# Patient Record
Sex: Male | Born: 1955 | Race: White | Hispanic: No | Marital: Married | Smoking: Former smoker
Health system: Southern US, Community
[De-identification: ages and names within clinical notes are randomized; demographics above are authoritative.]

## PROBLEM LIST (undated history)

## (undated) DIAGNOSIS — K219 Gastro-esophageal reflux disease without esophagitis: Secondary | ICD-10-CM

## (undated) DIAGNOSIS — T8859XA Other complications of anesthesia, initial encounter: Secondary | ICD-10-CM

## (undated) DIAGNOSIS — E785 Hyperlipidemia, unspecified: Secondary | ICD-10-CM

## (undated) DIAGNOSIS — Z85828 Personal history of other malignant neoplasm of skin: Secondary | ICD-10-CM

## (undated) DIAGNOSIS — I499 Cardiac arrhythmia, unspecified: Secondary | ICD-10-CM

## (undated) DIAGNOSIS — E7800 Pure hypercholesterolemia, unspecified: Secondary | ICD-10-CM

## (undated) DIAGNOSIS — M5416 Radiculopathy, lumbar region: Secondary | ICD-10-CM

## (undated) DIAGNOSIS — M47814 Spondylosis without myelopathy or radiculopathy, thoracic region: Secondary | ICD-10-CM

## (undated) DIAGNOSIS — J309 Allergic rhinitis, unspecified: Secondary | ICD-10-CM

## (undated) DIAGNOSIS — M47817 Spondylosis without myelopathy or radiculopathy, lumbosacral region: Secondary | ICD-10-CM

## (undated) DIAGNOSIS — I10 Essential (primary) hypertension: Secondary | ICD-10-CM

## (undated) DIAGNOSIS — F329 Major depressive disorder, single episode, unspecified: Secondary | ICD-10-CM

## (undated) DIAGNOSIS — Z8489 Family history of other specified conditions: Secondary | ICD-10-CM

## (undated) DIAGNOSIS — D649 Anemia, unspecified: Secondary | ICD-10-CM

## (undated) DIAGNOSIS — M25579 Pain in unspecified ankle and joints of unspecified foot: Secondary | ICD-10-CM

## (undated) DIAGNOSIS — L570 Actinic keratosis: Secondary | ICD-10-CM

## (undated) DIAGNOSIS — R1084 Generalized abdominal pain: Secondary | ICD-10-CM

## (undated) DIAGNOSIS — M51369 Other intervertebral disc degeneration, lumbar region without mention of lumbar back pain or lower extremity pain: Secondary | ICD-10-CM

## (undated) DIAGNOSIS — R748 Abnormal levels of other serum enzymes: Secondary | ICD-10-CM

## (undated) DIAGNOSIS — I679 Cerebrovascular disease, unspecified: Secondary | ICD-10-CM

## (undated) DIAGNOSIS — K2100 Gastro-esophageal reflux disease with esophagitis, without bleeding: Secondary | ICD-10-CM

## (undated) DIAGNOSIS — G7112 Myotonia congenita: Secondary | ICD-10-CM

## (undated) DIAGNOSIS — Z860101 Personal history of adenomatous and serrated colon polyps: Secondary | ICD-10-CM

## (undated) DIAGNOSIS — F1011 Alcohol abuse, in remission: Secondary | ICD-10-CM

## (undated) DIAGNOSIS — I471 Supraventricular tachycardia, unspecified: Secondary | ICD-10-CM

## (undated) DIAGNOSIS — F419 Anxiety disorder, unspecified: Secondary | ICD-10-CM

## (undated) DIAGNOSIS — E119 Type 2 diabetes mellitus without complications: Secondary | ICD-10-CM

## (undated) DIAGNOSIS — M1990 Unspecified osteoarthritis, unspecified site: Secondary | ICD-10-CM

## (undated) DIAGNOSIS — M47812 Spondylosis without myelopathy or radiculopathy, cervical region: Secondary | ICD-10-CM

## (undated) DIAGNOSIS — C801 Malignant (primary) neoplasm, unspecified: Secondary | ICD-10-CM

## (undated) HISTORY — PX: NOSE SURGERY: SHX723

## (undated) HISTORY — PX: BACK SURGERY: SHX140

## (undated) HISTORY — DX: Supraventricular tachycardia, unspecified: I47.10

## (undated) HISTORY — DX: Essential (primary) hypertension: I10

## (undated) HISTORY — DX: Malignant (primary) neoplasm, unspecified: C80.1

## (undated) HISTORY — DX: Abnormal levels of other serum enzymes: R74.8

## (undated) HISTORY — DX: Spondylosis without myelopathy or radiculopathy, thoracic region: M47.814

## (undated) HISTORY — DX: Anemia, unspecified: D64.9

## (undated) HISTORY — DX: Spondylosis without myelopathy or radiculopathy, cervical region: M47.812

## (undated) HISTORY — DX: Other intervertebral disc degeneration, lumbar region without mention of lumbar back pain or lower extremity pain: M51.369

## (undated) HISTORY — DX: Generalized abdominal pain: R10.84

## (undated) HISTORY — DX: Cerebrovascular disease, unspecified: I67.9

## (undated) HISTORY — DX: Radiculopathy, lumbar region: M54.16

## (undated) HISTORY — DX: Personal history of other malignant neoplasm of skin: Z85.828

## (undated) HISTORY — DX: Cardiac arrhythmia, unspecified: I49.9

## (undated) HISTORY — DX: Hyperlipidemia, unspecified: E78.5

## (undated) HISTORY — DX: Anxiety disorder, unspecified: F41.9

## (undated) HISTORY — DX: Major depressive disorder, single episode, unspecified: F32.9

## (undated) HISTORY — DX: Gastro-esophageal reflux disease without esophagitis: K21.9

## (undated) HISTORY — DX: Type 2 diabetes mellitus without complications: E11.9

## (undated) HISTORY — DX: Unspecified osteoarthritis, unspecified site: M19.90

## (undated) HISTORY — DX: Spondylosis without myelopathy or radiculopathy, lumbosacral region: M47.817

## (undated) HISTORY — DX: Allergic rhinitis, unspecified: J30.9

## (undated) HISTORY — DX: Pure hypercholesterolemia, unspecified: E78.00

## (undated) HISTORY — DX: Pain in unspecified ankle and joints of unspecified foot: M25.579

## (undated) HISTORY — DX: Alcohol abuse, in remission: F10.11

## (undated) HISTORY — DX: Personal history of adenomatous and serrated colon polyps: Z86.0101

## (undated) HISTORY — PX: CARPAL TUNNEL RELEASE: SHX101

## (undated) HISTORY — DX: Gastro-esophageal reflux disease with esophagitis, without bleeding: K21.00

## (undated) HISTORY — DX: Actinic keratosis: L57.0

---

## 2015-07-31 DIAGNOSIS — M48061 Spinal stenosis, lumbar region without neurogenic claudication: Secondary | ICD-10-CM

## 2015-07-31 HISTORY — DX: Spinal stenosis, lumbar region without neurogenic claudication: M48.061

## 2015-08-30 DIAGNOSIS — G7112 Myotonia congenita: Secondary | ICD-10-CM

## 2015-08-30 HISTORY — DX: Myotonia congenita: G71.12

## 2015-12-26 ENCOUNTER — Ambulatory Visit (INDEPENDENT_AMBULATORY_CARE_PROVIDER_SITE_OTHER): Payer: No Typology Code available for payment source | Admitting: Orthopaedic Surgery

## 2015-12-26 DIAGNOSIS — M1712 Unilateral primary osteoarthritis, left knee: Secondary | ICD-10-CM | POA: Diagnosis not present

## 2015-12-26 DIAGNOSIS — M1711 Unilateral primary osteoarthritis, right knee: Secondary | ICD-10-CM | POA: Diagnosis not present

## 2016-01-03 ENCOUNTER — Ambulatory Visit (INDEPENDENT_AMBULATORY_CARE_PROVIDER_SITE_OTHER): Payer: No Typology Code available for payment source | Admitting: Orthopaedic Surgery

## 2016-01-03 ENCOUNTER — Encounter (INDEPENDENT_AMBULATORY_CARE_PROVIDER_SITE_OTHER): Payer: Self-pay | Admitting: Orthopaedic Surgery

## 2016-01-03 DIAGNOSIS — M1712 Unilateral primary osteoarthritis, left knee: Secondary | ICD-10-CM | POA: Diagnosis not present

## 2016-01-03 DIAGNOSIS — M170 Bilateral primary osteoarthritis of knee: Secondary | ICD-10-CM

## 2016-01-03 DIAGNOSIS — M1711 Unilateral primary osteoarthritis, right knee: Secondary | ICD-10-CM

## 2016-01-03 HISTORY — DX: Bilateral primary osteoarthritis of knee: M17.0

## 2016-01-03 MED ORDER — SODIUM HYALURONATE (VISCOSUP) 20 MG/2ML IX SOSY
20.0000 mg | PREFILLED_SYRINGE | INTRA_ARTICULAR | Status: AC | PRN
  Administered 2016-01-03: 20 mg via INTRA_ARTICULAR

## 2016-01-03 MED ORDER — LIDOCAINE HCL 1 % IJ SOLN
1.0000 mL | INTRAMUSCULAR | Status: AC | PRN
  Administered 2016-01-03: 1 mL

## 2016-01-03 NOTE — Progress Notes (Signed)
Office Visit Note   Patient: Danny Hampton           Date of Birth: [birth year]           MRN: [id number] Visit Date: 01/03/2016              Requested by: No referring provider defined for this encounter. PCP: No primary care provider on file.   Assessment & Plan: Visit Diagnoses:  1. Bilateral primary osteoarthritis of knee     Plan: Return 1 week for third Euflex injection right and left knee  Follow-Up Instructions: Return in about 1 week (around 01/10/2016).   Orders:  No orders of the defined types were placed in this encounter.  No orders of the defined types were placed in this encounter.     Procedures: Large Joint Inj Date/Time: 01/03/2016 10:51 AM Performed by: Marybelle Killings Authorized by: Rodell Perna C   Consent Given by:  Patient Indications:  Pain and joint swelling Location:  Knee Site:  R knee Needle Size:  22 G Needle Length:  1.5 inches Approach:  Anterolateral Ultrasound Guidance: No   Fluoroscopic Guidance: No   Arthrogram: No Medications:  20 mg Sodium Hyaluronate 20 MG/2ML; 1 mL lidocaine 1 % Aspiration Attempted: No   Patient tolerance:  Patient tolerated the procedure well with no immediate complications Large Joint Inj Date/Time: 01/03/2016 10:52 AM Performed by: Marybelle Killings Authorized by: Rodell Perna C   Consent Given by:  Patient Indications:  Pain and joint swelling Location:  Knee Site:  L knee Needle Size:  22 G Needle Length:  1.5 inches Approach:  Anterolateral Ultrasound Guidance: No   Fluoroscopic Guidance: No   Arthrogram: No Medications:  1 mL lidocaine 1 %; 20 mg Sodium Hyaluronate 20 MG/2ML Patient tolerance:  Patient tolerated the procedure well with no immediate complications     Clinical Data: No additional findings.   Subjective: Chief Complaint  Patient presents with  . Right Knee - Pain, Follow-up  . Left Knee - Pain, Follow-up    Patient returns for Euflexxa injections in bilateral knees.  Injections 8 days ago have done okay. Left knee seems a little better, but the right knee, which is the worst, is still bothering him. Patient had no complications after first injections.     Review of Systems reviewed.    Objective: Vital Signs: BP 119/82   Pulse 68   Ht 5\' 7"  (1.702 m)   Wt 165 lb (74.8 kg)   BMI 25.84 kg/m   Physical Exam  Constitutional: He is oriented to person, place, and time. He appears well-developed and well-nourished.  HENT:  Head: Normocephalic and atraumatic.  Eyes: EOM are normal. Pupils are equal, round, and reactive to light.  Neck: No tracheal deviation present. No thyromegaly present.  Cardiovascular: Normal rate.   Pulmonary/Chest: Effort normal. He has no wheezes.  Abdominal: Soft. Bowel sounds are normal.  Neurological: He is alert and oriented to person, place, and time.  Skin: Skin is warm and dry. Capillary refill takes less than 2 seconds.  Psychiatric: He has a normal mood and affect. His behavior is normal. Judgment and thought content normal.    Ortho Exam [place] effusion with crepitus with range of motion he is here for second Euflex injection which was performed  Specialty Comments:  No specialty comments available.  Imaging: No results found.   PMFS History: Patient Active Problem List   Diagnosis Date Noted  . Bilateral primary osteoarthritis of  knee 01/03/2016   Past Medical History:  Diagnosis Date  . Acid reflux   . Anxiety   . Arthritis   . Cancer ([place])    skin cancer  . Diabetes mellitus without complication ([place])   . Hypertension     No family history on file.  Past Surgical History:  Procedure Laterality Date  . CARPAL TUNNEL RELEASE    . NOSE SURGERY     Social History   Occupational History  . retired Clinical biochemist    Social History Main Topics  . Smoking status: Current Every Day Smoker    Packs/day: 0.50    Years: 30.00    Types: Cigarettes  . Smokeless tobacco: Never Used  . Alcohol use  Yes     Comment: occasional drink  . Drug use: No  . Sexual activity: Not on file

## 2016-01-10 ENCOUNTER — Ambulatory Visit (INDEPENDENT_AMBULATORY_CARE_PROVIDER_SITE_OTHER): Payer: No Typology Code available for payment source | Admitting: Orthopaedic Surgery

## 2016-01-10 ENCOUNTER — Encounter (INDEPENDENT_AMBULATORY_CARE_PROVIDER_SITE_OTHER): Payer: Self-pay | Admitting: Orthopaedic Surgery

## 2016-01-10 VITALS — Ht 67.0 in | Wt 165.0 lb

## 2016-01-10 DIAGNOSIS — M170 Bilateral primary osteoarthritis of knee: Secondary | ICD-10-CM | POA: Diagnosis not present

## 2016-01-10 NOTE — Progress Notes (Signed)
   Office Visit Note   Patient: Danny Hampton           Date of Birth: 1955/09/19           MRN: [id number] Visit Date: 01/10/2016              Requested by: No referring provider defined for this encounter. PCP: No primary care provider on file.   Assessment & Plan: Visit Diagnoses:  1. Bilateral primary osteoarthritis of knee     Plan   Patient will return as needed seen Dr. Durward Fortes. This was his third reflex injection right and left knee which he tolerated well.  Follow-Up Instructions: Return if symptoms worsen or fail to improve.   Orders:  No orders of the defined types were placed in this encounter.  No orders of the defined types were placed in this encounter.     Procedures: No procedures performed   after a Betadine prep sterile technique bilateral knee flex injection was performed one in the right knee 1 and the left which he tolerated well.   Clinical Data: No additional findings.   Subjective: Chief Complaint  Patient presents with  . Left Knee - Pain  . Right Knee - Pain    Patient returns for bilateral Euflexxa injections #3.    Review of Systems unchanged   Objective: Vital Signs: Ht 5\' 7"  (1.702 m)   Wt 165 lb (74.8 kg)   BMI 25.84 kg/m   Physical Exam unchanged  Ortho Exam patient has mild swelling both knees were tenderness the right and left knee. He's noticed improvement in the left knee after the second reflex injection.  Specialty Comments:  No specialty comments available.  Imaging: No results found.   PMFS History: Patient Active Problem List   Diagnosis Date Noted  . Bilateral primary osteoarthritis of knee 01/03/2016   Past Medical History:  Diagnosis Date  . Acid reflux   . Anxiety   . Arthritis   . Cancer ([place])    skin cancer  . Diabetes mellitus without complication ([place])   . Hypertension     History reviewed. No pertinent family history.  Past Surgical History:  Procedure Laterality Date  . CARPAL  TUNNEL RELEASE    . NOSE SURGERY     Social History   Occupational History  . retired Clinical biochemist    Social History Main Topics  . Smoking status: Current Every Day Smoker    Packs/day: 0.50    Years: 30.00    Types: Cigarettes  . Smokeless tobacco: Never Used  . Alcohol use Yes     Comment: occasional drink  . Drug use: No  . Sexual activity: Not on file

## 2016-05-07 ENCOUNTER — Ambulatory Visit (INDEPENDENT_AMBULATORY_CARE_PROVIDER_SITE_OTHER): Payer: No Typology Code available for payment source | Admitting: Orthopaedic Surgery

## 2016-05-07 DIAGNOSIS — G8929 Other chronic pain: Secondary | ICD-10-CM | POA: Diagnosis not present

## 2016-05-07 DIAGNOSIS — M25562 Pain in left knee: Secondary | ICD-10-CM

## 2016-05-07 DIAGNOSIS — M25561 Pain in right knee: Secondary | ICD-10-CM | POA: Diagnosis not present

## 2016-05-07 MED ORDER — LIDOCAINE HCL 1 % IJ SOLN
5.0000 mL | INTRAMUSCULAR | Status: AC | PRN
  Administered 2016-05-07: 5 mL

## 2016-05-07 MED ORDER — METHYLPREDNISOLONE ACETATE 40 MG/ML IJ SUSP
80.0000 mg | INTRAMUSCULAR | Status: AC | PRN
  Administered 2016-05-07: 80 mg

## 2016-05-07 MED ORDER — BUPIVACAINE HCL 0.5 % IJ SOLN
3.0000 mL | INTRAMUSCULAR | Status: AC | PRN
  Administered 2016-05-07: 3 mL via INTRA_ARTICULAR

## 2016-05-07 NOTE — Progress Notes (Signed)
Office Visit Note   Patient: Danny Hampton           Date of Birth: Jul 29, 1955           MRN: [id number] Visit Date: 05/07/2016              Requested by: No referring provider defined for this encounter. PCP: No primary care provider on file.   Assessment & Plan: Visit Diagnoses:  1. Chronic pain of left knee   2. Chronic pain of right knee     Plan: Mr. Adem has been followed for the osteoarthritis in both knees. he's already completed a course of Visco supplementation. He's had recurrent pain and is Asking for a cortisone injection bilaterally. I will plan on injecting both knees and see him back on a when necessary basis. Follow-Up Instructions: Return if symptoms worsen or fail to improve.   Orders:  No orders of the defined types were placed in this encounter.  No orders of the defined types were placed in this encounter.     Procedures: Large Joint Inj Date/Time: 05/07/2016 12:05 PM Performed by: Garald Balding Authorized by: Garald Balding   Consent Given by:  Patient Timeout: prior to procedure the correct patient, procedure, and site was verified   Indications:  Pain and joint swelling Location:  Knee Site:  L knee Prep: patient was prepped and draped in usual sterile fashion   Needle Size:  25 G Needle Length:  1.5 inches Approach:  Anteromedial Ultrasound Guidance: No   Fluoroscopic Guidance: No   Arthrogram: No   Medications:  3 mL bupivacaine 0.5 %; 5 mL lidocaine 1 %; 80 mg methylPREDNISolone acetate 40 MG/ML Aspiration Attempted: No   Patient tolerance:  Patient tolerated the procedure well with no immediate complications    Large Joint Inj Date/Time: 05/07/2016 12:08 PM Performed by: Garald Balding Authorized by: Garald Balding   Consent Given by:  Patient Timeout: prior to procedure the correct patient, procedure, and site was verified   Indications:  Pain and joint swelling Location:  Knee Site:  R knee Prep: patient  was prepped and draped in usual sterile fashion   Needle Size:  25 G Needle Length:  1.5 inches Approach:  Anteromedial Ultrasound Guidance: No   Fluoroscopic Guidance: No   Arthrogram: No   Medications:  5 mL lidocaine 1 %; 80 mg methylPREDNISolone acetate 40 MG/ML; 3 mL bupivacaine 0.5 % Aspiration Attempted: No   Patient tolerance:  Patient tolerated the procedure well with no immediate complications     Clinical Data: No additional findings.   Subjective: No chief complaint on file. Prior diagnosis of bilateral knee osteoarthritis. A course of Visco supplementation less than in 6 months ago and is having recurrent pain and wishes a cortisone injection.  HPI  Review of Systems   Objective: Vital Signs: There were no vitals taken for this visit.  Physical Exam  Ortho Exam examination of both knees reveals full extension and flexion over 105. No evidence of instability. No popliteal pain or fullness. No calf pain. Bilateral medial joint pain which is relatively mild at this point. Some patella crepitation.  Specialty Comments:  No specialty comments available.  Imaging: No results found.   PMFS History: Patient Active Problem List   Diagnosis Date Noted  . Bilateral primary osteoarthritis of knee 01/03/2016   Past Medical History:  Diagnosis Date  . Acid reflux   . Anxiety   . Arthritis   .  Cancer ([place])    skin cancer  . Diabetes mellitus without complication ([place])   . Hypertension     No family history on file.  Past Surgical History:  Procedure Laterality Date  . CARPAL TUNNEL RELEASE    . NOSE SURGERY     Social History   Occupational History  . retired Clinical biochemist    Social History Main Topics  . Smoking status: Current Every Day Smoker    Packs/day: 0.50    Years: 30.00    Types: Cigarettes  . Smokeless tobacco: Never Used  . Alcohol use Yes     Comment: occasional drink  . Drug use: No  . Sexual activity: Not on file

## 2016-05-14 ENCOUNTER — Ambulatory Visit (INDEPENDENT_AMBULATORY_CARE_PROVIDER_SITE_OTHER): Payer: No Typology Code available for payment source | Admitting: Orthopaedic Surgery

## 2016-06-18 ENCOUNTER — Ambulatory Visit (INDEPENDENT_AMBULATORY_CARE_PROVIDER_SITE_OTHER): Payer: No Typology Code available for payment source | Admitting: Orthopaedic Surgery

## 2016-06-18 ENCOUNTER — Encounter (INDEPENDENT_AMBULATORY_CARE_PROVIDER_SITE_OTHER): Payer: Self-pay | Admitting: Orthopaedic Surgery

## 2016-06-18 VITALS — BP 129/77 | HR 70 | Ht 67.0 in | Wt 170.0 lb

## 2016-06-18 DIAGNOSIS — M1711 Unilateral primary osteoarthritis, right knee: Secondary | ICD-10-CM

## 2016-06-18 DIAGNOSIS — G8929 Other chronic pain: Secondary | ICD-10-CM

## 2016-06-18 DIAGNOSIS — M1712 Unilateral primary osteoarthritis, left knee: Secondary | ICD-10-CM | POA: Diagnosis not present

## 2016-06-18 DIAGNOSIS — M25562 Pain in left knee: Principal | ICD-10-CM

## 2016-06-18 DIAGNOSIS — M25561 Pain in right knee: Principal | ICD-10-CM

## 2016-06-18 MED ORDER — SODIUM HYALURONATE (VISCOSUP) 20 MG/2ML IX SOSY
20.0000 mg | PREFILLED_SYRINGE | INTRA_ARTICULAR | Status: AC | PRN
  Administered 2016-06-18: 20 mg via INTRA_ARTICULAR

## 2016-06-18 NOTE — Progress Notes (Signed)
Office Visit Note   Patient: Danny Hampton           Date of Birth: Jul 09, 1955           MRN: [id number] Visit Date: 06/18/2016              Requested by: No referring provider defined for this encounter. PCP: No PCP Per Patient   Assessment & Plan: Visit Diagnoses:  1. Chronic pain of both knees   Prior diagnosis with cortisone and Visco supplementation over 6 months ago we will restart Euflexxa today  Plan: Return in 1 week for second Euflexxa injections both knees  Follow-Up Instructions: Return in about 1 week (around 06/25/2016).   Orders:  No orders of the defined types were placed in this encounter.  No orders of the defined types were placed in this encounter.     Procedures: Large Joint Inj Date/Time: 06/18/2016 2:13 PM Performed by: Garald Balding Authorized by: Garald Balding   Consent Given by:  Patient Timeout: prior to procedure the correct patient, procedure, and site was verified   Indications:  Pain and joint swelling Location:  Knee Site:  L knee Prep: patient was prepped and draped in usual sterile fashion   Needle Size:  25 G Needle Length:  1.5 inches Approach:  Anteromedial Ultrasound Guidance: No   Fluoroscopic Guidance: No   Arthrogram: No   Medications:  20 mg Sodium Hyaluronate 20 MG/2ML Aspiration Attempted: No   Patient tolerance:  Patient tolerated the procedure well with no immediate complications  Large Joint Inj Date/Time: 06/18/2016 2:13 PM Performed by: Garald Balding Authorized by: Garald Balding   Consent Given by:  Patient Timeout: prior to procedure the correct patient, procedure, and site was verified   Indications:  Pain and joint swelling Location:  Knee Site:  R knee Prep: patient was prepped and draped in usual sterile fashion   Needle Size:  25 G Needle Length:  1.5 inches Approach:  Anteromedial Ultrasound Guidance: No   Fluoroscopic Guidance: No   Arthrogram: No   Medications:  20 mg Sodium  Hyaluronate 20 MG/2ML Aspiration Attempted: No   Patient tolerance:  Patient tolerated the procedure well with no immediate complications     Clinical Data: No additional findings.   Subjective: Chief Complaint  Patient presents with  . Right Knee - Injections    euflexxa #1  . Left Knee - Injections    Euflexxa #1  Mr. Fackler has been approved for bilateral Euflexxa injections both knees. He's had a prior course over 6 months ago with good relief of his pain. Denies fever or chills or recent injury or trauma.  HPI  Review of Systems   Objective: Vital Signs: BP 129/77   Pulse 70   Ht 5\' 7"  (1.702 m)   Wt 170 lb (77.1 kg)   BMI 26.63 kg/m   Physical Exam  Ortho Exam mild medial joint pain both knees. Full extension. Neither knee was hot warm or red. No effusion. Flexion over 110 without instability. No calf pain no swelling distally  Specialty Comments:  No specialty comments available.  Imaging: No results found.   PMFS History: Patient Active Problem List   Diagnosis Date Noted  . Bilateral primary osteoarthritis of knee 01/03/2016   Past Medical History:  Diagnosis Date  . Acid reflux   . Anxiety   . Arthritis   . Cancer ([place])    skin cancer  . Diabetes mellitus without complication (  [place])   . Hypertension     History reviewed. No pertinent family history.  Past Surgical History:  Procedure Laterality Date  . CARPAL TUNNEL RELEASE    . NOSE SURGERY     Social History   Occupational History  . retired Clinical biochemist    Social History Main Topics  . Smoking status: Current Every Day Smoker    Packs/day: 0.50    Years: 30.00    Types: Cigarettes  . Smokeless tobacco: Never Used  . Alcohol use Yes     Comment: occasional drink  . Drug use: No  . Sexual activity: Not on file

## 2016-06-25 ENCOUNTER — Ambulatory Visit (INDEPENDENT_AMBULATORY_CARE_PROVIDER_SITE_OTHER): Payer: No Typology Code available for payment source | Admitting: Orthopaedic Surgery

## 2016-06-25 ENCOUNTER — Encounter (INDEPENDENT_AMBULATORY_CARE_PROVIDER_SITE_OTHER): Payer: Self-pay | Admitting: Orthopaedic Surgery

## 2016-06-25 VITALS — BP 121/78 | HR 78 | Ht 68.0 in | Wt 165.0 lb

## 2016-06-25 DIAGNOSIS — M25562 Pain in left knee: Principal | ICD-10-CM

## 2016-06-25 DIAGNOSIS — G8929 Other chronic pain: Secondary | ICD-10-CM

## 2016-06-25 DIAGNOSIS — M1712 Unilateral primary osteoarthritis, left knee: Secondary | ICD-10-CM

## 2016-06-25 DIAGNOSIS — M1711 Unilateral primary osteoarthritis, right knee: Secondary | ICD-10-CM

## 2016-06-25 DIAGNOSIS — M25561 Pain in right knee: Principal | ICD-10-CM

## 2016-06-25 MED ORDER — SODIUM HYALURONATE (VISCOSUP) 20 MG/2ML IX SOSY
20.0000 mg | PREFILLED_SYRINGE | INTRA_ARTICULAR | Status: AC | PRN
  Administered 2016-06-25: 20 mg via INTRA_ARTICULAR

## 2016-06-25 MED ORDER — LIDOCAINE HCL 1 % IJ SOLN
3.0000 mL | INTRAMUSCULAR | Status: AC | PRN
  Administered 2016-06-25: 3 mL

## 2016-06-25 NOTE — Progress Notes (Signed)
Office Visit Note   Patient: Danny Hampton           Date of Birth: September 19, 1955           MRN: [id number] Visit Date: 06/25/2016              Requested by: No referring provider defined for this encounter. PCP: No PCP Per Patient   Assessment & Plan: Visit Diagnoses:  1. Chronic pain of both knees    Chronic osteoarthritis both knees Plan: Second Euflexxa injections both knees. Return in 1 week, complete series  Follow-Up Instructions: Return in about 1 week (around 07/02/2016).   Orders:  No orders of the defined types were placed in this encounter.  No orders of the defined types were placed in this encounter.     Procedures: Large Joint Inj Date/Time: 06/25/2016 11:34 AM Performed by: Garald Balding Authorized by: Garald Balding   Consent Given by:  Patient Timeout: prior to procedure the correct patient, procedure, and site was verified   Indications:  Pain and joint swelling Location:  Knee Site:  L knee Prep: patient was prepped and draped in usual sterile fashion   Needle Size:  25 G Needle Length:  1.5 inches Approach:  Anteromedial Ultrasound Guidance: No   Fluoroscopic Guidance: No   Arthrogram: No   Medications:  20 mg Sodium Hyaluronate 20 MG/2ML; 3 mL lidocaine 1 % Aspiration Attempted: No   Patient tolerance:  Patient tolerated the procedure well with no immediate complications  Large Joint Inj Date/Time: 06/25/2016 11:34 AM Performed by: Garald Balding Authorized by: Garald Balding   Consent Given by:  Patient Timeout: prior to procedure the correct patient, procedure, and site was verified   Indications:  Pain and joint swelling Location:  Knee Site:  R knee Prep: patient was prepped and draped in usual sterile fashion   Needle Size:  25 G Needle Length:  1.5 inches Approach:  Anteromedial Ultrasound Guidance: No   Fluoroscopic Guidance: No   Arthrogram: No   Medications:  3 mL lidocaine 1 %; 20 mg Sodium Hyaluronate 20  MG/2ML Aspiration Attempted: No   Patient tolerance:  Patient tolerated the procedure well with no immediate complications     Clinical Data: No additional findings.   Subjective: Chief Complaint  Patient presents with  . Left Knee - Injections    #2 Euflexxa  . Right Knee - Injections    #2 Euflexxa   Mr. Crean relates that he's not had any problem with either knee from the first Euflexxa injection last week HPI  Review of Systems   Objective: Vital Signs: BP 121/78   Pulse 78   Ht 5\' 8"  (1.727 m)   Wt 165 lb (74.8 kg)   BMI 25.09 kg/m   Physical Exam  Ortho Exam both knees without evidence of effusion. Neither is hot warm red or swollen. No localized areas of tenderness.  Specialty Comments:  No specialty comments available.  Imaging: No results found.   PMFS History: Patient Active Problem List   Diagnosis Date Noted  . Bilateral primary osteoarthritis of knee 01/03/2016   Past Medical History:  Diagnosis Date  . Acid reflux   . Anxiety   . Arthritis   . Cancer ([place])    skin cancer  . Diabetes mellitus without complication ([place])   . Hypertension     No family history on file.  Past Surgical History:  Procedure Laterality Date  . CARPAL TUNNEL RELEASE    .  NOSE SURGERY     Social History   Occupational History  . retired Clinical biochemist    Social History Main Topics  . Smoking status: Current Every Day Smoker    Packs/day: 0.50    Years: 30.00    Types: Cigarettes  . Smokeless tobacco: Never Used  . Alcohol use Yes     Comment: occasional drink  . Drug use: No  . Sexual activity: Not on file

## 2016-07-02 ENCOUNTER — Ambulatory Visit (INDEPENDENT_AMBULATORY_CARE_PROVIDER_SITE_OTHER): Payer: No Typology Code available for payment source | Admitting: Orthopaedic Surgery

## 2016-07-02 DIAGNOSIS — M1712 Unilateral primary osteoarthritis, left knee: Secondary | ICD-10-CM

## 2016-07-02 DIAGNOSIS — M1711 Unilateral primary osteoarthritis, right knee: Secondary | ICD-10-CM | POA: Diagnosis not present

## 2016-07-02 DIAGNOSIS — G8929 Other chronic pain: Secondary | ICD-10-CM | POA: Diagnosis not present

## 2016-07-02 DIAGNOSIS — M25562 Pain in left knee: Principal | ICD-10-CM

## 2016-07-02 DIAGNOSIS — M25561 Pain in right knee: Principal | ICD-10-CM

## 2016-07-02 MED ORDER — LIDOCAINE HCL 1 % IJ SOLN
3.0000 mL | INTRAMUSCULAR | Status: AC | PRN
  Administered 2016-07-02: 3 mL

## 2016-07-02 MED ORDER — SODIUM HYALURONATE (VISCOSUP) 20 MG/2ML IX SOSY
20.0000 mg | PREFILLED_SYRINGE | INTRA_ARTICULAR | Status: AC | PRN
  Administered 2016-07-02: 20 mg via INTRA_ARTICULAR

## 2016-07-02 NOTE — Progress Notes (Signed)
Office Visit Note   Patient: Danny Hampton           Date of Birth: Jun 27, 1955           MRN: [id number] Visit Date: 07/02/2016              Requested by: No referring provider defined for this encounter. PCP: No PCP Per Patient   Assessment & Plan: Visit Diagnoses:  1. Chronic pain of both knees    Osteo arthritis both knees Plan: Third Euflexxa injection in both knees to complete the series  Follow-Up Instructions: Return if symptoms worsen or fail to improve.   Orders:  No orders of the defined types were placed in this encounter.  No orders of the defined types were placed in this encounter.     Procedures: Large Joint Inj Date/Time: 07/02/2016 2:14 PM Performed by: Garald Balding Authorized by: Garald Balding   Consent Given by:  Patient Timeout: prior to procedure the correct patient, procedure, and site was verified   Indications:  Pain and joint swelling Location:  Knee Site:  L knee Prep: patient was prepped and draped in usual sterile fashion   Needle Size:  25 G Needle Length:  1.5 inches Approach:  Anteromedial Ultrasound Guidance: No   Fluoroscopic Guidance: No   Arthrogram: No   Medications:  20 mg Sodium Hyaluronate 20 MG/2ML; 3 mL lidocaine 1 % Aspiration Attempted: No   Patient tolerance:  Patient tolerated the procedure well with no immediate complications  Large Joint Inj Date/Time: 07/02/2016 2:15 PM Performed by: Garald Balding Authorized by: Garald Balding   Consent Given by:  Patient Timeout: prior to procedure the correct patient, procedure, and site was verified   Indications:  Pain and joint swelling Location:  Knee Site:  R knee Prep: patient was prepped and draped in usual sterile fashion   Needle Size:  25 G Needle Length:  1.5 inches Approach:  Anteromedial Ultrasound Guidance: No   Fluoroscopic Guidance: No   Arthrogram: No   Medications:  3 mL lidocaine 1 %; 20 mg Sodium Hyaluronate 20 MG/2ML Aspiration  Attempted: No   Patient tolerance:  Patient tolerated the procedure well with no immediate complications     Clinical Data: No additional findings.   Subjective: Chief Complaint  Patient presents with  . Left Knee - Injections    Mr. Lich is feeling much better since #2 euflexxa injection  . Right Knee - Injections    HPI  Review of Systems   Objective: Vital Signs: There were no vitals taken for this visit.  Physical Exam  Ortho Exam no effusion either knee. Full extension and flexion. No limp. No distal edema. Skin intact  Specialty Comments:  No specialty comments available.  Imaging: No results found.   PMFS History: Patient Active Problem List   Diagnosis Date Noted  . Bilateral primary osteoarthritis of knee 01/03/2016   Past Medical History:  Diagnosis Date  . Acid reflux   . Anxiety   . Arthritis   . Cancer ([place])    skin cancer  . Diabetes mellitus without complication ([place])   . Hypertension     No family history on file.  Past Surgical History:  Procedure Laterality Date  . CARPAL TUNNEL RELEASE    . NOSE SURGERY     Social History   Occupational History  . retired Clinical biochemist    Social History Main Topics  . Smoking status: Current Every Day Smoker  Packs/day: 0.50    Years: 30.00    Types: Cigarettes  . Smokeless tobacco: Never Used  . Alcohol use Yes     Comment: occasional drink  . Drug use: No  . Sexual activity: Not on file     Garald Balding, MD   Note - This record has been created using [employer].  Chart creation errors have been sought, but may not always  have been located. Such creation errors do not reflect on  the standard of medical care.

## 2016-07-02 NOTE — Progress Notes (Signed)
   Procedure Note  Patient: Danny Hampton             Date of Birth: December 17, 1955           MRN: [id number]             Visit Date: 07/02/2016  Procedures: Visit Diagnoses: Chronic pain of both knees  No procedures performed

## 2016-11-19 ENCOUNTER — Ambulatory Visit (INDEPENDENT_AMBULATORY_CARE_PROVIDER_SITE_OTHER): Payer: No Typology Code available for payment source | Admitting: Orthopedic Surgery

## 2016-11-19 ENCOUNTER — Encounter (INDEPENDENT_AMBULATORY_CARE_PROVIDER_SITE_OTHER): Payer: Self-pay | Admitting: Orthopedic Surgery

## 2016-11-19 VITALS — BP 120/78 | HR 113 | Resp 14 | Ht 67.0 in | Wt 175.0 lb

## 2016-11-19 DIAGNOSIS — M1712 Unilateral primary osteoarthritis, left knee: Secondary | ICD-10-CM

## 2016-11-19 DIAGNOSIS — M1711 Unilateral primary osteoarthritis, right knee: Secondary | ICD-10-CM | POA: Diagnosis not present

## 2016-11-19 MED ORDER — METHYLPREDNISOLONE ACETATE 40 MG/ML IJ SUSP
80.0000 mg | INTRAMUSCULAR | Status: AC | PRN
  Administered 2016-11-19: 80 mg

## 2016-11-19 MED ORDER — BUPIVACAINE HCL 0.5 % IJ SOLN
3.0000 mL | INTRAMUSCULAR | Status: AC | PRN
  Administered 2016-11-19: 3 mL via INTRA_ARTICULAR

## 2016-11-19 MED ORDER — LIDOCAINE HCL 1 % IJ SOLN
3.0000 mL | INTRAMUSCULAR | Status: AC | PRN
  Administered 2016-11-19: 3 mL

## 2016-11-19 NOTE — Progress Notes (Addendum)
Office Visit Note   Patient: Danny Hampton           Date of Birth: 04/17/1955           MRN: [id number] Visit Date: 11/19/2016              Requested by: No referring provider defined for this encounter. PCP: Patient, No Pcp Per   Assessment & Plan: Visit Diagnoses:  1. Unilateral primary osteoarthritis, left knee   2. Unilateral primary osteoarthritis, right knee     Plan:  #1: Corticosteroid injection to both knees, she dramatically #2: Told him to check his glucose several times today and if elevated then we'll need to cause medical doctor. He does have metformin at home.  Follow-Up Instructions: Return if symptoms worsen or fail to improve.   Orders:  No orders of the defined types were placed in this encounter.  No orders of the defined types were placed in this encounter.     Procedures: Large Joint Inj Date/Time: 11/19/2016 4:01 PM Performed by: Biagio Borg D Authorized by: Biagio Borg D   Consent Given by:  Patient Timeout: prior to procedure the correct patient, procedure, and site was verified   Indications:  Pain and joint swelling Location:  Knee Site:  L knee Prep: patient was prepped and draped in usual sterile fashion   Needle Size:  25 G Needle Length:  1.5 inches Approach:  Anteromedial Ultrasound Guidance: No   Fluoroscopic Guidance: No   Arthrogram: No   Medications:  80 mg methylPREDNISolone acetate 40 MG/ML; 3 mL bupivacaine 0.5 %; 3 mL lidocaine 1 % Aspiration Attempted: No   Patient tolerance:  Patient tolerated the procedure well with no immediate complications Large Joint Inj Date/Time: 11/19/2016 4:02 PM Performed by: Biagio Borg D Authorized by: Biagio Borg D   Consent Given by:  Patient Timeout: prior to procedure the correct patient, procedure, and site was verified   Indications:  Pain and joint swelling Location:  Knee Site:  R knee Prep: patient was prepped and draped in usual sterile fashion   Needle Size:   25 G Needle Length:  1.5 inches Approach:  Anteromedial Ultrasound Guidance: No   Fluoroscopic Guidance: No   Arthrogram: No   Medications:  80 mg methylPREDNISolone acetate 40 MG/ML; 3 mL bupivacaine 0.5 %; 3 mL lidocaine 1 % Aspiration Attempted: No   Patient tolerance:  Patient tolerated the procedure well with no immediate complications     Clinical Data: No additional findings.   Subjective: Chief Complaint  Patient presents with  . Left Knee - Pain    Danny Hampton is a 26 y o that presents with chronic knee pain . Hx of Euflexxa injections in April  . Right Knee - Pain    Pt relates his ankles and feet hurt all the time    HPI  Danny Hampton is a very pleasant 61 year old white male who is seen today for evaluation of both knees. He's had a history of bilateral osteoarthritis of the knees and has undergone multiple cortisone injections as well as visco supplementation.   Review of Systems  Musculoskeletal: Positive for back pain, neck pain and neck stiffness.     Objective: Vital Signs: BP 120/78   Pulse (!) 113   Resp 14   Ht 5\' 7"  (1.702 m)   Wt 175 lb (79.4 kg)   BMI 27.41 kg/m   Physical Exam  Constitutional: He is oriented to person, place, and time. He appears  well-developed and well-nourished.  HENT:  Head: Normocephalic and atraumatic.  Eyes: Pupils are equal, round, and reactive to light. EOM are normal.  Pulmonary/Chest: Effort normal.  Neurological: He is alert and oriented to person, place, and time.  Skin: Skin is warm and dry.  Psychiatric: He has a normal mood and affect. His behavior is normal. Judgment and thought content normal.    Ortho Exam   hehas mild swelling both knees with tenderness about the joint linesof the right and left knee.  Does have some crepitus with range of motion.  Specialty Comments:  No specialty comments available.  Imaging: No results found.   PMFS History: Patient Active Problem List   Diagnosis Date  Noted  . Bilateral primary osteoarthritis of knee 01/03/2016   Past Medical History:  Diagnosis Date  . Acid reflux   . Anxiety   . Arthritis   . Cancer ([place])    skin cancer  . Diabetes mellitus without complication ([place])   . Hypertension     History reviewed. No pertinent family history.  Past Surgical History:  Procedure Laterality Date  . CARPAL TUNNEL RELEASE    . NOSE SURGERY     Social History   Occupational History  . retired Clinical biochemist    Social History Main Topics  . Smoking status: Current Every Day Smoker    Packs/day: 0.50    Years: 30.00    Types: Cigarettes  . Smokeless tobacco: Never Used  . Alcohol use Yes     Comment: occasional drink  . Drug use: No  . Sexual activity: Not on file

## 2018-01-27 ENCOUNTER — Encounter (INDEPENDENT_AMBULATORY_CARE_PROVIDER_SITE_OTHER): Payer: Self-pay | Admitting: Orthopaedic Surgery

## 2018-01-27 ENCOUNTER — Ambulatory Visit (INDEPENDENT_AMBULATORY_CARE_PROVIDER_SITE_OTHER): Payer: PRIVATE HEALTH INSURANCE | Admitting: Orthopaedic Surgery

## 2018-01-27 VITALS — BP 111/76 | HR 90 | Ht 67.0 in | Wt 180.0 lb

## 2018-01-27 DIAGNOSIS — G8929 Other chronic pain: Secondary | ICD-10-CM | POA: Diagnosis not present

## 2018-01-27 DIAGNOSIS — M25511 Pain in right shoulder: Secondary | ICD-10-CM

## 2018-01-27 NOTE — Progress Notes (Signed)
Office Visit Note   Patient: Danny Hampton           Date of Birth: 05-May-1955           MRN: [id number] Visit Date: 01/27/2018              Requested by: No referring provider defined for this encounter. PCP: Patient, No Pcp Per   Assessment & Plan: Visit Diagnoses:  1. Chronic right shoulder pain     Plan: Impingement syndrome right shoulder with evidence of calcific tendinitis.  Presently very comfortable and essentially asymptomatic.  Long discussion regarding diagnosis and treatment options over time.  Presently he is fine.  All questions were answered Follow-Up Instructions: Return if symptoms worsen or fail to improve.   Orders:  No orders of the defined types were placed in this encounter.  No orders of the defined types were placed in this encounter.     Procedures: No procedures performed   Clinical Data: No additional findings.   Subjective: Chief Complaint  Patient presents with  . Follow-up    11/13/17 R SHOULDER PAIN CANT RAISE OVER HEAD, LIFTING HURTS TAKING MELOXICAM, IBUPROFEN AND TYLENOL  Mr. Smeltz is 62 years old retired and seen for another opinion regarding a problem with his right shoulder.  He is been followed through the [place] system in [place] for his right shoulder pain.  He was told that he might have some "calcium" in his shoulder and to take Tylenol.  He was also prescribed meloxicam as an NSAID.  At one point he had a significant amount of pain but presently is not having that much trouble.  He has had x-rays through the [place] system there is a copy of report from January 2018 where he had some mild degenerative changes at the glenohumeral joint and narrowing at the acromioclavicular joint.  He also had 9 mm of calcification at the insertion of the supraspinatus tendon.  Being careful with his activities and notes that he can control any pain with heat or ice and medicines.  He has not had any numbness or tingling  HPI  Review of Systems    Constitutional: Negative for fatigue and fever.  HENT: Negative for ear pain.   Eyes: Negative for pain.  Respiratory: Negative for cough and shortness of breath.   Cardiovascular: Negative for leg swelling.  Gastrointestinal: Negative for constipation and diarrhea.  Genitourinary: Negative for difficulty urinating.  Musculoskeletal: Negative for back pain and neck pain.  Skin: Negative for rash.  Allergic/Immunologic: Negative for food allergies.  Neurological: Positive for weakness. Negative for numbness.  Psychiatric/Behavioral: Positive for sleep disturbance.     Objective: Vital Signs: BP 111/76 (BP Location: Left Arm, Patient Position: Sitting, Cuff Size: Normal)   Pulse 90   Ht 5\' 7"  (1.702 m)   Wt 180 lb (81.6 kg)   BMI 28.19 kg/m   Physical Exam  Constitutional: He is oriented to person, place, and time. He appears well-developed and well-nourished.  HENT:  Mouth/Throat: Oropharynx is clear and moist.  Eyes: Pupils are equal, round, and reactive to light. EOM are normal.  Pulmonary/Chest: Effort normal.  Neurological: He is alert and oriented to person, place, and time.  Skin: Skin is warm and dry.  Psychiatric: He has a normal mood and affect. His behavior is normal.    Ortho Exam awake alert and oriented x3.  Comfortable sitting.  Full range of motion of right shoulder with minimally positive impingement testing in the extreme  of external rotation.  No evidence of adhesive capsulitis or instability.  No tenderness over the acromioclavicular joint or over the supraspinatus tendon insertion in the area of the greater tuberosity.  No biceps pain.  Biceps intact.  Good grip and good release.  Range of motion of the cervical spine Specialty Comments:  No specialty comments available.  Imaging: No results found.   PMFS History: Patient Active Problem List   Diagnosis Date Noted  . Bilateral primary osteoarthritis of knee 01/03/2016   Past Medical History:   Diagnosis Date  . Acid reflux   . Anxiety   . Arthritis   . Cancer ([place])    skin cancer  . Diabetes mellitus without complication ([place])   . Hypertension     History reviewed. No pertinent family history.  Past Surgical History:  Procedure Laterality Date  . CARPAL TUNNEL RELEASE    . NOSE SURGERY     Social History   Occupational History  . Occupation: retired Clinical biochemist  Tobacco Use  . Smoking status: Current Every Day Smoker    Packs/day: 0.50    Years: 30.00    Pack years: 15.00    Types: Cigarettes  . Smokeless tobacco: Never Used  Substance and Sexual Activity  . Alcohol use: Yes    Comment: occasional drink  . Drug use: No  . Sexual activity: Not on file

## 2018-04-21 ENCOUNTER — Encounter (INDEPENDENT_AMBULATORY_CARE_PROVIDER_SITE_OTHER): Payer: Self-pay | Admitting: Orthopaedic Surgery

## 2018-04-21 ENCOUNTER — Ambulatory Visit (INDEPENDENT_AMBULATORY_CARE_PROVIDER_SITE_OTHER): Payer: PRIVATE HEALTH INSURANCE | Admitting: Orthopaedic Surgery

## 2018-04-21 VITALS — BP 122/88 | HR 99 | Ht 67.0 in | Wt 180.0 lb

## 2018-04-21 DIAGNOSIS — M542 Cervicalgia: Secondary | ICD-10-CM

## 2018-04-21 DIAGNOSIS — M170 Bilateral primary osteoarthritis of knee: Secondary | ICD-10-CM

## 2018-04-21 HISTORY — DX: Cervicalgia: M54.2

## 2018-04-21 NOTE — Progress Notes (Signed)
Office Visit Note   Patient: Danny Hampton           Date of Birth: 1955-08-11           MRN: [id number] Visit Date: 04/21/2018              Requested by: No referring provider defined for this encounter. PCP: Patient, No Pcp Per   Assessment & Plan: Visit Diagnoses: [name] visited the office today for evaluation of bilateral knee pain.[name] He has been evaluated in the past and is received cortisone injections in his both of his knees.  The last injections we gave her in September.  He has been followed up in the [place] in [place] and was recently told that he had to have his knee replaced he is having more trouble on the right than he is on the left.  Apparently the knee replacement was scheduled for December and then again for this coming March but he wanted another opinion.  He has x-rays which he brought with him on a disc which I reviewed.  The films were performed of both of his knees in the standing projection.  The joint spaces were well maintained.  There was a little irregularity along the medial compartment but no evidence of ectopic calcification.  I was really concerned that his arthritis was not at this stage that he required knee replacement and suggested we get an MRI scan to be sure there was not some other pathology that may account for his pain and that which might be treated with a lesser procedure.  He apparently was not happy with that opinion and walked out of the office. He does have history of chronic neck and back pain.  He has had prior back surgery through [facility].  He has had injections in the past which have not helped.  He tried tramadol but has not had much relief.  I know that he was disappointed that I would not proceed with a right knee replacement but I felt that it was difficult to justify the procedure based on his exam which is noted below and his x-rays.  I thought a more comprehensive evaluation with an MRI scan would be in his best  interest.   Follow-Up Instructions: Return if symptoms worsen or fail to improve.   Orders:  No orders of the defined types were placed in this encounter.  No orders of the defined types were placed in this encounter.     Procedures: No procedures performed   Clinical Data: No additional findings.   Subjective: Chief Complaint  Patient presents with  . Right Knee - Pain  . Left Knee - Pain  Patient presents today with bilateral knee pain. The right is greater than the left. Patient states that he is experiencing constant pain and grinding. He states that his pain wakes him at night. He went to the [place] and had x-rays in Oct 2019, which patient brought with him today. He wants to talk about knee arthroplasty. He has tried injections and visco in the past.  [name] relates that he is had been scheduled for a right knee replacement through the [place] system in [place].  The operating surgeon apparently had left the system and was now in private practice.  He is seeking another opinion regarding the problem with his right knee.  I have seen him in the past injecting both of his knees with evidence of osteoarthritis.  He did bring films  of his knees on a disc today.  I reviewed these.  I thought the arthritis was very minimal with excellent maintenance of the joint spaces and no deformity. He does have chronic problem with his neck and his back being treated through a pain clinic.  He presently is not taking any medicines other than over-the-counter pills.  I have evaluated his hips in the past and did not think there was any significant arthritis on the pelvic films.  Physical Exam Constitutional:      Appearance: He is well-developed.  Eyes:     Pupils: Pupils are equal, round, and reactive to light.  Pulmonary:     Effort: Pulmonary effort is normal.  Skin:    General: Skin is warm and dry.  Neurological:     Mental Status: He is alert and oriented to person, place, and  time.  Psychiatric:        Behavior: Behavior normal.     Ortho Exam awake alert knee was not hot red warm or swollen.  No effusion.  He did not really have any significant medial lateral joint pain.  Full quick extension flexed over 105 degrees without instability.  No popliteal pain.  No calf pain.  Minimal patellar crepitation but no pain with patella compression.  No distal edema.  Good pulses.  Straight leg raise was negative.  Painless range of motion both hips  Specialty Comments:  No specialty comments available.  Imaging: No results found.   PMFS History: Patient Active Problem List   Diagnosis Date Noted  . Cervicalgia 04/21/2018  . Bilateral primary osteoarthritis of knee 01/03/2016   Past Medical History:  Diagnosis Date  . Acid reflux   . Anxiety   . Arthritis   . Cancer ([place])    skin cancer  . Diabetes mellitus without complication ([place])   . Hypertension     History reviewed. No pertinent family history.  Past Surgical History:  Procedure Laterality Date  . CARPAL TUNNEL RELEASE    . NOSE SURGERY     Social History   Occupational History  . Occupation: retired Clinical biochemist  Tobacco Use  . Smoking status: Current Every Day Smoker    Packs/day: 0.50    Years: 30.00    Pack years: 15.00    Types: Cigarettes  . Smokeless tobacco: Never Used  Substance and Sexual Activity  . Alcohol use: Yes    Comment: occasional drink  . Drug use: No  . Sexual activity: Not on file

## 2018-04-22 ENCOUNTER — Telehealth (INDEPENDENT_AMBULATORY_CARE_PROVIDER_SITE_OTHER): Payer: Self-pay | Admitting: Orthopaedic Surgery

## 2018-04-22 NOTE — Telephone Encounter (Signed)
Okay to order MRI per [facility]

## 2018-04-22 NOTE — Telephone Encounter (Signed)
Charlotte from the [place] office left a voicemail stating patient came by the office today and has decided that he would like an MRI of his knee.

## 2018-04-23 ENCOUNTER — Other Ambulatory Visit (INDEPENDENT_AMBULATORY_CARE_PROVIDER_SITE_OTHER): Payer: Self-pay | Admitting: Orthopaedic Surgery

## 2018-04-23 DIAGNOSIS — M1711 Unilateral primary osteoarthritis, right knee: Secondary | ICD-10-CM

## 2018-04-23 NOTE — Telephone Encounter (Signed)
Called patient. No answer. LMOM that MRI has been ordered of the right knee. I told him that once authorization was done he would be called to schedule.

## 2018-05-10 ENCOUNTER — Telehealth (INDEPENDENT_AMBULATORY_CARE_PROVIDER_SITE_OTHER): Payer: Self-pay | Admitting: Orthopaedic Surgery

## 2018-05-10 NOTE — Telephone Encounter (Signed)
Please see below.

## 2018-05-10 NOTE — Telephone Encounter (Signed)
Patient called requesting his MRI referral be sent to [facility] in [place].

## 2018-05-12 NOTE — Telephone Encounter (Signed)
Order changed to [facility] as requested by pt

## 2018-05-26 ENCOUNTER — Other Ambulatory Visit: Payer: Self-pay

## 2018-05-26 ENCOUNTER — Encounter (INDEPENDENT_AMBULATORY_CARE_PROVIDER_SITE_OTHER): Payer: Self-pay | Admitting: Orthopaedic Surgery

## 2018-05-26 ENCOUNTER — Ambulatory Visit (INDEPENDENT_AMBULATORY_CARE_PROVIDER_SITE_OTHER): Payer: PRIVATE HEALTH INSURANCE | Admitting: Orthopaedic Surgery

## 2018-05-26 VITALS — BP 123/93 | HR 130 | Ht 67.0 in | Wt 180.0 lb

## 2018-05-26 DIAGNOSIS — M170 Bilateral primary osteoarthritis of knee: Secondary | ICD-10-CM | POA: Diagnosis not present

## 2018-05-26 NOTE — Progress Notes (Signed)
Office Visit Note   Patient: Danny Hampton           Date of Birth: Jul 03, 1955           MRN: [id number] Visit Date: 05/26/2018              Requested by: No referring provider defined for this encounter. PCP: Patient, No Pcp Per   Assessment & Plan: Visit Diagnoses:  1. Bilateral primary osteoarthritis of knee     Plan: MRI scan of right ( the more symptomatic knee) demonstrates full-thickness cartilage loss within the weightbearing region of the medial compartment.  There is partial thickness loss within the weightbearing region of the lateral compartment and partial-thickness loss overlying the laterals sub-patella facet.  There is no joint effusion.  Possibly a small tear of the posterior horn of the medial meniscus.  Prior films of his knee in October demonstrated maintenance of the joint spaces in all 3 compartments with only mild narrowing medially.  No significant subchondral sclerosis.  He does have a lateral patella tilt on the right more so than on the left.  Danny Hampton had initially been seeking a knee replacement.  I had some doubts based on his plain films.  I am still concerned because he does not have tricompartmental degenerative changes.  However, he has had cortisone and Visco supplementation without much relief.  He also has chronic back pain is had surgery through [facility] and is presently being involved in a pain clinic in [place].  I would like Dr. Ninfa Linden to evaluate him to see if he would be a candidate for a partial i.e. medial compartment replacement.  Danny Hampton" subjective complaints of pain seem to be out of proportion to what I am seeing by plain films and MRI scan  Follow-Up Instructions: No follow-ups on file.   Orders:  No orders of the defined types were placed in this encounter.  No orders of the defined types were placed in this encounter.     Procedures: No procedures performed   Clinical Data: No additional findings.   Subjective: Chief  Complaint  Patient presents with  . Right Knee - Follow-up  Patient presents today for follow up on his right knee. He had an MRI done on 05/20/18. No changes since his last visit. He is taking meloxicam as needed.  HPI  Review of Systems   Objective: Vital Signs: BP (!) 123/93   Pulse (!) 130   Ht 5\' 7"  (1.702 m)   Wt 180 lb (81.6 kg)   BMI 28.19 kg/m   Physical Exam Constitutional:      Appearance: He is well-developed.  Eyes:     Pupils: Pupils are equal, round, and reactive to light.  Pulmonary:     Effort: Pulmonary effort is normal.  Skin:    General: Skin is warm and dry.  Neurological:     Mental Status: He is alert and oriented to person, place, and time.  Psychiatric:        Behavior: Behavior normal.     Ortho Exam ambulates without a limp.  No effusion right knee.  Predominant medial joint pain.  No significant increased varus of valgus.  No patella pain.  Mild patellar crepitation.  No pain laterally.  No instability.  No calf pain.  No popliteal fullness.  Motor exam intact.  Straight leg raise negative. Specialty Comments:  No specialty comments available.  Imaging: No results found.   PMFS History: Patient Active Problem List  Diagnosis Date Noted  . Cervicalgia 04/21/2018  . Bilateral primary osteoarthritis of knee 01/03/2016   Past Medical History:  Diagnosis Date  . Acid reflux   . Anxiety   . Arthritis   . Cancer ([place])    skin cancer  . Diabetes mellitus without complication ([place])   . Hypertension     History reviewed. No pertinent family history.  Past Surgical History:  Procedure Laterality Date  . CARPAL TUNNEL RELEASE    . NOSE SURGERY     Social History   Occupational History  . Occupation: retired Clinical biochemist  Tobacco Use  . Smoking status: Current Every Day Smoker    Packs/day: 0.50    Years: 30.00    Pack years: 15.00    Types: Cigarettes  . Smokeless tobacco: Never Used  Substance and Sexual Activity  . Alcohol  use: Yes    Comment: occasional drink  . Drug use: No  . Sexual activity: Not on file

## 2018-05-28 ENCOUNTER — Telehealth (INDEPENDENT_AMBULATORY_CARE_PROVIDER_SITE_OTHER): Payer: Self-pay

## 2018-05-28 NOTE — Addendum Note (Signed)
Addended by: Lendon Collar on: 05/28/2018 09:12 AM   Modules accepted: Orders

## 2018-05-28 NOTE — Telephone Encounter (Signed)
Called patient and asked the screening questions.  Do you have now or have you had in the past 7 days a fever and/or chills? NO  Do you have now or have you had in the past 7 days a cough? NO  Do you have now or have you had in the last 7 days nausea, vomiting or abdominal pain? NO  Have you been exposed to anyone who has tested positive for COVID-19? NO  Have you or anyone who lives with you traveled within the last month? NO 

## 2018-05-31 ENCOUNTER — Ambulatory Visit (INDEPENDENT_AMBULATORY_CARE_PROVIDER_SITE_OTHER): Payer: PRIVATE HEALTH INSURANCE | Admitting: Orthopaedic Surgery

## 2018-05-31 ENCOUNTER — Other Ambulatory Visit: Payer: Self-pay

## 2018-05-31 VITALS — Ht 67.0 in | Wt 180.0 lb

## 2018-05-31 DIAGNOSIS — M170 Bilateral primary osteoarthritis of knee: Secondary | ICD-10-CM | POA: Diagnosis not present

## 2018-05-31 NOTE — Progress Notes (Signed)
The patient is a very pleasant 63 year old gentleman that my partner Dr. Durward Fortes is having ACS second opinion on whether or not we felt the patient would benefit from a partial arthroplasty of the right knee medial compartment versus a total knee arthroplasty.  I did thoroughly read Dr. Rudene Anda note.  The patient does have chronic bilateral knee pain is had multiple injections over the years.  At one point he was in chronic pain management.  He says he is off all narcotics as at this point.  His right knee hurts much worse than his left knee but he is always been told the left knee is worse arthritis.  I have his plain films and MRIs to review.  His pain is daily.  It hurts more with pivoting activities and going up and down hills.  If he is been sitting for long period of time the knee hurts.  Driving hurts as well.  Again is the right worse than left.  He does get occasional swelling.  On examination neither knee has an effusion today.  Both knees are ligamentously stable.  The right knee has global tenderness medially and laterally and some patellofemoral crepitation is only mild but significant to the fact that his tenderness is global in all 3 compartments.  I did review the plain films and the MRI findings.  The MRI does show some full-thickness cartilage loss in the middle compartment of his knee with mild thinning at the patellofemoral joint and the lateral side.  He does have varus malalignment.  On exam that is easily correctable.  His range of motion is full of both knees.  I showed him a knee model explained in detail a partial versus total knee replacements are like.  We had a long and thorough discussion about this.  I explained the risk and benefits of both these.  Given his global pain I would lean more toward a total knee replacement as opposed to a partial knee replacement.  This is for several different reasons with the main reason being that he does have some thinning of the  patellofemoral lateral compartments.  Also given his global pain my concern would be that he would have a partial knee replacement but still complaining of pain.  I been leaning more toward total knees in general and most my patients given the track record of partial knee replacements.  All question concerns were answered and addressed.  He said he will follow-up with Dr. Durward Fortes in [place] to consider total knee replacement this standpoint.  I agree with this as well.

## 2018-06-09 ENCOUNTER — Telehealth (INDEPENDENT_AMBULATORY_CARE_PROVIDER_SITE_OTHER): Payer: Self-pay | Admitting: Orthopaedic Surgery

## 2018-06-09 NOTE — Telephone Encounter (Signed)
01/27/2018 ov note faxed Dept of [place]

## 2018-06-11 ENCOUNTER — Telehealth (INDEPENDENT_AMBULATORY_CARE_PROVIDER_SITE_OTHER): Payer: Self-pay | Admitting: Orthopaedic Surgery

## 2018-06-11 NOTE — Telephone Encounter (Signed)
Patient called in to let Dr.Whitfield know that he saw Dr.Blackman about his knee. He is wanting to pursue a total knee replacement when surgery is available to schedule.

## 2018-06-14 NOTE — Telephone Encounter (Signed)
Spoke with patient. He was made aware. I left surgery sheet for you to fill out on your computer.

## 2018-06-14 NOTE — Telephone Encounter (Signed)
Thanks...please return his call and tell him we will schedule his knee replacement when the OR's reopen

## 2018-06-14 NOTE — Telephone Encounter (Signed)
thanks

## 2018-07-14 ENCOUNTER — Telehealth: Payer: Self-pay | Admitting: Orthopaedic Surgery

## 2018-07-14 NOTE — Telephone Encounter (Signed)
Patient is unable to schedule right total knee at this time because he must have spine surgery first.  He is scheduled for the spine procedure August 23, 2018.  He has name and direct number for scheduling his knee surgery and will call after his recovery.

## 2018-09-21 ENCOUNTER — Telehealth: Payer: Self-pay | Admitting: Orthopedic Surgery

## 2018-09-21 NOTE — Telephone Encounter (Signed)
Patient is scheduled for right shoulder manipulation under anesthesia , arthroscopy with rotator cuff interval release on September 27, 2018 @ [facility] with Dr. Marlou Sa.  His employer is requesting a note/letter with the expected time out of work.  Also patient would like to know when he will be able to drive after his surgery. CPM order sent to [place] and post op appointment is scheduled for 10-04-18.

## 2018-09-22 ENCOUNTER — Other Ambulatory Visit: Payer: Self-pay

## 2018-09-30 ENCOUNTER — Ambulatory Visit (INDEPENDENT_AMBULATORY_CARE_PROVIDER_SITE_OTHER): Payer: Non-veteran care | Admitting: Orthopedic Surgery

## 2018-09-30 ENCOUNTER — Encounter: Payer: Self-pay | Admitting: Orthopedic Surgery

## 2018-09-30 ENCOUNTER — Other Ambulatory Visit: Payer: Self-pay

## 2018-09-30 DIAGNOSIS — M1711 Unilateral primary osteoarthritis, right knee: Secondary | ICD-10-CM | POA: Diagnosis not present

## 2018-09-30 NOTE — H&P (Addendum)
TOTAL KNEE ADMISSION H&P  Patient is being admitted for right total knee arthroplasty.  Subjective:  Chief Complaint:right knee pain.  HPI: Danny Hampton, 63 y.o. male, has a history of pain and functional disability in the right knee due to arthritis and has failed non-surgical conservative treatments for greater than 12 weeks to includeNSAID's and/or analgesics, corticosteriod injections, viscosupplementation injections and activity modification.  Onset of symptoms was gradual, starting 10 years ago with gradually worsening course since that time. The patient noted no past surgery on the right knee(s).  Patient currently rates pain in the right knee(s) at 7 out of 10 with activity. Patient has night pain, worsening of pain with activity and weight bearing, pain that interferes with activities of daily living, crepitus and joint swelling.  Patient has evidence of subchondral cysts and sclerosing by imaging studies.  There is no active infection.  Patient Active Problem List   Diagnosis Date Noted  . Cervicalgia 04/21/2018  . Bilateral primary osteoarthritis of knee 01/03/2016   Past Medical History:  Diagnosis Date  . Acid reflux   . Anxiety   . Arthritis   . Cancer ([place])    skin cancer  . Diabetes mellitus without complication ([place])   . Hypertension     Past Surgical History:  Procedure Laterality Date  . BACK SURGERY    . CARPAL TUNNEL RELEASE    . NOSE SURGERY      No current facility-administered medications for this encounter.    Current Outpatient Medications  Medication Sig Dispense Refill Last Dose  . atorvastatin (LIPITOR) 40 MG tablet Take by mouth.   Taking  . carbidopa-levodopa (SINEMET IR) 25-100 MG tablet Take by mouth.   Taking  . hydrOXYzine (ATARAX/VISTARIL) 25 MG tablet TAKE 1 TABLET FOUR TIMES A DAY AS NEEDED FOR ANXIETY  0 Taking  . lisinopril (PRINIVIL,ZESTRIL) 10 MG tablet Take 10 mg by mouth daily.   Taking  . metFORMIN (GLUCOPHAGE) 500 MG tablet  Take by mouth 2 (two) times daily with a meal.   Taking  . naproxen (NAPROSYN) 500 MG tablet Take 500 mg by mouth 2 (two) times daily with a meal.   Not Taking  . omeprazole (PRILOSEC) 40 MG capsule Take 40 mg by mouth daily.   Taking  . oxyCODONE (OXY IR/ROXICODONE) 5 MG immediate release tablet Take 5 mg by mouth every 4 (four) hours as needed for severe pain.   Not Taking  . oxyCODONE-acetaminophen (PERCOCET/ROXICET) 5-325 MG tablet Take 1 tablet by mouth every 6 (six) hours as needed. for pain   Taking  . phenytoin (DILANTIN) 100 MG ER capsule Take by mouth 3 (three) times daily.   Taking  . rOPINIRole (REQUIP) 0.25 MG tablet Take 1 mg by mouth at bedtime.   Taking   No Known Allergies  Social History   Tobacco Use  . Smoking status: Former Smoker    Packs/day: 0.50    Years: 30.00    Pack years: 15.00    Types: Cigarettes    Quit date: 03/2018    Years since quitting: 0.5  . Smokeless tobacco: Never Used  Substance Use Topics  . Alcohol use: Yes    Comment: occasional drink    No family history on file.   Review of Systems  Constitutional: Negative for fatigue.  HENT: Negative for trouble swallowing.   Eyes: Negative for pain.  Respiratory: Negative for shortness of breath.   Cardiovascular: Negative for leg swelling.  Gastrointestinal: Negative for constipation.  Endocrine: Negative for cold intolerance.  Genitourinary: Negative for difficulty urinating.  Musculoskeletal: Negative for joint swelling.  Skin: Negative for rash.  Allergic/Immunologic: Negative for food allergies.  Neurological: Positive for numbness.  Hematological: Does not bruise/bleed easily.  Psychiatric/Behavioral: Positive for sleep disturbance.    Objective:  Physical Exam  Constitutional: He is oriented to person, place, and time. He appears well-developed and well-nourished.  HENT:  Head: Normocephalic and atraumatic.  Eyes: Pupils are equal, round, and reactive to light. Conjunctivae and  EOM are normal.  Neck: Neck supple.  Cardiovascular: Normal rate, regular rhythm, normal heart sounds and intact distal pulses.  No murmur heard. Respiratory: Effort normal and breath sounds normal. He has no wheezes.  GI: Soft. Bowel sounds are normal. He exhibits no mass. There is no abdominal tenderness.  Neurological: He is alert and oriented to person, place, and time.  Skin: Skin is warm and dry.  Psychiatric: He has a normal mood and affect. His behavior is normal. Judgment and thought content normal.    Vital signs in last 24 hours: Temp:  [98 F (36.7 C)] 98 F (36.7 C) (07/23 1112) Pulse Rate:  [108] 108 (07/23 1112) Resp:  [16] 16 (07/23 1112) BP: (130)/(105) 130/105 (07/23 1112) Weight:  [82.6 kg] 82.6 kg (07/23 1112)  Labs:   Estimated body mass index is 30.29 kg/m as calculated from the following:   Height as of 09/30/18: 5\' 5"  (1.651 m).   Weight as of 09/30/18: 82.6 kg.   Imaging Review Plain radiographs demonstrate mild degenerative joint disease of the right knee(s). The overall alignment ismild valgus. The bone quality appears to be good for age and reported activity level.      Assessment/Plan:  End stage arthritis, right knee   The patient history, physical examination, clinical judgment of the provider and imaging studies are consistent with end stage degenerative joint disease of the right knee(s) and total knee arthroplasty is deemed medically necessary. The treatment options including medical management, injection therapy arthroscopy and arthroplasty were discussed at length. The risks and benefits of total knee arthroplasty were presented and reviewed. The risks due to aseptic loosening, infection, stiffness, patella tracking problems, thromboembolic complications and other imponderables were discussed. The patient acknowledged the explanation, agreed to proceed with the plan and consent was signed. Patient is being admitted for inpatient treatment for  surgery, pain control, PT, OT, prophylactic antibiotics, VTE prophylaxis, progressive ambulation and ADL's and discharge planning. The patient is planning to be discharged home with home health services     Patient's anticipated LOS is less than 2 midnights, meeting these requirements: - Younger than 62 - Lives within 1 hour of care - Has a competent adult at home to recover with post-op recover - NO history of  - Chronic pain requiring opiods  - Diabetes  - Coronary Artery Disease  - Heart failure  - Heart attack  - Stroke  - DVT/VTE  - Cardiac arrhythmia  - Respiratory Failure/COPD  - Renal failure  - Anemia  - Advanced Liver disease  Mike Craze. Mariane Masters [facility] [phone]  09/30/2018 1:26 PM  No Changes  Aaron Edelman D. [place], [place] [phone]  10/12/2018 7:25 AM

## 2018-09-30 NOTE — Progress Notes (Signed)
Office Visit Note   Patient: Danny Hampton           Date of Birth: 01/14/1956           MRN: [id number] Visit Date: 09/30/2018              Chief Complaint:right knee pain.  HPI: Danny Hampton, 63 y.o. male, has a history of pain and functional disability in the right knee due to arthritis and has failed non-surgical conservative treatments for greater than 12 weeks to includeNSAID's and/or analgesics, corticosteriod injections, viscosupplementation injections and activity modification.  Onset of symptoms was gradual, starting 10 years ago with gradually worsening course since that time. The patient noted no past surgery on the right knee(s).  Patient currently rates pain in the right knee(s) at 7 out of 10 with activity. Patient has night pain, worsening of pain with activity and weight bearing, pain that interferes with activities of daily living, crepitus and joint swelling.  Patient has evidence of subchondral cysts and sclerosing by imaging studies.  There is no active infection.  Patient Active Problem List   Diagnosis Date Noted  . Cervicalgia 04/21/2018  . Bilateral primary osteoarthritis of knee 01/03/2016   Past Medical History:  Diagnosis Date  . Acid reflux   . Anxiety   . Arthritis   . Cancer ([place])    skin cancer  . Diabetes mellitus without complication ([place])   . Hypertension     Past Surgical History:  Procedure Laterality Date  . BACK SURGERY    . CARPAL TUNNEL RELEASE    . NOSE SURGERY      No current facility-administered medications for this encounter.    Current Outpatient Medications  Medication Sig Dispense Refill Last Dose  . atorvastatin (LIPITOR) 40 MG tablet Take by mouth.   Taking  . carbidopa-levodopa (SINEMET IR) 25-100 MG tablet Take by mouth.   Taking  . hydrOXYzine (ATARAX/VISTARIL) 25 MG tablet TAKE 1 TABLET FOUR TIMES A DAY AS NEEDED FOR ANXIETY  0 Taking  . lisinopril (PRINIVIL,ZESTRIL) 10 MG tablet Take 10 mg by mouth daily.    Taking  . metFORMIN (GLUCOPHAGE) 500 MG tablet Take by mouth 2 (two) times daily with a meal.   Taking  . naproxen (NAPROSYN) 500 MG tablet Take 500 mg by mouth 2 (two) times daily with a meal.   Not Taking  . omeprazole (PRILOSEC) 40 MG capsule Take 40 mg by mouth daily.   Taking  . oxyCODONE (OXY IR/ROXICODONE) 5 MG immediate release tablet Take 5 mg by mouth every 4 (four) hours as needed for severe pain.   Not Taking  . oxyCODONE-acetaminophen (PERCOCET/ROXICET) 5-325 MG tablet Take 1 tablet by mouth every 6 (six) hours as needed. for pain   Taking  . phenytoin (DILANTIN) 100 MG ER capsule Take by mouth 3 (three) times daily.   Taking  . rOPINIRole (REQUIP) 0.25 MG tablet Take 1 mg by mouth at bedtime.   Taking   No Known Allergies  Social History   Tobacco Use  . Smoking status: Former Smoker    Packs/day: 0.50    Years: 30.00    Pack years: 15.00    Types: Cigarettes    Quit date: 03/2018    Years since quitting: 0.5  . Smokeless tobacco: Never Used  Substance Use Topics  . Alcohol use: Yes    Comment: occasional drink    No family history on file.   Review of Systems  Constitutional:  Negative for fatigue.  HENT: Negative for trouble swallowing.   Eyes: Negative for pain.  Respiratory: Negative for shortness of breath.   Cardiovascular: Negative for leg swelling.  Gastrointestinal: Negative for constipation.  Endocrine: Negative for cold intolerance.  Genitourinary: Negative for difficulty urinating.  Musculoskeletal: Negative for joint swelling.  Skin: Negative for rash.  Allergic/Immunologic: Negative for food allergies.  Neurological: Positive for numbness.  Hematological: Does not bruise/bleed easily.  Psychiatric/Behavioral: Positive for sleep disturbance.    Objective:  Physical Exam  Constitutional: He is oriented to person, place, and time. He appears well-developed and well-nourished.  HENT:  Head: Normocephalic and atraumatic.  Eyes: Pupils are  equal, round, and reactive to light. Conjunctivae and EOM are normal.  Neck: Neck supple.  Cardiovascular: Normal rate, regular rhythm, normal heart sounds and intact distal pulses.  No murmur heard. Respiratory: Effort normal and breath sounds normal. He has no wheezes.  GI: Soft. Bowel sounds are normal. He exhibits no mass. There is no abdominal tenderness.  Neurological: He is alert and oriented to person, place, and time.  Skin: Skin is warm and dry.  Psychiatric: He has a normal mood and affect. His behavior is normal. Judgment and thought content normal.    Vital signs in last 24 hours: Temp:  [98 F (36.7 C)] 98 F (36.7 C) (07/23 1112) Pulse Rate:  [108] 108 (07/23 1112) Resp:  [16] 16 (07/23 1112) BP: (130)/(105) 130/105 (07/23 1112) Weight:  [82.6 kg] 82.6 kg (07/23 1112)  Labs:   Estimated body mass index is 30.29 kg/m as calculated from the following:   Height as of 09/30/18: 5\' 5"  (1.651 m).   Weight as of 09/30/18: 82.6 kg.   Imaging Review Plain radiographs demonstrate mild degenerative joint disease of the right knee(s). The overall alignment ismild valgus. The bone quality appears to be good for age and reported activity level.      Assessment/Plan:  End stage arthritis, right knee   The patient history, physical examination, clinical judgment of the provider and imaging studies are consistent with end stage degenerative joint disease of the right knee(s) and total knee arthroplasty is deemed medically necessary. The treatment options including medical management, injection therapy arthroscopy and arthroplasty were discussed at length. The risks and benefits of total knee arthroplasty were presented and reviewed. The risks due to aseptic loosening, infection, stiffness, patella tracking problems, thromboembolic complications and other imponderables were discussed. The patient acknowledged the explanation, agreed to proceed with the plan and consent was signed.  Patient is being admitted for inpatient treatment for surgery, pain control, PT, OT, prophylactic antibiotics, VTE prophylaxis, progressive ambulation and ADL's and discharge planning. The patient is planning to be discharged home with home health services  Face-to-face time spent with patient was greater than 40 minutes.  Greater than 50% of the time was spent in counseling and coordination of care.  Mike Craze Mariane Masters [facility] [phone]  09/30/2018 1:28 PM

## 2018-10-06 NOTE — Patient Instructions (Addendum)
DUE TO COVID-19 ONLY ONE VISITOR IS ALLOWED IN THE HOSPITAL AT THIS TIME   COVID SWAB TESTING MUST BE COMPLETED ON: Friday, October 08, 2018 at      [address] [place] -[facility] enter pre surgical testing line (Must self quarantine after testing. Follow instructions on handout.)             Your procedure is scheduled on: Tuesday, Aug. 4, 2020   Report to [facility] Main  Entrance   Report to Short Stay at 5:30 AM   Call this number if you have problems the morning of surgery [phone]   Do not eat food:After Midnight.   May have liquids until 4:30AM day of surgery   CLEAR LIQUID DIET  Foods Allowed                                                                     Foods Excluded  Water, Black Coffee and tea, regular and decaf                             liquids that you cannot  Plain Jell-O in any flavor  (No red)                                           see through such as: Fruit ices (not with fruit pulp)                                     milk, soups, orange juice  Iced Popsicles (No red)                                    All solid food Carbonated beverages, regular and diet                                    Apple juices Sports drinks like Gatorade (No red) Lightly seasoned clear broth or consume(fat free) Sugar, honey syrup  Sample Menu Breakfast                                Lunch                                     Supper Cranberry juice                    Beef broth                            Chicken broth Jell-O  Grape juice                           Apple juice Coffee or tea                        Jell-O                                      Popsicle                                                Coffee or tea                        Coffee or tea   Complete one G2 drink the morning of surgery at 4:30AM the day of surgery.   Brush your teeth the morning of surgery.   Do NOT smoke  after Midnight   Take these medicines the morning of surgery with A SIP OF WATER: Atorvastatin, Carbidopa-Levodopa, Loratadine, Pantoprazole, Phenytoin   May use eyedrops per normal routine morning of surgery   TAKE DIABETIC MEDICATIONS PER NORMAL ROUTINE THE DAY BEFORE SURGERY  **DO NOT TAKE ANY DIABETIC MEDICATIONS DAY OF YOUR SURGERY**                               You may not have any metal on your body including jewelry, and body piercings             Do not wear lotions, powders, perfumes/cologne, or deodorant                           Men may shave face and neck.   Do not bring valuables to the hospital. [place].   Contacts, dentures or bridgework may not be worn into surgery.   Bring small overnight bag day of surgery.   Special Instructions: Bring a copy of your healthcare power of attorney and living will documents         the day of surgery if you haven't scanned them in before.              Please read over the following fact sheets you were given:  How to Manage Your Diabetes Before and After Surgery  Why is it important to control my blood sugar before and after surgery? . Improving blood sugar levels before and after surgery helps healing and can limit problems. . A way of improving blood sugar control is eating a healthy diet by: o  Eating less sugar and carbohydrates o  Increasing activity/exercise o  Talking with your doctor about reaching your blood sugar goals . High blood sugars (greater than 180 mg/dL) can raise your risk of infections and slow your recovery, so you will need to focus on controlling your diabetes during the weeks before surgery. . Make sure that the doctor who takes care of your diabetes knows about your planned surgery including the date and location.  How do I manage my  blood sugar before surgery? . Check your blood sugar at least 4 times a day, starting 2 days before surgery, to make  sure that the level is not too high or low. o Check your blood sugar the morning of your surgery when you wake up and every 2 hours until you get to the Short Stay unit. . If your blood sugar is less than 70 mg/dL, you will need to treat for low blood sugar: o Do not take insulin. o Treat a low blood sugar (less than 70 mg/dL) with  cup of clear juice (cranberry or apple), 4 glucose tablets, OR glucose gel. o Recheck blood sugar in 15 minutes after treatment (to make sure it is greater than 70 mg/dL). If your blood sugar is not greater than 70 mg/dL on recheck, call [phone] for further instructions. . Report your blood sugar to the short stay nurse when you get to Short Stay.  . If you are admitted to the hospital after surgery: o Your blood sugar will be checked by the staff and you will probably be given insulin after surgery (instead of oral diabetes medicines) to make sure you have good blood sugar levels. o The goal for blood sugar control after surgery is 80-180 mg/dL.   WHAT DO I DO ABOUT MY DIABETES MEDICATION?  [name] Do not take oral diabetes medicines (pills) the morning of surgery.  [place] - Preparing for Surgery Before surgery, you can play an important role.  Because skin is not sterile, your skin needs to be as free of germs as possible.  You can reduce the number of germs on your skin by washing with CHG (chlorahexidine gluconate) soap before surgery.  CHG is an antiseptic cleaner which kills germs and bonds with the skin to continue killing germs even after washing. Please DO NOT use if you have an allergy to CHG or antibacterial soaps.  If your skin becomes reddened/irritated stop using the CHG and inform your nurse when you arrive at Short Stay. Do not shave (including legs and underarms) for at least 48 hours prior to the first CHG shower.  You may shave your face/neck.  Please follow these instructions carefully:  1.  Shower with CHG Soap the night before surgery and  the  morning of surgery.  2.  If you choose to wash your hair, wash your hair first as usual with your normal  shampoo.  3.  After you shampoo, rinse your hair and body thoroughly to remove the shampoo.                             4.  Use CHG as you would any other liquid soap.  You can apply chg directly to the skin and wash.  Gently with a scrungie or clean washcloth.  5.  Apply the CHG Soap to your body ONLY FROM THE NECK DOWN.   Do   not use on face/ open                           Wound or open sores. Avoid contact with eyes, ears mouth and   genitals (private parts).                       Wash face,  Genitals (private parts) with your normal soap.             6.  Wash thoroughly, paying special attention to the area where your    surgery  will be performed.  7.  Thoroughly rinse your body with warm water from the neck down.  8.  DO NOT shower/wash with your normal soap after using and rinsing off the CHG Soap.                9.  Pat yourself dry with a clean towel.            10.  Wear clean pajamas.            11.  Place clean sheets on your bed the night of your first shower and do not  sleep with pets. Day of Surgery : Do not apply any lotions/deodorants the morning of surgery.  Please wear clean clothes to the hospital/surgery center.  FAILURE TO FOLLOW THESE INSTRUCTIONS MAY RESULT IN THE CANCELLATION OF YOUR SURGERY  PATIENT SIGNATURE_________________________________  NURSE SIGNATURE__________________________________  ________________________________________________________________________   Danny Hampton  An incentive spirometer is a tool that can help keep your lungs clear and active. This tool measures how well you are filling your lungs with each breath. Taking long deep breaths may help reverse or decrease the chance of developing breathing (pulmonary) problems (especially infection) following:  A long period of time when you are unable to move or be  active. BEFORE THE PROCEDURE   If the spirometer includes an indicator to show your best effort, your nurse or respiratory therapist will set it to a desired goal.  If possible, sit up straight or lean slightly forward. Try not to slouch.  Hold the incentive spirometer in an upright position. INSTRUCTIONS FOR USE  1. Sit on the edge of your bed if possible, or sit up as far as you can in bed or on a chair. 2. Hold the incentive spirometer in an upright position. 3. Breathe out normally. 4. Place the mouthpiece in your mouth and seal your lips tightly around it. 5. Breathe in slowly and as deeply as possible, raising the piston or the ball toward the top of the column. 6. Hold your breath for 3-5 seconds or for as long as possible. Allow the piston or ball to fall to the bottom of the column. 7. Remove the mouthpiece from your mouth and breathe out normally. 8. Rest for a few seconds and repeat Steps 1 through 7 at least 10 times every 1-2 hours when you are awake. Take your time and take a few normal breaths between deep breaths. 9. The spirometer may include an indicator to show your best effort. Use the indicator as a goal to work toward during each repetition. 10. After each set of 10 deep breaths, practice coughing to be sure your lungs are clear. If you have an incision (the cut made at the time of surgery), support your incision when coughing by placing a pillow or rolled up towels firmly against it. Once you are able to get out of bed, walk around indoors and cough well. You may stop using the incentive spirometer when instructed by your caregiver.  RISKS AND COMPLICATIONS  Take your time so you do not get dizzy or light-headed.  If you are in pain, you may need to take or ask for pain medication before doing incentive spirometry. It is harder to take a deep breath if you are having pain. AFTER USE  Rest and breathe slowly and easily.  It can be helpful to keep track of a log of  your progress. Your caregiver can provide you with a simple table to help with this. If you are using the spirometer at home, follow these instructions: [place] IF:   You are having difficultly using the spirometer.  You have trouble using the spirometer as often as instructed.  Your pain medication is not giving enough relief while using the spirometer.  You develop fever of 100.5 F (38.1 C) or higher. SEEK IMMEDIATE MEDICAL CARE IF:   You cough up bloody sputum that had not been present before.  You develop fever of 102 F (38.9 C) or greater.  You develop worsening pain at or near the incision site. MAKE SURE YOU:   Understand these instructions.  Will watch your condition.  Will get help right away if you are not doing well or get worse. Document Released: 07/07/2006 Document Revised: 05/19/2011 Document Reviewed: 09/07/2006 ExitCare Patient Information 2014 ExitCare, [place].   ________________________________________________________________________  WHAT IS A BLOOD TRANSFUSION? Blood Transfusion Information  A transfusion is the replacement of blood or some of its parts. Blood is made up of multiple cells which provide different functions.  Red blood cells carry oxygen and are used for blood loss replacement.  White blood cells fight against infection.  Platelets control bleeding.  Plasma helps clot blood.  Other blood products are available for specialized needs, such as hemophilia or other clotting disorders. BEFORE THE TRANSFUSION  Who gives blood for transfusions?   Healthy volunteers who are fully evaluated to make sure their blood is safe. This is blood bank blood. Transfusion therapy is the safest it has ever been in the practice of medicine. Before blood is taken from a donor, a complete history is taken to make sure that person has no history of diseases nor engages in risky social behavior (examples are intravenous drug use or sexual activity  with multiple partners). The donor's travel history is screened to minimize risk of transmitting infections, such as malaria. The donated blood is tested for signs of infectious diseases, such as HIV and hepatitis. The blood is then tested to be sure it is compatible with you in order to minimize the chance of a transfusion reaction. If you or a relative donates blood, this is often done in anticipation of surgery and is not appropriate for emergency situations. It takes many days to process the donated blood. RISKS AND COMPLICATIONS Although transfusion therapy is very safe and saves many lives, the main dangers of transfusion include:   Getting an infectious disease.  Developing a transfusion reaction. This is an allergic reaction to something in the blood you were given. Every precaution is taken to prevent this. The decision to have a blood transfusion has been considered carefully by your caregiver before blood is given. Blood is not given unless the benefits outweigh the risks. AFTER THE TRANSFUSION  Right after receiving a blood transfusion, you will usually feel much better and more energetic. This is especially true if your red blood cells have gotten low (anemic). The transfusion raises the level of the red blood cells which carry oxygen, and this usually causes an energy increase.  The nurse administering the transfusion will monitor you carefully for complications. HOME CARE INSTRUCTIONS  No special instructions are needed after a transfusion. You may find your energy is better. Speak with your caregiver about any limitations on activity for underlying diseases you may have. SEEK MEDICAL CARE IF:   Your condition is not improving after your transfusion.  You develop redness or  irritation at the intravenous (IV) site. SEEK IMMEDIATE MEDICAL CARE IF:  Any of the following symptoms occur over the next 12 hours:  Shaking chills.  You have a temperature by mouth above 102 F (38.9  C), not controlled by medicine.  Chest, back, or muscle pain.  People around you feel you are not acting correctly or are confused.  Shortness of breath or difficulty breathing.  Dizziness and fainting.  You get a rash or develop hives.  You have a decrease in urine output.  Your urine turns a dark color or changes to pink, red, or brown. Any of the following symptoms occur over the next 10 days:  You have a temperature by mouth above 102 F (38.9 C), not controlled by medicine.  Shortness of breath.  Weakness after normal activity.  The white part of the eye turns yellow (jaundice).  You have a decrease in the amount of urine or are urinating less often.  Your urine turns a dark color or changes to pink, red, or brown. Document Released: 02/22/2000 Document Revised: 05/19/2011 Document Reviewed: 10/11/2007 [facility] Patient Information 2014 [place], [place].  _______________________________________________________________________

## 2018-10-07 ENCOUNTER — Encounter (HOSPITAL_COMMUNITY)
Admission: RE | Admit: 2018-10-07 | Discharge: 2018-10-07 | Disposition: A | Discharge status: Home / Self Care | Payer: No Typology Code available for payment source | Source: Ambulatory Visit | Attending: Orthopaedic Surgery | Admitting: Orthopaedic Surgery

## 2018-10-07 ENCOUNTER — Other Ambulatory Visit: Payer: Self-pay

## 2018-10-07 ENCOUNTER — Encounter (HOSPITAL_COMMUNITY): Payer: Self-pay

## 2018-10-07 ENCOUNTER — Ambulatory Visit (HOSPITAL_COMMUNITY)
Admission: RE | Admit: 2018-10-07 | Discharge: 2018-10-07 | Disposition: A | Discharge status: Home / Self Care | Payer: No Typology Code available for payment source | Source: Ambulatory Visit | Attending: Orthopedic Surgery | Admitting: Orthopedic Surgery

## 2018-10-07 DIAGNOSIS — M1711 Unilateral primary osteoarthritis, right knee: Secondary | ICD-10-CM | POA: Diagnosis not present

## 2018-10-07 DIAGNOSIS — Z791 Long term (current) use of non-steroidal anti-inflammatories (NSAID): Secondary | ICD-10-CM | POA: Diagnosis not present

## 2018-10-07 DIAGNOSIS — Z7982 Long term (current) use of aspirin: Secondary | ICD-10-CM | POA: Diagnosis not present

## 2018-10-07 DIAGNOSIS — Z7984 Long term (current) use of oral hypoglycemic drugs: Secondary | ICD-10-CM | POA: Insufficient documentation

## 2018-10-07 DIAGNOSIS — Z20828 Contact with and (suspected) exposure to other viral communicable diseases: Secondary | ICD-10-CM | POA: Diagnosis not present

## 2018-10-07 DIAGNOSIS — E119 Type 2 diabetes mellitus without complications: Secondary | ICD-10-CM | POA: Insufficient documentation

## 2018-10-07 DIAGNOSIS — Z79899 Other long term (current) drug therapy: Secondary | ICD-10-CM | POA: Insufficient documentation

## 2018-10-07 DIAGNOSIS — I10 Essential (primary) hypertension: Secondary | ICD-10-CM | POA: Diagnosis not present

## 2018-10-07 DIAGNOSIS — G7112 Myotonia congenita: Secondary | ICD-10-CM | POA: Diagnosis not present

## 2018-10-07 DIAGNOSIS — Z87891 Personal history of nicotine dependence: Secondary | ICD-10-CM | POA: Diagnosis not present

## 2018-10-07 DIAGNOSIS — Z85828 Personal history of other malignant neoplasm of skin: Secondary | ICD-10-CM | POA: Diagnosis not present

## 2018-10-07 DIAGNOSIS — I491 Atrial premature depolarization: Secondary | ICD-10-CM | POA: Insufficient documentation

## 2018-10-07 DIAGNOSIS — Z01818 Encounter for other preprocedural examination: Secondary | ICD-10-CM

## 2018-10-07 DIAGNOSIS — K219 Gastro-esophageal reflux disease without esophagitis: Secondary | ICD-10-CM | POA: Insufficient documentation

## 2018-10-07 HISTORY — DX: Other complications of anesthesia, initial encounter: T88.59XA

## 2018-10-07 HISTORY — DX: Myotonia congenita: G71.12

## 2018-10-07 HISTORY — DX: Family history of other specified conditions: Z84.89

## 2018-10-07 LAB — APTT: aPTT: 29 seconds (ref 24–36)

## 2018-10-07 LAB — URINALYSIS, ROUTINE W REFLEX MICROSCOPIC
Bilirubin Urine: NEGATIVE
Glucose, UA: NEGATIVE mg/dL
Hgb urine dipstick: NEGATIVE
Ketones, ur: NEGATIVE mg/dL
Leukocytes,Ua: NEGATIVE
Nitrite: NEGATIVE
Protein, ur: NEGATIVE mg/dL
Specific Gravity, Urine: 1.005 (ref 1.005–1.030)
pH: 7 (ref 5.0–8.0)

## 2018-10-07 LAB — COMPREHENSIVE METABOLIC PANEL
ALT: 14 U/L (ref 0–44)
AST: 25 U/L (ref 15–41)
Albumin: 4.5 g/dL (ref 3.5–5.0)
Alkaline Phosphatase: 76 U/L (ref 38–126)
Anion gap: 12 (ref 5–15)
BUN: 14 mg/dL (ref 8–23)
CO2: 26 mmol/L (ref 22–32)
Calcium: 9.9 mg/dL (ref 8.9–10.3)
Chloride: 101 mmol/L (ref 98–111)
Creatinine, Ser: 0.62 mg/dL (ref 0.61–1.24)
GFR calc Af Amer: >60 mL/min (ref >60)
GFR calc non Af Amer: >60 mL/min (ref >60)
Glucose, Bld: 118 mg/dL — ABNORMAL HIGH (ref 70–99)
Potassium: 4.4 mmol/L (ref 3.5–5.1)
Sodium: 139 mmol/L (ref 135–145)
Total Bilirubin: 0.3 mg/dL (ref 0.3–1.2)
Total Protein: 7.3 g/dL (ref 6.5–8.1)

## 2018-10-07 LAB — CBC WITH DIFFERENTIAL/PLATELET
Abs Immature Granulocytes: 0.04 10*3/uL (ref 0.00–0.07)
Basophils Absolute: 0.1 10*3/uL (ref 0.0–0.1)
Basophils Relative: 1 %
Eosinophils Absolute: 0.3 10*3/uL (ref 0.0–0.5)
Eosinophils Relative: 3 %
HCT: 38.8 % — ABNORMAL LOW (ref 39.0–52.0)
Hemoglobin: 12.6 g/dL — ABNORMAL LOW (ref 13.0–17.0)
Immature Granulocytes: 1 %
Lymphocytes Relative: 19 %
Lymphs Abs: 1.5 10*3/uL (ref 0.7–4.0)
MCH: 30.8 pg (ref 26.0–34.0)
MCHC: 32.5 g/dL (ref 30.0–36.0)
MCV: 94.9 fL (ref 80.0–100.0)
Monocytes Absolute: 0.7 10*3/uL (ref 0.1–1.0)
Monocytes Relative: 9 %
Neutro Abs: 5.2 10*3/uL (ref 1.7–7.7)
Neutrophils Relative %: 67 %
Platelets: 216 10*3/uL (ref 150–400)
RBC: 4.09 MIL/uL — ABNORMAL LOW (ref 4.22–5.81)
RDW: 12.8 % (ref 11.5–15.5)
WBC: 7.7 10*3/uL (ref 4.0–10.5)
nRBC: 0 % (ref 0.0–0.2)

## 2018-10-07 LAB — HEMOGLOBIN A1C
Hgb A1c MFr Bld: 5.1 % (ref 4.8–5.6)
Mean Plasma Glucose: 99.67 mg/dL

## 2018-10-07 LAB — PROTIME-INR
INR: 0.9 (ref 0.8–1.2)
Prothrombin Time: 11.7 seconds (ref 11.4–15.2)

## 2018-10-07 LAB — SURGICAL PCR SCREEN
MRSA, PCR: NEGATIVE
Staphylococcus aureus: POSITIVE — AB

## 2018-10-07 LAB — GLUCOSE, CAPILLARY: Glucose-Capillary: 94 mg/dL (ref 70–99)

## 2018-10-08 ENCOUNTER — Other Ambulatory Visit (HOSPITAL_COMMUNITY)
Admission: RE | Admit: 2018-10-08 | Discharge: 2018-10-08 | Disposition: A | Discharge status: Home / Self Care | Payer: No Typology Code available for payment source | Source: Ambulatory Visit | Attending: Orthopaedic Surgery | Admitting: Orthopaedic Surgery

## 2018-10-08 ENCOUNTER — Telehealth: Payer: Self-pay | Admitting: Orthopaedic Surgery

## 2018-10-08 ENCOUNTER — Other Ambulatory Visit (HOSPITAL_COMMUNITY): Payer: Self-pay

## 2018-10-08 DIAGNOSIS — Z20828 Contact with and (suspected) exposure to other viral communicable diseases: Secondary | ICD-10-CM | POA: Diagnosis present

## 2018-10-08 LAB — URINE CULTURE: Culture: <10000 — AB

## 2018-10-08 LAB — SARS CORONAVIRUS 2 (TAT 6-24 HRS): SARS Coronavirus 2: NEGATIVE

## 2018-10-08 NOTE — Telephone Encounter (Signed)
Patient calling to check on status of EKG report. Patient states [facility] was suppose to fax it to [place]. Patient wants to make sure surgery is still going to be okay for Tuesday 10/12/2018. Please call to advise.

## 2018-10-08 NOTE — Telephone Encounter (Signed)
Patient called stating he had surgery in June at [facility] in [place] and had an EKG done at his pre-op on 08/18/18.  Patient states Dr. Durward Fortes needs to send a request for EKG records including patient's name and date of birth and they can fax a copy of the report to him.  Fax [phone]

## 2018-10-08 NOTE — Progress Notes (Addendum)
Anesthesia Chart Review   Case: [id number] Date/Time: 10/12/18 0700   Procedure: RIGHT TOTAL KNEE ARTHROPLASTY (Right )   Anesthesia type: General   Pre-op diagnosis: RIGHT KNEE OSTEOARTHRITIS   Location: [facility] ROOM 04 / [facility]   Surgeon: Garald Balding, MD      DISCUSSION:63 y.o. 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 Joni Fears.   No anesthesia complications noted with left L2-5 microdecompression 09/13/2015 at [facility] with general anesthesia.  Per Dr. 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  He was not diagnosed until 2000, served in the [employer] 3 years, c/o life long severe muscle cramps and quicker muscle fatigue as he has gotten older.  On clinical grounds is risk of general anesthesia does not appear to be greater than that of general population."  Echo ordered at this time with normal left ventricular systolic function, EF 00%.  Recommended non-triggering anesthetic and avoiding hypothermia.  He reports with a more recent lower back surgery in [place] he developed an ileus.  Anesthesia records requested.   Discussed with Dr. Christella Hartigan.  Anticipate pt can proceed with planned procedure barring acute status change.   VS: BP (!) 141/90   Pulse 88   Temp 36.8 C (Oral)   Resp 16   Ht 5\' 7"  (1.702 m)   Wt 83 kg   SpO2 99%   BMI 28.66 kg/m   PROVIDERS: System, Pcp Not In   LABS: Labs reviewed: Acceptable for surgery. (all labs ordered are listed, but only abnormal results are displayed)  Labs Reviewed  URINE CULTURE - Abnormal; Notable for the following components:      Result Value   Culture   (*)    Value: <10,000 COLONIES/mL INSIGNIFICANT GROWTH Performed at [facility], [address], [place], [place] [address]    All other components within  normal limits  SURGICAL PCR SCREEN - Abnormal; Notable for the following components:   Staphylococcus aureus POSITIVE (*)    All other components within normal limits  CBC WITH DIFFERENTIAL/PLATELET - Abnormal; Notable for the following components:   RBC 4.09 (*)    Hemoglobin 12.6 (*)    HCT 38.8 (*)    All other components within normal limits  COMPREHENSIVE METABOLIC PANEL - Abnormal; Notable for the following components:   Glucose, Bld 118 (*)    All other components within normal limits  URINALYSIS, ROUTINE W REFLEX MICROSCOPIC - Abnormal; Notable for the following components:   APPearance HAZY (*)    All other components within normal limits  SARS CORONAVIRUS 2  GLUCOSE, CAPILLARY  APTT  PROTIME-INR  HEMOGLOBIN A1C  TYPE AND SCREEN     IMAGES: Chest 10/07/2018 FINDINGS: Heart size and mediastinal contours are within normal limits. Lungs are clear. No pleural effusion. Osseous structures about the chest are unremarkable.  IMPRESSION: No active cardiopulmonary disease  EKG: 10/07/2018 Rate 88 bpm Sinus rhythm with Premature supraventricular complexes T wave abnormality, consider anterior ischemia Abnormal ECG Similar findings on EKG 08/30/15  CV: Echo 09/04/15 (On Care Everywhere) INTERPRETATION --------------------------------------------------------------- LV EF 55%  NORMAL LEFT VENTRICULAR SYSTOLIC FUNCTION WITH MILD LVH  NORMAL RIGHT VENTRICULAR SYSTOLIC FUNCTION  VALVULAR REGURGITATION: TRIVIAL MR, TRIVIAL PR, TRIVIAL TR  NO VALVULAR STENOSIS  NO PRIOR STUDY FOR COMPARISON  3D acquisition and reconstructions  were performed as part of this  examination to more accurately quantify the effects of heart failure  regardless of ejection fraction. Past Medical History:  Diagnosis Date  . Acid reflux   . Anxiety   . Arthritis   . Cancer ([place])    skin cancer  . Complication of anesthesia   . Diabetes mellitus without complication ([place])   . Family  history of adverse reaction to anesthesia   . Hypertension   . Myotonia congenita     Past Surgical History:  Procedure Laterality Date  . BACK SURGERY     times 2  . CARPAL TUNNEL RELEASE    . NOSE SURGERY      MEDICATIONS: . aspirin EC 81 MG tablet  . atorvastatin (LIPITOR) 80 MG tablet  . b complex vitamins tablet  . Carbidopa-Levodopa ER 48.75-195 MG CPCR  . Cholecalciferol (VITAMIN D3 PO)  . hydrOXYzine (ATARAX/VISTARIL) 25 MG tablet  . lisinopril-hydrochlorothiazide (ZESTORETIC) 20-25 MG tablet  . loratadine (CLARITIN) 10 MG tablet  . meloxicam (MOBIC) 15 MG tablet  . metFORMIN (GLUCOPHAGE) 500 MG tablet  . Multiple Vitamin (MULTIVITAMIN PO)  . pantoprazole (PROTONIX) 40 MG tablet  . phenytoin (DILANTIN) 100 MG ER capsule  . Polyvinyl Alcohol-Povidone PF (REFRESH) 1.4-0.6 % SOLN  . rOPINIRole (REQUIP) 1 MG tablet   No current facility-administered medications for this encounter.    Maia Plan [facility] Pre-Surgical Testing [phone] 10/08/18  12:19 PM

## 2018-10-08 NOTE — Telephone Encounter (Signed)
Called patient. He has been advised that request was sent this afternoon. We are just waiting on the fax from [facility].

## 2018-10-08 NOTE — Telephone Encounter (Signed)
Spoke with patient. I will fax request over. Patient states that [facility] did not need a signed request from the patient.

## 2018-10-08 NOTE — Anesthesia Preprocedure Evaluation (Addendum)
Anesthesia Evaluation  Patient identified by MRN, date of birth, ID band Patient awake    Reviewed: Allergy & Precautions, NPO status , Patient's Chart, lab work & pertinent test results  Airway Mallampati: II       Dental  (+) Poor Dentition,    Pulmonary former smoker,    Pulmonary exam normal breath sounds clear to auscultation       Cardiovascular hypertension, Pt. on medications Normal cardiovascular exam Rhythm:Regular Rate:Normal     Neuro/Psych PSYCHIATRIC DISORDERS Anxiety  Neuromuscular disease    GI/Hepatic GERD  Medicated,  Endo/Other  diabetes, Type 2, Oral Hypoglycemic Agents  Renal/GU      Musculoskeletal  (+) Arthritis , Osteoarthritis,    Abdominal   Peds  Hematology   Anesthesia Other Findings   Reproductive/Obstetrics                           Anesthesia Physical Anesthesia Plan  ASA: III  Anesthesia Plan: Spinal and General   Post-op Pain Management:  Regional for Post-op pain   Induction: Intravenous  PONV Risk Score and Plan:   Airway Management Planned: Simple Face Mask, Nasal Cannula, Natural Airway and LMA  Additional Equipment:   Intra-op Plan:   Post-operative Plan: Extubation in OR  Informed Consent: I have reviewed the patients History and Physical, chart, labs and discussed the procedure including the risks, benefits and alternatives for the proposed anesthesia with the patient or authorized representative who has indicated his/her understanding and acceptance.     Dental advisory given  Plan Discussed with: CRNA  Anesthesia Plan Comments: (See PAT note 10/07/2018, Konrad Felix, PA-C)      Anesthesia Quick Evaluation

## 2018-10-08 NOTE — Telephone Encounter (Signed)
Request for EKG report has been faxed to [phone]

## 2018-10-11 ENCOUNTER — Telehealth: Payer: Self-pay | Admitting: Orthopaedic Surgery

## 2018-10-11 NOTE — Telephone Encounter (Signed)
Per Aaron Edelman, patient should show up for surgery unless [facility] advises different. Pending EKG review from anesthesia.

## 2018-10-11 NOTE — Telephone Encounter (Signed)
I called patient 

## 2018-10-11 NOTE — Telephone Encounter (Signed)
Patient left a voicemail stating he was in contact with the office on Friday, 10/08/18 in regards to his surgery which is scheduled for tomorrow 10/12/18, but states "he really doesn't know if it will happen or not due to my EKG."   Patient states "he talked to different people, but no one ever got back to him."  Patient states "I would really like to know this morning what is going on and where everything stands."

## 2018-10-12 ENCOUNTER — Encounter (HOSPITAL_COMMUNITY)
Admission: RE | Disposition: A | Discharge status: Home Health | Payer: Self-pay | Source: Home / Self Care | Attending: Orthopaedic Surgery

## 2018-10-12 ENCOUNTER — Inpatient Hospital Stay (HOSPITAL_COMMUNITY): Payer: No Typology Code available for payment source | Admitting: Anesthesiology

## 2018-10-12 ENCOUNTER — Inpatient Hospital Stay (HOSPITAL_COMMUNITY): Payer: No Typology Code available for payment source | Admitting: Physician Assistant

## 2018-10-12 ENCOUNTER — Other Ambulatory Visit: Payer: Self-pay

## 2018-10-12 ENCOUNTER — Encounter (HOSPITAL_COMMUNITY): Payer: Self-pay | Admitting: Emergency Medicine

## 2018-10-12 ENCOUNTER — Inpatient Hospital Stay (HOSPITAL_COMMUNITY)
Admission: RE | Admit: 2018-10-12 | Discharge: 2018-10-13 | DRG: 470 | Disposition: A | Discharge status: Home Health | Payer: No Typology Code available for payment source | Attending: Orthopaedic Surgery | Admitting: Orthopaedic Surgery

## 2018-10-12 DIAGNOSIS — K219 Gastro-esophageal reflux disease without esophagitis: Secondary | ICD-10-CM | POA: Diagnosis present

## 2018-10-12 DIAGNOSIS — F419 Anxiety disorder, unspecified: Secondary | ICD-10-CM | POA: Diagnosis present

## 2018-10-12 DIAGNOSIS — M659 Synovitis and tenosynovitis, unspecified: Secondary | ICD-10-CM | POA: Diagnosis present

## 2018-10-12 DIAGNOSIS — Z791 Long term (current) use of non-steroidal anti-inflammatories (NSAID): Secondary | ICD-10-CM | POA: Diagnosis not present

## 2018-10-12 DIAGNOSIS — Z79899 Other long term (current) drug therapy: Secondary | ICD-10-CM

## 2018-10-12 DIAGNOSIS — Z7984 Long term (current) use of oral hypoglycemic drugs: Secondary | ICD-10-CM

## 2018-10-12 DIAGNOSIS — Z85828 Personal history of other malignant neoplasm of skin: Secondary | ICD-10-CM

## 2018-10-12 DIAGNOSIS — D62 Acute posthemorrhagic anemia: Secondary | ICD-10-CM | POA: Diagnosis not present

## 2018-10-12 DIAGNOSIS — Z87891 Personal history of nicotine dependence: Secondary | ICD-10-CM | POA: Diagnosis not present

## 2018-10-12 DIAGNOSIS — E119 Type 2 diabetes mellitus without complications: Secondary | ICD-10-CM | POA: Diagnosis not present

## 2018-10-12 DIAGNOSIS — Z96659 Presence of unspecified artificial knee joint: Secondary | ICD-10-CM

## 2018-10-12 DIAGNOSIS — M25761 Osteophyte, right knee: Secondary | ICD-10-CM | POA: Diagnosis not present

## 2018-10-12 DIAGNOSIS — M1711 Unilateral primary osteoarthritis, right knee: Secondary | ICD-10-CM

## 2018-10-12 DIAGNOSIS — I10 Essential (primary) hypertension: Secondary | ICD-10-CM | POA: Diagnosis not present

## 2018-10-12 DIAGNOSIS — Z96651 Presence of right artificial knee joint: Secondary | ICD-10-CM

## 2018-10-12 DIAGNOSIS — M170 Bilateral primary osteoarthritis of knee: Secondary | ICD-10-CM | POA: Diagnosis present

## 2018-10-12 DIAGNOSIS — M25561 Pain in right knee: Secondary | ICD-10-CM | POA: Diagnosis present

## 2018-10-12 HISTORY — PX: TOTAL KNEE ARTHROPLASTY: SHX125

## 2018-10-12 HISTORY — DX: Presence of unspecified artificial knee joint: Z96.659

## 2018-10-12 LAB — TYPE AND SCREEN
ABO/RH(D): A NEG
Antibody Screen: NEGATIVE

## 2018-10-12 LAB — GLUCOSE, CAPILLARY
Glucose-Capillary: 115 mg/dL — ABNORMAL HIGH (ref 70–99)
Glucose-Capillary: 123 mg/dL — ABNORMAL HIGH (ref 70–99)
Glucose-Capillary: 163 mg/dL — ABNORMAL HIGH (ref 70–99)
Glucose-Capillary: 109 mg/dL — ABNORMAL HIGH (ref 70–99)

## 2018-10-12 LAB — ABO/RH: ABO/RH(D): A NEG

## 2018-10-12 LAB — HEMOGLOBIN A1C
Hgb A1c MFr Bld: 5.1 % (ref 4.8–5.6)
Mean Plasma Glucose: 99.67 mg/dL

## 2018-10-12 SURGERY — ARTHROPLASTY, KNEE, TOTAL
Anesthesia: Spinal | Laterality: Right

## 2018-10-12 MED ORDER — DEXAMETHASONE SODIUM PHOSPHATE 10 MG/ML IJ SOLN
INTRAMUSCULAR | Status: DC | PRN
  Administered 2018-10-12: 10 mg via INTRAVENOUS

## 2018-10-12 MED ORDER — LISINOPRIL-HYDROCHLOROTHIAZIDE 20-25 MG PO TABS: 1.0000 | ORAL_TABLET | Freq: Every day | ORAL | Status: DC

## 2018-10-12 MED ORDER — METOCLOPRAMIDE HCL 5 MG PO TABS: 5.0000 mg | ORAL_TABLET | Freq: Three times a day (TID) | ORAL | Status: DC | PRN

## 2018-10-12 MED ORDER — METHOCARBAMOL 500 MG IVPB - SIMPLE MED
500.0000 mg | Freq: Four times a day (QID) | INTRAVENOUS | Status: DC | PRN
  Administered 2018-10-12: 10:00:00 500 mg via INTRAVENOUS
  Filled 2018-10-12: qty 50

## 2018-10-12 MED ORDER — ROPINIROLE HCL 1 MG PO TABS
1.0000 mg | ORAL_TABLET | Freq: Every evening | ORAL | Status: DC
  Administered 2018-10-12: 17:00:00 1 mg via ORAL
  Filled 2018-10-12: qty 1
  Filled 2018-10-12: qty 2

## 2018-10-12 MED ORDER — SODIUM CHLORIDE 0.9 % IV SOLN
75.0000 mL/h | INTRAVENOUS | Status: DC
  Administered 2018-10-12 – 2018-10-13 (×2): 75 mL/h via INTRAVENOUS

## 2018-10-12 MED ORDER — DOCUSATE SODIUM 100 MG PO CAPS
100.0000 mg | ORAL_CAPSULE | Freq: Two times a day (BID) | ORAL | Status: DC
  Administered 2018-10-12 – 2018-10-13 (×2): 100 mg via ORAL
  Filled 2018-10-12 (×2): qty 1

## 2018-10-12 MED ORDER — HYDROXYZINE HCL 25 MG PO TABS: 25.0000 mg | ORAL_TABLET | Freq: Four times a day (QID) | ORAL | Status: DC | PRN

## 2018-10-12 MED ORDER — LISINOPRIL 20 MG PO TABS
20.0000 mg | ORAL_TABLET | Freq: Every day | ORAL | Status: DC
  Administered 2018-10-13: 20 mg via ORAL
  Filled 2018-10-12 (×3): qty 1

## 2018-10-12 MED ORDER — LORATADINE 10 MG PO TABS
10.0000 mg | ORAL_TABLET | Freq: Every day | ORAL | Status: DC
  Administered 2018-10-13: 10 mg via ORAL
  Filled 2018-10-12: qty 1

## 2018-10-12 MED ORDER — OXYCODONE HCL 5 MG PO TABS
5.0000 mg | ORAL_TABLET | ORAL | Status: DC | PRN
  Administered 2018-10-12 (×2): 5 mg via ORAL
  Administered 2018-10-12 – 2018-10-13 (×5): 10 mg via ORAL
  Filled 2018-10-12 (×2): qty 2
  Filled 2018-10-12: qty 1
  Filled 2018-10-12 (×2): qty 2
  Filled 2018-10-12: qty 1
  Filled 2018-10-12: qty 2

## 2018-10-12 MED ORDER — PHENOL 1.4 % MT LIQD: 1.0000 | OROMUCOSAL | Status: DC | PRN

## 2018-10-12 MED ORDER — INSULIN ASPART 100 UNIT/ML ~~LOC~~ SOLN
0.0000 [IU] | Freq: Three times a day (TID) | SUBCUTANEOUS | Status: DC
  Administered 2018-10-12 – 2018-10-13 (×2): 3 [IU] via SUBCUTANEOUS

## 2018-10-12 MED ORDER — PROPOFOL 10 MG/ML IV BOLUS
INTRAVENOUS | Status: AC
  Filled 2018-10-12: qty 60

## 2018-10-12 MED ORDER — ONDANSETRON HCL 4 MG/2ML IJ SOLN: 4.0000 mg | Freq: Four times a day (QID) | INTRAMUSCULAR | Status: DC | PRN

## 2018-10-12 MED ORDER — CLONIDINE HCL (ANALGESIA) 100 MCG/ML EP SOLN
EPIDURAL | Status: DC | PRN
  Administered 2018-10-12: 100 ug

## 2018-10-12 MED ORDER — ARTIFICIAL TEARS OP OINT
TOPICAL_OINTMENT | OPHTHALMIC | Status: AC
  Filled 2018-10-12: qty 3.5

## 2018-10-12 MED ORDER — PHENYTOIN SODIUM EXTENDED 100 MG PO CAPS
100.0000 mg | ORAL_CAPSULE | Freq: Three times a day (TID) | ORAL | Status: DC
  Administered 2018-10-12 – 2018-10-13 (×3): 100 mg via ORAL
  Filled 2018-10-12 (×6): qty 1

## 2018-10-12 MED ORDER — HYDROMORPHONE HCL 1 MG/ML IJ SOLN
0.2500 mg | INTRAMUSCULAR | Status: DC | PRN
  Administered 2018-10-12: 11:00:00 0.5 mg via INTRAVENOUS
  Administered 2018-10-12: 11:00:00 0.25 mg via INTRAVENOUS
  Administered 2018-10-12: 11:00:00 0.5 mg via INTRAVENOUS

## 2018-10-12 MED ORDER — HYDROCHLOROTHIAZIDE 25 MG PO TABS
25.0000 mg | ORAL_TABLET | Freq: Every day | ORAL | Status: DC
  Administered 2018-10-12 – 2018-10-13 (×2): 25 mg via ORAL
  Filled 2018-10-12 (×2): qty 1

## 2018-10-12 MED ORDER — ATORVASTATIN CALCIUM 40 MG PO TABS
40.0000 mg | ORAL_TABLET | Freq: Every day | ORAL | Status: DC
  Administered 2018-10-13: 40 mg via ORAL
  Filled 2018-10-12: qty 1

## 2018-10-12 MED ORDER — FENTANYL CITRATE (PF) 100 MCG/2ML IJ SOLN
INTRAMUSCULAR | Status: DC | PRN
  Administered 2018-10-12 (×3): 50 ug via INTRAVENOUS
  Administered 2018-10-12 (×2): 100 ug via INTRAVENOUS
  Administered 2018-10-12 (×2): 50 ug via INTRAVENOUS

## 2018-10-12 MED ORDER — SODIUM CHLORIDE 0.9 % IV SOLN
INTRAVENOUS | Status: DC | PRN
  Administered 2018-10-12: 25 ug/min via INTRAVENOUS

## 2018-10-12 MED ORDER — BUPIVACAINE HCL 0.5 % IJ SOLN
INTRAMUSCULAR | Status: DC | PRN
  Administered 2018-10-12: 30 mL

## 2018-10-12 MED ORDER — POVIDONE-IODINE 10 % EX SWAB
2.0000 "application " | Freq: Once | CUTANEOUS | Status: AC
  Administered 2018-10-12: 2 via TOPICAL

## 2018-10-12 MED ORDER — KETOROLAC TROMETHAMINE 30 MG/ML IJ SOLN
30.0000 mg | Freq: Once | INTRAMUSCULAR | Status: AC | PRN
  Administered 2018-10-12: 10:00:00 30 mg via INTRAVENOUS

## 2018-10-12 MED ORDER — FENTANYL CITRATE (PF) 100 MCG/2ML IJ SOLN
INTRAMUSCULAR | Status: AC
  Filled 2018-10-12: qty 2

## 2018-10-12 MED ORDER — ASPIRIN 81 MG PO CHEW
81.0000 mg | CHEWABLE_TABLET | Freq: Two times a day (BID) | ORAL | Status: DC
  Administered 2018-10-12 – 2018-10-13 (×2): 81 mg via ORAL
  Filled 2018-10-12 (×2): qty 1

## 2018-10-12 MED ORDER — SODIUM CHLORIDE 0.9 % IV SOLN: INTRAVENOUS | Status: DC

## 2018-10-12 MED ORDER — PROMETHAZINE HCL 25 MG/ML IJ SOLN: 6.2500 mg | INTRAMUSCULAR | Status: DC | PRN

## 2018-10-12 MED ORDER — CEFAZOLIN SODIUM-DEXTROSE 2-4 GM/100ML-% IV SOLN
2.0000 g | Freq: Four times a day (QID) | INTRAVENOUS | Status: AC
  Administered 2018-10-12 (×2): 2 g via INTRAVENOUS
  Filled 2018-10-12 (×2): qty 100

## 2018-10-12 MED ORDER — PANTOPRAZOLE SODIUM 40 MG PO TBEC
40.0000 mg | DELAYED_RELEASE_TABLET | Freq: Every day | ORAL | Status: DC
  Administered 2018-10-13: 40 mg via ORAL
  Filled 2018-10-12: qty 1

## 2018-10-12 MED ORDER — BUPIVACAINE HCL (PF) 0.5 % IJ SOLN
INTRAMUSCULAR | Status: AC
  Filled 2018-10-12: qty 30

## 2018-10-12 MED ORDER — DIPHENHYDRAMINE HCL 12.5 MG/5ML PO ELIX: 12.5000 mg | ORAL_SOLUTION | ORAL | Status: DC | PRN

## 2018-10-12 MED ORDER — INSULIN ASPART 100 UNIT/ML ~~LOC~~ SOLN: 0.0000 [IU] | Freq: Every day | SUBCUTANEOUS | Status: DC

## 2018-10-12 MED ORDER — CEFAZOLIN SODIUM-DEXTROSE 2-4 GM/100ML-% IV SOLN
2.0000 g | INTRAVENOUS | Status: AC
  Administered 2018-10-12: 2 g via INTRAVENOUS

## 2018-10-12 MED ORDER — CHLORHEXIDINE GLUCONATE 4 % EX LIQD: 60.0000 mL | Freq: Once | CUTANEOUS | Status: DC

## 2018-10-12 MED ORDER — POLYVINYL ALCOHOL-POVIDONE PF 1.4-0.6 % OP SOLN: 1.0000 [drp] | Freq: Three times a day (TID) | OPHTHALMIC | Status: DC

## 2018-10-12 MED ORDER — ONDANSETRON HCL 4 MG PO TABS: 4.0000 mg | ORAL_TABLET | Freq: Four times a day (QID) | ORAL | Status: DC | PRN

## 2018-10-12 MED ORDER — ONDANSETRON HCL 4 MG/2ML IJ SOLN
INTRAMUSCULAR | Status: DC | PRN
  Administered 2018-10-12: 4 mg via INTRAVENOUS

## 2018-10-12 MED ORDER — MIDAZOLAM HCL 5 MG/5ML IJ SOLN
INTRAMUSCULAR | Status: DC | PRN
  Administered 2018-10-12: 0.5 mg via INTRAVENOUS
  Administered 2018-10-12: 2 mg via INTRAVENOUS
  Administered 2018-10-12: 0.5 mg via INTRAVENOUS

## 2018-10-12 MED ORDER — ROPIVACAINE HCL 7.5 MG/ML IJ SOLN
INTRAMUSCULAR | Status: DC | PRN
  Administered 2018-10-12 (×6): 5 mL via PERINEURAL

## 2018-10-12 MED ORDER — SODIUM CHLORIDE 0.9 % IR SOLN
Status: DC | PRN
  Administered 2018-10-12 (×2): 1000 mL

## 2018-10-12 MED ORDER — BUPIVACAINE IN DEXTROSE 0.75-8.25 % IT SOLN
INTRATHECAL | Status: DC | PRN
  Administered 2018-10-12: 1.6 mL via INTRATHECAL

## 2018-10-12 MED ORDER — METHOCARBAMOL 500 MG PO TABS
500.0000 mg | ORAL_TABLET | Freq: Four times a day (QID) | ORAL | Status: DC | PRN
  Administered 2018-10-12 – 2018-10-13 (×3): 500 mg via ORAL
  Filled 2018-10-12 (×4): qty 1

## 2018-10-12 MED ORDER — LIDOCAINE HCL (CARDIAC) PF 100 MG/5ML IV SOSY
PREFILLED_SYRINGE | INTRAVENOUS | Status: DC | PRN
  Administered 2018-10-12: 100 mg via INTRAVENOUS

## 2018-10-12 MED ORDER — MAGNESIUM CITRATE PO SOLN: 1.0000 | Freq: Once | ORAL | Status: DC | PRN

## 2018-10-12 MED ORDER — PROPOFOL 10 MG/ML IV BOLUS
INTRAVENOUS | Status: DC | PRN
  Administered 2018-10-12: 200 mg via INTRAVENOUS

## 2018-10-12 MED ORDER — CEFAZOLIN SODIUM-DEXTROSE 2-4 GM/100ML-% IV SOLN
INTRAVENOUS | Status: AC
  Administered 2018-10-12: 2000 mg
  Filled 2018-10-12: qty 100

## 2018-10-12 MED ORDER — MEPERIDINE HCL 50 MG/ML IJ SOLN: 6.2500 mg | INTRAMUSCULAR | Status: DC | PRN

## 2018-10-12 MED ORDER — DEXAMETHASONE SODIUM PHOSPHATE 10 MG/ML IJ SOLN
INTRAMUSCULAR | Status: AC
  Filled 2018-10-12: qty 1

## 2018-10-12 MED ORDER — CARBIDOPA-LEVODOPA ER 48.75-195 MG PO CPCR: 1.0000 | ORAL_CAPSULE | Freq: Four times a day (QID) | ORAL | Status: DC

## 2018-10-12 MED ORDER — METHOCARBAMOL 500 MG IVPB - SIMPLE MED
INTRAVENOUS | Status: AC
  Filled 2018-10-12: qty 50

## 2018-10-12 MED ORDER — LACTATED RINGERS IV SOLN: INTRAVENOUS | Status: DC

## 2018-10-12 MED ORDER — MIDAZOLAM HCL 2 MG/2ML IJ SOLN
INTRAMUSCULAR | Status: AC
  Filled 2018-10-12: qty 2

## 2018-10-12 MED ORDER — TRANEXAMIC ACID 1000 MG/10ML IV SOLN
2000.0000 mg | INTRAVENOUS | Status: DC
  Filled 2018-10-12 (×2): qty 20

## 2018-10-12 MED ORDER — TRANEXAMIC ACID 1000 MG/10ML IV SOLN
INTRAVENOUS | Status: DC | PRN
  Administered 2018-10-12: 2000 mg via TOPICAL

## 2018-10-12 MED ORDER — BISACODYL 10 MG RE SUPP: 10.0000 mg | Freq: Every day | RECTAL | Status: DC | PRN

## 2018-10-12 MED ORDER — LACTATED RINGERS IV SOLN
INTRAVENOUS | Status: DC
  Administered 2018-10-12 (×3): via INTRAVENOUS

## 2018-10-12 MED ORDER — PHENYLEPHRINE HCL (PRESSORS) 10 MG/ML IV SOLN
INTRAVENOUS | Status: AC
  Filled 2018-10-12: qty 1

## 2018-10-12 MED ORDER — FENTANYL CITRATE (PF) 250 MCG/5ML IJ SOLN
INTRAMUSCULAR | Status: AC
  Filled 2018-10-12: qty 5

## 2018-10-12 MED ORDER — METOCLOPRAMIDE HCL 5 MG/ML IJ SOLN: 5.0000 mg | Freq: Three times a day (TID) | INTRAMUSCULAR | Status: DC | PRN

## 2018-10-12 MED ORDER — LIDOCAINE 2% (20 MG/ML) 5 ML SYRINGE
INTRAMUSCULAR | Status: AC
  Filled 2018-10-12: qty 5

## 2018-10-12 MED ORDER — POLYVINYL ALCOHOL 1.4 % OP SOLN
1.0000 [drp] | Freq: Three times a day (TID) | OPHTHALMIC | Status: DC
  Administered 2018-10-12 (×2): 1 [drp] via OPHTHALMIC
  Filled 2018-10-12: qty 15

## 2018-10-12 MED ORDER — MENTHOL 3 MG MT LOZG: 1.0000 | LOZENGE | OROMUCOSAL | Status: DC | PRN

## 2018-10-12 MED ORDER — HYDROMORPHONE HCL 1 MG/ML IJ SOLN
INTRAMUSCULAR | Status: AC
  Filled 2018-10-12: qty 1

## 2018-10-12 MED ORDER — KETOROLAC TROMETHAMINE 30 MG/ML IJ SOLN
INTRAMUSCULAR | Status: AC
  Filled 2018-10-12: qty 1

## 2018-10-12 MED ORDER — ALUM & MAG HYDROXIDE-SIMETH 200-200-20 MG/5ML PO SUSP: 30.0000 mL | ORAL | Status: DC | PRN

## 2018-10-12 MED ORDER — CARBIDOPA-LEVODOPA ER 50-200 MG PO TBCR
1.0000 | EXTENDED_RELEASE_TABLET | Freq: Four times a day (QID) | ORAL | Status: DC
  Administered 2018-10-12 – 2018-10-13 (×4): 1 via ORAL
  Filled 2018-10-12 (×9): qty 1

## 2018-10-12 MED ORDER — HYDROMORPHONE HCL 1 MG/ML IJ SOLN
1.0000 mg | INTRAMUSCULAR | Status: DC | PRN
  Administered 2018-10-13 (×2): 1 mg via INTRAVENOUS
  Filled 2018-10-12 (×2): qty 1

## 2018-10-12 MED ORDER — MAGNESIUM HYDROXIDE 400 MG/5ML PO SUSP: 30.0000 mL | Freq: Every day | ORAL | Status: DC | PRN

## 2018-10-12 SURGICAL SUPPLY — 56 items
BAG DECANTER FOR FLEXI CONT (MISCELLANEOUS) ×3 IMPLANT
BAG ZIPLOCK 12X15 (MISCELLANEOUS) ×3 IMPLANT
BLADE SAGITTAL 25.0X1.19X90 (BLADE) ×2 IMPLANT
BLADE SAGITTAL 25.0X1.19X90MM (BLADE) ×1
BNDG ELASTIC 6X5.8 VLCR STR LF (GAUZE/BANDAGES/DRESSINGS) ×2 IMPLANT
BNDG GAUZE ELAST 4 BULKY (GAUZE/BANDAGES/DRESSINGS) ×3 IMPLANT
BOWL SMART MIX CTS (DISPOSABLE) ×3 IMPLANT
CEMENT HV SMART SET (Cement) ×6 IMPLANT
CEMENT TIBIA MBT SIZE 4 (Knees) IMPLANT
COMP FEM CEM STD+ RT LCS (Orthopedic Implant) ×3 IMPLANT
COMP PATELLA PEGX3 CEM STAN+ (Knees) ×3 IMPLANT
COMPONENT FEM CEM STD+ RT LCS (Orthopedic Implant) IMPLANT
COMPONENT PTLA PEGX3 CEM STAN+ (Knees) IMPLANT
COVER SURGICAL LIGHT HANDLE (MISCELLANEOUS) ×3 IMPLANT
COVER WAND RF STERILE (DRAPES) IMPLANT
CUFF TOURN SGL QUICK 34 (TOURNIQUET CUFF) ×2
CUFF TRNQT CYL 34X4.125X (TOURNIQUET CUFF) ×2 IMPLANT
DECANTER SPIKE VIAL GLASS SM (MISCELLANEOUS) ×3 IMPLANT
DRAPE IMP U-DRAPE 54X76 (DRAPES) ×3 IMPLANT
DRAPE SHEET LG 3/4 BI-LAMINATE (DRAPES) ×6 IMPLANT
DRSG ADAPTIC 3X8 NADH LF (GAUZE/BANDAGES/DRESSINGS) ×3 IMPLANT
DRSG PAD ABDOMINAL 8X10 ST (GAUZE/BANDAGES/DRESSINGS) ×3 IMPLANT
DURAPREP 26ML APPLICATOR (WOUND CARE) ×6 IMPLANT
ELECT REM PT RETURN 15FT ADLT (MISCELLANEOUS) ×3 IMPLANT
GAUZE SPONGE 4X4 12PLY STRL (GAUZE/BANDAGES/DRESSINGS) ×3 IMPLANT
GLOVE BIOGEL PI IND STRL 8 (GLOVE) ×1 IMPLANT
GLOVE BIOGEL PI IND STRL 8.5 (GLOVE) ×1 IMPLANT
GLOVE BIOGEL PI INDICATOR 8 (GLOVE) ×2
GLOVE BIOGEL PI INDICATOR 8.5 (GLOVE) ×6
GLOVE ECLIPSE 8.0 STRL XLNG CF (GLOVE) ×8 IMPLANT
GLOVE ECLIPSE 8.5 STRL (GLOVE) ×8 IMPLANT
GOWN STRL REUS W/ TWL LRG LVL3 (GOWN DISPOSABLE) ×1 IMPLANT
GOWN STRL REUS W/TWL 2XL LVL3 (GOWN DISPOSABLE) ×3 IMPLANT
GOWN STRL REUS W/TWL LRG LVL3 (GOWN DISPOSABLE) ×2
HANDPIECE INTERPULSE COAX TIP (DISPOSABLE) ×2
HOLDER FOLEY CATH W/STRAP (MISCELLANEOUS) ×2 IMPLANT
INSERT TIB LCS RP STD+ 10 (Knees) ×2 IMPLANT
KIT TURNOVER KIT A (KITS) IMPLANT
MANIFOLD NEPTUNE II (INSTRUMENTS) ×3 IMPLANT
NS IRRIG 1000ML POUR BTL (IV SOLUTION) ×3 IMPLANT
PACK TOTAL KNEE CUSTOM (KITS) ×3 IMPLANT
PADDING CAST COTTON 6X4 STRL (CAST SUPPLIES) ×6 IMPLANT
PIN STEINMAN FIXATION KNEE (PIN) ×2 IMPLANT
PROTECTOR NERVE ULNAR (MISCELLANEOUS) ×3 IMPLANT
SET HNDPC FAN SPRY TIP SCT (DISPOSABLE) ×1 IMPLANT
STAPLER VISISTAT 35W (STAPLE) ×3 IMPLANT
SUT BONE WAX W31G (SUTURE) ×3 IMPLANT
SUT ETHIBOND NAB CT1 #1 30IN (SUTURE) ×6 IMPLANT
SUT MNCRL AB 3-0 PS2 18 (SUTURE) ×3 IMPLANT
SUT VIC AB 2-0 PS2 27 (SUTURE) ×3 IMPLANT
TIBIA MBT CEMENT SIZE 4 (Knees) ×3 IMPLANT
TRAY FOLEY MTR SLVR 16FR STAT (SET/KITS/TRAYS/PACK) ×3 IMPLANT
UNDERPAD 30X30 (UNDERPADS AND DIAPERS) ×3 IMPLANT
WATER STERILE IRR 1000ML POUR (IV SOLUTION) ×6 IMPLANT
WRAP KNEE MAXI GEL POST OP (GAUZE/BANDAGES/DRESSINGS) ×3 IMPLANT
YANKAUER SUCT BULB TIP 10FT TU (MISCELLANEOUS) ×2 IMPLANT

## 2018-10-12 NOTE — Anesthesia Postprocedure Evaluation (Signed)
Anesthesia Post Note  Patient: Jermani Eberlein  Procedure(s) Performed: RIGHT TOTAL KNEE ARTHROPLASTY (Right )     Patient location during evaluation: PACU Anesthesia Type: General Level of consciousness: sedated Pain management: pain level controlled Vital Signs Assessment: post-procedure vital signs reviewed and stable Respiratory status: spontaneous breathing Cardiovascular status: stable Postop Assessment: no headache, no backache, patient able to bend at knees and no apparent nausea or vomiting Anesthetic complications: no    Last Vitals:  Vitals:   10/12/18 1015 10/12/18 1030  BP: 119/83 100/84  Pulse: 84 89  Resp: 13 14  Temp:    SpO2: 99% 97%    Last Pain:  Vitals:   10/12/18 1040  TempSrc:   PainSc: Asleep   Pain Goal: Patients Stated Pain Goal: 4 (10/12/18 0604)                 Huston Foley

## 2018-10-12 NOTE — Op Note (Signed)
NAME: Danny Hampton, Danny Hampton MEDICAL RECORD TO:[id number] ACCOUNT [id number] DATE OF BIRTH:[birth year] FACILITY: [facility] LOCATION: [place], [place]  OPERATIVE REPORT  DATE OF PROCEDURE:  10/12/2018  PREOPERATIVE DIAGNOSIS:  Osteoarthritis, right knee.  POSTOPERATIVE DIAGNOSIS:  Osteoarthritis, right knee.  PROCEDURE:  Right total knee replacement.  SURGEON:  Joni Fears, MD  ASSISTANT:  Biagio Borg, PA-C  ANESTHESIA:  Failed spinal with LMA and adductor canal block.  COMPLICATIONS:  None.  COMPONENTS:  DePuy LCS standard plus femoral component, a #4 rotating tibial platform with a 10 mm polyethylene bridging bearing and metal-backed 3-peg rotating patella.  Components were secured with polymethyl methacrylate.  DESCRIPTION OF PROCEDURE:  The patient was met in the holding area and identified the right knee as appropriate operative site and marked it accordingly.  Anesthesia performed an adductor canal block.  Any questions were answered.  The patient was then transported to room #5.  Spinal anesthesia was induced by anesthesia.  Apparently, it was not fully functional.  Accordingly, he was placed under general LMA anesthesia without difficulty.  He was placed comfortably in the supine  position on the operating table.  Foley catheter was inserted and the urine was clear.  A thigh tourniquet was applied to the right thigh.  The right leg was then prepped with chlorhexidine scrub and DuraPrep x2 from the tourniquet to the tips of the toes.  Sterile draping was performed.  Timeout was called.  The right lower extremity was then elevated and Esmarch exsanguinated with a proximal tourniquet at 325 mmHg.  A longitudinal incision was then made centered about the patella extending from the superior pouch to the tibial tubercle.  Via sharp dissection, incision was carried down to subcutaneous tissue.  First layer of capsule was incised in the midline.  A   medial parapatellar incision was then made with the Bovie.  The joint was entered.  There was minimal effusion.  Patella was everted 180 degrees laterally, the knee flexed to 90 degrees.  There were small to moderate size osteophytes along the medial and lateral femoral condyle.  These were removed.  Any synovitis was resected.  I measured a standard plus femoral  component.  First bony cut was then made transversely in the proximal tibia with a 7-degree angle of declination.  After each bony cut on the tibia and the femur, I checked the alignment with the external alignment jig.  Subsequent cuts were then made on the femur using the standard plus femoral jig.  I used a 4-degree distal femoral valgus cut.  Laminar spreaders were inserted in the medial and lateral compartment.  I removed medial and lateral menisci.  ACL and PCL  were also removed.  There were no loose bodies.  A 3/4-inch curved osteotome was used to remove any osteophytes from the posterior femoral condyle.  My flexion and extension gaps were perfectly symmetrical at 10 mm.  MCL and LCL remained intact throughout the procedure.  Finishing guide was then applied to the femur to obtain the tapering cuts in the center hole.  Retractors then placed about the tibia.  It was advanced anteriorly.  I measured a #4 tibial tray.  This was pinned in place with the external guide.  Center hole was then made followed by the keeled.  With the tibial jig in place, the 10 mm polyethylene  bridging bearing was applied.  This was followed by the standard plus trial femoral component.  The entire construct was reduced.  Through a full  range of motion, I had full extension, flexion over 120 degrees without instability.  MCL and LCL function  intact.  Patella was prepared by removing approximately 11 mm of bone, leaving about 13.5 mm patella thickness.  Three holes were then made with the trial jig.  The trial patella inserted through a full range  of motion remained stable.  The trial components were then removed.  The joint was copiously irrigated with saline solution.  The final components were then impacted with polymethyl methacrylate.  We initially applied the #4 tibial tray, cleaned any extraneous methacrylate, and  then applied the 10 mm polyethylene bridging bearing.  We then applied the femoral component.  They were all impacted in place and the knee placed in extension.  Any further extraneous methacrylate was removed.  Patella was applied with polymethyl methacrylate and a patellar clamp.  At approximately 16 minutes, the methacrylate had matured.  During this time, we irrigated the joint and then infiltrated the knee with 0.25% Marcaine with epinephrine.  The tourniquet was then deflated at about 68 minutes.  Gross bleeders were Bovie coagulated.  We applied the tranexamic acid topically under compression without difficulty.  The deep capsule was then closed with a running #1 Ethibond, superficial capsule with a running 0 Vicryl, and the subcu with 3-0 Monocryl.  Skin closed with skin clips.  A sterile bulky dressing was applied followed by the patient's support stocking.  The patient tolerated the procedure well without complications.  LN/NUANCE  D:10/12/2018 T:10/12/2018 JOB:[id number]

## 2018-10-12 NOTE — Anesthesia Procedure Notes (Signed)
Spinal  Patient location during procedure: OR Start time: 10/12/2018 7:30 AM End time: 10/12/2018 7:34 AM Staffing Anesthesiologist: Lyn Hollingshead, MD Performed: anesthesiologist  Preanesthetic Checklist Completed: patient identified, site marked, surgical consent, pre-op evaluation, timeout performed, IV checked, risks and benefits discussed and monitors and equipment checked Spinal Block Patient position: sitting Prep: site prepped and draped and DuraPrep Patient monitoring: continuous pulse ox and blood pressure Approach: midline Location: L3-4 Injection technique: single-shot Needle Needle type: Pencan  Needle gauge: 24 G Needle length: 10 cm Needle insertion depth: 5 cm

## 2018-10-12 NOTE — Anesthesia Procedure Notes (Signed)
Procedure Name: LMA Insertion Date/Time: 10/12/2018 7:40 AM Performed by: Lissa Morales, CRNA Pre-anesthesia Checklist: Patient identified, Emergency Drugs available, Suction available and Patient being monitored Patient Re-evaluated:Patient Re-evaluated prior to induction Oxygen Delivery Method: Circle system utilized Preoxygenation: Pre-oxygenation with 100% oxygen Induction Type: IV induction Ventilation: Mask ventilation without difficulty LMA: LMA with gastric port inserted LMA Size: 5.0 Tube type: Oral Number of attempts: 1 Airway Equipment and Method: Oral airway Placement Confirmation: positive ETCO2 Tube secured with: Tape Dental Injury: Teeth and Oropharynx as per pre-operative assessment

## 2018-10-12 NOTE — Evaluation (Signed)
Physical Therapy Evaluation Patient Details Name: Danny Hampton MRN: [id number] DOB: Feb 17, 1956 Today's Date: 10/12/2018   History of Present Illness  R TKA, PMH of recent back surgery 08/21/18  Clinical Impression  Pt is s/p TKA resulting in the deficits listed below (see PT Problem List). Pt ambulated 5' with RW, initiated TKA HEP. Good progress expected.  Pt will benefit from skilled PT to increase their independence and safety with mobility to allow discharge to the venue listed below.      Follow Up Recommendations Follow surgeon's recommendation for DC plan and follow-up therapies    Equipment Recommendations  3in1 (PT)    Recommendations for Other Services       Precautions / Restrictions Precautions Precautions: Back;Knee Precaution Comments: recent back surgery in June 2020 Required Braces or Orthoses: Spinal Brace Restrictions Weight Bearing Restrictions: Yes RLE Weight Bearing: Partial weight bearing RLE Partial Weight Bearing Percentage or Pounds: 50%      Mobility  Bed Mobility Overal bed mobility: Needs Assistance Bed Mobility: Rolling;Sidelying to Sit Rolling: Min guard Sidelying to sit: Min assist       General bed mobility comments: log roll to L (2* recent back surgery), min A to raise trunk  Transfers Overall transfer level: Needs assistance Equipment used: Rolling walker (2 wheeled) Transfers: Sit to/from Stand Sit to Stand: Min assist         General transfer comment: VCs hand placement, min A to rise  Ambulation/Gait Ambulation/Gait assistance: Min guard Gait Distance (Feet): 50 Feet Assistive device: Rolling walker (2 wheeled) Gait Pattern/deviations: Step-to pattern;Decreased weight shift to right;Decreased stride length Gait velocity: decr   General Gait Details: VCs sequencing, no loss of balance  Stairs            Wheelchair Mobility    Modified Rankin (Stroke Patients Only)       Balance Overall balance  assessment: Modified Independent                                           Pertinent Vitals/Pain Pain Assessment: 0-10 Pain Score: 6  Pain Location: R knee with activity Pain Descriptors / Indicators: Sore Pain Intervention(s): Limited activity within patient's tolerance;Monitored during session;Premedicated before session;Ice applied    Home Living Family/patient expects to be discharged to:: Private residence Living Arrangements: Spouse/significant other Available Help at Discharge: Family;Available 24 hours/day   Home Access: Stairs to enter   Entrance Stairs-Number of Steps: 1 Home Layout: One level Home Equipment: Walker - 2 wheels;Cane - single point;Grab bars - tub/shower      Prior Function Level of Independence: Independent               Hand Dominance        Extremity/Trunk Assessment   Upper Extremity Assessment Upper Extremity Assessment: Overall WFL for tasks assessed    Lower Extremity Assessment Lower Extremity Assessment: RLE deficits/detail RLE Deficits / Details: SLR 3/5, knee ext -3/5, AAROM knee 10-50* RLE Sensation: WNL RLE Coordination: WNL    Cervical / Trunk Assessment Cervical / Trunk Assessment: Normal  Communication   Communication: No difficulties  Cognition Arousal/Alertness: Awake/alert Behavior During Therapy: WFL for tasks assessed/performed Overall Cognitive Status: Within Functional Limits for tasks assessed  General Comments      Exercises Total Joint Exercises Ankle Circles/Pumps: AROM;Both;10 reps;Supine Quad Sets: AROM;Both;5 reps;Supine Straight Leg Raises: AROM;Right;Supine(x 2 reps) Goniometric ROM: 10-50* aAROM R knee   Assessment/Plan    PT Assessment Patient needs continued PT services  PT Problem List Decreased strength;Decreased mobility;Decreased range of motion;Decreased activity tolerance;Pain       PT Treatment  Interventions DME instruction;Gait training;Functional mobility training;Therapeutic exercise;Therapeutic activities;Stair training;Patient/family education    PT Goals (Current goals can be found in the Care Plan section)  Acute Rehab PT Goals Patient Stated Goal: to be able to walk on dirt trails PT Goal Formulation: With patient Time For Goal Achievement: 10/19/18 Potential to Achieve Goals: Good    Frequency 7X/week   Barriers to discharge        Co-evaluation               AM-PAC PT "6 Clicks" Mobility  Outcome Measure Help needed turning from your back to your side while in a flat bed without using bedrails?: A Little Help needed moving from lying on your back to sitting on the side of a flat bed without using bedrails?: A Little Help needed moving to and from a bed to a chair (including a wheelchair)?: A Little Help needed standing up from a chair using your arms (e.g., wheelchair or bedside chair)?: A Little Help needed to walk in hospital room?: A Little Help needed climbing 3-5 steps with a railing? : A Lot 6 Click Score: 17    End of Session Equipment Utilized During Treatment: Gait belt Activity Tolerance: Patient tolerated treatment well Patient left: in chair;with call bell/phone within reach Nurse Communication: Mobility status PT Visit Diagnosis: Difficulty in walking, not elsewhere classified (R26.2);Pain Pain - Right/Left: Right Pain - part of body: Knee    Time: 9983-3825 PT Time Calculation (min) (ACUTE ONLY): 24 min   Charges:   PT Evaluation $PT Eval Low Complexity: 1 Low PT Treatments $Gait Training: 8-22 mins       Blondell Reveal Kistler PT 10/12/2018  Acute Rehabilitation Services Pager [phone] Office [phone]

## 2018-10-12 NOTE — Op Note (Signed)
PATIENT ID:      Danny Hampton  MRN:     [id number] DOB/AGE:    [birth year] / 63 y.o.       OPERATIVE REPORT    DATE OF PROCEDURE:  10/12/2018       PREOPERATIVE DIAGNOSIS:   RIGHT KNEE OSTEOARTHRITIS                                                       Estimated body mass index is 28.66 kg/m as calculated from the following:   Height as of this encounter: 5\' 7"  (1.702 m).   Weight as of this encounter: 83 kg.     POSTOPERATIVE DIAGNOSIS:   RIGHT KNEE OSTEOARTHRITIS                                                                     Estimated body mass index is 28.66 kg/m as calculated from the following:   Height as of this encounter: 5\' 7"  (1.702 m).   Weight as of this encounter: 83 kg.     PROCEDURE:  Procedure(s): RIGHT TOTAL KNEE ARTHROPLASTY      SURGEON:  Joni Fears, MD    ASSISTANT:   Biagio Borg, PA-C   (Present and scrubbed throughout the case, critical for assistance with exposure, retraction, instrumentation, and closure.)          ANESTHESIA: local, regional and general     DRAINS: none :      TOURNIQUET TIME:  Total Tourniquet Time Documented: Thigh (Right) - 68 minutes Total: Thigh (Right) - 68 minutes     COMPLICATIONS:  None   CONDITION:  stable  PROCEDURE IN DETAIL: [place] 10/12/2018, 9:23 AM

## 2018-10-12 NOTE — Plan of Care (Signed)

## 2018-10-12 NOTE — H&P (Signed)
The recent History & Physical has been reviewed. I have personally examined the patient today. There is no interval change to the documented History & Physical. The patient would like to proceed with the procedure.  Garald Balding 10/12/2018,  7:24 AM

## 2018-10-12 NOTE — Transfer of Care (Signed)
Immediate Anesthesia Transfer of Care Note  Patient: Danny Hampton  Procedure(s) Performed: RIGHT TOTAL KNEE ARTHROPLASTY (Right )  Patient Location: PACU  Anesthesia Type:General  Level of Consciousness: awake, alert , oriented and patient cooperative  Airway & Oxygen Therapy: Patient Spontanous Breathing and Patient connected to face mask oxygen  Post-op Assessment: Report given to RN, Post -op Vital signs reviewed and stable and Patient moving all extremities X 4  Post vital signs: stable  Last Vitals:  Vitals Value Taken Time  BP 120/91 10/12/18 1000  Temp    Pulse 86 10/12/18 1004  Resp 11 10/12/18 1004  SpO2 100 % 10/12/18 1004  Vitals shown include unvalidated device data.  Last Pain:  Vitals:   10/12/18 0604  TempSrc:   PainSc: 5       Patients Stated Pain Goal: 4 (20/25/42 7062)  Complications: No apparent anesthesia complications

## 2018-10-12 NOTE — Anesthesia Procedure Notes (Signed)
Anesthesia Regional Block: Adductor canal block   Pre-Anesthetic Checklist: ,, timeout performed, Correct Patient, Correct Site, Correct Laterality, Correct Procedure, Correct Position, site marked, Risks and benefits discussed,  Surgical consent,  Pre-op evaluation,  At surgeon's request and post-op pain management  Laterality: Lower and Right  Prep: chloraprep       Needles:  Injection technique: Single-shot  Needle Type: Echogenic Stimulator Needle     Needle Length: 10cm  Needle Gauge: 21   Needle insertion depth: 3 cm   Additional Needles:   Procedures:,,,, ultrasound used (permanent image in chart),,,,  Narrative:  Start time: 10/12/2018 6:54 AM End time: 10/12/2018 7:04 AM Injection made incrementally with aspirations every 5 mL. Anesthesiologist: Lyn Hollingshead, MD

## 2018-10-13 ENCOUNTER — Encounter (HOSPITAL_COMMUNITY): Payer: Self-pay | Admitting: Orthopaedic Surgery

## 2018-10-13 ENCOUNTER — Telehealth: Payer: Self-pay | Admitting: Orthopaedic Surgery

## 2018-10-13 ENCOUNTER — Other Ambulatory Visit: Payer: Self-pay | Admitting: Orthopaedic Surgery

## 2018-10-13 LAB — BASIC METABOLIC PANEL
Anion gap: 5 (ref 5–15)
BUN: 10 mg/dL (ref 8–23)
CO2: 29 mmol/L (ref 22–32)
Calcium: 8.2 mg/dL — ABNORMAL LOW (ref 8.9–10.3)
Chloride: 103 mmol/L (ref 98–111)
Creatinine, Ser: 0.64 mg/dL (ref 0.61–1.24)
GFR calc Af Amer: >60 mL/min (ref >60)
GFR calc non Af Amer: >60 mL/min (ref >60)
Glucose, Bld: 130 mg/dL — ABNORMAL HIGH (ref 70–99)
Potassium: 4.5 mmol/L (ref 3.5–5.1)
Sodium: 137 mmol/L (ref 135–145)

## 2018-10-13 LAB — CBC
HCT: 28.8 % — ABNORMAL LOW (ref 39.0–52.0)
Hemoglobin: 9.2 g/dL — ABNORMAL LOW (ref 13.0–17.0)
MCH: 31.3 pg (ref 26.0–34.0)
MCHC: 31.9 g/dL (ref 30.0–36.0)
MCV: 98 fL (ref 80.0–100.0)
Platelets: 153 10*3/uL (ref 150–400)
RBC: 2.94 MIL/uL — ABNORMAL LOW (ref 4.22–5.81)
RDW: 12.8 % (ref 11.5–15.5)
WBC: 6.5 10*3/uL (ref 4.0–10.5)
nRBC: 0 % (ref 0.0–0.2)

## 2018-10-13 LAB — GLUCOSE, CAPILLARY: Glucose-Capillary: 181 mg/dL — ABNORMAL HIGH (ref 70–99)

## 2018-10-13 MED ORDER — OXYCODONE HCL 5 MG PO TABS: 5.0000 mg | ORAL_TABLET | Freq: Four times a day (QID) | ORAL | 0 refills | Status: DC | PRN

## 2018-10-13 MED ORDER — METHOCARBAMOL 500 MG PO TABS: 500.0000 mg | ORAL_TABLET | Freq: Three times a day (TID) | ORAL | 0 refills | Status: DC | PRN

## 2018-10-13 MED ORDER — ASPIRIN 81 MG PO CHEW: 81.0000 mg | CHEWABLE_TABLET | Freq: Two times a day (BID) | ORAL | Status: DC

## 2018-10-13 NOTE — TOC Progression Note (Addendum)
Transition of Care [facility]) - Progression Note    Patient Details  Name: Danny Hampton MRN: [id number] Date of Birth: November 07, 1955  [facility] of [facility] CM/SW [place], LCSW Phone Number: 10/13/2018, 11:08 AM  Clinical Narrative:    CSW reached out to the [place], [place] ext.[phone] where the patient is service connected. CSW spoke with Rep Maudie Mercury to assist with arranging [place] sent in requested documents  Home Health orders and H&P. CSW will follow up with Maudie Mercury.   Documents received at [facility], [place] will reach out to the patient with Home Health details.Patient notified.    Barriers to Discharge: Insurance Authorization([employer])  Expected Discharge Plan and [place] /Patient has DME                                            Social Determinants of Health (SDOH) Interventions    Readmission Risk Interventions No flowsheet data found.

## 2018-10-13 NOTE — Progress Notes (Signed)
Physical Therapy Treatment Patient Details Name: Danny Hampton MRN: [id number] DOB: [birth year] Today's Date: 10/13/2018    History of Present Illness R TKA; PMH of recent back surgery 08/21/18    PT Comments    Pt is progressing well with mobility, he ambulated 150' with RW, completed stair training, and demonstrates good understanding of TKA HEP. He is ready to DC home from PT standpoint.   Follow Up Recommendations  Follow surgeon's recommendation for DC plan and follow-up therapies     Equipment Recommendations  3in1 (PT)    Recommendations for Other Services       Precautions / Restrictions Precautions Precautions: Back;Knee Precaution Booklet Issued: Yes (comment) Precaution Comments: recent back surgery in June 2020; reviewed no pillow under knee Required Braces or Orthoses: Spinal Brace Restrictions Weight Bearing Restrictions: Yes RLE Weight Bearing: Partial weight bearing RLE Partial Weight Bearing Percentage or Pounds: 50%    Mobility  Bed Mobility Overal bed mobility: Modified Independent Bed Mobility: Rolling;Sidelying to Sit Rolling: Modified independent (Device/Increase time) Sidelying to sit: Modified independent (Device/Increase time)       General bed mobility comments: log roll to L (2* recent back surgery)  Transfers Overall transfer level: Needs assistance Equipment used: Rolling walker (2 wheeled) Transfers: Sit to/from Stand Sit to Stand: Supervision         General transfer comment: VCs hand placement  Ambulation/Gait Ambulation/Gait assistance: Supervision Gait Distance (Feet): 150 Feet Assistive device: Rolling walker (2 wheeled) Gait Pattern/deviations: Step-to pattern;Decreased weight shift to right;Decreased stride length Gait velocity: decr   General Gait Details: VCs sequencing, no loss of balance   Stairs Stairs: Yes Stairs assistance: Min guard Stair Management: No rails;Backwards;Step to pattern Number of  Stairs: 1 General stair comments: 1 step x 2 trials, VCs for hand placement   Wheelchair Mobility    Modified Rankin (Stroke Patients Only)       Balance Overall balance assessment: Modified Independent                                          Cognition Arousal/Alertness: Awake/alert Behavior During Therapy: WFL for tasks assessed/performed Overall Cognitive Status: Within Functional Limits for tasks assessed                                        Exercises Total Joint Exercises Ankle Circles/Pumps: AROM;Both;10 reps;Supine Quad Sets: AROM;Both;5 reps;Supine Short Arc Quad: AROM;Left;10 reps;Supine Heel Slides: AAROM;Left;10 reps;Supine Hip ABduction/ADduction: AROM;Left;10 reps;Supine Straight Leg Raises: Right;Supine;AAROM;5 reps(x 2 reps) Long Arc Quad: AAROM;AROM;Right;10 reps;Seated Knee Flexion: AAROM;AROM;Right;10 reps;Seated Goniometric ROM: 5-50* AAROM R knee    General Comments        Pertinent Vitals/Pain Pain Score: 6  Pain Location: R knee with activity Pain Descriptors / Indicators: Sharp Pain Intervention(s): Limited activity within patient's tolerance;Monitored during session;Premedicated before session;RN gave pain meds during session;Ice applied    Home Living                      Prior Function            PT Goals (current goals can now be found in the care plan section) Acute Rehab PT Goals Patient Stated Goal: to be able to walk on dirt trails, ride motorcycle PT Goal Formulation: With patient Time  For Goal Achievement: 10/19/18 Potential to Achieve Goals: Good Progress towards PT goals: Progressing toward goals    Frequency    7X/week      PT Plan Current plan remains appropriate    Co-evaluation              AM-PAC PT "6 Clicks" Mobility   Outcome Measure  Help needed turning from your back to your side while in a flat bed without using bedrails?: None Help needed moving  from lying on your back to sitting on the side of a flat bed without using bedrails?: None Help needed moving to and from a bed to a chair (including a wheelchair)?: None Help needed standing up from a chair using your arms (e.g., wheelchair or bedside chair)?: None Help needed to walk in hospital room?: None Help needed climbing 3-5 steps with a railing? : A Little 6 Click Score: 23    End of Session Equipment Utilized During Treatment: Gait belt Activity Tolerance: Patient tolerated treatment well Patient left: in chair;with call bell/phone within reach Nurse Communication: Mobility status PT Visit Diagnosis: Difficulty in walking, not elsewhere classified (R26.2);Pain Pain - Right/Left: Right Pain - part of body: Knee     Time: 6283-1517 PT Time Calculation (min) (ACUTE ONLY): 36 min  Charges:  $Gait Training: 8-22 mins $Therapeutic Exercise: 8-22 mins                     Danny Hampton PT 10/13/2018  Acute Rehabilitation Services Pager [phone] Office [phone]

## 2018-10-13 NOTE — Discharge Summary (Signed)
Joni Fears, MD   Biagio Borg, PA-C [address] [place], [place]  [address]                             [phone]  PATIENT ID: Danny Hampton        MRN:  [id number]          DOB/AGE: 1955-08-09 / 63 y.o.    DISCHARGE SUMMARY  ADMISSION DATE:    10/12/2018 DISCHARGE DATE:   10/13/2018   ADMISSION DIAGNOSIS: RIGHT KNEE OSTEOARTHRITIS    DISCHARGE DIAGNOSIS:  RIGHT KNEE OSTEOARTHRITIS    ADDITIONAL DIAGNOSIS: Active Problems:   Total knee replacement status, right  Past Medical History:  Diagnosis Date   Acid reflux    Anxiety    Arthritis    Cancer ([place])    skin cancer   Complication of anesthesia    Diabetes mellitus without complication ([place])    Family history of adverse reaction to anesthesia    Hypertension    Myotonia congenita     PROCEDURE: Procedure(s): RIGHT TOTAL KNEE ARTHROPLASTY  on 10/12/2018  CONSULTS: none    HISTORY: Danny Hampton, 63 y.o. male, has a history of pain and functional disability in the right knee due to arthritis and has failed non-surgical conservative treatments for greater than 12 weeks to includeNSAID's and/or analgesics, corticosteriod injections, viscosupplementation injections and activity modification.  Onset of symptoms was gradual, starting 10 years ago with gradually worsening course since that time. The patient noted no past surgery on the right knee(s).  Patient currently rates pain in the right knee(s) at 7 out of 10 with activity. Patient has night pain, worsening of pain with activity and weight bearing, pain that interferes with activities of daily living, crepitus and joint swelling.  Patient has evidence of subchondral cysts and sclerosing by imaging studies.  There is no active infection.  HOSPITAL COURSE:  Danny Hampton is a 63 y.o. admitted on 10/12/2018 and found to have a diagnosis of RIGHT KNEE OSTEOARTHRITIS.  After appropriate laboratory studies were obtained  they were taken to the  operating room on 10/12/2018 and underwent  Procedure(s): RIGHT TOTAL KNEE ARTHROPLASTY  .   They were given perioperative antibiotics:  Anti-infectives (From admission, onward)   Start     Dose/Rate Route Frequency Ordered Stop   10/12/18 1330  ceFAZolin (ANCEF) IVPB 2g/100 mL premix     2 g 200 mL/hr over 30 Minutes Intravenous Every 6 hours 10/12/18 1158 10/12/18 1931   10/12/18 0716  ceFAZolin (ANCEF) 2-4 GM/100ML-% IVPB    Note to Pharmacy: Enrigue Catena   : cabinet override      10/12/18 0716 10/12/18 1859   10/12/18 0600  ceFAZolin (ANCEF) IVPB 2g/100 mL premix     2 g 200 mL/hr over 30 Minutes Intravenous On call to O.R. 10/12/18 0543 10/12/18 0732    .  Tolerated the procedure well.  Placed with a foley intraoperatively.     Toradol was given post op.  POD #1, allowed out of bed to a chair.  PT for ambulation and exercise program.  Foley D/C'd in morning.  IV saline locked.  O2 discontionued.  The remainder of the hospital course was dedicated to ambulation and strengthening.   The patient was discharged on 1 Day Post-Op in  Stable condition.  Blood products given:none  DIAGNOSTIC STUDIES: Recent vital signs:  Patient Vitals for the past 24 hrs:  BP  Temp Temp src Pulse Resp SpO2  10/13/18 0900 128/84 -- -- -- -- --  10/13/18 0454 138/88 98.2 F (36.8 C) -- 82 20 100 %  10/13/18 0031 117/80 98.4 F (36.9 C) Oral 85 18 100 %  10/12/18 2016 114/73 98.7 F (37.1 C) Oral 80 20 99 %  10/12/18 1706 107/62 98.9 F (37.2 C) Oral 85 16 99 %  10/12/18 1504 115/71 98.2 F (36.8 C) Oral 80 16 95 %  10/12/18 1417 119/90 98.1 F (36.7 C) Oral 91 17 95 %  10/12/18 1301 124/77 97.6 F (36.4 C) Oral 72 16 97 %       Recent laboratory studies: Recent Labs    10/07/18 0923 10/13/18 0452  WBC 7.7 6.5  HGB 12.6* 9.2*  HCT 38.8* 28.8*  PLT 216 153   Recent Labs    10/07/18 0923 10/13/18 0452  NA 139 137  K 4.4 4.5  CL 101 103  CO2 26 29  BUN 14 10  CREATININE 0.62 0.64   GLUCOSE 118* 130*  CALCIUM 9.9 8.2*   Lab Results  Component Value Date   INR 0.9 10/07/2018     Recent Radiographic Studies :  Dg Chest 2 View  Result Date: 10/07/2018 CLINICAL DATA:  Preop total knee replacement. EXAM: CHEST - 2 VIEW COMPARISON:  Chest x-ray dated 07/04/2015. FINDINGS: Heart size and mediastinal contours are within normal limits. Lungs are clear. No pleural effusion. Osseous structures about the chest are unremarkable. IMPRESSION: No active cardiopulmonary disease. Electronically Signed   By: Franki Cabot M.D.   On: 10/07/2018 15:38    DISCHARGE INSTRUCTIONS: Discharge Instructions    CPM   Complete by: As directed    Continuous passive motion machine (CPM):      Use the CPM from 0 to 60 for 6-8 hours per day.      You may increase by 5-10 degrees per day.  You may break it up into 2 or 3 sessions per day.      Use CPM for 3-4  weeks or until you are told to stop.   Call MD / Call 911   Complete by: As directed    If you experience chest pain or shortness of breath, CALL 911 and be transported to the hospital emergency room.  If you develope a fever above 101 F, pus (white drainage) or increased drainage or redness at the wound, or calf pain, call your surgeon's office.   Change dressing   Complete by: As directed    DO NOT CHANGE YOUR DRESSING   Constipation Prevention   Complete by: As directed    Drink plenty of fluids.  Prune juice may be helpful.  You may use a stool softener, such as Colace (over the counter) 100 mg twice a day.  Use MiraLax (over the counter) for constipation as needed.   Diet Carb Modified   Complete by: As directed    Discharge instructions   Complete by: As directed    INSTRUCTIONS AFTER JOINT REPLACEMENT   Remove items at home which could result in a fall. This includes throw rugs or furniture in walking pathways ICE to the affected joint every three hours while awake for 30 minutes at a time, for at least the first 3-5 days, and  then as needed for pain and swelling.  Continue to use ice for pain and swelling. You may notice swelling that will progress down to the foot and ankle.  This is normal after  surgery.  Elevate your leg when you are not up walking on it.   Continue to use the breathing machine you got in the hospital (incentive spirometer) which will help keep your temperature down.  It is common for your temperature to cycle up and down following surgery, especially at night when you are not up moving around and exerting yourself.  The breathing machine keeps your lungs expanded and your temperature down.   DIET:  As you were doing prior to hospitalization, we recommend a well-balanced diet.  DRESSING / WOUND CARE / SHOWERING  Keep the surgical dressing until follow up.  The dressing is water proof, so you can shower without any extra covering.  IF THE DRESSING FALLS OFF or the wound gets wet inside, change the dressing with sterile gauze.  Please use good hand washing techniques before changing the dressing.  Do not use any lotions or creams on the incision until instructed by your surgeon.    ACTIVITY  Increase activity slowly as tolerated, but follow the weight bearing instructions below.   No driving for 6 weeks or until further direction given by your physician.  You cannot drive while taking narcotics.  No lifting or carrying greater than 10 lbs. until further directed by your surgeon. Avoid periods of inactivity such as sitting longer than an hour when not asleep. This helps prevent blood clots.  You may return to work once you are authorized by your doctor.     WEIGHT BEARING   Partial weight bearing with assist device as directed.  50%   EXERCISES  Results after joint replacement surgery are often greatly improved when you follow the exercise, range of motion and muscle strengthening exercises prescribed by your doctor. Safety measures are also important to protect the joint from further injury.  Any time any of these exercises cause you to have increased pain or swelling, decrease what you are doing until you are comfortable again and then slowly increase them. If you have problems or questions, call your caregiver or physical therapist for advice.   Rehabilitation is important following a joint replacement. After just a few days of immobilization, the muscles of the leg can become weakened and shrink (atrophy).  These exercises are designed to build up the tone and strength of the thigh and leg muscles and to improve motion. Often times heat used for twenty to thirty minutes before working out will loosen up your tissues and help with improving the range of motion but do not use heat for the first two weeks following surgery (sometimes heat can increase post-operative swelling).   These exercises can be done on a training (exercise) mat, on the floor, on a table or on a bed. Use whatever works the best and is most comfortable for you.    Use music or television while you are exercising so that the exercises are a pleasant break in your day. This will make your life better with the exercises acting as a break in your routine that you can look forward to.   Perform all exercises about fifteen times, three times per day or as directed.  You should exercise both the operative leg and the other leg as well.   Exercises include:  Quad Sets - Tighten up the muscle on the front of the thigh (Quad) and hold for 5-10 seconds.   Straight Leg Raises - With your knee straight (if you were given a brace, keep it on), lift the leg to 60 degrees,  hold for 3 seconds, and slowly lower the leg.  Perform this exercise against resistance later as your leg gets stronger.  Leg Slides: Lying on your back, slowly slide your foot toward your buttocks, bending your knee up off the floor (only go as far as is comfortable). Then slowly slide your foot back down until your leg is flat on the floor again.  Angel Wings: Lying  on your back spread your legs to the side as far apart as you can without causing discomfort.  Hamstring Strength:  Lying on your back, push your heel against the floor with your leg straight by tightening up the muscles of your buttocks.  Repeat, but this time bend your knee to a comfortable angle, and push your heel against the floor.  You may put a pillow under the heel to make it more comfortable if necessary.   A rehabilitation program following joint replacement surgery can speed recovery and prevent re-injury in the future due to weakened muscles. Contact your doctor or a physical therapist for more information on knee rehabilitation.    CONSTIPATION  Constipation is defined medically as fewer than three stools per week and severe constipation as less than one stool per week.  Even if you have a regular bowel pattern at home, your normal regimen is likely to be disrupted due to multiple reasons following surgery.  Combination of anesthesia, postoperative narcotics, change in appetite and fluid intake all can affect your bowels.   YOU MUST use at least one of the following options; they are listed in order of increasing strength to get the job done.  They are all available over the counter, and you may need to use some, POSSIBLY even all of these options:    Drink plenty of fluids (prune juice may be helpful) and high fiber foods Colace 100 mg by mouth twice a day  Senokot for constipation as directed and as needed Dulcolax (bisacodyl), take with full glass of water  Miralax (polyethylene glycol) once or twice a day as needed.  If you have tried all these things and are unable to have a bowel movement in the first 3-4 days after surgery call either your surgeon or your primary doctor.    If you experience loose stools or diarrhea, hold the medications until you stool forms back up.  If your symptoms do not get better within 1 week or if they get worse, check with your doctor.  If you  experience "the worst abdominal pain ever" or develop nausea or vomiting, please contact the office immediately for further recommendations for treatment.   ITCHING:  If you experience itching with your medications, try taking only a single pain pill, or even half a pain pill at a time.  You can also use Benadryl over the counter for itching or also to help with sleep.   TED HOSE STOCKINGS:  Use stockings on both legs until for at least 2 weeks or as directed by physician office. They may be removed at night for sleeping.  MEDICATIONS:  See your medication summary on the "After Visit Summary" that nursing will review with you.  You may have some home medications which will be placed on hold until you complete the course of blood thinner medication.  It is important for you to complete the blood thinner medication as prescribed.  PRECAUTIONS:  If you experience chest pain or shortness of breath - call 911 immediately for transfer to the hospital emergency department.   If  you develop a fever greater that 101 F, purulent drainage from wound, increased redness or drainage from wound, foul odor from the wound/dressing, or calf pain - CONTACT YOUR SURGEON.                                                   FOLLOW-UP APPOINTMENTS:  If you do not already have a post-op appointment, please call the office for an appointment to be seen by your surgeon.  Guidelines for how soon to be seen are listed in your "After Visit Summary", but are typically between 1-4 weeks after surgery.  OTHER INSTRUCTIONS:   Knee Replacement:  Do not place pillow under knee, focus on keeping the knee straight while resting. CPM instructions: 0-90 degrees, 2 hours in the morning, 2 hours in the afternoon, and 2 hours in the evening. Place foam block, curve side up under heel at all times except when in CPM or when walking.  DO NOT modify, tear, cut, or change the foam block in any way.  MAKE SURE YOU:  Understand these  instructions.  Get help right away if you are not doing well or get worse.    Thank you for letting us be a part of your medical care team.  It is a privilege we respect greatly.  We hope these instructions will help you stay on track for a fast and full recovery!   Do not put a pillow under the knee. Place it under the heel.   Complete by: As directed    Driving restrictions   Complete by: As directed    No driving for 6 weeks   Increase activity slowly as tolerated   Complete by: As directed    Lifting restrictions   Complete by: As directed    No lifting for 6 weeks   Partial weight bearing   Complete by: As directed    % Body Weight: 50%   Laterality: right   Extremity: Lower   Patient may shower   Complete by: As directed    You may shower over the brown dressing   TED hose   Complete by: As directed    Use stockings (TED hose) for 2-3 weeks on right leg.  You may remove them at night for sleeping.      DISCHARGE MEDICATIONS:   Allergies as of 10/13/2018   No Known Allergies     Medication List    STOP taking these medications   meloxicam 15 MG tablet Commonly known as: MOBIC     TAKE these medications   aspirin 81 MG chewable tablet Chew 1 tablet (81 mg total) by mouth 2 (two) times daily.   atorvastatin 80 MG tablet Commonly known as: LIPITOR Take 40 mg by mouth daily.   b complex vitamins tablet Take 1 tablet by mouth daily.   Carbidopa-Levodopa ER 48.75-195 MG Cpcr Take 1 tablet by mouth 4 (four) times daily.   hydrOXYzine 25 MG tablet Commonly known as: ATARAX/VISTARIL Take 25 mg by mouth 4 (four) times daily as needed for anxiety.   lisinopril-hydrochlorothiazide 20-25 MG tablet Commonly known as: ZESTORETIC Take 1 tablet by mouth daily.   loratadine 10 MG tablet Commonly known as: CLARITIN Take 10 mg by mouth daily.   metFORMIN 500 MG tablet Commonly known as: GLUCOPHAGE Take 500 mg by mouth 2 (two)  times daily with a meal.    methocarbamol 500 MG tablet Commonly known as: ROBAXIN Take 1 tablet (500 mg total) by mouth every 8 (eight) hours as needed for muscle spasms.   MULTIVITAMIN PO Take 1 tablet by mouth daily.   oxyCODONE 5 MG immediate release tablet Commonly known as: Oxy IR/ROXICODONE Take 1-2 tablets (5-10 mg total) by mouth every 6 (six) hours as needed for moderate pain or severe pain.   pantoprazole 40 MG tablet Commonly known as: PROTONIX Take 40 mg by mouth daily.   phenytoin 100 MG ER capsule Commonly known as: DILANTIN Take 100 mg by mouth 3 (three) times daily.   Refresh 1.4-0.6 % Soln Generic drug: Polyvinyl Alcohol-Povidone PF Place 1 drop into both eyes 3 (three) times daily.   rOPINIRole 1 MG tablet Commonly known as: REQUIP Take 1 mg by mouth every evening.   VITAMIN D3 PO Take 1 capsule by mouth daily.            Durable Medical Equipment  (From admission, onward)         Start     Ordered   10/12/18 1159  DME Walker rolling  Once    Question:  Patient needs a walker to treat with the following condition  Answer:  Total knee replacement status, right   10/12/18 1158   10/12/18 1159  DME 3 n 1  Once     10/12/18 1158   10/12/18 1159  DME Bedside commode  Once    Question:  Patient needs a bedside commode to treat with the following condition  Answer:  Total knee replacement status, right   10/12/18 1158           Discharge Care Instructions  (From admission, onward)         Start     Ordered   10/13/18 0000  Partial weight bearing    Question Answer Comment  % Body Weight 50%   Laterality right   Extremity Lower      10/13/18 1148   10/13/18 0000  Change dressing    Comments: DO NOT CHANGE YOUR DRESSING   10/13/18 1148          FOLLOW UP VISIT:  In 2 weeks in [facility]  DISPOSITION:   Home  CONDITION:  Stable   Mike Craze. [place], [place] [phone]  10/13/2018 11:54 AM

## 2018-10-13 NOTE — Telephone Encounter (Signed)
Diane, pharmacist from [facility] called stating they need a return call before they will be able to fill patient's prescription of Oxycodone 5 mg.  Diane states she wanted to make  Dr. Durward Fortes aware that a doctor in [place] prescribed Percacet for patient (42 tablets) on 10/08/18.  Please return call at [phone] opt [phone]

## 2018-10-13 NOTE — Progress Notes (Signed)
Spoke with Dr Guerry Minors about [name]'s narcotic use from his back surgeons and we will provide an Rx for 27 Percocet only and then we will need to f/u with his back surgeons for narcotic coverage  Danny Hampton. Danny Hampton [phone]  10/13/2018 11:53 AM

## 2018-10-13 NOTE — Telephone Encounter (Signed)
called

## 2018-10-13 NOTE — Progress Notes (Signed)
Patient cleared to discharge with PT. Pt given discharge instructions and educated about medications, activity, and equipment. Pt discharging home with girlfriend as ride.

## 2018-10-13 NOTE — Op Note (Signed)
PATIENT ID: Danny Hampton        MRN:  [id number]          DOB/AGE: 04-27-1955 / 63 y.o.    Joni Fears, MD   Biagio Borg, PA-C [address] [place], [place]  [address]                             [phone]   PROGRESS NOTE  Subjective:  negative for Chest Pain  negative for Shortness of Breath  negative for Nausea/Vomiting   negative for Calf Pain    Tolerating Diet: yes         Patient reports pain as mild and moderate.     Comfortable-adductor canal block still effective  Objective: Vital signs in last 24 hours:    Patient Vitals for the past 24 hrs:  BP Temp Temp src Pulse Resp SpO2  10/13/18 0454 138/88 98.2 F (36.8 C) - 82 20 100 %  10/13/18 0031 117/80 98.4 F (36.9 C) Oral 85 18 100 %  10/12/18 2016 114/73 98.7 F (37.1 C) Oral 80 20 99 %  10/12/18 1706 107/62 98.9 F (37.2 C) Oral 85 16 99 %  10/12/18 1504 115/71 98.2 F (36.8 C) Oral 80 16 95 %  10/12/18 1417 119/90 98.1 F (36.7 C) Oral 91 17 95 %  10/12/18 1301 124/77 97.6 F (36.4 C) Oral 72 16 97 %  10/12/18 1153 117/77 97.8 F (36.6 C) Oral 73 14 98 %  10/12/18 1115 128/79 - - 85 - 99 %  10/12/18 1100 115/75 98.9 F (37.2 C) - 73 11 97 %  10/12/18 1045 108/75 - - 79 18 98 %  10/12/18 1030 100/84 - - 89 14 97 %  10/12/18 1015 119/83 - - 84 13 99 %  10/12/18 1000 (!) 120/91 - - 91 11 100 %  10/12/18 0952 118/86 99.3 F (37.4 C) - 93 10 99 %      Intake/Output from previous day:   08/04 0701 - 08/05 0700 In: 3090.6 [I.V.:2978.9] Out: 2350 [Urine:2250]   Intake/Output this shift:   No intake/output data recorded.   Intake/Output      08/04 0701 - 08/05 0700 08/05 0701 - 08/06 0700   I.V. (mL/kg) 2978.9 (35.9)    IV Piggyback 111.7    Total Intake(mL/kg) 3090.6 (37.2)    Urine (mL/kg/hr) 2250 (1.1)    Blood 100    Total Output 2350    Net +740.6            LABORATORY DATA: Recent Labs    10/07/18 0923 10/13/18 0452  WBC 7.7 6.5  HGB 12.6* 9.2*  HCT 38.8*  28.8*  PLT 216 153   Recent Labs    10/07/18 0923 10/13/18 0452  NA 139 137  K 4.4 4.5  CL 101 103  CO2 26 29  BUN 14 10  CREATININE 0.62 0.64  GLUCOSE 118* 130*  CALCIUM 9.9 8.2*   Lab Results  Component Value Date   INR 0.9 10/07/2018    Recent Radiographic Studies :  Dg Chest 2 View  Result Date: 10/07/2018 CLINICAL DATA:  Preop total knee replacement. EXAM: CHEST - 2 VIEW COMPARISON:  Chest x-ray dated 07/04/2015. FINDINGS: Heart size and mediastinal contours are within normal limits. Lungs are clear. No pleural effusion. Osseous structures about the chest are unremarkable. IMPRESSION: No active cardiopulmonary disease. Electronically Signed   By: Roxy Horseman.D.  On: 10/07/2018 15:38     Examination:  General appearance: alert, cooperative and no distress  Wound Exam: clean, dry, intact   Drainage:  None: wound tissue dry  Motor Exam: EHL, FHL, Anterior Tibial and Posterior Tibial Intact  Sensory Exam: Superficial Peroneal, Deep Peroneal and Tibial normal  Vascular Exam: Normal  Assessment:    1 Day Post-Op  Procedure(s) (LRB): RIGHT TOTAL KNEE ARTHROPLASTY (Right)  ADDITIONAL DIAGNOSIS:  Active Problems:   Total knee replacement status, right  Acute Blood Loss Anemia-asymptomatic   Plan: Physical Therapy as ordered Partial Weight Bearing @ 50% (PWB)  DVT Prophylaxis:  Aspirin and TED hose  DISCHARGE PLAN: Home  DISCHARGE NEEDS: HHPT, CPM, Walker and 3-in-1 comode seat   Patient's anticipated LOS is less than 2 midnights, meeting these requirements: - Younger than 28 - Lives within 1 hour of care - Has a competent adult at home to recover with post-op recover - NO history of  - Chronic pain requiring opiods  - Diabetes  - Coronary Artery Disease  - Heart failure  - Heart attack  - Stroke  - DVT/VTE  - Cardiac arrhythmia  - Respiratory Failure/COPD  - Renal failure  - Anemia  - Advanced Liver disease Dressing changed-right knee  wound clean and dry. Had ilieus with recent lumbar surgery but no symptoms today-BS active. Voiding without difficulty. Will plan on discharge today if does well in PT. Office as scheduled in 2 weeks          Biagio Borg, Hershal Coria [place]  10/13/2018 7:24 AM

## 2018-10-13 NOTE — Telephone Encounter (Signed)
Please see below.

## 2018-10-15 ENCOUNTER — Telehealth: Payer: Self-pay | Admitting: Orthopaedic Surgery

## 2018-10-15 NOTE — Telephone Encounter (Signed)
Erin with [facility] has called the [place] location in regards to this patient's CPM machine that has been ordered. Will you please call? Junie Panning ph# [phone]

## 2018-10-18 ENCOUNTER — Telehealth: Payer: Self-pay | Admitting: Orthopaedic Surgery

## 2018-10-18 ENCOUNTER — Other Ambulatory Visit: Payer: Self-pay | Admitting: Orthopaedic Surgery

## 2018-10-18 MED ORDER — OXYCODONE-ACETAMINOPHEN 5-325 MG PO TABS: 1.0000 | ORAL_TABLET | ORAL | 0 refills | Status: DC | PRN

## 2018-10-18 NOTE — Telephone Encounter (Signed)
Patient left a voicemail requesting a return call regarding his prescription refill.

## 2018-10-18 NOTE — Telephone Encounter (Signed)
Spoke with patient. He is wanting a refill of the oxycodone sent to the [facility] in [place]. He has concerns about his refill being adjusted to taking it more than 6 hours apart. He said he initially was taking oxycodone every 4 hours, and this barely touched his pain. Then his last refill was every 6 hours, and that has made it even harder to control his pain.  Please advise

## 2018-10-18 NOTE — Telephone Encounter (Signed)
Tried to call patient. Got disconnected twice. I was calling to see what patient needed.

## 2018-10-18 NOTE — Telephone Encounter (Signed)
Patient notified

## 2018-10-18 NOTE — Telephone Encounter (Signed)
Rx sent to [facility] in [place]-might be an issue with filling related to his multiple prior prescriptions

## 2018-10-19 ENCOUNTER — Telehealth: Payer: Self-pay | Admitting: Orthopaedic Surgery

## 2018-10-19 NOTE — Telephone Encounter (Signed)
Please advise 

## 2018-10-19 NOTE — Telephone Encounter (Signed)
Debbie, physical therapist at [facility] left a voicemail requesting a return call to confirm patient is 50% partial weight bearing.  Phone [phone]

## 2018-10-19 NOTE — Telephone Encounter (Signed)
Yes-50% WB for first two weeks

## 2018-10-19 NOTE — Telephone Encounter (Signed)
Called and left message for Debbie regarding information below.

## 2018-10-21 ENCOUNTER — Other Ambulatory Visit: Payer: Self-pay | Admitting: Orthopedic Surgery

## 2018-10-21 ENCOUNTER — Other Ambulatory Visit: Payer: Self-pay | Admitting: Orthopaedic Surgery

## 2018-10-21 ENCOUNTER — Telehealth: Payer: Self-pay | Admitting: Orthopaedic Surgery

## 2018-10-21 MED ORDER — METHOCARBAMOL 500 MG PO TABS: 500.0000 mg | ORAL_TABLET | Freq: Two times a day (BID) | ORAL | 0 refills | Status: DC | PRN

## 2018-10-21 MED ORDER — OXYCODONE-ACETAMINOPHEN 5-325 MG PO TABS: 1.0000 | ORAL_TABLET | Freq: Four times a day (QID) | ORAL | 0 refills | Status: DC | PRN

## 2018-10-21 NOTE — Telephone Encounter (Signed)
Please advise 

## 2018-10-21 NOTE — Telephone Encounter (Signed)
Long discussion with pt re use of meds-needs to try ice,robaxin and less oxy-will send in new rx for percocet

## 2018-10-21 NOTE — Telephone Encounter (Signed)
Patient request a refill on Methocarbamol due now, and refill Oxycodone due on Saturday sent to [facility] in [place].

## 2018-10-21 NOTE — Telephone Encounter (Signed)
Patient calling in reference to outpatient physical therapy.  He wants to make sure this is lined up and authorized through the [place] by the time he ends his in-home physical therapy.  I spoke with the physical therapist Jackelyn Poling with [facility] today.  Apparently the case worker at the hospital thought the patient LIVED in [place]. Instead of using Sonia Side with [facility],  patient was assigned to [facility].  Patient lives in [place], but was assigned to the [facility] in [place] because it's closer to his home than [place] .  I also spoke with Sonia Side about this today because for all of Dr. Rudene Anda totals, a demographic sheet is printed out and put in a folder for Sonia Side to pick up. CPM requests are sent to [facility]. Normally, Sonia Side stated he knows the protocol for Dr. Rudene Anda patient's and would not have to ask about weight bearing. Patient would like to continue the out patient therapy in [place].  According to the patient physical therapy is authorized, but not to exceed 15 visits.  [facility] will need to be notified to request additional visits and Rx for the therapy sent to patient's [facility].  Patient's PCP through the [place] is Cynda Familia and her contact number is [phone].

## 2018-10-21 NOTE — Telephone Encounter (Signed)
PLEASE CALL HIM CAN'T ORDER NARCOTICS IN ADVANCE METHCARBAMOL HAS BEEN SENT

## 2018-10-21 NOTE — Telephone Encounter (Signed)
Can you please send in script for oxycodone to pharmacy and have them hold the refill until Saturday?

## 2018-10-22 ENCOUNTER — Other Ambulatory Visit: Payer: Self-pay | Admitting: Orthopaedic Surgery

## 2018-10-22 DIAGNOSIS — Z96651 Presence of right artificial knee joint: Secondary | ICD-10-CM

## 2018-10-22 NOTE — Telephone Encounter (Signed)
Please see below.

## 2018-10-22 NOTE — Telephone Encounter (Signed)
PT referral and order has been faxed to [facility] in [place], per patient's request.\ [facility] in [place] Fax# [phone]

## 2018-10-22 NOTE — Telephone Encounter (Signed)
Ok to authorize further PT as asked if [facility] will approve

## 2018-10-27 ENCOUNTER — Ambulatory Visit (INDEPENDENT_AMBULATORY_CARE_PROVIDER_SITE_OTHER): Payer: No Typology Code available for payment source

## 2018-10-27 ENCOUNTER — Ambulatory Visit (INDEPENDENT_AMBULATORY_CARE_PROVIDER_SITE_OTHER): Payer: No Typology Code available for payment source | Admitting: Orthopaedic Surgery

## 2018-10-27 ENCOUNTER — Encounter: Payer: Self-pay | Admitting: Orthopaedic Surgery

## 2018-10-27 ENCOUNTER — Other Ambulatory Visit: Payer: Self-pay

## 2018-10-27 VITALS — Ht 67.0 in | Wt 183.0 lb

## 2018-10-27 DIAGNOSIS — M1711 Unilateral primary osteoarthritis, right knee: Secondary | ICD-10-CM

## 2018-10-27 DIAGNOSIS — Z96651 Presence of right artificial knee joint: Secondary | ICD-10-CM

## 2018-10-27 NOTE — Progress Notes (Signed)
Office Visit Note   Patient: Danny Hampton           Date of Birth: 02-23-1956           MRN: [id number] Visit Date: 10/27/2018              Requested by: No referring provider defined for this encounter. PCP: System, Pcp Not In   Assessment & Plan: Visit Diagnoses:  1. Status post total right knee replacement   2. Unilateral primary osteoarthritis, right knee   3. History of total right knee replacement     Plan: 2 weeks status post primary right total knee replacement doing well.  Has 100 degrees of flexion and just about full extension.  Using a cane for ambulation.  Long discussion regarding use of the oxycodone.  Had taken over 260 pills in the month of July prior to his surgery through his spine physician.  He said 100 pills prescribed since he has had surgery.  He needs to cut back on his medicine and use ice anti-inflammatory medicines.  I will cover him with his medicines for the next 3 weeks and then have him follow-up with a spine physician for further prescriptions.  Continue with his home exercises and therapy and return in 3 weeks  Follow-Up Instructions: Return in about 3 weeks (around 11/17/2018).   Orders:  Orders Placed This Encounter  Procedures  . XR KNEE 3 VIEW RIGHT   No orders of the defined types were placed in this encounter.     Procedures: No procedures performed   Clinical Data: No additional findings.   Subjective: Chief Complaint  Patient presents with  . Right Knee - Routine Post Op    Right TKA DOS 10/12/18  Patient presents today for a follow up on his right knee. He had a right total knee arthroplasty on 10/12/2018. He is now two weeks post op. He is doing therapy. Patient states that he is doing okay. He is taking pain medicine as prescribed. . Trying to cut back on his pain medicines.  No related fever or chills  HPI  Review of Systems   Objective: Vital Signs: Ht 5\' 7"  (1.702 m)   Wt 183 lb (83 kg)   BMI 28.66 kg/m    Physical Exam  Ortho Exam right knee wound is healing without problem.  Clips removed and Steri-Strips applied.  No calf pain.  Neurologically intact.  Moderate edema about the right knee as expected.  Very minimal effusion.  Full extension about 100 degrees of flexion.  Using a cane for ambulation  Specialty Comments:  No specialty comments available.  Imaging: Xr Knee 3 View Right  Result Date: 10/27/2018 Films of the right total knee replacement performed in 3 projections.  Excellent position of the components with nice alignment.  No obvious complications.  Patella appears to track in the midline    PMFS History: Patient Active Problem List   Diagnosis Date Noted  . History of total right knee replacement 10/12/2018  . Unilateral primary osteoarthritis, right knee 09/30/2018  . Cervicalgia 04/21/2018  . Bilateral primary osteoarthritis of knee 01/03/2016   Past Medical History:  Diagnosis Date  . Acid reflux   . Anxiety   . Arthritis   . Cancer ([place])    skin cancer  . Complication of anesthesia   . Diabetes mellitus without complication ([place])   . Family history of adverse reaction to anesthesia   . Hypertension   . Myotonia congenita  History reviewed. No pertinent family history.  Past Surgical History:  Procedure Laterality Date  . BACK SURGERY     times 2  . CARPAL TUNNEL RELEASE    . NOSE SURGERY    . TOTAL KNEE ARTHROPLASTY Right 10/12/2018   Procedure: RIGHT TOTAL KNEE ARTHROPLASTY;  Surgeon: Garald Balding, MD;  Location: [facility];  Service: Orthopedics;  Laterality: Right;   Social History   Occupational History  . Occupation: retired Clinical biochemist  Tobacco Use  . Smoking status: Former Smoker    Packs/day: 0.50    Years: 30.00    Pack years: 15.00    Types: Cigarettes    Quit date: 03/2018    Years since quitting: 0.6  . Smokeless tobacco: Never Used  Substance and Sexual Activity  . Alcohol use: Yes    Comment: occasional drink  . Drug use:  No  . Sexual activity: Not on file

## 2018-11-01 ENCOUNTER — Other Ambulatory Visit: Payer: Self-pay | Admitting: Orthopaedic Surgery

## 2018-11-01 ENCOUNTER — Telehealth: Payer: Self-pay | Admitting: Orthopaedic Surgery

## 2018-11-01 MED ORDER — METHOCARBAMOL 500 MG PO TABS: 500.0000 mg | ORAL_TABLET | Freq: Three times a day (TID) | ORAL | 0 refills | Status: DC | PRN

## 2018-11-01 MED ORDER — OXYCODONE-ACETAMINOPHEN 5-325 MG PO TABS: 1.0000 | ORAL_TABLET | Freq: Three times a day (TID) | ORAL | 0 refills | Status: DC | PRN

## 2018-11-01 NOTE — Telephone Encounter (Signed)
Called patient and notified him that scripts have been sent in.

## 2018-11-01 NOTE — Telephone Encounter (Signed)
Please advise when it has been sent

## 2018-11-01 NOTE — Telephone Encounter (Signed)
Patient called requesting prescription refill of Oxycodone and Methocarbamol to be sent to [facility] at [address] in [place].  Patient states he is out of medication and was told at his last appointment on 10/27/18 that Dr. Durward Fortes would send the prescription in today 11/01/18.  Patient is requesting a return call to let him know it has been sent.

## 2018-11-01 NOTE — Telephone Encounter (Signed)
sent 

## 2018-11-10 ENCOUNTER — Telehealth: Payer: Self-pay | Admitting: Orthopaedic Surgery

## 2018-11-10 NOTE — Telephone Encounter (Signed)
Patient requesting refill on Oxycodone with Tylenol sent to [facility] [address] in [place].

## 2018-11-10 NOTE — Telephone Encounter (Signed)
Please advise 

## 2018-11-11 ENCOUNTER — Other Ambulatory Visit: Payer: Self-pay | Admitting: Orthopaedic Surgery

## 2018-11-11 MED ORDER — OXYCODONE-ACETAMINOPHEN 5-325 MG PO TABS: 1.0000 | ORAL_TABLET | Freq: Three times a day (TID) | ORAL | 0 refills | Status: DC | PRN

## 2018-11-11 NOTE — Telephone Encounter (Signed)
sent 

## 2018-11-11 NOTE — Telephone Encounter (Signed)
Called and left message for patient advising that medication has been sent to pharmacy.

## 2018-11-18 ENCOUNTER — Ambulatory Visit: Payer: No Typology Code available for payment source | Admitting: Orthopaedic Surgery

## 2018-11-18 ENCOUNTER — Other Ambulatory Visit: Payer: Self-pay

## 2018-11-18 ENCOUNTER — Encounter: Payer: Self-pay | Admitting: Orthopaedic Surgery

## 2018-11-18 ENCOUNTER — Ambulatory Visit (INDEPENDENT_AMBULATORY_CARE_PROVIDER_SITE_OTHER): Payer: No Typology Code available for payment source | Admitting: Orthopaedic Surgery

## 2018-11-18 VITALS — BP 132/95 | HR 81 | Ht 67.0 in | Wt 183.0 lb

## 2018-11-18 DIAGNOSIS — Z96651 Presence of right artificial knee joint: Secondary | ICD-10-CM

## 2018-11-18 NOTE — Progress Notes (Signed)
Office Visit Note   Patient: Danny Hampton           Date of Birth: 12-11-1955           MRN: [id number] Visit Date: 11/18/2018              Requested by: No referring provider defined for this encounter. PCP: System, Pcp Not In   Assessment & Plan: Visit Diagnoses:  1. Total knee replacement status, right     Plan:  #1: Continue his home exercise program. #2: Increase activity as tolerated  Follow-Up Instructions: Return in about 3 months (around 02/17/2019).   Orders:  No orders of the defined types were placed in this encounter.  No orders of the defined types were placed in this encounter.     Procedures: No procedures performed   Clinical Data: No additional findings.   Subjective: Chief Complaint  Patient presents with  . Right Knee - Routine Post Op    Right TKA DOS 10/12/2018     HPI  Limited range of motionPatient presents today for follow up on his right knee pain. He is now 5 weeks out from right total knee arthroplasty. His surgery was 10/12/2018. He is taking his pain medicine as needed. He is also taking naprosyn as needed. He finished therapy toda   Review of Systems  Constitutional: Negative for fatigue.  HENT: Negative for ear pain.   Eyes: Negative for pain.  Respiratory: Negative for shortness of breath.   Cardiovascular: Negative for leg swelling.  Gastrointestinal: Negative for constipation and diarrhea.  Endocrine: Negative for cold intolerance and heat intolerance.  Genitourinary: Negative for difficulty urinating.  Musculoskeletal: Negative for joint swelling.  Skin: Negative for rash.  Allergic/Immunologic: Negative for food allergies.  Neurological: Negative for weakness.  Hematological: Does not bruise/bleed easily.  Psychiatric/Behavioral: Positive for sleep disturbance.     Objective: Vital Signs: BP (!) 132/95   Pulse 81   Ht 5\' 7"  (1.702 m)   Wt 183 lb (83 kg)   BMI 28.66 kg/m   Physical Exam  Constitutional:      Appearance: He is well-developed.  Eyes:     Pupils: Pupils are equal, round, and reactive to light.  Pulmonary:     Effort: Pulmonary effort is normal.  Skin:    General: Skin is warm and dry.  Neurological:     Mental Status: He is alert and oriented to person, place, and time.  Psychiatric:        Behavior: Behavior normal.     Ortho Exam  Exam today reveals the knee incision to be healing per primam with no signs of infection.  He has range of motion from near full extension to 110 degrees easily today here in the office.  Good ligament stability.  Neurovascular intact distally.  Calf is supple and no tenderness to palpation.  Specialty Comments:  No specialty comments available.  Imaging: No results found.   PMFS History: Current Outpatient Medications  Medication Sig Dispense Refill  . aspirin 81 MG chewable tablet Chew 1 tablet (81 mg total) by mouth 2 (two) times daily.    [name] atorvastatin (LIPITOR) 80 MG tablet Take 40 mg by mouth daily.     [name] b complex vitamins tablet Take 1 tablet by mouth daily.    . Carbidopa-Levodopa ER 48.75-195 MG CPCR Take 1 tablet by mouth 4 (four) times daily.    . Cholecalciferol (VITAMIN D3 PO) Take 1 capsule by mouth daily.    [name]  hydrOXYzine (ATARAX/VISTARIL) 25 MG tablet Take 25 mg by mouth 4 (four) times daily as needed for anxiety.   0  . lisinopril-hydrochlorothiazide (ZESTORETIC) 20-25 MG tablet Take 1 tablet by mouth daily.    [name] loratadine (CLARITIN) 10 MG tablet Take 10 mg by mouth daily.    . metFORMIN (GLUCOPHAGE) 500 MG tablet Take 500 mg by mouth 2 (two) times daily with a meal.     . methocarbamol (ROBAXIN) 500 MG tablet Take 1 tablet (500 mg total) by mouth every 12 (twelve) hours as needed for muscle spasms. 20 tablet 0  . methocarbamol (ROBAXIN) 500 MG tablet Take 1 tablet (500 mg total) by mouth every 8 (eight) hours as needed for muscle spasms. 30 tablet 0  . Multiple Vitamin (MULTIVITAMIN PO) Take 1  tablet by mouth daily.    [name] oxyCODONE (OXY IR/ROXICODONE) 5 MG immediate release tablet Take 1-2 tablets (5-10 mg total) by mouth every 6 (six) hours as needed for moderate pain or severe pain. 40 tablet 0  . oxyCODONE-acetaminophen (PERCOCET/ROXICET) 5-325 MG tablet Take 1 tablet by mouth every 4 (four) hours as needed for severe pain. 30 tablet 0  . oxyCODONE-acetaminophen (PERCOCET/ROXICET) 5-325 MG tablet Take 1 tablet by mouth every 6 (six) hours as needed for severe pain. 30 tablet 0  . oxyCODONE-acetaminophen (PERCOCET/ROXICET) 5-325 MG tablet Take 1 tablet by mouth every 8 (eight) hours as needed for severe pain. 30 tablet 0  . oxyCODONE-acetaminophen (PERCOCET/ROXICET) 5-325 MG tablet Take 1 tablet by mouth every 8 (eight) hours as needed for severe pain. 30 tablet 0  . pantoprazole (PROTONIX) 40 MG tablet Take 40 mg by mouth daily.    . phenytoin (DILANTIN) 100 MG ER capsule Take 100 mg by mouth 3 (three) times daily.     . Polyvinyl Alcohol-Povidone PF (REFRESH) 1.4-0.6 % SOLN Place 1 drop into both eyes 3 (three) times daily.    [name] rOPINIRole (REQUIP) 1 MG tablet Take 1 mg by mouth every evening.      No current facility-administered medications for this visit.     Patient Active Problem List   Diagnosis Date Noted  . Total knee replacement status, right 10/12/2018  . Unilateral primary osteoarthritis, right knee 09/30/2018  . Cervicalgia 04/21/2018  . Bilateral primary osteoarthritis of knee 01/03/2016   Past Medical History:  Diagnosis Date  . Acid reflux   . Anxiety   . Arthritis   . Cancer ([place])    skin cancer  . Complication of anesthesia   . Diabetes mellitus without complication ([place])   . Family history of adverse reaction to anesthesia   . Hypertension   . Myotonia congenita     History reviewed. No pertinent family history.  Past Surgical History:  Procedure Laterality Date  . BACK SURGERY     times 2  . CARPAL TUNNEL RELEASE    . NOSE SURGERY    . TOTAL  KNEE ARTHROPLASTY Right 10/12/2018   Procedure: RIGHT TOTAL KNEE ARTHROPLASTY;  Surgeon: Garald Balding, MD;  Location: [facility];  Service: Orthopedics;  Laterality: Right;   Social History   Occupational History  . Occupation: retired Clinical biochemist  Tobacco Use  . Smoking status: Former Smoker    Packs/day: 0.50    Years: 30.00    Pack years: 15.00    Types: Cigarettes    Quit date: 03/2018    Years since quitting: 0.6  . Smokeless tobacco: Never Used  Substance and Sexual Activity  . Alcohol use: Yes  Comment: occasional drink  . Drug use: No  . Sexual activity: Not on file

## 2018-12-16 ENCOUNTER — Ambulatory Visit (INDEPENDENT_AMBULATORY_CARE_PROVIDER_SITE_OTHER): Payer: No Typology Code available for payment source | Admitting: Cardiology

## 2018-12-16 ENCOUNTER — Encounter: Payer: Self-pay | Admitting: Cardiology

## 2018-12-16 ENCOUNTER — Other Ambulatory Visit: Payer: Self-pay

## 2018-12-16 ENCOUNTER — Telehealth: Payer: Self-pay | Admitting: Cardiology

## 2018-12-16 VITALS — BP 118/75 | HR 113 | Ht 67.0 in | Wt 177.4 lb

## 2018-12-16 DIAGNOSIS — I10 Essential (primary) hypertension: Secondary | ICD-10-CM

## 2018-12-16 DIAGNOSIS — R0602 Shortness of breath: Secondary | ICD-10-CM

## 2018-12-16 DIAGNOSIS — F17201 Nicotine dependence, unspecified, in remission: Secondary | ICD-10-CM

## 2018-12-16 DIAGNOSIS — R002 Palpitations: Secondary | ICD-10-CM

## 2018-12-16 DIAGNOSIS — E782 Mixed hyperlipidemia: Secondary | ICD-10-CM

## 2018-12-16 DIAGNOSIS — R9431 Abnormal electrocardiogram [ECG] [EKG]: Secondary | ICD-10-CM | POA: Diagnosis not present

## 2018-12-16 NOTE — Patient Instructions (Addendum)
Medication Instructions:   Your physician recommends that you continue on your current medications as directed. Please refer to the Current Medication list given to you today.  Labwork:  NONE  Testing/Procedures: Your physician has requested that you have an echocardiogram. Echocardiography is a painless test that uses sound waves to create images of your heart. It provides your doctor with information about the size and shape of your heart and how well your heart's chambers and valves are working. This procedure takes approximately one hour. There are no restrictions for this procedure. Your physician has recommended that you wear an event monitor for 7 days. Event monitors are medical devices that record the heart's electrical activity. Doctors most often [place] these monitors to diagnose arrhythmias. Arrhythmias are problems with the speed or rhythm of the heartbeat. The monitor is a small, portable device. You can wear one while you do your normal daily activities. This is usually used to diagnose what is causing palpitations/syncope (passing out). [employer] will contact about this monitor after its mailed to your home address.  Follow-Up:  Your physician recommends that you schedule a follow-up appointment in: 6 weeks.  Any Other Special Instructions Will Be Listed Below (If Applicable).  If you need a refill on your cardiac medications before your next appointment, please call your pharmacy.

## 2018-12-16 NOTE — Progress Notes (Signed)
Cardiology Office Note  Date: 12/16/2018   ID: Danny, Hampton 07-20-1955, MRN [id number]  PCP:  System, Pcp Not In  Consulting Cardiologist:  Rozann Lesches, MD Electrophysiologist:  None   Chief Complaint  Patient presents with  . Palpitations    History of Present Illness: Danny Hampton is a 63 y.o. male referred for cardiology consultation through the [facility], provider listed as Cynda Familia, NP.  Reason for consultation is hypertension.  Unfortunately, information provided does not indicate information regarding past medical history or blood pressure trend.  In talking with the patient today, it looks like he is here for completely different reasons.  He tells me that he underwent spine surgery in [place] back in June.  He was on a heart monitor for part of that time and was told that he had some type of "heart problem" referring to episodes when his heart would speed up and also an abnormal ECG.  It is not clear that he underwent further cardiac work-up at that point.  Later on he had a right total knee arthroplasty in August at [facility], reportedly no cardiac issues at that point.  He does state that he feels palpitations, rapid heart rate that occurs sporadically.  It is happening 2 or 3 times a week.  Not associated with chest pain or syncope, he does feel somewhat lightheaded and short of breath.  I personally reviewed his ECG today which shows a sinus tachycardia with diffuse nonspecific ST-T wave abnormalities.  Past Medical History:  Diagnosis Date  . Acid reflux   . Anxiety   . Arthritis   . Essential hypertension   . History of skin cancer   . Myotonia congenita   . Type 2 diabetes mellitus ([place])     Past Surgical History:  Procedure Laterality Date  . BACK SURGERY     times 2  . CARPAL TUNNEL RELEASE    . NOSE SURGERY    . TOTAL KNEE ARTHROPLASTY Right 10/12/2018   Procedure: RIGHT TOTAL KNEE ARTHROPLASTY;  Surgeon:  Garald Balding, MD;  [facility];  Service: Orthopedics;  Laterality: Right;    Current Outpatient Medications  Medication Sig Dispense Refill  . aspirin 81 MG chewable tablet Chew 1 tablet (81 mg total) by mouth 2 (two) times daily.    [name] atorvastatin (LIPITOR) 80 MG tablet Take 40 mg by mouth daily.     [name] b complex vitamins tablet Take 1 tablet by mouth daily.    . Carbidopa-Levodopa ER 48.75-195 MG CPCR Take 1 tablet by mouth 4 (four) times daily.    . Cholecalciferol (VITAMIN D3 PO) Take 1 capsule by mouth daily.    . hydrOXYzine (ATARAX/VISTARIL) 25 MG tablet Take 25 mg by mouth 4 (four) times daily as needed for anxiety.   0  . lisinopril-hydrochlorothiazide (ZESTORETIC) 20-25 MG tablet Take 1 tablet by mouth daily.    [name] loratadine (CLARITIN) 10 MG tablet Take 10 mg by mouth daily.    . meloxicam (MOBIC) 15 MG tablet Take 15 mg by mouth daily.    . metFORMIN (GLUCOPHAGE) 500 MG tablet Take 500 mg by mouth 2 (two) times daily with a meal.     . Multiple Vitamin (MULTIVITAMIN PO) Take 1 tablet by mouth daily.    . pantoprazole (PROTONIX) 40 MG tablet Take 40 mg by mouth daily.    . phenytoin (DILANTIN) 100 MG ER capsule Take 100 mg by mouth 3 (three) times  daily.     . Polyvinyl Alcohol-Povidone PF (REFRESH) 1.4-0.6 % SOLN Place 1 drop into both eyes 3 (three) times daily.    [name] rOPINIRole (REQUIP) 1 MG tablet Take 0.5 mg by mouth 2 (two) times daily.      No current facility-administered medications for this visit.    Allergies:  Patient has no known allergies.   Social History: The patient  reports that he quit smoking about 9 months ago. His smoking use included cigarettes. He has a 15.00 pack-year smoking history. He has never used smokeless tobacco. He reports current alcohol use. He reports that he does not use drugs.   Family History: The patient's family history is not on file.   ROS:  Please see the history of present illness. Otherwise, complete review of systems is  positive for none.  All other systems are reviewed and negative.   Physical Exam: VS:  BP 118/75   Pulse (!) 113   Ht 5\' 7"  (1.702 m)   Wt 177 lb 6.4 oz (80.5 kg)   SpO2 96%   BMI 27.78 kg/m , BMI Body mass index is 27.78 kg/m.  Wt Readings from Last 3 Encounters:  12/16/18 177 lb 6.4 oz (80.5 kg)  11/18/18 183 lb (83 kg)  10/27/18 183 lb (83 kg)    General: Patient appears comfortable at rest. HEENT: Conjunctiva and lids normal, wearing a mask. Neck: Supple, no elevated JVP or carotid bruits, no thyromegaly. Lungs: Clear to auscultation, nonlabored breathing at rest. Cardiac: Regular rate and rhythm, no S3, soft systolic murmur. Abdomen: Soft, nontender, bowel sounds present. Extremities: No pitting edema, distal pulses 2+. Skin: Warm and dry. Musculoskeletal: No kyphosis. Neuropsychiatric: Alert and oriented x3, affect grossly appropriate.  ECG:  An ECG dated 10/07/2018 was personally reviewed today and demonstrated:  Sinus rhythm with PAC and nonspecific ST-T changes.  Recent Labwork: 10/07/2018: ALT 14; AST 25 10/13/2018: BUN 10; Creatinine, Ser 0.64; Hemoglobin 9.2; Platelets 153; Potassium 4.5; Sodium 137   Other Studies Reviewed Today:  Echocardiogram 09/04/2015 ([facility]): AORTIC ROOT         Size: Normal   Dissection: INDETERM FOR DISSECTION  AORTIC VALVE     Leaflets: Tricuspid             Morphology: Normal     Mobility: Fully Mobile  LEFT VENTRICLE                                      Anterior: Normal         Size: Normal                                 Lateral: Normal  Contraction: Normal                                  Septal: Normal   Closest EF: >55% (Estimated)    Calc.EF: 60%        Apical: Normal    LV masses: No Masses                             Inferior: Normal          LVH: MILD LVH CONCENTRIC  Posterior: Normal  LV GLS(AVG): -18.1% Normal Range [ <= -16] Dias.FxClass: N/A  MITRAL VALVE     Leaflets: Normal                   Mobility: Fully mobile   Morphology: Normal  LEFT ATRIUM         Size: MILDLY ENLARGED    LA masses: No masses               Normal IAS  MAIN PA         Size: Normal  PULMONIC VALVE   Morphology: Normal     Mobility: Fully Mobile  RIGHT VENTRICLE         Size: Normal                    Free wall: Normal  Contraction: Normal                    RV masses: No Masses        TAPSE:   2.5 cm,  Normal Range [>= 1.6 cm]      RV Note: ANNULAR VELOCITY= 11CM/SEC  TRICUSPID VALVE     Leaflets: Normal                  Mobility: Fully mobile   Morphology: Normal  RIGHT ATRIUM         Size: Normal                     RA Other: None    RA masses: No masses  PERICARDIUM        Fluid: No effusion  INFERIOR VENACAVA         Size: Normal     Normal respiratory collapse  DOPPLER ECHO and OTHER SPECIAL PROCEDURES ------------------------------------    Aortic: No AR                  No AS     Mitral: TRIVIAL MR             No MS    MV Inflow E Vel.= 61.0 cm/s  MV Annulus E'Vel.= 11.0 cm/s  E/E'Ratio= 6  Tricuspid: TRIVIAL TR             No TS  Pulmonary: TRIVIAL PR             No PS      Other:  INTERPRETATION ---------------------------------------------------------------   NORMAL LEFT VENTRICULAR SYSTOLIC FUNCTION WITH MILD LVH   NORMAL RIGHT VENTRICULAR SYSTOLIC FUNCTION   VALVULAR REGURGITATION: TRIVIAL MR, TRIVIAL PR, TRIVIAL TR   NO VALVULAR STENOSIS   NO PRIOR STUDY FOR COMPARISON   3D acquisition and reconstructions were performed as part of this   examination to more accurately quantify the effects of heart failure   regardless of ejection fraction.  Assessment and Plan:  1.  Intermittent palpitations, probable sinus tachycardia demonstrated by ECG today.  Available outside tracings also show sinus tachycardia although cannot completely exclude an atrial tachycardia.  He has had no syncope.  No cough or hemoptysis, not hypoxic today.  Last assessment of LVEF in 2017  demonstrated normal cardiac function.  Plan is to obtain an echocardiogram for reevaluation and also a 7-day ZIO patch for further investigation of heart rhythm.  2.  Essential hypertension by history.  He is currently on Zestoretic with systolic 123456 today.  3.  Longstanding history of tobacco abuse, he quit smoking  back in January.  4.  Mixed hyperlipidemia, on Lipitor.  Medication Adjustments/Labs and Tests Ordered: Current medicines are reviewed at length with the patient today.  Concerns regarding medicines are outlined above.   Tests Ordered: Orders Placed This Encounter  Procedures  . LONG TERM MONITOR (3-14 DAYS)  . EKG 12-Lead  . ECHOCARDIOGRAM COMPLETE    Medication Changes: No orders of the defined types were placed in this encounter.   Disposition:  Follow up test results, clinical visit in 6 weeks, sooner if needed.  Signed, Satira Sark, MD, [facility] 12/16/2018 1:59 PM    [facility] [address], [place], [place] [address] Phone: [phone]; Fax: [phone]

## 2018-12-16 NOTE — Telephone Encounter (Signed)
Pre-cert Verification for the following procedure    ZIO 7 DAY MONITOR

## 2018-12-27 ENCOUNTER — Other Ambulatory Visit (INDEPENDENT_AMBULATORY_CARE_PROVIDER_SITE_OTHER): Payer: No Typology Code available for payment source

## 2018-12-27 DIAGNOSIS — R002 Palpitations: Secondary | ICD-10-CM | POA: Diagnosis not present

## 2019-01-10 ENCOUNTER — Encounter: Payer: Self-pay | Admitting: *Deleted

## 2019-01-10 ENCOUNTER — Telehealth: Payer: Self-pay | Admitting: *Deleted

## 2019-01-10 MED ORDER — METOPROLOL SUCCINATE ER 25 MG PO TB24: 25.0000 mg | ORAL_TABLET | Freq: Every day | ORAL | 2 refills | Status: DC

## 2019-01-10 NOTE — Telephone Encounter (Signed)
Patient informed and verbalized understanding of plan. Toprol xl rx sent to [facility] in [place]

## 2019-01-10 NOTE — Telephone Encounter (Signed)
-----   Message from Satira Sark, MD sent at 01/10/2019 12:58 PM EST ----- Results reviewed.  Cardiac rhythm is sinus with average heart rate in the 80s.  He does have brief episodes of SVT which he may be feeling as palpitations.  Would initiate Toprol-XL 25 mg daily for now.  Keep scheduled follow-up.

## 2019-01-10 NOTE — Telephone Encounter (Signed)
This encounter was created in error - please disregard.

## 2019-01-13 ENCOUNTER — Other Ambulatory Visit: Payer: Self-pay

## 2019-01-13 ENCOUNTER — Ambulatory Visit (INDEPENDENT_AMBULATORY_CARE_PROVIDER_SITE_OTHER): Payer: No Typology Code available for payment source

## 2019-01-13 DIAGNOSIS — R0602 Shortness of breath: Secondary | ICD-10-CM | POA: Diagnosis not present

## 2019-01-13 DIAGNOSIS — R9431 Abnormal electrocardiogram [ECG] [EKG]: Secondary | ICD-10-CM | POA: Diagnosis not present

## 2019-01-14 ENCOUNTER — Telehealth: Payer: Self-pay | Admitting: *Deleted

## 2019-01-14 NOTE — Telephone Encounter (Signed)
Patient informed. Copy sent to PCP °

## 2019-01-14 NOTE — Telephone Encounter (Signed)
-----   Message from Satira Sark, MD sent at 01/14/2019  7:58 AM EST ----- Results reviewed.  LVEF is normal at 60 to 65%, no major valvular abnormalities to explain symptoms.  Continue with current follow-up plan.

## 2019-01-24 NOTE — Patient Instructions (Signed)
Danny Hampton  01/24/2019     @PREFPERIOPPHARMACY @   Your procedure is scheduled on  01/31/2019.  Report to [facility] at  1035  A.M.  Call this number if you have problems the morning of surgery:  [phone]   Remember:  Do not eat or drink after midnight.                        Take these medicines the morning of surgery with A SIP OF WATER  Cardidopa, hydroxyzine, mobic, metoprolol, protonix, dilantin, requip.    Do not wear jewelry, make-up or nail polish.  Do not wear lotions, powders, or perfumes. Please wear deodorant and brush your teeth.  Do not shave 48 hours prior to surgery.  Men may shave face and neck.  Do not bring valuables to the hospital.  [facility] is not responsible for any belongings or valuables.  Contacts, dentures or bridgework may not be worn into surgery.  Leave your suitcase in the car.  After surgery it may be brought to your room.  For patients admitted to the hospital, discharge time will be determined by your treatment team.  Patients discharged the day of surgery will not be allowed to drive home.   Name and phone number of your driver:   family Special instructions:  None  Please read over the following fact sheets that you were given. Anesthesia Post-op Instructions and Care and Recovery After Surgery       Cataract Surgery, Care After This sheet gives you information about how to care for yourself after your procedure. Your health care provider may also give you more specific instructions. If you have problems or questions, contact your health care provider. What can I expect after the procedure? After the procedure, it is common to have:  Itching.  Discomfort.  Fluid discharge.  Sensitivity to light and to touch.  Bruising in or around the eye.  Mild blurred vision. Follow these instructions at home: Eye care   Do not touch or rub your eyes.  Protect your eyes as told by your health care provider. You  may be told to wear a protective eye shield or sunglasses.  Do not put a contact lens into the affected eye or eyes until your health care provider approves.  Keep the area around your eye clean and dry: ? Avoid swimming. ? Do not allow water to hit you directly in the face while showering. ? Keep soap and shampoo out of your eyes.  Check your eye every day for signs of infection. Watch for: ? Redness, swelling, or pain. ? Fluid, blood, or pus. ? Warmth. ? A bad smell. ? Vision that is getting worse. ? Sensitivity that is getting worse. Activity  Do not drive for 24 hours if you were given a sedative during your procedure.  Avoid strenuous activities, such as playing contact sports, for as long as told by your health care provider.  Do not drive or use heavy machinery until your health care provider approves.  Do not bend or lift heavy objects. Bending increases pressure in the eye. You can walk, climb stairs, and do light household chores.  Ask your health care provider when you can return to work. If you work in a dusty environment, you may be advised to wear protective eyewear for a period of time. General instructions  Take or apply over-the-counter and prescription medicines only as told by your health  care provider. This includes eye drops.  Keep all follow-up visits as told by your health care provider. This is important. Contact a health care provider if:  You have increased bruising around your eye.  You have pain that is not helped with medicine.  You have a fever.  You have redness, swelling, or pain in your eye.  You have fluid, blood, or pus coming from your incision.  Your vision gets worse.  Your sensitivity to light gets worse. Get help right away if:  You have sudden loss of vision.  You see flashes of light or spots (floaters).  You have severe eye pain.  You develop nausea or vomiting. Summary  After your procedure, it is common to have  itching, discomfort, bruising, fluid discharge, or sensitivity to light.  Follow instructions from your health care provider about caring for your eye after the procedure.  Do not rub your eye after the procedure. You may need to wear eye protection or sunglasses. Do not wear contact lenses. Keep the area around your eye clean and dry.  Avoid activities that require a lot of effort. These include playing sports and lifting heavy objects.  Contact a health care provider if you have increased bruising, pain that does not go away, or a fever. Get help right away if you suddenly lose your vision, see flashes of light or spots, or have severe pain in the eye. This information is not intended to replace advice given to you by your health care provider. Make sure you discuss any questions you have with your health care provider. Document Released: 09/13/2004 Document Revised: 08/24/2017 Document Reviewed: 08/24/2017 Elsevier Patient Education  2020 [employer] After These instructions provide you with information about caring for yourself after your procedure. Your health care provider may also give you more specific instructions. Your treatment has been planned according to current medical practices, but problems sometimes occur. Call your health care provider if you have any problems or questions after your procedure. What can I expect after the procedure? After your procedure, you may:  Feel sleepy for several hours.  Feel clumsy and have poor balance for several hours.  Feel forgetful about what happened after the procedure.  Have poor judgment for several hours.  Feel nauseous or vomit.  Have a sore throat if you had a breathing tube during the procedure. Follow these instructions at home: For at least 24 hours after the procedure:      Have a responsible adult stay with you. It is important to have someone help care for you until you are awake and  alert.  Rest as needed.  Do not: ? Participate in activities in which you could fall or become injured. ? Drive. ? Use heavy machinery. ? Drink alcohol. ? Take sleeping pills or medicines that cause drowsiness. ? Make important decisions or sign legal documents. ? Take care of children on your own. Eating and drinking  Follow the diet that is recommended by your health care provider.  If you vomit, drink water, juice, or soup when you can drink without vomiting.  Make sure you have little or no nausea before eating solid foods. General instructions  Take over-the-counter and prescription medicines only as told by your health care provider.  If you have sleep apnea, surgery and certain medicines can increase your risk for breathing problems. Follow instructions from your health care provider about wearing your sleep device: ? Anytime you are sleeping, including during  daytime naps. ? While taking prescription pain medicines, sleeping medicines, or medicines that make you drowsy.  If you smoke, do not smoke without supervision.  Keep all follow-up visits as told by your health care provider. This is important. Contact a health care provider if:  You keep feeling nauseous or you keep vomiting.  You feel light-headed.  You develop a rash.  You have a fever. Get help right away if:  You have trouble breathing. Summary  For several hours after your procedure, you may feel sleepy and have poor judgment.  Have a responsible adult stay with you for at least 24 hours or until you are awake and alert. This information is not intended to replace advice given to you by your health care provider. Make sure you discuss any questions you have with your health care provider. Document Released: 06/17/2015 Document Revised: 05/25/2017 Document Reviewed: 06/17/2015 Elsevier Patient Education  2020 [employer].

## 2019-01-25 NOTE — H&P (Signed)
Surgical History & Physical  Patient Name: Danny Hampton DOB: 10-May-1955  Surgery: Cataract extraction with intraocular lens implant phacoemulsification; Left Eye  Surgeon: Baruch Goldmann MD Surgery Date:  01/28/2019 Pre-Op Date:  01/24/2019  HPI: A 67 Yr. old male patient 1. 1. The patient complains of difficulty when viewing TV, reading closed caption, news scrolls on TV, which began many years ago. Both eyes are affected. The episode is gradual. The condition's severity increased since last visit. Symptoms occur when the patient is driving and outside. This is negatively affecting the patient's quality of life. The complaint is associated with halos. C/O floaters OU all times. No flashes. Hx of LASIK OU 2004. HPI was performed by Baruch Goldmann .  Medical History: Dry Eyes Cataracts Astigmatism Macula Degeneration Diabetes - DM Type 2 Heart Problem High Blood Pressure LDL Muscular dis( no definite diagnosis yet)  Review of Systems Negative Allergic/Immunologic Negative Cardiovascular Negative Constitutional Negative Ear, Nose, Mouth & Throat Negative Endocrine Negative Eyes Negative Gastrointestinal Negative Genitourinary Negative Hemotologic/Lymphatic Negative Integumentary Negative Musculoskeletal Negative Neurological Negative Psychiatry Negative Respiratory  Social   Never smoked   Medication Hydroxyzine, Metformin, Phenytoin, Atorvastatin, Meloxicam, Pantoprazole, Lisinopril-HCTZ, Carbidopa/Leodopa, Ropinrole hcl,   Sx/Procedures Lasik,  Back Surgery, Knee Replacement, Carpal Tunnel,   Drug Allergies   NKDA  History & Physical: Heent:  Cataract, Left eye NECK: supple without bruits LUNGS: lungs clear to auscultation CV: regular rate and rhythm Abdomen: soft and non-tender  Impression & Plan: Assessment: 1.  NUCLEAR SCLEROSIS AGE RELATED; Both Eyes (H25.13) 2.  Diabetes Type 2 No retinopathy (E11.9) 3.  ASTIGMATISM, REGULAR; Both Eyes  (H52.223)  Plan: 1.  Cataract accounts for the patient's decreased vision. This visual impairment is not correctable with a tolerable change in glasses or contact lenses. Cataract surgery with an implantation of a new lens should significantly improve the visual and functional status of the patient.  Discussed all risks, benefits, alternatives, and potential complications. Discussed the procedures and recovery. Patient desires to have surgery. A-scan ordered and performed today for intra-ocular lens calculations. The surgery will be performed in order to improve vision for driving, reading, and for eye examinations. Recommend phacoemulsification with intra-ocular lens. Left Eye. Dilates moderately - shugarcaine by protocol. Consider Panoptix Toric lens - veteran discount - waive surgeon fee. S/P Myopic Lasik - Barrett True K 2.  Stressed importance of blood sugar and blood pressure control, and also yearly eye examinations. Discussed the need for ongoing proactive ocular exams and treatment, hopefully before visual symptoms develop. 3.  Consider toric lens.

## 2019-01-26 ENCOUNTER — Ambulatory Visit (INDEPENDENT_AMBULATORY_CARE_PROVIDER_SITE_OTHER): Payer: No Typology Code available for payment source | Admitting: Orthopaedic Surgery

## 2019-01-26 ENCOUNTER — Encounter: Payer: Self-pay | Admitting: Orthopaedic Surgery

## 2019-01-26 VITALS — Ht 67.0 in | Wt 170.0 lb

## 2019-01-26 DIAGNOSIS — M170 Bilateral primary osteoarthritis of knee: Secondary | ICD-10-CM | POA: Diagnosis not present

## 2019-01-26 DIAGNOSIS — Z96651 Presence of right artificial knee joint: Secondary | ICD-10-CM | POA: Diagnosis not present

## 2019-01-26 MED ORDER — LIDOCAINE HCL 1 % IJ SOLN
2.0000 mL | INTRAMUSCULAR | Status: AC | PRN
  Administered 2019-01-26: 11:00:00 2 mL

## 2019-01-26 MED ORDER — METHYLPREDNISOLONE ACETATE 40 MG/ML IJ SUSP
80.0000 mg | INTRAMUSCULAR | Status: AC | PRN
  Administered 2019-01-26: 80 mg via INTRA_ARTICULAR

## 2019-01-26 MED ORDER — BUPIVACAINE HCL 0.5 % IJ SOLN
2.0000 mL | INTRAMUSCULAR | Status: AC | PRN
  Administered 2019-01-26: 2 mL via INTRA_ARTICULAR

## 2019-01-26 NOTE — Progress Notes (Signed)
Office Visit Note   Patient: Danny Hampton           Date of Birth: 30-Mar-1955           MRN: [id number] Visit Date: 01/26/2019              Requested by: Myrla Halsted, MD [facility] [place] [id number] PCP: System, Pcp Not In   Assessment & Plan: Visit Diagnoses:  1. Bilateral primary osteoarthritis of knee   2. Total knee replacement status, right     Plan: Over 3 months status post right total knee replacement doing relatively well.  Does not use any ambulatory aid.  Continues to be followed through his pain clinic.  Has occasional popping clicking and swelling but is walking up to a mile a day.  Have encouraged him to continue with strengthening exercises.  Having arthritic symptoms in the left knee with previous films consistent with mild arthritis.  Will inject with cortisone.  Office 6 months  Follow-Up Instructions: Return in about 6 months (around 07/26/2019).   Orders:  Orders Placed This Encounter  Procedures  . Large Joint Inj: L knee   No orders of the defined types were placed in this encounter.     Procedures: Large Joint Inj: L knee on 01/26/2019 10:32 AM Indications: pain and diagnostic evaluation Details: 25 G 1.5 in needle, anteromedial approach  Arthrogram: No  Medications: 2 mL lidocaine 1 %; 2 mL bupivacaine 0.5 %; 80 mg methylPREDNISolone acetate 40 MG/ML Procedure, treatment alternatives, risks and benefits explained, specific risks discussed. Consent was given by the patient. Patient was prepped and draped in the usual sterile fashion.       Clinical Data: No additional findings.   Subjective: Chief Complaint  Patient presents with  . Right Knee - Pain  . Left Knee - Pain  Patient presents today for follow up on his right knee. He had a right total knee arthroplasty on 10/12/2018. He is now 3 months out from surgery. He states that he is still having pain at his patella. He rides his bike and walks for  exercise. He states that it feels like something "clunks" when he walks. The pain will sometimes wake him at night. He takes Naprosyn or Tylenol as needed.  He also states that he would like to get a cortisone injection in his left knee today. It has been a couple years since he received one on that side.  HPI  Review of Systems   Objective: Vital Signs: Ht 5\' 7"  (1.702 m)   Wt 170 lb (77.1 kg)   BMI 26.63 kg/m   Physical Exam  Ortho Exam awake alert and oriented x3.  Comfortable sitting.  Right total knee replaced with small effusion.  Knee was not warm.  No instability full extension and over 110 degrees of flexion.  No popliteal pain no calf pain.  Neurologically intact.  Experiencing some medial joint pain left knee distant with prior diagnosis of arthritis  Specialty Comments:  No specialty comments available.  Imaging: No results found.   PMFS History: Patient Active Problem List   Diagnosis Date Noted  . Total knee replacement status, right 10/12/2018  . Unilateral primary osteoarthritis, right knee 09/30/2018  . Cervicalgia 04/21/2018  . Bilateral primary osteoarthritis of knee 01/03/2016   Past Medical History:  Diagnosis Date  . Acid reflux   . Anxiety   . Arthritis   . Essential hypertension   . History  of skin cancer   . Myotonia congenita   . Type 2 diabetes mellitus ([place])     History reviewed. No pertinent family history.  Past Surgical History:  Procedure Laterality Date  . BACK SURGERY     times 2  . CARPAL TUNNEL RELEASE    . NOSE SURGERY    . TOTAL KNEE ARTHROPLASTY Right 10/12/2018   Procedure: RIGHT TOTAL KNEE ARTHROPLASTY;  Surgeon: Garald Balding, MD;  Location: [facility];  Service: Orthopedics;  Laterality: Right;   Social History   Occupational History  . Occupation: retired Clinical biochemist  Tobacco Use  . Smoking status: Former Smoker    Packs/day: 0.50    Years: 30.00    Pack years: 15.00    Types: Cigarettes    Quit date: 03/2018     Years since quitting: 0.8  . Smokeless tobacco: Never Used  Substance and Sexual Activity  . Alcohol use: Yes    Comment: occasional drink  . Drug use: No  . Sexual activity: Not on file

## 2019-01-27 ENCOUNTER — Encounter (HOSPITAL_COMMUNITY)
Admission: RE | Admit: 2019-01-27 | Discharge: 2019-01-27 | Disposition: A | Discharge status: Home / Self Care | Payer: No Typology Code available for payment source | Source: Ambulatory Visit | Attending: Ophthalmology | Admitting: Ophthalmology

## 2019-01-27 ENCOUNTER — Other Ambulatory Visit: Payer: Self-pay

## 2019-01-27 ENCOUNTER — Encounter: Payer: Self-pay | Admitting: Cardiology

## 2019-01-27 ENCOUNTER — Ambulatory Visit (INDEPENDENT_AMBULATORY_CARE_PROVIDER_SITE_OTHER): Payer: No Typology Code available for payment source | Admitting: Cardiology

## 2019-01-27 ENCOUNTER — Other Ambulatory Visit (HOSPITAL_COMMUNITY)
Admission: RE | Admit: 2019-01-27 | Discharge: 2019-01-27 | Disposition: A | Discharge status: Home / Self Care | Payer: No Typology Code available for payment source | Source: Ambulatory Visit | Attending: Ophthalmology | Admitting: Ophthalmology

## 2019-01-27 VITALS — BP 130/78 | HR 78 | Ht 67.0 in | Wt 178.0 lb

## 2019-01-27 DIAGNOSIS — I471 Supraventricular tachycardia: Secondary | ICD-10-CM

## 2019-01-27 DIAGNOSIS — E782 Mixed hyperlipidemia: Secondary | ICD-10-CM | POA: Diagnosis not present

## 2019-01-27 DIAGNOSIS — Z20828 Contact with and (suspected) exposure to other viral communicable diseases: Secondary | ICD-10-CM | POA: Diagnosis not present

## 2019-01-27 DIAGNOSIS — I10 Essential (primary) hypertension: Secondary | ICD-10-CM | POA: Diagnosis not present

## 2019-01-27 DIAGNOSIS — Z01812 Encounter for preprocedural laboratory examination: Secondary | ICD-10-CM | POA: Insufficient documentation

## 2019-01-27 DIAGNOSIS — H2512 Age-related nuclear cataract, left eye: Secondary | ICD-10-CM | POA: Insufficient documentation

## 2019-01-27 LAB — BASIC METABOLIC PANEL
Anion gap: 12 (ref 5–15)
BUN: 15 mg/dL (ref 8–23)
CO2: 28 mmol/L (ref 22–32)
Calcium: 9.5 mg/dL (ref 8.9–10.3)
Chloride: 96 mmol/L — ABNORMAL LOW (ref 98–111)
Creatinine, Ser: 0.56 mg/dL — ABNORMAL LOW (ref 0.61–1.24)
GFR calc Af Amer: >60 mL/min (ref >60)
GFR calc non Af Amer: >60 mL/min (ref >60)
Glucose, Bld: 112 mg/dL — ABNORMAL HIGH (ref 70–99)
Potassium: 4 mmol/L (ref 3.5–5.1)
Sodium: 136 mmol/L (ref 135–145)

## 2019-01-27 LAB — SARS CORONAVIRUS 2 (TAT 6-24 HRS): SARS Coronavirus 2: NEGATIVE

## 2019-01-27 LAB — HEMOGLOBIN A1C
Hgb A1c MFr Bld: 5.8 % — ABNORMAL HIGH (ref 4.8–5.6)
Mean Plasma Glucose: 119.76 mg/dL

## 2019-01-27 LAB — GLUCOSE, CAPILLARY: Glucose-Capillary: 112 mg/dL — ABNORMAL HIGH (ref 70–99)

## 2019-01-27 NOTE — Patient Instructions (Addendum)

## 2019-01-27 NOTE — Progress Notes (Signed)
Cardiology Office Note  Date: 01/27/2019   ID: Ibraham Nedd, DOB 1955-09-17, MRN [id number]  PCP:  System, Pcp Not In  Cardiologist:  Rozann Lesches, MD Electrophysiologist:  None   Chief Complaint  Patient presents with  . Follow-up testing    History of Present Illness: Danny Hampton is a 63 y.o. male seen in consultation in October and presenting for a follow-up visit.  He tells me that he has had a few episodes of palpitations since then.  No chest pain or progressive shortness of breath.  Echocardiogram revealed LVEF 60 to 65% with mild diastolic dysfunction, no major valvular abnormalities.  Cardiac monitor showed sinus rhythm with average heart rate in the 80s.  He did have brief episodes of SVT and was started on Toprol-XL.  Unfortunately, he has not yet received the medication from the [facility] system.  The remainder of his medications are unchanged and outlined below.  Past Medical History:  Diagnosis Date  . Acid reflux   . Anxiety   . Arthritis   . Essential hypertension   . History of skin cancer   . Myotonia congenita   . Type 2 diabetes mellitus ([place])     Past Surgical History:  Procedure Laterality Date  . BACK SURGERY     times 2  . CARPAL TUNNEL RELEASE    . NOSE SURGERY    . TOTAL KNEE ARTHROPLASTY Right 10/12/2018   Procedure: RIGHT TOTAL KNEE ARTHROPLASTY;  Surgeon: Garald Balding, MD;  Location: [facility];  Service: Orthopedics;  Laterality: Right;    Current Outpatient Medications  Medication Sig Dispense Refill  . aspirin 81 MG chewable tablet Chew 1 tablet (81 mg total) by mouth 2 (two) times daily. (Patient taking differently: Chew 81 mg by mouth daily. )    . atorvastatin (LIPITOR) 80 MG tablet Take 40 mg by mouth daily.     [name] b complex vitamins tablet Take 1 tablet by mouth daily.    . Carbidopa-Levodopa ER 48.75-195 MG CPCR Take 1 tablet by mouth 4 (four) times daily.    . Cholecalciferol (VITAMIN D3) 125 MCG (5000  UT) TABS Take 5,000 Units by mouth daily.     . hydrOXYzine (ATARAX/VISTARIL) 25 MG tablet Take 25 mg by mouth 4 (four) times daily as needed for anxiety.   0  . lisinopril-hydrochlorothiazide (ZESTORETIC) 20-25 MG tablet Take 1 tablet by mouth daily.    [name] loratadine (CLARITIN) 10 MG tablet Take 10 mg by mouth daily.    . meloxicam (MOBIC) 15 MG tablet Take 15 mg by mouth daily.    . metFORMIN (GLUCOPHAGE) 500 MG tablet Take 500 mg by mouth 2 (two) times daily with a meal.     . metoprolol succinate (TOPROL XL) 25 MG 24 hr tablet Take 1 tablet (25 mg total) by mouth daily. 90 tablet 2  . Multiple Vitamin (MULTIVITAMIN PO) Take 1 tablet by mouth daily.    . pantoprazole (PROTONIX) 40 MG tablet Take 40 mg by mouth daily.    . phenytoin (DILANTIN) 100 MG ER capsule Take 100 mg by mouth 3 (three) times daily.     . Polyvinyl Alcohol-Povidone PF (REFRESH) 1.4-0.6 % SOLN Place 1 drop into both eyes 3 (three) times daily.    [name] rOPINIRole (REQUIP) 1 MG tablet Take 0.5 mg by mouth 2 (two) times daily.     [name] zinc gluconate 50 MG tablet Take 50 mg by mouth daily.     No  current facility-administered medications for this visit.    Allergies:  Patient has no known allergies.   Social History: The patient  reports that he quit smoking about 10 months ago. His smoking use included cigarettes. He has a 15.00 pack-year smoking history. He has never used smokeless tobacco. He reports current alcohol use. He reports that he does not use drugs.   ROS:  Please see the history of present illness. Otherwise, complete review of systems is positive for none.  All other systems are reviewed and negative.   Physical Exam: VS:  BP 130/78   Pulse 78   Ht 5\' 7"  (1.702 m)   Wt 178 lb (80.7 kg)   SpO2 99%   BMI 27.88 kg/m , BMI Body mass index is 27.88 kg/m.  Wt Readings from Last 3 Encounters:  01/27/19 178 lb (80.7 kg)  01/26/19 170 lb (77.1 kg)  12/16/18 177 lb 6.4 oz (80.5 kg)    General: Patient appears  comfortable at rest. HEENT: Conjunctiva and lids normal, wearing a mask. Neck: Supple, no elevated JVP or carotid bruits, no thyromegaly. Lungs: Clear to auscultation, nonlabored breathing at rest. Cardiac: Regular rate and rhythm, no S3, soft systolic murmur, no pericardial rub. Abdomen: Soft, nontender, bowel sounds present. Extremities: No pitting edema, distal pulses 2+.  ECG:  An ECG dated 12/16/2018 was personally reviewed today and demonstrated:  Sinus tachycardia with diffuse nonspecific ST-T abnormalities.  Recent Labwork: 10/07/2018: ALT 14; AST 25 10/13/2018: BUN 10; Creatinine, Ser 0.64; Hemoglobin 9.2; Platelets 153; Potassium 4.5; Sodium 137   Other Studies Reviewed Today:  Echocardiogram 01/13/2019:  1. Left ventricular ejection fraction, by visual estimation, is 60 to 65%. The left ventricle has normal function. There is no left ventricular hypertrophy.  2. Left ventricular diastolic parameters are consistent with Grade I diastolic dysfunction (impaired relaxation).  3. Global right ventricle has normal systolic function.The right ventricular size is normal. No increase in right ventricular wall thickness.  4. Left atrial size was normal.  5. Right atrial size was normal.  6. Mild aortic valve annular calcification.  7. The mitral valve is normal in structure. Trace mitral valve regurgitation. No evidence of mitral stenosis.  8. The tricuspid valve is normal in structure. Tricuspid valve regurgitation is not demonstrated.  9. The aortic valve is tricuspid. Aortic valve regurgitation is not visualized. No evidence of aortic valve sclerosis or stenosis. 10. Mild pulmonic stenosis. 11. The pulmonic valve was not well visualized. Pulmonic valve regurgitation is not visualized. 12. Normal pulmonary artery systolic pressure. 13. The inferior vena cava is normal in size with greater than 50% respiratory variability, suggesting right atrial pressure of 3 mmHg.  Zio patch 12/27/2018:  Zio patch reviewed, 5 days 8 hours monitored.  Predominant rhythm is sinus.  Heart rate ranged from 54 beats minute up to 136 bpm with average heart rate 85 bpm.  Rare PACs and PVCs noted representing less than 1% of total beats.  There were brief episodes of SVT, the longest lasting 12 beats at 190 bpm.  No sustained arrhythmias or pauses.  Assessment and Plan:  1.  Intermittent palpitations with interval documentation of brief episodes of SVT by cardiac monitor.  LVEF is normal by echocardiogram and he has no major valvular abnormalities.  Plan is to see how he does on Toprol-XL 25 mg daily.  This can be uptitrated if needed.  2.  Essential hypertension.  He is on Zestoretic as an outpatient with follow-up by the [facility] system.  Systolic is in the [id number] today.  3.  Mixed hyperlipidemia, continues on Lipitor with follow-up by the [facility] system.  Medication Adjustments/Labs and Tests Ordered: Current medicines are reviewed at length with the patient today.  Concerns regarding medicines are outlined above.   Tests Ordered: No orders of the defined types were placed in this encounter.   Medication Changes: No orders of the defined types were placed in this encounter.   Disposition:  Follow up 6 months in the [place] office.  Signed, Satira Sark, MD, [facility] 01/27/2019 8:36 AM    [facility] [place], [place], [place] [address] Phone: [phone]; Fax: [phone]

## 2019-01-31 ENCOUNTER — Ambulatory Visit (HOSPITAL_COMMUNITY)
Admission: RE | Admit: 2019-01-31 | Discharge: 2019-01-31 | Disposition: A | Discharge status: Home / Self Care | Payer: No Typology Code available for payment source | Attending: Ophthalmology | Admitting: Ophthalmology

## 2019-01-31 ENCOUNTER — Encounter (HOSPITAL_COMMUNITY)
Admission: RE | Disposition: A | Discharge status: Home / Self Care | Payer: Self-pay | Source: Home / Self Care | Attending: Ophthalmology

## 2019-01-31 ENCOUNTER — Ambulatory Visit (HOSPITAL_COMMUNITY): Payer: No Typology Code available for payment source | Admitting: Anesthesiology

## 2019-01-31 ENCOUNTER — Encounter (HOSPITAL_COMMUNITY): Payer: Self-pay | Admitting: *Deleted

## 2019-01-31 DIAGNOSIS — E1136 Type 2 diabetes mellitus with diabetic cataract: Secondary | ICD-10-CM | POA: Insufficient documentation

## 2019-01-31 DIAGNOSIS — M1990 Unspecified osteoarthritis, unspecified site: Secondary | ICD-10-CM | POA: Insufficient documentation

## 2019-01-31 DIAGNOSIS — Z87891 Personal history of nicotine dependence: Secondary | ICD-10-CM | POA: Insufficient documentation

## 2019-01-31 DIAGNOSIS — Z79899 Other long term (current) drug therapy: Secondary | ICD-10-CM | POA: Insufficient documentation

## 2019-01-31 DIAGNOSIS — H2512 Age-related nuclear cataract, left eye: Secondary | ICD-10-CM | POA: Diagnosis not present

## 2019-01-31 DIAGNOSIS — Z791 Long term (current) use of non-steroidal anti-inflammatories (NSAID): Secondary | ICD-10-CM | POA: Diagnosis not present

## 2019-01-31 DIAGNOSIS — I10 Essential (primary) hypertension: Secondary | ICD-10-CM | POA: Insufficient documentation

## 2019-01-31 DIAGNOSIS — K219 Gastro-esophageal reflux disease without esophagitis: Secondary | ICD-10-CM | POA: Insufficient documentation

## 2019-01-31 DIAGNOSIS — Z7984 Long term (current) use of oral hypoglycemic drugs: Secondary | ICD-10-CM | POA: Insufficient documentation

## 2019-01-31 DIAGNOSIS — H52202 Unspecified astigmatism, left eye: Secondary | ICD-10-CM | POA: Diagnosis not present

## 2019-01-31 HISTORY — PX: CATARACT EXTRACTION W/PHACO: SHX586

## 2019-01-31 LAB — GLUCOSE, CAPILLARY: Glucose-Capillary: 121 mg/dL — ABNORMAL HIGH (ref 70–99)

## 2019-01-31 SURGERY — PHACOEMULSIFICATION, CATARACT, WITH IOL INSERTION
Anesthesia: Monitor Anesthesia Care | Site: Eye | Laterality: Left

## 2019-01-31 MED ORDER — EPINEPHRINE PF 1 MG/ML IJ SOLN
INTRAMUSCULAR | Status: AC
  Filled 2019-01-31: qty 2

## 2019-01-31 MED ORDER — LIDOCAINE HCL (PF) 1 % IJ SOLN
INTRAOCULAR | Status: DC | PRN
  Administered 2019-01-31: 1 mL via OPHTHALMIC

## 2019-01-31 MED ORDER — LIDOCAINE HCL 3.5 % OP GEL: 1.0000 "application " | Freq: Once | OPHTHALMIC | Status: DC

## 2019-01-31 MED ORDER — EPINEPHRINE PF 1 MG/ML IJ SOLN
INTRAOCULAR | Status: DC | PRN
  Administered 2019-01-31: 500 mL

## 2019-01-31 MED ORDER — NEOMYCIN-POLYMYXIN-DEXAMETH 3.5-10000-0.1 OP SUSP
OPHTHALMIC | Status: DC | PRN
  Administered 2019-01-31: 1 [drp] via OPHTHALMIC

## 2019-01-31 MED ORDER — CYCLOPENTOLATE-PHENYLEPHRINE 0.2-1 % OP SOLN
1.0000 [drp] | OPHTHALMIC | Status: AC | PRN
  Administered 2019-01-31 (×3): 1 [drp] via OPHTHALMIC

## 2019-01-31 MED ORDER — POVIDONE-IODINE 5 % OP SOLN
OPHTHALMIC | Status: DC | PRN
  Administered 2019-01-31: 1 via OPHTHALMIC

## 2019-01-31 MED ORDER — PROVISC 10 MG/ML IO SOLN
INTRAOCULAR | Status: DC | PRN
  Administered 2019-01-31: 0.85 mL via INTRAOCULAR

## 2019-01-31 MED ORDER — SODIUM HYALURONATE 23 MG/ML IO SOLN
INTRAOCULAR | Status: DC | PRN
  Administered 2019-01-31: 0.6 mL via INTRAOCULAR

## 2019-01-31 MED ORDER — TETRACAINE HCL 0.5 % OP SOLN
1.0000 [drp] | OPHTHALMIC | Status: AC | PRN
  Administered 2019-01-31 (×3): 1 [drp] via OPHTHALMIC

## 2019-01-31 MED ORDER — PHENYLEPHRINE HCL 2.5 % OP SOLN
1.0000 [drp] | OPHTHALMIC | Status: AC | PRN
  Administered 2019-01-31 (×3): 1 [drp] via OPHTHALMIC

## 2019-01-31 MED ORDER — BSS IO SOLN
INTRAOCULAR | Status: DC | PRN
  Administered 2019-01-31: 15 mL via INTRAOCULAR

## 2019-01-31 MED ORDER — LACTATED RINGERS IV SOLN: INTRAVENOUS | Status: DC

## 2019-01-31 SURGICAL SUPPLY — 15 items
CLOTH BEACON ORANGE TIMEOUT ST (SAFETY) ×2 IMPLANT
EYE SHIELD UNIVERSAL CLEAR (GAUZE/BANDAGES/DRESSINGS) ×2 IMPLANT
GLOVE BIOGEL PI IND STRL 7.0 (GLOVE) IMPLANT
GLOVE BIOGEL PI INDICATOR 7.0 (GLOVE) ×4
LENS IOL TORIC SYMFONY 20.0 ×2 IMPLANT
NDL HYPO 18GX1.5 BLUNT FILL (NEEDLE) IMPLANT
NEEDLE HYPO 18GX1.5 BLUNT FILL (NEEDLE) ×3 IMPLANT
PAD ARMBOARD 7.5X6 YLW CONV (MISCELLANEOUS) ×2 IMPLANT
PROC W SPEC LENS (INTRAOCULAR LENS) ×3
PROCESS W SPEC LENS (INTRAOCULAR LENS) IMPLANT
SYR TB 1ML LL NO SAFETY (SYRINGE) ×2 IMPLANT
TAPE SURG TRANSPORE 1 IN (GAUZE/BANDAGES/DRESSINGS) IMPLANT
TAPE SURGICAL TRANSPORE 1 IN (GAUZE/BANDAGES/DRESSINGS) ×2
VISCOELASTIC ADDITIONAL (OPHTHALMIC RELATED) ×2 IMPLANT
WATER STERILE IRR 250ML POUR (IV SOLUTION) ×2 IMPLANT

## 2019-01-31 NOTE — Anesthesia Preprocedure Evaluation (Signed)
Anesthesia Evaluation  Patient identified by MRN, date of birth, ID band Patient awake    Reviewed: Allergy & Precautions, NPO status , Patient's Chart, lab work & pertinent test results  Airway Mallampati: II  TM Distance: >3 FB Neck ROM: Full    Dental no notable dental hx. (+) Edentulous Upper   Pulmonary neg pulmonary ROS, former smoker,    Pulmonary exam normal breath sounds clear to auscultation       Cardiovascular Exercise Tolerance: Good hypertension, Pt. on medications Normal cardiovascular examI+ Valvular Problems/Murmurs  Rhythm:Regular Rate:Normal  S/p recent Echo -PS noted -will be started on B blocker    Neuro/Psych Anxiety  Neuromuscular disease negative psych ROS   GI/Hepatic Neg liver ROS, GERD  Medicated and Controlled,  Endo/Other  negative endocrine ROSdiabetes  Renal/GU negative Renal ROS  negative genitourinary   Musculoskeletal  (+) Arthritis ,   Abdominal   Peds negative pediatric ROS (+)  Hematology negative hematology ROS (+)   Anesthesia Other Findings   Reproductive/Obstetrics negative OB ROS                             Anesthesia Physical Anesthesia Plan  ASA: III  Anesthesia Plan: MAC   Post-op Pain Management:    Induction: Intravenous  PONV Risk Score and Plan: 1 and TIVA and Treatment may vary due to age or medical condition  Airway Management Planned: Nasal Cannula and Simple Face Mask  Additional Equipment:   Intra-op Plan:   Post-operative Plan:   Informed Consent: I have reviewed the patients History and Physical, chart, labs and discussed the procedure including the risks, benefits and alternatives for the proposed anesthesia with the patient or authorized representative who has indicated his/her understanding and acceptance.     Dental advisory given  Plan Discussed with: CRNA  Anesthesia Plan Comments: (Plan Full PPE use Plan  MAC -WTP with same after Q&A)        Anesthesia Quick Evaluation

## 2019-01-31 NOTE — Op Note (Signed)
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 Rayan Ines, MD, MA  Anesthesia: MAC, Topical Akten  Complications: None  Estimated Blood Loss: <64m (minimal)  Specimens: None  Implants: As above  Indications:  Visually significant age-related cataract, Left Eye; Visually Significant Astigmatism, Left Eye  Procedure:  The patient was seen and identified in the pre-operative area. The operative eye was identified and dilated.  The operative eye was marked.  Pre-operative toric markers were used to mark the eye at 0 and 180 degrees. Topical anesthesia was administered to the operative eye.     The patient was then to the operative suite and placed in the supine position.  A timeout was performed confirming the patient, procedure to be performed, and all other relevant information.   The patient's face was prepped and draped in the usual fashion for intra-ocular surgery.  A lid speculum was placed into the operative eye and the surgical microscope moved into place and focused.  A superotemporal paracentesis was created using a 20 gauge paracentesis blade.  Shugarcaine was injected into the anterior chamber.  Viscoelastic was injected into the anterior chamber.  A temporal clear-corneal main wound incision was created using a 2.463mmicrokeratome.  A continuous curvilinear capsulorrhexis was initiated using an irrigating cystitome and completed using capsulorrhexis forceps.  Hydrodissection and hydrodeliniation were performed.  Viscoelastic was injected into the anterior chamber.   A phacoemulsification handpiece and a chopper as a second instrument were used to remove the nucleus and epinucleus. The irrigation/aspiration handpiece was used to remove any remaining cortical material.   The capsular bag was reinflated with viscoelastic, checked, and found to be intact.  The eye was marked to the per-op meridian of 7 degrees.  The intraocular lens was inserted into the capsular bag and dialed into place using a Kuglen hook to 7 degrees.  The irrigation/aspiration handpiece was used to remove any remaining viscoelastic.  The clear corneal wound and paracentesis wounds were then hydrated and checked with Weck-Cels to be watertight.  The lid-speculum and drape was removed, and the patient's face was cleaned with a wet and dry 4x4.  Maxitrol was instilled in the eye before a clear shield was taped over the eye. The patient was taken to the post-operative care unit in good condition, having tolerated the procedure well.  Post-Op Instructions: [facility] and will receive all other orders and instructions.

## 2019-01-31 NOTE — Anesthesia Postprocedure Evaluation (Signed)
Anesthesia Post Note  Patient: Danny Hampton  Procedure(s) Performed: CATARACT EXTRACTION PHACO AND INTRAOCULAR LENS PLACEMENT ([place]) (Left Eye)  Patient location during evaluation: Short Stay Anesthesia Type: MAC Level of consciousness: awake and alert and oriented Pain management: pain level controlled Vital Signs Assessment: post-procedure vital signs reviewed and stable Respiratory status: spontaneous breathing Cardiovascular status: stable Postop Assessment: no apparent nausea or vomiting Anesthetic complications: no     Last Vitals:  Vitals:   01/31/19 1039  BP: 132/85  Pulse: 68  Resp: 18  Temp: 36.7 C  SpO2: 96%    Last Pain:  Vitals:   01/31/19 1039  TempSrc: Oral  PainSc: 4                  Bretton Tandy

## 2019-01-31 NOTE — Transfer of Care (Signed)
Immediate Anesthesia Transfer of Care Note  Patient: Danny Hampton  Procedure(s) Performed: CATARACT EXTRACTION PHACO AND INTRAOCULAR LENS PLACEMENT ([facility]) (Left Eye)  Patient Location: Short Stay  Anesthesia Type:MAC  Level of Consciousness: awake  Airway & Oxygen Therapy: Patient Spontanous Breathing  Post-op Assessment: Report given to RN  Post vital signs: Reviewed  Last Vitals:  Vitals Value Taken Time  BP    Temp    Pulse    Resp    SpO2      Last Pain:  Vitals:   01/31/19 1039  TempSrc: Oral  PainSc: 4       Patients Stated Pain Goal: 8 (76/54/65 0354)  Complications: No apparent anesthesia complications

## 2019-01-31 NOTE — Interval H&P Note (Signed)
History and Physical Interval Note: The H and P was reviewed and updated. The patient was examined.  No changes were found after exam.  The surgical eye was marked.  01/31/2019 11:26 AM  Danny Hampton  has presented today for surgery, with the diagnosis of Nuclear sclerotic cataract - Left eye.  The various methods of treatment have been discussed with the patient and family. After consideration of risks, benefits and other options for treatment, the patient has consented to  Procedure(s) with comments: CATARACT EXTRACTION PHACO AND INTRAOCULAR LENS PLACEMENT ([place]) (Left) - left as a surgical intervention.  The patient's history has been reviewed, patient examined, no change in status, stable for surgery.  I have reviewed the patient's chart and labs.  Questions were answered to the patient's satisfaction.     Baruch Goldmann

## 2019-01-31 NOTE — Discharge Instructions (Signed)
Please discharge patient when stable, will follow up today with Dr. Naiomy Watters at the [facility] office immediately following discharge.  Leave shield in place until visit.  All paperwork with discharge instructions will be given at the office. ° °

## 2019-02-01 ENCOUNTER — Encounter (HOSPITAL_COMMUNITY): Payer: Self-pay | Admitting: Ophthalmology

## 2019-02-01 ENCOUNTER — Other Ambulatory Visit (HOSPITAL_COMMUNITY): Payer: Self-pay | Admitting: Endocrinology

## 2019-02-01 ENCOUNTER — Telehealth: Payer: Self-pay | Admitting: Cardiology

## 2019-02-01 ENCOUNTER — Other Ambulatory Visit: Payer: Self-pay | Admitting: *Deleted

## 2019-02-01 MED ORDER — METOPROLOL SUCCINATE ER 25 MG PO TB24: 25.0000 mg | ORAL_TABLET | Freq: Every day | ORAL | 2 refills | Status: DC

## 2019-02-01 NOTE — Telephone Encounter (Signed)
Danny Hampton called stating that he needs his prescription Metoprolol sent to the [place], [place].  He is requesting to speak with a nurse.

## 2019-02-01 NOTE — Telephone Encounter (Signed)
Toprol XL 25 mg rx faxed to [facility] [phone] [phone].

## 2019-02-09 ENCOUNTER — Encounter (HOSPITAL_COMMUNITY): Payer: No Typology Code available for payment source

## 2019-02-09 ENCOUNTER — Other Ambulatory Visit (HOSPITAL_COMMUNITY): Payer: No Typology Code available for payment source

## 2019-02-11 ENCOUNTER — Ambulatory Visit: Admit: 2019-02-11 | Payer: No Typology Code available for payment source | Admitting: Ophthalmology

## 2019-02-11 SURGERY — PHACOEMULSIFICATION, CATARACT, WITH IOL INSERTION
Anesthesia: Monitor Anesthesia Care | Laterality: Right

## 2019-03-02 ENCOUNTER — Other Ambulatory Visit: Payer: Self-pay

## 2019-03-02 ENCOUNTER — Ambulatory Visit
Admission: EM | Admit: 2019-03-02 | Discharge: 2019-03-02 | Disposition: A | Discharge status: Home / Self Care | Payer: No Typology Code available for payment source | Attending: Urgent Care | Admitting: Urgent Care

## 2019-03-02 DIAGNOSIS — M545 Low back pain, unspecified: Secondary | ICD-10-CM

## 2019-03-02 DIAGNOSIS — M549 Dorsalgia, unspecified: Secondary | ICD-10-CM

## 2019-03-02 DIAGNOSIS — Z9889 Other specified postprocedural states: Secondary | ICD-10-CM

## 2019-03-02 MED ORDER — PREDNISONE 20 MG PO TABS: ORAL_TABLET | ORAL | 0 refills | Status: DC

## 2019-03-02 MED ORDER — CYCLOBENZAPRINE HCL 5 MG PO TABS: 5.0000 mg | ORAL_TABLET | Freq: Three times a day (TID) | ORAL | 0 refills | Status: DC | PRN
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
## 2019-03-02 NOTE — Discharge Instructions (Addendum)
Make sure you follow up with your PCP, back surgeon for your back and to continue pursuing the MRI of your back.

## 2019-03-21 ENCOUNTER — Encounter: Payer: Self-pay | Admitting: Neurology

## 2019-03-21 ENCOUNTER — Other Ambulatory Visit: Payer: Self-pay

## 2019-03-21 ENCOUNTER — Ambulatory Visit (INDEPENDENT_AMBULATORY_CARE_PROVIDER_SITE_OTHER): Payer: No Typology Code available for payment source | Admitting: Neurology

## 2019-03-21 VITALS — BP 136/89 | HR 75 | Resp 14 | Ht 67.0 in | Wt 181.6 lb

## 2019-03-21 DIAGNOSIS — G7112 Myotonia congenita: Secondary | ICD-10-CM

## 2019-03-21 DIAGNOSIS — G2581 Restless legs syndrome: Secondary | ICD-10-CM | POA: Diagnosis not present

## 2019-03-21 MED ORDER — ROPINIROLE HCL 1 MG PO TABS: ORAL_TABLET | ORAL | 3 refills | Status: AC

## 2019-03-21 NOTE — Patient Instructions (Addendum)
Increase ropinirole 0.5mg  at 3pm and 1mg  at 9pm.   After one week, reduced carbidopa-levadopa to three times daily  Return to clinic in August/September 2021

## 2019-03-21 NOTE — Progress Notes (Signed)
[facility] Clinic Note - Initial Visit   Date: 03/21/19  Danny Hampton MRN: [id number] DOB: December 19, 1955   Dear Dr. Cynda Familia, FNP:  Thank you for your kind referral of Danny Hampton for consultation of RLS and myotonia congenita. Although his history is well known to you, please allow [place] to reiterate it for the purpose of our medical record. The patient was accompanied to the clinic by self.  History of Present Illness: Danny Hampton is a 64 y.o. right-handed male with hypertension, GERD, hyperlipidemia, depression, alcohol abuse, diabetes mellitus, restless leg syndrome, and tobacco use presenting for evaluation of RLS and myotonia congenita.   He was having muscle cramps and spasms since childhood and has been present for most of his life.  In 2000, he was evaluated by neurology which did genetic testing and EMG and was ultimately diagnosed with myotonia congenita in [place]. He was started on dilantin 100mg  three times daily.  He also was having a lot of restless sensation of the legs and started on carbidopa-levadopa which has been titrated to 48.75/195 four times (10am, 2, 6, and 10p) daily.for RLS.  He was started on ropinirole 1mg  at bedtime which was introduced in 2019.  He gets about 40-50% relief of RLS with his current regimen.  He takes iron supplement daily. He does not have any abnormal movements or weakness.  He walks unassisted.  No numbness/tingling.   Out-side paper records, electronic medical record, and images have been reviewed where available and summarized as:  Lab Results  Component Value Date   HGBA1C 5.8 (H) 01/27/2019    Past Medical History:  Diagnosis Date  . Acid reflux   . Anxiety   . Arthritis   . Essential hypertension   . History of skin cancer   . Myotonia congenita   . Type 2 diabetes mellitus ([place])     Past Surgical History:  Procedure Laterality Date  . BACK SURGERY     times 2  .  CARPAL TUNNEL RELEASE    . CATARACT EXTRACTION W/PHACO Left 01/31/2019   Procedure: CATARACT EXTRACTION PHACO AND INTRAOCULAR LENS PLACEMENT ([facility]);  Surgeon: Baruch Goldmann, MD;  Location: [facility];  Service: Ophthalmology;  Laterality: Left;  CDE: 2.66  . NOSE SURGERY    . TOTAL KNEE ARTHROPLASTY Right 10/12/2018   Procedure: RIGHT TOTAL KNEE ARTHROPLASTY;  Surgeon: Garald Balding, MD;  Location: [facility];  Service: Orthopedics;  Laterality: Right;     Medications:  Outpatient Encounter Medications as of 03/21/2019  Medication Sig  . aspirin 81 MG chewable tablet Chew 1 tablet (81 mg total) by mouth 2 (two) times daily. (Patient taking differently: Chew 81 mg by mouth daily. )  . atorvastatin (LIPITOR) 80 MG tablet Take 40 mg by mouth daily.   [name] b complex vitamins tablet Take 1 tablet by mouth daily.  . Carbidopa-Levodopa ER 48.75-195 MG CPCR Take 1 tablet by mouth 4 (four) times daily.  . Cholecalciferol (VITAMIN D3) 125 MCG (5000 UT) TABS Take 5,000 Units by mouth daily.   . hydrOXYzine (ATARAX/VISTARIL) 25 MG tablet Take 25 mg by mouth 4 (four) times daily as needed for anxiety.   [name] lisinopril-hydrochlorothiazide (ZESTORETIC) 20-25 MG tablet Take 1 tablet by mouth daily.  [name] loratadine (CLARITIN) 10 MG tablet Take 10 mg by mouth daily.  . meloxicam (MOBIC) 15 MG tablet Take 15 mg by mouth daily.  . metFORMIN (GLUCOPHAGE) 500 MG tablet Take 500 mg by mouth 2 (  two) times daily with a meal.   . metoprolol succinate (TOPROL XL) 25 MG 24 hr tablet Take 1 tablet (25 mg total) by mouth daily.  . Multiple Vitamin (MULTIVITAMIN PO) Take 1 tablet by mouth daily.  . pantoprazole (PROTONIX) 40 MG tablet Take 40 mg by mouth daily.  [name] PARoxetine (PAXIL) 20 MG tablet Take 20 mg by mouth daily.  . phenytoin (DILANTIN) 100 MG ER capsule Take 100 mg by mouth 3 (three) times daily.   . Polyvinyl Alcohol-Povidone PF (REFRESH) 1.4-0.6 % SOLN Place 1 drop into both eyes 3 (three) times daily.  [name] rOPINIRole  (REQUIP) 1 MG tablet Take 0.5 mg by mouth 2 (two) times daily.   [name] zinc gluconate 50 MG tablet Take 50 mg by mouth daily.  . [DISCONTINUED] cyclobenzaprine (FLEXERIL) 5 MG tablet Take 1 tablet (5 mg total) by mouth 3 (three) times daily as needed for muscle spasms. (Patient not taking: Reported on 03/21/2019)  . [DISCONTINUED] predniSONE (DELTASONE) 20 MG tablet Day 1-5: Take 2 tablets daily. Day 6-10: Take 1 tablet daily. Take tablets with breakfast. (Patient not taking: Reported on 03/21/2019)   No facility-administered encounter medications on file as of 03/21/2019.    Allergies: No Known Allergies  Family History: Family History  Problem Relation Age of Onset  . Healthy Mother   . Healthy Father     Social History: Social History   Tobacco Use  . Smoking status: Former Smoker    Packs/day: 0.50    Years: 30.00    Pack years: 15.00    Types: Cigarettes    Quit date: 03/2018    Years since quitting: 1.0  . Smokeless tobacco: Never Used  Substance Use Topics  . Alcohol use: Yes    Comment: occasional drink  . Drug use: No   Social History   Social History Narrative   Right handed    Vital Signs:  BP 136/89 (BP Location: Left Arm, Patient Position: Sitting)   Pulse 75   Resp 14   Ht 5\' 7"  (1.702 m)   Wt 181 lb 9.6 oz (82.4 kg)   SpO2 97%   BMI 28.44 kg/m    Neurological Exam: MENTAL STATUS including orientation to time, place, person, recent and remote memory, attention span and concentration, language, and fund of knowledge is normal.  Speech is not dysarthric.  CRANIAL NERVES: II:  No visual field defects.   III-IV-VI: Pupils equal round and reactive to light.  Normal conjugate, extra-ocular eye movements in all directions of gaze.  No nystagmus.  No ptosis.   V:  Normal facial sensation.    VII:  Normal facial symmetry and movements.   VIII:  Normal hearing and vestibular function.   IX-X:  Normal palatal movement.   XI:  Normal shoulder shrug and head  rotation.   XII:  Normal tongue strength and range of motion, no deviation or fasciculation.  MOTOR:  No atrophy, fasciculations or abnormal movements.  No pronator drift.  No grip myotonia or percussion myotonia.  Upper Extremity:  Right  Left  Deltoid  5/5   5/5   Biceps  5/5   5/5   Triceps  5/5   5/5   Infraspinatus 5/5  5/5  Medial pectoralis 5/5  5/5  Wrist extensors  5/5   5/5   Wrist flexors  5/5   5/5   Finger extensors  5/5   5/5   Finger flexors  5/5   5/5   Dorsal interossei  5/5   5/5   Abductor pollicis  5/5   5/5   Tone (Ashworth scale)  0  0   Lower Extremity:  Right  Left  Hip flexors  5/5   5/5   Hip extensors  5/5   5/5   Adductor 5/5  5/5  Abductor 5/5  5/5  Knee flexors  5/5   5/5   Knee extensors  5/5   5/5   Dorsiflexors  5/5   5/5   Plantarflexors  5/5   5/5   Toe extensors  5/5   5/5   Toe flexors  5/5   5/5   Tone (Ashworth scale)  0  0   MSRs:  Right        Left                  brachioradialis 2+  2+  biceps 2+  2+  triceps 2+  2+  patellar 2+  2+  ankle jerk 2+  2+  Hoffman no  no  plantar response down  down   SENSORY:  Normal and symmetric perception of light touch, pinprick, vibration, and proprioception.    COORDINATION/GAIT: Normal finger-to- nose-finger and heel-to-shin.  Intact rapid alternating movements bilaterally. Gait is antalgic due to back pain, unassisted and stable.   IMPRESSION: 1. Restless leg syndrome.  He has been on carbidopa-levadopa for a long time as treatment for RLS and on a high dose of this putting him at risk for extrapyramidal side effects.  I would like to transition him on a lower dose and try to use this as a rescue medication while optimizing his ropinirole  - Start ropinirole 0.5mg  at 3pm and 1mg  at 9pm  - Reduce carbidopa-levadopa 48.75/195mg  to three times daily.    - Continue iron supplement  2. Myotonia congenita, stable.  Patient with no signs of weakness or muscle hypertrophy.  He is aware there  is no treatment and care is supportive  - Continue dilantin 100mg  TID for cramps  Return to clinic in 9 months.   Thank you for allowing me to participate in patient's care.  If I can answer any additional questions, I would be pleased to do so.    Sincerely,    Jennica Tagliaferri K. Posey Pronto, DO

## 2019-03-22 DIAGNOSIS — H02831 Dermatochalasis of right upper eyelid: Secondary | ICD-10-CM | POA: Insufficient documentation

## 2019-03-22 DIAGNOSIS — H18232 Secondary corneal edema, left eye: Secondary | ICD-10-CM | POA: Insufficient documentation

## 2019-03-22 DIAGNOSIS — H18332 Rupture in Descemet's membrane, left eye: Secondary | ICD-10-CM | POA: Insufficient documentation

## 2019-03-22 DIAGNOSIS — H2511 Age-related nuclear cataract, right eye: Secondary | ICD-10-CM | POA: Insufficient documentation

## 2019-03-22 HISTORY — DX: Age-related nuclear cataract, right eye: H25.11

## 2019-03-22 HISTORY — DX: Secondary corneal edema, left eye: H18.232

## 2019-03-22 HISTORY — DX: Dermatochalasis of right upper eyelid: H02.831

## 2019-03-22 HISTORY — DX: Rupture in descemet's membrane, left eye: H18.332

## 2019-06-28 ENCOUNTER — Ambulatory Visit (INDEPENDENT_AMBULATORY_CARE_PROVIDER_SITE_OTHER): Payer: No Typology Code available for payment source | Admitting: Urology

## 2019-06-28 ENCOUNTER — Other Ambulatory Visit: Payer: Self-pay

## 2019-06-28 ENCOUNTER — Encounter: Payer: Self-pay | Admitting: Urology

## 2019-06-28 ENCOUNTER — Other Ambulatory Visit: Payer: Self-pay | Admitting: Urology

## 2019-06-28 VITALS — BP 119/76 | HR 65 | Temp 97.2°F | Ht 67.0 in | Wt 185.0 lb

## 2019-06-28 DIAGNOSIS — R972 Elevated prostate specific antigen [PSA]: Secondary | ICD-10-CM

## 2019-06-28 LAB — PSA: PSA: 2.9 ng/mL (ref <=4.0)

## 2019-06-28 NOTE — Progress Notes (Signed)
H&P  Chief Complaint: Elevated PSA  History of Present Illness:   4.20.2021: He presents today referred by PCP for an elevated PSA -- in 1 yr this apparently increased from 2.18 to 4.26 (no records available aside from letter from referring physician with his last 2 PSA values written out). He reports that this is the first time he was told that his PSA was abnormal. He denies any voiding sx's aside from some minor changes in his urinary pattern and stream strength that he largely attributes to aging.   He does have some issues with achieving climax with his anxiety medication but denies any issues with erectile fxn.   Past Medical History:  Diagnosis Date  . Acid reflux   . Anxiety   . Arrhythmia   . Arthritis   . Essential hypertension   . High cholesterol   . History of skin cancer   . Myotonia congenita   . Type 2 diabetes mellitus ([place])     Past Surgical History:  Procedure Laterality Date  . BACK SURGERY     times 2  . CARPAL TUNNEL RELEASE    . CATARACT EXTRACTION W/PHACO Left 01/31/2019   Procedure: CATARACT EXTRACTION PHACO AND INTRAOCULAR LENS PLACEMENT (IOC);  Surgeon: Baruch Goldmann, MD;  Location: [facility];  Service: Ophthalmology;  Laterality: Left;  CDE: 2.66  . NOSE SURGERY    . TOTAL KNEE ARTHROPLASTY Right 10/12/2018   Procedure: RIGHT TOTAL KNEE ARTHROPLASTY;  Surgeon: Garald Balding, MD;  Location: [facility];  Service: Orthopedics;  Laterality: Right;    Home Medications:  Allergies as of 06/28/2019   No Known Allergies     Medication List       Accurate as of June 28, 2019 10:36 AM. If you have any questions, ask your nurse or doctor.        STOP taking these medications   MULTIVITAMIN PO Stopped by: Jorja Loa, MD     TAKE these medications   aspirin 81 MG chewable tablet Chew 1 tablet (81 mg total) by mouth 2 (two) times daily. What changed: when to take this   atorvastatin 80 MG tablet Commonly known as: LIPITOR Take 40 mg by  mouth daily.   b complex vitamins tablet Take 1 tablet by mouth daily.   Carbidopa-Levodopa ER 48.75-195 MG Cpcr Take 1 tablet by mouth 4 (four) times daily.   hydrOXYzine 25 MG tablet Commonly known as: ATARAX/VISTARIL Take 25 mg by mouth 4 (four) times daily as needed for anxiety.   lisinopril-hydrochlorothiazide 20-25 MG tablet Commonly known as: ZESTORETIC Take 1 tablet by mouth daily.   loratadine 10 MG tablet Commonly known as: CLARITIN Take 10 mg by mouth daily.   meloxicam 15 MG tablet Commonly known as: MOBIC Take 15 mg by mouth daily.   metFORMIN 500 MG tablet Commonly known as: GLUCOPHAGE Take 500 mg by mouth 2 (two) times daily with a meal.   metoprolol succinate 25 MG 24 hr tablet Commonly known as: Toprol XL Take 1 tablet (25 mg total) by mouth daily.   Multi-Vitamin tablet Take by mouth.   pantoprazole 40 MG tablet Commonly known as: PROTONIX Take 40 mg by mouth daily.   PARoxetine 20 MG tablet Commonly known as: PAXIL Take 20 mg by mouth daily.   phenytoin 100 MG ER capsule Commonly known as: DILANTIN Take 100 mg by mouth 3 (three) times daily.   Refresh 1.4-0.6 % Soln Generic drug: Polyvinyl Alcohol-Povidone PF Place 1 drop into both  eyes 3 (three) times daily.   rOPINIRole 1 MG tablet Commonly known as: REQUIP Take 0.5mg  at 6pm and 1mg  at 9pm.   sodium chloride 5 % ophthalmic solution Commonly known as: MURO 128 Place 1 drop in right eye once every hour for the first 3 hours upon waking and place 1 drop in right eye at bedtime.   Vitamin D3 125 MCG (5000 UT) Tabs Take 5,000 Units by mouth daily.   zinc gluconate 50 MG tablet Take 50 mg by mouth daily.       Allergies: No Known Allergies  Family History  Problem Relation Age of Onset  . Healthy Mother   . Healthy Father   . Cancer Father     Social History:  reports that he quit smoking about 15 months ago. His smoking use included cigarettes. He has a 30.00 pack-year  smoking history. He has never used smokeless tobacco. He reports current alcohol use. He reports that he does not use drugs.  ROS: A complete review of systems was performed.  All systems are negative except for pertinent findings as noted.  Physical Exam:  Vital signs in last 24 hours: BP 119/76   Pulse 65   Temp (!) 97.2 F (36.2 C)   Ht 5\' 7"  (1.702 m)   Wt 185 lb (83.9 kg)   BMI 28.98 kg/m  Constitutional:  Alert and oriented, No acute distress Cardiovascular: Regular rate  Respiratory: Normal respiratory effort GI: Abdomen is soft, nontender, nondistended, no abdominal masses. No CVAT. No hernias. Genitourinary: Normal male phallus, testes are descended bilaterally and non-tender and without masses, scrotum is normal in appearance without lesions or masses, perineum is normal on inspection. Prostate feels around 40 grams in size. Lymphatic: No lymphadenopathy Neurologic: Grossly intact, no focal deficits Psychiatric: Normal mood and affect   I have reviewed prior pt note  I have reviewed prior PSA results   Impression/Assessment:  PSA nearly doubled over just 1 yr. We do not currently have records for his PSA trend but this is a concerning elevation. DRE today is normal. We will recheck PSA today and plan appropriate follow-up pending results.  Plan:  1. We will try to get records from his PCP regarding his PSA trend.   2. PSA toady -- will forward results  3. Pending results, we may move forward with a TRUSP/bx. If PSA returns to his baseline, he should still continue to have regular PSA screening.

## 2019-06-28 NOTE — Progress Notes (Signed)
Urological Symptom Review  Patient is experiencing the following symptoms: Hard to postpone urination Leakage of urine Erection problems (male only)   Review of Systems  Gastrointestinal (upper)  : Negative for upper GI symptoms  Gastrointestinal (lower) : Constipation  Constitutional : Negative for symptoms  Skin: Negative for skin symptoms  Eyes: Negative for eye symptoms  Ear/Nose/Throat : Negative for Ear/Nose/Throat symptoms  Hematologic/Lymphatic: Negative for Hematologic/Lymphatic symptoms  Cardiovascular : Negative for cardiovascular symptoms  Respiratory : Negative for respiratory symptoms  Endocrine: Negative for endocrine symptoms  Musculoskeletal: Back pain Joint pain  Neurological: Headaches Dizziness  Psychologic: Anxiety

## 2019-07-01 ENCOUNTER — Telehealth: Payer: Self-pay

## 2019-07-01 DIAGNOSIS — R972 Elevated prostate specific antigen [PSA]: Secondary | ICD-10-CM

## 2019-07-01 NOTE — Telephone Encounter (Signed)
-----   Message from Franchot Gallo, MD sent at 07/01/2019 12:53 PM EDT -----  Notify patient that PSA is down to 2.9, good news.  No biopsy needed but I would like to see him back in a year. ----- Message ----- From: Dorisann Frames, RN Sent: 06/29/2019   9:24 AM EDT To: Franchot Gallo, MD  Please review

## 2019-07-01 NOTE — Telephone Encounter (Signed)
Juliann Pulse notified. appt mailed along with psa order.

## 2019-07-11 ENCOUNTER — Other Ambulatory Visit: Payer: Self-pay

## 2019-07-12 NOTE — Progress Notes (Signed)
PSA levels received from [facility] in [place]  8.19.2015--2.14 10.30.2017--2.62 10.29.2018--3.76 9.27.2019--2.16 10.7.2020--4.26

## 2019-09-21 ENCOUNTER — Ambulatory Visit (INDEPENDENT_AMBULATORY_CARE_PROVIDER_SITE_OTHER): Payer: No Typology Code available for payment source | Admitting: Orthopaedic Surgery

## 2019-09-21 ENCOUNTER — Other Ambulatory Visit: Payer: Self-pay

## 2019-09-21 ENCOUNTER — Encounter: Payer: Self-pay | Admitting: Orthopaedic Surgery

## 2019-09-21 VITALS — Ht 65.5 in | Wt 195.0 lb

## 2019-09-21 DIAGNOSIS — Z96651 Presence of right artificial knee joint: Secondary | ICD-10-CM

## 2019-09-21 DIAGNOSIS — G8929 Other chronic pain: Secondary | ICD-10-CM | POA: Diagnosis not present

## 2019-09-21 DIAGNOSIS — M170 Bilateral primary osteoarthritis of knee: Secondary | ICD-10-CM

## 2019-09-21 DIAGNOSIS — M25511 Pain in right shoulder: Secondary | ICD-10-CM

## 2019-09-21 DIAGNOSIS — M1712 Unilateral primary osteoarthritis, left knee: Secondary | ICD-10-CM

## 2019-09-21 HISTORY — DX: Pain in right shoulder: M25.511

## 2019-09-21 MED ORDER — BUPIVACAINE HCL 0.5 % IJ SOLN
2.0000 mL | INTRAMUSCULAR | Status: AC | PRN
  Administered 2019-09-21: 2 mL via INTRA_ARTICULAR

## 2019-09-21 MED ORDER — LIDOCAINE HCL 2 % IJ SOLN
2.0000 mL | INTRAMUSCULAR | Status: AC | PRN
  Administered 2019-09-21: 2 mL

## 2019-09-21 MED ORDER — LIDOCAINE HCL 1 % IJ SOLN
2.0000 mL | INTRAMUSCULAR | Status: AC | PRN
  Administered 2019-09-21: 2 mL

## 2019-09-21 MED ORDER — METHYLPREDNISOLONE ACETATE 40 MG/ML IJ SUSP
80.0000 mg | INTRAMUSCULAR | Status: AC | PRN
  Administered 2019-09-21: 80 mg via INTRA_ARTICULAR
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
## 2019-10-05 ENCOUNTER — Telehealth: Payer: Self-pay | Admitting: *Deleted

## 2019-10-05 NOTE — Telephone Encounter (Signed)
Received information from [place] adminstration for authorization of [id number], valid 09/14/19-03/19/20,  to have MRI Right shoulder, pt is scheduled at [facility] on aug 5 at 4:45pm, pt is aware of appt.

## 2019-11-02 ENCOUNTER — Other Ambulatory Visit: Payer: Self-pay

## 2019-11-02 ENCOUNTER — Encounter: Payer: Self-pay | Admitting: Orthopaedic Surgery

## 2019-11-02 ENCOUNTER — Ambulatory Visit (INDEPENDENT_AMBULATORY_CARE_PROVIDER_SITE_OTHER): Payer: No Typology Code available for payment source | Admitting: Orthopaedic Surgery

## 2019-11-02 VITALS — Ht 65.5 in | Wt 195.0 lb

## 2019-11-02 DIAGNOSIS — M25511 Pain in right shoulder: Secondary | ICD-10-CM | POA: Diagnosis not present

## 2019-11-02 DIAGNOSIS — G8929 Other chronic pain: Secondary | ICD-10-CM

## 2019-11-02 NOTE — Progress Notes (Signed)
Office Visit Note   Patient: Danny Hampton           Date of Birth: 05-24-1955           MRN: [id number] Visit Date: 11/02/2019              Requested by: Alda Berthold, DO [address] [place],  [place] [address] PCP: Alda Berthold, DO   Assessment & Plan: Visit Diagnoses:  1. Chronic right shoulder pain     Plan: Mr. Lukasik had an MRI scan of his right shoulder performed in [place].  I have a copy of the report demonstrating tendinopathy of the infraspinatus tendon at its insertion site and the greater tuberosity.  There was also impingement on the supraspinatus musculotendinous junction related to degenerative arthritis at the [facility] joint.  No significant supraspinatus tendon inflammation or tear.  Mild to moderate degenerative arthritis of the glenohumeral joint.  Tiny SLAP lesion and a completely torn and retracted biceps tendon.  Discussed all of the above with Mr. Rossitto.  He notes that the cortisone injection performed about a month ago did help.  He would like to try a course of physical therapy and Voltaren gel and will check him back over the next several months.  Follow-Up Instructions: Return if symptoms worsen or fail to improve.   Orders:  Orders Placed This Encounter  Procedures  . Ambulatory referral to Physical Therapy   No orders of the defined types were placed in this encounter.     Procedures: No procedures performed   Clinical Data: No additional findings.   Subjective: Chief Complaint  Patient presents with  . Right Shoulder - Follow-up    MRI review  Patient presents today for follow up on his right shoulder. He had an MRI on 10/21/2019 and is here today for those results. He states that he did get some relief from his cortisone injection at his last visit on 09/21/2019. He is taking Tylenol for pain.   HPI  Review of Systems   Objective: Vital Signs: Ht 5' 5.5" (1.664 m)   Wt 195 lb (88.5 kg)   BMI 31.96 kg/m    Physical Exam Constitutional:      Appearance: He is well-developed.  Eyes:     Pupils: Pupils are equal, round, and reactive to light.  Pulmonary:     Effort: Pulmonary effort is normal.  Skin:    General: Skin is warm and dry.  Neurological:     Mental Status: He is alert and oriented to person, place, and time.  Psychiatric:        Behavior: Behavior normal.     Ortho Exam awake alert and oriented x3.  Comfortable sitting.  Mr. Lippmann was able to place his right arm fully overhead with some mild discomfort.  Had mild impingement testing and some mild discomfort beneath the anterior subacromial region.  No pain at the [facility] joint.  Good grip and good relief and good strength.  Minimally positive empty can testing.  MRI scan demonstrated a tear of the long head of the biceps tendon.  There appeared to be some distal position of the biceps muscle but no ecchymosis or other skin changes. Specialty Comments:  No specialty comments available.  Imaging: No results found.   PMFS History: Patient Active Problem List   Diagnosis Date Noted  . Pain in right shoulder 09/21/2019  . Total knee replacement status, right 10/12/2018  . Unilateral primary osteoarthritis,  right knee 09/30/2018  . Cervicalgia 04/21/2018  . Bilateral primary osteoarthritis of knee 01/03/2016   Past Medical History:  Diagnosis Date  . Acid reflux   . Anxiety   . Arrhythmia   . Arthritis   . Essential hypertension   . High cholesterol   . History of skin cancer   . Myotonia congenita   . Type 2 diabetes mellitus ([facility])     Family History  Problem Relation Age of Onset  . Healthy Mother   . Healthy Father   . Cancer Father     Past Surgical History:  Procedure Laterality Date  . BACK SURGERY     times 2  . CARPAL TUNNEL RELEASE    . CATARACT EXTRACTION W/PHACO Left 01/31/2019   Procedure: CATARACT EXTRACTION PHACO AND INTRAOCULAR LENS PLACEMENT (IOC);  Surgeon: Baruch Goldmann, MD;  Location: [facility];  Service: Ophthalmology;  Laterality: Left;  CDE: 2.66  . NOSE SURGERY    . TOTAL KNEE ARTHROPLASTY Right 10/12/2018   Procedure: RIGHT TOTAL KNEE ARTHROPLASTY;  Surgeon: Garald Balding, MD;  Location: [facility];  Service: Orthopedics;  Laterality: Right;   Social History   Occupational History  . Occupation: retired Clinical biochemist  Tobacco Use  . Smoking status: Former Smoker    Packs/day: 1.00    Years: 30.00    Pack years: 30.00    Types: Cigarettes    Quit date: 03/2018    Years since quitting: 1.6  . Smokeless tobacco: Never Used  Vaping Use  . Vaping Use: Never used  Substance and Sexual Activity  . Alcohol use: Yes    Comment: occasional drink  . Drug use: No  . Sexual activity: Not on file

## 2019-12-01 ENCOUNTER — Ambulatory Visit: Payer: No Typology Code available for payment source | Admitting: Neurology

## 2020-02-22 ENCOUNTER — Other Ambulatory Visit: Payer: Self-pay

## 2020-02-22 ENCOUNTER — Ambulatory Visit (INDEPENDENT_AMBULATORY_CARE_PROVIDER_SITE_OTHER): Payer: No Typology Code available for payment source | Admitting: Orthopaedic Surgery

## 2020-02-22 ENCOUNTER — Encounter: Payer: Self-pay | Admitting: Orthopaedic Surgery

## 2020-02-22 ENCOUNTER — Ambulatory Visit (INDEPENDENT_AMBULATORY_CARE_PROVIDER_SITE_OTHER): Payer: No Typology Code available for payment source

## 2020-02-22 VITALS — Ht 65.5 in | Wt 195.0 lb

## 2020-02-22 DIAGNOSIS — M25511 Pain in right shoulder: Secondary | ICD-10-CM | POA: Diagnosis not present

## 2020-02-22 DIAGNOSIS — M25562 Pain in left knee: Secondary | ICD-10-CM

## 2020-02-22 DIAGNOSIS — G8929 Other chronic pain: Secondary | ICD-10-CM

## 2020-02-22 DIAGNOSIS — M170 Bilateral primary osteoarthritis of knee: Secondary | ICD-10-CM

## 2020-02-22 MED ORDER — BUPIVACAINE HCL 0.5 % IJ SOLN
2.0000 mL | INTRAMUSCULAR | Status: AC | PRN
  Administered 2020-02-22: 2 mL via INTRA_ARTICULAR

## 2020-02-22 MED ORDER — LIDOCAINE HCL 1 % IJ SOLN
2.0000 mL | INTRAMUSCULAR | Status: AC | PRN
  Administered 2020-02-22: 2 mL

## 2020-02-22 MED ORDER — LIDOCAINE HCL 2 % IJ SOLN
2.0000 mL | INTRAMUSCULAR | Status: AC | PRN
  Administered 2020-02-22: 2 mL

## 2020-02-22 NOTE — Progress Notes (Signed)
Office Visit Note   Patient: Danny Hampton           Date of Birth: 1955-10-09           MRN: [id number] Visit Date: 02/22/2020              Requested by: Alda Berthold, DO [place] [address],  [place] [id number] PCP: Alda Berthold, DO   Assessment & Plan: Visit Diagnoses:  1. Chronic pain of left knee   2. Bilateral primary osteoarthritis of knee   3. Chronic right shoulder pain     Plan: Mr. Gilkeson has evidence of osteoarthritis of the medial compartment of his left knee. He has had a prior successful right total knee replacement. Will inject the left knee with cortisone.  In addition he is having recurrent impingement symptoms of his right shoulder. He has had prior subacromial cortisone injections with good relief. Prior MRI scan demonstrated rotator cuff tendinitis and AC joint arthritis. Biceps tendon has been completely torn. Will reinject the subacromial space  Follow-Up Instructions: Return if symptoms worsen or fail to improve.   Orders:  Orders Placed This Encounter  Procedures  . XR KNEE 3 VIEW LEFT   No orders of the defined types were placed in this encounter.     Procedures: Large Joint Inj: L knee on 02/22/2020 11:32 AM Indications: pain and diagnostic evaluation Details: 25 G 1.5 in needle, anteromedial approach  Arthrogram: No  Medications: 2 mL lidocaine 1 %; 2 mL bupivacaine 0.5 %  2 mL betamethasone injected with Xylocaine and Marcaine Procedure, treatment alternatives, risks and benefits explained, specific risks discussed. Consent was given by the patient. Patient was prepped and draped in the usual sterile fashion.   Large Joint Inj: R subacromial bursa on 02/22/2020 11:32 AM Indications: pain and diagnostic evaluation Details: 25 G 1.5 in needle, anterolateral approach  Arthrogram: No  Medications: 2 mL lidocaine 2 %; 2 mL bupivacaine 0.5 %  2 mL betamethasone injected the subacromial space right shoulder  with Xylocaine and Marcaine Consent was given by the patient. Immediately prior to procedure a time out was called to verify the correct patient, procedure, equipment, support staff and site/side marked as required. Patient was prepped and draped in the usual sterile fashion.       Clinical Data: No additional findings.   Subjective: Chief Complaint  Patient presents with  . Right Shoulder - Pain  . Left Knee - Pain  Patient presents today for right shoulder and left knee pain. He states that both are a pain level 4. He wants to get cortisone injections in both joints. He takes over the counter pain medicine and that helps some.   HPI  Review of Systems   Objective: Vital Signs: Ht 5' 5.5" (1.664 m)   Wt 195 lb (88.5 kg)   BMI 31.96 kg/m   Physical Exam Constitutional:      Appearance: He is well-developed and well-nourished.  HENT:     Mouth/Throat:     Mouth: Oropharynx is clear and moist.  Eyes:     Extraocular Movements: EOM normal.     Pupils: Pupils are equal, round, and reactive to light.  Pulmonary:     Effort: Pulmonary effort is normal.  Skin:    General: Skin is warm and dry.  Neurological:     Mental Status: He is alert and oriented to person, place, and time.  Psychiatric:  Mood and Affect: Mood and affect normal.        Behavior: Behavior normal.     Ortho Exam left knee was not hot red warm or swollen. Did have some pain diffusely along the medial compartment but was relatively mild. Some patella crepitation but no pain with patella compression. Full extension and flexed over 100 degrees without instability. No distal edema.  Able to place right arm fully overhead comfortably. Does have some positive impingement. Appears to have good strength. Some tenderness along the [facility] joint and the anterior subacromial region  Specialty Comments:  No specialty comments available.  Imaging: XR KNEE 3 VIEW LEFT  Result Date: 02/22/2020 Films of the  left knee were obtained in several projections standing. There is some mild narrowing of the medial joint space but alignment appears to be neutral. No ectopic calcification or acute changes there is some mild subchondral sclerosis in the medial tibia. No significant osteophytes. Films are consistent with mild to moderate osteoarthritis pre dominantly affecting the medial compartment    PMFS History: Patient Active Problem List   Diagnosis Date Noted  . Pain in right shoulder 09/21/2019  . Total knee replacement status, right 10/12/2018  . Unilateral primary osteoarthritis, right knee 09/30/2018  . Cervicalgia 04/21/2018  . Bilateral primary osteoarthritis of knee 01/03/2016   Past Medical History:  Diagnosis Date  . Acid reflux   . Anxiety   . Arrhythmia   . Arthritis   . Essential hypertension   . High cholesterol   . History of skin cancer   . Myotonia congenita   . Type 2 diabetes mellitus (HCC)     Family History  Problem Relation Age of Onset  . Healthy Mother   . Healthy Father   . Cancer Father     Past Surgical History:  Procedure Laterality Date  . BACK SURGERY     times 2  . CARPAL TUNNEL RELEASE    . CATARACT EXTRACTION W/PHACO Left 01/31/2019   Procedure: CATARACT EXTRACTION PHACO AND INTRAOCULAR LENS PLACEMENT (IOC);  Surgeon: Baruch Goldmann, MD;  Location: [facility];  Service: Ophthalmology;  Laterality: Left;  CDE: 2.66  . NOSE SURGERY    . TOTAL KNEE ARTHROPLASTY Right 10/12/2018   Procedure: RIGHT TOTAL KNEE ARTHROPLASTY;  Surgeon: Garald Balding, MD;  Location: [facility];  Service: Orthopedics;  Laterality: Right;   Social History   Occupational History  . Occupation: retired Clinical biochemist  Tobacco Use  . Smoking status: Former Smoker    Packs/day: 1.00    Years: 30.00    Pack years: 30.00    Types: Cigarettes    Quit date: 03/2018    Years since quitting: 1.9  . Smokeless tobacco: Never Used  Vaping Use  . Vaping Use: Never used  Substance and  Sexual Activity  . Alcohol use: Yes    Comment: occasional drink  . Drug use: No  . Sexual activity: Not on file

## 2020-04-20 ENCOUNTER — Inpatient Hospital Stay (HOSPITAL_COMMUNITY)
Admission: EM | Admit: 2020-04-20 | Discharge: 2020-04-26 | DRG: 177 | Disposition: A | Discharge status: Home / Self Care | Payer: No Typology Code available for payment source | Attending: Internal Medicine | Admitting: Internal Medicine

## 2020-04-20 ENCOUNTER — Encounter (HOSPITAL_COMMUNITY): Payer: Self-pay

## 2020-04-20 ENCOUNTER — Other Ambulatory Visit: Payer: Self-pay

## 2020-04-20 ENCOUNTER — Emergency Department (HOSPITAL_COMMUNITY): Payer: No Typology Code available for payment source

## 2020-04-20 DIAGNOSIS — E871 Hypo-osmolality and hyponatremia: Secondary | ICD-10-CM | POA: Diagnosis present

## 2020-04-20 DIAGNOSIS — E8809 Other disorders of plasma-protein metabolism, not elsewhere classified: Secondary | ICD-10-CM | POA: Diagnosis present

## 2020-04-20 DIAGNOSIS — F32A Depression, unspecified: Secondary | ICD-10-CM | POA: Diagnosis present

## 2020-04-20 DIAGNOSIS — I10 Essential (primary) hypertension: Secondary | ICD-10-CM | POA: Diagnosis present

## 2020-04-20 DIAGNOSIS — F419 Anxiety disorder, unspecified: Secondary | ICD-10-CM | POA: Diagnosis present

## 2020-04-20 DIAGNOSIS — Z79899 Other long term (current) drug therapy: Secondary | ICD-10-CM | POA: Diagnosis not present

## 2020-04-20 DIAGNOSIS — Z791 Long term (current) use of non-steroidal anti-inflammatories (NSAID): Secondary | ICD-10-CM

## 2020-04-20 DIAGNOSIS — K219 Gastro-esophageal reflux disease without esophagitis: Secondary | ICD-10-CM | POA: Diagnosis present

## 2020-04-20 DIAGNOSIS — E669 Obesity, unspecified: Secondary | ICD-10-CM | POA: Diagnosis present

## 2020-04-20 DIAGNOSIS — Z87891 Personal history of nicotine dependence: Secondary | ICD-10-CM

## 2020-04-20 DIAGNOSIS — J9601 Acute respiratory failure with hypoxia: Secondary | ICD-10-CM | POA: Diagnosis present

## 2020-04-20 DIAGNOSIS — Z85828 Personal history of other malignant neoplasm of skin: Secondary | ICD-10-CM | POA: Diagnosis not present

## 2020-04-20 DIAGNOSIS — U071 COVID-19: Principal | ICD-10-CM

## 2020-04-20 DIAGNOSIS — E869 Volume depletion, unspecified: Secondary | ICD-10-CM | POA: Diagnosis present

## 2020-04-20 DIAGNOSIS — G7112 Myotonia congenita: Secondary | ICD-10-CM | POA: Diagnosis present

## 2020-04-20 DIAGNOSIS — E1165 Type 2 diabetes mellitus with hyperglycemia: Secondary | ICD-10-CM

## 2020-04-20 DIAGNOSIS — E7800 Pure hypercholesterolemia, unspecified: Secondary | ICD-10-CM | POA: Diagnosis present

## 2020-04-20 DIAGNOSIS — Z66 Do not resuscitate: Secondary | ICD-10-CM | POA: Diagnosis present

## 2020-04-20 DIAGNOSIS — Z6833 Body mass index (BMI) 33.0-33.9, adult: Secondary | ICD-10-CM

## 2020-04-20 DIAGNOSIS — E785 Hyperlipidemia, unspecified: Secondary | ICD-10-CM

## 2020-04-20 DIAGNOSIS — J1282 Pneumonia due to coronavirus disease 2019: Secondary | ICD-10-CM | POA: Diagnosis present

## 2020-04-20 DIAGNOSIS — Z7982 Long term (current) use of aspirin: Secondary | ICD-10-CM

## 2020-04-20 DIAGNOSIS — Z7984 Long term (current) use of oral hypoglycemic drugs: Secondary | ICD-10-CM | POA: Diagnosis not present

## 2020-04-20 DIAGNOSIS — J9600 Acute respiratory failure, unspecified whether with hypoxia or hypercapnia: Secondary | ICD-10-CM | POA: Diagnosis not present

## 2020-04-20 DIAGNOSIS — G2581 Restless legs syndrome: Secondary | ICD-10-CM | POA: Diagnosis present

## 2020-04-20 DIAGNOSIS — D6489 Other specified anemias: Secondary | ICD-10-CM | POA: Diagnosis present

## 2020-04-20 LAB — CBC WITH DIFFERENTIAL/PLATELET
Abs Immature Granulocytes: 0.05 K/uL (ref 0.00–0.07)
Basophils Absolute: 0 K/uL (ref 0.0–0.1)
Basophils Relative: 1 %
Eosinophils Absolute: 0.2 K/uL (ref 0.0–0.5)
Eosinophils Relative: 2 %
HCT: 32.2 % — ABNORMAL LOW (ref 39.0–52.0)
Hemoglobin: 11.5 g/dL — ABNORMAL LOW (ref 13.0–17.0)
Immature Granulocytes: 1 %
Lymphocytes Relative: 12 %
Lymphs Abs: 0.9 K/uL (ref 0.7–4.0)
MCH: 31.2 pg (ref 26.0–34.0)
MCHC: 35.7 g/dL (ref 30.0–36.0)
MCV: 87.3 fL (ref 80.0–100.0)
Monocytes Absolute: 0.8 K/uL (ref 0.1–1.0)
Monocytes Relative: 11 %
Neutro Abs: 5.1 K/uL (ref 1.7–7.7)
Neutrophils Relative %: 73 %
Platelets: 224 K/uL (ref 150–400)
RBC: 3.69 MIL/uL — ABNORMAL LOW (ref 4.22–5.81)
RDW: 12.1 % (ref 11.5–15.5)
WBC: 7 K/uL (ref 4.0–10.5)
nRBC: 0 % (ref 0.0–0.2)

## 2020-04-20 LAB — RESP PANEL BY RT-PCR (FLU A&B, COVID) ARPGX2
Influenza A by PCR: NEGATIVE
Influenza B by PCR: NEGATIVE
SARS Coronavirus 2 by RT PCR: POSITIVE — AB

## 2020-04-20 LAB — D-DIMER, QUANTITATIVE: D-Dimer, Quant: 1.37 ug/mL-FEU — ABNORMAL HIGH (ref 0.00–0.50)

## 2020-04-20 LAB — LACTATE DEHYDROGENASE: LDH: 227 U/L — ABNORMAL HIGH (ref 98–192)

## 2020-04-20 LAB — PROCALCITONIN: Procalcitonin: 0.16 ng/mL

## 2020-04-20 LAB — COMPREHENSIVE METABOLIC PANEL WITH GFR
ALT: 8 U/L (ref 0–44)
AST: 62 U/L — ABNORMAL HIGH (ref 15–41)
Albumin: 3.4 g/dL — ABNORMAL LOW (ref 3.5–5.0)
Alkaline Phosphatase: 76 U/L (ref 38–126)
Anion gap: 13 (ref 5–15)
BUN: 13 mg/dL (ref 8–23)
CO2: 28 mmol/L (ref 22–32)
Calcium: 9.2 mg/dL (ref 8.9–10.3)
Chloride: 86 mmol/L — ABNORMAL LOW (ref 98–111)
Creatinine, Ser: 0.74 mg/dL (ref 0.61–1.24)
GFR, Estimated: >60 mL/min (ref >60)
Glucose, Bld: 133 mg/dL — ABNORMAL HIGH (ref 70–99)
Potassium: 4.3 mmol/L (ref 3.5–5.1)
Sodium: 127 mmol/L — ABNORMAL LOW (ref 135–145)
Total Bilirubin: 0.8 mg/dL (ref 0.3–1.2)
Total Protein: 7.1 g/dL (ref 6.5–8.1)

## 2020-04-20 LAB — TRIGLYCERIDES: Triglycerides: 249 mg/dL — ABNORMAL HIGH (ref <150)

## 2020-04-20 LAB — CBG MONITORING, ED: Glucose-Capillary: 136 mg/dL — ABNORMAL HIGH (ref 70–99)

## 2020-04-20 LAB — FIBRINOGEN: Fibrinogen: >800 mg/dL — ABNORMAL HIGH (ref 210–475)

## 2020-04-20 LAB — LACTIC ACID, PLASMA: Lactic Acid, Venous: 1 mmol/L (ref 0.5–1.9)

## 2020-04-20 MED ORDER — ACETAMINOPHEN 325 MG PO TABS
650.0000 mg | ORAL_TABLET | Freq: Four times a day (QID) | ORAL | Status: DC | PRN
  Administered 2020-04-21 – 2020-04-26 (×13): 650 mg via ORAL
  Filled 2020-04-20 (×15): qty 2

## 2020-04-20 MED ORDER — ENOXAPARIN SODIUM 40 MG/0.4ML ~~LOC~~ SOLN
40.0000 mg | Freq: Every day | SUBCUTANEOUS | Status: DC
  Administered 2020-04-20 – 2020-04-25 (×6): 40 mg via SUBCUTANEOUS
  Filled 2020-04-20 (×6): qty 0.4

## 2020-04-20 MED ORDER — PREDNISONE 50 MG PO TABS: 50.0000 mg | ORAL_TABLET | Freq: Every day | ORAL | Status: DC

## 2020-04-20 MED ORDER — SODIUM CHLORIDE 0.9 % IV BOLUS
250.0000 mL | Freq: Once | INTRAVENOUS | Status: AC
  Administered 2020-04-20: 250 mL via INTRAVENOUS

## 2020-04-20 MED ORDER — SODIUM CHLORIDE 0.9 % IV SOLN
100.0000 mg | Freq: Once | INTRAVENOUS | Status: AC
  Administered 2020-04-20: 100 mg via INTRAVENOUS
  Filled 2020-04-20: qty 20

## 2020-04-20 MED ORDER — HYDROCOD POLST-CPM POLST ER 10-8 MG/5ML PO SUER
5.0000 mL | Freq: Two times a day (BID) | ORAL | Status: DC | PRN
Start: 2020-04-20 — End: 2020-04-26

## 2020-04-20 MED ORDER — ASCORBIC ACID 500 MG PO TABS
500.0000 mg | ORAL_TABLET | Freq: Every day | ORAL | Status: DC
  Administered 2020-04-21 – 2020-04-26 (×6): 500 mg via ORAL
  Filled 2020-04-20 (×6): qty 1

## 2020-04-20 MED ORDER — ZINC SULFATE 220 (50 ZN) MG PO CAPS
220.0000 mg | ORAL_CAPSULE | Freq: Every day | ORAL | Status: DC
  Administered 2020-04-21 – 2020-04-26 (×6): 220 mg via ORAL
  Filled 2020-04-20 (×6): qty 1

## 2020-04-20 MED ORDER — SODIUM CHLORIDE 0.9 % IV SOLN
100.0000 mg | Freq: Every day | INTRAVENOUS | Status: AC
  Administered 2020-04-21 – 2020-04-24 (×4): 100 mg via INTRAVENOUS
  Filled 2020-04-20 (×4): qty 20

## 2020-04-20 MED ORDER — SODIUM CHLORIDE 0.9 % IV SOLN: 200.0000 mg | Freq: Once | INTRAVENOUS | Status: DC

## 2020-04-20 MED ORDER — SODIUM CHLORIDE 0.9 % IV SOLN: 100.0000 mg | Freq: Every day | INTRAVENOUS | Status: DC

## 2020-04-20 MED ORDER — INSULIN ASPART 100 UNIT/ML ~~LOC~~ SOLN
0.0000 [IU] | Freq: Three times a day (TID) | SUBCUTANEOUS | Status: DC
  Administered 2020-04-21: 5 [IU] via SUBCUTANEOUS
  Filled 2020-04-20: qty 1

## 2020-04-20 MED ORDER — METHYLPREDNISOLONE SODIUM SUCC 125 MG IJ SOLR
0.5000 mg/kg | Freq: Two times a day (BID) | INTRAMUSCULAR | Status: DC
  Administered 2020-04-20: 45.625 mg via INTRAVENOUS
  Filled 2020-04-20: qty 2

## 2020-04-20 MED ORDER — PANTOPRAZOLE SODIUM 40 MG IV SOLR
40.0000 mg | Freq: Every day | INTRAVENOUS | Status: DC
  Administered 2020-04-20 – 2020-04-24 (×5): 40 mg via INTRAVENOUS
  Filled 2020-04-20 (×5): qty 40

## 2020-04-20 MED ORDER — INSULIN ASPART 100 UNIT/ML ~~LOC~~ SOLN: 0.0000 [IU] | Freq: Every day | SUBCUTANEOUS | Status: DC

## 2020-04-20 MED ORDER — GUAIFENESIN-DM 100-10 MG/5ML PO SYRP
10.0000 mL | ORAL_SOLUTION | ORAL | Status: DC | PRN
  Administered 2020-04-23: 10 mL via ORAL
  Filled 2020-04-20: qty 10

## 2020-04-20 MED ORDER — ALBUTEROL SULFATE HFA 108 (90 BASE) MCG/ACT IN AERS
2.0000 | INHALATION_SPRAY | Freq: Four times a day (QID) | RESPIRATORY_TRACT | Status: DC
  Administered 2020-04-20 – 2020-04-21 (×5): 2 via RESPIRATORY_TRACT
  Filled 2020-04-20: qty 6.7

## 2020-04-20 MED ORDER — DEXAMETHASONE SODIUM PHOSPHATE 10 MG/ML IJ SOLN
8.0000 mg | Freq: Once | INTRAMUSCULAR | Status: AC
  Administered 2020-04-20: 8 mg via INTRAVENOUS
  Filled 2020-04-20: qty 1

## 2020-04-20 NOTE — ED Notes (Signed)
Patient given inhaler albuterol by nurse. Patient started on incentive and flutter device by RT. Patient on 2 liters increased 3 liter. Patient has hx of smoking. Did not take COVID vaccine. Left side pneumonia greater.

## 2020-04-20 NOTE — ED Provider Notes (Signed)
[facility] EMERGENCY DEPARTMENT Provider Note   CSN: [id number] Arrival date & time: 04/20/20  1839     History No chief complaint on file.   Danny Hampton is a 65 y.o. male past medical history of type 2 diabetes, hypertension, hyperlipidemia, presenting to the emergency department via EMS for shortness of breath and generalized weakness.  Patient states symptoms began 9 days ago.  He endorses headache daily, generalized fatigue, cough.  He developed shortness of breath on Monday which has been gradually worsening.  He is not having vomiting or diarrhea.  He states this morning he could not stay awake and went back to bed.  He woke up at 4 this evening and felt worse.  EMS noted O2 sat of 80% on room air, placed on 3 L satting at 94 to 95% on evaluation. No known Covid exposures.  Has not had COVID-19 vaccines  The history is provided by the patient.       Past Medical History:  Diagnosis Date  . Acid reflux   . Anxiety   . Arrhythmia   . Arthritis   . Essential hypertension   . High cholesterol   . History of skin cancer   . Myotonia congenita   . Type 2 diabetes mellitus [facility])     Patient Active Problem List   Diagnosis Date Noted  . Pneumonia due to COVID-19 virus 04/20/2020  . Pain in right shoulder 09/21/2019  . Total knee replacement status, right 10/12/2018  . Unilateral primary osteoarthritis, right knee 09/30/2018  . Cervicalgia 04/21/2018  . Bilateral primary osteoarthritis of knee 01/03/2016    Past Surgical History:  Procedure Laterality Date  . BACK SURGERY     times 2  . CARPAL TUNNEL RELEASE    . CATARACT EXTRACTION W/PHACO Left 01/31/2019   Procedure: CATARACT EXTRACTION PHACO AND INTRAOCULAR LENS PLACEMENT (IOC);  Surgeon: Baruch Goldmann, MD;  Location: [facility];  Service: Ophthalmology;  Laterality: Left;  CDE: 2.66  . NOSE SURGERY    . TOTAL KNEE ARTHROPLASTY Right 10/12/2018   Procedure: RIGHT TOTAL KNEE ARTHROPLASTY;  Surgeon: Garald Balding, MD;  Location: [facility];  Service: Orthopedics;  Laterality: Right;       Family History  Problem Relation Age of Onset  . Healthy Mother   . Healthy Father   . Cancer Father     Social History   Tobacco Use  . Smoking status: Former Smoker    Packs/day: 1.00    Years: 30.00    Pack years: 30.00    Types: Cigarettes    Quit date: 03/2018    Years since quitting: 2.1  . Smokeless tobacco: Never Used  Vaping Use  . Vaping Use: Never used  Substance Use Topics  . Alcohol use: Yes    Comment: occasional drink  . Drug use: No    Home Medications Prior to Admission medications   Medication Sig Start Date End Date Taking? Authorizing Provider  aspirin 81 MG chewable tablet Chew 1 tablet (81 mg total) by mouth 2 (two) times daily. Patient taking differently: Chew 81 mg by mouth daily. 10/13/18   Cherylann Ratel, PA-C  atorvastatin (LIPITOR) 80 MG tablet Take 40 mg by mouth daily.     [provider]  b complex vitamins tablet Take 1 tablet by mouth daily.    [provider]  Carbidopa-Levodopa ER 48.75-195 MG CPCR Take 1 tablet by mouth 4 (four) times daily.    [provider]  Cholecalciferol (VITAMIN D3) 125 MCG (5000 UT) TABS Take 5,000 Units by mouth daily.     [provider]  hydrOXYzine (ATARAX/VISTARIL) 25 MG tablet Take 25 mg by mouth 4 (four) times daily as needed for anxiety.     [provider]  lisinopril-hydrochlorothiazide (ZESTORETIC) 20-25 MG tablet Take 1 tablet by mouth daily.    [provider]  loratadine (CLARITIN) 10 MG tablet Take 10 mg by mouth daily.    [provider]  meloxicam (MOBIC) 15 MG tablet Take 15 mg by mouth daily.    [provider]  metFORMIN (GLUCOPHAGE) 500 MG tablet Take 500 mg by mouth 2 (two) times daily with a meal.     [provider]  metoprolol succinate (TOPROL XL) 25 MG 24 hr tablet Take 1 tablet (25 mg total) by mouth daily. 02/01/19    Satira Sark, MD  Multiple Vitamin (MULTI-VITAMIN) tablet Take by mouth.    [provider]  pantoprazole (PROTONIX) 40 MG tablet Take 40 mg by mouth daily.    [provider]  PARoxetine (PAXIL) 20 MG tablet Take 20 mg by mouth daily.    [provider]  phenytoin (DILANTIN) 100 MG ER capsule Take 100 mg by mouth 3 (three) times daily.     [provider]  Polyvinyl Alcohol-Povidone PF (REFRESH) 1.4-0.6 % SOLN Place 1 drop into both eyes 3 (three) times daily.    [provider]  rOPINIRole (REQUIP) 1 MG tablet Take 0.5mg  at 6pm and 1mg  at 9pm. 03/21/19   Patel, Arvin Collard K, DO  sodium chloride (MURO 128) 5 % ophthalmic solution Place 1 drop in right eye once every hour for the first 3 hours upon waking and place 1 drop in right eye at bedtime.    [provider]  zinc gluconate 50 MG tablet Take 50 mg by mouth daily.    [provider]    Allergies    Patient has no known allergies.  Review of Systems   Review of Systems  All other systems reviewed and are negative.   Physical Exam Updated Vital Signs BP 133/88   Pulse 93   Temp 98.8 F (37.1 C) (Oral)   Resp 18   Ht 5\' 5"  (1.651 m)   Wt 90.7 kg   SpO2 91%   BMI 33.28 kg/m   Physical Exam Vitals and nursing note reviewed.  Constitutional:      Appearance: He is well-developed and well-nourished.  HENT:     Head: Normocephalic and atraumatic.  Eyes:     Conjunctiva/sclera: Conjunctivae normal.  Cardiovascular:     Rate and Rhythm: Normal rate and regular rhythm.  Pulmonary:     Effort: Pulmonary effort is normal.     Comments: Faint rales bilateral bases (right>left) O2 sat 94 to 95% on 3 L nasal cannula. Abdominal:     General: Bowel sounds are normal.     Palpations: Abdomen is soft.     Tenderness: There is no abdominal tenderness.  Musculoskeletal:     Right lower leg: No edema.     Left lower leg: No edema.  Skin:    General: Skin is warm.   Neurological:     Mental Status: He is alert.  Psychiatric:        Mood and Affect: Mood and affect normal.        Behavior: Behavior normal.     ED Results / Procedures / Treatments   Labs (all labs  ordered are listed, but only abnormal results are displayed) Labs Reviewed  RESP PANEL BY RT-PCR (FLU A&B, COVID) ARPGX2 - Abnormal; Notable for the following components:      Result Value   SARS Coronavirus 2 by RT PCR POSITIVE (*)    All other components within normal limits  COMPREHENSIVE METABOLIC PANEL - Abnormal; Notable for the following components:   Sodium 127 (*)    Chloride 86 (*)    Glucose, Bld 133 (*)    Albumin 3.4 (*)    AST 62 (*)    All other components within normal limits  CBC WITH DIFFERENTIAL/PLATELET - Abnormal; Notable for the following components:   RBC 3.69 (*)    Hemoglobin 11.5 (*)    HCT 32.2 (*)    All other components within normal limits  D-DIMER, QUANTITATIVE (NOT AT [facility]) - Abnormal; Notable for the following components:   D-Dimer, Quant 1.37 (*)    All other components within normal limits  LACTATE DEHYDROGENASE - Abnormal; Notable for the following components:   LDH 227 (*)    All other components within normal limits  TRIGLYCERIDES - Abnormal; Notable for the following components:   Triglycerides 249 (*)    All other components within normal limits  FIBRINOGEN - Abnormal; Notable for the following components:   Fibrinogen >800 (*)    All other components within normal limits  CBG MONITORING, ED - Abnormal; Notable for the following components:   Glucose-Capillary 136 (*)    All other components within normal limits  LACTIC ACID, PLASMA  LACTIC ACID, PLASMA  PROCALCITONIN  FERRITIN  C-REACTIVE PROTEIN  HIV ANTIBODY (ROUTINE TESTING W REFLEX)  CBC WITH DIFFERENTIAL/PLATELET  COMPREHENSIVE METABOLIC PANEL  C-REACTIVE PROTEIN  D-DIMER, QUANTITATIVE (NOT AT [facility])  FERRITIN  MAGNESIUM  PHOSPHORUS    EKG None  Radiology DG  Chest Port 1 View  Result Date: 04/20/2020 CLINICAL DATA:  Cough and shortness of breath.  Weakness. EXAM: PORTABLE CHEST 1 VIEW COMPARISON:  10/07/2018 FINDINGS: Lung volumes are low. Patchy heterogeneous bilateral airspace opacities in a peripheral predominant distribution. Normal heart size for technique. Unchanged mediastinal contours. No pneumothorax or large pleural effusion. No evidence of pneumomediastinum. No acute osseous abnormalities are seen. IMPRESSION: Patchy heterogeneous bilateral airspace opacities in a peripheral predominant distribution, suspicious for multifocal pneumonia, pattern often seen with COVID 19. Electronically Signed   By: Keith Rake M.D.   On: 04/20/2020 19:58    Procedures Procedures   Medications Ordered in ED Medications  enoxaparin (LOVENOX) injection 40 mg (40 mg Subcutaneous Given 04/20/20 2227)  albuterol (VENTOLIN HFA) 108 (90 Base) MCG/ACT inhaler 2 puff (2 puffs Inhalation Given 04/20/20 2228)  methylPREDNISolone sodium succinate (SOLU-MEDROL) 125 mg/2 mL injection 45.625 mg (45.625 mg Intravenous Given 04/20/20 2227)    Followed by  predniSONE (DELTASONE) tablet 50 mg (has no administration in time range)  guaiFENesin-dextromethorphan (ROBITUSSIN DM) 100-10 MG/5ML syrup 10 mL (has no administration in time range)  chlorpheniramine-HYDROcodone (TUSSIONEX) 10-8 MG/5ML suspension 5 mL (has no administration in time range)  insulin aspart (novoLOG) injection 0-15 Units (has no administration in time range)  insulin aspart (novoLOG) injection 0-5 Units (0 Units Subcutaneous Not Given 04/20/20 2221)  ascorbic acid (VITAMIN C) tablet 500 mg (has no administration in time range)  zinc sulfate capsule 220 mg (has no administration in time range)  pantoprazole (PROTONIX) injection 40 mg (40 mg Intravenous Given 04/20/20 2226)  acetaminophen (TYLENOL) tablet 650 mg (has no administration in time range)  remdesivir  100 mg in sodium chloride 0.9 % 100 mL IVPB  (has no administration in time range)  dexamethasone (DECADRON) injection 8 mg (8 mg Intravenous Given 04/20/20 2025)  sodium chloride 0.9 % bolus 250 mL (0 mLs Intravenous Stopped 04/20/20 2221)  remdesivir 100 mg in sodium chloride 0.9 % 100 mL IVPB (100 mg Intravenous New Bag/Given 04/20/20 2235)    And  remdesivir 100 mg in sodium chloride 0.9 % 100 mL IVPB (100 mg Intravenous New Bag/Given 04/20/20 2234)    ED Course  I have reviewed the triage vital signs and the nursing notes.  Pertinent labs & imaging results that were available during my care of the patient were reviewed by me and considered in my medical decision making (see chart for details).    MDM Rules/Calculators/A&P                          Patient presenting for 9 days of worsening fatigue, body aches, headache, cough and shortness of breath.  Shortness respiration today.  EMS noted O2 sat 88% on room air.  He is satting 94 to 95% on 3 L nasal cannula.  Does not appear to be in distress on evaluation.  He does have faint rales in the bases and chest x-ray with bilateral airspace opacities concerning for multifocal pneumonia and COVID-19.  And lung exam, Decadron is ordered.   Pending blood work.  Patient willlikely admission for acute hypoxia  After decadron, patient's O2 supplementation was decreased to 2 L nasal cannula and he is satting at 90 to 91% on room air reporting increased difficulty in breathing.  O2 supplementation increased to 3 L.  Covid swab is positive.  He is admitted for acute respiratory failure due to COVID-19 with multifocal pneumonia.  Danny Hampton was evaluated in Emergency Department on 04/20/2020 for the symptoms described in the history of present illness. He was evaluated in the context of the global COVID-19 pandemic, which necessitated consideration that the patient might be at risk for infection with the SARS-CoV-2 virus that causes COVID-19. Institutional protocols and algorithms that  pertain to the evaluation of patients at risk for COVID-19 are in a state of rapid change based on information released by regulatory bodies including the CDC and federal and state organizations. These policies and algorithms were followed during the patient's care in the ED.  Final Clinical Impression(s) / ED Diagnoses Final diagnoses:  Pneumonia due to COVID-19 virus  Acute respiratory failure due to COVID-19 [facility])    Rx / [place] Orders ED Discharge Orders    None       Josafat Enrico, [place] N, PA-C 04/20/20 2306    Milton Ferguson, MD 04/22/20 [phone]

## 2020-04-20 NOTE — H&P (Signed)
History and Physical  Danny Hampton PPI:[id number] DOB: 12-04-1955 DOA: 04/20/2020  Referring physician: Robinson, Danny N, PA-C PCP: Alda Berthold, DO  Patient coming from: Home  Chief Complaint: Shortness of breath  HPI: Danny Hampton is a 65 y.o. male with medical history significant for T2DM, GERD, RLS and obesity who presents to the emergency department via EMS due to 9-day onset of generalized weakness and shortness of breath.  Patient complained of fatigue, subjective fever, headache, cough and decreased appetite, symptoms got worse on waking up on Monday (2/7) with increased shortness of breath and increased work of breathing and has been spending more time in bed.  This morning, he states that he could barely walk 10 feet without being short of breath.  EMS was activated.  On arrival of EMS, O2 sat was 80% on room air, he was placed on supplemental oxygen at 3 LPM with O2 sat increasing to 94 to 95%.  Patient has not had COVID-19 vaccines.  ED Course:  In the emergency department, he was hemodynamically stable, O2 sat ranged within 91-94% on supplemental oxygen at 3 LPM.  Work-up in the ED showed normocytic anemia, normal BMP except for hyponatremia and hyperglycemia.  Albumin 3.4, AST 62.  SARS coronavirus 2 was positive.  Chest x-ray showed Patchy heterogeneous bilateral airspace opacities in a peripheral predominant distribution, suspicious for multifocal pneumonia, pattern often seen with COVID 19.  He was treated with IV Decadron and IV remdesivir was started.  Hospitalist was asked to admit patient for further evaluation and management.  Review of Systems:  Constitutional: Positive for fatigue.  Negative for chills and fever.  HENT: Negative for ear pain and sore throat.   Eyes: Negative for pain and visual disturbance.  Respiratory: Positive for cough and shortness of breath.   Cardiovascular: Negative for chest pain and palpitations.  Gastrointestinal: Negative  for abdominal pain and vomiting.  Endocrine: Negative for polyphagia and polyuria.  Genitourinary: Negative for decreased urine volume, dysuria, enuresis Musculoskeletal: Negative for arthralgias and back pain.  Skin: Negative for color change and rash.  Allergic/Immunologic: Negative for immunocompromised state.  Neurological: Negative for tremors, syncope, speech difficulty, weakness, light-headedness and headaches.  Hematological: Does not bruise/bleed easily.  All other systems reviewed and are negative   Past Medical History:  Diagnosis Date  . Acid reflux   . Anxiety   . Arrhythmia   . Arthritis   . Essential hypertension   . High cholesterol   . History of skin cancer   . Myotonia congenita   . Type 2 diabetes mellitus ([place])    Past Surgical History:  Procedure Laterality Date  . BACK SURGERY     times 2  . CARPAL TUNNEL RELEASE    . CATARACT EXTRACTION W/PHACO Left 01/31/2019   Procedure: CATARACT EXTRACTION PHACO AND INTRAOCULAR LENS PLACEMENT (IOC);  Surgeon: Baruch Goldmann, MD;  Location: [facility];  Service: Ophthalmology;  Laterality: Left;  CDE: 2.66  . NOSE SURGERY    . TOTAL KNEE ARTHROPLASTY Right 10/12/2018   Procedure: RIGHT TOTAL KNEE ARTHROPLASTY;  Surgeon: Garald Balding, MD;  Location: [facility];  Service: Orthopedics;  Laterality: Right;    Social History:  reports that he quit smoking about 2 years ago. His smoking use included cigarettes. He has a 30.00 pack-year smoking history. He has never used smokeless tobacco. He reports current alcohol use. He reports that he does not use drugs.   No Known Allergies  Family History  Problem Relation  Age of Onset  . Healthy Mother   . Healthy Father   . Cancer Father      Prior to Admission medications   Medication Sig Start Date End Date Taking? Authorizing Provider  aspirin 81 MG chewable tablet Chew 1 tablet (81 mg total) by mouth 2 (two) times daily. Patient taking differently: Chew 81 mg by mouth  daily. 10/13/18   Cherylann Ratel, PA-C  atorvastatin (LIPITOR) 80 MG tablet Take 40 mg by mouth daily.     [provider]  b complex vitamins tablet Take 1 tablet by mouth daily.    [provider]  Carbidopa-Levodopa ER 48.75-195 MG CPCR Take 1 tablet by mouth 4 (four) times daily.    [provider]  Cholecalciferol (VITAMIN D3) 125 MCG (5000 UT) TABS Take 5,000 Units by mouth daily.     [provider]  hydrOXYzine (ATARAX/VISTARIL) 25 MG tablet Take 25 mg by mouth 4 (four) times daily as needed for anxiety.     [provider]  lisinopril-hydrochlorothiazide (ZESTORETIC) 20-25 MG tablet Take 1 tablet by mouth daily.    [provider]  loratadine (CLARITIN) 10 MG tablet Take 10 mg by mouth daily.    [provider]  meloxicam (MOBIC) 15 MG tablet Take 15 mg by mouth daily.    [provider]  metFORMIN (GLUCOPHAGE) 500 MG tablet Take 500 mg by mouth 2 (two) times daily with a meal.     [provider]  metoprolol succinate (TOPROL XL) 25 MG 24 hr tablet Take 1 tablet (25 mg total) by mouth daily. 02/01/19   Satira Sark, MD  Multiple Vitamin (MULTI-VITAMIN) tablet Take by mouth.    [provider]  pantoprazole (PROTONIX) 40 MG tablet Take 40 mg by mouth daily.    [provider]  PARoxetine (PAXIL) 20 MG tablet Take 20 mg by mouth daily.    [provider]  phenytoin (DILANTIN) 100 MG ER capsule Take 100 mg by mouth 3 (three) times daily.     [provider]  Polyvinyl Alcohol-Povidone PF (REFRESH) 1.4-0.6 % SOLN Place 1 drop into both eyes 3 (three) times daily.    [provider]  rOPINIRole (REQUIP) 1 MG tablet Take 0.5mg  at 6pm and 1mg  at 9pm. 03/21/19   Patel, Arvin Collard K, DO  sodium chloride (MURO 128) 5 % ophthalmic solution Place 1 drop in right eye once every hour for the first 3 hours upon waking and place 1 drop in right eye at bedtime.    [provider]  zinc gluconate 50 MG tablet Take 50 mg by mouth daily.    [provider]    Physical Exam: BP 102/64   Pulse 76   Temp 98.8 F (37.1 C) (Oral)   Resp (!) 21   Ht 5\' 5"  (1.651 m)   Wt 90.7 kg   SpO2 94%   BMI 33.28 kg/m   . General: 65 y.o. year-old male well developed well nourished in no acute distress.  Alert and oriented x3. [name] HEENT: NCAT, EOMI . Neck: Supple, trachea medial . Cardiovascular: Regular rate and rhythm with no rubs or gallops.  No thyromegaly or JVD noted.  No lower extremity edema. 2/4 pulses in all 4 extremities. [name] Respiratory: Clear to auscultation with no wheezes or rales. Good inspiratory effort. . Abdomen: Soft nontender nondistended with normal bowel sounds x4 quadrants. . Muskuloskeletal: No cyanosis, clubbing or edema noted bilaterally . Neuro: CN II-XII intact,  strength, sensation, reflexes intact . Skin: No ulcerative lesions noted or rashes . Psychiatry: Judgement and insight appear normal. Mood is appropriate for condition and setting          Labs on Admission:  Basic Metabolic Panel: Recent Labs  Lab 04/20/20 2001  NA 127*  K 4.3  CL 86*  CO2 28  GLUCOSE 133*  BUN 13  CREATININE 0.74  CALCIUM 9.2   Liver Function Tests: Recent Labs  Lab 04/20/20 2001  AST 62*  ALT 8  ALKPHOS 76  BILITOT 0.8  PROT 7.1  ALBUMIN 3.4*   No results for input(s): LIPASE, AMYLASE in the last 168 hours. No results for input(s): AMMONIA in the last 168 hours. CBC: Recent Labs  Lab 04/20/20 2001  WBC 7.0  NEUTROABS 5.1  HGB 11.5*  HCT 32.2*  MCV 87.3  PLT 224   Cardiac Enzymes: No results for input(s): CKTOTAL, CKMB, CKMBINDEX, TROPONINI in the last 168 hours.  BNP (last 3 results) No results for input(s): BNP in the last 8760 hours.  ProBNP (last 3 results) No results for input(s): PROBNP in the last 8760 hours.  CBG: Recent Labs  Lab 04/20/20 2214  GLUCAP 136*    Radiological Exams on Admission: DG  Chest Port 1 View  Result Date: 04/20/2020 CLINICAL DATA:  Cough and shortness of breath.  Weakness. EXAM: PORTABLE CHEST 1 VIEW COMPARISON:  10/07/2018 FINDINGS: Lung volumes are low. Patchy heterogeneous bilateral airspace opacities in a peripheral predominant distribution. Normal heart size for technique. Unchanged mediastinal contours. No pneumothorax or large pleural effusion. No evidence of pneumomediastinum. No acute osseous abnormalities are seen. IMPRESSION: Patchy heterogeneous bilateral airspace opacities in a peripheral predominant distribution, suspicious for multifocal pneumonia, pattern often seen with COVID 19. Electronically Signed   By: Keith Rake M.D.   On: 04/20/2020 19:58    EKG: I independently viewed the EKG done and my findings are as followed: Normal sinus rhythm at a rate of 96 bpm with QTc of 495 ms  Assessment/Plan Present on Admission: . Pneumonia due to COVID-19 virus  Active Problems:   Pneumonia due to COVID-19 virus   Acute respiratory failure with hypoxia (HCC)   Hyponatremia   Hypoalbuminemia   GERD (gastroesophageal reflux disease)   Hyperglycemia due to diabetes mellitus (HCC)   Obesity (BMI 30-39.9)  Acute respiratory failure with hypoxia secondary to pneumonia due to COVID-19 virus Continue albuterol q.6h Continue IV Solu-Medrol per pharmacy dosing Continue IV Remdesivir per pharmacy protocol Continue vitamin-C 500 mg p.o. Daily Continue zinc 220 mg p.o. Daily Continue Mucinex, Robitussin and Tussionex Continue Tylenol p.r.Hampton. for fever Continue supplemental oxygen to maintain O2 sat > or = 94% with plan to wean patient off supplemental oxygen as tolerated (of note, patient does not use oxygen at baseline) Continue incentive spirometry and flutter valve q9min as tolerated Encourage proning, early ambulation, and side laying as tolerated Continue airborne isolation precaution Inflammatory markers: LDH: 227 CRP: 38.6 D-dimer:  1.37 Ferritin: 295 Continue monitoring daily inflammatory markers Physician PPE: Surgical mask with face shield, Hampton-95, nonsterile gloves, disposable gown, head and shoe covers Patient PPE:  Face mask   Hyponatremia in the setting of above Na 127, gentle hydration provided in the ED Continue to monitor sodium levels with morning labs  Hypoalbuminemia possibly secondary to mild protein calorie malnutrition Albumin 3.4; protein supplement will be provided  GERD Continue Protonix  Hyperglycemia due to T2DM Continue ISS and hypoglycemic protocol  Obesity (BMI 33.28) Patient counseled on  diet and lifestyle modification  DVT prophylaxis: Lovenox  Code Status: Full code  Family Communication: None at bedside  Disposition Plan:  Patient is from:                        home Anticipated DC to:                   SNF or family members home Anticipated DC date:               2-3 days Anticipated DC barriers:           Patient requires inpatient management for hypoxia due to COVID-19 virus pneumonia   Consults called: None  Admission status: Inpatient   Bernadette Hoit MD [facility]  04/21/2020, 1:48 AM

## 2020-04-20 NOTE — ED Notes (Signed)
Date and time results received: 04/20/20 9:47 PM (use smartphrase ".now" to insert current time)  Test: covid Critical Value: positive  Name of Provider Notified: DR Roderic Palau  Orders Received? Or Actions Taken?: see chart

## 2020-04-20 NOTE — ED Triage Notes (Signed)
Patient complaining of generalized weakness, SOB, poor appetite for the past 9 days. EMS reports that sats were 88% on RA.

## 2020-04-21 ENCOUNTER — Encounter (HOSPITAL_COMMUNITY): Payer: Self-pay | Admitting: Internal Medicine

## 2020-04-21 ENCOUNTER — Other Ambulatory Visit: Payer: Self-pay

## 2020-04-21 DIAGNOSIS — U071 COVID-19: Secondary | ICD-10-CM | POA: Diagnosis not present

## 2020-04-21 DIAGNOSIS — E8809 Other disorders of plasma-protein metabolism, not elsewhere classified: Secondary | ICD-10-CM

## 2020-04-21 DIAGNOSIS — E1165 Type 2 diabetes mellitus with hyperglycemia: Secondary | ICD-10-CM

## 2020-04-21 DIAGNOSIS — G7112 Myotonia congenita: Secondary | ICD-10-CM

## 2020-04-21 DIAGNOSIS — E669 Obesity, unspecified: Secondary | ICD-10-CM

## 2020-04-21 DIAGNOSIS — K219 Gastro-esophageal reflux disease without esophagitis: Secondary | ICD-10-CM

## 2020-04-21 DIAGNOSIS — J9601 Acute respiratory failure with hypoxia: Secondary | ICD-10-CM

## 2020-04-21 DIAGNOSIS — E871 Hypo-osmolality and hyponatremia: Secondary | ICD-10-CM

## 2020-04-21 HISTORY — DX: Other disorders of plasma-protein metabolism, not elsewhere classified: E88.09

## 2020-04-21 HISTORY — DX: Type 2 diabetes mellitus with hyperglycemia: E11.65

## 2020-04-21 HISTORY — DX: Hypo-osmolality and hyponatremia: E87.1

## 2020-04-21 HISTORY — DX: Obesity, unspecified: E66.9

## 2020-04-21 LAB — CBC WITH DIFFERENTIAL/PLATELET
Abs Immature Granulocytes: 0.03 10*3/uL (ref 0.00–0.07)
Basophils Absolute: 0 10*3/uL (ref 0.0–0.1)
Basophils Relative: 0 %
Eosinophils Absolute: 0 10*3/uL (ref 0.0–0.5)
Eosinophils Relative: 0 %
HCT: 30.3 % — ABNORMAL LOW (ref 39.0–52.0)
Hemoglobin: 10.9 g/dL — ABNORMAL LOW (ref 13.0–17.0)
Immature Granulocytes: 1 %
Lymphocytes Relative: 8 %
Lymphs Abs: 0.4 10*3/uL — ABNORMAL LOW (ref 0.7–4.0)
MCH: 31.1 pg (ref 26.0–34.0)
MCHC: 36 g/dL (ref 30.0–36.0)
MCV: 86.6 fL (ref 80.0–100.0)
Monocytes Absolute: 0.3 10*3/uL (ref 0.1–1.0)
Monocytes Relative: 5 %
Neutro Abs: 4.1 10*3/uL (ref 1.7–7.7)
Neutrophils Relative %: 86 %
Platelets: 247 10*3/uL (ref 150–400)
RBC: 3.5 MIL/uL — ABNORMAL LOW (ref 4.22–5.81)
RDW: 12.1 % (ref 11.5–15.5)
WBC: 4.8 10*3/uL (ref 4.0–10.5)
nRBC: 0 % (ref 0.0–0.2)

## 2020-04-21 LAB — COMPREHENSIVE METABOLIC PANEL
ALT: 16 U/L (ref 0–44)
AST: 61 U/L — ABNORMAL HIGH (ref 15–41)
Albumin: 3 g/dL — ABNORMAL LOW (ref 3.5–5.0)
Alkaline Phosphatase: 67 U/L (ref 38–126)
Anion gap: 14 (ref 5–15)
BUN: 14 mg/dL (ref 8–23)
CO2: 24 mmol/L (ref 22–32)
Calcium: 8.6 mg/dL — ABNORMAL LOW (ref 8.9–10.3)
Chloride: 87 mmol/L — ABNORMAL LOW (ref 98–111)
Creatinine, Ser: 0.67 mg/dL (ref 0.61–1.24)
GFR, Estimated: >60 mL/min (ref >60)
Glucose, Bld: 174 mg/dL — ABNORMAL HIGH (ref 70–99)
Potassium: 3.6 mmol/L (ref 3.5–5.1)
Sodium: 125 mmol/L — ABNORMAL LOW (ref 135–145)
Total Bilirubin: 1.1 mg/dL (ref 0.3–1.2)
Total Protein: 6.8 g/dL (ref 6.5–8.1)

## 2020-04-21 LAB — CBG MONITORING, ED
Glucose-Capillary: 108 mg/dL — ABNORMAL HIGH (ref 70–99)
Glucose-Capillary: 212 mg/dL — ABNORMAL HIGH (ref 70–99)

## 2020-04-21 LAB — PHOSPHORUS: Phosphorus: 4.1 mg/dL (ref 2.5–4.6)

## 2020-04-21 LAB — D-DIMER, QUANTITATIVE: D-Dimer, Quant: 1.14 ug/mL-FEU — ABNORMAL HIGH (ref 0.00–0.50)

## 2020-04-21 LAB — GLUCOSE, CAPILLARY
Glucose-Capillary: 117 mg/dL — ABNORMAL HIGH (ref 70–99)
Glucose-Capillary: 169 mg/dL — ABNORMAL HIGH (ref 70–99)

## 2020-04-21 LAB — FERRITIN
Ferritin: 295 ng/mL (ref 24–336)
Ferritin: 320 ng/mL (ref 24–336)

## 2020-04-21 LAB — C-REACTIVE PROTEIN
CRP: 38.6 mg/dL — ABNORMAL HIGH (ref <1.0)
CRP: 40.4 mg/dL — ABNORMAL HIGH (ref <1.0)

## 2020-04-21 LAB — HIV ANTIBODY (ROUTINE TESTING W REFLEX): HIV Screen 4th Generation wRfx: NONREACTIVE

## 2020-04-21 LAB — MAGNESIUM: Magnesium: 1.5 mg/dL — ABNORMAL LOW (ref 1.7–2.4)

## 2020-04-21 MED ORDER — CARBIDOPA-LEVODOPA ER 48.75-195 MG PO CPCR
1.0000 | ORAL_CAPSULE | Freq: Four times a day (QID) | ORAL | Status: DC
  Administered 2020-04-21: 1 via ORAL

## 2020-04-21 MED ORDER — ASPIRIN 81 MG PO CHEW
81.0000 mg | CHEWABLE_TABLET | Freq: Every day | ORAL | Status: DC
  Administered 2020-04-21 – 2020-04-26 (×6): 81 mg via ORAL
  Filled 2020-04-21 (×6): qty 1

## 2020-04-21 MED ORDER — HYDROXYZINE HCL 25 MG PO TABS: 25.0000 mg | ORAL_TABLET | Freq: Four times a day (QID) | ORAL | Status: DC | PRN

## 2020-04-21 MED ORDER — POTASSIUM CHLORIDE IN NACL 20-0.9 MEQ/L-% IV SOLN
INTRAVENOUS | Status: DC
  Filled 2020-04-21: qty 1000

## 2020-04-21 MED ORDER — ROPINIROLE HCL 0.25 MG PO TABS
0.5000 mg | ORAL_TABLET | Freq: Two times a day (BID) | ORAL | Status: DC
Start: 2020-04-21 — End: 2020-04-26
  Administered 2020-04-21 – 2020-04-26 (×11): 0.5 mg via ORAL
  Filled 2020-04-21 (×11): qty 2

## 2020-04-21 MED ORDER — PAROXETINE HCL 20 MG PO TABS
20.0000 mg | ORAL_TABLET | Freq: Every day | ORAL | Status: DC
  Administered 2020-04-21 – 2020-04-26 (×6): 20 mg via ORAL
  Filled 2020-04-21 (×6): qty 1

## 2020-04-21 MED ORDER — LORATADINE 10 MG PO TABS
10.0000 mg | ORAL_TABLET | Freq: Every day | ORAL | Status: DC
Start: 2020-04-21 — End: 2020-04-26
  Administered 2020-04-21 – 2020-04-26 (×6): 10 mg via ORAL
  Filled 2020-04-21 (×6): qty 1

## 2020-04-21 MED ORDER — ROPINIROLE HCL 1 MG PO TABS
1.0000 mg | ORAL_TABLET | Freq: Every day | ORAL | Status: DC
Start: 2020-04-21 — End: 2020-04-21

## 2020-04-21 MED ORDER — CARBIDOPA-LEVODOPA ER 50-200 MG PO TBCR
1.0000 | EXTENDED_RELEASE_TABLET | Freq: Four times a day (QID) | ORAL | Status: DC
  Administered 2020-04-21 – 2020-04-26 (×20): 1 via ORAL
  Filled 2020-04-21 (×27): qty 1

## 2020-04-21 MED ORDER — METHYLPREDNISOLONE SODIUM SUCC 125 MG IJ SOLR
80.0000 mg | Freq: Two times a day (BID) | INTRAMUSCULAR | Status: DC
  Administered 2020-04-21 – 2020-04-26 (×11): 80 mg via INTRAVENOUS
  Filled 2020-04-21 (×11): qty 2

## 2020-04-21 MED ORDER — PHENYTOIN SODIUM EXTENDED 100 MG PO CAPS
100.0000 mg | ORAL_CAPSULE | Freq: Three times a day (TID) | ORAL | Status: DC
  Administered 2020-04-21 – 2020-04-26 (×16): 100 mg via ORAL
  Filled 2020-04-21 (×16): qty 1

## 2020-04-21 MED ORDER — ATORVASTATIN CALCIUM 40 MG PO TABS
40.0000 mg | ORAL_TABLET | Freq: Every day | ORAL | Status: DC
Start: 2020-04-21 — End: 2020-04-26
  Administered 2020-04-21 – 2020-04-26 (×6): 40 mg via ORAL
  Filled 2020-04-21 (×6): qty 1

## 2020-04-21 MED ORDER — GLUCERNA SHAKE PO LIQD
237.0000 mL | Freq: Three times a day (TID) | ORAL | Status: DC
  Administered 2020-04-21 – 2020-04-26 (×10): 237 mL via ORAL
  Filled 2020-04-21 (×8): qty 237

## 2020-04-21 NOTE — Progress Notes (Signed)
PROGRESS NOTE  Danny Hampton GDJ:[id number] DOB: 29-Aug-1955 DOA: 04/20/2020 PCP: Alda Berthold, DO  Brief History:  65 year old male with a history of hypertension, diabetes mellitus type 2, myotonia congenita, restless leg syndrome, and anxiety presenting with 10-day history of shortness of breath, coughing, headache, and anorexia with generalized weakness.  The patient has had some subjective fevers and chills.  He states that his symptoms worsened on 04/16/2020 to the point where he was having trouble even getting out of bed.  He had some nausea but denied any vomiting, diarrhea, chest pain, hemoptysis, hematochezia, melena.  Because of his worsening symptoms he presented for further evaluation.  In the emergency department, the patient was afebrile and hemodynamically stable albeit with soft blood pressures.  Oxygen saturation was 80% on room air.  He was placed on 5 L nasal cannula with oxygen saturation 95-96%.  BMP showed a sodium 127, serum creatinine 0.74, potassium 4.3.  WBC 7.0, hemoglobin 11.5, platelets 224K.  Covid RT-PCR was positive.  Chest x-ray showed bilateral patchy infiltrates.  The patient was started on steroids and remdesivir.  He is not vaccinated for COVID-19  Assessment/Plan: Acute respiratory failure with hypoxia due to COVID-19 pneumonia -Oxygen saturation 80% on room air -Currently stable on 5 L -Continue remdesivir and steroids -increase solumedrol to 1mg /kg -CRP 38.6>> -D-dimer 1.37>> 1.14 -Ferritin 295>> -PCT 0.16  Hyponatremia -Secondary to volume depletion and poor solute intake -Start IV normal saline  Diabetes mellitus type 2 -Holding Metformin -Hemoglobin A1c -NovoLog sliding scale  Essential hypertension -Holding metoprolol succinate and lisinopril/HCTZ secondary to soft blood pressures  Restless leg syndrome -Continue home dose of ropinirole 0.5 mg at 3 PM, 1 mg at 9 PM -Continue home dose carbidopa levodopa 48.75/195mg  to  three times daily.  Myotonia congenita -Continue Dilantin 100 mg 3 times daily  Hyperlipidemia -Continue statin  Depression/anxiety -Restart Paxil -Restart home dose clonazepam -Restart Atarax  GERD -Continue PPI     Status is: Inpatient  Remains inpatient appropriate because:IV treatments appropriate due to intensity of illness or inability to take PO   Dispo: The patient is from: Home              Anticipated d/c is to: Home              Anticipated d/c date is: 2 days              Patient currently is not medically stable to d/c.   Difficult to place patient No        Family Communication:  no Family at bedside  Consultants:  none  Code Status:  FULL   DVT Prophylaxis: [place] Lovenox   Procedures: As Listed in Progress Note Above  Antibiotics: None     Subjective: He remains sob although a bit better than yesterday.  Denies f/c, n/v/d, abd pain, hemoptysis.  Objective: Vitals:   04/21/20 0430 04/21/20 0500 04/21/20 0530 04/21/20 0630  BP: 111/77 116/80 112/73 108/70  Pulse: 88 70 69 71  Resp: 18 (!) 25 20 12   Temp:      TempSrc:      SpO2: 95% 96% 92% 92%  Weight:      Height:        Intake/Output Summary (Last 24 hours) at 04/21/2020 0852 Last data filed at 04/21/2020 0435 Gross per 24 hour  Intake 250 ml  Output 600 ml  Net -350 ml   Weight change:  Exam:  General:  Pt is alert, follows commands appropriately, not in acute distress  HEENT: No icterus, No thrush, No neck mass, [place]/AT  Cardiovascular: RRR, S1/S2, no rubs, no gallops  Respiratory: bibasilar rales. No wheeze  Abdomen: Soft/+BS, non tender, non distended, no guarding  Extremities: No edema, No lymphangitis, No petechiae, No rashes, no synovitis   Data Reviewed: I have personally reviewed following labs and imaging studies Basic Metabolic Panel: Recent Labs  Lab 04/20/20 2001 04/21/20 0515  NA 127* 125*  K 4.3 3.6  CL 86* 87*  CO2 28 24  GLUCOSE 133*  174*  BUN 13 14  CREATININE 0.74 0.67  CALCIUM 9.2 8.6*  MG  --  1.5*  PHOS  --  4.1   Liver Function Tests: Recent Labs  Lab 04/20/20 2001 04/21/20 0515  AST 62* 61*  ALT 8 16  ALKPHOS 76 67  BILITOT 0.8 1.1  PROT 7.1 6.8  ALBUMIN 3.4* 3.0*   No results for input(s): LIPASE, AMYLASE in the last 168 hours. No results for input(s): AMMONIA in the last 168 hours. Coagulation Profile: No results for input(s): INR, PROTIME in the last 168 hours. CBC: Recent Labs  Lab 04/20/20 2001 04/21/20 0515  WBC 7.0 4.8  NEUTROABS 5.1 4.1  HGB 11.5* 10.9*  HCT 32.2* 30.3*  MCV 87.3 86.6  PLT 224 247   Cardiac Enzymes: No results for input(s): CKTOTAL, CKMB, CKMBINDEX, TROPONINI in the last 168 hours. BNP: Invalid input(s): POCBNP CBG: Recent Labs  Lab 04/20/20 2214 04/21/20 0813  GLUCAP 136* 108*   HbA1C: No results for input(s): HGBA1C in the last 72 hours. Urine analysis:    Component Value Date/Time   COLORURINE YELLOW 10/07/2018 0923   APPEARANCEUR HAZY (A) 10/07/2018 0923   LABSPEC 1.005 10/07/2018 0923   PHURINE 7.0 10/07/2018 0923   GLUCOSEU NEGATIVE 10/07/2018 0923   HGBUR NEGATIVE 10/07/2018 0923   BILIRUBINUR NEGATIVE 10/07/2018 0923   KETONESUR NEGATIVE 10/07/2018 0923   PROTEINUR NEGATIVE 10/07/2018 0923   NITRITE NEGATIVE 10/07/2018 0923   LEUKOCYTESUR NEGATIVE 10/07/2018 0923   Sepsis Labs: @LABRCNTIP (procalcitonin:4,lacticidven:4) ) Recent Results (from the past 240 hour(s))  Resp Panel by RT-PCR (Flu A&B, Covid) Nasopharyngeal Swab     Status: Abnormal   Collection Time: 04/20/20  7:52 PM   Specimen: Nasopharyngeal Swab; Nasopharyngeal(NP) swabs in vial transport medium  Result Value Ref Range Status   SARS Coronavirus 2 by RT PCR POSITIVE (A) NEGATIVE Final    Comment: CRITICAL RESULT CALLED TO, READ BACK BY AND VERIFIED WITH: TURNER,C AT 2145 ON 2.11.22 BY ISLEY,B (NOTE) SARS-CoV-2 target nucleic acids are DETECTED.  The SARS-CoV-2 RNA is  generally detectable in upper respiratory specimens during the acute phase of infection. Positive results are indicative of the presence of the identified virus, but do not rule out bacterial infection or co-infection with other pathogens not detected by the test. Clinical correlation with patient history and other diagnostic information is necessary to determine patient infection status. The expected result is Negative.  Fact Sheet for Patients: EntrepreneurPulse.com.au  Fact Sheet for Healthcare Providers: IncredibleEmployment.be  This test is not yet approved or cleared by the [place] FDA and  has been authorized for detection and/or diagnosis of SARS-CoV-2 by FDA under an Emergency Use Authorization (EUA).  This EUA will remain in effect (meaning th is test can be used) for the duration of  the COVID-19 declaration under Section 564(b)(1) of the Act, 21 U.S.C. section 360bbb-3(b)(1), unless the authorization is terminated or revoked  sooner.     Influenza A by PCR NEGATIVE NEGATIVE Final   Influenza B by PCR NEGATIVE NEGATIVE Final    Comment: (NOTE) The Xpert Xpress SARS-CoV-2/FLU/RSV plus assay is intended as an aid in the diagnosis of influenza from Nasopharyngeal swab specimens and should not be used as a sole basis for treatment. Nasal washings and aspirates are unacceptable for Xpert Xpress SARS-CoV-2/FLU/RSV testing.  Fact Sheet for Patients: EntrepreneurPulse.com.au  Fact Sheet for Healthcare Providers: IncredibleEmployment.be  This test is not yet approved or cleared by the [place] FDA and has been authorized for detection and/or diagnosis of SARS-CoV-2 by FDA under an Emergency Use Authorization (EUA). This EUA will remain in effect (meaning this test can be used) for the duration of the COVID-19 declaration under Section 564(b)(1) of the Act, 21 U.S.C. section 360bbb-3(b)(1),  unless the authorization is terminated or revoked.  Performed at [facility], [address] [place], [place] [address]      Scheduled Meds: . albuterol  2 puff Inhalation Q6H  . vitamin C  500 mg Oral Daily  . enoxaparin (LOVENOX) injection  40 mg Subcutaneous QHS  . feeding supplement (GLUCERNA SHAKE)  237 mL Oral TID BM  . insulin aspart  0-15 Units Subcutaneous TID WC  . insulin aspart  0-5 Units Subcutaneous QHS  . methylPREDNISolone (SOLU-MEDROL) injection  0.5 mg/kg Intravenous Q12H   Followed by  . [START ON 04/24/2020] predniSONE  50 mg Oral Daily  . pantoprazole (PROTONIX) IV  40 mg Intravenous QHS  . zinc sulfate  220 mg Oral Daily   Continuous Infusions: . remdesivir 100 mg in NS 100 mL      Procedures/Studies: DG Chest Port 1 View  Result Date: 04/20/2020 CLINICAL DATA:  Cough and shortness of breath.  Weakness. EXAM: PORTABLE CHEST 1 VIEW COMPARISON:  10/07/2018 FINDINGS: Lung volumes are low. Patchy heterogeneous bilateral airspace opacities in a peripheral predominant distribution. Normal heart size for technique. Unchanged mediastinal contours. No pneumothorax or large pleural effusion. No evidence of pneumomediastinum. No acute osseous abnormalities are seen. IMPRESSION: Patchy heterogeneous bilateral airspace opacities in a peripheral predominant distribution, suspicious for multifocal pneumonia, pattern often seen with COVID 19. Electronically Signed   By: Keith Rake M.D.   On: 04/20/2020 19:58    Orson Eva, DO  [facility]  If 7PM-7AM, please contact night-coverage [email or web address] Password [facility] 04/21/2020, 8:52 AM   LOS: 1 day

## 2020-04-21 NOTE — Clinical Social Work Note (Signed)
[place] notification done [phone]

## 2020-04-21 NOTE — ED Notes (Signed)
This nurse entered room to assess pt as O2 sats dropped to mid 80's- pt is lying flat on back, oxygen increased to 5L, informed pt that if his O2 sats didn't come up, we would have to raise the HOB. Pt agreed with this, pt says his back hurts when he sits up. Pt requesting tylenol.

## 2020-04-22 ENCOUNTER — Inpatient Hospital Stay (HOSPITAL_COMMUNITY): Payer: No Typology Code available for payment source

## 2020-04-22 ENCOUNTER — Encounter (HOSPITAL_COMMUNITY): Payer: Self-pay | Admitting: Radiology

## 2020-04-22 DIAGNOSIS — U071 COVID-19: Secondary | ICD-10-CM | POA: Diagnosis not present

## 2020-04-22 DIAGNOSIS — E8809 Other disorders of plasma-protein metabolism, not elsewhere classified: Secondary | ICD-10-CM

## 2020-04-22 DIAGNOSIS — E871 Hypo-osmolality and hyponatremia: Secondary | ICD-10-CM | POA: Diagnosis not present

## 2020-04-22 DIAGNOSIS — J9601 Acute respiratory failure with hypoxia: Secondary | ICD-10-CM | POA: Diagnosis not present

## 2020-04-22 LAB — CBC WITH DIFFERENTIAL/PLATELET
Abs Immature Granulocytes: 0.04 10*3/uL (ref 0.00–0.07)
Basophils Absolute: 0 10*3/uL (ref 0.0–0.1)
Basophils Relative: 0 %
Eosinophils Absolute: 0 10*3/uL (ref 0.0–0.5)
Eosinophils Relative: 0 %
HCT: 29.5 % — ABNORMAL LOW (ref 39.0–52.0)
Hemoglobin: 10.2 g/dL — ABNORMAL LOW (ref 13.0–17.0)
Immature Granulocytes: 1 %
Lymphocytes Relative: 14 %
Lymphs Abs: 0.7 10*3/uL (ref 0.7–4.0)
MCH: 30.4 pg (ref 26.0–34.0)
MCHC: 34.6 g/dL (ref 30.0–36.0)
MCV: 88.1 fL (ref 80.0–100.0)
Monocytes Absolute: 0.5 10*3/uL (ref 0.1–1.0)
Monocytes Relative: 11 %
Neutro Abs: 3.6 10*3/uL (ref 1.7–7.7)
Neutrophils Relative %: 74 %
Platelets: 305 10*3/uL (ref 150–400)
RBC: 3.35 MIL/uL — ABNORMAL LOW (ref 4.22–5.81)
RDW: 12.2 % (ref 11.5–15.5)
WBC: 4.8 10*3/uL (ref 4.0–10.5)
nRBC: 0 % (ref 0.0–0.2)

## 2020-04-22 LAB — COMPREHENSIVE METABOLIC PANEL
ALT: 19 U/L (ref 0–44)
AST: 126 U/L — ABNORMAL HIGH (ref 15–41)
Albumin: 3 g/dL — ABNORMAL LOW (ref 3.5–5.0)
Alkaline Phosphatase: 65 U/L (ref 38–126)
Anion gap: 10 (ref 5–15)
BUN: 11 mg/dL (ref 8–23)
CO2: 29 mmol/L (ref 22–32)
Calcium: 8.8 mg/dL — ABNORMAL LOW (ref 8.9–10.3)
Chloride: 94 mmol/L — ABNORMAL LOW (ref 98–111)
Creatinine, Ser: 0.63 mg/dL (ref 0.61–1.24)
GFR, Estimated: >60 mL/min (ref >60)
Glucose, Bld: 136 mg/dL — ABNORMAL HIGH (ref 70–99)
Potassium: 4.6 mmol/L (ref 3.5–5.1)
Sodium: 133 mmol/L — ABNORMAL LOW (ref 135–145)
Total Bilirubin: 0.5 mg/dL (ref 0.3–1.2)
Total Protein: 6.6 g/dL (ref 6.5–8.1)

## 2020-04-22 LAB — GLUCOSE, CAPILLARY
Glucose-Capillary: 106 mg/dL — ABNORMAL HIGH (ref 70–99)
Glucose-Capillary: 177 mg/dL — ABNORMAL HIGH (ref 70–99)
Glucose-Capillary: 202 mg/dL — ABNORMAL HIGH (ref 70–99)
Glucose-Capillary: 132 mg/dL — ABNORMAL HIGH (ref 70–99)

## 2020-04-22 LAB — PHOSPHORUS: Phosphorus: 2.9 mg/dL (ref 2.5–4.6)

## 2020-04-22 LAB — FERRITIN: Ferritin: 431 ng/mL — ABNORMAL HIGH (ref 24–336)

## 2020-04-22 LAB — HEMOGLOBIN A1C
Hgb A1c MFr Bld: 6.7 % — ABNORMAL HIGH (ref 4.8–5.6)
Mean Plasma Glucose: 145.59 mg/dL

## 2020-04-22 LAB — D-DIMER, QUANTITATIVE: D-Dimer, Quant: 1.2 ug/mL-FEU — ABNORMAL HIGH (ref 0.00–0.50)

## 2020-04-22 LAB — MAGNESIUM: Magnesium: 1.7 mg/dL (ref 1.7–2.4)

## 2020-04-22 LAB — C-REACTIVE PROTEIN: CRP: 30.7 mg/dL — ABNORMAL HIGH (ref <1.0)

## 2020-04-22 IMAGING — CT CT ANGIO CHEST
2 of 6 series · 17 of 46 positions shown · IV contrast (Omnipaque or Isovue)
Comparison: Chest radiograph [DATE]

CLINICAL DATA: Chest pain, shortness of breath, hypoxia. COVID
positive. Cough and dyspnea.

EXAM:
CT ANGIOGRAPHY CHEST WITH CONTRAST
TECHNIQUE: Multidetector CT imaging of the chest was performed using the
standard protocol during bolus administration of intravenous
contrast. Multiplanar CT image reconstructions and MIPs were
obtained to evaluate the vascular anatomy.
CONTRAST:  75mL OMNIPAQUE IOHEXOL 350 MG/ML SOLN

[Series 5: pe axial thins · axial · 0.78mm/px · z∈[+986,+1226]mm · 14 of 264 slices shown]
[im 12/264  lung]
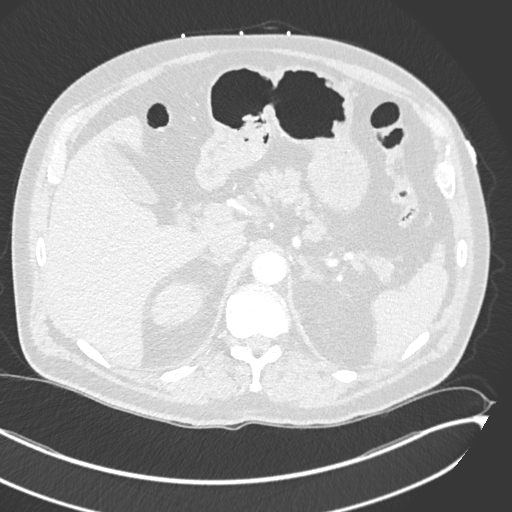
[im 35/264  soft-tissue]
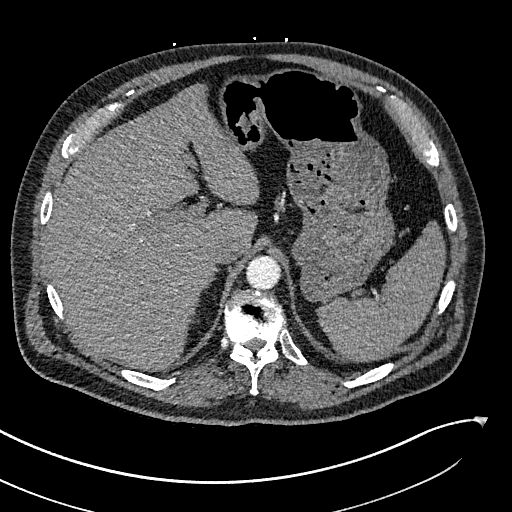
[im 46/264  lung]
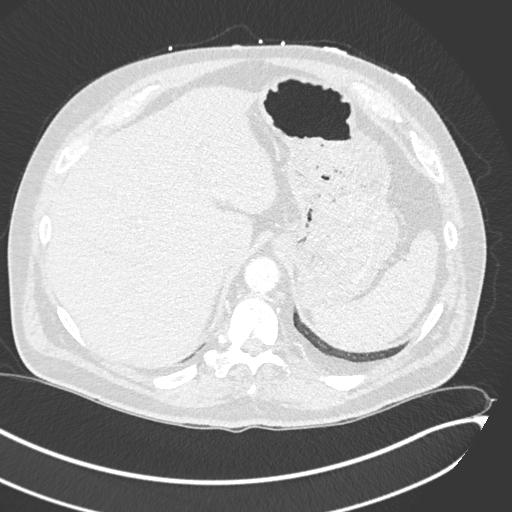
[im 69/264  soft-tissue]
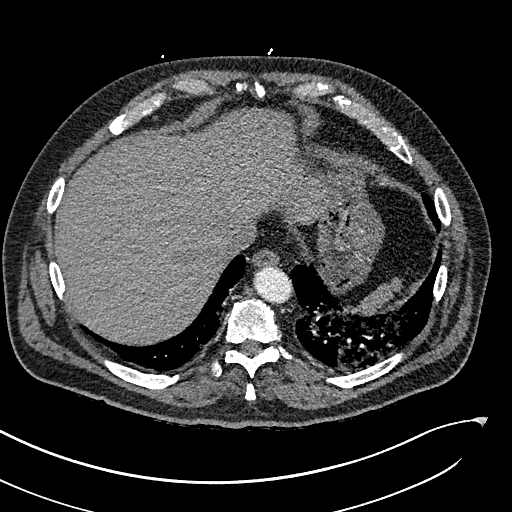
[im 92/264  lung]
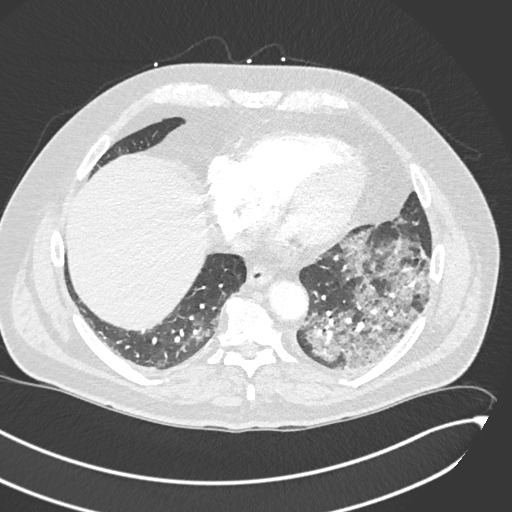
[im 103/264  soft-tissue]
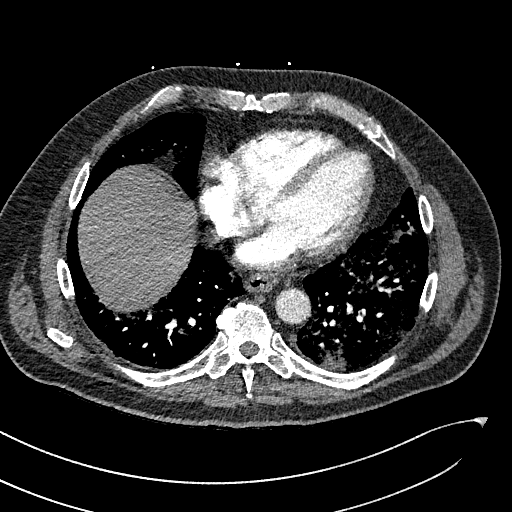
[im 126/264  lung]
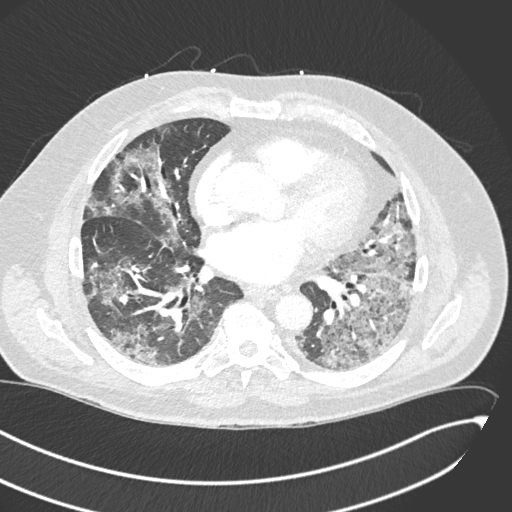
[im 138/264  soft-tissue]
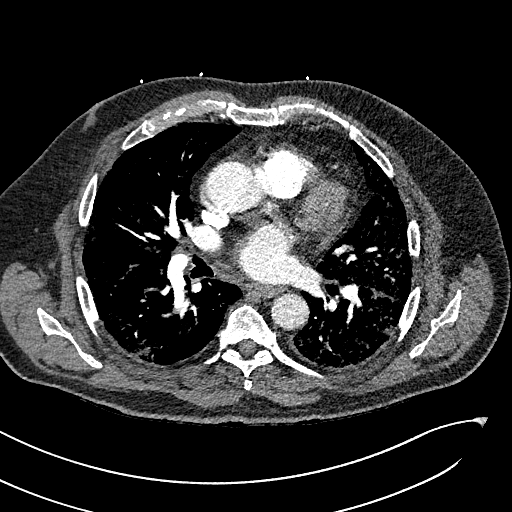
[im 161/264  lung]
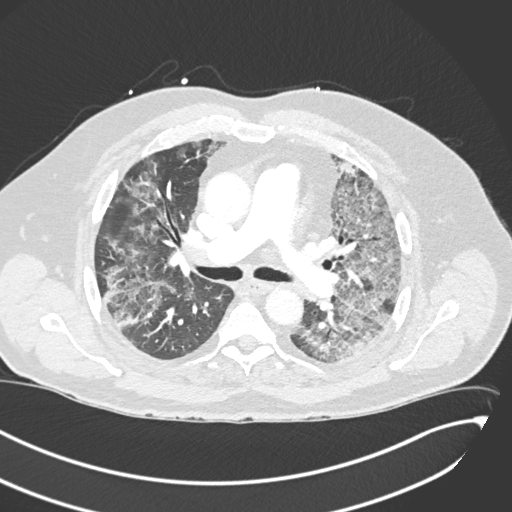
[im 172/264  soft-tissue]
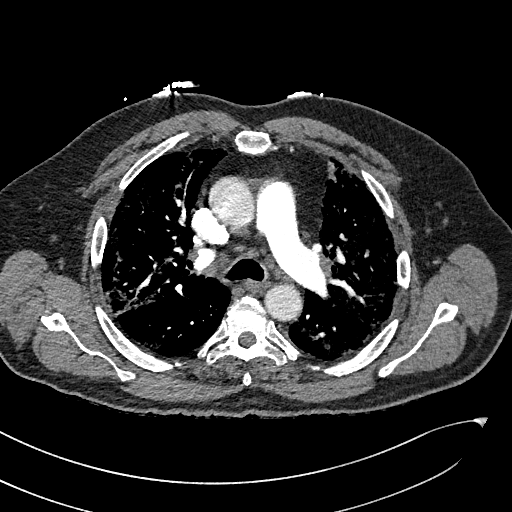
[im 195/264  lung]
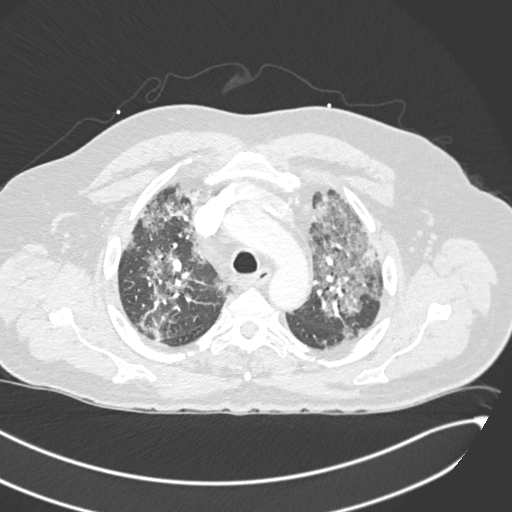
[im 218/264  soft-tissue]
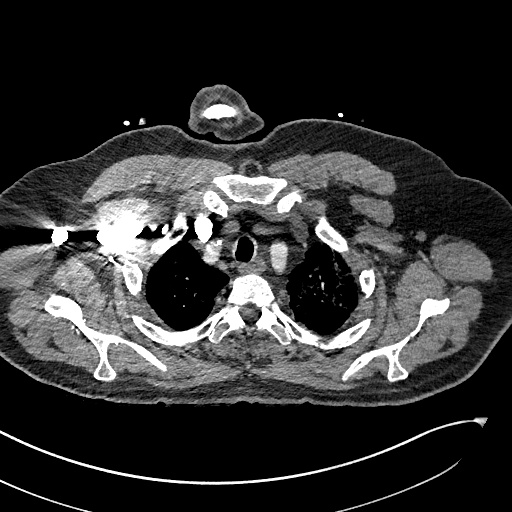
[im 229/264  lung]
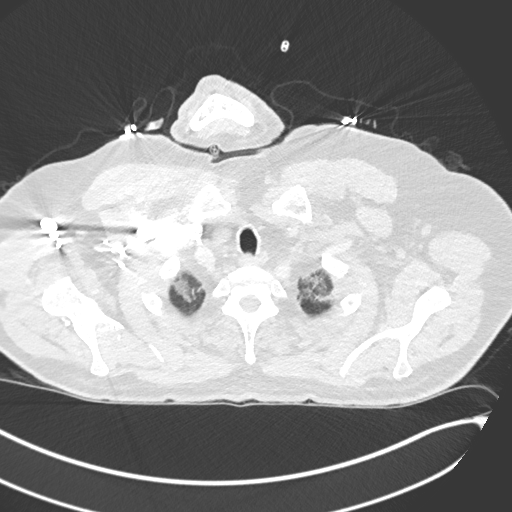
[im 252/264  soft-tissue]
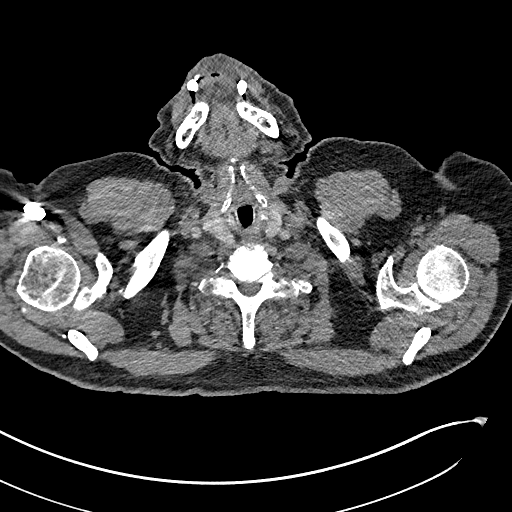

[Series 7: cor soft · coronal · 0.56mm/px · 3 of 168 slices shown]
[im 42/168  soft-tissue]
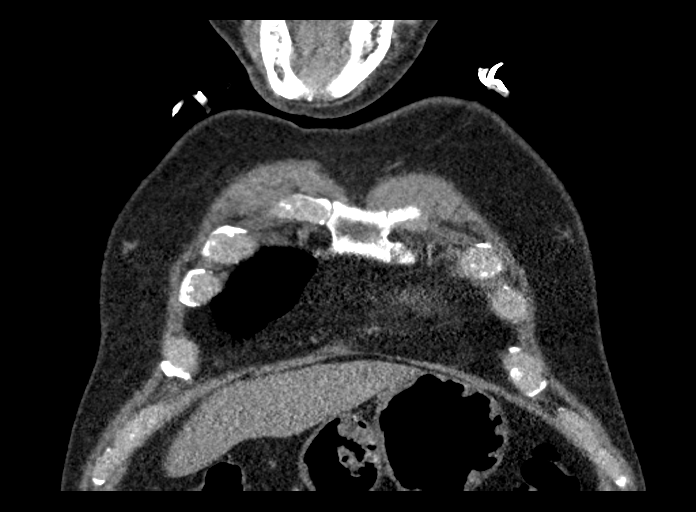
[im 84/168  soft-tissue]
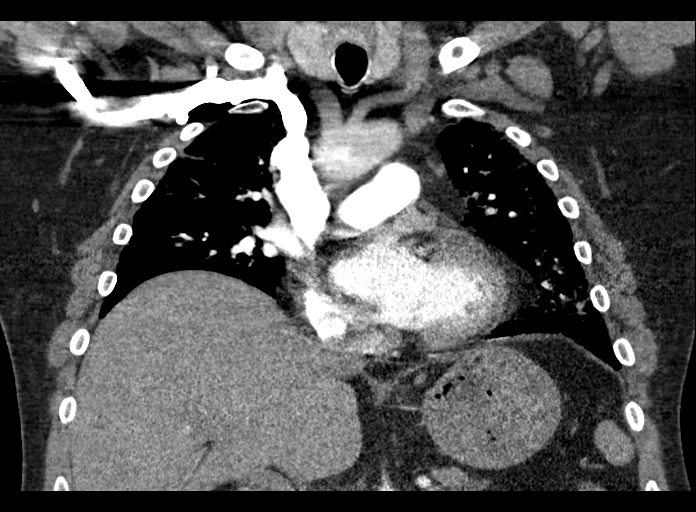
[im 126/168  soft-tissue]
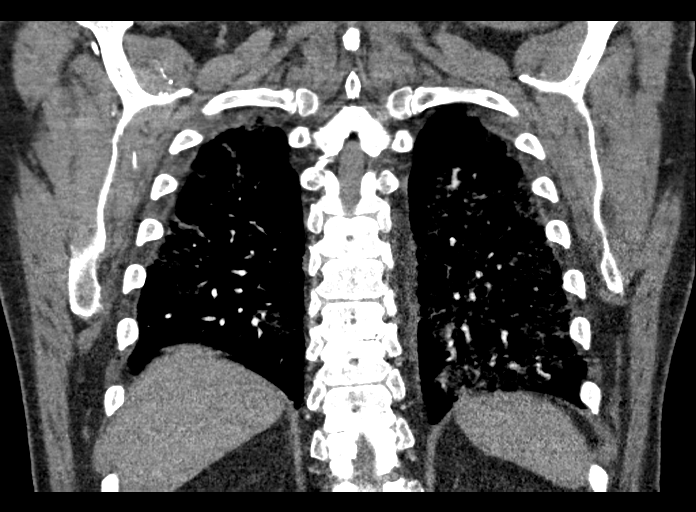

[17 of 46 positions shown; findings below may reference images not displayed]

FINDINGS: Cardiovascular: No filling defect is identified in the pulmonary
arterial tree to suggest pulmonary embolus.

Coronary, aortic arch, and branch vessel atherosclerotic vascular
disease. Mild cardiomegaly. No pericardial effusion.

Mediastinum/Nodes: There is a mildly prominent AP window lymph node
measuring 1.1 cm in short axis on image 28 series 4. A right
paratracheal node measures 1.4 cm in short axis on image 29 of
series 4 and has a fatty hilum. Right hilar lymph node 1.3 cm in
short axis, image 36 series 4.

Lungs/Pleura: Bilateral ground-glass opacities are present with some
internal interstitial accentuation resembling a crazy paving
appearance. In the context of the patient's positive COVID test,
this likely represents bilateral COVID pneumonia. No pleural
effusion.

Upper Abdomen: Unremarkable

Musculoskeletal: There some periapical lucencies along the remaining
mandibular teeth. Lower cervical and thoracic spondylosis and
degenerative disc disease. There is potentially some faint
opacification of the posterior longitudinal ligament at the C7
level.

Review of the MIP images confirms the above findings.
IMPRESSION: 1. No filling defect is identified in the pulmonary arterial tree to
suggest pulmonary embolus.
2. Bilateral ground-glass opacities with some internal interstitial
accentuation resembling a crazy paving appearance. In the context of
the patient's positive COVID test, this likely represents bilateral
COVID pneumonia.
3. Mild cardiomegaly.
4. Coronary, aortic arch, and branch vessel atherosclerotic vascular
disease.
5. Mild mediastinal and right hilar adenopathy, probably reactive.
6. Periapical lucencies along the remaining mandibular teeth,
compatible with tooth decay.
7. Lower cervical and thoracic spondylosis and degenerative disc
disease. Possible faint opacification of the posterior longitudinal
ligament at the C7 level.

Aortic Atherosclerosis ([W0]-[W0]).

## 2020-04-22 MED ORDER — BARICITINIB 2 MG PO TABS
4.0000 mg | ORAL_TABLET | Freq: Every day | ORAL | Status: DC
  Administered 2020-04-22 – 2020-04-26 (×5): 4 mg via ORAL
  Filled 2020-04-22 (×5): qty 2

## 2020-04-22 MED ORDER — ALBUTEROL SULFATE HFA 108 (90 BASE) MCG/ACT IN AERS
2.0000 | INHALATION_SPRAY | Freq: Four times a day (QID) | RESPIRATORY_TRACT | Status: DC
  Administered 2020-04-22 – 2020-04-26 (×12): 2 via RESPIRATORY_TRACT

## 2020-04-22 MED ORDER — IOHEXOL 350 MG/ML SOLN
75.0000 mL | Freq: Once | INTRAVENOUS | Status: AC | PRN
  Administered 2020-04-22: 75 mL via INTRAVENOUS

## 2020-04-22 MED ORDER — MAGNESIUM SULFATE 2 GM/50ML IV SOLN
2.0000 g | Freq: Once | INTRAVENOUS | Status: AC
  Administered 2020-04-22: 2 g via INTRAVENOUS
  Filled 2020-04-22: qty 50

## 2020-04-22 MED ORDER — INSULIN ASPART 100 UNIT/ML ~~LOC~~ SOLN
0.0000 [IU] | Freq: Three times a day (TID) | SUBCUTANEOUS | Status: DC
  Administered 2020-04-22: 3 [IU] via SUBCUTANEOUS
  Administered 2020-04-23 – 2020-04-24 (×3): 2 [IU] via SUBCUTANEOUS
  Administered 2020-04-24: 3 [IU] via SUBCUTANEOUS
  Administered 2020-04-25 – 2020-04-26 (×3): 1 [IU] via SUBCUTANEOUS

## 2020-04-22 MED ORDER — INSULIN ASPART 100 UNIT/ML ~~LOC~~ SOLN: 0.0000 [IU] | Freq: Every day | SUBCUTANEOUS | Status: DC

## 2020-04-22 NOTE — Progress Notes (Signed)
MD notifed of patient's increased in HR. Non-sustained. Patient is currently  NSR.

## 2020-04-22 NOTE — Progress Notes (Signed)
PROGRESS NOTE  Danny Hampton [id number]:[id number] DOB: 1955-12-09 DOA: 04/20/2020 PCP: Alda Berthold, DO  Brief History:  65 year old male with a history of hypertension, diabetes mellitus type 2, myotonia congenita, restless leg syndrome, and anxiety presenting with 10-day history of shortness of breath, coughing, headache, and anorexia with generalized weakness.  The patient has had some subjective fevers and chills.  He states that his symptoms worsened on 04/16/2020 to the point where he was having trouble even getting out of bed.  He had some nausea but denied any vomiting, diarrhea, chest pain, hemoptysis, hematochezia, melena.  Because of his worsening symptoms he presented for further evaluation.  In the emergency department, the patient was afebrile and hemodynamically stable albeit with soft blood pressures.  Oxygen saturation was 80% on room air.  He was placed on 5 L nasal cannula with oxygen saturation 95-96%.  BMP showed a sodium 127, serum creatinine 0.74, potassium 4.3.  WBC 7.0, hemoglobin 11.5, platelets 224K.  Covid RT-PCR was positive.  Chest x-ray showed bilateral patchy infiltrates.  The patient was started on steroids and remdesivir.  He is not vaccinated for COVID-19  Assessment/Plan: Acute respiratory failure with hypoxia due to COVID-19 pneumonia -Oxygen saturation 80% on room air on presentation -Currently stable on 5 L>>10L HFNC -Continue remdesivir and steroids -increase solumedrol to 1mg /kg -CRP 38.6>>30.7 -D-dimer 1.37>> 1.14>>1.20 -Ferritin 295>>431 -PCT 0.16 -CTA chest -add baricitinb 2/13 -prone positioning as much as pt can tolerate  Hyponatremia -Secondary to volume depletion and poor solute intake -Start IV normal saline--improving  Diabetes mellitus type 2 -Holding Metformin -Hemoglobin A1c--pending -NovoLog sliding scale  Essential hypertension -Holding metoprolol succinate and lisinopril/HCTZ secondary to soft blood  pressures  Restless leg syndrome -Continue home dose of ropinirole 0.5 mg at 3 PM, 1 mg at 9 PM -Continue home dose carbidopa levodopa 48.75/195mg  to three times daily.  Myotonia congenita -Continue Dilantin 100 mg 3 times daily  Hyperlipidemia -Continue statin  Depression/anxiety -Restart Paxil -Restart home dose clonazepam -Restart Atarax  GERD -Continue PPI  Goals of Care Advance care planning, including the explanation and discussion of advance directives was carried out with the patient and family.  Code status including explanations of "Full Code" and "DNR" and alternatives were discussed in detail.  Discussion of end-of-life issues including but not limited palliative care, hospice care and the concept of hospice, other end-of-life care options, power of attorney for health care decisions, living wills, and physician orders for life-sustaining treatment were also discussed with the patient and family.  Total face to face time 16 minutes. -patient agrees with DNR     Status is: Inpatient  Remains inpatient appropriate because:IV treatments appropriate due to intensity of illness or inability to take PO   Dispo: The patient is from: Home  Anticipated d/c is to: Home  Anticipated d/c date is: 2 days  Patient currently is not medically stable to d/c.              Difficult to place patient No        Family Communication:  no Family at bedside  Consultants:  none  Code Status:  FULL   DVT Prophylaxis: [place] Lovenox   Procedures: As Listed in Progress Note Above  Antibiotics: None       Subjective: Patient states breathing is not much better than yesterday.  Denies f/c, n/v/d. He has sob with minimal exertion.  He has cp with cough, but denies andy  hemoptysis  Objective: Vitals:   04/21/20 2155 04/22/20 0223 04/22/20 0525 04/22/20 0832  BP: 96/62 102/83 107/71   Pulse: 100 91 79    Resp: 16 20 20    Temp: 98.2 F (36.8 C) 98.4 F (36.9 C) (!) 97.5 F (36.4 C)   TempSrc: Oral Oral Oral   SpO2: (!) 89% 98% 90% 94%  Weight:      Height:        Intake/Output Summary (Last 24 hours) at 04/22/2020 1109 Last data filed at 04/22/2020 1029 Gross per 24 hour  Intake 1490.87 ml  Output 1800 ml  Net -309.13 ml   Weight change: 2.881 kg Exam:   General:  Pt is alert, follows commands appropriately, not in acute distress  HEENT: No icterus, No thrush, No neck mass, [place]/AT  Cardiovascular: RRR, S1/S2, no rubs, no gallops  Respiratory: diminished BS;  bilateral rales; no wheeze  Abdomen: Soft/+BS, non tender, non distended, no guarding  Extremities: No edema, No lymphangitis, No petechiae, No rashes, no synovitis   Data Reviewed: I have personally reviewed following labs and imaging studies Basic Metabolic Panel: Recent Labs  Lab 04/20/20 2001 04/21/20 0515 04/22/20 0605  NA 127* 125* 133*  K 4.3 3.6 4.6  CL 86* 87* 94*  CO2 28 24 29   GLUCOSE 133* 174* 136*  BUN 13 14 11   CREATININE 0.74 0.67 0.63  CALCIUM 9.2 8.6* 8.8*  MG  --  1.5* 1.7  PHOS  --  4.1 2.9   Liver Function Tests: Recent Labs  Lab 04/20/20 2001 04/21/20 0515 04/22/20 0605  AST 62* 61* 126*  ALT 8 16 19   ALKPHOS 76 67 65  BILITOT 0.8 1.1 0.5  PROT 7.1 6.8 6.6  ALBUMIN 3.4* 3.0* 3.0*   No results for input(s): LIPASE, AMYLASE in the last 168 hours. No results for input(s): AMMONIA in the last 168 hours. Coagulation Profile: No results for input(s): INR, PROTIME in the last 168 hours. CBC: Recent Labs  Lab 04/20/20 2001 04/21/20 0515 04/22/20 0605  WBC 7.0 4.8 4.8  NEUTROABS 5.1 4.1 3.6  HGB 11.5* 10.9* 10.2*  HCT 32.2* 30.3* 29.5*  MCV 87.3 86.6 88.1  PLT 224 247 305   Cardiac Enzymes: No results for input(s): CKTOTAL, CKMB, CKMBINDEX, TROPONINI in the last 168 hours. BNP: Invalid input(s): POCBNP CBG: Recent Labs  Lab 04/21/20 0813 04/21/20 1149  04/21/20 1752 04/21/20 2146 04/22/20 0810  GLUCAP 108* 212* 117* 169* 106*   HbA1C: No results for input(s): HGBA1C in the last 72 hours. Urine analysis:    Component Value Date/Time   COLORURINE YELLOW 10/07/2018 0923   APPEARANCEUR HAZY (A) 10/07/2018 0923   LABSPEC 1.005 10/07/2018 0923   PHURINE 7.0 10/07/2018 0923   GLUCOSEU NEGATIVE 10/07/2018 0923   HGBUR NEGATIVE 10/07/2018 0923   BILIRUBINUR NEGATIVE 10/07/2018 0923   KETONESUR NEGATIVE 10/07/2018 0923   PROTEINUR NEGATIVE 10/07/2018 0923   NITRITE NEGATIVE 10/07/2018 0923   LEUKOCYTESUR NEGATIVE 10/07/2018 0923   Sepsis Labs: @LABRCNTIP (procalcitonin:4,lacticidven:4) ) Recent Results (from the past 240 hour(s))  Resp Panel by RT-PCR (Flu A&B, Covid) Nasopharyngeal Swab     Status: Abnormal   Collection Time: 04/20/20  7:52 PM   Specimen: Nasopharyngeal Swab; Nasopharyngeal(NP) swabs in vial transport medium  Result Value Ref Range Status   SARS Coronavirus 2 by RT PCR POSITIVE (A) NEGATIVE Final    Comment: CRITICAL RESULT CALLED TO, READ BACK BY AND VERIFIED WITH: TURNER,C AT 2145 ON 2.11.22 BY ISLEY,B (NOTE) SARS-CoV-2 target  nucleic acids are DETECTED.  The SARS-CoV-2 RNA is generally detectable in upper respiratory specimens during the acute phase of infection. Positive results are indicative of the presence of the identified virus, but do not rule out bacterial infection or co-infection with other pathogens not detected by the test. Clinical correlation with patient history and other diagnostic information is necessary to determine patient infection status. The expected result is Negative.  Fact Sheet for Patients: EntrepreneurPulse.com.au  Fact Sheet for Healthcare Providers: IncredibleEmployment.be  This test is not yet approved or cleared by the [place] FDA and  has been authorized for detection and/or diagnosis of SARS-CoV-2 by FDA under an Emergency Use  Authorization (EUA).  This EUA will remain in effect (meaning th is test can be used) for the duration of  the COVID-19 declaration under Section 564(b)(1) of the Act, 21 U.S.C. section 360bbb-3(b)(1), unless the authorization is terminated or revoked sooner.     Influenza A by PCR NEGATIVE NEGATIVE Final   Influenza B by PCR NEGATIVE NEGATIVE Final    Comment: (NOTE) The Xpert Xpress SARS-CoV-2/FLU/RSV plus assay is intended as an aid in the diagnosis of influenza from Nasopharyngeal swab specimens and should not be used as a sole basis for treatment. Nasal washings and aspirates are unacceptable for Xpert Xpress SARS-CoV-2/FLU/RSV testing.  Fact Sheet for Patients: EntrepreneurPulse.com.au  Fact Sheet for Healthcare Providers: IncredibleEmployment.be  This test is not yet approved or cleared by the [place] FDA and has been authorized for detection and/or diagnosis of SARS-CoV-2 by FDA under an Emergency Use Authorization (EUA). This EUA will remain in effect (meaning this test can be used) for the duration of the COVID-19 declaration under Section 564(b)(1) of the Act, 21 U.S.C. section 360bbb-3(b)(1), unless the authorization is terminated or revoked.  Performed at [facility], [address], [place],  [address]      Scheduled Meds: . albuterol  2 puff Inhalation Q6H WA  . vitamin C  500 mg Oral Daily  . aspirin  81 mg Oral Daily  . atorvastatin  40 mg Oral Daily  . baricitinib  4 mg Oral Daily  . carbidopa-levodopa  1 tablet Oral QID  . enoxaparin (LOVENOX) injection  40 mg Subcutaneous QHS  . feeding supplement (GLUCERNA SHAKE)  237 mL Oral TID BM  . insulin aspart  0-15 Units Subcutaneous TID WC  . insulin aspart  0-5 Units Subcutaneous QHS  . loratadine  10 mg Oral Daily  . methylPREDNISolone (SOLU-MEDROL) injection  80 mg Intravenous Q12H  . pantoprazole (PROTONIX) IV  40 mg Intravenous QHS  . PARoxetine  20 mg  Oral Daily  . phenytoin  100 mg Oral TID  . rOPINIRole  0.5 mg Oral BID  . zinc sulfate  220 mg Oral Daily   Continuous Infusions: . remdesivir 100 mg in NS 100 mL 100 mg (04/22/20 1029)    Procedures/Studies: DG Chest Port 1 View  Result Date: 04/20/2020 CLINICAL DATA:  Cough and shortness of breath.  Weakness. EXAM: PORTABLE CHEST 1 VIEW COMPARISON:  10/07/2018 FINDINGS: Lung volumes are low. Patchy heterogeneous bilateral airspace opacities in a peripheral predominant distribution. Normal heart size for technique. Unchanged mediastinal contours. No pneumothorax or large pleural effusion. No evidence of pneumomediastinum. No acute osseous abnormalities are seen. IMPRESSION: Patchy heterogeneous bilateral airspace opacities in a peripheral predominant distribution, suspicious for multifocal pneumonia, pattern often seen with COVID 19. Electronically Signed   By: Keith Rake M.D.   On: 04/20/2020 19:58  Orson Eva, DO  [facility]  If 7PM-7AM, please contact night-coverage [email or web address] Password [email or web address] 04/22/2020, 11:09 AM   LOS: 2 days

## 2020-04-22 NOTE — Progress Notes (Signed)
Received call from telemetry. Pt had an elevation in HR. Non-sustained. Patient is lying on left side with eyes closed. No c/o pain or discomfort voiced at this time. Sinus rhythm at this time.

## 2020-04-23 DIAGNOSIS — U071 COVID-19: Secondary | ICD-10-CM | POA: Diagnosis not present

## 2020-04-23 DIAGNOSIS — G7112 Myotonia congenita: Secondary | ICD-10-CM | POA: Diagnosis not present

## 2020-04-23 DIAGNOSIS — E871 Hypo-osmolality and hyponatremia: Secondary | ICD-10-CM | POA: Diagnosis not present

## 2020-04-23 DIAGNOSIS — E1165 Type 2 diabetes mellitus with hyperglycemia: Secondary | ICD-10-CM | POA: Diagnosis not present

## 2020-04-23 LAB — CBC WITH DIFFERENTIAL/PLATELET
Abs Immature Granulocytes: 0.06 10*3/uL (ref 0.00–0.07)
Basophils Absolute: 0 10*3/uL (ref 0.0–0.1)
Basophils Relative: 0 %
Eosinophils Absolute: 0 10*3/uL (ref 0.0–0.5)
Eosinophils Relative: 1 %
HCT: 28.4 % — ABNORMAL LOW (ref 39.0–52.0)
Hemoglobin: 9.9 g/dL — ABNORMAL LOW (ref 13.0–17.0)
Immature Granulocytes: 1 %
Lymphocytes Relative: 11 %
Lymphs Abs: 0.7 10*3/uL (ref 0.7–4.0)
MCH: 31.1 pg (ref 26.0–34.0)
MCHC: 34.9 g/dL (ref 30.0–36.0)
MCV: 89.3 fL (ref 80.0–100.0)
Monocytes Absolute: 0.6 10*3/uL (ref 0.1–1.0)
Monocytes Relative: 9 %
Neutro Abs: 5.1 10*3/uL (ref 1.7–7.7)
Neutrophils Relative %: 78 %
Platelets: 337 10*3/uL (ref 150–400)
RBC: 3.18 MIL/uL — ABNORMAL LOW (ref 4.22–5.81)
RDW: 12.2 % (ref 11.5–15.5)
WBC: 6.6 10*3/uL (ref 4.0–10.5)
nRBC: 0 % (ref 0.0–0.2)

## 2020-04-23 LAB — COMPREHENSIVE METABOLIC PANEL
ALT: 18 U/L (ref 0–44)
AST: 144 U/L — ABNORMAL HIGH (ref 15–41)
Albumin: 2.8 g/dL — ABNORMAL LOW (ref 3.5–5.0)
Alkaline Phosphatase: 63 U/L (ref 38–126)
Anion gap: 9 (ref 5–15)
BUN: 12 mg/dL (ref 8–23)
CO2: 29 mmol/L (ref 22–32)
Calcium: 8.8 mg/dL — ABNORMAL LOW (ref 8.9–10.3)
Chloride: 95 mmol/L — ABNORMAL LOW (ref 98–111)
Creatinine, Ser: 0.54 mg/dL — ABNORMAL LOW (ref 0.61–1.24)
GFR, Estimated: >60 mL/min (ref >60)
Glucose, Bld: 159 mg/dL — ABNORMAL HIGH (ref 70–99)
Potassium: 4.8 mmol/L (ref 3.5–5.1)
Sodium: 133 mmol/L — ABNORMAL LOW (ref 135–145)
Total Bilirubin: 0.3 mg/dL (ref 0.3–1.2)
Total Protein: 6.5 g/dL (ref 6.5–8.1)

## 2020-04-23 LAB — C-REACTIVE PROTEIN: CRP: 27.8 mg/dL — ABNORMAL HIGH (ref <1.0)

## 2020-04-23 LAB — GLUCOSE, CAPILLARY
Glucose-Capillary: 98 mg/dL (ref 70–99)
Glucose-Capillary: 171 mg/dL — ABNORMAL HIGH (ref 70–99)
Glucose-Capillary: 122 mg/dL — ABNORMAL HIGH (ref 70–99)

## 2020-04-23 LAB — D-DIMER, QUANTITATIVE: D-Dimer, Quant: 1.18 ug/mL-FEU — ABNORMAL HIGH (ref 0.00–0.50)

## 2020-04-23 LAB — MAGNESIUM: Magnesium: 2 mg/dL (ref 1.7–2.4)

## 2020-04-23 LAB — PHOSPHORUS: Phosphorus: 3.4 mg/dL (ref 2.5–4.6)

## 2020-04-23 LAB — FERRITIN: Ferritin: 402 ng/mL — ABNORMAL HIGH (ref 24–336)

## 2020-04-23 MED ORDER — DIPHENHYDRAMINE HCL 50 MG/ML IJ SOLN
12.5000 mg | Freq: Once | INTRAMUSCULAR | Status: AC
  Administered 2020-04-23: 12.5 mg via INTRAVENOUS
  Filled 2020-04-23: qty 1

## 2020-04-23 MED ORDER — PROCHLORPERAZINE EDISYLATE 10 MG/2ML IJ SOLN
10.0000 mg | Freq: Once | INTRAMUSCULAR | Status: AC
  Administered 2020-04-23: 10 mg via INTRAVENOUS
  Filled 2020-04-23: qty 2

## 2020-04-23 NOTE — Progress Notes (Signed)
PROGRESS NOTE  Danny Hampton YKZ:[id number] DOB: 06/25/1955 DOA: 04/20/2020 PCP: Alda Berthold, DO   Brief History:  65 year old male with a history of hypertension, diabetes mellitus type 2, myotonia congenita, restless leg syndrome, and anxiety presenting with 10-day history of shortness of breath, coughing, headache, and anorexia with generalized weakness.  The patient has had some subjective fevers and chills.  He states that his symptoms worsened on 04/16/2020 to the point where he was having trouble even getting out of bed.  He had some nausea but denied any vomiting, diarrhea, chest pain, hemoptysis, hematochezia, melena.  Because of his worsening symptoms he presented for further evaluation.  In the emergency department, the patient was afebrile and hemodynamically stable albeit with soft blood pressures.  Oxygen saturation was 80% on room air.  He was placed on 5 L nasal cannula with oxygen saturation 95-96%.  BMP showed a sodium 127, serum creatinine 0.74, potassium 4.3.  WBC 7.0, hemoglobin 11.5, platelets 224K.  Covid RT-PCR was positive.  Chest x-ray showed bilateral patchy infiltrates.  The patient was started on steroids and remdesivir.  He is not vaccinated for COVID-19  Assessment/Plan: Acute respiratory failure with hypoxia due to COVID-19 pneumonia -Oxygen saturation 80% on room air on presentation -Currently stable on 5 L>>10L HFNC>>8L -Continue remdesivir and steroids -increase solumedrol to 1mg /kg -CRP 38.6>>30.7>>27.8 -D-dimer 1.37>>1.14>>1.20>>1.18 -Ferritin 295>>431>>402 -PCT 0.16 -CTA chest--neg PE; bilat GGO; mild mediastinal LN -added baricitinb 2/13 -prone positioning as much as pt can tolerate  Hyponatremia -Secondary to volume depletion and poor solute intake -Start IV normal saline--improving  Diabetes mellitus type 2 -Holding Metformin -Hemoglobin A1c--6.7 -NovoLog sliding scale  Essential hypertension -Holding metoprolol  succinate and lisinopril/HCTZ secondary to soft blood pressures  Restless leg syndrome -Continue home dose of ropinirole 0.5 mg at 3 PM, 1 mg at 9 PM -Continue home dose carbidopa levodopa48.75/195mg  to three times daily.  Myotonia congenita -Continue Dilantin 100 mg 3 times daily  Hyperlipidemia -Continue statin  Depression/anxiety -Restart Paxil -Restart home dose clonazepam -Restart Atarax  GERD -Continue PPI    Status is: Inpatient  Remains inpatient appropriate because:IV treatments appropriate due to intensity of illness or inability to take PO   Dispo: The patient is from: Home  Anticipated d/c is to: Home  Anticipated d/c date is: 2 days  Patient currently is not medically stable to d/c.              Difficult to place patient No        Family Communication:  no Family at bedside  Consultants:  none  Code Status:  FULL   DVT Prophylaxis: [place] Lovenox   Procedures: As Listed in Progress Note Above  Antibiotics: None     Subjective: Patient denies fevers, chills, headache, chest pain, dyspnea, nausea, vomiting, diarrhea, abdominal pain, dysuria, hematuria, hematochezia, and melena.   Objective: Vitals:   04/22/20 2011 04/22/20 2013 04/23/20 0551 04/23/20 0815  BP:  117/71 117/81   Pulse:  78 72   Resp:  18 18   Temp:  (!) 97.4 F (36.3 C) 98 F (36.7 C)   TempSrc:  Oral Oral   SpO2: 90% 90% 96% 94%  Weight:      Height:        Intake/Output Summary (Last 24 hours) at 04/23/2020 0958 Last data filed at 04/22/2020 2145 Gross per 24 hour  Intake 1110 ml  Output 500 ml  Net 610 ml  Weight change:  Exam:   General:  Pt is alert, follows commands appropriately, not in acute distress  HEENT: No icterus, No thrush, No neck mass, [place]/AT  Cardiovascular: RRR, S1/S2, no rubs, no gallops  Respiratory: bibasilar crackles. No wheeze  Abdomen: Soft/+BS, non tender, non  distended, no guarding  Extremities: No edema, No lymphangitis, No petechiae, No rashes, no synovitis   Data Reviewed: I have personally reviewed following labs and imaging studies Basic Metabolic Panel: Recent Labs  Lab 04/20/20 2001 04/21/20 0515 04/22/20 0605 04/23/20 0533  NA 127* 125* 133* 133*  K 4.3 3.6 4.6 4.8  CL 86* 87* 94* 95*  CO2 28 24 29 29   GLUCOSE 133* 174* 136* 159*  BUN 13 14 11 12   CREATININE 0.74 0.67 0.63 0.54*  CALCIUM 9.2 8.6* 8.8* 8.8*  MG  --  1.5* 1.7 2.0  PHOS  --  4.1 2.9 3.4   Liver Function Tests: Recent Labs  Lab 04/20/20 2001 04/21/20 0515 04/22/20 0605 04/23/20 0533  AST 62* 61* 126* 144*  ALT 8 16 19 18   ALKPHOS 76 67 65 63  BILITOT 0.8 1.1 0.5 0.3  PROT 7.1 6.8 6.6 6.5  ALBUMIN 3.4* 3.0* 3.0* 2.8*   No results for input(s): LIPASE, AMYLASE in the last 168 hours. No results for input(s): AMMONIA in the last 168 hours. Coagulation Profile: No results for input(s): INR, PROTIME in the last 168 hours. CBC: Recent Labs  Lab 04/20/20 2001 04/21/20 0515 04/22/20 0605 04/23/20 0533  WBC 7.0 4.8 4.8 6.6  NEUTROABS 5.1 4.1 3.6 5.1  HGB 11.5* 10.9* 10.2* 9.9*  HCT 32.2* 30.3* 29.5* 28.4*  MCV 87.3 86.6 88.1 89.3  PLT 224 247 305 337   Cardiac Enzymes: No results for input(s): CKTOTAL, CKMB, CKMBINDEX, TROPONINI in the last 168 hours. BNP: Invalid input(s): POCBNP CBG: Recent Labs  Lab 04/22/20 0810 04/22/20 1142 04/22/20 1615 04/22/20 2133 04/23/20 0742  GLUCAP 106* 177* 202* 132* 98   HbA1C: Recent Labs    04/22/20 0605  HGBA1C 6.7*   Urine analysis:    Component Value Date/Time   COLORURINE YELLOW 10/07/2018 0923   APPEARANCEUR HAZY (A) 10/07/2018 0923   LABSPEC 1.005 10/07/2018 0923   PHURINE 7.0 10/07/2018 0923   GLUCOSEU NEGATIVE 10/07/2018 0923   HGBUR NEGATIVE 10/07/2018 0923   BILIRUBINUR NEGATIVE 10/07/2018 0923   KETONESUR NEGATIVE 10/07/2018 0923   PROTEINUR NEGATIVE 10/07/2018 0923   NITRITE  NEGATIVE 10/07/2018 0923   LEUKOCYTESUR NEGATIVE 10/07/2018 0923   Sepsis Labs: @LABRCNTIP (procalcitonin:4,lacticidven:4) ) Recent Results (from the past 240 hour(s))  Resp Panel by RT-PCR (Flu A&B, Covid) Nasopharyngeal Swab     Status: Abnormal   Collection Time: 04/20/20  7:52 PM   Specimen: Nasopharyngeal Swab; Nasopharyngeal(NP) swabs in vial transport medium  Result Value Ref Range Status   SARS Coronavirus 2 by RT PCR POSITIVE (A) NEGATIVE Final    Comment: CRITICAL RESULT CALLED TO, READ BACK BY AND VERIFIED WITH: TURNER,C AT 2145 ON 2.11.22 BY ISLEY,B (NOTE) SARS-CoV-2 target nucleic acids are DETECTED.  The SARS-CoV-2 RNA is generally detectable in upper respiratory specimens during the acute phase of infection. Positive results are indicative of the presence of the identified virus, but do not rule out bacterial infection or co-infection with other pathogens not detected by the test. Clinical correlation with patient history and other diagnostic information is necessary to determine patient infection status. The expected result is Negative.  Fact Sheet for Patients: EntrepreneurPulse.com.au  Fact Sheet for Healthcare Providers:  IncredibleEmployment.be  This test is not yet approved or cleared by the [place] and  has been authorized for detection and/or diagnosis of SARS-CoV-2 by FDA under an Emergency Use Authorization (EUA).  This EUA will remain in effect (meaning th is test can be used) for the duration of  the COVID-19 declaration under Section 564(b)(1) of the Act, 21 U.S.C. section 360bbb-3(b)(1), unless the authorization is terminated or revoked sooner.     Influenza A by PCR NEGATIVE NEGATIVE Final   Influenza B by PCR NEGATIVE NEGATIVE Final    Comment: (NOTE) The Xpert Xpress SARS-CoV-2/FLU/RSV plus assay is intended as an aid in the diagnosis of influenza from Nasopharyngeal swab specimens and should not  be used as a sole basis for treatment. Nasal washings and aspirates are unacceptable for Xpert Xpress SARS-CoV-2/FLU/RSV testing.  Fact Sheet for Patients: EntrepreneurPulse.com.au  Fact Sheet for Healthcare Providers: IncredibleEmployment.be  This test is not yet approved or cleared by the [place] FDA and has been authorized for detection and/or diagnosis of SARS-CoV-2 by FDA under an Emergency Use Authorization (EUA). This EUA will remain in effect (meaning this test can be used) for the duration of the COVID-19 declaration under Section 564(b)(1) of the Act, 21 U.S.C. section 360bbb-3(b)(1), unless the authorization is terminated or revoked.  Performed at [facility], [address], [place],  [address]      Scheduled Meds: . albuterol  2 puff Inhalation Q6H WA  . vitamin C  500 mg Oral Daily  . aspirin  81 mg Oral Daily  . atorvastatin  40 mg Oral Daily  . baricitinib  4 mg Oral Daily  . carbidopa-levodopa  1 tablet Oral QID  . diphenhydrAMINE  12.5 mg Intravenous Once  . enoxaparin (LOVENOX) injection  40 mg Subcutaneous QHS  . feeding supplement (GLUCERNA SHAKE)  237 mL Oral TID BM  . insulin aspart  0-5 Units Subcutaneous QHS  . insulin aspart  0-9 Units Subcutaneous TID WC  . loratadine  10 mg Oral Daily  . methylPREDNISolone (SOLU-MEDROL) injection  80 mg Intravenous Q12H  . pantoprazole (PROTONIX) IV  40 mg Intravenous QHS  . PARoxetine  20 mg Oral Daily  . phenytoin  100 mg Oral TID  . prochlorperazine  10 mg Intravenous Once  . rOPINIRole  0.5 mg Oral BID  . zinc sulfate  220 mg Oral Daily   Continuous Infusions: . remdesivir 100 mg in NS 100 mL 100 mg (04/23/20 0851)    Procedures/Studies: CT ANGIO CHEST PE W OR WO CONTRAST  Result Date: 04/22/2020 CLINICAL DATA:  Chest pain, shortness of breath, hypoxia. COVID positive. Cough and dyspnea. EXAM: CT ANGIOGRAPHY CHEST WITH CONTRAST TECHNIQUE: Multidetector CT  imaging of the chest was performed using the standard protocol during bolus administration of intravenous contrast. Multiplanar CT image reconstructions and MIPs were obtained to evaluate the vascular anatomy. CONTRAST:  59mL OMNIPAQUE IOHEXOL 350 MG/ML SOLN COMPARISON:  Chest radiograph 04/20/2020 FINDINGS: Cardiovascular: No filling defect is identified in the pulmonary arterial tree to suggest pulmonary embolus. Coronary, aortic arch, and branch vessel atherosclerotic vascular disease. Mild cardiomegaly. No pericardial effusion. Mediastinum/Nodes: There is a mildly prominent AP window lymph node measuring 1.1 cm in short axis on image 28 series 4. A right paratracheal node measures 1.4 cm in short axis on image 29 of series 4 and has a fatty hilum. Right hilar lymph node 1.3 cm in short axis, image 36 series 4. Lungs/Pleura: Bilateral ground-glass opacities are present with some  internal interstitial accentuation resembling a crazy paving appearance. In the context of the patient's positive COVID test, this likely represents bilateral COVID pneumonia. No pleural effusion. Upper Abdomen: Unremarkable Musculoskeletal: There some periapical lucencies along the remaining mandibular teeth. Lower cervical and thoracic spondylosis and degenerative disc disease. There is potentially some faint opacification of the posterior longitudinal ligament at the C7 level. Review of the MIP images confirms the above findings. IMPRESSION: 1. No filling defect is identified in the pulmonary arterial tree to suggest pulmonary embolus. 2. Bilateral ground-glass opacities with some internal interstitial accentuation resembling a crazy paving appearance. In the context of the patient's positive COVID test, this likely represents bilateral COVID pneumonia. 3. Mild cardiomegaly. 4. Coronary, aortic arch, and branch vessel atherosclerotic vascular disease. 5. Mild mediastinal and right hilar adenopathy, probably reactive. 6. Periapical  lucencies along the remaining mandibular teeth, compatible with tooth decay. 7. Lower cervical and thoracic spondylosis and degenerative disc disease. Possible faint opacification of the posterior longitudinal ligament at the C7 level. Aortic Atherosclerosis (ICD10-I70.0). Electronically Signed   By: Van Clines M.D.   On: 04/22/2020 14:17   DG Chest Port 1 View  Result Date: 04/20/2020 CLINICAL DATA:  Cough and shortness of breath.  Weakness. EXAM: PORTABLE CHEST 1 VIEW COMPARISON:  10/07/2018 FINDINGS: Lung volumes are low. Patchy heterogeneous bilateral airspace opacities in a peripheral predominant distribution. Normal heart size for technique. Unchanged mediastinal contours. No pneumothorax or large pleural effusion. No evidence of pneumomediastinum. No acute osseous abnormalities are seen. IMPRESSION: Patchy heterogeneous bilateral airspace opacities in a peripheral predominant distribution, suspicious for multifocal pneumonia, pattern often seen with COVID 19. Electronically Signed   By: Keith Rake M.D.   On: 04/20/2020 19:58    Orson Eva, DO  [facility]  If 7PM-7AM, please contact night-coverage [email or web address] Password [facility] 04/23/2020, 9:58 AM   LOS: 3 days

## 2020-04-24 DIAGNOSIS — U071 COVID-19: Secondary | ICD-10-CM | POA: Diagnosis not present

## 2020-04-24 DIAGNOSIS — E669 Obesity, unspecified: Secondary | ICD-10-CM | POA: Diagnosis not present

## 2020-04-24 DIAGNOSIS — E871 Hypo-osmolality and hyponatremia: Secondary | ICD-10-CM | POA: Diagnosis not present

## 2020-04-24 DIAGNOSIS — G7112 Myotonia congenita: Secondary | ICD-10-CM | POA: Diagnosis not present

## 2020-04-24 LAB — CBC WITH DIFFERENTIAL/PLATELET
Abs Immature Granulocytes: 0.07 10*3/uL (ref 0.00–0.07)
Basophils Absolute: 0 10*3/uL (ref 0.0–0.1)
Basophils Relative: 0 %
Eosinophils Absolute: 0 10*3/uL (ref 0.0–0.5)
Eosinophils Relative: 0 %
HCT: 30.3 % — ABNORMAL LOW (ref 39.0–52.0)
Hemoglobin: 10.1 g/dL — ABNORMAL LOW (ref 13.0–17.0)
Immature Granulocytes: 1 %
Lymphocytes Relative: 10 %
Lymphs Abs: 0.7 10*3/uL (ref 0.7–4.0)
MCH: 30.6 pg (ref 26.0–34.0)
MCHC: 33.3 g/dL (ref 30.0–36.0)
MCV: 91.8 fL (ref 80.0–100.0)
Monocytes Absolute: 0.4 10*3/uL (ref 0.1–1.0)
Monocytes Relative: 6 %
Neutro Abs: 5.8 10*3/uL (ref 1.7–7.7)
Neutrophils Relative %: 83 %
Platelets: 437 10*3/uL — ABNORMAL HIGH (ref 150–400)
RBC: 3.3 MIL/uL — ABNORMAL LOW (ref 4.22–5.81)
RDW: 12.4 % (ref 11.5–15.5)
WBC: 7.1 10*3/uL (ref 4.0–10.5)
nRBC: 0 % (ref 0.0–0.2)

## 2020-04-24 LAB — GLUCOSE, CAPILLARY
Glucose-Capillary: 110 mg/dL — ABNORMAL HIGH (ref 70–99)
Glucose-Capillary: 223 mg/dL — ABNORMAL HIGH (ref 70–99)
Glucose-Capillary: 169 mg/dL — ABNORMAL HIGH (ref 70–99)
Glucose-Capillary: 134 mg/dL — ABNORMAL HIGH (ref 70–99)

## 2020-04-24 LAB — COMPREHENSIVE METABOLIC PANEL
ALT: 16 U/L (ref 0–44)
AST: 131 U/L — ABNORMAL HIGH (ref 15–41)
Albumin: 3 g/dL — ABNORMAL LOW (ref 3.5–5.0)
Alkaline Phosphatase: 68 U/L (ref 38–126)
Anion gap: 9 (ref 5–15)
BUN: 12 mg/dL (ref 8–23)
CO2: 28 mmol/L (ref 22–32)
Calcium: 9.2 mg/dL (ref 8.9–10.3)
Chloride: 97 mmol/L — ABNORMAL LOW (ref 98–111)
Creatinine, Ser: 0.56 mg/dL — ABNORMAL LOW (ref 0.61–1.24)
GFR, Estimated: >60 mL/min (ref >60)
Glucose, Bld: 159 mg/dL — ABNORMAL HIGH (ref 70–99)
Potassium: 5.2 mmol/L — ABNORMAL HIGH (ref 3.5–5.1)
Sodium: 134 mmol/L — ABNORMAL LOW (ref 135–145)
Total Bilirubin: 0.3 mg/dL (ref 0.3–1.2)
Total Protein: 6.7 g/dL (ref 6.5–8.1)

## 2020-04-24 LAB — PHOSPHORUS: Phosphorus: 4.1 mg/dL (ref 2.5–4.6)

## 2020-04-24 LAB — D-DIMER, QUANTITATIVE: D-Dimer, Quant: 1.31 ug/mL-FEU — ABNORMAL HIGH (ref 0.00–0.50)

## 2020-04-24 LAB — FERRITIN: Ferritin: 363 ng/mL — ABNORMAL HIGH (ref 24–336)

## 2020-04-24 LAB — MAGNESIUM: Magnesium: 1.9 mg/dL (ref 1.7–2.4)

## 2020-04-24 LAB — C-REACTIVE PROTEIN: CRP: 19 mg/dL — ABNORMAL HIGH (ref <1.0)

## 2020-04-24 NOTE — Progress Notes (Signed)
PROGRESS NOTE  Danny Hampton SAY:[id number] DOB: 1955/05/31 DOA: 04/20/2020 PCP: Alda Berthold, DO  Brief History: 65 year old male with a history of hypertension, diabetes mellitus type 2, myotonia congenita, restless leg syndrome, and anxiety presenting with 10-day history of shortness of breath, coughing, headache, and anorexia with generalized weakness. The patient has had some subjective fevers and chills. He states that his symptoms worsened on 04/16/2020 to the point where he was having trouble even getting out of bed. He had some nausea but denied any vomiting, diarrhea, chest pain, hemoptysis, hematochezia, melena. Because of his worsening symptoms he presented for further evaluation. In the emergency department, the patient was afebrile and hemodynamically stable albeit with soft blood pressures. Oxygen saturation was 80% on room air. He was placed on 5 L nasal cannula with oxygen saturation 95-96%. BMP showed a sodium 127, serum creatinine 0.74, potassium 4.3. WBC 7.0, hemoglobin 11.5, platelets 224K.Covid RT-PCR was positive. Chest x-ray showed bilateral patchy infiltrates. The patient was started on steroids and remdesivir. He is not vaccinated for COVID-19  Assessment/Plan: Acute respiratory failure with hypoxia due to COVID-19 pneumonia -Oxygen saturation 80% on room airon presentation -Currently stable on 5 L>>10L HFNC>>8L>>6L -Continue remdesivir and steroids -increase solumedrol to 1mg /kg -CRP 38.6>>30.7>>27.8>>19.0 -D-dimer 1.37>>1.14>>1.20>>1.18>>1.31 -Ferritin 295>>431>>402>>363 -PCT 0.16 -CTA chest--neg PE; bilat GGO; mild mediastinal LN -added baricitinb 2/13 -prone positioning as much as pt can tolerate  Hyponatremia -Secondary to volume depletion and poor solute intake -Start IV normal saline--improved  Diabetes mellitus type 2 -Holding Metformin -Hemoglobin A1c--6.7 -NovoLog sliding scale  Essential hypertension -Holding  metoprolol succinate and lisinopril/HCTZ secondary to soft blood pressures  Restless leg syndrome -Continue home dose of ropinirole 0.5 mg at 3 PM, 1 mg at 9 PM -Continue home dose carbidopa levodopa48.75/195mg  to three times daily.  Myotonia congenita -Continue Dilantin 100 mg 3 times daily  Hyperlipidemia -Continue statin  Depression/anxiety -Restart Paxil -Restart home dose clonazepam -Restart Atarax  GERD -Continue PPI    Status is: Inpatient  Remains inpatient appropriate because:IV treatments appropriate due to intensity of illness or inability to take PO   Dispo: The patient is from:Home Anticipated d/c is AT:[facility] Anticipated d/c date is: 2/16 or 2/17 Patient currently is not medically stable to d/c. Difficult to place patient No        Family Communication:noFamily at bedside  Consultants:none  Code Status: FULL   DVT Prophylaxis: [place] Lovenox   Procedures: As Listed in Progress Note Above  Antibiotics: None     Subjective: Patient is breathing better.  He still has some dyspnea on exertion.  Patient denies fevers, chills, headache, chest pain,nausea, vomiting, diarrhea, abdominal pain, dysuria, hematuria, hematochezia, and melena.   Objective: Vitals:   04/24/20 0754 04/24/20 0758 04/24/20 0936 04/24/20 0947  BP:  131/66    Pulse:  71    Resp:  18    Temp:  97.8 F (36.6 C)    TempSrc:  Oral    SpO2: 94% 94% (!) 75% 92%  Weight:      Height:       No intake or output data in the 24 hours ending 04/24/20 1216 Weight change:  Exam:   General:  Pt is alert, follows commands appropriately, not in acute distress  HEENT: No icterus, No thrush, No neck mass, [place]/AT  Cardiovascular: RRR, S1/S2, no rubs, no gallops  Respiratory: bibasilar crackles. No wheeze  Abdomen: Soft/+BS, non tender, non distended, no guarding  Extremities:  No edema, No  lymphangitis, No petechiae, No rashes, no synovitis   Data Reviewed: I have personally reviewed following labs and imaging studies Basic Metabolic Panel: Recent Labs  Lab 04/20/20 2001 04/21/20 0515 04/22/20 0605 04/23/20 0533 04/24/20 0523  NA 127* 125* 133* 133* 134*  K 4.3 3.6 4.6 4.8 5.2*  CL 86* 87* 94* 95* 97*  CO2 28 24 29 29 28   GLUCOSE 133* 174* 136* 159* 159*  BUN 13 14 11 12 12   CREATININE 0.74 0.67 0.63 0.54* 0.56*  CALCIUM 9.2 8.6* 8.8* 8.8* 9.2  MG  --  1.5* 1.7 2.0 1.9  PHOS  --  4.1 2.9 3.4 4.1   Liver Function Tests: Recent Labs  Lab 04/20/20 2001 04/21/20 0515 04/22/20 0605 04/23/20 0533 04/24/20 0523  AST 62* 61* 126* 144* 131*  ALT 8 16 19 18 16   ALKPHOS 76 67 65 63 68  BILITOT 0.8 1.1 0.5 0.3 0.3  PROT 7.1 6.8 6.6 6.5 6.7  ALBUMIN 3.4* 3.0* 3.0* 2.8* 3.0*   No results for input(s): LIPASE, AMYLASE in the last 168 hours. No results for input(s): AMMONIA in the last 168 hours. Coagulation Profile: No results for input(s): INR, PROTIME in the last 168 hours. CBC: Recent Labs  Lab 04/20/20 2001 04/21/20 0515 04/22/20 0605 04/23/20 0533 04/24/20 0523  WBC 7.0 4.8 4.8 6.6 7.1  NEUTROABS 5.1 4.1 3.6 5.1 5.8  HGB 11.5* 10.9* 10.2* 9.9* 10.1*  HCT 32.2* 30.3* 29.5* 28.4* 30.3*  MCV 87.3 86.6 88.1 89.3 91.8  PLT 224 247 305 337 437*   Cardiac Enzymes: No results for input(s): CKTOTAL, CKMB, CKMBINDEX, TROPONINI in the last 168 hours. BNP: Invalid input(s): POCBNP CBG: Recent Labs  Lab 04/23/20 0742 04/23/20 1151 04/23/20 2156 04/24/20 0744 04/24/20 1154  GLUCAP 98 171* 122* 110* 223*   HbA1C: Recent Labs    04/22/20 0605  HGBA1C 6.7*   Urine analysis:    Component Value Date/Time   COLORURINE YELLOW 10/07/2018 0923   APPEARANCEUR HAZY (A) 10/07/2018 0923   LABSPEC 1.005 10/07/2018 0923   PHURINE 7.0 10/07/2018 0923   GLUCOSEU NEGATIVE 10/07/2018 0923   HGBUR NEGATIVE 10/07/2018 0923   BILIRUBINUR NEGATIVE 10/07/2018  0923   KETONESUR NEGATIVE 10/07/2018 0923   PROTEINUR NEGATIVE 10/07/2018 0923   NITRITE NEGATIVE 10/07/2018 0923   LEUKOCYTESUR NEGATIVE 10/07/2018 0923   Sepsis Labs: @LABRCNTIP (procalcitonin:4,lacticidven:4) ) Recent Results (from the past 240 hour(s))  Resp Panel by RT-PCR (Flu A&B, Covid) Nasopharyngeal Swab     Status: Abnormal   Collection Time: 04/20/20  7:52 PM   Specimen: Nasopharyngeal Swab; Nasopharyngeal(NP) swabs in vial transport medium  Result Value Ref Range Status   SARS Coronavirus 2 by RT PCR POSITIVE (A) NEGATIVE Final    Comment: CRITICAL RESULT CALLED TO, READ BACK BY AND VERIFIED WITH: TURNER,C AT 2145 ON 2.11.22 BY ISLEY,B (NOTE) SARS-CoV-2 target nucleic acids are DETECTED.  The SARS-CoV-2 RNA is generally detectable in upper respiratory specimens during the acute phase of infection. Positive results are indicative of the presence of the identified virus, but do not rule out bacterial infection or co-infection with other pathogens not detected by the test. Clinical correlation with patient history and other diagnostic information is necessary to determine patient infection status. The expected result is Negative.  Fact Sheet for Patients: EntrepreneurPulse.com.au  Fact Sheet for Healthcare Providers: IncredibleEmployment.be  This test is not yet approved or cleared by the [place] FDA and  has been authorized for detection and/or diagnosis of  SARS-CoV-2 by FDA under an Emergency Use Authorization (EUA).  This EUA will remain in effect (meaning th is test can be used) for the duration of  the COVID-19 declaration under Section 564(b)(1) of the Act, 21 U.S.C. section 360bbb-3(b)(1), unless the authorization is terminated or revoked sooner.     Influenza A by PCR NEGATIVE NEGATIVE Final   Influenza B by PCR NEGATIVE NEGATIVE Final    Comment: (NOTE) The Xpert Xpress SARS-CoV-2/FLU/RSV plus assay is intended  as an aid in the diagnosis of influenza from Nasopharyngeal swab specimens and should not be used as a sole basis for treatment. Nasal washings and aspirates are unacceptable for Xpert Xpress SARS-CoV-2/FLU/RSV testing.  Fact Sheet for Patients: EntrepreneurPulse.com.au  Fact Sheet for Healthcare Providers: IncredibleEmployment.be  This test is not yet approved or cleared by the [place] FDA and has been authorized for detection and/or diagnosis of SARS-CoV-2 by FDA under an Emergency Use Authorization (EUA). This EUA will remain in effect (meaning this test can be used) for the duration of the COVID-19 declaration under Section 564(b)(1) of the Act, 21 U.S.C. section 360bbb-3(b)(1), unless the authorization is terminated or revoked.  Performed at [facility], [address], [place], [place] [address]      Scheduled Meds: . albuterol  2 puff Inhalation Q6H WA  . vitamin C  500 mg Oral Daily  . aspirin  81 mg Oral Daily  . atorvastatin  40 mg Oral Daily  . baricitinib  4 mg Oral Daily  . carbidopa-levodopa  1 tablet Oral QID  . enoxaparin (LOVENOX) injection  40 mg Subcutaneous QHS  . feeding supplement (GLUCERNA SHAKE)  237 mL Oral TID BM  . insulin aspart  0-5 Units Subcutaneous QHS  . insulin aspart  0-9 Units Subcutaneous TID WC  . loratadine  10 mg Oral Daily  . methylPREDNISolone (SOLU-MEDROL) injection  80 mg Intravenous Q12H  . pantoprazole (PROTONIX) IV  40 mg Intravenous QHS  . PARoxetine  20 mg Oral Daily  . phenytoin  100 mg Oral TID  . rOPINIRole  0.5 mg Oral BID  . zinc sulfate  220 mg Oral Daily   Continuous Infusions:  Procedures/Studies: CT ANGIO CHEST PE W OR WO CONTRAST  Result Date: 04/22/2020 CLINICAL DATA:  Chest pain, shortness of breath, hypoxia. COVID positive. Cough and dyspnea. EXAM: CT ANGIOGRAPHY CHEST WITH CONTRAST TECHNIQUE: Multidetector CT imaging of the chest was performed using the standard  protocol during bolus administration of intravenous contrast. Multiplanar CT image reconstructions and MIPs were obtained to evaluate the vascular anatomy. CONTRAST:  82mL OMNIPAQUE IOHEXOL 350 MG/ML SOLN COMPARISON:  Chest radiograph 04/20/2020 FINDINGS: Cardiovascular: No filling defect is identified in the pulmonary arterial tree to suggest pulmonary embolus. Coronary, aortic arch, and branch vessel atherosclerotic vascular disease. Mild cardiomegaly. No pericardial effusion. Mediastinum/Nodes: There is a mildly prominent AP window lymph node measuring 1.1 cm in short axis on image 28 series 4. A right paratracheal node measures 1.4 cm in short axis on image 29 of series 4 and has a fatty hilum. Right hilar lymph node 1.3 cm in short axis, image 36 series 4. Lungs/Pleura: Bilateral ground-glass opacities are present with some internal interstitial accentuation resembling a crazy paving appearance. In the context of the patient's positive COVID test, this likely represents bilateral COVID pneumonia. No pleural effusion. Upper Abdomen: Unremarkable Musculoskeletal: There some periapical lucencies along the remaining mandibular teeth. Lower cervical and thoracic spondylosis and degenerative disc disease. There is potentially some faint opacification of  the posterior longitudinal ligament at the C7 level. Review of the MIP images confirms the above findings. IMPRESSION: 1. No filling defect is identified in the pulmonary arterial tree to suggest pulmonary embolus. 2. Bilateral ground-glass opacities with some internal interstitial accentuation resembling a crazy paving appearance. In the context of the patient's positive COVID test, this likely represents bilateral COVID pneumonia. 3. Mild cardiomegaly. 4. Coronary, aortic arch, and branch vessel atherosclerotic vascular disease. 5. Mild mediastinal and right hilar adenopathy, probably reactive. 6. Periapical lucencies along the remaining mandibular teeth, compatible  with tooth decay. 7. Lower cervical and thoracic spondylosis and degenerative disc disease. Possible faint opacification of the posterior longitudinal ligament at the C7 level. Aortic Atherosclerosis (ICD10-I70.0). Electronically Signed   By: Van Clines M.D.   On: 04/22/2020 14:17   DG Chest Port 1 View  Result Date: 04/20/2020 CLINICAL DATA:  Cough and shortness of breath.  Weakness. EXAM: PORTABLE CHEST 1 VIEW COMPARISON:  10/07/2018 FINDINGS: Lung volumes are low. Patchy heterogeneous bilateral airspace opacities in a peripheral predominant distribution. Normal heart size for technique. Unchanged mediastinal contours. No pneumothorax or large pleural effusion. No evidence of pneumomediastinum. No acute osseous abnormalities are seen. IMPRESSION: Patchy heterogeneous bilateral airspace opacities in a peripheral predominant distribution, suspicious for multifocal pneumonia, pattern often seen with COVID 19. Electronically Signed   By: Keith Rake M.D.   On: 04/20/2020 19:58    Orson Eva, DO  [facility]  If 7PM-7AM, please contact night-coverage [email or web address] Password [email or web address] 04/24/2020, 12:16 PM   LOS: 4 days

## 2020-04-24 NOTE — Progress Notes (Signed)
Walked into patient's room after he finished breakfast, I had turned his oxygen down prior (about 1 hour ago). Checked O2 sat and it was 75%, he seemed a little SOB. Turned O2 up to 6 liters and pt. Relaxed back in bed and within minutes pt. O2 sat was back up to 92%.

## 2020-04-24 NOTE — Progress Notes (Signed)
Alert and oriented x 4. Able to make needs known and independent with adls. Ambulates self in room without ambulatory aides. C/o back pain, Tylenol given with relief. Continues on 6 l/mi oxygen use. Incentive spirometry and pulmonary hygiene encouraged. No other concerns voiced. Continue with current plan.

## 2020-04-25 DIAGNOSIS — J9601 Acute respiratory failure with hypoxia: Secondary | ICD-10-CM | POA: Diagnosis not present

## 2020-04-25 DIAGNOSIS — J1282 Pneumonia due to coronavirus disease 2019: Secondary | ICD-10-CM

## 2020-04-25 DIAGNOSIS — U071 COVID-19: Principal | ICD-10-CM

## 2020-04-25 DIAGNOSIS — K219 Gastro-esophageal reflux disease without esophagitis: Secondary | ICD-10-CM

## 2020-04-25 DIAGNOSIS — G7112 Myotonia congenita: Secondary | ICD-10-CM

## 2020-04-25 DIAGNOSIS — E871 Hypo-osmolality and hyponatremia: Secondary | ICD-10-CM

## 2020-04-25 DIAGNOSIS — E1165 Type 2 diabetes mellitus with hyperglycemia: Secondary | ICD-10-CM | POA: Diagnosis not present

## 2020-04-25 LAB — CBC WITH DIFFERENTIAL/PLATELET
Abs Immature Granulocytes: 0.15 10*3/uL — ABNORMAL HIGH (ref 0.00–0.07)
Basophils Absolute: 0 10*3/uL (ref 0.0–0.1)
Basophils Relative: 0 %
Eosinophils Absolute: 0.1 10*3/uL (ref 0.0–0.5)
Eosinophils Relative: 1 %
HCT: 30.4 % — ABNORMAL LOW (ref 39.0–52.0)
Hemoglobin: 10.2 g/dL — ABNORMAL LOW (ref 13.0–17.0)
Immature Granulocytes: 2 %
Lymphocytes Relative: 12 %
Lymphs Abs: 0.9 10*3/uL (ref 0.7–4.0)
MCH: 30.7 pg (ref 26.0–34.0)
MCHC: 33.6 g/dL (ref 30.0–36.0)
MCV: 91.6 fL (ref 80.0–100.0)
Monocytes Absolute: 0.5 10*3/uL (ref 0.1–1.0)
Monocytes Relative: 6 %
Neutro Abs: 6.1 10*3/uL (ref 1.7–7.7)
Neutrophils Relative %: 79 %
Platelets: 485 10*3/uL — ABNORMAL HIGH (ref 150–400)
RBC: 3.32 MIL/uL — ABNORMAL LOW (ref 4.22–5.81)
RDW: 12.4 % (ref 11.5–15.5)
WBC: 7.7 10*3/uL (ref 4.0–10.5)
nRBC: 0 % (ref 0.0–0.2)

## 2020-04-25 LAB — C-REACTIVE PROTEIN: CRP: 14.2 mg/dL — ABNORMAL HIGH (ref <1.0)

## 2020-04-25 LAB — GLUCOSE, CAPILLARY
Glucose-Capillary: 104 mg/dL — ABNORMAL HIGH (ref 70–99)
Glucose-Capillary: 146 mg/dL — ABNORMAL HIGH (ref 70–99)
Glucose-Capillary: 137 mg/dL — ABNORMAL HIGH (ref 70–99)
Glucose-Capillary: 126 mg/dL — ABNORMAL HIGH (ref 70–99)

## 2020-04-25 LAB — COMPREHENSIVE METABOLIC PANEL
ALT: 25 U/L (ref 0–44)
AST: 174 U/L — ABNORMAL HIGH (ref 15–41)
Albumin: 2.9 g/dL — ABNORMAL LOW (ref 3.5–5.0)
Alkaline Phosphatase: 68 U/L (ref 38–126)
Anion gap: 9 (ref 5–15)
BUN: 14 mg/dL (ref 8–23)
CO2: 27 mmol/L (ref 22–32)
Calcium: 9.2 mg/dL (ref 8.9–10.3)
Chloride: 98 mmol/L (ref 98–111)
Creatinine, Ser: 0.57 mg/dL — ABNORMAL LOW (ref 0.61–1.24)
GFR, Estimated: >60 mL/min (ref >60)
Glucose, Bld: 165 mg/dL — ABNORMAL HIGH (ref 70–99)
Potassium: 5 mmol/L (ref 3.5–5.1)
Sodium: 134 mmol/L — ABNORMAL LOW (ref 135–145)
Total Bilirubin: 0.4 mg/dL (ref 0.3–1.2)
Total Protein: 6.6 g/dL (ref 6.5–8.1)

## 2020-04-25 LAB — MAGNESIUM: Magnesium: 1.7 mg/dL (ref 1.7–2.4)

## 2020-04-25 LAB — D-DIMER, QUANTITATIVE: D-Dimer, Quant: 1.25 ug/mL-FEU — ABNORMAL HIGH (ref 0.00–0.50)

## 2020-04-25 LAB — PHOSPHORUS: Phosphorus: 4.2 mg/dL (ref 2.5–4.6)

## 2020-04-25 LAB — FERRITIN: Ferritin: 387 ng/mL — ABNORMAL HIGH (ref 24–336)

## 2020-04-25 MED ORDER — PANTOPRAZOLE SODIUM 40 MG PO TBEC
40.0000 mg | DELAYED_RELEASE_TABLET | Freq: Every day | ORAL | Status: DC
  Administered 2020-04-26: 40 mg via ORAL
  Filled 2020-04-25: qty 1

## 2020-04-25 NOTE — Plan of Care (Signed)
Alert and oriented x 4. Independent with adls. Continues on 3 l/min oxygen. C/o mild headache. No s/s of respiratory distress noted. Pt anticipating d/c home. Problem: Education: Goal: Knowledge of General Education information will improve Description: Including pain rating scale, medication(s)/side effects and non-pharmacologic comfort measures Outcome: Progressing   Problem: Health Behavior/Discharge Planning: Goal: Ability to manage health-related needs will improve Outcome: Progressing   Problem: Clinical Measurements: Goal: Ability to maintain clinical measurements within normal limits will improve Outcome: Progressing Goal: Will remain free from infection Outcome: Progressing Goal: Diagnostic test results will improve Outcome: Progressing Goal: Respiratory complications will improve Outcome: Progressing Goal: Cardiovascular complication will be avoided Outcome: Progressing   Problem: Activity: Goal: Risk for activity intolerance will decrease Outcome: Progressing   Problem: Nutrition: Goal: Adequate nutrition will be maintained Outcome: Progressing   Problem: Coping: Goal: Level of anxiety will decrease Outcome: Progressing   Problem: Elimination: Goal: Will not experience complications related to bowel motility Outcome: Progressing Goal: Will not experience complications related to urinary retention Outcome: Progressing   Problem: Pain Managment: Goal: General experience of comfort will improve Outcome: Progressing   Problem: Safety: Goal: Ability to remain free from injury will improve Outcome: Progressing   Problem: Skin Integrity: Goal: Risk for impaired skin integrity will decrease Outcome: Progressing   Problem: Education: Goal: Knowledge of risk factors and measures for prevention of condition will improve Outcome: Progressing   Problem: Coping: Goal: Psychosocial and spiritual needs will be supported Outcome: Progressing   Problem:  Respiratory: Goal: Will maintain a patent airway Outcome: Progressing Goal: Complications related to the disease process, condition or treatment will be avoided or minimized Outcome: Progressing

## 2020-04-25 NOTE — Progress Notes (Signed)
PROGRESS NOTE  Danny Hampton TKW:[id number] DOB: 04-Apr-1955 DOA: 04/20/2020 PCP: Alda Berthold, DO  Brief History: 65 year old male with a history of hypertension, diabetes mellitus type 2, myotonia congenita, restless leg syndrome, and anxiety presenting with 10-day history of shortness of breath, coughing, headache, and anorexia with generalized weakness. The patient has had some subjective fevers and chills. He states that his symptoms worsened on 04/16/2020 to the point where he was having trouble even getting out of bed. He had some nausea but denied any vomiting, diarrhea, chest pain, hemoptysis, hematochezia, melena. Because of his worsening symptoms he presented for further evaluation. In the emergency department, the patient was afebrile and hemodynamically stable albeit with soft blood pressures. Oxygen saturation was 80% on room air. He was placed on 5 L nasal cannula with oxygen saturation 95-96%. BMP showed a sodium 127, serum creatinine 0.74, potassium 4.3. WBC 7.0, hemoglobin 11.5, platelets 224K.Covid RT-PCR was positive. Chest x-ray showed bilateral patchy infiltrates. The patient was started on steroids and remdesivir. He is not vaccinated for COVID-19  Assessment/Plan: Acute respiratory failure with hypoxia due to COVID-19 pneumonia -Oxygen saturation 80% on room airon presentation -Currently stable on 5 L>>10L HFNC>>8L>>6L>>>3L -patient has completed remdesivir infusions.  -continue steroids -CRP 38.6>>30.7>>27.8>>19.0 -D-dimer 1.37>>1.14>>1.20>>1.18>>1.31 -Ferritin 295>>431>>402>>363 -PCT 0.16 -CTA chest--neg PE; bilat GGO; mild mediastinal LN -Continue baricitinb started on 2/13 (day 4/14) -Continue prone positioning as much as pt can tolerate  Hyponatremia -Secondary to volume depletion and poor solute intake -Improving/resolved with the use of IV hydration. -Continue to follow electrolytes trend.  Diabetes mellitus type  2 -Continue holding Metformin while inpatient. -Hemoglobin A1c--6.7 -Continue NovoLog sliding scale  Essential hypertension -Stable overall without the use of antihypertensive agent -Continue to monitor vital signs.  Restless leg syndrome -Continue home dose of ropinirole 0.5 mg at 3 PM, 1 mg at 9 PM -Continue home dose carbidopa levodopa48.75/195mg  to three times daily.  Myotonia congenita -Continue Dilantin 100 mg 3 times daily  Hyperlipidemia -Continue statin  Depression/anxiety -Continue Paxil, Atarax and as needed clonazepam. -No suicidal ideation or hallucination.  GERD -Continue PPI    Status is: Inpatient  Remains inpatient appropriate because:IV treatments appropriate due to intensity of illness or inability to take PO   Dispo: The patient is from:Home Anticipated d/c is JM:EQAS Anticipated d/c date is: 2/17 Patient currently is medically for discharge; unfortunately unable to have oxygen delivered today (equipment/DME coming from [place]). Difficult to place patient No        Family Communication:noFamily at bedside  Consultants:none  Code Status: FULL   DVT Prophylaxis: [place]akin Lovenox   Procedures: As Listed in Progress Note Above  Antibiotics: None     Subjective: Denies chest pain, no nausea, no vomiting, no fevers.  Expressed breathing continued to improve.  Requiring 3 L nasal cannula supplementation.  Objective: Vitals:   04/25/20 1129 04/25/20 1132 04/25/20 1300 04/25/20 1340  BP: (!) 147/67   (!) 135/99  Pulse: 73   84  Resp: 16   16  Temp: 98.7 F (37.1 C)   98.2 F (36.8 C)  TempSrc: Oral   Oral  SpO2: (!) 89% 92% 93% 94%  Weight:      Height:        Intake/Output Summary (Last 24 hours) at 04/25/2020 1604 Last data filed at 04/25/2020 1208 Gross per 24 hour  Intake 960 ml  Output --  Net 960 ml   Weight change:   Exam:  General exam:  Alert, awake, oriented x 3; reports feeling much better and breathing easier.  Desaturation screening demonstrating the need for 3 L nasal cannula supplementation (especially with exertion).  Patient reports intermittent nonproductive coughing spells and winded sensation with activity. Respiratory system: positive scattered rhonchi bilaterally; no wheezing, no crackles.  No using accessory muscle. Cardiovascular system:RRR. No murmurs, rubs, gallops. Gastrointestinal system: Abdomen is nondistended, soft and nontender. No organomegaly or masses felt. Normal bowel sounds heard. Central nervous system: Alert and oriented. No focal neurological deficits. Extremities: No cyanosis or clubbing.  No edema appreciated on exam. Skin: No petechiae. Psychiatry: Judgement and insight appear normal. Mood & affect appropriate.    Data Reviewed: I have personally reviewed following labs and imaging studies Basic Metabolic Panel: Recent Labs  Lab 04/21/20 0515 04/22/20 0605 04/23/20 0533 04/24/20 0523 04/25/20 0400  NA 125* 133* 133* 134* 134*  K 3.6 4.6 4.8 5.2* 5.0  CL 87* 94* 95* 97* 98  CO2 24 29 29 28 27   GLUCOSE 174* 136* 159* 159* 165*  BUN 14 11 12 12 14   CREATININE 0.67 0.63 0.54* 0.56* 0.57*  CALCIUM 8.6* 8.8* 8.8* 9.2 9.2  MG 1.5* 1.7 2.0 1.9 1.7  PHOS 4.1 2.9 3.4 4.1 4.2   Liver Function Tests: Recent Labs  Lab 04/21/20 0515 04/22/20 0605 04/23/20 0533 04/24/20 0523 04/25/20 0400  AST 61* 126* 144* 131* 174*  ALT 16 19 18 16 25   ALKPHOS 67 65 63 68 68  BILITOT 1.1 0.5 0.3 0.3 0.4  PROT 6.8 6.6 6.5 6.7 6.6  ALBUMIN 3.0* 3.0* 2.8* 3.0* 2.9*   CBC: Recent Labs  Lab 04/21/20 0515 04/22/20 0605 04/23/20 0533 04/24/20 0523 04/25/20 0400  WBC 4.8 4.8 6.6 7.1 7.7  NEUTROABS 4.1 3.6 5.1 5.8 6.1  HGB 10.9* 10.2* 9.9* 10.1* 10.2*  HCT 30.3* 29.5* 28.4* 30.3* 30.4*  MCV 86.6 88.1 89.3 91.8 91.6  PLT 247 305 337 437* 485*   CBG: Recent Labs  Lab 04/24/20 1154  04/24/20 1629 04/24/20 2126 04/25/20 0744 04/25/20 1126  GLUCAP 223* 169* 134* 104* 146*   Urine analysis:    Component Value Date/Time   COLORURINE YELLOW 10/07/2018 0923   APPEARANCEUR HAZY (A) 10/07/2018 0923   LABSPEC 1.005 10/07/2018 0923   PHURINE 7.0 10/07/2018 0923   GLUCOSEU NEGATIVE 10/07/2018 0923   HGBUR NEGATIVE 10/07/2018 0923   BILIRUBINUR NEGATIVE 10/07/2018 0923   KETONESUR NEGATIVE 10/07/2018 0923   PROTEINUR NEGATIVE 10/07/2018 0923   NITRITE NEGATIVE 10/07/2018 0923   LEUKOCYTESUR NEGATIVE 10/07/2018 0923   Sepsis Labs:  Recent Results (from the past 240 hour(s))  Resp Panel by RT-PCR (Flu A&B, Covid) Nasopharyngeal Swab     Status: Abnormal   Collection Time: 04/20/20  7:52 PM   Specimen: Nasopharyngeal Swab; Nasopharyngeal(NP) swabs in vial transport medium  Result Value Ref Range Status   SARS Coronavirus 2 by RT PCR POSITIVE (A) NEGATIVE Final    Comment: CRITICAL RESULT CALLED TO, READ BACK BY AND VERIFIED WITH: TURNER,C AT 2145 ON 2.11.22 BY ISLEY,B (NOTE) SARS-CoV-2 target nucleic acids are DETECTED.  The SARS-CoV-2 RNA is generally detectable in upper respiratory specimens during the acute phase of infection. Positive results are indicative of the presence of the identified virus, but do not rule out bacterial infection or co-infection with other pathogens not detected by the test. Clinical correlation with patient history and other diagnostic information is necessary to determine patient infection status. The expected result is Negative.  Fact Sheet for  Patients: EntrepreneurPulse.com.au  Fact Sheet for Healthcare Providers: IncredibleEmployment.be  This test is not yet approved or cleared by the [place] FDA and  has been authorized for detection and/or diagnosis of SARS-CoV-2 by FDA under an Emergency Use Authorization (EUA).  This EUA will remain in effect (meaning th is test can be used) for  the duration of  the COVID-19 declaration under Section 564(b)(1) of the Act, 21 U.S.C. section 360bbb-3(b)(1), unless the authorization is terminated or revoked sooner.     Influenza A by PCR NEGATIVE NEGATIVE Final   Influenza B by PCR NEGATIVE NEGATIVE Final    Comment: (NOTE) The Xpert Xpress SARS-CoV-2/FLU/RSV plus assay is intended as an aid in the diagnosis of influenza from Nasopharyngeal swab specimens and should not be used as a sole basis for treatment. Nasal washings and aspirates are unacceptable for Xpert Xpress SARS-CoV-2/FLU/RSV testing.  Fact Sheet for Patients: EntrepreneurPulse.com.au  Fact Sheet for Healthcare Providers: IncredibleEmployment.be  This test is not yet approved or cleared by the [place] FDA and has been authorized for detection and/or diagnosis of SARS-CoV-2 by FDA under an Emergency Use Authorization (EUA). This EUA will remain in effect (meaning this test can be used) for the duration of the COVID-19 declaration under Section 564(b)(1) of the Act, 21 U.S.C. section 360bbb-3(b)(1), unless the authorization is terminated or revoked.  Performed at [facility], [address], [place], [place] [address]      Scheduled Meds: . albuterol  2 puff Inhalation Q6H WA  . vitamin C  500 mg Oral Daily  . aspirin  81 mg Oral Daily  . atorvastatin  40 mg Oral Daily  . baricitinib  4 mg Oral Daily  . carbidopa-levodopa  1 tablet Oral QID  . enoxaparin (LOVENOX) injection  40 mg Subcutaneous QHS  . feeding supplement (GLUCERNA SHAKE)  237 mL Oral TID BM  . insulin aspart  0-5 Units Subcutaneous QHS  . insulin aspart  0-9 Units Subcutaneous TID WC  . loratadine  10 mg Oral Daily  . methylPREDNISolone (SOLU-MEDROL) injection  80 mg Intravenous Q12H  . [START ON 04/26/2020] pantoprazole  40 mg Oral Daily  . PARoxetine  20 mg Oral Daily  . phenytoin  100 mg Oral TID  . rOPINIRole  0.5 mg Oral BID  . zinc sulfate   220 mg Oral Daily   Continuous Infusions:  Procedures/Studies: CT ANGIO CHEST PE W OR WO CONTRAST  Result Date: 04/22/2020 CLINICAL DATA:  Chest pain, shortness of breath, hypoxia. COVID positive. Cough and dyspnea. EXAM: CT ANGIOGRAPHY CHEST WITH CONTRAST TECHNIQUE: Multidetector CT imaging of the chest was performed using the standard protocol during bolus administration of intravenous contrast. Multiplanar CT image reconstructions and MIPs were obtained to evaluate the vascular anatomy. CONTRAST:  51mL OMNIPAQUE IOHEXOL 350 MG/ML SOLN COMPARISON:  Chest radiograph 04/20/2020 FINDINGS: Cardiovascular: No filling defect is identified in the pulmonary arterial tree to suggest pulmonary embolus. Coronary, aortic arch, and branch vessel atherosclerotic vascular disease. Mild cardiomegaly. No pericardial effusion. Mediastinum/Nodes: There is a mildly prominent AP window lymph node measuring 1.1 cm in short axis on image 28 series 4. A right paratracheal node measures 1.4 cm in short axis on image 29 of series 4 and has a fatty hilum. Right hilar lymph node 1.3 cm in short axis, image 36 series 4. Lungs/Pleura: Bilateral ground-glass opacities are present with some internal interstitial accentuation resembling a crazy paving appearance. In the context of the patient's positive COVID test, this likely represents bilateral  COVID pneumonia. No pleural effusion. Upper Abdomen: Unremarkable Musculoskeletal: There some periapical lucencies along the remaining mandibular teeth. Lower cervical and thoracic spondylosis and degenerative disc disease. There is potentially some faint opacification of the posterior longitudinal ligament at the C7 level. Review of the MIP images confirms the above findings. IMPRESSION: 1. No filling defect is identified in the pulmonary arterial tree to suggest pulmonary embolus. 2. Bilateral ground-glass opacities with some internal interstitial accentuation resembling a crazy paving  appearance. In the context of the patient's positive COVID test, this likely represents bilateral COVID pneumonia. 3. Mild cardiomegaly. 4. Coronary, aortic arch, and branch vessel atherosclerotic vascular disease. 5. Mild mediastinal and right hilar adenopathy, probably reactive. 6. Periapical lucencies along the remaining mandibular teeth, compatible with tooth decay. 7. Lower cervical and thoracic spondylosis and degenerative disc disease. Possible faint opacification of the posterior longitudinal ligament at the C7 level. Aortic Atherosclerosis (ICD10-I70.0). Electronically Signed   By: Van Clines M.D.   On: 04/22/2020 14:17   DG Chest Port 1 View  Result Date: 04/20/2020 CLINICAL DATA:  Cough and shortness of breath.  Weakness. EXAM: PORTABLE CHEST 1 VIEW COMPARISON:  10/07/2018 FINDINGS: Lung volumes are low. Patchy heterogeneous bilateral airspace opacities in a peripheral predominant distribution. Normal heart size for technique. Unchanged mediastinal contours. No pneumothorax or large pleural effusion. No evidence of pneumomediastinum. No acute osseous abnormalities are seen. IMPRESSION: Patchy heterogeneous bilateral airspace opacities in a peripheral predominant distribution, suspicious for multifocal pneumonia, pattern often seen with COVID 19. Electronically Signed   By: Keith Rake M.D.   On: 04/20/2020 19:58    Barton Dubois, MD  [facility]  If 7PM-7AM, please contact night-coverage [email or web address] Password [email or web address] 04/25/2020, 4:04 PM   LOS: 5 days

## 2020-04-25 NOTE — Progress Notes (Signed)
SATURATION QUALIFICATIONS: (This note is used to comply with regulatory documentation for home oxygen)  Patient Saturations on Room Air at Rest = 88%  Patient Saturations on Room Air while Ambulating = 80 %   Patient Saturations on 3 Liters of oxygen while Ambulating = 92%  Please briefly explain why patient needs home oxygen:

## 2020-04-25 NOTE — TOC Initial Note (Signed)
Transition of Care [facility]) - Initial/Assessment Note    Patient Details  Name: Danny Hampton MRN: [id number] Date of Birth: 1955-04-16  Transition of Care [facility]) CM/SW Contact:    Shade Flood, LCSW Phone Number: 04/25/2020, 1:16 PM  Clinical Narrative:                   Expected Discharge Plan: Home/Self Care Barriers to Discharge: Continued Medical Work up   Patient Goals and CMS Choice    Pt admitted from home with positive diagnosis for COVID19. Per MD, pt will likely be stable for dc home tomorrow and pt will require Home O2 at dc. Voicemail message left for [facility], [place]ec[place], and clinical information faxed with update on need for setup for a dc tomorrow.  TOC will follow and continue to assist with dc planning needs.    Expected Discharge Plan and Services Expected Discharge Plan: Home/Self Care In-house Referral: Clinical Social Work     Living arrangements for the past 2 months: [place]                 DME Arranged: Oxygen DME Agency: [place] Date DME Agency Contacted: 04/25/20   Representative spoke with at DME Agency: Darlina Guys            Prior Living Arrangements/Services Living arrangements for the past 2 months: [place] Lives with:: Significant Other Patient language and need for interpreter reviewed:: Yes Do you feel safe going back to the place where you live?: Yes      Need for Family Participation in Patient Care: Yes (Comment) Care giver support system in place?: Yes (comment)   Criminal Activity/Legal Involvement Pertinent to Current Situation/Hospitalization: No - Comment as needed  Activities of Daily Living Home Assistive Devices/Equipment: None ADL Screening (condition at time of admission) Patient's cognitive ability adequate to safely complete daily activities?: Yes Is the patient deaf or have difficulty hearing?: No Does the patient have difficulty seeing, even when wearing  glasses/contacts?: No Does the patient have difficulty concentrating, remembering, or making decisions?: No Patient able to express need for assistance with ADLs?: Yes Does the patient have difficulty dressing or bathing?: No Independently performs ADLs?: Yes (appropriate for developmental age) Does the patient have difficulty walking or climbing stairs?: No Weakness of Legs: None Weakness of Arms/Hands: None  Permission Sought/Granted                  Emotional Assessment       Orientation: : Oriented to Self,Oriented to Place,Oriented to  Time,Oriented to Situation Alcohol / Substance Use: Not Applicable Psych Involvement: No (comment)  Admission diagnosis:  Acute respiratory failure due to COVID-19 ([place]) [U07.1, J96.00] Pneumonia due to COVID-19 virus [U07.1, J12.82] Patient Active Problem List   Diagnosis Date Noted  . Acute respiratory failure with hypoxia ([place]) 04/21/2020  . Hyponatremia 04/21/2020  . Hypoalbuminemia 04/21/2020  . GERD (gastroesophageal reflux disease) 04/21/2020  . Hyperglycemia due to diabetes mellitus ([place]) 04/21/2020  . Obesity (BMI 30-39.9) 04/21/2020  . Myotonia congenita 04/21/2020  . Acute respiratory failure due to COVID-19 ([place]) 04/20/2020  . Pain in right shoulder 09/21/2019  . Total knee replacement status, right 10/12/2018  . Unilateral primary osteoarthritis, right knee 09/30/2018  . Cervicalgia 04/21/2018  . Bilateral primary osteoarthritis of knee 01/03/2016   PCP:  Alda Berthold, DO Pharmacy:   [facility] [address] [place]  [place] [address] Phone: [phone] Fax: [place], [place] [address] Phone: [phone] Fax: [phone]     Social Determinants of Health ([place]) Interventions    Readmission Risk Interventions No flowsheet data found.

## 2020-04-26 DIAGNOSIS — E1165 Type 2 diabetes mellitus with hyperglycemia: Secondary | ICD-10-CM | POA: Diagnosis not present

## 2020-04-26 DIAGNOSIS — K219 Gastro-esophageal reflux disease without esophagitis: Secondary | ICD-10-CM | POA: Diagnosis not present

## 2020-04-26 DIAGNOSIS — E669 Obesity, unspecified: Secondary | ICD-10-CM

## 2020-04-26 DIAGNOSIS — U071 COVID-19: Secondary | ICD-10-CM | POA: Diagnosis not present

## 2020-04-26 DIAGNOSIS — J9601 Acute respiratory failure with hypoxia: Secondary | ICD-10-CM | POA: Diagnosis not present

## 2020-04-26 DIAGNOSIS — J9600 Acute respiratory failure, unspecified whether with hypoxia or hypercapnia: Secondary | ICD-10-CM

## 2020-04-26 DIAGNOSIS — E785 Hyperlipidemia, unspecified: Secondary | ICD-10-CM

## 2020-04-26 DIAGNOSIS — I10 Essential (primary) hypertension: Secondary | ICD-10-CM

## 2020-04-26 LAB — GLUCOSE, CAPILLARY
Glucose-Capillary: 109 mg/dL — ABNORMAL HIGH (ref 70–99)
Glucose-Capillary: 150 mg/dL — ABNORMAL HIGH (ref 70–99)

## 2020-04-26 MED ORDER — ASCORBIC ACID 500 MG PO TABS: 500.0000 mg | ORAL_TABLET | Freq: Every day | ORAL | 1 refills | Status: DC

## 2020-04-26 MED ORDER — ASPIRIN 81 MG PO CHEW: 81.0000 mg | CHEWABLE_TABLET | Freq: Every day | ORAL | Status: DC

## 2020-04-26 MED ORDER — PREDNISONE 20 MG PO TABS: ORAL_TABLET | ORAL | 0 refills | Status: DC

## 2020-04-26 MED ORDER — GUAIFENESIN-DM 100-10 MG/5ML PO SYRP: 10.0000 mL | ORAL_SOLUTION | Freq: Four times a day (QID) | ORAL | 0 refills | Status: DC | PRN

## 2020-04-26 MED ORDER — ZINC SULFATE 220 (50 ZN) MG PO CAPS: 220.0000 mg | ORAL_CAPSULE | Freq: Every day | ORAL | 3 refills | Status: DC

## 2020-04-26 MED ORDER — ALBUTEROL SULFATE HFA 108 (90 BASE) MCG/ACT IN AERS: 2.0000 | INHALATION_SPRAY | Freq: Four times a day (QID) | RESPIRATORY_TRACT | 1 refills | Status: DC | PRN

## 2020-04-26 NOTE — Discharge Summary (Signed)
Physician Discharge Summary  Danny Hampton GGY:[id number] DOB: [birth year] DOA: 04/20/2020  PCP: Alda Berthold, DO  Admit date: 04/20/2020 Discharge date: 04/26/2020  Time spent: 35 minutes  Recommendations for Outpatient Follow-up:  1. Repeat basic metabolic panel to follow twice renal function 2. Repeat chest x-ray in 6 weeks to assure complete resolution of infiltrate 3. Reassess the need for oxygen supplementation and continue to wean off as tolerated. 4. Reassess patient CBGs/A1c with further adjustment to hypoglycemia regimen as required.   Discharge Diagnoses:  Active Problems:   Acute respiratory failure due to COVID-19 [facility])   Acute respiratory failure with hypoxia (HCC)   Hyponatremia   Hypoalbuminemia   GERD (gastroesophageal reflux disease)   Hyperglycemia due to diabetes mellitus (HCC)   Obesity (BMI 30-39.9)   Myotonia congenita   Primary hypertension   Hyperlipidemia   Discharge Condition: Stable and improved.  Discharged home with instruction to follow-up with PCP in 2 weeks.  CODE STATUS: DNR  Diet recommendation: Heart healthy modified carbohydrate diet.  Filed Weights   04/20/20 1851 04/21/20 1717  Weight: 90.7 kg 93.6 kg    History of present illness:  65 year old male with a history of hypertension, diabetes mellitus type 2, myotonia congenita, restless leg syndrome, and anxiety presenting with 10-day history of shortness of breath, coughing, headache, and anorexia with generalized weakness. The patient has had some subjective fevers and chills. He states that his symptoms worsened on 04/16/2020 to the point where he was having trouble even getting out of bed. He had some nausea but denied any vomiting, diarrhea, chest pain, hemoptysis, hematochezia, melena. Because of his worsening symptoms he presented for further evaluation. In the emergency department, the patient was afebrile and hemodynamically stable albeit with soft blood pressures.  Oxygen saturation was 80% on room air. He was placed on 5 L nasal cannula with oxygen saturation 95-96%. BMP showed a sodium 127, serum creatinine 0.74, potassium 4.3. WBC 7.0, hemoglobin 11.5, platelets 224K.Covid RT-PCR was positive. Chest x-ray showed bilateral patchy infiltrates. The patient was started on steroids and remdesivir. He is not vaccinated for COVID-19  Hospital Course:  Acute respiratory failure with hypoxia due to COVID-19 pneumonia -Oxygen saturation 80% on room airon presentation -Currently stable on 5 L>>10L HFNC>>8L>>6L>>>3L -patient has completed remdesivir infusions; and 5 days of baricitinib.  -Discharge on the steroids tapering and with as needed albuterol inhaler and Robitussin. -CTA chest--neg PE; bilat GGO; mild mediastinal LN -Continue prone positioning as much as as possible and continue increasing activities as tolerated. -Patient instructed to follow the 3 W's and to follow-up with PCP in 2 weeks.  Hyponatremia -Secondary to volume depletion, diuretic usage and poor solute intake. -Improved/resolved with the use of IV hydration. -Repeat basic metabolic panel follow-up visit to reassess electrolytes stability.  Diabetes mellitus type 2 -Continue to follow modified carbohydrate diet. -Most recent Hemoglobin A1c--6.7 -Patient will resume home oral hypoglycemic agents at discharge. -Mild elevation in his CBGs anticipated while completing steroids tapering.  Essential hypertension -Stable and well-controlled overall. -Resume home antihypertensive regimen -Patient advised to follow heart healthy diet.  Restless leg syndrome -Continue home dose of ropinirole 0.5 mg at 3 PM, 1 mg at 9 PM -Continue home dose carbidopa levodopa48.75/195mg  to three times daily.  Myotonia congenita -Continue Dilantin 100 mg 3 times daily -Continue patient follow-up with neurology service.  Hyperlipidemia -Continue statin -Heart healthy diet has been  encouraged.  Depression/anxiety -Continue Paxil, Atarax and as needed clonazepam. -No suicidal ideation or hallucination. -Mood overall  stable.  GERD -Continue PPI  Class I obesity -Low calorie diet, portion control and increase physical activity discussed with patient. -Body mass index is 32.32 kg/m.   Procedures: See below for x-ray reports.  Consultations:  None  Discharge Exam: Vitals:   04/26/20 0741 04/26/20 0803  BP: 140/82   Pulse: 83   Resp:    Temp:    SpO2: 94% 95%    General: Afebrile, no chest pain, no nausea, no vomiting.  Reports breathing to be stable and feeling ready to go home.  Found to require 3 L nasal cannula supplementation at discharge. Cardiovascular: Rate controlled, no rubs, no gallops, no JVD. Respiratory: Improved air movement bilaterally; positive rhonchi, no wheezing, no crackles, no using accessory muscles. Abdomen: Soft, nontender, nondistended, positive bowel sounds Extremities: No cyanosis or clubbing.  Discharge Instructions   Discharge Instructions    Diet - low sodium heart healthy   Complete by: As directed    Diet Carb Modified   Complete by: As directed    Discharge instructions   Complete by: As directed    Take medications as prescribed Maintain adequate hydration  Arrange follow-up with PCP in 2 weeks Continue to follow the 3 W's : Wash your hands frequently, Wear your mask and Wait 6 feet apart (social distancing). Follow heart healthy and modify carbohydrate diet.   Increase activity slowly   Complete by: As directed      Allergies as of 04/26/2020   No Known Allergies     Medication List    STOP taking these medications   zinc gluconate 50 MG tablet     TAKE these medications   albuterol 108 (90 Base) MCG/ACT inhaler Commonly known as: VENTOLIN HFA Inhale 2 puffs into the lungs every 6 (six) hours as needed for wheezing or shortness of breath.   ascorbic acid 500 MG tablet Commonly known as:  VITAMIN C Take 1 tablet (500 mg total) by mouth daily.   aspirin 81 MG chewable tablet Chew 1 tablet (81 mg total) by mouth daily.   atorvastatin 80 MG tablet Commonly known as: LIPITOR Take 40 mg by mouth daily.   b complex vitamins tablet Take 1 tablet by mouth daily.   Carbidopa-Levodopa ER 48.75-195 MG Cpcr Take 1 tablet by mouth 4 (four) times daily.   clonazePAM 1 MG tablet Commonly known as: KLONOPIN Take 0.5 mg by mouth daily as needed for anxiety.   guaiFENesin-dextromethorphan 100-10 MG/5ML syrup Commonly known as: ROBITUSSIN DM Take 10 mLs by mouth every 6 (six) hours as needed for cough.   hydrOXYzine 25 MG tablet Commonly known as: ATARAX/VISTARIL Take 25 mg by mouth 4 (four) times daily as needed for anxiety.   lisinopril-hydrochlorothiazide 20-25 MG tablet Commonly known as: ZESTORETIC Take 1 tablet by mouth daily.   loratadine 10 MG tablet Commonly known as: CLARITIN Take 10 mg by mouth daily.   meloxicam 15 MG tablet Commonly known as: MOBIC Take 15 mg by mouth daily.   metFORMIN 500 MG tablet Commonly known as: GLUCOPHAGE Take 500 mg by mouth 2 (two) times daily with a meal.   metoprolol succinate 25 MG 24 hr tablet Commonly known as: Toprol XL Take 1 tablet (25 mg total) by mouth daily.   Multi-Vitamin tablet Take 1 tablet by mouth daily.   pantoprazole 40 MG tablet Commonly known as: PROTONIX Take 40 mg by mouth daily.   PARoxetine 20 MG tablet Commonly known as: PAXIL Take 20 mg by mouth daily.  phenytoin 100 MG ER capsule Commonly known as: DILANTIN Take 100 mg by mouth 3 (three) times daily.   predniSONE 20 MG tablet Commonly known as: DELTASONE Take 3 tablets by mouth daily x1 day; then 2 tablets by mouth daily x2 days; then 1 tablet by mouth daily x3 days; then 1/2 tablet by mouth daily x3 days and stop prednisone.   Refresh 1.4-0.6 % Soln Generic drug: Polyvinyl Alcohol-Povidone PF Place 1 drop into both eyes 3 (three)  times daily.   rOPINIRole 1 MG tablet Commonly known as: REQUIP Take 0.5mg  at 6pm and 1mg  at 9pm. What changed:   how much to take  how to take this  when to take this  additional instructions   sodium chloride 5 % ophthalmic solution Commonly known as: MURO 128 Place 1 drop into the right eye every 3 (three) hours as needed for eye irritation.   Vitamin D3 125 MCG (5000 UT) Tabs Take 5,000 Units by mouth daily.   zinc sulfate 220 (50 Zn) MG capsule Take 1 capsule (220 mg total) by mouth daily.            Durable Medical Equipment  (From admission, onward)         Start     Ordered   04/25/20 1227  For home use only DME oxygen  Once       Question Answer Comment  Length of Need 12 Months   Mode or (Route) Nasal cannula   Liters per Minute 3   Frequency Continuous (stationary and portable oxygen unit needed)   Oxygen conserving device Yes   Oxygen delivery system Gas      04/25/20 1227         No Known Allergies  Follow-up Information    Alda Berthold, DO. Schedule an appointment as soon as possible for a visit in 2 week(s).   Specialty: Neurology Contact information: [place] [address] [phone]        Satira Sark, MD .   Specialty: Cardiology Contact information: [place]  [phone]               The results of significant diagnostics from this hospitalization (including imaging, microbiology, ancillary and laboratory) are listed below for reference.    Significant Diagnostic Studies: CT ANGIO CHEST PE W OR WO CONTRAST  Result Date: 04/22/2020 CLINICAL DATA:  Chest pain, shortness of breath, hypoxia. COVID positive. Cough and dyspnea. EXAM: CT ANGIOGRAPHY CHEST WITH CONTRAST TECHNIQUE: Multidetector CT imaging of the chest was performed using the standard protocol during bolus administration of intravenous contrast. Multiplanar CT image reconstructions and  MIPs were obtained to evaluate the vascular anatomy. CONTRAST:  16mL OMNIPAQUE IOHEXOL 350 MG/ML SOLN COMPARISON:  Chest radiograph 04/20/2020 FINDINGS: Cardiovascular: No filling defect is identified in the pulmonary arterial tree to suggest pulmonary embolus. Coronary, aortic arch, and branch vessel atherosclerotic vascular disease. Mild cardiomegaly. No pericardial effusion. Mediastinum/Nodes: There is a mildly prominent AP window lymph node measuring 1.1 cm in short axis on image 28 series 4. A right paratracheal node measures 1.4 cm in short axis on image 29 of series 4 and has a fatty hilum. Right hilar lymph node 1.3 cm in short axis, image 36 series 4. Lungs/Pleura: Bilateral ground-glass opacities are present with some internal interstitial accentuation resembling a crazy paving appearance. In the context of the patient's positive COVID test, this likely represents bilateral COVID pneumonia. No  pleural effusion. Upper Abdomen: Unremarkable Musculoskeletal: There some periapical lucencies along the remaining mandibular teeth. Lower cervical and thoracic spondylosis and degenerative disc disease. There is potentially some faint opacification of the posterior longitudinal ligament at the C7 level. Review of the MIP images confirms the above findings. IMPRESSION: 1. No filling defect is identified in the pulmonary arterial tree to suggest pulmonary embolus. 2. Bilateral ground-glass opacities with some internal interstitial accentuation resembling a crazy paving appearance. In the context of the patient's positive COVID test, this likely represents bilateral COVID pneumonia. 3. Mild cardiomegaly. 4. Coronary, aortic arch, and branch vessel atherosclerotic vascular disease. 5. Mild mediastinal and right hilar adenopathy, probably reactive. 6. Periapical lucencies along the remaining mandibular teeth, compatible with tooth decay. 7. Lower cervical and thoracic spondylosis and degenerative disc disease. Possible  faint opacification of the posterior longitudinal ligament at the C7 level. Aortic Atherosclerosis (ICD10-I70.0). Electronically Signed   By: Van Clines M.D.   On: 04/22/2020 14:17   DG Chest Port 1 View  Result Date: 04/20/2020 CLINICAL DATA:  Cough and shortness of breath.  Weakness. EXAM: PORTABLE CHEST 1 VIEW COMPARISON:  10/07/2018 FINDINGS: Lung volumes are low. Patchy heterogeneous bilateral airspace opacities in a peripheral predominant distribution. Normal heart size for technique. Unchanged mediastinal contours. No pneumothorax or large pleural effusion. No evidence of pneumomediastinum. No acute osseous abnormalities are seen. IMPRESSION: Patchy heterogeneous bilateral airspace opacities in a peripheral predominant distribution, suspicious for multifocal pneumonia, pattern often seen with COVID 19. Electronically Signed   By: Keith Rake M.D.   On: 04/20/2020 19:58    Microbiology: Recent Results (from the past 240 hour(s))  Resp Panel by RT-PCR (Flu A&B, Covid) Nasopharyngeal Swab     Status: Abnormal   Collection Time: 04/20/20  7:52 PM   Specimen: Nasopharyngeal Swab; Nasopharyngeal(NP) swabs in vial transport medium  Result Value Ref Range Status   SARS Coronavirus 2 by RT PCR POSITIVE (A) NEGATIVE Final    Comment: CRITICAL RESULT CALLED TO, READ BACK BY AND VERIFIED WITH: TURNER,C AT 2145 ON 2.11.22 BY ISLEY,B (NOTE) SARS-CoV-2 target nucleic acids are DETECTED.  The SARS-CoV-2 RNA is generally detectable in upper respiratory specimens during the acute phase of infection. Positive results are indicative of the presence of the identified virus, but do not rule out bacterial infection or co-infection with other pathogens not detected by the test. Clinical correlation with patient history and other diagnostic information is necessary to determine patient infection status. The expected result is Negative.  Fact Sheet for  Patients: EntrepreneurPulse.com.au  Fact Sheet for Healthcare Providers: IncredibleEmployment.be  This test is not yet approved or cleared by the [place] FDA and  has been authorized for detection and/or diagnosis of SARS-CoV-2 by FDA under an Emergency Use Authorization (EUA).  This EUA will remain in effect (meaning th is test can be used) for the duration of  the COVID-19 declaration under Section 564(b)(1) of the Act, 21 U.S.C. section 360bbb-3(b)(1), unless the authorization is terminated or revoked sooner.     Influenza A by PCR NEGATIVE NEGATIVE Final   Influenza B by PCR NEGATIVE NEGATIVE Final    Comment: (NOTE) The Xpert Xpress SARS-CoV-2/FLU/RSV plus assay is intended as an aid in the diagnosis of influenza from Nasopharyngeal swab specimens and should not be used as a sole basis for treatment. Nasal washings and aspirates are unacceptable for Xpert Xpress SARS-CoV-2/FLU/RSV testing.  Fact Sheet for Patients: EntrepreneurPulse.com.au  Fact Sheet for Healthcare Providers: IncredibleEmployment.be  This test is not  yet approved or cleared by the [place] and has been authorized for detection and/or diagnosis of SARS-CoV-2 by FDA under an Emergency Use Authorization (EUA). This EUA will remain in effect (meaning this test can be used) for the duration of the COVID-19 declaration under Section 564(b)(1) of the Act, 21 U.S.C. section 360bbb-3(b)(1), unless the authorization is terminated or revoked.  Performed at [facility], [address], [place], [place] [address]      Labs: Basic Metabolic Panel: Recent Labs  Lab 04/21/20 0515 04/22/20 0605 04/23/20 0533 04/24/20 0523 04/25/20 0400  NA 125* 133* 133* 134* 134*  K 3.6 4.6 4.8 5.2* 5.0  CL 87* 94* 95* 97* 98  CO2 24 29 29 28 27   GLUCOSE 174* 136* 159* 159* 165*  BUN 14 11 12 12 14   CREATININE 0.67 0.63 0.54* 0.56*  0.57*  CALCIUM 8.6* 8.8* 8.8* 9.2 9.2  MG 1.5* 1.7 2.0 1.9 1.7  PHOS 4.1 2.9 3.4 4.1 4.2   Liver Function Tests: Recent Labs  Lab 04/21/20 0515 04/22/20 0605 04/23/20 0533 04/24/20 0523 04/25/20 0400  AST 61* 126* 144* 131* 174*  ALT 16 19 18 16 25   ALKPHOS 67 65 63 68 68  BILITOT 1.1 0.5 0.3 0.3 0.4  PROT 6.8 6.6 6.5 6.7 6.6  ALBUMIN 3.0* 3.0* 2.8* 3.0* 2.9*   CBC: Recent Labs  Lab 04/21/20 0515 04/22/20 0605 04/23/20 0533 04/24/20 0523 04/25/20 0400  WBC 4.8 4.8 6.6 7.1 7.7  NEUTROABS 4.1 3.6 5.1 5.8 6.1  HGB 10.9* 10.2* 9.9* 10.1* 10.2*  HCT 30.3* 29.5* 28.4* 30.3* 30.4*  MCV 86.6 88.1 89.3 91.8 91.6  PLT 247 305 337 437* 485*   CBG: Recent Labs  Lab 04/25/20 0744 04/25/20 1126 04/25/20 1707 04/25/20 1957 04/26/20 0736  GLUCAP 104* 146* 137* 126* 109*    Signed:  Barton Dubois MD.  [facility] 04/26/2020, 8:08 AM

## 2020-04-26 NOTE — TOC Transition Note (Signed)
Transition of Care [facility]) - CM/SW Discharge Note   Patient Details  Name: Danny Hampton MRN: [id number] Date of Birth: September 13, 1955  Transition of Care [facility]) CM/SW Contact:  Shade Flood, LCSW Phone Number: 04/26/2020, 11:11 AM   Clinical Narrative:     Pt is stable for dc Was able to speak with pt about [facility] provider and pt stated he goes to the [place]. Spoke with Adonis Brook at the [facility] regarding need for Home O2. Clinical faxed to [place] at [phone]. Received confirmation from [facility] stating that she received the information and referred it to [facility] DME for the Home O2 needs. Pt will need to remain at the hospital until the portable O2 is delivered and then he can go home.   Updated RN who updated pt. There are no other TOC needs for dc.  Final next level of care: Home/Self Care Barriers to Discharge: Barriers Resolved   Patient Goals and CMS Choice   CMS Medicare.gov Compare Post Acute Care list provided to:: Patient Choice offered to / list presented to : Patient  Discharge Placement                       Discharge Plan and Services In-house Referral: Clinical Social Work              DME Arranged: Oxygen DME Agency: Other - Comment ([place]) Date DME Agency Contacted: 04/26/20   Representative spoke with at DME Agency: [place] ([place]) Interventions     Readmission Risk Interventions No flowsheet data found.

## 2020-04-26 NOTE — Discharge Instructions (Signed)

## 2020-06-12 ENCOUNTER — Ambulatory Visit: Payer: No Typology Code available for payment source | Admitting: Urology

## 2020-06-19 ENCOUNTER — Ambulatory Visit: Payer: No Typology Code available for payment source | Admitting: Urology

## 2020-07-31 ENCOUNTER — Other Ambulatory Visit: Payer: Self-pay | Admitting: Neurosurgery

## 2020-07-31 ENCOUNTER — Telehealth: Payer: Self-pay

## 2020-07-31 DIAGNOSIS — M5415 Radiculopathy, thoracolumbar region: Secondary | ICD-10-CM

## 2020-07-31 DIAGNOSIS — M48061 Spinal stenosis, lumbar region without neurogenic claudication: Secondary | ICD-10-CM

## 2020-07-31 DIAGNOSIS — M542 Cervicalgia: Secondary | ICD-10-CM

## 2020-07-31 NOTE — Progress Notes (Signed)
Phone call to patient to verify medication list and allergies for myelogram procedure. I spoke with Juliann Pulse who is the patients primary caregiver and significant other, with the pts consent. Pt/cg instructed to hold  Paxil for 48hrs prior to myelogram appointment time and 24 hours after appointment. Pt/cg also instructed to have a driver the day of the procedure, the procedure would take around 2 hours, and discharge instructions discussed. Pt/cg verbalized understanding.

## 2020-08-08 ENCOUNTER — Inpatient Hospital Stay: Admission: RE | Admit: 2020-08-08 | Payer: No Typology Code available for payment source | Source: Ambulatory Visit

## 2020-08-08 ENCOUNTER — Other Ambulatory Visit: Payer: No Typology Code available for payment source

## 2020-08-16 ENCOUNTER — Telehealth: Payer: Self-pay

## 2020-08-16 NOTE — Telephone Encounter (Signed)
Pt called to cancel his 08/21/20 myelogram procedure. Pt reports he has a "Skin disorder" and has a "rash" all over his back. Pt states he wants to be seen by a dermatologist and get this taken care of prior to doing the myelogram procedure. Pt did not wish to reschedule at this time. Pt has our direct number at [facility] and will call us when he is ready to reschedule his myelogram.

## 2020-08-21 ENCOUNTER — Other Ambulatory Visit: Payer: No Typology Code available for payment source

## 2020-08-21 ENCOUNTER — Inpatient Hospital Stay: Admission: RE | Admit: 2020-08-21 | Payer: No Typology Code available for payment source | Source: Ambulatory Visit

## 2020-08-23 ENCOUNTER — Telehealth: Payer: Self-pay

## 2020-08-23 NOTE — Telephone Encounter (Signed)
Patient called to say his dermatologist says there is no reason why he can't proceed with his myelogram, so he would like to be scheduled again.  He states an understanding to hold Paxil for 48 hours before, and 24 hours after, the myelogram.  He also states an understanding to hold Aspirin for three days before this procedure.

## 2020-09-03 ENCOUNTER — Ambulatory Visit
Admission: RE | Admit: 2020-09-03 | Discharge: 2020-09-03 | Disposition: A | Discharge status: Home / Self Care | Payer: No Typology Code available for payment source | Source: Ambulatory Visit | Attending: Neurosurgery | Admitting: Neurosurgery

## 2020-09-03 ENCOUNTER — Other Ambulatory Visit: Payer: Self-pay

## 2020-09-03 DIAGNOSIS — M48061 Spinal stenosis, lumbar region without neurogenic claudication: Secondary | ICD-10-CM

## 2020-09-03 DIAGNOSIS — M542 Cervicalgia: Secondary | ICD-10-CM

## 2020-09-03 DIAGNOSIS — M5415 Radiculopathy, thoracolumbar region: Secondary | ICD-10-CM

## 2020-09-03 IMAGING — XA DG MYELOGRAPHY LUMBAR INJ MULTI REGION
4 of 21 series · 4 of 24 positions shown · non-contrast
Comparison: Outside cervical spine MRI [DATE], thoracic spine
MRI [DATE], and lumbar spine MRI [DATE]

CLINICAL DATA: Neck and left greater than right arm pain. Pain in
the low back, buttocks, hips, and groin.
TECHNIQUE: Contiguous axial images were obtained through the cervical,
thoracic, and lumbar spine after the intrathecal infusion of
contrast. Coronal and sagittal reconstructions were obtained of the
axial image sets.

[Series 1: w lumbar spine lat · 0.15mm/px · 1 of 1 slices shown]
[im 1/1]
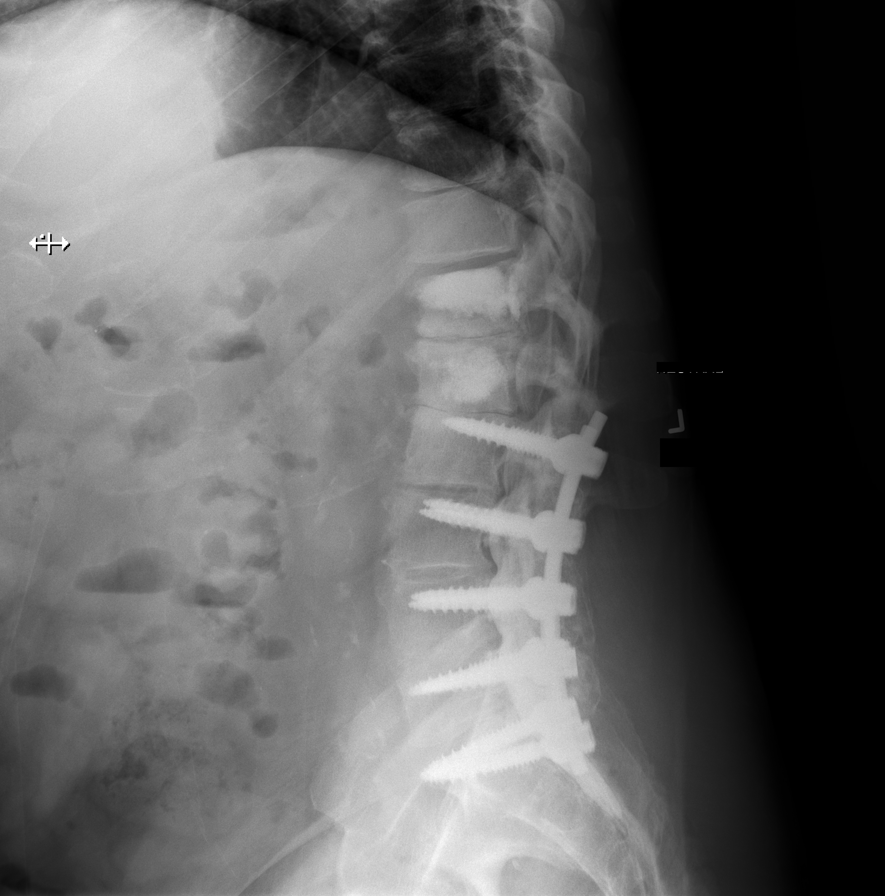

[Series 2: w lumbar spine flexion · 0.15mm/px · 1 of 1 slices shown]
[im 1/1]
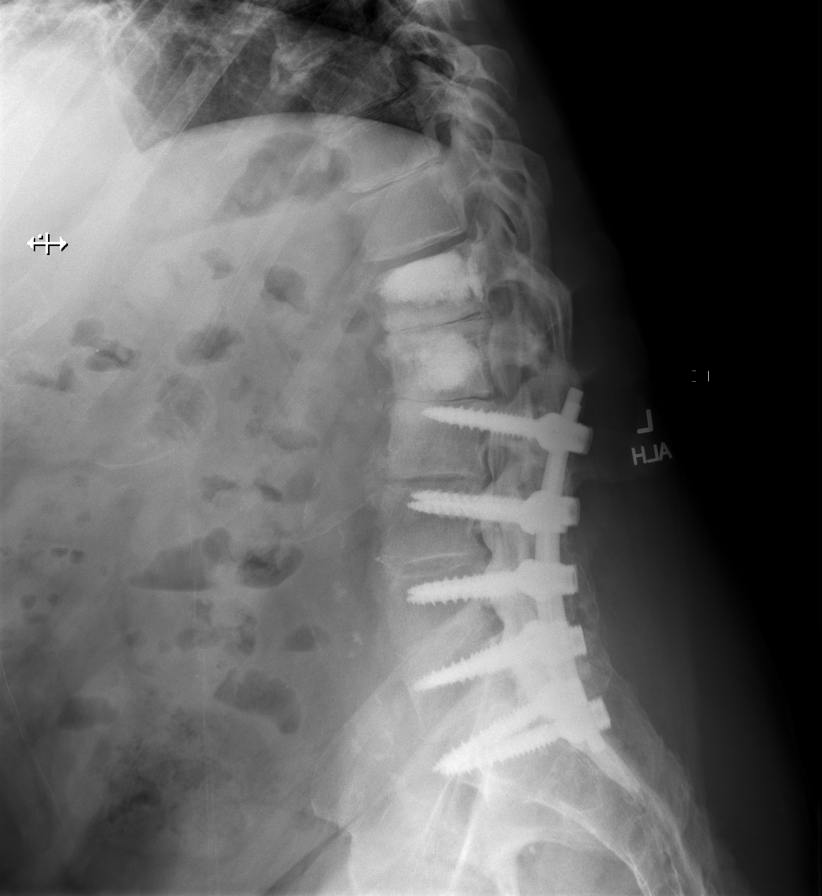

[Series 2: vasc adipose · 1 of 1 slices shown]
[im 1/1]
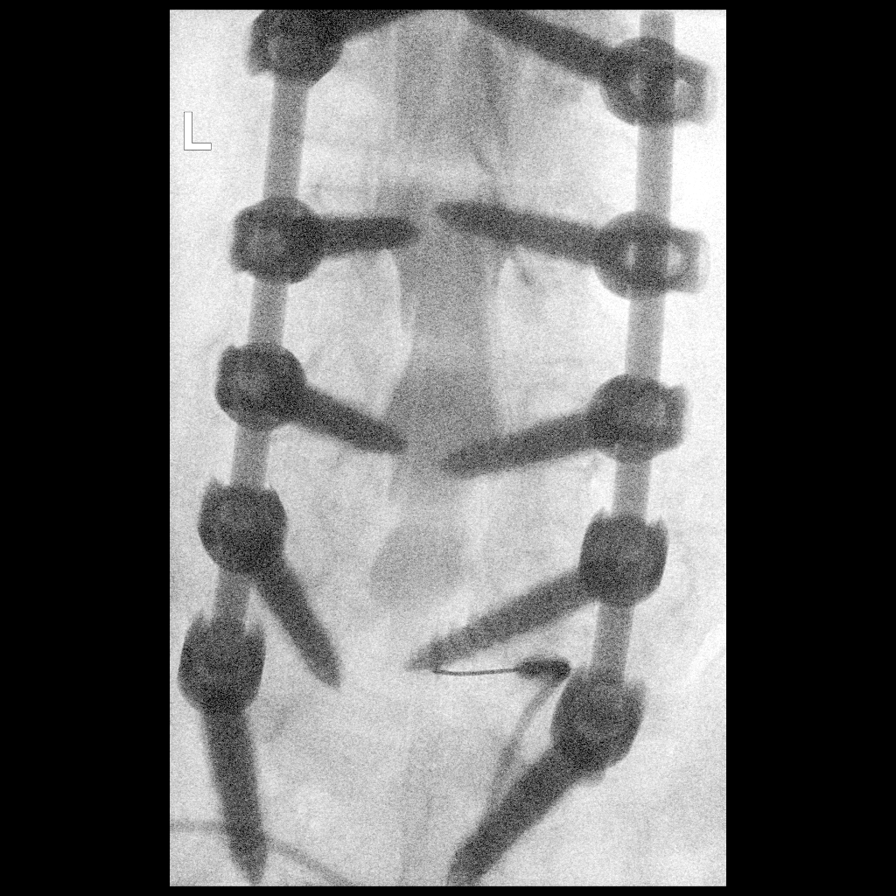

[Series 3: w lumbar spine extension · 0.15mm/px · 1 of 1 slices shown]
[im 1/1]
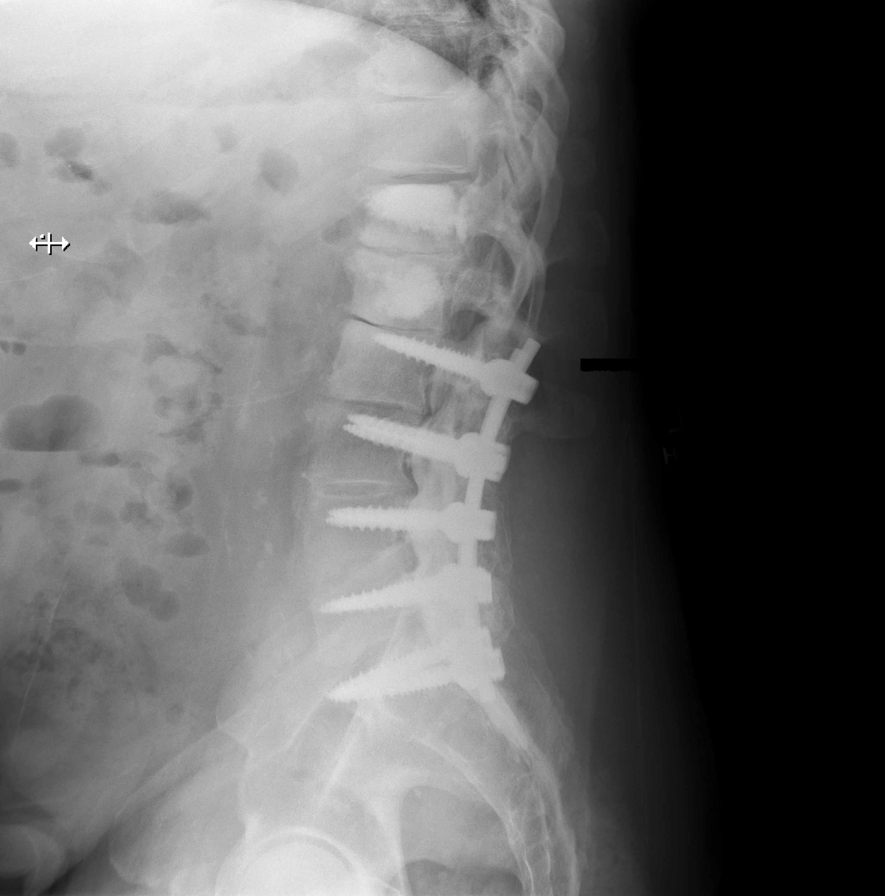

[4 of 24 positions shown; findings below may reference images not displayed]

FLUOROSCOPY TIME:  Fluoroscopy Time: 3 minutes 39 seconds

Radiation Exposure Index: 1269.50 microGray*m^2

PROCEDURE:
LUMBAR PUNCTURE FOR CERVICAL LUMBAR AND THORACIC MYELOGRAM

CERVICAL AND LUMBAR AND THORACIC MYELOGRAM

CT CERVICAL MYELOGRAM

CT LUMBAR MYELOGRAM

CT THORACIC MYELOGRAM

After thorough discussion of risks and benefits of the procedure
including bleeding, infection, injury to nerves, blood vessels,
adjacent structures as well as headache and CSF leak, written and
oral informed consent was obtained. Consent was obtained by Dr.
OHALLORAN.

Patient was positioned prone on the fluoroscopy table. Local
anesthesia was provided with 1% lidocaine without epinephrine after
prepped and draped in the usual sterile fashion. Puncture was
performed at L5-S1 using a 3 inch 22-gauge spinal needle via a right
interlaminar approach. Using a single pass through the dura, the
needle was placed within the thecal sac, with return of clear CSF.
10 mL Isovue [RC] was injected into the thecal sac, with normal
opacification of the nerve roots and cauda equina consistent with
free flow within the subarachnoid space. The patient was then moved
to the Trendelenburg position and contrast flowed into the Thoracic
and Cervical spine regions.

I personally performed the lumbar puncture and administered the
intrathecal contrast. I also personally supervised acquisition of
the myelogram images.
FINDINGS: CERVICAL, THORACIC, AND LUMBAR MYELOGRAM FINDINGS:

Assessment for potential stenosis in the mid and lower cervical
spine is limited by poor contrast visualization on conventional
myelogram images as well as superimposed shoulder tissues, and
assessment is deferred to CT.

There are scattered small ventral extradural defects in the mid
upper thoracic spine and moderate-sized defects in the lower
thoracic spine without evidence of a high-grade spinal stenosis.

There are 5 non rib-bearing lumbar type vertebrae. Lumbar vertebral
alignment is normal, and no abnormal motion is evident on flexion or
extension radiographs. L2-S1 posterior fusion is noted, and there
are ventral extradural defects at L2-3, L3-4, and L4-5 resulting in
likely mild spinal stenosis at L2-3. There is a larger ventral
extradural defect at L1-2 resulting in severe spinal stenosis.
Milder spinal stenosis is present at T12-L1.

CT CERVICAL MYELOGRAM FINDINGS:

There is chronic mild reversal of the normal cervical lordosis with
grade 1 anterolisthesis of C3 on C4 and grade 1 retrolisthesis of C4
on C5 and C6 on C7, unchanged. No acute fracture or suspicious
osseous lesion is identified. Advanced disc degeneration is present
at C4-5, C6-7 and C7-T1 with severe disc space narrowing and
prominent degenerative endplate changes at each level, greatly
progressed at C7-T1 since the prior MRI. Minimal calcified
atherosclerotic plaque is noted in the proximal right internal
carotid artery.

C2-3: Negative.

C3-4: Anterolisthesis with disc uncovering, left uncovertebral
spurring, and severe left facet arthrosis result in severe left
neural foraminal stenosis without spinal stenosis, unchanged.

C4-5: Retrolisthesis with disc uncovering/broad-based posterior disc
osteophyte complex and mild facet arthrosis result in moderate
spinal stenosis with mild-to-moderate right-sided cord flattening
and moderate right neural foraminal stenosis, unchanged.

C5-6: Negative.

C6-7: Retrolisthesis, a broad-based posterior disc osteophyte
complex, and mild facet arthrosis result in moderate spinal stenosis
with mild cord flattening and borderline to mild left neural
foraminal stenosis, unchanged.

C7-T1: Markedly progressive disc degeneration. Calcific density in
the ventral epidural space just to the right of midline behind the
C7 vertebral body may represent posterior longitudinal ligament
ossification or a chronically calcified disc protrusion and
corresponds to low signal material on the prior MRI, resulting in
mild to moderate spinal stenosis and mild right-sided cord
flattening. Mild facet arthrosis. No significant neural foraminal
stenosis.

CT THORACIC MYELOGRAM FINDINGS:

Trace anterolisthesis T2 on T3 and T3 on T4 is unchanged. No acute
fracture or suspicious osseous lesion is identified. There has been
interval T12 vertebral augmentation with cement noted in the ventral
epidural space at T12 and to a lesser extent T11. There is
moderately advanced disc degeneration throughout the mid and lower
thoracic spine. Mild thoracic aortic atherosclerosis is noted.

T1-2: Mild disc bulging and mild right and moderate left facet
arthrosis without significant stenosis, unchanged.

T2-3: Mild disc bulging and mild-to-moderate facet arthrosis without
significant stenosis, unchanged.

T3-4: Mild disc bulging and mild-to-moderate facet arthrosis without
significant stenosis, unchanged.

T4-5: Mild facet arthrosis without stenosis, unchanged.

T5-6: Mild left foraminal endplate spurring without significant
stenosis, unchanged.

T6-7: Mild disc bulging without significant stenosis, unchanged.

T7-8: A partially calcified right paracentral disc protrusion
appears slightly larger than on the prior MRI and results in mild
spinal stenosis with slight ventral cord flattening. Patent neural
foramina.

T8-9: Disc bulging, a central disc extrusion, and a right
paracentral to subarticular disc osteophyte complex result in mild
spinal stenosis, mildly progressed. Patent neural foramina.

T9-10: Disc bulging, a left paracentral disc osteophyte complex, and
moderate facet arthrosis result in mild spinal stenosis, stable to
slightly progressed. Patent neural foramina.

T10-11 disc bulging, endplate spurring, prominent dorsal epidural
fat, and mild facet arthrosis result in mild spinal stenosis, mildly
progressed. Patent neural foramina.

T11-12: Disc bulging and mild right facet arthrosis without
significant stenosis, unchanged.

CT LUMBAR MYELOGRAM FINDINGS:

Vertebral alignment is normal. No acute fracture or suspicious
osseous lesion is evident. Sequelae of L2-S1 posterior fusion are
again identified. Pedicle screws remain in place bilaterally and
appear well-positioned without evidence of loosening. There is solid
posterolateral osseous fusion from L2-S1, and there is also
interbody ankylosis from L2-L5.

There has been interval vertebral augmentation at L1. Extensive
sclerosis throughout the L1 vertebral body as well as along the T12
inferior and L2 superior endplates is likely degenerative, with
severe disc space narrowing and vacuum disc noted at T12-L1 and
L1-2.

The conus medullaris terminates at L1-2. There is abdominal aortic
atherosclerosis. Postoperative changes are noted in the posterior
lumbar soft tissues.

T12-L1: Disc bulging, endplate spurring, severe disc space height
loss, prominent dorsal epidural fat and mild facet and ligamentum
flavum hypertrophy result in mild spinal stenosis and severe right
neural foraminal stenosis, similar to the prior MRI.

L1-2: Markedly progressive disc degeneration since the prior MRI.
Circumferential disc bulging, a suspected calcified left paracentral
and subarticular disc protrusion, severe facet and ligamentum flavum
hypertrophy, and severe disc space height loss result in severe
spinal stenosis and moderate bilateral neural foraminal stenosis.

L2-3: Prior posterior decompression and fusion. Diffuse osteophytic
ridging and facet hypertrophy result in mild residual spinal
stenosis and mild right neural foraminal stenosis, unchanged.

L3-4: Prior posterior decompression and fusion. Widely patent spinal
canal. Unchanged mild bilateral neural foraminal stenosis due to
endplate spurring and facet hypertrophy.

L4-5: Prior posterior decompression and fusion. Widely patent spinal
canal. Unchanged mild bilateral neural foraminal stenosis due to
endplate spurring and facet hypertrophy.

L5-S1: Prior posterior decompression and fusion. Widely patent
spinal canal disc bulging, endplate spurring, and facet hypertrophy
result in minimal to mild bilateral neural foraminal stenosis,
unchanged.
IMPRESSION: Cervical spine:

1. Unchanged moderate spinal stenosis at C4-5 and C6-7.
2. Markedly progressive disc degeneration at C7-T1 since [DATE].
Posterior longitudinal ligament ossification or chronically
calcified disc protrusion behind the C7 vertebral body resulting in
mild to moderate spinal stenosis.
3. Severe left neural foraminal stenosis at C3-4 and moderate right
neural foraminal stenosis at C4-5.

Thoracic spine:

1. Moderate widespread thoracic disc and facet degeneration, mildly
progressed from [RC].
2. Mild spinal stenosis from T7-8 to T10-11.

Lumbar spine:

1. Solid L2-S1 fusion without residual compressive stenosis.
2. Markedly progressive adjacent segment disease at L1-2 with severe
spinal stenosis and moderate bilateral neural foraminal stenosis.
3. Mild spinal stenosis and severe right neural foraminal stenosis
at T12-L1.

## 2020-09-03 IMAGING — CT CT CERVICAL SPINE W/ CM
3 series · 8 of 14 positions shown, 9 images · non-contrast
Comparison: Outside cervical spine MRI [DATE], thoracic spine
MRI [DATE], and lumbar spine MRI [DATE]

CLINICAL DATA: Neck and left greater than right arm pain. Pain in
the low back, buttocks, hips, and groin.
TECHNIQUE: Contiguous axial images were obtained through the cervical,
thoracic, and lumbar spine after the intrathecal infusion of
contrast. Coronal and sagittal reconstructions were obtained of the
axial image sets.

[Series 3: cspine soft · axial · 0.27mm/px · z∈[-199,-143]mm · 2 of 84 slices shown]
[im 28/84  soft-tissue]
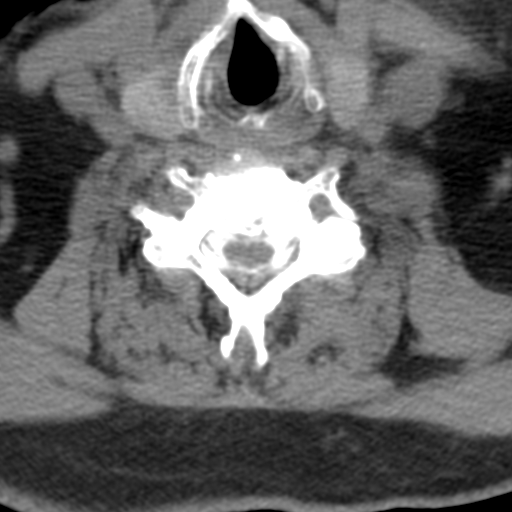
[im 56/84  soft-tissue]
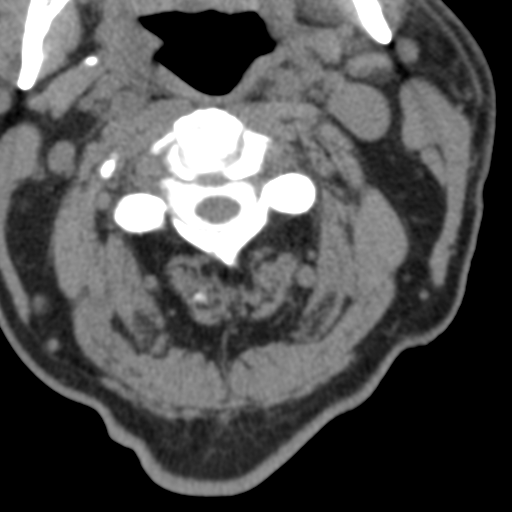

[Series 9: angled axial soft · axial · 0.29mm/px · z∈[-227,-141]mm · 3 of 89 slices shown]
[im 23/89  soft-tissue]
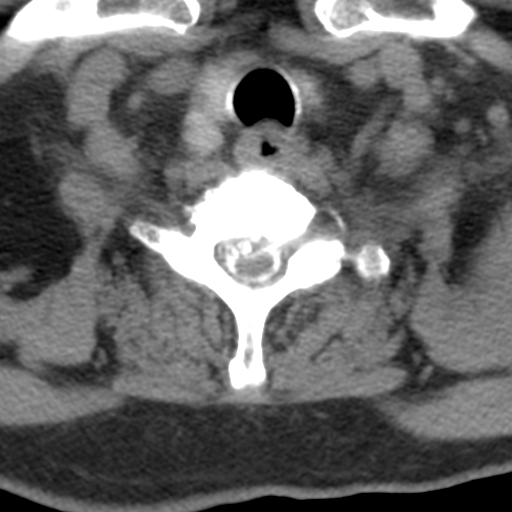
[im 45/89  soft-tissue]
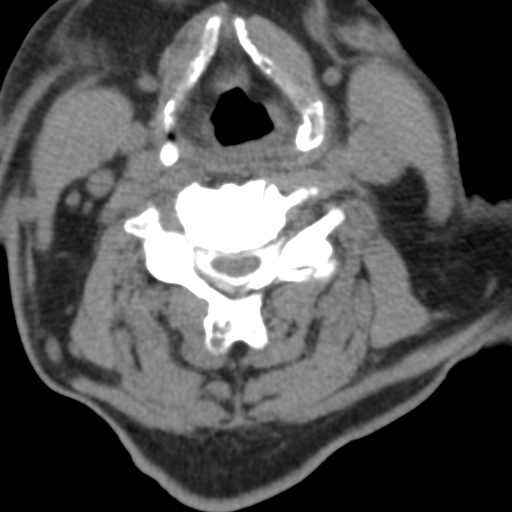
[im 67/89  soft-tissue]
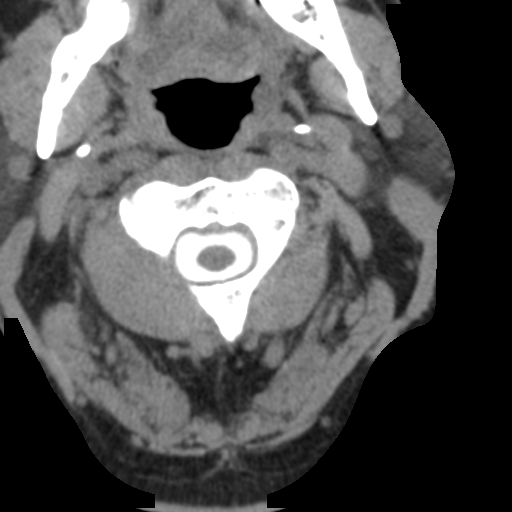

[Series 10: angled axial bone · axial · 0.24mm/px · z∈[-225,-138]mm · 3 of 89 slices shown, 4 images]
[im 23/89  soft-tissue]
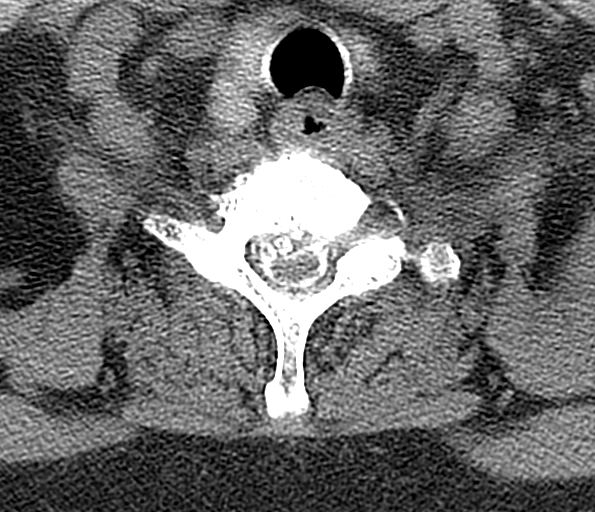
[im 23/89  bone]
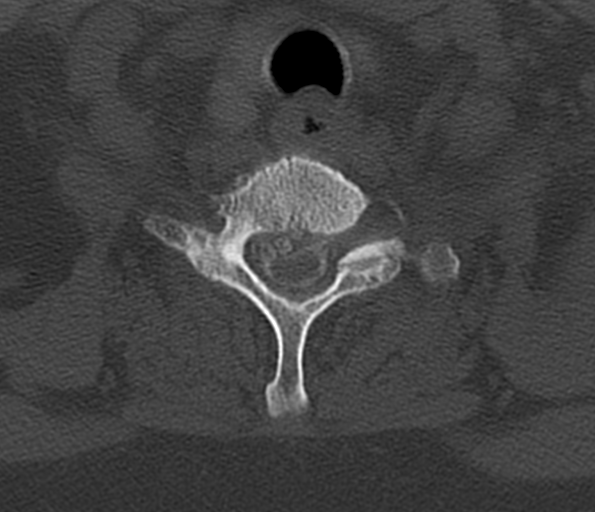
[im 45/89  bone]
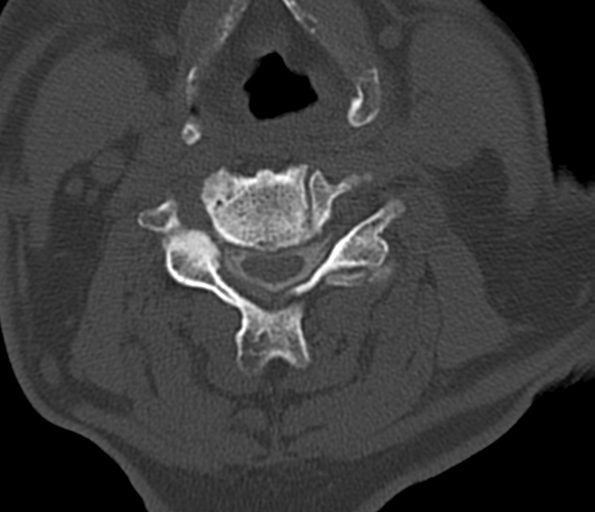
[im 67/89  bone]
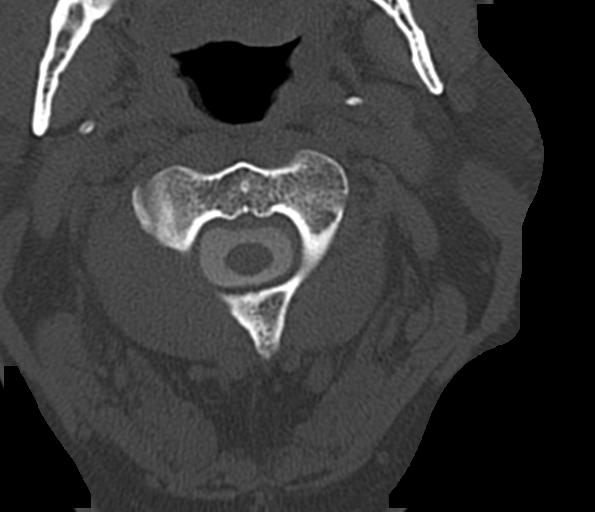

[8 of 14 positions shown; findings below may reference images not displayed]

FLUOROSCOPY TIME:  Fluoroscopy Time: 3 minutes 39 seconds

Radiation Exposure Index: 1269.50 microGray*m^2

PROCEDURE:
LUMBAR PUNCTURE FOR CERVICAL LUMBAR AND THORACIC MYELOGRAM

CERVICAL AND LUMBAR AND THORACIC MYELOGRAM

CT CERVICAL MYELOGRAM

CT LUMBAR MYELOGRAM

CT THORACIC MYELOGRAM

After thorough discussion of risks and benefits of the procedure
including bleeding, infection, injury to nerves, blood vessels,
adjacent structures as well as headache and CSF leak, written and
oral informed consent was obtained. Consent was obtained by Dr.
OHALLORAN.

Patient was positioned prone on the fluoroscopy table. Local
anesthesia was provided with 1% lidocaine without epinephrine after
prepped and draped in the usual sterile fashion. Puncture was
performed at L5-S1 using a 3 inch 22-gauge spinal needle via a right
interlaminar approach. Using a single pass through the dura, the
needle was placed within the thecal sac, with return of clear CSF.
10 mL Isovue [RC] was injected into the thecal sac, with normal
opacification of the nerve roots and cauda equina consistent with
free flow within the subarachnoid space. The patient was then moved
to the Trendelenburg position and contrast flowed into the Thoracic
and Cervical spine regions.

I personally performed the lumbar puncture and administered the
intrathecal contrast. I also personally supervised acquisition of
the myelogram images.
FINDINGS: CERVICAL, THORACIC, AND LUMBAR MYELOGRAM FINDINGS:

Assessment for potential stenosis in the mid and lower cervical
spine is limited by poor contrast visualization on conventional
myelogram images as well as superimposed shoulder tissues, and
assessment is deferred to CT.

There are scattered small ventral extradural defects in the mid
upper thoracic spine and moderate-sized defects in the lower
thoracic spine without evidence of a high-grade spinal stenosis.

There are 5 non rib-bearing lumbar type vertebrae. Lumbar vertebral
alignment is normal, and no abnormal motion is evident on flexion or
extension radiographs. L2-S1 posterior fusion is noted, and there
are ventral extradural defects at L2-3, L3-4, and L4-5 resulting in
likely mild spinal stenosis at L2-3. There is a larger ventral
extradural defect at L1-2 resulting in severe spinal stenosis.
Milder spinal stenosis is present at T12-L1.

CT CERVICAL MYELOGRAM FINDINGS:

There is chronic mild reversal of the normal cervical lordosis with
grade 1 anterolisthesis of C3 on C4 and grade 1 retrolisthesis of C4
on C5 and C6 on C7, unchanged. No acute fracture or suspicious
osseous lesion is identified. Advanced disc degeneration is present
at C4-5, C6-7 and C7-T1 with severe disc space narrowing and
prominent degenerative endplate changes at each level, greatly
progressed at C7-T1 since the prior MRI. Minimal calcified
atherosclerotic plaque is noted in the proximal right internal
carotid artery.

C2-3: Negative.

C3-4: Anterolisthesis with disc uncovering, left uncovertebral
spurring, and severe left facet arthrosis result in severe left
neural foraminal stenosis without spinal stenosis, unchanged.

C4-5: Retrolisthesis with disc uncovering/broad-based posterior disc
osteophyte complex and mild facet arthrosis result in moderate
spinal stenosis with mild-to-moderate right-sided cord flattening
and moderate right neural foraminal stenosis, unchanged.

C5-6: Negative.

C6-7: Retrolisthesis, a broad-based posterior disc osteophyte
complex, and mild facet arthrosis result in moderate spinal stenosis
with mild cord flattening and borderline to mild left neural
foraminal stenosis, unchanged.

C7-T1: Markedly progressive disc degeneration. Calcific density in
the ventral epidural space just to the right of midline behind the
C7 vertebral body may represent posterior longitudinal ligament
ossification or a chronically calcified disc protrusion and
corresponds to low signal material on the prior MRI, resulting in
mild to moderate spinal stenosis and mild right-sided cord
flattening. Mild facet arthrosis. No significant neural foraminal
stenosis.

CT THORACIC MYELOGRAM FINDINGS:

Trace anterolisthesis T2 on T3 and T3 on T4 is unchanged. No acute
fracture or suspicious osseous lesion is identified. There has been
interval T12 vertebral augmentation with cement noted in the ventral
epidural space at T12 and to a lesser extent T11. There is
moderately advanced disc degeneration throughout the mid and lower
thoracic spine. Mild thoracic aortic atherosclerosis is noted.

T1-2: Mild disc bulging and mild right and moderate left facet
arthrosis without significant stenosis, unchanged.

T2-3: Mild disc bulging and mild-to-moderate facet arthrosis without
significant stenosis, unchanged.

T3-4: Mild disc bulging and mild-to-moderate facet arthrosis without
significant stenosis, unchanged.

T4-5: Mild facet arthrosis without stenosis, unchanged.

T5-6: Mild left foraminal endplate spurring without significant
stenosis, unchanged.

T6-7: Mild disc bulging without significant stenosis, unchanged.

T7-8: A partially calcified right paracentral disc protrusion
appears slightly larger than on the prior MRI and results in mild
spinal stenosis with slight ventral cord flattening. Patent neural
foramina.

T8-9: Disc bulging, a central disc extrusion, and a right
paracentral to subarticular disc osteophyte complex result in mild
spinal stenosis, mildly progressed. Patent neural foramina.

T9-10: Disc bulging, a left paracentral disc osteophyte complex, and
moderate facet arthrosis result in mild spinal stenosis, stable to
slightly progressed. Patent neural foramina.

T10-11 disc bulging, endplate spurring, prominent dorsal epidural
fat, and mild facet arthrosis result in mild spinal stenosis, mildly
progressed. Patent neural foramina.

T11-12: Disc bulging and mild right facet arthrosis without
significant stenosis, unchanged.

CT LUMBAR MYELOGRAM FINDINGS:

Vertebral alignment is normal. No acute fracture or suspicious
osseous lesion is evident. Sequelae of L2-S1 posterior fusion are
again identified. Pedicle screws remain in place bilaterally and
appear well-positioned without evidence of loosening. There is solid
posterolateral osseous fusion from L2-S1, and there is also
interbody ankylosis from L2-L5.

There has been interval vertebral augmentation at L1. Extensive
sclerosis throughout the L1 vertebral body as well as along the T12
inferior and L2 superior endplates is likely degenerative, with
severe disc space narrowing and vacuum disc noted at T12-L1 and
L1-2.

The conus medullaris terminates at L1-2. There is abdominal aortic
atherosclerosis. Postoperative changes are noted in the posterior
lumbar soft tissues.

T12-L1: Disc bulging, endplate spurring, severe disc space height
loss, prominent dorsal epidural fat and mild facet and ligamentum
flavum hypertrophy result in mild spinal stenosis and severe right
neural foraminal stenosis, similar to the prior MRI.

L1-2: Markedly progressive disc degeneration since the prior MRI.
Circumferential disc bulging, a suspected calcified left paracentral
and subarticular disc protrusion, severe facet and ligamentum flavum
hypertrophy, and severe disc space height loss result in severe
spinal stenosis and moderate bilateral neural foraminal stenosis.

L2-3: Prior posterior decompression and fusion. Diffuse osteophytic
ridging and facet hypertrophy result in mild residual spinal
stenosis and mild right neural foraminal stenosis, unchanged.

L3-4: Prior posterior decompression and fusion. Widely patent spinal
canal. Unchanged mild bilateral neural foraminal stenosis due to
endplate spurring and facet hypertrophy.

L4-5: Prior posterior decompression and fusion. Widely patent spinal
canal. Unchanged mild bilateral neural foraminal stenosis due to
endplate spurring and facet hypertrophy.

L5-S1: Prior posterior decompression and fusion. Widely patent
spinal canal disc bulging, endplate spurring, and facet hypertrophy
result in minimal to mild bilateral neural foraminal stenosis,
unchanged.
IMPRESSION: Cervical spine:

1. Unchanged moderate spinal stenosis at C4-5 and C6-7.
2. Markedly progressive disc degeneration at C7-T1 since [DATE].
Posterior longitudinal ligament ossification or chronically
calcified disc protrusion behind the C7 vertebral body resulting in
mild to moderate spinal stenosis.
3. Severe left neural foraminal stenosis at C3-4 and moderate right
neural foraminal stenosis at C4-5.

Thoracic spine:

1. Moderate widespread thoracic disc and facet degeneration, mildly
progressed from [RC].
2. Mild spinal stenosis from T7-8 to T10-11.

Lumbar spine:

1. Solid L2-S1 fusion without residual compressive stenosis.
2. Markedly progressive adjacent segment disease at L1-2 with severe
spinal stenosis and moderate bilateral neural foraminal stenosis.
3. Mild spinal stenosis and severe right neural foraminal stenosis
at T12-L1.

## 2020-09-03 IMAGING — CT CT L SPINE W/ CM
1 of 8 series · 5 of 14 positions shown, 7 images · non-contrast
Comparison: Outside cervical spine MRI [DATE], thoracic spine
MRI [DATE], and lumbar spine MRI [DATE]

CLINICAL DATA: Neck and left greater than right arm pain. Pain in
the low back, buttocks, hips, and groin.
TECHNIQUE: Contiguous axial images were obtained through the cervical,
thoracic, and lumbar spine after the intrathecal infusion of
contrast. Coronal and sagittal reconstructions were obtained of the
axial image sets.

[Series 3: l spine soft · axial · 0.33mm/px · z∈[-605,-459]mm · 5 of 111 slices shown, 7 images]
[im 19/111  soft-tissue]
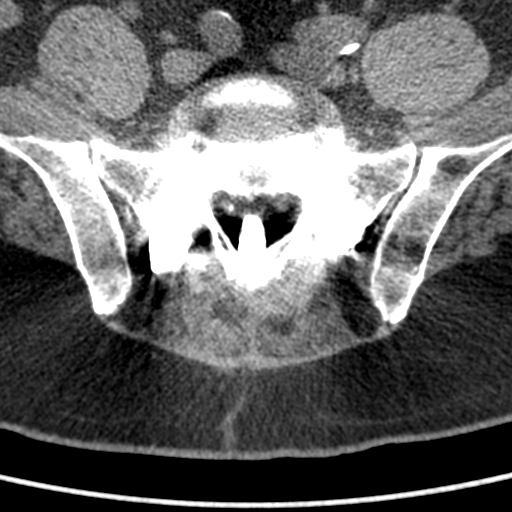
[im 19/111  bone]
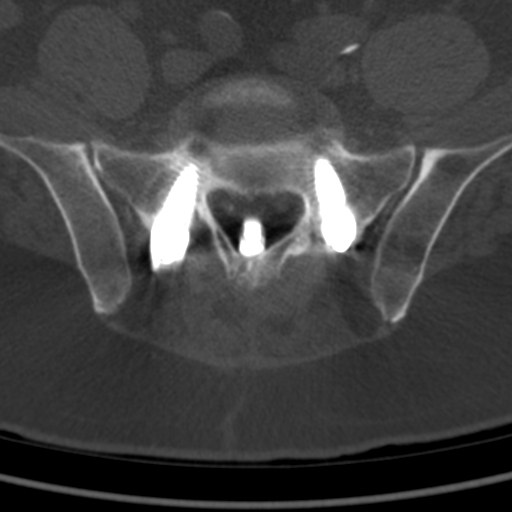
[im 37/111  bone]
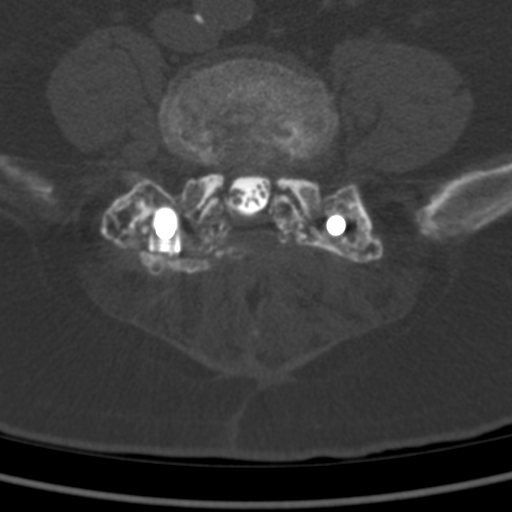
[im 56/111  bone]
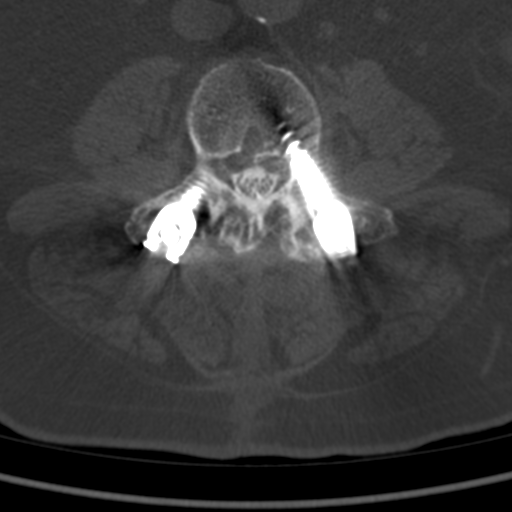
[im 74/111  bone]
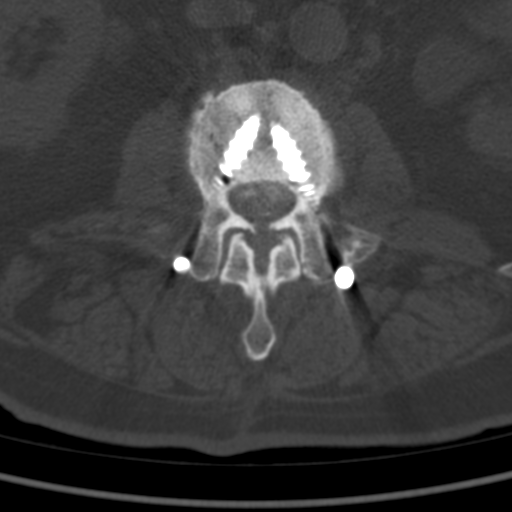
[im 92/111  soft-tissue]
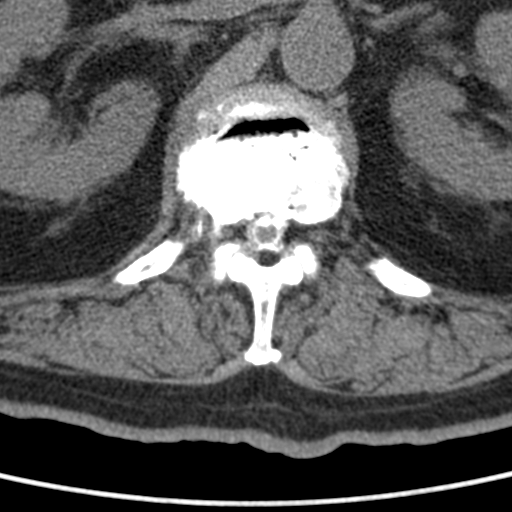
[im 92/111  bone]
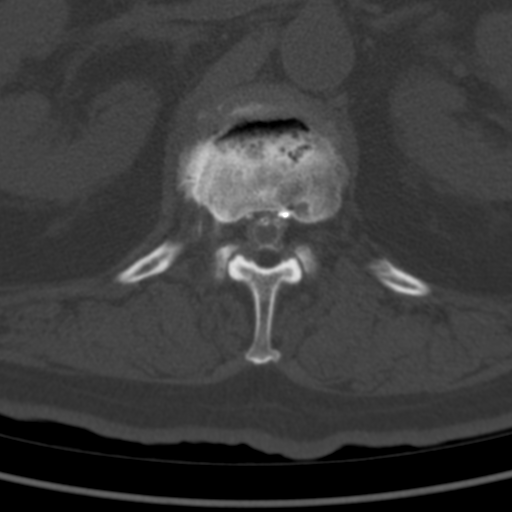

[5 of 14 positions shown; findings below may reference images not displayed]

FLUOROSCOPY TIME:  Fluoroscopy Time: 3 minutes 39 seconds

Radiation Exposure Index: 1269.50 microGray*m^2

PROCEDURE:
LUMBAR PUNCTURE FOR CERVICAL LUMBAR AND THORACIC MYELOGRAM

CERVICAL AND LUMBAR AND THORACIC MYELOGRAM

CT CERVICAL MYELOGRAM

CT LUMBAR MYELOGRAM

CT THORACIC MYELOGRAM

After thorough discussion of risks and benefits of the procedure
including bleeding, infection, injury to nerves, blood vessels,
adjacent structures as well as headache and CSF leak, written and
oral informed consent was obtained. Consent was obtained by Dr.
OHALLORAN.

Patient was positioned prone on the fluoroscopy table. Local
anesthesia was provided with 1% lidocaine without epinephrine after
prepped and draped in the usual sterile fashion. Puncture was
performed at L5-S1 using a 3 inch 22-gauge spinal needle via a right
interlaminar approach. Using a single pass through the dura, the
needle was placed within the thecal sac, with return of clear CSF.
10 mL Isovue [RC] was injected into the thecal sac, with normal
opacification of the nerve roots and cauda equina consistent with
free flow within the subarachnoid space. The patient was then moved
to the Trendelenburg position and contrast flowed into the Thoracic
and Cervical spine regions.

I personally performed the lumbar puncture and administered the
intrathecal contrast. I also personally supervised acquisition of
the myelogram images.
FINDINGS: CERVICAL, THORACIC, AND LUMBAR MYELOGRAM FINDINGS:

Assessment for potential stenosis in the mid and lower cervical
spine is limited by poor contrast visualization on conventional
myelogram images as well as superimposed shoulder tissues, and
assessment is deferred to CT.

There are scattered small ventral extradural defects in the mid
upper thoracic spine and moderate-sized defects in the lower
thoracic spine without evidence of a high-grade spinal stenosis.

There are 5 non rib-bearing lumbar type vertebrae. Lumbar vertebral
alignment is normal, and no abnormal motion is evident on flexion or
extension radiographs. L2-S1 posterior fusion is noted, and there
are ventral extradural defects at L2-3, L3-4, and L4-5 resulting in
likely mild spinal stenosis at L2-3. There is a larger ventral
extradural defect at L1-2 resulting in severe spinal stenosis.
Milder spinal stenosis is present at T12-L1.

CT CERVICAL MYELOGRAM FINDINGS:

There is chronic mild reversal of the normal cervical lordosis with
grade 1 anterolisthesis of C3 on C4 and grade 1 retrolisthesis of C4
on C5 and C6 on C7, unchanged. No acute fracture or suspicious
osseous lesion is identified. Advanced disc degeneration is present
at C4-5, C6-7 and C7-T1 with severe disc space narrowing and
prominent degenerative endplate changes at each level, greatly
progressed at C7-T1 since the prior MRI. Minimal calcified
atherosclerotic plaque is noted in the proximal right internal
carotid artery.

C2-3: Negative.

C3-4: Anterolisthesis with disc uncovering, left uncovertebral
spurring, and severe left facet arthrosis result in severe left
neural foraminal stenosis without spinal stenosis, unchanged.

C4-5: Retrolisthesis with disc uncovering/broad-based posterior disc
osteophyte complex and mild facet arthrosis result in moderate
spinal stenosis with mild-to-moderate right-sided cord flattening
and moderate right neural foraminal stenosis, unchanged.

C5-6: Negative.

C6-7: Retrolisthesis, a broad-based posterior disc osteophyte
complex, and mild facet arthrosis result in moderate spinal stenosis
with mild cord flattening and borderline to mild left neural
foraminal stenosis, unchanged.

C7-T1: Markedly progressive disc degeneration. Calcific density in
the ventral epidural space just to the right of midline behind the
C7 vertebral body may represent posterior longitudinal ligament
ossification or a chronically calcified disc protrusion and
corresponds to low signal material on the prior MRI, resulting in
mild to moderate spinal stenosis and mild right-sided cord
flattening. Mild facet arthrosis. No significant neural foraminal
stenosis.

CT THORACIC MYELOGRAM FINDINGS:

Trace anterolisthesis T2 on T3 and T3 on T4 is unchanged. No acute
fracture or suspicious osseous lesion is identified. There has been
interval T12 vertebral augmentation with cement noted in the ventral
epidural space at T12 and to a lesser extent T11. There is
moderately advanced disc degeneration throughout the mid and lower
thoracic spine. Mild thoracic aortic atherosclerosis is noted.

T1-2: Mild disc bulging and mild right and moderate left facet
arthrosis without significant stenosis, unchanged.

T2-3: Mild disc bulging and mild-to-moderate facet arthrosis without
significant stenosis, unchanged.

T3-4: Mild disc bulging and mild-to-moderate facet arthrosis without
significant stenosis, unchanged.

T4-5: Mild facet arthrosis without stenosis, unchanged.

T5-6: Mild left foraminal endplate spurring without significant
stenosis, unchanged.

T6-7: Mild disc bulging without significant stenosis, unchanged.

T7-8: A partially calcified right paracentral disc protrusion
appears slightly larger than on the prior MRI and results in mild
spinal stenosis with slight ventral cord flattening. Patent neural
foramina.

T8-9: Disc bulging, a central disc extrusion, and a right
paracentral to subarticular disc osteophyte complex result in mild
spinal stenosis, mildly progressed. Patent neural foramina.

T9-10: Disc bulging, a left paracentral disc osteophyte complex, and
moderate facet arthrosis result in mild spinal stenosis, stable to
slightly progressed. Patent neural foramina.

T10-11 disc bulging, endplate spurring, prominent dorsal epidural
fat, and mild facet arthrosis result in mild spinal stenosis, mildly
progressed. Patent neural foramina.

T11-12: Disc bulging and mild right facet arthrosis without
significant stenosis, unchanged.

CT LUMBAR MYELOGRAM FINDINGS:

Vertebral alignment is normal. No acute fracture or suspicious
osseous lesion is evident. Sequelae of L2-S1 posterior fusion are
again identified. Pedicle screws remain in place bilaterally and
appear well-positioned without evidence of loosening. There is solid
posterolateral osseous fusion from L2-S1, and there is also
interbody ankylosis from L2-L5.

There has been interval vertebral augmentation at L1. Extensive
sclerosis throughout the L1 vertebral body as well as along the T12
inferior and L2 superior endplates is likely degenerative, with
severe disc space narrowing and vacuum disc noted at T12-L1 and
L1-2.

The conus medullaris terminates at L1-2. There is abdominal aortic
atherosclerosis. Postoperative changes are noted in the posterior
lumbar soft tissues.

T12-L1: Disc bulging, endplate spurring, severe disc space height
loss, prominent dorsal epidural fat and mild facet and ligamentum
flavum hypertrophy result in mild spinal stenosis and severe right
neural foraminal stenosis, similar to the prior MRI.

L1-2: Markedly progressive disc degeneration since the prior MRI.
Circumferential disc bulging, a suspected calcified left paracentral
and subarticular disc protrusion, severe facet and ligamentum flavum
hypertrophy, and severe disc space height loss result in severe
spinal stenosis and moderate bilateral neural foraminal stenosis.

L2-3: Prior posterior decompression and fusion. Diffuse osteophytic
ridging and facet hypertrophy result in mild residual spinal
stenosis and mild right neural foraminal stenosis, unchanged.

L3-4: Prior posterior decompression and fusion. Widely patent spinal
canal. Unchanged mild bilateral neural foraminal stenosis due to
endplate spurring and facet hypertrophy.

L4-5: Prior posterior decompression and fusion. Widely patent spinal
canal. Unchanged mild bilateral neural foraminal stenosis due to
endplate spurring and facet hypertrophy.

L5-S1: Prior posterior decompression and fusion. Widely patent
spinal canal disc bulging, endplate spurring, and facet hypertrophy
result in minimal to mild bilateral neural foraminal stenosis,
unchanged.
IMPRESSION: Cervical spine:

1. Unchanged moderate spinal stenosis at C4-5 and C6-7.
2. Markedly progressive disc degeneration at C7-T1 since [DATE].
Posterior longitudinal ligament ossification or chronically
calcified disc protrusion behind the C7 vertebral body resulting in
mild to moderate spinal stenosis.
3. Severe left neural foraminal stenosis at C3-4 and moderate right
neural foraminal stenosis at C4-5.

Thoracic spine:

1. Moderate widespread thoracic disc and facet degeneration, mildly
progressed from [RC].
2. Mild spinal stenosis from T7-8 to T10-11.

Lumbar spine:

1. Solid L2-S1 fusion without residual compressive stenosis.
2. Markedly progressive adjacent segment disease at L1-2 with severe
spinal stenosis and moderate bilateral neural foraminal stenosis.
3. Mild spinal stenosis and severe right neural foraminal stenosis
at T12-L1.

## 2020-09-03 IMAGING — CT CT T SPINE W/ CM
2 series · 9 of 14 positions shown, 11 images · non-contrast
Comparison: Outside cervical spine MRI [DATE], thoracic spine
MRI [DATE], and lumbar spine MRI [DATE]

CLINICAL DATA: Neck and left greater than right arm pain. Pain in
the low back, buttocks, hips, and groin.
TECHNIQUE: Contiguous axial images were obtained through the cervical,
thoracic, and lumbar spine after the intrathecal infusion of
contrast. Coronal and sagittal reconstructions were obtained of the
axial image sets.

[Series 3: t spine soft · axial · 0.34mm/px · z∈[-396,-276]mm · 3 of 100 slices shown]
[im 20/100  soft-tissue]
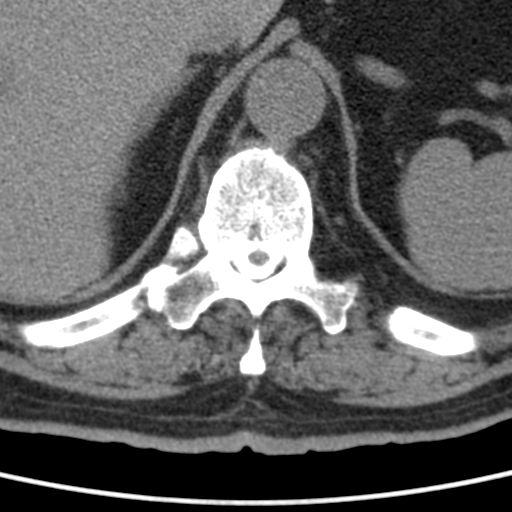
[im 40/100  soft-tissue]
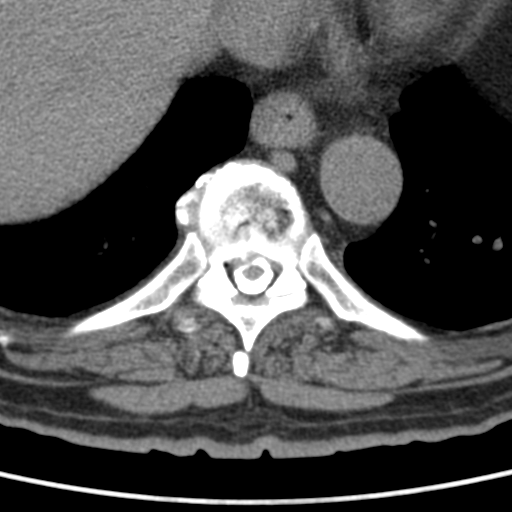
[im 60/100  soft-tissue]
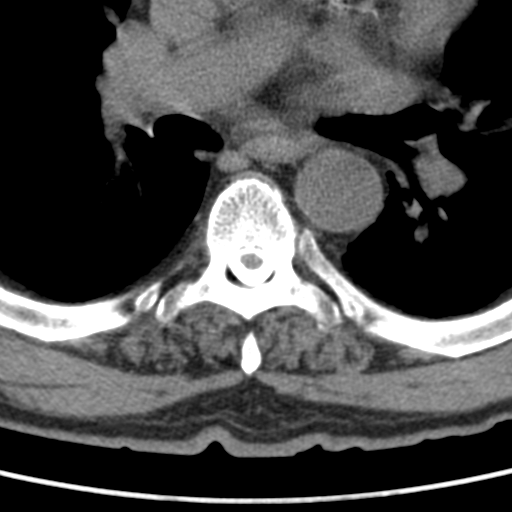

[Series 10: angled axial -lower · axial · 0.29mm/px · z∈[-400,-191]mm · 6 of 149 slices shown, 8 images]
[im 22/149  soft-tissue]
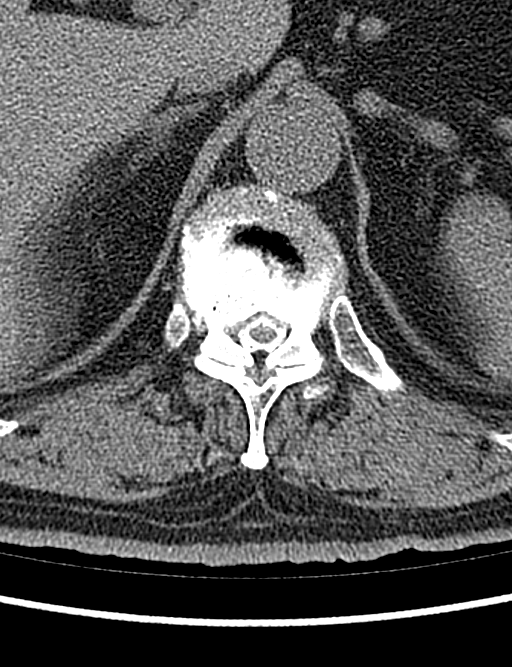
[im 22/149  bone]
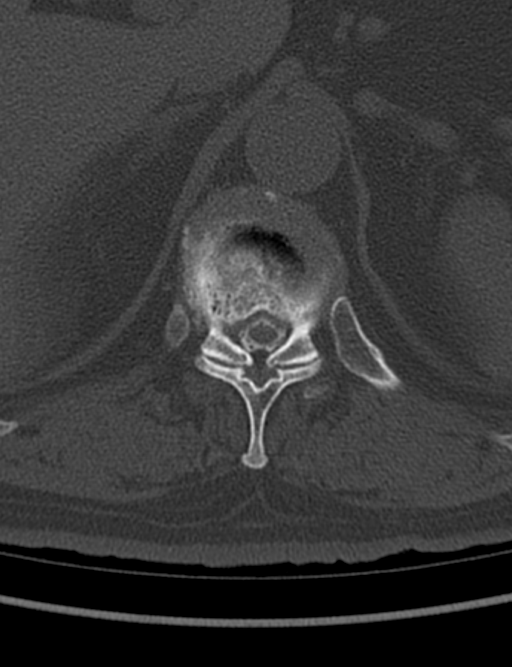
[im 43/149  bone]
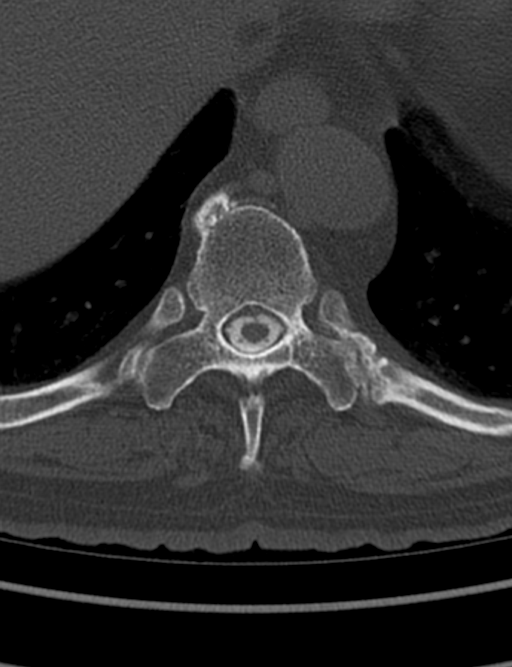
[im 64/149  bone]
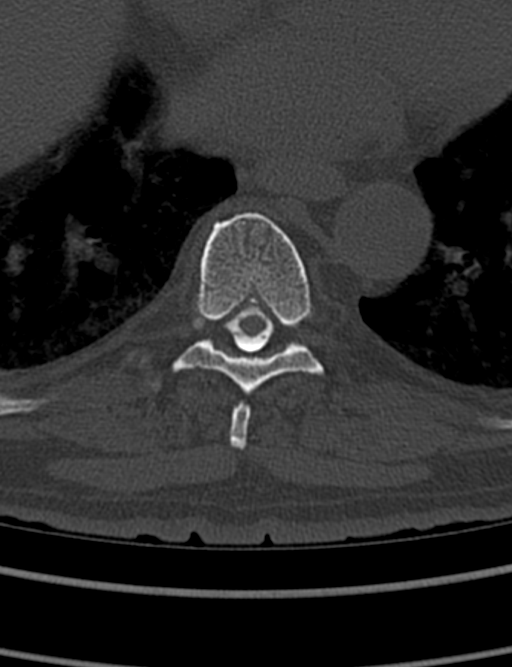
[im 85/149  bone]
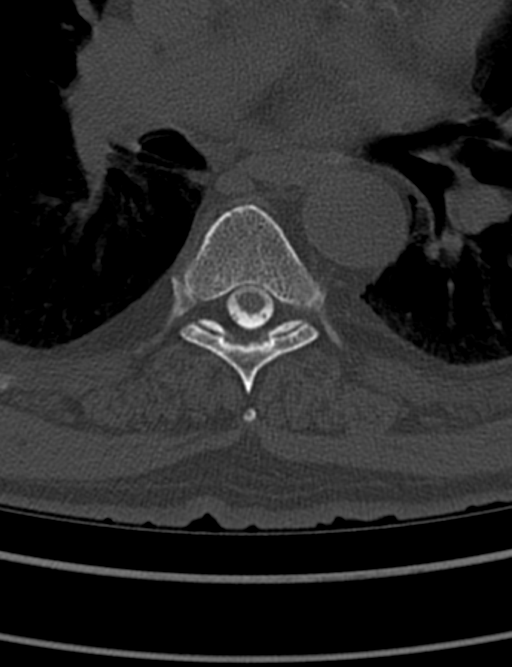
[im 106/149  soft-tissue]
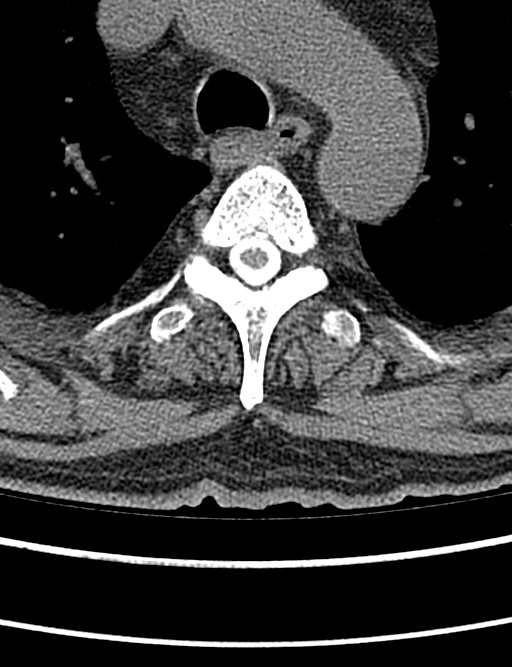
[im 106/149  bone]
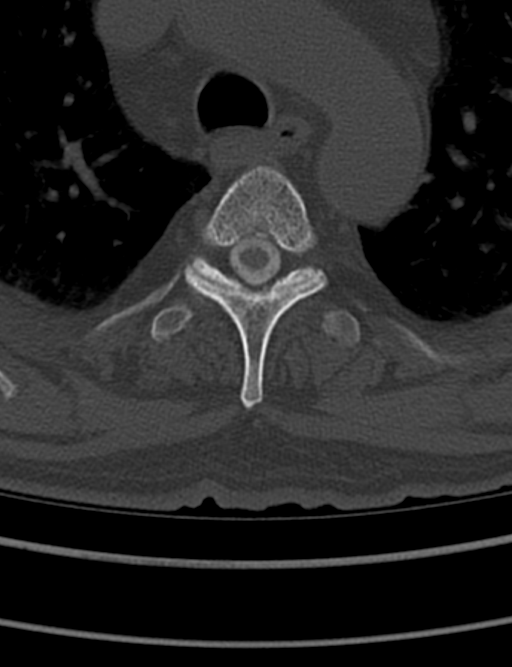
[im 127/149  bone]
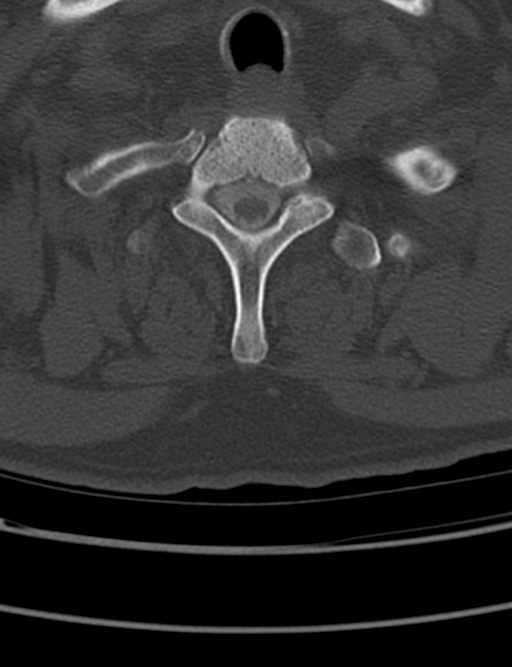

[9 of 14 positions shown; findings below may reference images not displayed]

FLUOROSCOPY TIME:  Fluoroscopy Time: 3 minutes 39 seconds

Radiation Exposure Index: 1269.50 microGray*m^2

PROCEDURE:
LUMBAR PUNCTURE FOR CERVICAL LUMBAR AND THORACIC MYELOGRAM

CERVICAL AND LUMBAR AND THORACIC MYELOGRAM

CT CERVICAL MYELOGRAM

CT LUMBAR MYELOGRAM

CT THORACIC MYELOGRAM

After thorough discussion of risks and benefits of the procedure
including bleeding, infection, injury to nerves, blood vessels,
adjacent structures as well as headache and CSF leak, written and
oral informed consent was obtained. Consent was obtained by Dr.
OHALLORAN.

Patient was positioned prone on the fluoroscopy table. Local
anesthesia was provided with 1% lidocaine without epinephrine after
prepped and draped in the usual sterile fashion. Puncture was
performed at L5-S1 using a 3 inch 22-gauge spinal needle via a right
interlaminar approach. Using a single pass through the dura, the
needle was placed within the thecal sac, with return of clear CSF.
10 mL Isovue [RC] was injected into the thecal sac, with normal
opacification of the nerve roots and cauda equina consistent with
free flow within the subarachnoid space. The patient was then moved
to the Trendelenburg position and contrast flowed into the Thoracic
and Cervical spine regions.

I personally performed the lumbar puncture and administered the
intrathecal contrast. I also personally supervised acquisition of
the myelogram images.
FINDINGS: CERVICAL, THORACIC, AND LUMBAR MYELOGRAM FINDINGS:

Assessment for potential stenosis in the mid and lower cervical
spine is limited by poor contrast visualization on conventional
myelogram images as well as superimposed shoulder tissues, and
assessment is deferred to CT.

There are scattered small ventral extradural defects in the mid
upper thoracic spine and moderate-sized defects in the lower
thoracic spine without evidence of a high-grade spinal stenosis.

There are 5 non rib-bearing lumbar type vertebrae. Lumbar vertebral
alignment is normal, and no abnormal motion is evident on flexion or
extension radiographs. L2-S1 posterior fusion is noted, and there
are ventral extradural defects at L2-3, L3-4, and L4-5 resulting in
likely mild spinal stenosis at L2-3. There is a larger ventral
extradural defect at L1-2 resulting in severe spinal stenosis.
Milder spinal stenosis is present at T12-L1.

CT CERVICAL MYELOGRAM FINDINGS:

There is chronic mild reversal of the normal cervical lordosis with
grade 1 anterolisthesis of C3 on C4 and grade 1 retrolisthesis of C4
on C5 and C6 on C7, unchanged. No acute fracture or suspicious
osseous lesion is identified. Advanced disc degeneration is present
at C4-5, C6-7 and C7-T1 with severe disc space narrowing and
prominent degenerative endplate changes at each level, greatly
progressed at C7-T1 since the prior MRI. Minimal calcified
atherosclerotic plaque is noted in the proximal right internal
carotid artery.

C2-3: Negative.

C3-4: Anterolisthesis with disc uncovering, left uncovertebral
spurring, and severe left facet arthrosis result in severe left
neural foraminal stenosis without spinal stenosis, unchanged.

C4-5: Retrolisthesis with disc uncovering/broad-based posterior disc
osteophyte complex and mild facet arthrosis result in moderate
spinal stenosis with mild-to-moderate right-sided cord flattening
and moderate right neural foraminal stenosis, unchanged.

C5-6: Negative.

C6-7: Retrolisthesis, a broad-based posterior disc osteophyte
complex, and mild facet arthrosis result in moderate spinal stenosis
with mild cord flattening and borderline to mild left neural
foraminal stenosis, unchanged.

C7-T1: Markedly progressive disc degeneration. Calcific density in
the ventral epidural space just to the right of midline behind the
C7 vertebral body may represent posterior longitudinal ligament
ossification or a chronically calcified disc protrusion and
corresponds to low signal material on the prior MRI, resulting in
mild to moderate spinal stenosis and mild right-sided cord
flattening. Mild facet arthrosis. No significant neural foraminal
stenosis.

CT THORACIC MYELOGRAM FINDINGS:

Trace anterolisthesis T2 on T3 and T3 on T4 is unchanged. No acute
fracture or suspicious osseous lesion is identified. There has been
interval T12 vertebral augmentation with cement noted in the ventral
epidural space at T12 and to a lesser extent T11. There is
moderately advanced disc degeneration throughout the mid and lower
thoracic spine. Mild thoracic aortic atherosclerosis is noted.

T1-2: Mild disc bulging and mild right and moderate left facet
arthrosis without significant stenosis, unchanged.

T2-3: Mild disc bulging and mild-to-moderate facet arthrosis without
significant stenosis, unchanged.

T3-4: Mild disc bulging and mild-to-moderate facet arthrosis without
significant stenosis, unchanged.

T4-5: Mild facet arthrosis without stenosis, unchanged.

T5-6: Mild left foraminal endplate spurring without significant
stenosis, unchanged.

T6-7: Mild disc bulging without significant stenosis, unchanged.

T7-8: A partially calcified right paracentral disc protrusion
appears slightly larger than on the prior MRI and results in mild
spinal stenosis with slight ventral cord flattening. Patent neural
foramina.

T8-9: Disc bulging, a central disc extrusion, and a right
paracentral to subarticular disc osteophyte complex result in mild
spinal stenosis, mildly progressed. Patent neural foramina.

T9-10: Disc bulging, a left paracentral disc osteophyte complex, and
moderate facet arthrosis result in mild spinal stenosis, stable to
slightly progressed. Patent neural foramina.

T10-11 disc bulging, endplate spurring, prominent dorsal epidural
fat, and mild facet arthrosis result in mild spinal stenosis, mildly
progressed. Patent neural foramina.

T11-12: Disc bulging and mild right facet arthrosis without
significant stenosis, unchanged.

CT LUMBAR MYELOGRAM FINDINGS:

Vertebral alignment is normal. No acute fracture or suspicious
osseous lesion is evident. Sequelae of L2-S1 posterior fusion are
again identified. Pedicle screws remain in place bilaterally and
appear well-positioned without evidence of loosening. There is solid
posterolateral osseous fusion from L2-S1, and there is also
interbody ankylosis from L2-L5.

There has been interval vertebral augmentation at L1. Extensive
sclerosis throughout the L1 vertebral body as well as along the T12
inferior and L2 superior endplates is likely degenerative, with
severe disc space narrowing and vacuum disc noted at T12-L1 and
L1-2.

The conus medullaris terminates at L1-2. There is abdominal aortic
atherosclerosis. Postoperative changes are noted in the posterior
lumbar soft tissues.

T12-L1: Disc bulging, endplate spurring, severe disc space height
loss, prominent dorsal epidural fat and mild facet and ligamentum
flavum hypertrophy result in mild spinal stenosis and severe right
neural foraminal stenosis, similar to the prior MRI.

L1-2: Markedly progressive disc degeneration since the prior MRI.
Circumferential disc bulging, a suspected calcified left paracentral
and subarticular disc protrusion, severe facet and ligamentum flavum
hypertrophy, and severe disc space height loss result in severe
spinal stenosis and moderate bilateral neural foraminal stenosis.

L2-3: Prior posterior decompression and fusion. Diffuse osteophytic
ridging and facet hypertrophy result in mild residual spinal
stenosis and mild right neural foraminal stenosis, unchanged.

L3-4: Prior posterior decompression and fusion. Widely patent spinal
canal. Unchanged mild bilateral neural foraminal stenosis due to
endplate spurring and facet hypertrophy.

L4-5: Prior posterior decompression and fusion. Widely patent spinal
canal. Unchanged mild bilateral neural foraminal stenosis due to
endplate spurring and facet hypertrophy.

L5-S1: Prior posterior decompression and fusion. Widely patent
spinal canal disc bulging, endplate spurring, and facet hypertrophy
result in minimal to mild bilateral neural foraminal stenosis,
unchanged.
IMPRESSION: Cervical spine:

1. Unchanged moderate spinal stenosis at C4-5 and C6-7.
2. Markedly progressive disc degeneration at C7-T1 since [DATE].
Posterior longitudinal ligament ossification or chronically
calcified disc protrusion behind the C7 vertebral body resulting in
mild to moderate spinal stenosis.
3. Severe left neural foraminal stenosis at C3-4 and moderate right
neural foraminal stenosis at C4-5.

Thoracic spine:

1. Moderate widespread thoracic disc and facet degeneration, mildly
progressed from [RC].
2. Mild spinal stenosis from T7-8 to T10-11.

Lumbar spine:

1. Solid L2-S1 fusion without residual compressive stenosis.
2. Markedly progressive adjacent segment disease at L1-2 with severe
spinal stenosis and moderate bilateral neural foraminal stenosis.
3. Mild spinal stenosis and severe right neural foraminal stenosis
at T12-L1.

## 2020-09-03 MED ORDER — IOPAMIDOL (ISOVUE-M 300) INJECTION 61%
10.0000 mL | Freq: Once | INTRAMUSCULAR | Status: AC
  Administered 2020-09-03: 10 mL via INTRATHECAL

## 2020-09-03 MED ORDER — DIAZEPAM 5 MG PO TABS
10.0000 mg | ORAL_TABLET | Freq: Once | ORAL | Status: AC
  Administered 2020-09-03: 09:00:00 10 mg via ORAL

## 2020-09-03 MED ORDER — MEPERIDINE HCL 50 MG/ML IJ SOLN
50.0000 mg | Freq: Once | INTRAMUSCULAR | Status: AC
  Administered 2020-09-03: 50 mg via INTRAMUSCULAR

## 2020-09-03 MED ORDER — ONDANSETRON HCL 4 MG/2ML IJ SOLN
4.0000 mg | Freq: Once | INTRAMUSCULAR | Status: AC
  Administered 2020-09-03: 4 mg via INTRAMUSCULAR

## 2020-09-03 NOTE — Discharge Instr - Other Orders (Signed)
1040: pt c/o pain 8/10 in lower back and lower extremities form myelogram procedure. See MAR.

## 2020-09-03 NOTE — Discharge Instructions (Addendum)
Myelogram Discharge Instructions  Go home and rest quietly as needed. You may resume normal activities; however, do not exert yourself strongly or do any heavy lifting today and tomorrow.   DO NOT drive today.    You may resume your normal diet and medications unless otherwise indicated. Drink lots of extra fluids today and tomorrow.   The incidence of headache, nausea, or vomiting is about 5% (one in 20 patients).  If you develop a headache, lie flat for 24 hours and drink plenty of fluids until the headache goes away.  Caffeinated beverages may be helpful. If when you get up you still have a headache when standing, go back to bed and force fluids for another 24 hours.   If you develop severe nausea and vomiting or a headache that does not go away with the flat bedrest after 48 hours, please call [phone].   Call your physician for a follow-up appointment.  The results of your myelogram will be sent directly to your physician by the following day.  If you have any questions or if complications develop after you arrive home, please call [phone].  Discharge instructions have been explained to the patient.  The patient, or the person responsible for the patient, fully understands these instructions.   Thank you for visiting our office today.    YOU MAY RESUME YOUR PAXIL TOMORROW 09/04/20 AT 9:30 AM OR AFTER

## 2020-09-03 NOTE — Progress Notes (Signed)
Pt reports he has been off of his Paxil for at least 48 hours.

## 2020-09-18 ENCOUNTER — Other Ambulatory Visit: Payer: Self-pay | Admitting: Orthopaedic Surgery

## 2020-09-18 DIAGNOSIS — G8929 Other chronic pain: Secondary | ICD-10-CM

## 2020-10-01 ENCOUNTER — Other Ambulatory Visit: Payer: Self-pay | Admitting: Neurosurgery

## 2020-10-19 NOTE — Progress Notes (Signed)
Surgical Instructions    Your procedure is scheduled on Wednesday, August 24th, 2022.   Report to [facility] Main Entrance "A" at 10:45 A.M., then check in with the Admitting office.  Call this number if you have problems the morning of surgery:  [phone]   If you have any questions prior to your surgery date call [phone]: Open Monday-Friday 8am-4pm    Remember:  Do not eat or drink after midnight the night before your surgery    Take these medicines the morning of surgery with A SIP OF WATER:  atorvastatin (LIPITOR) Carbidopa-Levodopa ER  hydrOXYzine (ATARAX/VISTARIL) LORazepam (ATIVAN)  metoprolol succinate (TOPROL XL)  pantoprazole (PROTONIX) PARoxetine (PAXIL)  phenytoin (DILANTIN)   If needed:  albuterol (VENTOLIN HFA) - please, bring the inhaler with you the day of surgery guaiFENesin-dextromethorphan (ROBITUSSIN DM)  sodium chloride (MURO 128)  Follow your surgeon's instructions on when to stop Aspirin.  If no instructions were given by your surgeon then you will need to call the office to get those instructions.     As of today, STOP taking any Aspirin (unless otherwise instructed by your surgeon) Aleve, Naproxen, Ibuprofen, Motrin, Advil, Goody's, BC's, all herbal medications, fish oil, and all vitamins.  WHAT DO I DO ABOUT MY DIABETES MEDICATION?   Do not take metFORMIN (GLUCOPHAGE) the morning of surgery.   HOW TO MANAGE YOUR DIABETES BEFORE AND AFTER SURGERY  Why is it important to control my blood sugar before and after surgery? Improving blood sugar levels before and after surgery helps healing and can limit problems. A way of improving blood sugar control is eating a healthy diet by:  Eating less sugar and carbohydrates  Increasing activity/exercise  Talking with your doctor about reaching your blood sugar goals High blood sugars (greater than 180 mg/dL) can raise your risk of infections and slow your recovery, so you will need to focus on  controlling your diabetes during the weeks before surgery. Make sure that the doctor who takes care of your diabetes knows about your planned surgery including the date and location.  How do I manage my blood sugar before surgery? Check your blood sugar at least 4 times a day, starting 2 days before surgery, to make sure that the level is not too high or low.  Check your blood sugar the morning of your surgery when you wake up and every 2 hours until you get to the Short Stay unit.  If your blood sugar is less than 70 mg/dL, you will need to treat for low blood sugar: Do not take insulin. Treat a low blood sugar (less than 70 mg/dL) with  cup of clear juice (cranberry or apple), 4 glucose tablets, OR glucose gel. Recheck blood sugar in 15 minutes after treatment (to make sure it is greater than 70 mg/dL). If your blood sugar is not greater than 70 mg/dL on recheck, call [phone] for further instructions. Report your blood sugar to the short stay nurse when you get to Short Stay.  If you are admitted to the hospital after surgery: Your blood sugar will be checked by the staff and you will probably be given insulin after surgery (instead of oral diabetes medicines) to make sure you have good blood sugar levels. The goal for blood sugar control after surgery is 80-180 mg/dL.           Do not wear jewelry  Do not wear lotions, powders, colognes, or deodorant. Men may shave face and neck. Do not bring valuables  to the hospital. DO Not wear nail polish, gel polish, artificial nails, or any other type of covering on natural nails including finger and toenails. If patients have artificial nails, gel coating, etc. that need to be removed by a nail salon please have this removed prior to surgery or surgery may need to be canceled/delayed if the surgeon/ anesthesia feels like the patient is unable to be adequately monitored.             [facility] is not responsible for any belongings or  valuables.  Do NOT Smoke (Tobacco/Vaping) or drink Alcohol 24 hours prior to your procedure If you use a CPAP at night, you may bring all equipment for your overnight stay.   Contacts, glasses, dentures or bridgework may not be worn into surgery, please bring cases for these belongings   For patients admitted to the hospital, discharge time will be determined by your treatment team.   Patients discharged the day of surgery will not be allowed to drive home, and someone needs to stay with them for 24 hours.  ONLY 1 SUPPORT PERSON MAY BE PRESENT WHILE YOU ARE IN SURGERY. IF YOU ARE TO BE ADMITTED ONCE YOU ARE IN YOUR ROOM YOU WILL BE ALLOWED TWO (2) VISITORS.  Minor children may have two parents present. Special consideration for safety and communication needs will be reviewed on a case by case basis.  Special instructions:    Oral Hygiene is also important to reduce your risk of infection.  Remember - BRUSH YOUR TEETH THE MORNING OF SURGERY WITH YOUR REGULAR TOOTHPASTE   [facility]- Preparing For Surgery  Before surgery, you can play an important role. Because skin is not sterile, your skin needs to be as free of germs as possible. You can reduce the number of germs on your skin by washing with CHG (chlorahexidine gluconate) Soap before surgery.  CHG is an antiseptic cleaner which kills germs and bonds with the skin to continue killing germs even after washing.     Please do not use if you have an allergy to CHG or antibacterial soaps. If your skin becomes reddened/irritated stop using the CHG.  Do not shave (including legs and underarms) for at least 48 hours prior to first CHG shower. It is OK to shave your face.  Please follow these instructions carefully.     Shower the NIGHT BEFORE SURGERY and the MORNING OF SURGERY with CHG Soap.   If you chose to wash your hair, wash your hair first as usual with your normal shampoo. After you shampoo, rinse your hair and body thoroughly to  remove the shampoo.  Then [employer] and genitals (private parts) with your normal soap and rinse thoroughly to remove soap.  After that Use CHG Soap as you would any other liquid soap. You can apply CHG directly to the skin and wash gently with a scrungie or a clean washcloth.   Apply the CHG Soap to your body ONLY FROM THE NECK DOWN.  Do not use on open wounds or open sores. Avoid contact with your eyes, ears, mouth and genitals (private parts). Wash Face and genitals (private parts)  with your normal soap.   Wash thoroughly, paying special attention to the area where your surgery will be performed.  Thoroughly rinse your body with warm water from the neck down.  DO NOT shower/wash with your normal soap after using and rinsing off the CHG Soap.  Pat yourself dry with a CLEAN TOWEL.  Wear  CLEAN PAJAMAS to bed the night before surgery  Place CLEAN SHEETS on your bed the night before your surgery  DO NOT SLEEP WITH PETS.   Day of Surgery:  Take a shower with CHG soap. Wear Clean/Comfortable clothing the morning of surgery Do not apply any deodorants/lotions.   Remember to brush your teeth WITH YOUR REGULAR TOOTHPASTE.   Please read over the following fact sheets that you were given.

## 2020-10-22 ENCOUNTER — Other Ambulatory Visit: Payer: Self-pay

## 2020-10-22 ENCOUNTER — Encounter (HOSPITAL_COMMUNITY)
Admission: RE | Admit: 2020-10-22 | Discharge: 2020-10-22 | Disposition: A | Discharge status: Home / Self Care | Payer: No Typology Code available for payment source | Source: Ambulatory Visit | Attending: Neurosurgery | Admitting: Neurosurgery

## 2020-10-22 ENCOUNTER — Encounter (HOSPITAL_COMMUNITY): Payer: Self-pay

## 2020-10-22 ENCOUNTER — Other Ambulatory Visit: Payer: Self-pay | Admitting: Neurosurgery

## 2020-10-22 DIAGNOSIS — Z01812 Encounter for preprocedural laboratory examination: Secondary | ICD-10-CM | POA: Diagnosis present

## 2020-10-22 DIAGNOSIS — Z87891 Personal history of nicotine dependence: Secondary | ICD-10-CM | POA: Diagnosis not present

## 2020-10-22 DIAGNOSIS — Z8616 Personal history of COVID-19: Secondary | ICD-10-CM | POA: Diagnosis not present

## 2020-10-22 DIAGNOSIS — Z20822 Contact with and (suspected) exposure to covid-19: Secondary | ICD-10-CM | POA: Diagnosis not present

## 2020-10-22 HISTORY — DX: Cardiac arrhythmia, unspecified: I49.9

## 2020-10-22 LAB — SURGICAL PCR SCREEN
MRSA, PCR: NEGATIVE
Staphylococcus aureus: POSITIVE — AB

## 2020-10-22 LAB — CBC
HCT: 37 % — ABNORMAL LOW (ref 39.0–52.0)
Hemoglobin: 13.2 g/dL (ref 13.0–17.0)
MCH: 32.1 pg (ref 26.0–34.0)
MCHC: 35.7 g/dL (ref 30.0–36.0)
MCV: 90 fL (ref 80.0–100.0)
Platelets: 195 10*3/uL (ref 150–400)
RBC: 4.11 MIL/uL — ABNORMAL LOW (ref 4.22–5.81)
RDW: 13 % (ref 11.5–15.5)
WBC: 7.6 10*3/uL (ref 4.0–10.5)
nRBC: 0 % (ref 0.0–0.2)

## 2020-10-22 LAB — BASIC METABOLIC PANEL
Anion gap: 11 (ref 5–15)
BUN: 9 mg/dL (ref 8–23)
CO2: 29 mmol/L (ref 22–32)
Calcium: 9.1 mg/dL (ref 8.9–10.3)
Chloride: 90 mmol/L — ABNORMAL LOW (ref 98–111)
Creatinine, Ser: 0.7 mg/dL (ref 0.61–1.24)
GFR, Estimated: >60 mL/min (ref >60)
Glucose, Bld: 143 mg/dL — ABNORMAL HIGH (ref 70–99)
Potassium: 4 mmol/L (ref 3.5–5.1)
Sodium: 130 mmol/L — ABNORMAL LOW (ref 135–145)

## 2020-10-22 LAB — SARS CORONAVIRUS 2 (TAT 6-24 HRS): SARS Coronavirus 2: NEGATIVE

## 2020-10-22 LAB — GLUCOSE, CAPILLARY: Glucose-Capillary: 151 mg/dL — ABNORMAL HIGH (ref 70–99)

## 2020-10-22 LAB — HEMOGLOBIN A1C
Hgb A1c MFr Bld: 6.5 % — ABNORMAL HIGH (ref 4.8–5.6)
Mean Plasma Glucose: 139.85 mg/dL

## 2020-10-22 NOTE — Progress Notes (Addendum)
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 Patient agrees to go over the instructions while at home for a better understanding. Patient also instructed to wear a mask after being tested for COVID-19. The opportunity to ask questions was provided.

## 2020-10-24 NOTE — Progress Notes (Signed)
Anesthesia Chart Review:  Former smoker (15 pack years, quit 03/10/18) with h/o DM II, HTN, anxiety, mytonia congenita.   No anesthesia complications noted with left L2-5 microdecompression 09/13/2015 at [facility] with general anesthesia.  Per Dr. 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  He was not diagnosed until 2000, served in the [employer] 3 years, c/o life long severe muscle cramps and quicker muscle fatigue as he has gotten older.  On clinical grounds is risk of general anesthesia does not appear to be greater than that of general population."  Recommended non-triggering anesthetic and avoiding hypothermia.  Charcoal filters and non triggering anesthetics were used for surgery and the patient did well.  Patient has subsequently undergone right TKA 10/12/2018 and cataract extraction 01/31/2019 at [facility] without complication.  Evaluated and evaluated by cardiology in October 2020 for palpitations.  Echo showed normal LVEF and no mitral valve abnormalities.  Zio patch palpitations with brief episode of SVT. Started on Toprol-XL 25 mg daily.  Patient was admitted: 04/20/2020 through 04/26/2020 for respiratory failure secondary to COVID-pneumonia.  Initially required is much as 10 L supplemental O2.  He was treated with remdesivir and baricitinib with improvement.  He was discharged on steroids tapering and as needed albuterol and Robitussin.  Preop labs reviewed, mild hyponatremia with sodium 130 (review of labs available in epic indicate chronic mild hyponatremia), labs otherwise unremarkable.  DM2 well-controlled, A1c 6.5.  EKG 04/20/2020: Sinus rhythm.  Inferior infarct, old.  Rate 96.  TTE 01/13/2019:  1. Left ventricular ejection fraction, by visual estimation, is 60 to  65%. The left ventricle has normal function. There is no left ventricular  hypertrophy.   2. Left ventricular diastolic parameters are consistent with Grade I   diastolic dysfunction (impaired relaxation).   3. Global right ventricle has normal systolic function.The right  ventricular size is normal. No increase in right ventricular wall  thickness.   4. Left atrial size was normal.   5. Right atrial size was normal.   6. Mild aortic valve annular calcification.   7. The mitral valve is normal in structure. Trace mitral valve  regurgitation. No evidence of mitral stenosis.   8. The tricuspid valve is normal in structure. Tricuspid valve  regurgitation is not demonstrated.   9. The aortic valve is tricuspid. Aortic valve regurgitation is not  visualized. No evidence of aortic valve sclerosis or stenosis.  10. Mild pulmonic stenosis.  11. The pulmonic valve was not well visualized. Pulmonic valve  regurgitation is not visualized.  12. Normal pulmonary artery systolic pressure.  13. The inferior vena cava is normal in size with greater than 50%  respiratory variability, suggesting right atrial pressure of 3 mmHg.   In comparison to the previous echocardiogram(s): Echocardiogram done  09/04/15 showeed an EF of 60%.   Event monitor 12/27/2018: Zio patch reviewed, 5 days 8 hours monitored.  Predominant rhythm is sinus.  Heart rate ranged from 54 beats minute up to 136 bpm with average heart rate 85 bpm.  Rare PACs and PVCs noted representing less than 1% of total beats.  There were brief episodes of SVT, the longest lasting 12 beats at 190 bpm.  No sustained arrhythmias or pauses.   Wynonia Musty [facility] Short Stay Center/Anesthesiology Phone [phone] 10/24/2020 2:41 PM

## 2020-10-24 NOTE — Anesthesia Preprocedure Evaluation (Addendum)
Anesthesia Evaluation  Patient identified by MRN, date of birth, ID band Patient awake    Reviewed: Allergy & Precautions, NPO status , Patient's Chart, lab work & pertinent test results, reviewed documented beta blocker date and time   History of Anesthesia Complications Negative for: history of anesthetic complications  Airway Mallampati: II  TM Distance: >3 FB Neck ROM: Full    Dental  (+) Dental Advisory Given, Edentulous Upper, Poor Dentition, Missing, Loose   Pulmonary COPD,  COPD inhaler, former smoker,  10/29/2020 SARS coronavirus NEG   breath sounds clear to auscultation       Cardiovascular hypertension, Pt. on medications and Pt. on home beta blockers (-) angina Rhythm:Regular Rate:Normal  '20 ECHO: EF 60-65%. The left ventricle has normal function, no LVH, Grade I DD, no significant valvular abnormalities   Neuro/Psych Anxiety negative neurological ROS     GI/Hepatic Neg liver ROS, GERD  Controlled,  Endo/Other  diabetes (glu 125), Oral Hypoglycemic AgentsMorbid obesity  Renal/GU negative Renal ROS     Musculoskeletal Myotonia congenita   Abdominal (+) + obese,   Peds  Hematology negative hematology ROS (+)   Anesthesia Other Findings   Reproductive/Obstetrics                           Anesthesia Physical Anesthesia Plan  ASA: 3  Anesthesia Plan: General   Post-op Pain Management:    Induction: Intravenous  PONV Risk Score and Plan: 2 and Ondansetron and Dexamethasone  Airway Management Planned: Oral ETT  Additional Equipment: None  Intra-op Plan:   Post-operative Plan: Extubation in OR  Informed Consent: I have reviewed the patients History and Physical, chart, labs and discussed the procedure including the risks, benefits and alternatives for the proposed anesthesia with the patient or authorized representative who has indicated his/her understanding and acceptance.      Dental advisory given  Plan Discussed with: CRNA and Surgeon  Anesthesia Plan Comments: (see below: [facility] recommends non-triggering anesthetic. Pt has had GETA with volatile agents without sequelae, however, given the variable expression of MH, will follow [facility] rec of non-triggering GA Plan routine monitors, GETA with TIVA   PAT note by Karoline Caldwell, PA-C: Former smoker (15 pack years, quit 03/10/18) with h/o DM II, HTN, anxiety, mytonia congenita.  No anesthesia complications noted with left L2-5 microdecompression 09/13/2015 at [facility] with general anesthesia. Per Dr. 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 He was not diagnosed until 2000, served in the [employer] 3 years, c/o life long severe muscle cramps and quicker muscle fatigue as he has gotten older. On clinical grounds is risk of general anesthesia does not appear to be greater than that of general population." Recommendednon-triggering anesthetic and avoiding hypothermia.  Charcoal filters and non triggering anesthetics were used for surgery and the patient did well.  Patient has subsequently undergone right TKA 10/12/2018 and cataract extraction 01/31/2019 at [facility] without complication.  Evaluated and evaluated by cardiology in October 2020 for palpitations.  Echo showed normal LVEF and no mitral valve abnormalities.  Zio patch palpitations with brief episode of SVT. Started on Toprol-XL 25 mg daily.  Patient was admitted: 04/20/2020 through 04/26/2020 for respiratory failure secondary to COVID-pneumonia.  Initially required is much as 10 L supplemental O2.  He was treated with remdesivir and baricitinib with improvement.  He was discharged on steroids tapering and as needed albuterol and Robitussin.  Preop labs  reviewed, mild hyponatremia with sodium 130 (review of labs available in epic indicate chronic mild hyponatremia), labs otherwise unremarkable.  DM2  well-controlled, A1c 6.5.  EKG 04/20/2020: Sinus rhythm.  Inferior infarct, old.  Rate 96.  TTE 01/13/2019: 1. Left ventricular ejection fraction, by visual estimation, is 60 to  65%. The left ventricle has normal function. There is no left ventricular  hypertrophy.  2. Left ventricular diastolic parameters are consistent with Grade I  diastolic dysfunction (impaired relaxation).  3. Global right ventricle has normal systolic function.The right  ventricular size is normal. No increase in right ventricular wall  thickness.  4. Left atrial size was normal.  5. Right atrial size was normal.  6. Mild aortic valve annular calcification.  7. The mitral valve is normal in structure. Trace mitral valve  regurgitation. No evidence of mitral stenosis.  8. The tricuspid valve is normal in structure. Tricuspid valve  regurgitation is not demonstrated.  9. The aortic valve is tricuspid. Aortic valve regurgitation is not  visualized. No evidence of aortic valve sclerosis or stenosis.  10. Mild pulmonic stenosis.  11. The pulmonic valve was not well visualized. Pulmonic valve  regurgitation is not visualized.  12. Normal pulmonary artery systolic pressure.  13. The inferior vena cava is normal in size with greater than 50%  respiratory variability, suggesting right atrial pressure of 3 mmHg.   In comparison to the previous echocardiogram(s): Echocardiogram done  09/04/15 showeed an EF of 60%.   Event monitor 12/27/2018: Zio patch reviewed, 5 days 8 hours monitored. Predominant rhythm is sinus. Heart rate ranged from 54 beats minute up to 136 bpm with average heart rate 85 bpm. Rare PACs and PVCs noted representing less than 1% of total beats. There were brief episodes of SVT, the longest lasting 12 beats at 190 bpm. No sustained arrhythmias or pauses.  )     Anesthesia Quick Evaluation

## 2020-10-29 ENCOUNTER — Other Ambulatory Visit: Payer: Self-pay | Admitting: Neurosurgery

## 2020-10-29 LAB — SARS CORONAVIRUS 2 (TAT 6-24 HRS): SARS Coronavirus 2: NEGATIVE

## 2020-10-31 ENCOUNTER — Encounter (HOSPITAL_COMMUNITY)
Admission: RE | Disposition: A | Discharge status: Home / Self Care | Payer: Self-pay | Source: Home / Self Care | Attending: Neurosurgery

## 2020-10-31 ENCOUNTER — Inpatient Hospital Stay (HOSPITAL_COMMUNITY): Payer: No Typology Code available for payment source

## 2020-10-31 ENCOUNTER — Encounter (HOSPITAL_COMMUNITY): Payer: Self-pay | Admitting: Neurosurgery

## 2020-10-31 ENCOUNTER — Inpatient Hospital Stay (HOSPITAL_COMMUNITY): Payer: No Typology Code available for payment source | Admitting: Physician Assistant

## 2020-10-31 ENCOUNTER — Inpatient Hospital Stay (HOSPITAL_COMMUNITY): Payer: No Typology Code available for payment source | Admitting: Anesthesiology

## 2020-10-31 ENCOUNTER — Inpatient Hospital Stay (HOSPITAL_COMMUNITY)
Admission: RE | Admit: 2020-10-31 | Discharge: 2020-11-03 | DRG: 455 | Disposition: A | Discharge status: Home / Self Care | Payer: No Typology Code available for payment source | Attending: Neurosurgery | Admitting: Neurosurgery

## 2020-10-31 ENCOUNTER — Other Ambulatory Visit: Payer: Self-pay

## 2020-10-31 DIAGNOSIS — Z7982 Long term (current) use of aspirin: Secondary | ICD-10-CM | POA: Diagnosis not present

## 2020-10-31 DIAGNOSIS — G7112 Myotonia congenita: Secondary | ICD-10-CM | POA: Diagnosis present

## 2020-10-31 DIAGNOSIS — Z961 Presence of intraocular lens: Secondary | ICD-10-CM | POA: Diagnosis present

## 2020-10-31 DIAGNOSIS — E7800 Pure hypercholesterolemia, unspecified: Secondary | ICD-10-CM | POA: Diagnosis present

## 2020-10-31 DIAGNOSIS — Z981 Arthrodesis status: Secondary | ICD-10-CM | POA: Diagnosis not present

## 2020-10-31 DIAGNOSIS — M532X6 Spinal instabilities, lumbar region: Secondary | ICD-10-CM | POA: Diagnosis present

## 2020-10-31 DIAGNOSIS — M5134 Other intervertebral disc degeneration, thoracic region: Secondary | ICD-10-CM | POA: Diagnosis present

## 2020-10-31 DIAGNOSIS — Z9842 Cataract extraction status, left eye: Secondary | ICD-10-CM

## 2020-10-31 DIAGNOSIS — Z419 Encounter for procedure for purposes other than remedying health state, unspecified: Secondary | ICD-10-CM

## 2020-10-31 DIAGNOSIS — F419 Anxiety disorder, unspecified: Secondary | ICD-10-CM | POA: Diagnosis present

## 2020-10-31 DIAGNOSIS — R262 Difficulty in walking, not elsewhere classified: Secondary | ICD-10-CM | POA: Diagnosis present

## 2020-10-31 DIAGNOSIS — Z87891 Personal history of nicotine dependence: Secondary | ICD-10-CM

## 2020-10-31 DIAGNOSIS — M5126 Other intervertebral disc displacement, lumbar region: Secondary | ICD-10-CM | POA: Diagnosis present

## 2020-10-31 DIAGNOSIS — M549 Dorsalgia, unspecified: Secondary | ICD-10-CM | POA: Diagnosis present

## 2020-10-31 DIAGNOSIS — K219 Gastro-esophageal reflux disease without esophagitis: Secondary | ICD-10-CM | POA: Diagnosis present

## 2020-10-31 DIAGNOSIS — Z85828 Personal history of other malignant neoplasm of skin: Secondary | ICD-10-CM | POA: Diagnosis not present

## 2020-10-31 DIAGNOSIS — M48061 Spinal stenosis, lumbar region without neurogenic claudication: Secondary | ICD-10-CM | POA: Diagnosis present

## 2020-10-31 DIAGNOSIS — E1151 Type 2 diabetes mellitus with diabetic peripheral angiopathy without gangrene: Secondary | ICD-10-CM | POA: Diagnosis present

## 2020-10-31 DIAGNOSIS — Z96651 Presence of right artificial knee joint: Secondary | ICD-10-CM | POA: Diagnosis present

## 2020-10-31 DIAGNOSIS — Z79899 Other long term (current) drug therapy: Secondary | ICD-10-CM | POA: Diagnosis not present

## 2020-10-31 DIAGNOSIS — I10 Essential (primary) hypertension: Secondary | ICD-10-CM | POA: Diagnosis present

## 2020-10-31 DIAGNOSIS — Z7984 Long term (current) use of oral hypoglycemic drugs: Secondary | ICD-10-CM | POA: Diagnosis not present

## 2020-10-31 LAB — GLUCOSE, CAPILLARY
Glucose-Capillary: 146 mg/dL — ABNORMAL HIGH (ref 70–99)
Glucose-Capillary: 125 mg/dL — ABNORMAL HIGH (ref 70–99)
Glucose-Capillary: 191 mg/dL — ABNORMAL HIGH (ref 70–99)

## 2020-10-31 IMAGING — RF DG C-ARM 1-60 MIN
1 series · 2 of 2 positions shown · non-contrast
Comparison: CT myelogram [DATE]

CLINICAL DATA: Posterior lumbar interbody fusion L1-L2 with
cortical screw fusion. Thoracic 10 to L3 cortical screws fixation in
connectors.

EXAM:
THORACOLUMBAR SPINE 1V

[Series 1: run · 2 of 2 slices shown]
[im 1/2]
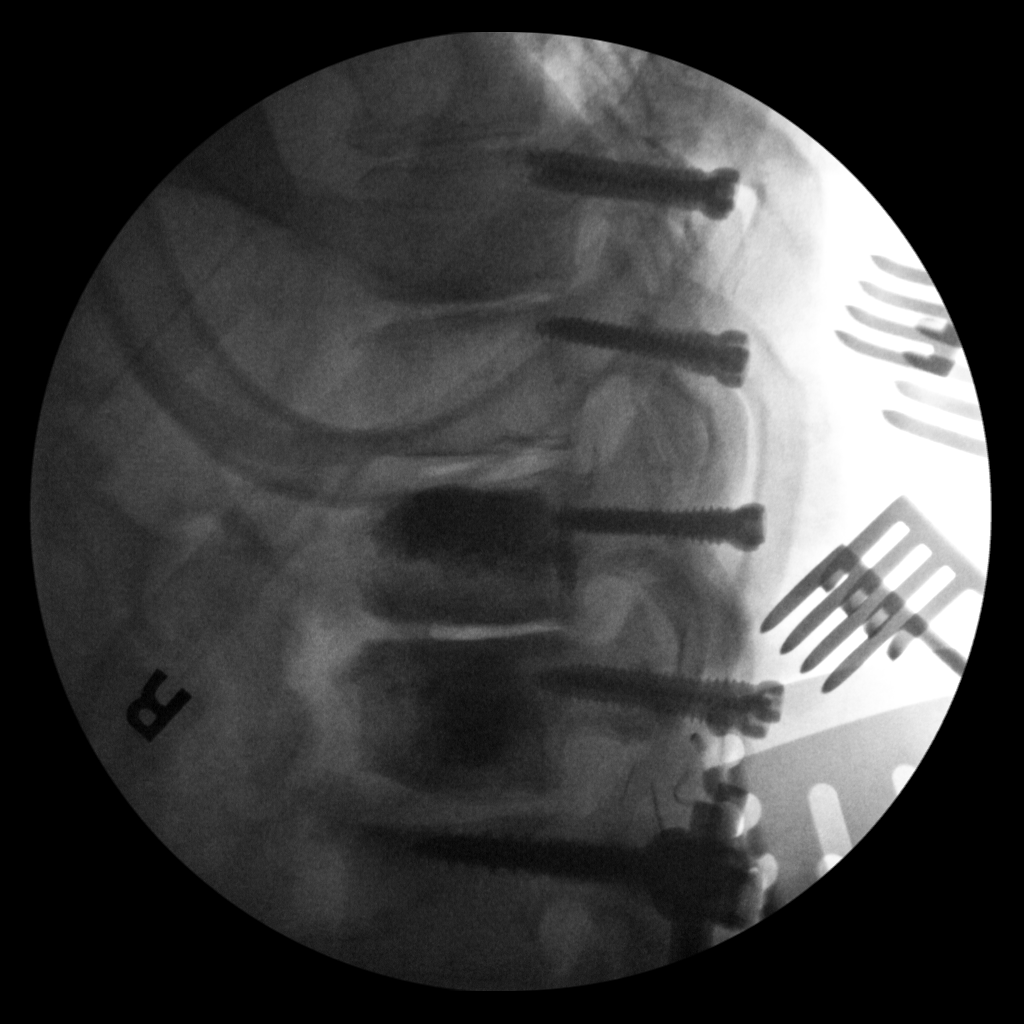
[im 2/2]
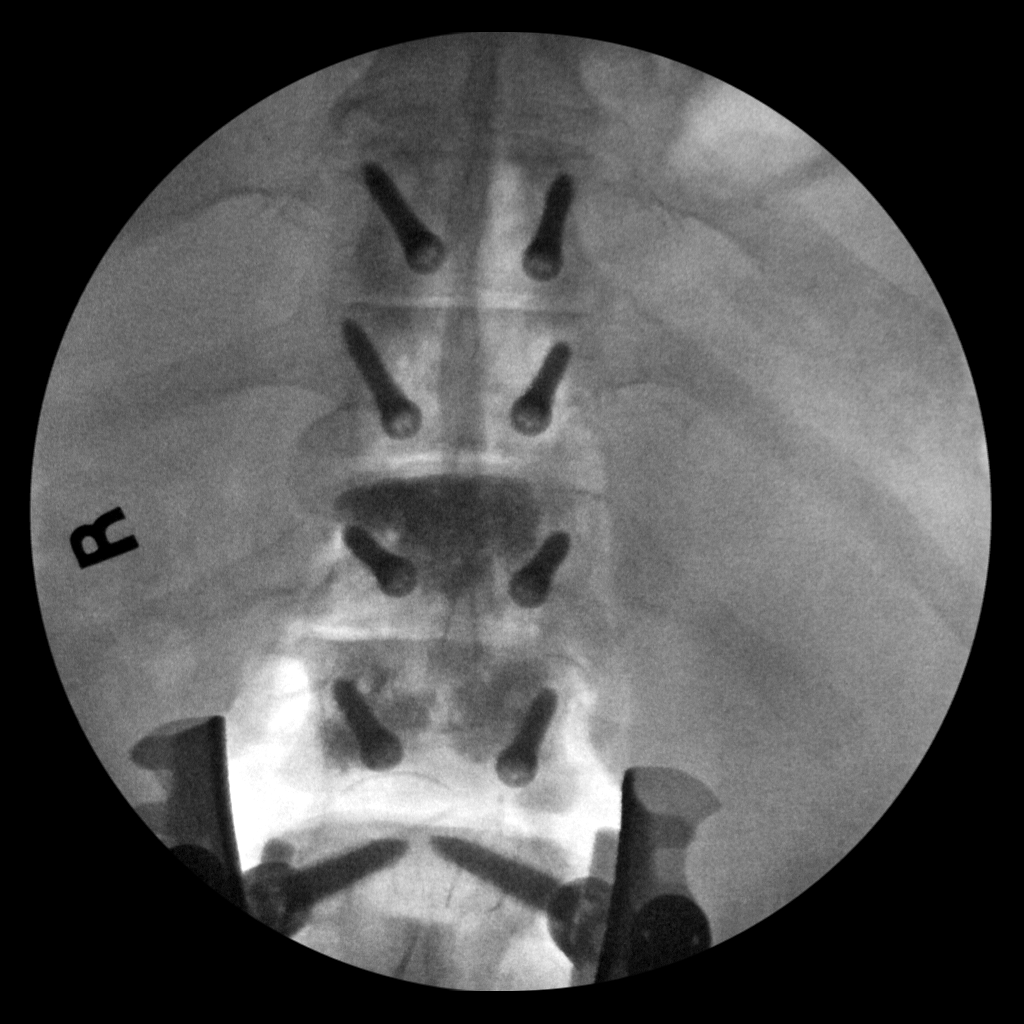

[2 of 2 positions shown; findings below may reference images not displayed]

FINDINGS: Two fluoroscopic spot views of the thoracolumbar spine obtained in
the operating room. Augmented wall correspond to T12 and L1 on prior
CT. There are new pedicle screws at T10 and T11. Previous lumbar
hardware is partially included. Fluoroscopy time 1 minutes 20
seconds. Dose 52.14 mGy.
IMPRESSION: Fluoroscopic spot views of thoracolumbar spine with pedicle screw
placement at T10 and T11.

## 2020-10-31 IMAGING — RF DG THORACOLUMBAR SPINE 2V
1 series · 2 of 2 positions shown · non-contrast
Comparison: CT myelogram [DATE]

CLINICAL DATA: Posterior lumbar interbody fusion L1-L2 with
cortical screw fusion. Thoracic 10 to L3 cortical screws fixation in
connectors.

EXAM:
THORACOLUMBAR SPINE 1V

[Series 1: run · 2 of 2 slices shown]
[im 1/2]
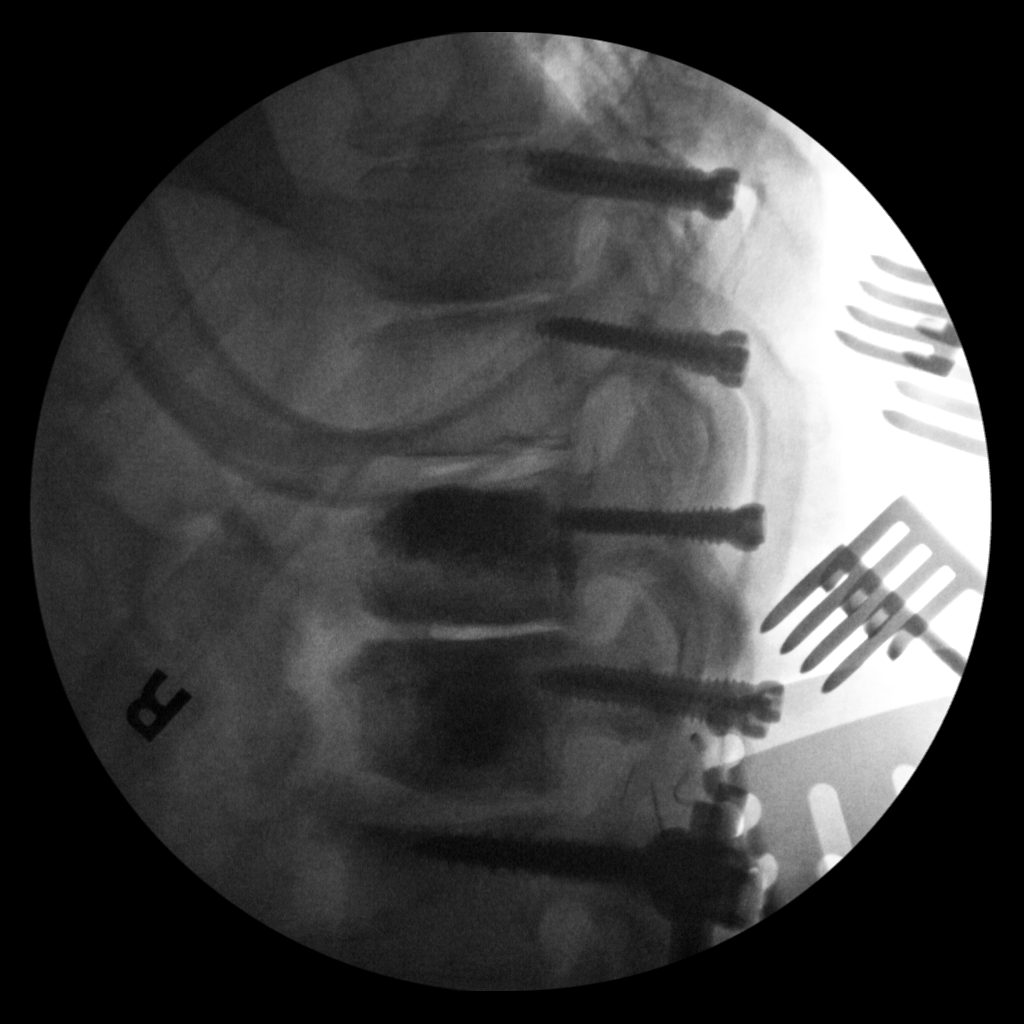
[im 2/2]
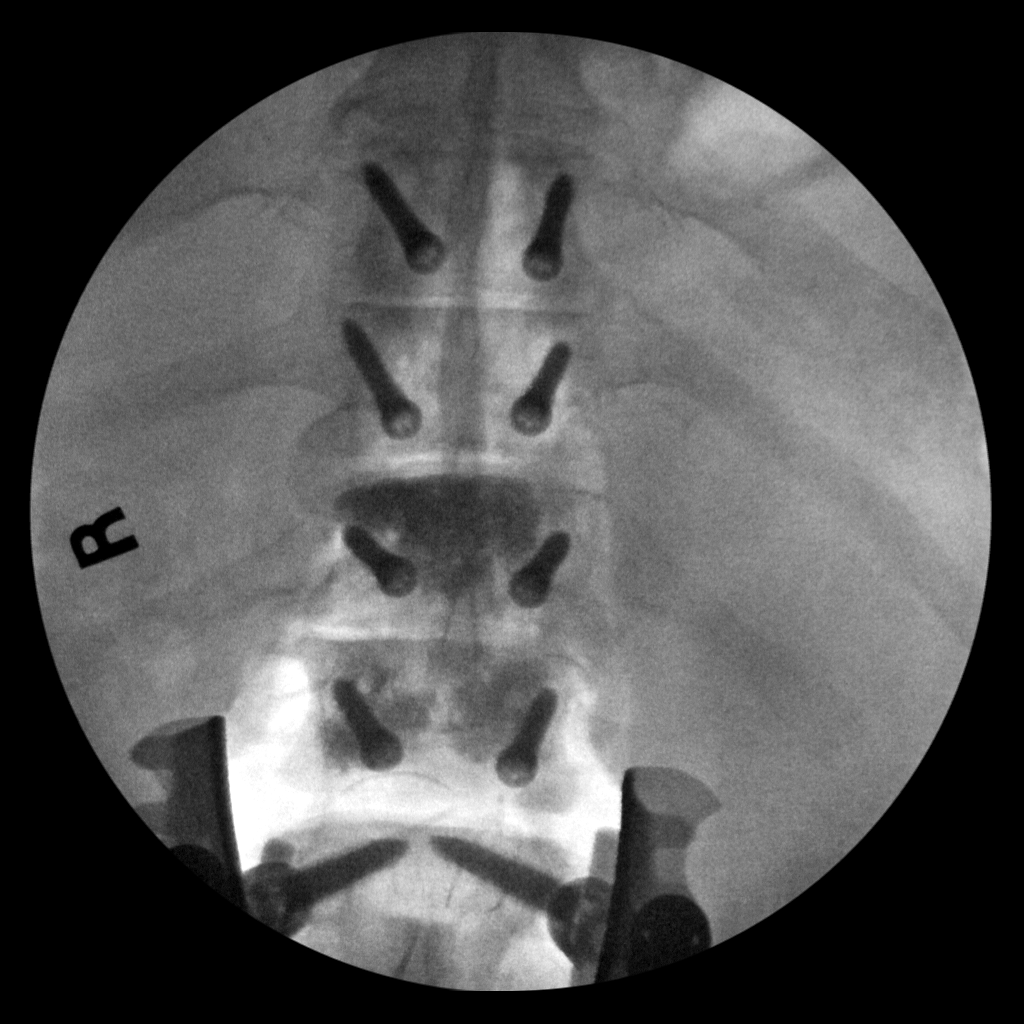

[2 of 2 positions shown; findings below may reference images not displayed]

FINDINGS: Two fluoroscopic spot views of the thoracolumbar spine obtained in
the operating room. Augmented wall correspond to T12 and L1 on prior
CT. There are new pedicle screws at T10 and T11. Previous lumbar
hardware is partially included. Fluoroscopy time 1 minutes 20
seconds. Dose 52.14 mGy.
IMPRESSION: Fluoroscopic spot views of thoracolumbar spine with pedicle screw
placement at T10 and T11.

## 2020-10-31 SURGERY — POSTERIOR LUMBAR FUSION 2 LEVEL
Anesthesia: General | Site: Back

## 2020-10-31 MED ORDER — FENTANYL CITRATE (PF) 250 MCG/5ML IJ SOLN
INTRAMUSCULAR | Status: DC | PRN
  Administered 2020-10-31: 100 ug via INTRAVENOUS
  Administered 2020-10-31: 150 ug via INTRAVENOUS

## 2020-10-31 MED ORDER — SODIUM CHLORIDE 0.9 % IV SOLN: 250.0000 mL | INTRAVENOUS | Status: DC

## 2020-10-31 MED ORDER — MIDAZOLAM HCL 2 MG/2ML IJ SOLN
INTRAMUSCULAR | Status: AC
  Filled 2020-10-31: qty 2

## 2020-10-31 MED ORDER — METFORMIN HCL 500 MG PO TABS
500.0000 mg | ORAL_TABLET | Freq: Two times a day (BID) | ORAL | Status: DC
  Administered 2020-11-01 – 2020-11-03 (×5): 500 mg via ORAL
  Filled 2020-10-31 (×5): qty 1

## 2020-10-31 MED ORDER — LIDOCAINE-EPINEPHRINE 1 %-1:100000 IJ SOLN
INTRAMUSCULAR | Status: AC
  Filled 2020-10-31: qty 1

## 2020-10-31 MED ORDER — BUPIVACAINE HCL (PF) 0.25 % IJ SOLN
INTRAMUSCULAR | Status: AC
  Filled 2020-10-31: qty 30

## 2020-10-31 MED ORDER — ATORVASTATIN CALCIUM 40 MG PO TABS
40.0000 mg | ORAL_TABLET | Freq: Every day | ORAL | Status: DC
  Administered 2020-11-01 – 2020-11-03 (×3): 40 mg via ORAL
  Filled 2020-10-31 (×3): qty 1

## 2020-10-31 MED ORDER — SODIUM CHLORIDE 0.9% FLUSH
3.0000 mL | Freq: Two times a day (BID) | INTRAVENOUS | Status: DC
  Administered 2020-11-01 – 2020-11-02 (×4): 3 mL via INTRAVENOUS

## 2020-10-31 MED ORDER — PHENYLEPHRINE HCL-NACL 20-0.9 MG/250ML-% IV SOLN
INTRAVENOUS | Status: AC
  Filled 2020-10-31: qty 250

## 2020-10-31 MED ORDER — HYDROXYZINE HCL 25 MG PO TABS: 25.0000 mg | ORAL_TABLET | Freq: Three times a day (TID) | ORAL | Status: DC | PRN

## 2020-10-31 MED ORDER — MENTHOL 3 MG MT LOZG: 1.0000 | LOZENGE | OROMUCOSAL | Status: DC | PRN

## 2020-10-31 MED ORDER — 0.9 % SODIUM CHLORIDE (POUR BTL) OPTIME
TOPICAL | Status: DC | PRN
  Administered 2020-10-31 (×2): 1000 mL

## 2020-10-31 MED ORDER — CYCLOBENZAPRINE HCL 10 MG PO TABS
10.0000 mg | ORAL_TABLET | Freq: Three times a day (TID) | ORAL | Status: DC | PRN
  Administered 2020-10-31 – 2020-11-03 (×8): 10 mg via ORAL
  Filled 2020-10-31 (×8): qty 1

## 2020-10-31 MED ORDER — SODIUM CHLORIDE (HYPERTONIC) 5 % OP SOLN
1.0000 [drp] | OPHTHALMIC | Status: DC | PRN
  Filled 2020-10-31: qty 15

## 2020-10-31 MED ORDER — MEPERIDINE HCL 25 MG/ML IJ SOLN: 6.2500 mg | INTRAMUSCULAR | Status: DC | PRN

## 2020-10-31 MED ORDER — PHENYLEPHRINE HCL-NACL 20-0.9 MG/250ML-% IV SOLN
INTRAVENOUS | Status: DC | PRN
  Administered 2020-10-31: 25 ug/min via INTRAVENOUS

## 2020-10-31 MED ORDER — OXYCODONE HCL 5 MG PO TABS: 5.0000 mg | ORAL_TABLET | ORAL | Status: DC | PRN

## 2020-10-31 MED ORDER — ALBUTEROL SULFATE (2.5 MG/3ML) 0.083% IN NEBU: 2.5000 mg | INHALATION_SOLUTION | Freq: Four times a day (QID) | RESPIRATORY_TRACT | Status: DC | PRN

## 2020-10-31 MED ORDER — ASPIRIN 81 MG PO CHEW
81.0000 mg | CHEWABLE_TABLET | Freq: Every day | ORAL | Status: DC
  Administered 2020-11-01 – 2020-11-03 (×3): 81 mg via ORAL
  Filled 2020-10-31 (×4): qty 1

## 2020-10-31 MED ORDER — THROMBIN 20000 UNITS EX SOLR
CUTANEOUS | Status: DC | PRN
  Administered 2020-10-31: 20 mL via TOPICAL

## 2020-10-31 MED ORDER — ADULT MULTIVITAMIN W/MINERALS CH
1.0000 | ORAL_TABLET | Freq: Every day | ORAL | Status: DC
  Administered 2020-11-02 – 2020-11-03 (×2): 1 via ORAL
  Filled 2020-10-31 (×2): qty 1

## 2020-10-31 MED ORDER — ROCURONIUM BROMIDE 10 MG/ML (PF) SYRINGE
PREFILLED_SYRINGE | INTRAVENOUS | Status: AC
  Filled 2020-10-31: qty 10

## 2020-10-31 MED ORDER — CHLORHEXIDINE GLUCONATE CLOTH 2 % EX PADS: 6.0000 | MEDICATED_PAD | Freq: Once | CUTANEOUS | Status: DC

## 2020-10-31 MED ORDER — PROPOFOL 500 MG/50ML IV EMUL
INTRAVENOUS | Status: DC | PRN
  Administered 2020-10-31: 75 ug/kg/min via INTRAVENOUS

## 2020-10-31 MED ORDER — ACETAMINOPHEN 650 MG RE SUPP: 650.0000 mg | RECTAL | Status: DC | PRN

## 2020-10-31 MED ORDER — DEXAMETHASONE SODIUM PHOSPHATE 10 MG/ML IJ SOLN
INTRAMUSCULAR | Status: AC
  Filled 2020-10-31: qty 1

## 2020-10-31 MED ORDER — SODIUM CHLORIDE 0.9% FLUSH: 3.0000 mL | INTRAVENOUS | Status: DC | PRN

## 2020-10-31 MED ORDER — VITAMIN D 25 MCG (1000 UNIT) PO TABS
5000.0000 [IU] | ORAL_TABLET | Freq: Every day | ORAL | Status: DC
  Administered 2020-11-03: 5000 [IU] via ORAL
  Filled 2020-10-31: qty 5

## 2020-10-31 MED ORDER — FENTANYL CITRATE (PF) 250 MCG/5ML IJ SOLN
INTRAMUSCULAR | Status: AC
  Filled 2020-10-31: qty 5

## 2020-10-31 MED ORDER — CEFAZOLIN SODIUM-DEXTROSE 2-4 GM/100ML-% IV SOLN
INTRAVENOUS | Status: AC
  Filled 2020-10-31: qty 100

## 2020-10-31 MED ORDER — ALBUMIN HUMAN 5 % IV SOLN: INTRAVENOUS | Status: DC | PRN

## 2020-10-31 MED ORDER — CHLORHEXIDINE GLUCONATE 0.12 % MT SOLN
15.0000 mL | Freq: Once | OROMUCOSAL | Status: AC
  Administered 2020-10-31: 15 mL via OROMUCOSAL
  Filled 2020-10-31: qty 15

## 2020-10-31 MED ORDER — ROPINIROLE HCL 1 MG PO TABS
0.5000 mg | ORAL_TABLET | Freq: Every day | ORAL | Status: DC
  Administered 2020-11-01 – 2020-11-02 (×2): 0.5 mg via ORAL
  Filled 2020-10-31 (×2): qty 1

## 2020-10-31 MED ORDER — PANTOPRAZOLE SODIUM 40 MG IV SOLR
40.0000 mg | Freq: Every day | INTRAVENOUS | Status: DC
  Administered 2020-10-31: 40 mg via INTRAVENOUS
  Filled 2020-10-31: qty 40

## 2020-10-31 MED ORDER — LORAZEPAM 0.5 MG PO TABS
0.5000 mg | ORAL_TABLET | Freq: Every day | ORAL | Status: DC
  Administered 2020-10-31 – 2020-11-02 (×3): 0.5 mg via ORAL
  Filled 2020-10-31 (×3): qty 1

## 2020-10-31 MED ORDER — MIDAZOLAM HCL 5 MG/5ML IJ SOLN
INTRAMUSCULAR | Status: DC | PRN
  Administered 2020-10-31 (×3): 1 mg via INTRAVENOUS
  Administered 2020-10-31: 2 mg via INTRAVENOUS

## 2020-10-31 MED ORDER — SUFENTANIL CITRATE 250 MCG/5ML IV SOLN
0.2500 ug/kg/h | INTRAVENOUS | Status: AC
  Administered 2020-10-31: .25 ug/kg/h via INTRAVENOUS
  Filled 2020-10-31: qty 5

## 2020-10-31 MED ORDER — HYDROMORPHONE HCL 1 MG/ML IJ SOLN
0.2500 mg | INTRAMUSCULAR | Status: DC | PRN
  Administered 2020-10-31 (×3): 0.5 mg via INTRAVENOUS

## 2020-10-31 MED ORDER — LISINOPRIL 20 MG PO TABS
20.0000 mg | ORAL_TABLET | Freq: Every day | ORAL | Status: DC
  Administered 2020-11-01 – 2020-11-03 (×2): 20 mg via ORAL
  Filled 2020-10-31 (×4): qty 1

## 2020-10-31 MED ORDER — LIDOCAINE-EPINEPHRINE 1 %-1:100000 IJ SOLN
INTRAMUSCULAR | Status: DC | PRN
  Administered 2020-10-31: 10 mL

## 2020-10-31 MED ORDER — PHENOL 1.4 % MT LIQD: 1.0000 | OROMUCOSAL | Status: DC | PRN

## 2020-10-31 MED ORDER — HYDROMORPHONE HCL 1 MG/ML IJ SOLN
INTRAMUSCULAR | Status: AC
  Filled 2020-10-31: qty 1

## 2020-10-31 MED ORDER — LIDOCAINE 2% (20 MG/ML) 5 ML SYRINGE
INTRAMUSCULAR | Status: DC | PRN
  Administered 2020-10-31: 20 mg via INTRAVENOUS

## 2020-10-31 MED ORDER — THROMBIN 20000 UNITS EX SOLR
CUTANEOUS | Status: AC
  Filled 2020-10-31: qty 20000

## 2020-10-31 MED ORDER — CEFAZOLIN SODIUM-DEXTROSE 2-4 GM/100ML-% IV SOLN
2.0000 g | Freq: Three times a day (TID) | INTRAVENOUS | Status: AC
  Administered 2020-11-01 (×2): 2 g via INTRAVENOUS
  Filled 2020-10-31 (×2): qty 100

## 2020-10-31 MED ORDER — ROCURONIUM BROMIDE 10 MG/ML (PF) SYRINGE
PREFILLED_SYRINGE | INTRAVENOUS | Status: DC | PRN
  Administered 2020-10-31: 30 mg via INTRAVENOUS
  Administered 2020-10-31: 20 mg via INTRAVENOUS
  Administered 2020-10-31: 30 mg via INTRAVENOUS
  Administered 2020-10-31: 70 mg via INTRAVENOUS
  Administered 2020-10-31: 20 mg via INTRAVENOUS
  Administered 2020-10-31 (×2): 30 mg via INTRAVENOUS

## 2020-10-31 MED ORDER — ONDANSETRON HCL 4 MG PO TABS: 4.0000 mg | ORAL_TABLET | Freq: Four times a day (QID) | ORAL | Status: DC | PRN

## 2020-10-31 MED ORDER — HYDROCHLOROTHIAZIDE 25 MG PO TABS
25.0000 mg | ORAL_TABLET | Freq: Every day | ORAL | Status: DC
  Administered 2020-11-01 – 2020-11-03 (×3): 25 mg via ORAL
  Filled 2020-10-31 (×4): qty 1

## 2020-10-31 MED ORDER — BUPIVACAINE LIPOSOME 1.3 % IJ SUSP
INTRAMUSCULAR | Status: DC | PRN
  Administered 2020-10-31: 20 mL

## 2020-10-31 MED ORDER — LIDOCAINE 2% (20 MG/ML) 5 ML SYRINGE
INTRAMUSCULAR | Status: AC
  Filled 2020-10-31: qty 5

## 2020-10-31 MED ORDER — MIDAZOLAM HCL 2 MG/2ML IJ SOLN: 0.5000 mg | Freq: Once | INTRAMUSCULAR | Status: DC | PRN

## 2020-10-31 MED ORDER — CEFAZOLIN SODIUM-DEXTROSE 2-4 GM/100ML-% IV SOLN
2.0000 g | INTRAVENOUS | Status: AC
  Administered 2020-10-31 (×2): 2 g via INTRAVENOUS
  Filled 2020-10-31: qty 100

## 2020-10-31 MED ORDER — ZINC SULFATE 220 (50 ZN) MG PO CAPS
220.0000 mg | ORAL_CAPSULE | Freq: Every day | ORAL | Status: DC
  Administered 2020-11-01 – 2020-11-03 (×3): 220 mg via ORAL
  Filled 2020-10-31 (×3): qty 1

## 2020-10-31 MED ORDER — ORAL CARE MOUTH RINSE: 15.0000 mL | Freq: Once | OROMUCOSAL | Status: AC

## 2020-10-31 MED ORDER — PROMETHAZINE HCL 25 MG/ML IJ SOLN: 6.2500 mg | INTRAMUSCULAR | Status: DC | PRN

## 2020-10-31 MED ORDER — OXYCODONE HCL 5 MG/5ML PO SOLN: 5.0000 mg | Freq: Once | ORAL | Status: DC | PRN

## 2020-10-31 MED ORDER — OXYCODONE HCL 5 MG PO TABS
5.0000 mg | ORAL_TABLET | ORAL | Status: DC | PRN
  Administered 2020-10-31 – 2020-11-03 (×13): 10 mg via ORAL
  Filled 2020-10-31 (×13): qty 2

## 2020-10-31 MED ORDER — PROPOFOL 1000 MG/100ML IV EMUL
INTRAVENOUS | Status: AC
  Filled 2020-10-31: qty 100

## 2020-10-31 MED ORDER — HYDROMORPHONE HCL 1 MG/ML IJ SOLN
0.5000 mg | INTRAMUSCULAR | Status: DC | PRN
  Administered 2020-10-31 – 2020-11-01 (×4): 0.5 mg via INTRAVENOUS
  Filled 2020-10-31 (×4): qty 0.5

## 2020-10-31 MED ORDER — LISINOPRIL-HYDROCHLOROTHIAZIDE 20-25 MG PO TABS: 1.0000 | ORAL_TABLET | Freq: Every day | ORAL | Status: DC

## 2020-10-31 MED ORDER — INSULIN ASPART 100 UNIT/ML IJ SOLN
0.0000 [IU] | Freq: Three times a day (TID) | INTRAMUSCULAR | Status: DC
  Administered 2020-11-01 (×2): 2 [IU] via SUBCUTANEOUS
  Administered 2020-11-01: 3 [IU] via SUBCUTANEOUS
  Administered 2020-11-02: 2 [IU] via SUBCUTANEOUS
  Administered 2020-11-02: 3 [IU] via SUBCUTANEOUS
  Administered 2020-11-02: 2 [IU] via SUBCUTANEOUS
  Administered 2020-11-03: 3 [IU] via SUBCUTANEOUS

## 2020-10-31 MED ORDER — ACETAMINOPHEN 500 MG PO TABS: 1000.0000 mg | ORAL_TABLET | Freq: Once | ORAL | Status: DC

## 2020-10-31 MED ORDER — BUPIVACAINE LIPOSOME 1.3 % IJ SUSP
INTRAMUSCULAR | Status: AC
  Filled 2020-10-31: qty 20

## 2020-10-31 MED ORDER — PHENYLEPHRINE 40 MCG/ML (10ML) SYRINGE FOR IV PUSH (FOR BLOOD PRESSURE SUPPORT)
PREFILLED_SYRINGE | INTRAVENOUS | Status: AC
  Filled 2020-10-31: qty 10

## 2020-10-31 MED ORDER — PHENYTOIN SODIUM EXTENDED 100 MG PO CAPS
100.0000 mg | ORAL_CAPSULE | Freq: Three times a day (TID) | ORAL | Status: DC
  Administered 2020-10-31 – 2020-11-03 (×8): 100 mg via ORAL
  Filled 2020-10-31 (×10): qty 1

## 2020-10-31 MED ORDER — LACTATED RINGERS IV SOLN: INTRAVENOUS | Status: DC | PRN

## 2020-10-31 MED ORDER — PAROXETINE HCL 20 MG PO TABS
20.0000 mg | ORAL_TABLET | Freq: Every day | ORAL | Status: DC
  Administered 2020-10-31 – 2020-11-02 (×3): 20 mg via ORAL
  Filled 2020-10-31 (×4): qty 1

## 2020-10-31 MED ORDER — PROPOFOL 10 MG/ML IV BOLUS
INTRAVENOUS | Status: DC | PRN
  Administered 2020-10-31: 20 mg via INTRAVENOUS
  Administered 2020-10-31: 150 mg via INTRAVENOUS
  Administered 2020-10-31: 50 mg via INTRAVENOUS

## 2020-10-31 MED ORDER — PANTOPRAZOLE SODIUM 40 MG PO TBEC: 40.0000 mg | DELAYED_RELEASE_TABLET | Freq: Every day | ORAL | Status: DC

## 2020-10-31 MED ORDER — ALUM & MAG HYDROXIDE-SIMETH 200-200-20 MG/5ML PO SUSP: 30.0000 mL | Freq: Four times a day (QID) | ORAL | Status: DC | PRN

## 2020-10-31 MED ORDER — SUGAMMADEX SODIUM 200 MG/2ML IV SOLN
INTRAVENOUS | Status: DC | PRN
  Administered 2020-10-31: 300 mg via INTRAVENOUS

## 2020-10-31 MED ORDER — ACETAMINOPHEN 325 MG PO TABS
650.0000 mg | ORAL_TABLET | ORAL | Status: DC | PRN
  Administered 2020-10-31 – 2020-11-02 (×4): 650 mg via ORAL
  Filled 2020-10-31 (×4): qty 2

## 2020-10-31 MED ORDER — CARBIDOPA-LEVODOPA ER 48.75-195 MG PO CPCR: 1.0000 | ORAL_CAPSULE | Freq: Four times a day (QID) | ORAL | Status: DC

## 2020-10-31 MED ORDER — METOPROLOL SUCCINATE ER 25 MG PO TB24
25.0000 mg | ORAL_TABLET | Freq: Every day | ORAL | Status: DC
  Administered 2020-11-01 – 2020-11-03 (×3): 25 mg via ORAL
  Filled 2020-10-31 (×3): qty 1

## 2020-10-31 MED ORDER — ONDANSETRON HCL 4 MG/2ML IJ SOLN
INTRAMUSCULAR | Status: AC
  Filled 2020-10-31: qty 2

## 2020-10-31 MED ORDER — OXYCODONE HCL 5 MG PO TABS
5.0000 mg | ORAL_TABLET | Freq: Once | ORAL | Status: DC | PRN
Start: 2020-10-31 — End: 2020-10-31

## 2020-10-31 MED ORDER — ONDANSETRON HCL 4 MG/2ML IJ SOLN: 4.0000 mg | Freq: Four times a day (QID) | INTRAMUSCULAR | Status: DC | PRN

## 2020-10-31 MED ORDER — DEXAMETHASONE SODIUM PHOSPHATE 10 MG/ML IJ SOLN
10.0000 mg | Freq: Once | INTRAMUSCULAR | Status: AC
  Administered 2020-10-31: 10 mg via INTRAVENOUS

## 2020-10-31 MED ORDER — ROPINIROLE HCL 1 MG PO TABS
1.0000 mg | ORAL_TABLET | Freq: Every day | ORAL | Status: DC
  Administered 2020-10-31 – 2020-11-02 (×3): 1 mg via ORAL
  Filled 2020-10-31 (×3): qty 1

## 2020-10-31 MED ORDER — PAROXETINE HCL 20 MG PO TABS
40.0000 mg | ORAL_TABLET | Freq: Every day | ORAL | Status: DC
  Administered 2020-11-01 – 2020-11-03 (×3): 40 mg via ORAL
  Filled 2020-10-31 (×3): qty 2

## 2020-10-31 MED ORDER — LACTATED RINGERS IV SOLN: INTRAVENOUS | Status: DC

## 2020-10-31 SURGICAL SUPPLY — 76 items
BAG COUNTER SPONGE SURGICOUNT (BAG) ×4 IMPLANT
BASKET BONE COLLECTION (BASKET) ×2 IMPLANT
BENZOIN TINCTURE PRP APPL 2/3 (GAUZE/BANDAGES/DRESSINGS) ×2 IMPLANT
BLADE CLIPPER SURG (BLADE) IMPLANT
BLADE SURG 11 STRL SS (BLADE) ×2 IMPLANT
BONE VIVIGEN FORMABLE 10CC (Bone Implant) ×2 IMPLANT
BUR CUTTER 7.0 ROUND (BURR) ×2 IMPLANT
BUR MATCHSTICK NEURO 3.0 LAGG (BURR) ×2 IMPLANT
CANISTER SUCT 3000ML PPV (MISCELLANEOUS) ×2 IMPLANT
CAP LOCKING THREADED (Cap) ×16 IMPLANT
CARTRIDGE OIL MAESTRO DRILL (MISCELLANEOUS) ×1 IMPLANT
CLAMP REVERE ADDITION 5.5-5.5 (Clamp) ×4 IMPLANT
CNTNR URN SCR LID CUP LEK RST (MISCELLANEOUS) ×1 IMPLANT
CONT SPEC 4OZ STRL OR WHT (MISCELLANEOUS) ×1
COVER BACK TABLE 60X90IN (DRAPES) ×2 IMPLANT
DECANTER SPIKE VIAL GLASS SM (MISCELLANEOUS) IMPLANT
DERMABOND ADVANCED (GAUZE/BANDAGES/DRESSINGS) ×1
DERMABOND ADVANCED .7 DNX12 (GAUZE/BANDAGES/DRESSINGS) ×1 IMPLANT
DIFFUSER DRILL AIR PNEUMATIC (MISCELLANEOUS) ×2 IMPLANT
DRAPE C-ARM 42X72 X-RAY (DRAPES) ×2 IMPLANT
DRAPE C-ARMOR (DRAPES) ×2 IMPLANT
DRAPE HALF SHEET 40X57 (DRAPES) IMPLANT
DRAPE LAPAROTOMY 100X72X124 (DRAPES) ×2 IMPLANT
DRAPE SURG 17X23 STRL (DRAPES) ×2 IMPLANT
DRSG OPSITE 4X5.5 SM (GAUZE/BANDAGES/DRESSINGS) ×2 IMPLANT
DRSG OPSITE POSTOP 4X10 (GAUZE/BANDAGES/DRESSINGS) ×2 IMPLANT
DRSG OPSITE POSTOP 4X6 (GAUZE/BANDAGES/DRESSINGS) IMPLANT
DURAPREP 26ML APPLICATOR (WOUND CARE) ×2 IMPLANT
ELECT BLADE 4.0 EZ CLEAN MEGAD (MISCELLANEOUS) ×2
ELECT REM PT RETURN 9FT ADLT (ELECTROSURGICAL) ×2
ELECTRODE BLDE 4.0 EZ CLN MEGD (MISCELLANEOUS) ×1 IMPLANT
ELECTRODE REM PT RTRN 9FT ADLT (ELECTROSURGICAL) ×1 IMPLANT
EVACUATOR 3/16  PVC DRAIN (DRAIN) ×1
EVACUATOR 3/16 PVC DRAIN (DRAIN) ×1 IMPLANT
GAUZE 4X4 16PLY ~~LOC~~+RFID DBL (SPONGE) ×2 IMPLANT
GAUZE SPONGE 4X4 12PLY STRL (GAUZE/BANDAGES/DRESSINGS) ×2 IMPLANT
GLOVE EXAM NITRILE XL STR (GLOVE) IMPLANT
GLOVE SURG ENC MOIS LTX SZ7 (GLOVE) ×2 IMPLANT
GLOVE SURG ENC MOIS LTX SZ8 (GLOVE) ×4 IMPLANT
GLOVE SURG UNDER LTX SZ8.5 (GLOVE) ×4 IMPLANT
GLOVE SURG UNDER POLY LF SZ7 (GLOVE) ×6 IMPLANT
GLOVE SURG UNDER POLY LF SZ7.5 (GLOVE) ×2 IMPLANT
GOWN STRL REUS W/ TWL LRG LVL3 (GOWN DISPOSABLE) ×2 IMPLANT
GOWN STRL REUS W/ TWL XL LVL3 (GOWN DISPOSABLE) ×2 IMPLANT
GOWN STRL REUS W/TWL 2XL LVL3 (GOWN DISPOSABLE) IMPLANT
GOWN STRL REUS W/TWL LRG LVL3 (GOWN DISPOSABLE) ×2
GOWN STRL REUS W/TWL XL LVL3 (GOWN DISPOSABLE) ×2
GRAFT BONE PROTEIOS LRG 5CC (Orthopedic Implant) ×2 IMPLANT
IMPL SPINE MCS 6.5 5.5X35 (Neuro Prosthesis/Implant) ×2 IMPLANT
IMPLANT SPINE MCS 6.5 5.5X35 (Neuro Prosthesis/Implant) ×4 IMPLANT
KIT BASIN OR (CUSTOM PROCEDURE TRAY) ×2 IMPLANT
KIT POSITION SURG JACKSON T1 (MISCELLANEOUS) IMPLANT
KIT TURNOVER KIT B (KITS) ×2 IMPLANT
MILL MEDIUM DISP (BLADE) ×2 IMPLANT
NEEDLE HYPO 21X1.5 SAFETY (NEEDLE) ×2 IMPLANT
NEEDLE HYPO 25X1 1.5 SAFETY (NEEDLE) ×2 IMPLANT
NS IRRIG 1000ML POUR BTL (IV SOLUTION) ×4 IMPLANT
OIL CARTRIDGE MAESTRO DRILL (MISCELLANEOUS) ×2
PACK LAMINECTOMY NEURO (CUSTOM PROCEDURE TRAY) ×2 IMPLANT
PAD ARMBOARD 7.5X6 YLW CONV (MISCELLANEOUS) ×4 IMPLANT
PATTIES SURGICAL .75X.75 (GAUZE/BANDAGES/DRESSINGS) ×2 IMPLANT
ROD SPINE CVD CREO 5.5X150 (Rod) ×4 IMPLANT
SCREW 6.5X5.5 30MM (Screw) ×12 IMPLANT
SCREW PA THRD CREO TULIP 5.5X4 (Head) ×16 IMPLANT
SPONGE SURGIFOAM ABS GEL 100 (HEMOSTASIS) ×2 IMPLANT
SPONGE T-LAP 4X18 ~~LOC~~+RFID (SPONGE) ×4 IMPLANT
STRIP CLOSURE SKIN 1/2X4 (GAUZE/BANDAGES/DRESSINGS) ×2 IMPLANT
SUT VIC AB 0 CT1 18XCR BRD8 (SUTURE) ×1 IMPLANT
SUT VIC AB 0 CT1 8-18 (SUTURE) ×1
SUT VIC AB 2-0 CT1 18 (SUTURE) ×2 IMPLANT
SUT VIC AB 4-0 PS2 27 (SUTURE) ×2 IMPLANT
SYR 20ML LL LF (SYRINGE) ×2 IMPLANT
TOWEL GREEN STERILE (TOWEL DISPOSABLE) ×2 IMPLANT
TOWEL GREEN STERILE FF (TOWEL DISPOSABLE) ×2 IMPLANT
TRAY FOLEY MTR SLVR 16FR STAT (SET/KITS/TRAYS/PACK) ×2 IMPLANT
WATER STERILE IRR 1000ML POUR (IV SOLUTION) ×2 IMPLANT

## 2020-10-31 NOTE — Anesthesia Postprocedure Evaluation (Signed)
Anesthesia Post Note  Patient: Danny Hampton  Procedure(s) Performed: Posterior Lumbar Fusion  - Lumbar one-Lumbar two with cortical screw fusion Thoracic ten to Lumbar three cortical screws fixation and connectors (Back)     Patient location during evaluation: PACU Anesthesia Type: General Level of consciousness: awake and alert Pain management: pain level controlled Vital Signs Assessment: post-procedure vital signs reviewed and stable Respiratory status: spontaneous breathing, nonlabored ventilation, respiratory function stable and patient connected to nasal cannula oxygen Cardiovascular status: blood pressure returned to baseline and stable Postop Assessment: no apparent nausea or vomiting Anesthetic complications: no   No notable events documented.  Last Vitals:  Vitals:   10/31/20 1945 10/31/20 2001  BP: 121/82 121/77  Pulse: 71 74  Resp: 18 18  Temp: (!) 36.4 C 36.5 C  SpO2: 94% 93%    Last Pain:  Vitals:   10/31/20 2001  TempSrc: Oral  PainSc:                  Kam Kushnir,W. EDMOND

## 2020-10-31 NOTE — Anesthesia Procedure Notes (Signed)
Procedure Name: Intubation Date/Time: 10/31/2020 1:38 PM Performed by: Lowella Dell, CRNA Pre-anesthesia Checklist: Patient identified, Emergency Drugs available, Suction available and Patient being monitored Patient Re-evaluated:Patient Re-evaluated prior to induction Oxygen Delivery Method: Circle System Utilized Preoxygenation: Pre-oxygenation with 100% oxygen Induction Type: IV induction Ventilation: Mask ventilation without difficulty and Oral airway inserted - appropriate to patient size Laryngoscope Size: Mac and 3 Grade View: Grade I Tube type: Oral Tube size: 7.5 mm Number of attempts: 1 Airway Equipment and Method: Stylet Placement Confirmation: ETT inserted through vocal cords under direct vision, positive ETCO2 and breath sounds checked- equal and bilateral Secured at: 22 cm Tube secured with: Tape Dental Injury: Teeth and Oropharynx as per pre-operative assessment

## 2020-10-31 NOTE — Transfer of Care (Signed)
Immediate Anesthesia Transfer of Care Note  Patient: Danny Hampton  Procedure(s) Performed: Posterior Lumbar Fusion  - Lumbar one-Lumbar two with cortical screw fusion Thoracic ten to Lumbar three cortical screws fixation and connectors (Back)  Patient Location: PACU  Anesthesia Type:General  Level of Consciousness: awake, alert  and oriented  Airway & Oxygen Therapy: Patient Spontanous Breathing and Patient connected to nasal cannula oxygen  Post-op Assessment: Report given to RN and Post -op Vital signs reviewed and stable  Post vital signs: Reviewed and stable  Last Vitals:  Vitals Value Taken Time  BP 124/87 10/31/20 1833  Temp 36.7 C 10/31/20 1830  Pulse 78 10/31/20 1843  Resp 11 10/31/20 1843  SpO2 97 % 10/31/20 1843  Vitals shown include unvalidated device data.  Last Pain:  Vitals:   10/31/20 1830  TempSrc:   PainSc: 7       Patients Stated Pain Goal: 3 ([id number])  Complications: No notable events documented.

## 2020-10-31 NOTE — Op Note (Signed)
Reoperative diagnosis: Lumbar instability L1-L2 herniated nucleus pulposus and lumbar spinal stenosis L1-L2 degenerative's disease T10-11 T11-12 and T12-L1  Postoperative diagnosis: Same  Procedure: #1 decompressive lumbar laminectomy L1-L2 with complete medial facetectomies erotic foraminotomies of the L1-L2 nerve root and bilateral lumbar to discectomies at L1-L2.  2.  Cortical screw fixation T10-L3 utilizing the globus modular cortical screw set with bilateral cortical screws placed at T10, T11, T12, L1 and utilizing the addition set to tie into the previous construct at L2-3.  3.  Posterior lateral arthrodesis T10-L2 utilizing locally harvested autograft mixed with vivigen and protios  Surgeon: Dominica Severin Tadashi Burkel  Assistant: Nash Shearer  Anesthesia: General  EBL: 54  HPI: 65 year old gentleman previously undergone L2-S1 fusion over successive operations and developed lumbar instability herniated nucleus pulposis and lumbar spinal stenosis at L1-L2 in addition progressive degenerative disc disease T10-11 T11-12 T12-L1 previous kyphoplasty's at T12 and L1.  Due to patient's progression of clinical syndrome imaging findings and failed conservative treatment I recommended decompressive laminectomy and discectomy at L1-L2 and instrumented posterior lateral fusion from T-10 to L2.  I extensively went over the risks and benefits of that operation with him as well as perioperative course expectations of outcome and alternatives of surgery and he understood and agreed to proceed forward.  Operative procedure: Patient was brought into the OR was [place] general anesthesia positioned prone the Wilson frame his back was prepped and draped in routine sterile fashion his old incision was opened up and extended cephalad subperiosteal dissection was carried lamina of T10, T11, T12, L1-L2 and exposed the hardware from L2-3.  I then remove the spinous process at L1 started the central decompression was marked spinal  stenosis with severe hourglass compression of thecal sac at this level Performed complete medial facetectomies and radical foraminotomies of the L1 and L2 nerve roots bilaterally decompressing the thecal sac and both those nerve roots.  Then working under the thecal sac and performed bilateral discectomies the thecal sac and dura was densely adherent to the disc underneath teased away as much as I could could not get to the central compartment without significant risk of spinal cord conus or thecal sac injury.  So because I performed an aggressive and complete dorsal decompression I did not push against this.  Then after adequate decompression thecal sac been achieved utilizing AP and lateral fluoroscopy I placed cortical screws bilaterally at T10, T11, T12, and L1.  We then utilized the globus addition connector set and with a single side to side connector anchored and connected into the previous construct at L2-3 fashion a couple rods and anchored everything in place from T10-L2.  I aggressively decorticated prior to this all the laminar facet complexes at T10-11 T11-12 T12-L1 and L1-L2 and the TPs at L1 and L2 and packed an extensive mount of autograft mix posterior laterally in this location bilaterally.  Then the rod was placed everything was anchored down packs and additional bone graft around the rods placed a medium Hemovac drain injected Exparel in the fascia and closed the wound in layers with opted Vicryl and a running 4 subcuticular.  Benzoin Dermabond Steri-Strips and sterile dressing applied patient recovery in stable condition.  At the end the case all needle counts and sponge counts were correct.

## 2020-10-31 NOTE — Progress Notes (Signed)
Orthopedic Tech Progress Note Patient Details:  Danny Hampton 1955/03/13 [id number] Patient has brace Patient ID: [name], adult   DOB: Sep 24, 1955, 65 y.o.   MRN: [id number]  Ellouise Newer 10/31/2020, 9:14 PM

## 2020-10-31 NOTE — H&P (Signed)
Danny Hampton is an 65 y.o. adult.   Chief Complaint: Back bilateral hip and leg pain neurogenic claudication HPI: 65 year old gentleman longstanding history with back previous undergone L2-S1 fusion presents now with progressive worsening difficulty walking claudication.  Work-up with CT myelography showed virtual complete block at L1-L2.  This was a segmental degeneration above her previous fusion.  Due to patient's progression of clinical syndrome imaging findings and failed conservative treatment I recommended decompressive laminectomy discectomy at L1-L2 and cortical screw fixation tying into his old construct.  I have extensively gone over the risks and benefits of that operation with him as well as perioperative course expectations of outcome and alternatives to surgery and he understood and agreed to proceed forward.  Past Medical History:  Diagnosis Date   Acid reflux    Anxiety    Arrhythmia    paroxysmal supreventricular tachycardia   Arthritis    Complication of anesthesia    Myotonia congenita   Dysrhythmia    Essential hypertension    Family history of adverse reaction to anesthesia    Sister   High cholesterol    History of skin cancer    Myotonia congenita    Type 2 diabetes mellitus ([place])     Past Surgical History:  Procedure Laterality Date   BACK SURGERY     times 2   CARPAL TUNNEL RELEASE     CATARACT EXTRACTION W/PHACO Left 01/31/2019   Procedure: CATARACT EXTRACTION PHACO AND INTRAOCULAR LENS PLACEMENT ([place]);  Surgeon: Baruch Goldmann, MD;  Location: [facility];  Service: Ophthalmology;  Laterality: Left;  CDE: 2.66   NOSE SURGERY     TOTAL KNEE ARTHROPLASTY Right 10/12/2018   Procedure: RIGHT TOTAL KNEE ARTHROPLASTY;  Surgeon: Garald Balding, MD;  Location: [facility];  Service: Orthopedics;  Laterality: Right;    Family History  Problem Relation Age of Onset   Healthy Mother    Healthy Father    Cancer Father    Social History:  reports that he quit  smoking about 2 years ago. His smoking use included cigarettes. He has a 30.00 pack-year smoking history. He has never used smokeless tobacco. He reports that he does not currently use alcohol. He reports that he does not use drugs.  Allergies: No Known Allergies  Medications Prior to Admission  Medication Sig Dispense Refill   aspirin 81 MG chewable tablet Chew 1 tablet (81 mg total) by mouth daily.     atorvastatin (LIPITOR) 80 MG tablet Take 40 mg by mouth daily.      Carbidopa-Levodopa ER 48.75-195 MG CPCR Take 1 tablet by mouth 4 (four) times daily.     Cholecalciferol (VITAMIN D3) 125 MCG (5000 UT) TABS Take 5,000 Units by mouth daily.      hydrOXYzine (ATARAX/VISTARIL) 25 MG tablet Take 25 mg by mouth in the morning, at noon, in the evening, and at bedtime.  0   lisinopril-hydrochlorothiazide (ZESTORETIC) 20-25 MG tablet Take 1 tablet by mouth daily.     LORazepam (ATIVAN) 1 MG tablet Take 1.5 tablets by mouth in the morning, at noon, and at bedtime.     meloxicam (MOBIC) 15 MG tablet Take 15 mg by mouth daily.     metFORMIN (GLUCOPHAGE) 500 MG tablet Take 500 mg by mouth 2 (two) times daily with a meal.      metoprolol succinate (TOPROL XL) 25 MG 24 hr tablet Take 1 tablet (25 mg total) by mouth daily. 90 tablet 2   Multiple Vitamin (MULTI-VITAMIN) tablet  Take 1 tablet by mouth daily.     pantoprazole (PROTONIX) 40 MG tablet Take 40 mg by mouth daily.     PARoxetine (PAXIL) 40 MG tablet Take 20-40 mg by mouth See admin instructions. Take 40 mg by mouth in the morning and 20 mg in the evening     phenytoin (DILANTIN) 100 MG ER capsule Take 100 mg by mouth 3 (three) times daily.     rOPINIRole (REQUIP) 1 MG tablet Take 0.'5mg'$  at 6pm and '1mg'$  at 9pm. (Patient taking differently: Take 0.5 mg by mouth See admin instructions. Take 0.5 mg by mouth every evening and 6 pm and again at 9 pm) 135 tablet 3   sodium chloride (MURO 128) 5 % ophthalmic solution Place 1 drop into the right eye every 3  (three) hours as needed for eye irritation.     zinc sulfate 220 (50 Zn) MG capsule Take 1 capsule (220 mg total) by mouth daily. 30 capsule 3   albuterol (VENTOLIN HFA) 108 (90 Base) MCG/ACT inhaler Inhale 2 puffs into the lungs every 6 (six) hours as needed for wheezing or shortness of breath. 8 g 1   ascorbic acid (VITAMIN C) 500 MG tablet Take 1 tablet (500 mg total) by mouth daily. (Patient not taking: Reported on 10/16/2020) 30 tablet 1   guaiFENesin-dextromethorphan (ROBITUSSIN DM) 100-10 MG/5ML syrup Take 10 mLs by mouth every 6 (six) hours as needed for cough. 236 mL 0   predniSONE (DELTASONE) 20 MG tablet Take 3 tablets by mouth daily x1 day; then 2 tablets by mouth daily x2 days; then 1 tablet by mouth daily x3 days; then 1/2 tablet by mouth daily x3 days and stop prednisone. 12 tablet 0    Results for orders placed or performed during the hospital encounter of 10/31/20 (from the past 48 hour(s))  Glucose, capillary     Status: Abnormal   Collection Time: 10/31/20 10:44 AM  Result Value Ref Range   Glucose-Capillary 146 (H) 70 - 99 mg/dL    Comment: Glucose reference range applies only to samples taken after fasting for at least 8 hours.   No results found.  Review of Systems  Musculoskeletal:  Positive for back pain.  Neurological:  Positive for weakness and numbness.   Blood pressure 134/90, pulse 85, temperature 97.7 F (36.5 C), temperature source Oral, resp. rate 19, height '5\' 4"'$  (1.626 m), weight 90.7 kg, SpO2 97 %. Physical Exam HENT:     Head: Normocephalic.     Right Ear: Tympanic membrane normal.     Nose: Nose normal.     Mouth/Throat:     Mouth: Mucous membranes are moist.  Cardiovascular:     Rate and Rhythm: Normal rate.  Pulmonary:     Effort: Pulmonary effort is normal.  Abdominal:     General: Abdomen is flat.  Musculoskeletal:        General: Normal range of motion.  Skin:    General: Skin is warm.  Neurological:     Mental Status: He is alert.      Comments: Strength is 5/5 iliopsoas, quads, hamstrings, gastroc, tibialis, and EHL.     Assessment/Plan 65 year old presents for decompression L1-L2 cortical screw fixation T10 L3 posterior lateral arthrodesis  Elaina Hoops, MD 10/31/2020, 12:36 PM

## 2020-11-01 LAB — GLUCOSE, CAPILLARY
Glucose-Capillary: 160 mg/dL — ABNORMAL HIGH (ref 70–99)
Glucose-Capillary: 172 mg/dL — ABNORMAL HIGH (ref 70–99)
Glucose-Capillary: 135 mg/dL — ABNORMAL HIGH (ref 70–99)
Glucose-Capillary: 122 mg/dL — ABNORMAL HIGH (ref 70–99)
Glucose-Capillary: 134 mg/dL — ABNORMAL HIGH (ref 70–99)

## 2020-11-01 LAB — CBC
HCT: 25.6 % — ABNORMAL LOW (ref 39.0–52.0)
Hemoglobin: 9.1 g/dL — ABNORMAL LOW (ref 13.0–17.0)
MCH: 32.9 pg (ref 26.0–34.0)
MCHC: 35.5 g/dL (ref 30.0–36.0)
MCV: 92.4 fL (ref 80.0–100.0)
Platelets: 125 10*3/uL — ABNORMAL LOW (ref 150–400)
RBC: 2.77 MIL/uL — ABNORMAL LOW (ref 4.22–5.81)
RDW: 12.9 % (ref 11.5–15.5)
WBC: 6.4 10*3/uL (ref 4.0–10.5)
nRBC: 0.3 % — ABNORMAL HIGH (ref 0.0–0.2)

## 2020-11-01 LAB — BASIC METABOLIC PANEL
Anion gap: 6 (ref 5–15)
BUN: 9 mg/dL (ref 8–23)
CO2: 30 mmol/L (ref 22–32)
Calcium: 7.8 mg/dL — ABNORMAL LOW (ref 8.9–10.3)
Chloride: 97 mmol/L — ABNORMAL LOW (ref 98–111)
Creatinine, Ser: 0.8 mg/dL (ref 0.61–1.24)
GFR, Estimated: >60 mL/min (ref >60)
Glucose, Bld: 150 mg/dL — ABNORMAL HIGH (ref 70–99)
Potassium: 3.7 mmol/L (ref 3.5–5.1)
Sodium: 133 mmol/L — ABNORMAL LOW (ref 135–145)

## 2020-11-01 MED ORDER — PANTOPRAZOLE SODIUM 40 MG PO TBEC
40.0000 mg | DELAYED_RELEASE_TABLET | Freq: Every day | ORAL | Status: DC
  Administered 2020-11-01 – 2020-11-02 (×2): 40 mg via ORAL
  Filled 2020-11-01 (×2): qty 1

## 2020-11-01 NOTE — Evaluation (Signed)
Physical Therapy Evaluation  Patient Details Name: Danny Hampton MRN: [id number] DOB: [birth year] Today's Date: 11/01/2020   History of Present Illness  Pt is a 65 y/o male who presents s/p L1-L2 laminectomy and T10-L3 cortical screw fixation on 10/31/2020. PMH significant for SVT, HTN, skin CA, DM II, R TKR 2020.   Clinical Impression  Pt admitted with above diagnosis. At the time of PT eval, pt was able to demonstrate transfers and ambulation with gross min guard assist and no AD. Pt was educated on precautions, brace application/wearing schedule, appropriate activity progression, and car transfer. Pt currently with functional limitations due to the deficits listed below (see PT Problem List). Pt will benefit from skilled PT to increase their independence and safety with mobility to allow discharge to the venue listed below.      Follow Up Recommendations No PT follow up;Supervision for mobility/OOB    Equipment Recommendations  None recommended by PT    Recommendations for Other Services       Precautions / Restrictions Precautions Precautions: Fall;Back Precaution Booklet Issued: Yes (comment) Precaution Comments: Reviewed handout and pt was cued for precautions during functional mobility. Required Braces or Orthoses: Spinal Brace Spinal Brace: Lumbar corset;Applied in sitting position Restrictions Weight Bearing Restrictions: No      Mobility  Bed Mobility Overal bed mobility: Needs Assistance Bed Mobility: Rolling;Sidelying to Sit;Sit to Sidelying Rolling: Modified independent (Device/Increase time) Sidelying to sit: Supervision     Sit to sidelying: Supervision General bed mobility comments: VC's for improved log roll technique.    Transfers Overall transfer level: Needs assistance Equipment used: None Transfers: Sit to/from Stand Sit to Stand: Min guard         General transfer comment: Close guard for safety as pt powered up to full stand. VC's for  hand placement on seated surface as pt initiated stand>sit.  Ambulation/Gait Ambulation/Gait assistance: Min guard Gait Distance (Feet): 300 Feet Assistive device: None Gait Pattern/deviations: Step-through pattern;Decreased stride length;Trunk flexed Gait velocity: Decreased Gait velocity interpretation: <1.31 ft/sec, indicative of household ambulator General Gait Details: VC's for improved posture. No assist or overt LOB noted.  Stairs            Wheelchair Mobility    Modified Rankin (Stroke Patients Only)       Balance Overall balance assessment: Mild deficits observed, not formally tested                                           Pertinent Vitals/Pain Pain Assessment: Faces Faces Pain Scale: Hurts little more Pain Location: Incision site Pain Descriptors / Indicators: Operative site guarding;Aching Pain Intervention(s): Limited activity within patient's tolerance;Monitored during session;Repositioned    Home Living Family/patient expects to be discharged to:: Private residence Living Arrangements: Spouse/significant other Available Help at Discharge: Family;Available 24 hours/day Type of Home: House Home Access: Level entry     Home Layout: One level Home Equipment: Walker - 2 wheels;Cane - single point;Grab bars - toilet;Grab bars - tub/shower      Prior Function Level of Independence: Independent with assistive device(s)         Comments: Drives short distances     Hand Dominance        Extremity/Trunk Assessment   Upper Extremity Assessment Upper Extremity Assessment: Defer to OT evaluation    Lower Extremity Assessment Lower Extremity Assessment: Generalized weakness    Cervical /  Trunk Assessment Cervical / Trunk Assessment: Other exceptions Cervical / Trunk Exceptions: s/p surgery  Communication   Communication: No difficulties  Cognition Arousal/Alertness: Awake/alert Behavior During Therapy: WFL for tasks  assessed/performed Overall Cognitive Status: Within Functional Limits for tasks assessed                                        General Comments      Exercises     Assessment/Plan    PT Assessment Patient needs continued PT services  PT Problem List Decreased strength;Decreased activity tolerance;Decreased balance;Decreased mobility;Decreased knowledge of use of DME;Decreased safety awareness;Decreased knowledge of precautions;Pain       PT Treatment Interventions DME instruction;Gait training;Functional mobility training;Therapeutic exercise;Therapeutic activities;Neuromuscular re-education;Patient/family education    PT Goals (Current goals can be found in the Care Plan section)  Acute Rehab PT Goals Patient Stated Goal: Home tomorrow PT Goal Formulation: With patient Time For Goal Achievement: 11/08/20 Potential to Achieve Goals: Good    Frequency Min 5X/week   Barriers to discharge        Co-evaluation               AM-PAC PT "6 Clicks" Mobility  Outcome Measure Help needed turning from your back to your side while in a flat bed without using bedrails?: None Help needed moving from lying on your back to sitting on the side of a flat bed without using bedrails?: A Little Help needed moving to and from a bed to a chair (including a wheelchair)?: A Little Help needed standing up from a chair using your arms (e.g., wheelchair or bedside chair)?: A Little Help needed to walk in hospital room?: A Little Help needed climbing 3-5 steps with a railing? : A Little 6 Click Score: 19    End of Session Equipment Utilized During Treatment: Gait belt Activity Tolerance: Patient tolerated treatment well Patient left: in bed;with call bell/phone within reach Nurse Communication: Mobility status PT Visit Diagnosis: Unsteadiness on feet (R26.81);Pain Pain - part of body:  (back)    Time: [id number] PT Time Calculation (min) (ACUTE ONLY): 24  min   Charges:   PT Evaluation $PT Eval Low Complexity: 1 Low PT Treatments $Gait Training: 8-22 mins        Rolinda Roan, PT, DPT Acute Rehabilitation Services Pager: [phone] Office: [phone]   Thelma Comp 11/01/2020, 11:05 AM

## 2020-11-01 NOTE — Evaluation (Signed)
Occupational Therapy Evaluation Patient Details Name: Danny Hampton MRN: [id number] DOB: Oct 18, 1955 Today's Date: 11/01/2020    History of Present Illness Pt is a 64 y/o male who presents s/p L1-L2 laminectomy and T10-L3 cortical screw fixation on 10/31/2020. PMH significant for SVT, HTN, skin CA, DM II, R TKR 2020.   Clinical Impression   Patient admitted for the diagnosis and procedure above.  PTA he lives with his SO, who can provide supportive assist, but not physical.  Deficits to independence are listed below.  Currently he is needing up to supervision for mobility, and up to [place] for lower body ADL from sit/stand level.  OT will follow in the hospital setting to ensure compliance and safe discharge home.      Follow Up Recommendations  No OT follow up    Equipment Recommendations  None recommended by OT    Recommendations for Other Services       Precautions / Restrictions Precautions Precautions: Fall;Back Precaution Booklet Issued: Yes (comment) Precaution Comments: Issued by PT Required Braces or Orthoses: Spinal Brace Spinal Brace: Lumbar corset;Applied in sitting position Restrictions Weight Bearing Restrictions: No      Mobility Bed Mobility Overal bed mobility: Needs Assistance Bed Mobility: Rolling;Sidelying to Sit;Sit to Sidelying Rolling: Modified independent (Device/Increase time) Sidelying to sit: Supervision     Sit to sidelying: Supervision General bed mobility comments: VC's for improved log roll technique. Patient Response: Cooperative  Transfers Overall transfer level: Needs assistance Equipment used: None Transfers: Sit to/from Stand Sit to Stand: Min guard         General transfer comment: Close guard for safety as pt powered up to full stand. VC's for hand placement on seated surface as pt initiated stand>sit.    Balance Overall balance assessment: Mild deficits observed, not formally tested                                          ADL either performed or assessed with clinical judgement   ADL Overall ADL's : Needs assistance/impaired Eating/Feeding: Independent   Grooming: Wash/dry hands;Wash/dry face;Supervision/safety;Standing           Upper Body Dressing : Min guard;Sitting   Lower Body Dressing: Moderate assistance;Sit to/from stand   Toilet Transfer: Supervision/safety;Ambulation           Functional mobility during ADLs: Supervision/safety       Vision Patient Visual Report: No change from baseline       Perception     Praxis      Pertinent Vitals/Pain Pain Assessment: 0-10 Pain Score: 4  Faces Pain Scale: Hurts little more Pain Location: Incision site Pain Descriptors / Indicators: Operative site guarding;Aching Pain Intervention(s): Monitored during session     Hand Dominance Right   Extremity/Trunk Assessment Upper Extremity Assessment Upper Extremity Assessment: Overall WFL for tasks assessed   Lower Extremity Assessment Lower Extremity Assessment: Defer to PT evaluation   Cervical / Trunk Assessment Cervical / Trunk Assessment: Other exceptions Cervical / Trunk Exceptions: s/p surgery   Communication Communication Communication: No difficulties   Cognition Arousal/Alertness: Awake/alert Behavior During Therapy: WFL for tasks assessed/performed Overall Cognitive Status: Within Functional Limits for tasks assessed                                     General  Comments       Exercises     Shoulder Instructions      Home Living Family/patient expects to be discharged to:: Private residence Living Arrangements: Spouse/significant other Available Help at Discharge: Family;Available 24 hours/day Type of Home: House Home Access: Level entry     Home Layout: One level     Bathroom Shower/Tub: Occupational psychologist: Handicapped height     Home Equipment: Environmental consultant - 2 wheels;Cane - single point;Grab bars -  toilet;Grab bars - tub/shower          Prior Functioning/Environment Level of Independence: Independent with assistive device(s)        Comments: Drives short distances        OT Problem List: Impaired balance (sitting and/or standing);Pain      OT Treatment/Interventions: Self-care/ADL training;Therapeutic activities;DME and/or AE instruction;Balance training    OT Goals(Current goals can be found in the care plan section) Acute Rehab OT Goals Patient Stated Goal: return home to recover OT Goal Formulation: With patient Time For Goal Achievement: 11/15/20 Potential to Achieve Goals: Good ADL Goals Pt Will Perform Grooming: with modified independence;sitting;standing Pt Will Perform Lower Body Bathing: with modified independence;sit to/from stand Pt Will Perform Lower Body Dressing: with modified independence;sit to/from stand  OT Frequency: Min 2X/week   Barriers to D/C:    none noted       Co-evaluation              AM-PAC OT "6 Clicks" Daily Activity     Outcome Measure Help from another person eating meals?: None Help from another person taking care of personal grooming?: None Help from another person toileting, which includes using toliet, bedpan, or urinal?: None Help from another person bathing (including washing, rinsing, drying)?: A Little Help from another person to put on and taking off regular upper body clothing?: None Help from another person to put on and taking off regular lower body clothing?: A Little 6 Click Score: 22   End of Session    Activity Tolerance: Patient tolerated treatment well Patient left: in bed;with call bell/phone within reach  OT Visit Diagnosis: Unsteadiness on feet (R26.81);Pain                Time: [id number] OT Time Calculation (min): 22 min Charges:  OT General Charges $OT Visit: 1 Visit OT Evaluation $OT Eval Moderate Complexity: 1 Mod  11/01/2020  RP, OTR/L  Acute Rehabilitation Services  Office:   [place] 11/01/2020, 12:29 PM

## 2020-11-01 NOTE — Progress Notes (Signed)
Subjective: Patient reports back pain but no leg pain, ambulated in the hallway well  Objective: Vital signs in last 24 hours: Temp:  [97.5 F (36.4 C)-98.1 F (36.7 C)] 98 F (36.7 C) (08/25 0503) Pulse Rate:  [68-90] 68 (08/25 0503) Resp:  [9-20] 20 (08/25 0503) BP: (101-135)/(73-90) 134/81 (08/25 0503) SpO2:  [90 %-99 %] 99 % (08/25 0503) Weight:  [90.7 kg] 90.7 kg (08/24 1043)  Intake/Output from previous day: 08/24 0701 - 08/25 0700 In: 2690 [P.O.:240; I.V.:1750; IV Piggyback:700] Out: 2265 [Urine:1220; Drains:495; Blood:550] Intake/Output this shift: No intake/output data recorded.  Neurologic: Grossly normal  Lab Results: Lab Results  Component Value Date   WBC 7.6 10/22/2020   HGB 13.2 10/22/2020   HCT 37.0 (L) 10/22/2020   MCV 90.0 10/22/2020   PLT 195 10/22/2020   Lab Results  Component Value Date   INR 0.9 10/07/2018   BMET Lab Results  Component Value Date   NA 130 (L) 10/22/2020   K 4.0 10/22/2020   CL 90 (L) 10/22/2020   CO2 29 10/22/2020   GLUCOSE 143 (H) 10/22/2020   BUN 9 10/22/2020   CREATININE 0.70 10/22/2020   CALCIUM 9.1 10/22/2020    Studies/Results: DG THORACOLUMABAR SPINE  Result Date: 10/31/2020 CLINICAL DATA:  Posterior lumbar interbody fusion L1-L2 with cortical screw fusion. Thoracic 10 to L3 cortical screws fixation in connectors. EXAM: THORACOLUMBAR SPINE 1V COMPARISON:  CT myelogram 09/03/2020 FINDINGS: Two fluoroscopic spot views of the thoracolumbar spine obtained in the operating room. Augmented wall correspond to T12 and L1 on prior CT. There are new pedicle screws at T10 and T11. Previous lumbar hardware is partially included. Fluoroscopy time 1 minutes 20 seconds. Dose 52.14 mGy. IMPRESSION: Fluoroscopic spot views of thoracolumbar spine with pedicle screw placement at T10 and T11. Electronically Signed   By: Keith Rake M.D.   On: 10/31/2020 17:48   DG C-Arm 1-60 Min  Result Date: 10/31/2020 CLINICAL DATA:  Posterior  lumbar interbody fusion L1-L2 with cortical screw fusion. Thoracic 10 to L3 cortical screws fixation in connectors. EXAM: THORACOLUMBAR SPINE 1V COMPARISON:  CT myelogram 09/03/2020 FINDINGS: Two fluoroscopic spot views of the thoracolumbar spine obtained in the operating room. Augmented wall correspond to T12 and L1 on prior CT. There are new pedicle screws at T10 and T11. Previous lumbar hardware is partially included. Fluoroscopy time 1 minutes 20 seconds. Dose 52.14 mGy. IMPRESSION: Fluoroscopic spot views of thoracolumbar spine with pedicle screw placement at T10 and T11. Electronically Signed   By: Keith Rake M.D.   On: 10/31/2020 17:48    Assessment/Plan: Postop day 1 extension of fusion Therapy today Keep drain since it had high output overnight.    LOS: 1 day    Ocie Cornfield Novaleigh Kohlman 11/01/2020, 7:54 AM

## 2020-11-02 LAB — CBC WITH DIFFERENTIAL/PLATELET
Abs Immature Granulocytes: 0.05 10*3/uL (ref 0.00–0.07)
Basophils Absolute: 0 10*3/uL (ref 0.0–0.1)
Basophils Relative: 1 %
Eosinophils Absolute: 0.1 10*3/uL (ref 0.0–0.5)
Eosinophils Relative: 1 %
HCT: 22.5 % — ABNORMAL LOW (ref 39.0–52.0)
Hemoglobin: 7.8 g/dL — ABNORMAL LOW (ref 13.0–17.0)
Immature Granulocytes: 1 %
Lymphocytes Relative: 17 %
Lymphs Abs: 1.1 10*3/uL (ref 0.7–4.0)
MCH: 32.2 pg (ref 26.0–34.0)
MCHC: 34.7 g/dL (ref 30.0–36.0)
MCV: 93 fL (ref 80.0–100.0)
Monocytes Absolute: 0.8 10*3/uL (ref 0.1–1.0)
Monocytes Relative: 12 %
Neutro Abs: 4.3 10*3/uL (ref 1.7–7.7)
Neutrophils Relative %: 68 %
Platelets: 122 10*3/uL — ABNORMAL LOW (ref 150–400)
RBC: 2.42 MIL/uL — ABNORMAL LOW (ref 4.22–5.81)
RDW: 12.9 % (ref 11.5–15.5)
WBC: 6.3 10*3/uL (ref 4.0–10.5)
nRBC: 0 % (ref 0.0–0.2)

## 2020-11-02 LAB — BASIC METABOLIC PANEL
Anion gap: 6 (ref 5–15)
BUN: 7 mg/dL — ABNORMAL LOW (ref 8–23)
CO2: 30 mmol/L (ref 22–32)
Calcium: 7.9 mg/dL — ABNORMAL LOW (ref 8.9–10.3)
Chloride: 101 mmol/L (ref 98–111)
Creatinine, Ser: 0.64 mg/dL (ref 0.61–1.24)
GFR, Estimated: >60 mL/min (ref >60)
Glucose, Bld: 153 mg/dL — ABNORMAL HIGH (ref 70–99)
Potassium: 3.6 mmol/L (ref 3.5–5.1)
Sodium: 137 mmol/L (ref 135–145)

## 2020-11-02 LAB — GLUCOSE, CAPILLARY
Glucose-Capillary: 153 mg/dL — ABNORMAL HIGH (ref 70–99)
Glucose-Capillary: 139 mg/dL — ABNORMAL HIGH (ref 70–99)
Glucose-Capillary: 121 mg/dL — ABNORMAL HIGH (ref 70–99)
Glucose-Capillary: 198 mg/dL — ABNORMAL HIGH (ref 70–99)

## 2020-11-02 LAB — PREPARE RBC (CROSSMATCH)

## 2020-11-02 MED ORDER — SODIUM CHLORIDE 0.9 % IV BOLUS
1000.0000 mL | Freq: Once | INTRAVENOUS | Status: AC
  Administered 2020-11-02: 1000 mL via INTRAVENOUS

## 2020-11-02 MED ORDER — SODIUM CHLORIDE 0.9% IV SOLUTION: Freq: Once | INTRAVENOUS | Status: AC

## 2020-11-02 NOTE — Progress Notes (Signed)
Physical Therapy Treatment and Discharge Patient Details Name: Danny Hampton MRN: [id number] DOB: 05-19-1955 Today's Date: 11/02/2020    History of Present Illness Pt is a 65 y/o male who presents s/p L1-L2 laminectomy and T10-L3 cortical screw fixation on 10/31/2020. PMH significant for SVT, HTN, skin CA, DM II, R TKR 2020.    PT Comments    Pt progressing well and has met acute PT goals at this time. Pt was able to verbalize precautions and we reviewed all education on brace application/wearing schedule, car transfer, appropriate activity progression and positioning recommendations. Pt grossly functioning at a mod I level without an AD. BP still soft, however improved by end of session (see below). Will sign off at this time, if needs change please reconsult.   Of note:  Orthostatic BPs Supine 90/66 95bpm  Sitting 102/73 91 bpm  Standing 108/77 100 bpm  Standing after gait training 118/71 107 bpm      Follow Up Recommendations  No PT follow up;Supervision for mobility/OOB     Equipment Recommendations  None recommended by PT    Recommendations for Other Services       Precautions / Restrictions Precautions Precautions: Fall;Back Precaution Booklet Issued: Yes (comment) Precaution Comments: Reviewed precautions during functional mobility. Required Braces or Orthoses: Spinal Brace Spinal Brace: Lumbar corset;Applied in sitting position Restrictions Weight Bearing Restrictions: No    Mobility  Bed Mobility Overal bed mobility: Modified Independent Bed Mobility: Rolling;Sidelying to Sit;Sit to Sidelying           General bed mobility comments: Increased time and effort due to pain but overall able to complete without assistance.    Transfers Overall transfer level: Modified independent Equipment used: None Transfers: Sit to/from Stand           General transfer comment: Pt demonstrated proper hand placement on seated surface for safety. No  assist required. Increased time due to pain.  Ambulation/Gait Ambulation/Gait assistance: Supervision;Modified independent (Device/Increase time) Gait Distance (Feet): 400 Feet Assistive device: None Gait Pattern/deviations: Step-through pattern;Decreased stride length;Trunk flexed Gait velocity: Decreased Gait velocity interpretation: 1.31 - 2.62 ft/sec, indicative of limited community ambulator General Gait Details: VC's for improved posture. No assist or overt LOB noted. Mod I by end of session, with pt occasionally utilizing railing in hall for comfort/balance support.   Stairs         General stair comments: Deferred stair training as pt does not have to negotiate stairs at home.   Wheelchair Mobility    Modified Rankin (Stroke Patients Only)       Balance Overall balance assessment: Mild deficits observed, not formally tested                                          Cognition Arousal/Alertness: Awake/alert Behavior During Therapy: WFL for tasks assessed/performed Overall Cognitive Status: Within Functional Limits for tasks assessed                                        Exercises      General Comments        Pertinent Vitals/Pain Pain Assessment: Faces Faces Pain Scale: Hurts even more Pain Location: Incision site Pain Descriptors / Indicators: Operative site guarding;Aching Pain Intervention(s): Monitored during session;Limited activity within patient's tolerance;Repositioned    Home  Living Family/patient expects to be discharged to:: Private residence Living Arrangements: Spouse/significant other Available Help at Discharge: Family;Available 24 hours/day Type of Home: House Home Access: Level entry   Home Layout: One level Home Equipment: Walker - 2 wheels;Cane - single point;Grab bars - toilet;Grab bars - tub/shower      Prior Function Level of Independence: Independent with assistive device(s)      Comments:  Drives short distances   PT Goals (current goals can now be found in the care plan section) Acute Rehab PT Goals Patient Stated Goal: return home to recover PT Goal Formulation: With patient Time For Goal Achievement: 11/08/20 Potential to Achieve Goals: Good Progress towards PT goals: Progressing toward goals    Frequency    Min 5X/week      PT Plan Current plan remains appropriate    Co-evaluation              AM-PAC PT "6 Clicks" Mobility   Outcome Measure  Help needed turning from your back to your side while in a flat bed without using bedrails?: None Help needed moving from lying on your back to sitting on the side of a flat bed without using bedrails?: A Little Help needed moving to and from a bed to a chair (including a wheelchair)?: A Little Help needed standing up from a chair using your arms (e.g., wheelchair or bedside chair)?: A Little Help needed to walk in hospital room?: A Little Help needed climbing 3-5 steps with a railing? : A Little 6 Click Score: 19    End of Session Equipment Utilized During Treatment: Gait belt Activity Tolerance: Patient tolerated treatment well Patient left: in bed;with call bell/phone within reach Nurse Communication: Mobility status PT Visit Diagnosis: Unsteadiness on feet (R26.81);Pain Pain - part of body:  (back)     Time: 9702-6378 PT Time Calculation (min) (ACUTE ONLY): 31 min  Charges:  $Gait Training: 23-37 mins                     Rolinda Roan, PT, DPT Acute Rehabilitation Services Pager: [phone] Office: [phone]    Thelma Comp 11/02/2020, 11:48 AM

## 2020-11-02 NOTE — Progress Notes (Signed)
Occupational Therapy Treatment Patient Details Name: Danny Hampton MRN: [id number] DOB: 12-12-1955 Today's Date: 11/02/2020    History of present illness Pt is a 65 y/o male who presents s/p L1-L2 laminectomy and T10-L3 cortical screw fixation on 10/31/2020. PMH significant for SVT, HTN, skin CA, DM II, R TKR 2020.   OT comments  Patient is up and moving ad lib in his room.  He was able to demonstrate lower body ADL completion.  He will also have assist as needed from his SO, that he lives with.  No further OT needs in the acute setting.  Patient could consider a long handle sponge and reacher for home use.  [facility] have been arranged.    Follow Up Recommendations  No OT follow up    Equipment Recommendations  None recommended by OT    Recommendations for Other Services      Precautions / Restrictions Precautions Precautions: Back Precaution Booklet Issued: Yes (comment) Precaution Comments: Reviewed precautions during functional mobility. Required Braces or Orthoses: Spinal Brace Spinal Brace: Lumbar corset;Applied in sitting position Restrictions Weight Bearing Restrictions: No       Mobility Bed Mobility Overal bed mobility: Modified Independent Bed Mobility: Rolling;Sidelying to Sit;Sit to Sidelying           General bed mobility comments: sitting EOB on arrival Patient Response: Cooperative  Transfers Overall transfer level: Modified independent Equipment used: None Transfers: Sit to/from Stand           General transfer comment: Up and walking in his room ad lib with no assist.    Balance Overall balance assessment: Mild deficits observed, not formally tested                                         ADL either performed or assessed with clinical judgement   ADL Overall ADL's : Modified independent                                       General ADL Comments: Patient able to complete lower body ADL from  sit/stand level.  Patient not using hip kit, may look to purchase LH Sponge.                       Cognition Arousal/Alertness: Awake/alert Behavior During Therapy: WFL for tasks assessed/performed Overall Cognitive Status: Within Functional Limits for tasks assessed                                                            Pertinent Vitals/ Pain       Pain Assessment: Faces Faces Pain Scale: Hurts little more Pain Location: Incision site Pain Descriptors / Indicators: Operative site guarding;Aching Pain Intervention(s): Monitored during session  Home Living Family/patient expects to be discharged to:: Private residence Living Arrangements: Spouse/significant other Available Help at Discharge: Family;Available 24 hours/day Type of Home: House Home Access: Level entry     Home Layout: One level     Bathroom Shower/Tub: Occupational psychologist: Handicapped height     Home Equipment: Environmental consultant - 2 wheels;Cane -  single point;Grab bars - toilet;Grab bars - tub/shower          Prior Functioning/Environment Level of Independence: Independent with assistive device(s)        Comments: Drives short distances   Frequency           Progress Toward Goals  OT Goals(current goals can now be found in the care plan section)  Progress towards OT goals: Progressing toward goals  Acute Rehab OT Goals Patient Stated Goal: ready to go home OT Goal Formulation: With patient Time For Goal Achievement: 11/15/20 Potential to Achieve Goals: Good  Plan All goals met and education completed, patient discharged from OT services    Co-evaluation                 AM-PAC OT "6 Clicks" Daily Activity     Outcome Measure   Help from another person eating meals?: None Help from another person taking care of personal grooming?: None Help from another person toileting, which includes using toliet, bedpan, or urinal?: None Help from  another person bathing (including washing, rinsing, drying)?: None Help from another person to put on and taking off regular upper body clothing?: None Help from another person to put on and taking off regular lower body clothing?: None 6 Click Score: 24    End of Session    OT Visit Diagnosis: Pain   Activity Tolerance Patient tolerated treatment well   Patient Left in bed;with call bell/phone within reach   Nurse Communication          Time: 9485-4627 OT Time Calculation (min): 17 min  Charges: OT General Charges $OT Visit: 1 Visit OT Treatments $Self Care/Home Management : 8-22 mins  11/02/2020  RP, OTR/L  Acute Rehabilitation Services  Office:  [phone]    Metta Clines 11/02/2020, 1:21 PM

## 2020-11-02 NOTE — Progress Notes (Signed)
Subjective: Patient reports  condition of back pain significant provement lower extremity pain still has pain that radiates into his right groin  Objective: Vital signs in last 24 hours: Temp:  [98.4 F (36.9 C)-99.1 F (37.3 C)] 98.9 F (37.2 C) (08/26 0744) Pulse Rate:  [76-118] 110 (08/26 0744) Resp:  [16-20] 17 (08/26 0744) BP: (91-109)/(51-73) 91/71 (08/26 0744) SpO2:  [93 %-99 %] 97 % (08/26 0744)  Intake/Output from previous day: 08/25 0701 - 08/26 0700 In: -  Out: 290 [Drains:290] Intake/Output this shift: No intake/output data recorded.  Strength 5/5 incision clean dry and intact drain output 66 over last shift  Lab Results: Recent Labs    11/01/20 0730  WBC 6.4  HGB 9.1*  HCT 25.6*  PLT 125*   BMET Recent Labs    11/01/20 0730  NA 133*  K 3.7  CL 97*  CO2 30  GLUCOSE 150*  BUN 9  CREATININE 0.80  CALCIUM 7.8*    Studies/Results: DG THORACOLUMABAR SPINE  Result Date: 10/31/2020 CLINICAL DATA:  Posterior lumbar interbody fusion L1-L2 with cortical screw fusion. Thoracic 10 to L3 cortical screws fixation in connectors. EXAM: THORACOLUMBAR SPINE 1V COMPARISON:  CT myelogram 09/03/2020 FINDINGS: Two fluoroscopic spot views of the thoracolumbar spine obtained in the operating room. Augmented wall correspond to T12 and L1 on prior CT. There are new pedicle screws at T10 and T11. Previous lumbar hardware is partially included. Fluoroscopy time 1 minutes 20 seconds. Dose 52.14 mGy. IMPRESSION: Fluoroscopic spot views of thoracolumbar spine with pedicle screw placement at T10 and T11. Electronically Signed   By: Keith Rake M.D.   On: 10/31/2020 17:48   DG C-Arm 1-60 Min  Result Date: 10/31/2020 CLINICAL DATA:  Posterior lumbar interbody fusion L1-L2 with cortical screw fusion. Thoracic 10 to L3 cortical screws fixation in connectors. EXAM: THORACOLUMBAR SPINE 1V COMPARISON:  CT myelogram 09/03/2020 FINDINGS: Two fluoroscopic spot views of the thoracolumbar  spine obtained in the operating room. Augmented wall correspond to T12 and L1 on prior CT. There are new pedicle screws at T10 and T11. Previous lumbar hardware is partially included. Fluoroscopy time 1 minutes 20 seconds. Dose 52.14 mGy. IMPRESSION: Fluoroscopic spot views of thoracolumbar spine with pedicle screw placement at T10 and T11. Electronically Signed   By: Keith Rake M.D.   On: 10/31/2020 17:48    Assessment/Plan: Postop day 2 thoracolumbar stabilization doing well condition of low blood pressure slightly increased heart rate hematocrit yesterday was 25 we will recheck this morning if it is lower we might need to transfuse we will give him a fluid bolus of normal saline.  LOS: 2 days     Elaina Hoops 11/02/2020, 8:16 AM

## 2020-11-03 LAB — TYPE AND SCREEN
ABO/RH(D): A NEG
Antibody Screen: NEGATIVE
Unit division: 0
Unit division: 0

## 2020-11-03 LAB — BPAM RBC
Blood Product Expiration Date: 202209162359
Blood Product Expiration Date: 202209172359
ISSUE DATE / TIME: 202208261346
ISSUE DATE / TIME: 202208261711
Unit Type and Rh: 600
Unit Type and Rh: 600

## 2020-11-03 LAB — HEMOGLOBIN AND HEMATOCRIT, BLOOD
HCT: 28.7 % — ABNORMAL LOW (ref 39.0–52.0)
Hemoglobin: 10.2 g/dL — ABNORMAL LOW (ref 13.0–17.0)

## 2020-11-03 LAB — GLUCOSE, CAPILLARY: Glucose-Capillary: 165 mg/dL — ABNORMAL HIGH (ref 70–99)

## 2020-11-03 MED ORDER — CYCLOBENZAPRINE HCL 10 MG PO TABS: 10.0000 mg | ORAL_TABLET | Freq: Three times a day (TID) | ORAL | 0 refills | Status: DC | PRN

## 2020-11-03 MED ORDER — OXYCODONE HCL 5 MG PO TABS: 5.0000 mg | ORAL_TABLET | ORAL | 0 refills | Status: DC | PRN

## 2020-11-03 NOTE — Progress Notes (Signed)
Neurosurgery Service Progress Note  Subjective: No acute events overnight, back pain slowly improving, stable groin symptoms from preop   Objective: Vitals:   11/02/20 1902 11/02/20 2345 11/03/20 0557 11/03/20 0740  BP: 111/70 121/76 110/81 127/78  Pulse: (!) 102 87 (!) 101 99  Resp: '18 20 20 17  '$ Temp: 98.8 F (37.1 C) 99.5 F (37.5 C) 99 F (37.2 C) 98.8 F (37.1 C)  TempSrc: Oral Oral Oral Oral  SpO2: 92% 93% 100% 100%  Weight:      Height:        Physical Exam: Strength 5/5 x4, SILTx4, incision c/d/i  Assessment & Plan: 65 y.o. man s/p T10-L3 PSIF, recovering well.  -discharge home today  Judith Part  11/03/20 8:39 AM

## 2020-11-03 NOTE — Progress Notes (Signed)
Patient is discharged from room 3C05 at this time. Alert and in stable condition. IV site d/c'd and instructions read to patient with understanding verbalized and all questions answered. Left unit via wheelchair with all belongings at side.  

## 2020-11-03 NOTE — Discharge Summary (Signed)
Discharge Summary  Date of Admission: 10/31/2020  Date of Discharge: 11/03/20  Attending Physician: Kary Kos, MD  Hospital Course: Patient was admitted following an uncomplicated lumbar decompression and extension of posterior spinal instrumented fusion, now spanning T10-L3. He was recovered in PACU and transferred to [facility]. His hospital course was uncomplicated and the patient was discharged home on 11/03/20. He will follow up in clinic with Dr. Saintclair Halsted in 2 weeks.  Neurologic exam at discharge:  Strength 5/5 x4, SILTx4  Discharge diagnosis: Lumbar stenosis  Judith Part, MD 11/03/20 8:44 AM

## 2020-11-05 MED FILL — Sodium Chloride IV Soln 0.9%: INTRAVENOUS | Qty: 1000 | Status: AC

## 2020-11-05 MED FILL — Heparin Sodium (Porcine) Inj 1000 Unit/ML: INTRAMUSCULAR | Qty: 30 | Status: AC

## 2020-11-23 ENCOUNTER — Emergency Department (HOSPITAL_COMMUNITY): Payer: No Typology Code available for payment source

## 2020-11-23 ENCOUNTER — Inpatient Hospital Stay (HOSPITAL_COMMUNITY)
Admission: EM | Admit: 2020-11-23 | Discharge: 2020-12-07 | DRG: 856 | Disposition: A | Discharge status: Skilled Nursing Facility | Payer: No Typology Code available for payment source | Attending: Internal Medicine | Admitting: Internal Medicine

## 2020-11-23 ENCOUNTER — Encounter (HOSPITAL_COMMUNITY): Payer: Self-pay

## 2020-11-23 DIAGNOSIS — F411 Generalized anxiety disorder: Secondary | ICD-10-CM | POA: Diagnosis present

## 2020-11-23 DIAGNOSIS — G253 Myoclonus: Secondary | ICD-10-CM | POA: Diagnosis present

## 2020-11-23 DIAGNOSIS — I2510 Atherosclerotic heart disease of native coronary artery without angina pectoris: Secondary | ICD-10-CM | POA: Diagnosis present

## 2020-11-23 DIAGNOSIS — Z791 Long term (current) use of non-steroidal anti-inflammatories (NSAID): Secondary | ICD-10-CM

## 2020-11-23 DIAGNOSIS — R7989 Other specified abnormal findings of blood chemistry: Secondary | ICD-10-CM

## 2020-11-23 DIAGNOSIS — F329 Major depressive disorder, single episode, unspecified: Secondary | ICD-10-CM | POA: Diagnosis present

## 2020-11-23 DIAGNOSIS — I959 Hypotension, unspecified: Secondary | ICD-10-CM | POA: Diagnosis not present

## 2020-11-23 DIAGNOSIS — K9189 Other postprocedural complications and disorders of digestive system: Secondary | ICD-10-CM | POA: Diagnosis present

## 2020-11-23 DIAGNOSIS — Z20822 Contact with and (suspected) exposure to covid-19: Secondary | ICD-10-CM | POA: Diagnosis present

## 2020-11-23 DIAGNOSIS — F419 Anxiety disorder, unspecified: Secondary | ICD-10-CM | POA: Diagnosis present

## 2020-11-23 DIAGNOSIS — K219 Gastro-esophageal reflux disease without esophagitis: Secondary | ICD-10-CM | POA: Diagnosis present

## 2020-11-23 DIAGNOSIS — R1084 Generalized abdominal pain: Secondary | ICD-10-CM

## 2020-11-23 DIAGNOSIS — I10 Essential (primary) hypertension: Secondary | ICD-10-CM | POA: Diagnosis present

## 2020-11-23 DIAGNOSIS — B9562 Methicillin resistant Staphylococcus aureus infection as the cause of diseases classified elsewhere: Secondary | ICD-10-CM | POA: Diagnosis present

## 2020-11-23 DIAGNOSIS — D696 Thrombocytopenia, unspecified: Secondary | ICD-10-CM | POA: Diagnosis present

## 2020-11-23 DIAGNOSIS — E860 Dehydration: Secondary | ICD-10-CM | POA: Diagnosis present

## 2020-11-23 DIAGNOSIS — K56600 Partial intestinal obstruction, unspecified as to cause: Secondary | ICD-10-CM | POA: Diagnosis present

## 2020-11-23 DIAGNOSIS — F1011 Alcohol abuse, in remission: Secondary | ICD-10-CM | POA: Diagnosis present

## 2020-11-23 DIAGNOSIS — G7112 Myotonia congenita: Secondary | ICD-10-CM | POA: Diagnosis present

## 2020-11-23 DIAGNOSIS — Z87891 Personal history of nicotine dependence: Secondary | ICD-10-CM

## 2020-11-23 DIAGNOSIS — I4891 Unspecified atrial fibrillation: Secondary | ICD-10-CM | POA: Diagnosis not present

## 2020-11-23 DIAGNOSIS — J9811 Atelectasis: Secondary | ICD-10-CM | POA: Diagnosis present

## 2020-11-23 DIAGNOSIS — B179 Acute viral hepatitis, unspecified: Secondary | ICD-10-CM | POA: Diagnosis not present

## 2020-11-23 DIAGNOSIS — E876 Hypokalemia: Secondary | ICD-10-CM | POA: Diagnosis present

## 2020-11-23 DIAGNOSIS — N179 Acute kidney failure, unspecified: Secondary | ICD-10-CM | POA: Diagnosis not present

## 2020-11-23 DIAGNOSIS — R569 Unspecified convulsions: Secondary | ICD-10-CM | POA: Diagnosis present

## 2020-11-23 DIAGNOSIS — Z981 Arthrodesis status: Secondary | ICD-10-CM

## 2020-11-23 DIAGNOSIS — B9561 Methicillin susceptible Staphylococcus aureus infection as the cause of diseases classified elsewhere: Secondary | ICD-10-CM

## 2020-11-23 DIAGNOSIS — E1149 Type 2 diabetes mellitus with other diabetic neurological complication: Secondary | ICD-10-CM | POA: Diagnosis present

## 2020-11-23 DIAGNOSIS — I4589 Other specified conduction disorders: Secondary | ICD-10-CM | POA: Diagnosis present

## 2020-11-23 DIAGNOSIS — E785 Hyperlipidemia, unspecified: Secondary | ICD-10-CM | POA: Diagnosis present

## 2020-11-23 DIAGNOSIS — G8929 Other chronic pain: Secondary | ICD-10-CM | POA: Diagnosis present

## 2020-11-23 DIAGNOSIS — M545 Low back pain, unspecified: Secondary | ICD-10-CM | POA: Diagnosis present

## 2020-11-23 DIAGNOSIS — Z6836 Body mass index (BMI) 36.0-36.9, adult: Secondary | ICD-10-CM

## 2020-11-23 DIAGNOSIS — G2581 Restless legs syndrome: Secondary | ICD-10-CM | POA: Diagnosis present

## 2020-11-23 DIAGNOSIS — R140 Abdominal distension (gaseous): Secondary | ICD-10-CM | POA: Diagnosis not present

## 2020-11-23 DIAGNOSIS — K567 Ileus, unspecified: Secondary | ICD-10-CM

## 2020-11-23 DIAGNOSIS — R109 Unspecified abdominal pain: Secondary | ICD-10-CM

## 2020-11-23 DIAGNOSIS — D1803 Hemangioma of intra-abdominal structures: Secondary | ICD-10-CM | POA: Diagnosis present

## 2020-11-23 DIAGNOSIS — Z8616 Personal history of COVID-19: Secondary | ICD-10-CM

## 2020-11-23 DIAGNOSIS — I4892 Unspecified atrial flutter: Secondary | ICD-10-CM | POA: Diagnosis not present

## 2020-11-23 DIAGNOSIS — G9341 Metabolic encephalopathy: Secondary | ICD-10-CM | POA: Diagnosis not present

## 2020-11-23 DIAGNOSIS — T8149XA Infection following a procedure, other surgical site, initial encounter: Secondary | ICD-10-CM

## 2020-11-23 DIAGNOSIS — K5981 Ogilvie syndrome: Secondary | ICD-10-CM | POA: Diagnosis present

## 2020-11-23 DIAGNOSIS — R6520 Severe sepsis without septic shock: Secondary | ICD-10-CM | POA: Diagnosis present

## 2020-11-23 DIAGNOSIS — R453 Demoralization and apathy: Secondary | ICD-10-CM | POA: Diagnosis not present

## 2020-11-23 DIAGNOSIS — M1990 Unspecified osteoarthritis, unspecified site: Secondary | ICD-10-CM | POA: Diagnosis present

## 2020-11-23 DIAGNOSIS — I471 Supraventricular tachycardia: Secondary | ICD-10-CM | POA: Diagnosis present

## 2020-11-23 DIAGNOSIS — Z79899 Other long term (current) drug therapy: Secondary | ICD-10-CM

## 2020-11-23 DIAGNOSIS — G062 Extradural and subdural abscess, unspecified: Secondary | ICD-10-CM | POA: Diagnosis present

## 2020-11-23 DIAGNOSIS — E872 Acidosis: Secondary | ICD-10-CM | POA: Diagnosis present

## 2020-11-23 DIAGNOSIS — E1165 Type 2 diabetes mellitus with hyperglycemia: Secondary | ICD-10-CM | POA: Diagnosis present

## 2020-11-23 DIAGNOSIS — Z452 Encounter for adjustment and management of vascular access device: Secondary | ICD-10-CM

## 2020-11-23 DIAGNOSIS — R7881 Bacteremia: Secondary | ICD-10-CM

## 2020-11-23 DIAGNOSIS — E669 Obesity, unspecified: Secondary | ICD-10-CM | POA: Diagnosis present

## 2020-11-23 DIAGNOSIS — R0682 Tachypnea, not elsewhere classified: Secondary | ICD-10-CM

## 2020-11-23 DIAGNOSIS — K5730 Diverticulosis of large intestine without perforation or abscess without bleeding: Secondary | ICD-10-CM | POA: Diagnosis present

## 2020-11-23 DIAGNOSIS — M546 Pain in thoracic spine: Secondary | ICD-10-CM | POA: Diagnosis present

## 2020-11-23 DIAGNOSIS — R748 Abnormal levels of other serum enzymes: Secondary | ICD-10-CM

## 2020-11-23 DIAGNOSIS — J383 Other diseases of vocal cords: Secondary | ICD-10-CM | POA: Diagnosis present

## 2020-11-23 DIAGNOSIS — Z85828 Personal history of other malignant neoplasm of skin: Secondary | ICD-10-CM

## 2020-11-23 DIAGNOSIS — T8141XA Infection following a procedure, superficial incisional surgical site, initial encounter: Principal | ICD-10-CM | POA: Diagnosis present

## 2020-11-23 DIAGNOSIS — E871 Hypo-osmolality and hyponatremia: Secondary | ICD-10-CM | POA: Diagnosis not present

## 2020-11-23 DIAGNOSIS — D62 Acute posthemorrhagic anemia: Secondary | ICD-10-CM | POA: Diagnosis present

## 2020-11-23 DIAGNOSIS — Z96651 Presence of right artificial knee joint: Secondary | ICD-10-CM | POA: Diagnosis present

## 2020-11-23 DIAGNOSIS — R0902 Hypoxemia: Secondary | ICD-10-CM

## 2020-11-23 DIAGNOSIS — K921 Melena: Secondary | ICD-10-CM | POA: Diagnosis present

## 2020-11-23 DIAGNOSIS — Z7984 Long term (current) use of oral hypoglycemic drugs: Secondary | ICD-10-CM

## 2020-11-23 DIAGNOSIS — R45851 Suicidal ideations: Secondary | ICD-10-CM | POA: Diagnosis present

## 2020-11-23 DIAGNOSIS — Z7982 Long term (current) use of aspirin: Secondary | ICD-10-CM

## 2020-11-23 DIAGNOSIS — R0600 Dyspnea, unspecified: Secondary | ICD-10-CM

## 2020-11-23 DIAGNOSIS — E7800 Pure hypercholesterolemia, unspecified: Secondary | ICD-10-CM | POA: Diagnosis present

## 2020-11-23 DIAGNOSIS — A4101 Sepsis due to Methicillin susceptible Staphylococcus aureus: Secondary | ICD-10-CM | POA: Diagnosis present

## 2020-11-23 DIAGNOSIS — K7689 Other specified diseases of liver: Secondary | ICD-10-CM | POA: Diagnosis present

## 2020-11-23 DIAGNOSIS — J9601 Acute respiratory failure with hypoxia: Secondary | ICD-10-CM | POA: Diagnosis present

## 2020-11-23 LAB — CBC WITH DIFFERENTIAL/PLATELET
Abs Immature Granulocytes: 0.14 10*3/uL — ABNORMAL HIGH (ref 0.00–0.07)
Basophils Absolute: 0 10*3/uL (ref 0.0–0.1)
Basophils Relative: 0 %
Eosinophils Absolute: 0 10*3/uL (ref 0.0–0.5)
Eosinophils Relative: 0 %
HCT: 36.3 % — ABNORMAL LOW (ref 39.0–52.0)
Hemoglobin: 12.4 g/dL — ABNORMAL LOW (ref 13.0–17.0)
Immature Granulocytes: 1 %
Lymphocytes Relative: 3 %
Lymphs Abs: 0.4 10*3/uL — ABNORMAL LOW (ref 0.7–4.0)
MCH: 32 pg (ref 26.0–34.0)
MCHC: 34.2 g/dL (ref 30.0–36.0)
MCV: 93.8 fL (ref 80.0–100.0)
Monocytes Absolute: 1 10*3/uL (ref 0.1–1.0)
Monocytes Relative: 7 %
Neutro Abs: 13.3 10*3/uL — ABNORMAL HIGH (ref 1.7–7.7)
Neutrophils Relative %: 89 %
Platelets: 196 10*3/uL (ref 150–400)
RBC: 3.87 MIL/uL — ABNORMAL LOW (ref 4.22–5.81)
RDW: 12.6 % (ref 11.5–15.5)
WBC: 14.9 10*3/uL — ABNORMAL HIGH (ref 4.0–10.5)
nRBC: 0 % (ref 0.0–0.2)

## 2020-11-23 LAB — COMPREHENSIVE METABOLIC PANEL
ALT: 20 U/L (ref 0–44)
AST: 33 U/L (ref 15–41)
Albumin: 4.1 g/dL (ref 3.5–5.0)
Alkaline Phosphatase: 100 U/L (ref 38–126)
Anion gap: 15 (ref 5–15)
BUN: 19 mg/dL (ref 8–23)
CO2: 27 mmol/L (ref 22–32)
Calcium: 8.9 mg/dL (ref 8.9–10.3)
Chloride: 88 mmol/L — ABNORMAL LOW (ref 98–111)
Creatinine, Ser: 0.9 mg/dL (ref 0.61–1.24)
GFR, Estimated: >60 mL/min (ref >60)
Glucose, Bld: 184 mg/dL — ABNORMAL HIGH (ref 70–99)
Potassium: 3.8 mmol/L (ref 3.5–5.1)
Sodium: 130 mmol/L — ABNORMAL LOW (ref 135–145)
Total Bilirubin: 0.6 mg/dL (ref 0.3–1.2)
Total Protein: 7.6 g/dL (ref 6.5–8.1)

## 2020-11-23 IMAGING — CT CT ABD-PELV W/ CM
2 of 5 series · 16 of 46 positions shown, 18 images · IV contrast (Omnipaque or Isovue)
Comparison: None.

CLINICAL DATA: Abdominal pain and abdominal distension.

EXAM:
CT ABDOMEN AND PELVIS WITH CONTRAST
TECHNIQUE: Multidetector CT imaging of the abdomen and pelvis was performed
using the standard protocol following bolus administration of
intravenous contrast.
CONTRAST:  75mL OMNIPAQUE IOHEXOL 350 MG/ML SOLN

[Series 2: axial st · axial · 0.85mm/px · z∈[+710,+1170]mm · 13 of 106 slices shown, 15 images]
[im 7/106  soft-tissue]
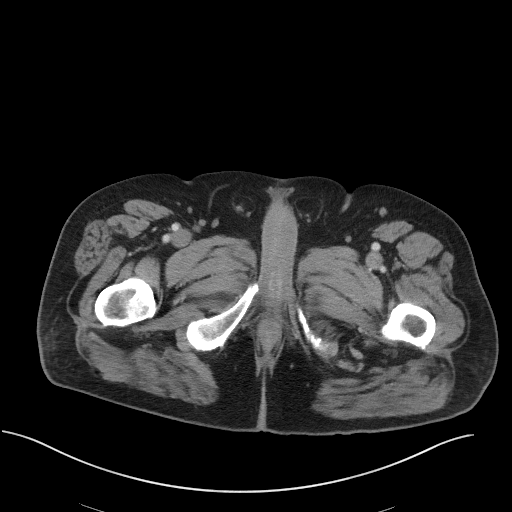
[im 7/106  bone]
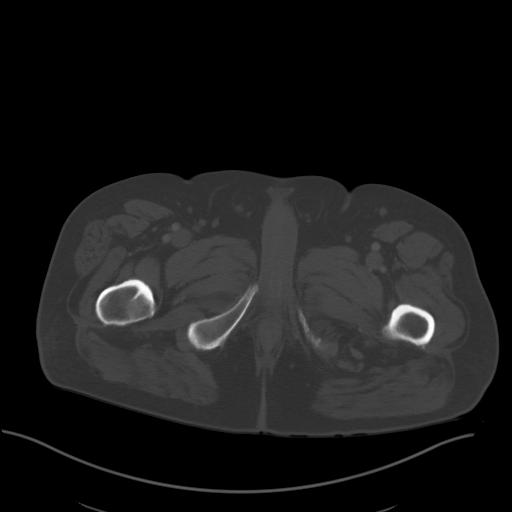
[im 13/106  soft-tissue]
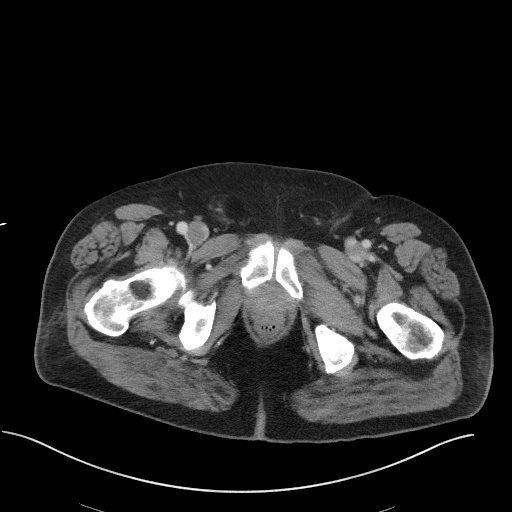
[im 25/106  soft-tissue]
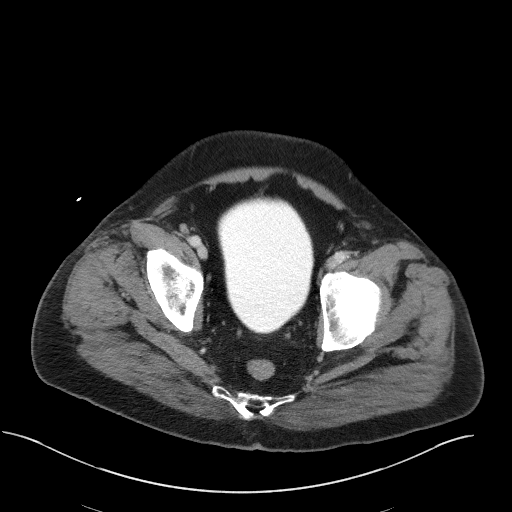
[im 31/106  soft-tissue]
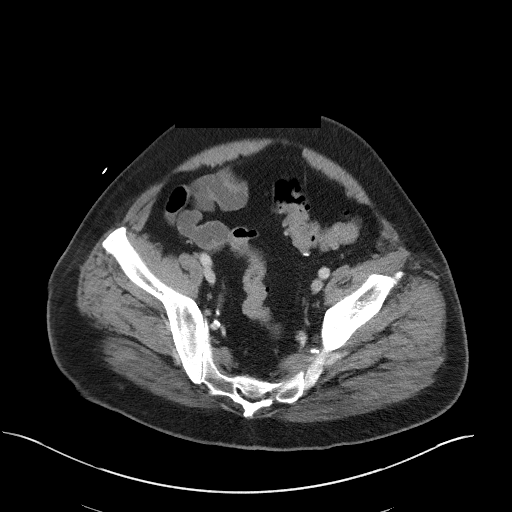
[im 38/106  soft-tissue]
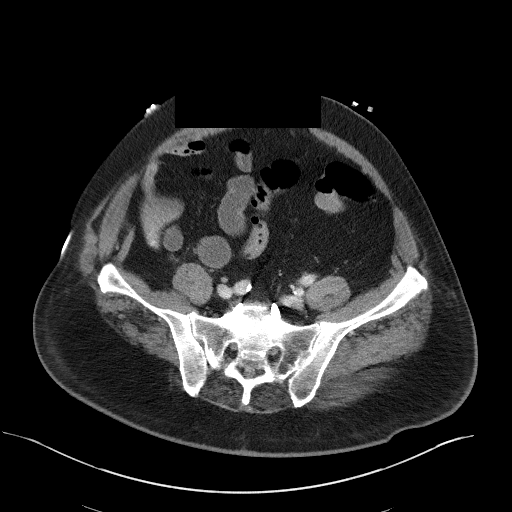
[im 44/106  soft-tissue]
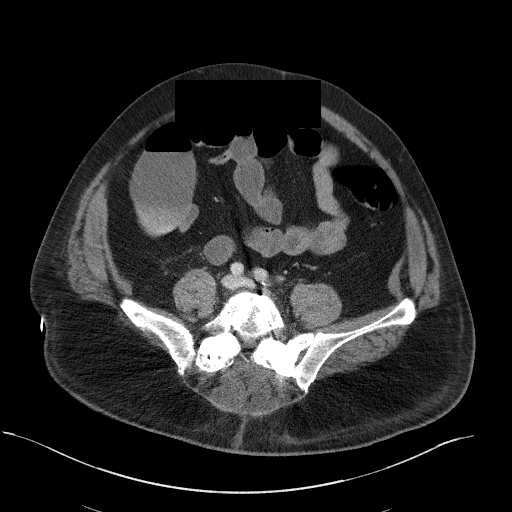
[im 56/106  soft-tissue]
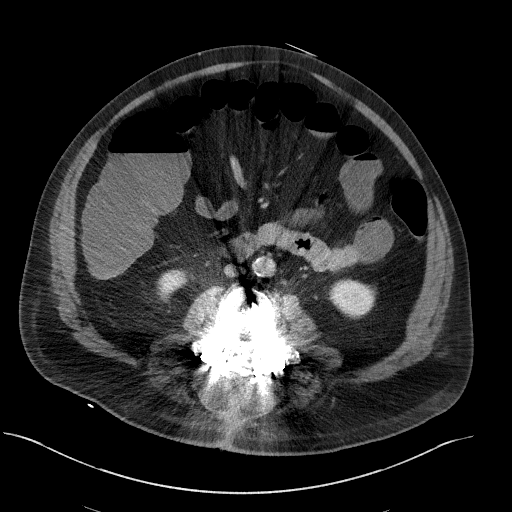
[im 62/106  soft-tissue]
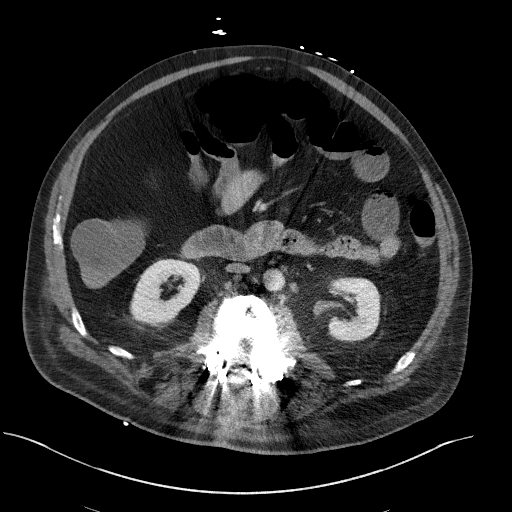
[im 68/106  soft-tissue]
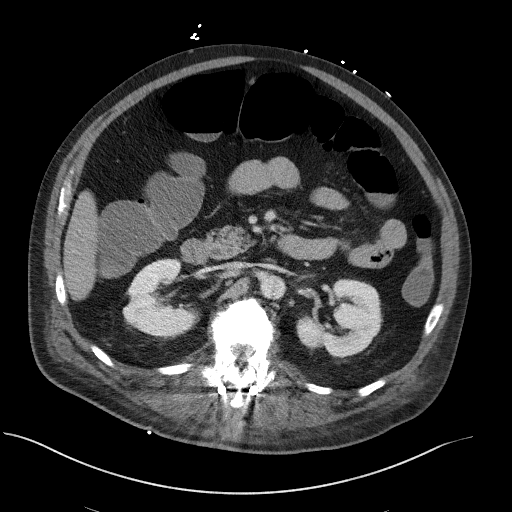
[im 68/106  bone]
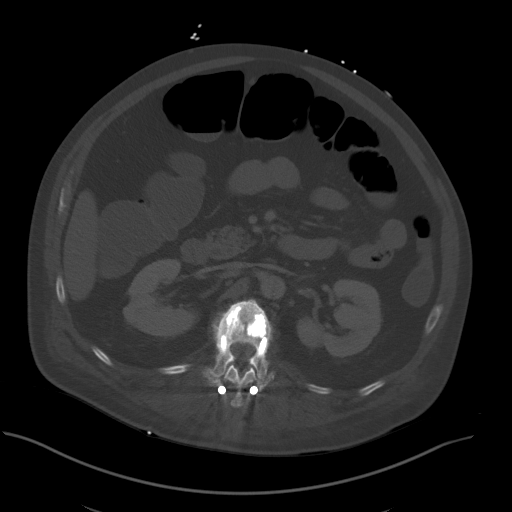
[im 75/106  soft-tissue]
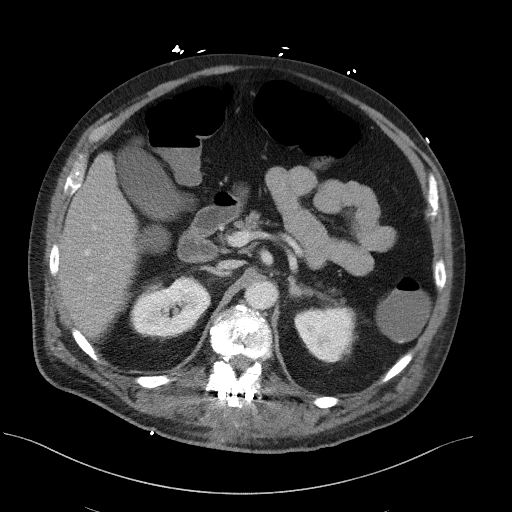
[im 81/106  soft-tissue]
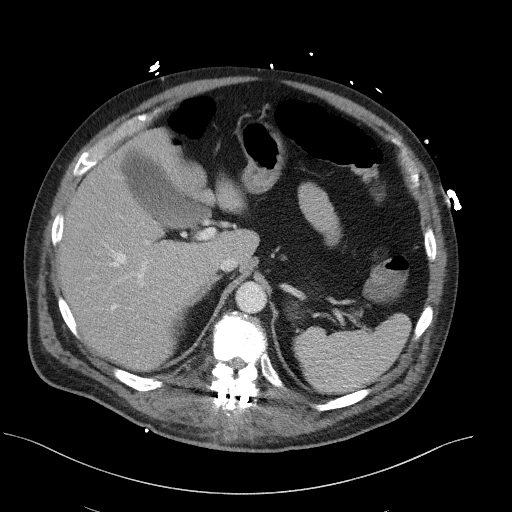
[im 93/106  soft-tissue]
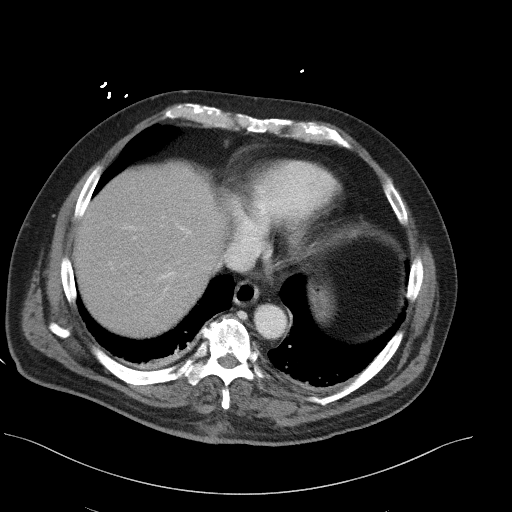
[im 99/106  soft-tissue]
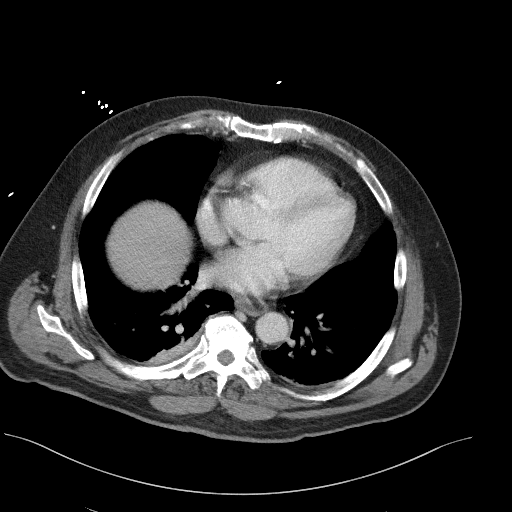

[Series 6: coronal st · coronal · 0.92mm/px · 3 of 120 slices shown]
[im 40/120  soft-tissue]
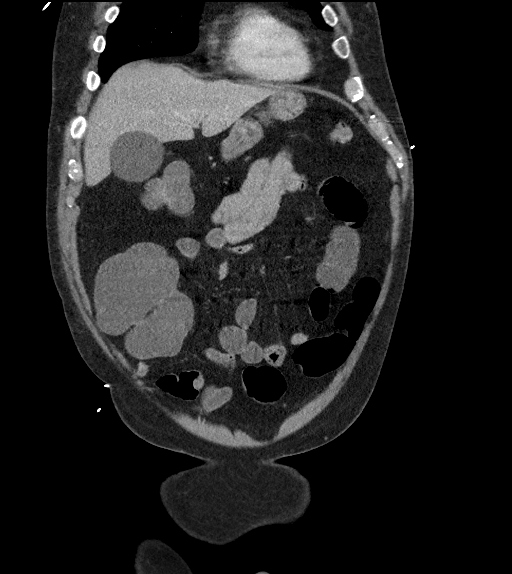
[im 53/120  soft-tissue]
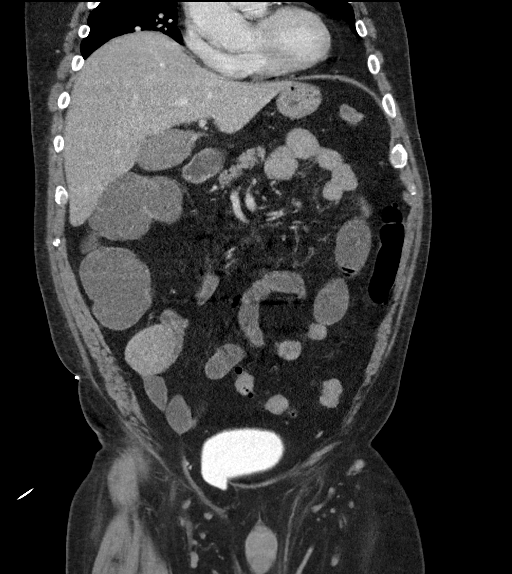
[im 67/120  soft-tissue]
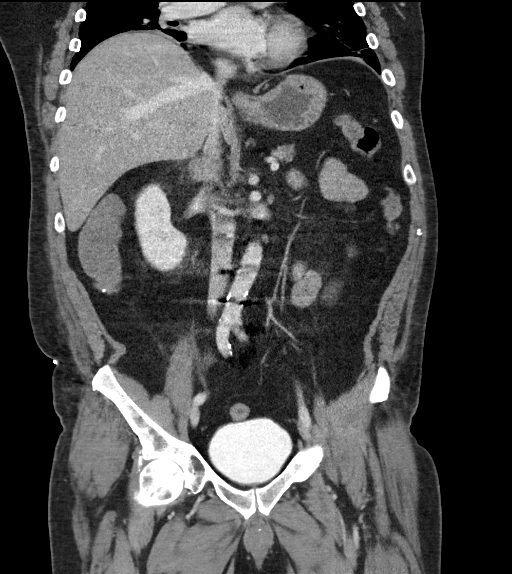

[16 of 46 positions shown; findings below may reference images not displayed]

FINDINGS: Lower chest: There is atelectasis in the lung bases. Coronary artery
calcifications are present.

Hepatobiliary: There is a rounded hypodensity in the left lobe of
the liver which is too small to characterize, most likely a small
cyst or hemangioma. There is no biliary ductal dilatation. There are
no gallstones identified.

Pancreas: Unremarkable. No pancreatic ductal dilatation or
surrounding inflammatory changes.

Spleen: Normal in size without focal abnormality.

Adrenals/Urinary Tract: The bilateral adrenal glands are within
normal limits. There is no hydronephrosis or perinephric fluid.
There is a rounded hypodensity in the right renal cortex which is
too small to characterize, likely a cyst. Small angiomyolipoma
versus cortical scarring is also noted in the right kidney. The
bladder is distended with contrast and within normal limits.

Stomach/Bowel: There is no free air. Proximal colon is distended
with air-fluid levels. The distal colon is decompressed. There is no
definitive transition point visualized. There is sigmoid colon
diverticulosis without evidence for acute diverticulitis.
Additionally, small bowel loops are distended with air-fluid levels
measuring up to 3.2 cm. No definite transition point visualized. The
stomach appears within normal limits. Appendix is within normal
limits.

Vascular/Lymphatic: Aortic atherosclerosis. No enlarged abdominal or
pelvic lymph nodes.

Reproductive: Prostate is unremarkable.

Other: There are small fat containing bilateral inguinal hernias.
There is also small fat containing umbilical hernia. There is no
ascites.

Musculoskeletal: T10-S1 posterior fusion hardware is present.
Vertebroplasty changes are seen at T12 and L1.
IMPRESSION: 1. Mildly dilated small bowel with air-fluid levels. Findings are
concerning for ileus/enteritis. No definite transition point
visualized. Partial small bowel obstruction is not excluded.
2. Air-fluid levels distend the proximal colon and the distal colon
is relatively decompressed. No definite transition point visualized.
Findings can be seen with colonic ileus and/or diarrhea.
3. Sigmoid colon diverticulosis without evidence for diverticulitis.

## 2020-11-23 MED ORDER — SODIUM CHLORIDE 0.9 % IV SOLN: INTRAVENOUS | Status: DC

## 2020-11-23 MED ORDER — METOPROLOL SUCCINATE ER 25 MG PO TB24
25.0000 mg | ORAL_TABLET | Freq: Every day | ORAL | Status: DC
  Administered 2020-11-23: 25 mg via ORAL
  Filled 2020-11-23 (×2): qty 1

## 2020-11-23 MED ORDER — HYDROMORPHONE HCL 1 MG/ML IJ SOLN
1.0000 mg | Freq: Once | INTRAMUSCULAR | Status: AC
  Administered 2020-11-23: 1 mg via INTRAVENOUS
  Filled 2020-11-23: qty 1

## 2020-11-23 MED ORDER — IOHEXOL 350 MG/ML SOLN
75.0000 mL | Freq: Once | INTRAVENOUS | Status: AC | PRN
  Administered 2020-11-23: 75 mL via INTRAVENOUS

## 2020-11-23 NOTE — ED Notes (Signed)
Pt. Does not have bright red blood present in current stool.

## 2020-11-23 NOTE — ED Triage Notes (Addendum)
Pt. Arrived via [facility] with complaints of constipation. Pt. Was seen yesterday at [facility] head and given miralax and mag citratate. Pt. States they had a bowel movement earlier today and saw bright red blood in their stool. Pt is currently in the restroom having a bowel movement. Pt. Was started on pain medication 3 weeks ago. Shortly after pt. Became constipated.

## 2020-11-23 NOTE — ED Provider Notes (Addendum)
[facility] EMERGENCY DEPARTMENT Provider Note   CSN: [id number] Arrival date & time: 11/23/20  1933     History Chief Complaint  Patient presents with   Constipation    Danny Hampton is a 65 y.o. adult.  Patient believes that he has significant constipation.  Has had abdominal distention and abdominal pain now for several days.  He was seen at [facility] yesterday underwent CT dissection type study.  Without any significant findings.  Did not make a direct comment about stool load.  However showed no evidence of obstruction.  Patient was given marrow MiraLAX and mag citrate.  Patient states that he did have pretty good bowel movement here but he still feels as if he is all blocked up.  States that the pain is worse in his abdomen.  Patient is on chronic pain medicines.  Patient states he had some red blood in his stool earlier today but it did not stain the toilet water all red.  Did have a bowel movement here with no blood.  Patient is tachycardic.  But he has not taken his meta propyl all today.  And also patient does have pain.  Not hypotensive no fever.      Past Medical History:  Diagnosis Date   Acid reflux    Anxiety    Arrhythmia    paroxysmal supreventricular tachycardia   Arthritis    Complication of anesthesia    Myotonia congenita   Dysrhythmia    Essential hypertension    Family history of adverse reaction to anesthesia    Sister   High cholesterol    History of skin cancer    Myotonia congenita    Type 2 diabetes mellitus ([place])     Patient Active Problem List   Diagnosis Date Noted   Spinal stenosis of lumbar region 10/31/2020   Primary hypertension    Hyperlipidemia    Acute respiratory failure with hypoxia ([place]) 04/21/2020   Hyponatremia 04/21/2020   Hypoalbuminemia 04/21/2020   GERD (gastroesophageal reflux disease) 04/21/2020   Hyperglycemia due to diabetes mellitus ([place]) 04/21/2020   Obesity (BMI 30-39.9) 04/21/2020   Myotonia  congenita 04/21/2020   Acute respiratory failure due to COVID-19 ([place]) 04/20/2020   Pain in right shoulder 09/21/2019   Total knee replacement status, right 10/12/2018   Unilateral primary osteoarthritis, right knee 09/30/2018   Cervicalgia 04/21/2018   Bilateral primary osteoarthritis of knee 01/03/2016    Past Surgical History:  Procedure Laterality Date   BACK SURGERY     times 2   CARPAL TUNNEL RELEASE     CATARACT EXTRACTION W/PHACO Left 01/31/2019   Procedure: CATARACT EXTRACTION PHACO AND INTRAOCULAR LENS PLACEMENT ([place]);  Surgeon: Baruch Goldmann, MD;  Location: [facility];  Service: Ophthalmology;  Laterality: Left;  CDE: 2.66   NOSE SURGERY     TOTAL KNEE ARTHROPLASTY Right 10/12/2018   Procedure: RIGHT TOTAL KNEE ARTHROPLASTY;  Surgeon: Garald Balding, MD;  Location: [facility];  Service: Orthopedics;  Laterality: Right;     OB History   No obstetric history on file.     Family History  Problem Relation Age of Onset   Healthy Mother    Healthy Father    Cancer Father     Social History   Tobacco Use   Smoking status: Former    Packs/day: 1.00    Years: 30.00    Pack years: 30.00    Types: Cigarettes    Quit date: 03/2018    Years since  quitting: 2.7   Smokeless tobacco: Never  Vaping Use   Vaping Use: Never used  Substance Use Topics   Alcohol use: Not Currently    Comment: occasional drink   Drug use: No    Home Medications Prior to Admission medications   Medication Sig Start Date End Date Taking? Authorizing Provider  aspirin 81 MG chewable tablet Chew 1 tablet (81 mg total) by mouth daily. 04/26/20  Yes Barton Dubois, MD  atorvastatin (LIPITOR) 80 MG tablet Take 40 mg by mouth daily.    Yes [provider]  Carbidopa-Levodopa ER 48.75-195 MG CPCR Take 1 tablet by mouth 4 (four) times daily.   Yes [provider]  Cholecalciferol (VITAMIN D3) 125 MCG (5000 UT) TABS Take 5,000 Units by mouth daily.    Yes [provider]   cyclobenzaprine (FLEXERIL) 10 MG tablet Take 1 tablet (10 mg total) by mouth 3 (three) times daily as needed for muscle spasms. 11/03/20  Yes Judith Part, MD  docusate sodium (COLACE) 100 MG capsule Take 100 mg by mouth daily.   Yes [provider]  hydrOXYzine (ATARAX/VISTARIL) 25 MG tablet Take 25 mg by mouth in the morning, at noon, in the evening, and at bedtime.   Yes [provider]  lisinopril-hydrochlorothiazide (ZESTORETIC) 20-25 MG tablet Take 1 tablet by mouth daily.   Yes [provider]  loratadine (CLARITIN) 10 MG tablet Take 10 mg by mouth daily.   Yes [provider]  LORazepam (ATIVAN) 1 MG tablet Take 1.5 tablets by mouth in the morning, at noon, and at bedtime. 07/18/20  Yes [provider]  meloxicam (MOBIC) 15 MG tablet Take 15 mg by mouth daily.   Yes [provider]  metFORMIN (GLUCOPHAGE) 500 MG tablet Take 500 mg by mouth 2 (two) times daily with a meal.    Yes [provider]  metoprolol succinate (TOPROL XL) 25 MG 24 hr tablet Take 1 tablet (25 mg total) by mouth daily. 02/01/19  Yes Satira Sark, MD  Multiple Vitamin (MULTI-VITAMIN) tablet Take 1 tablet by mouth daily.   Yes [provider]  pantoprazole (PROTONIX) 40 MG tablet Take 40 mg by mouth daily.   Yes [provider]  PARoxetine (PAXIL) 40 MG tablet Take 20-40 mg by mouth See admin instructions. Take 40 mg by mouth in the morning and 20 mg in the evening   Yes [provider]  phenytoin (DILANTIN) 100 MG ER capsule Take 100 mg by mouth 3 (three) times daily.   Yes [provider]  rOPINIRole (REQUIP) 1 MG tablet Take 0.'5mg'$  at 6pm and '1mg'$  at 9pm. Patient taking differently: Take 0.5 mg by mouth See admin instructions. Take 0.5 mg by mouth every evening and 6 pm and again at 9 pm 03/21/19  Yes Patel, Donika K, DO  sodium chloride (MURO 128) 5 % ophthalmic solution Place 1 drop into the right eye every 3  (three) hours as needed for eye irritation.   Yes [provider]  traMADol (ULTRAM) 50 MG tablet Take by mouth. 11/23/20 11/28/20 Yes [provider]  zinc sulfate 220 (50 Zn) MG capsule Take 1 capsule (220 mg total) by mouth daily. 04/26/20  Yes Barton Dubois, MD  albuterol (VENTOLIN HFA) 108 (90 Base) MCG/ACT inhaler Inhale 2 puffs into the lungs every 6 (six) hours as needed for wheezing or shortness of breath. Patient not taking: Reported on 11/23/2020 04/26/20   Barton Dubois, MD  ascorbic acid (VITAMIN C)  500 MG tablet Take 1 tablet (500 mg total) by mouth daily. Patient not taking: Reported on 10/16/2020 04/26/20   Barton Dubois, MD  guaiFENesin-dextromethorphan [facility] DM) 100-10 MG/5ML syrup Take 10 mLs by mouth every 6 (six) hours as needed for cough. Patient not taking: Reported on 11/23/2020 04/26/20   Barton Dubois, MD  methocarbamol (ROBAXIN) 500 MG tablet Take by mouth. Patient not taking: No sig reported 11/23/20 12/03/20  [provider]  oxyCODONE (OXY IR/ROXICODONE) 5 MG immediate release tablet Take 1 tablet (5 mg total) by mouth every 3 (three) hours as needed (pain). Patient not taking: No sig reported 11/03/20   Judith Part, MD    Allergies    Patient has no known allergies.  Review of Systems   Review of Systems  Constitutional:  Negative for chills and fever.  HENT:  Negative for ear pain and sore throat.   Eyes:  Negative for pain and visual disturbance.  Respiratory:  Negative for cough and shortness of breath.   Cardiovascular:  Negative for chest pain and palpitations.  Gastrointestinal:  Positive for abdominal distention, abdominal pain and constipation. Negative for vomiting.  Genitourinary:  Negative for dysuria and hematuria.  Musculoskeletal:  Negative for arthralgias and back pain.  Skin:  Negative for color change and rash.  Neurological:  Negative for seizures and syncope.  All other systems reviewed and are  negative.  Physical Exam Updated Vital Signs BP 112/66   Pulse (!) 127   Temp 98.1 F (36.7 C) (Oral)   Resp (!) 22   Ht 1.626 m ('5\' 4"'$ )   Wt 90.7 kg   SpO2 95%   BMI 34.33 kg/m   Physical Exam Vitals and nursing note reviewed.  Constitutional:      Appearance: Normal appearance. He is well-developed.  HENT:     Head: Normocephalic and atraumatic.  Eyes:     Extraocular Movements: Extraocular movements intact.     Conjunctiva/sclera: Conjunctivae normal.     Pupils: Pupils are equal, round, and reactive to light.  Cardiovascular:     Rate and Rhythm: Normal rate and regular rhythm.     Heart sounds: No murmur heard. Pulmonary:     Effort: Pulmonary effort is normal. No respiratory distress.     Breath sounds: Normal breath sounds.  Abdominal:     General: There is distension.     Palpations: Abdomen is soft.     Tenderness: There is abdominal tenderness.  Musculoskeletal:        General: Normal range of motion.     Cervical back: Normal range of motion and neck supple.  Skin:    General: Skin is warm and dry.     Coloration: Skin is not jaundiced.  Neurological:     General: No focal deficit present.     Mental Status: He is alert and oriented to person, place, and time.     Cranial Nerves: No cranial nerve deficit.     Sensory: No sensory deficit.     Motor: No weakness.    ED Results / Procedures / Treatments   Labs (all labs ordered are listed, but only abnormal results are displayed) Labs Reviewed  CBC WITH DIFFERENTIAL/PLATELET - Abnormal; Notable for the following components:      Result Value   WBC 14.9 (*)    RBC 3.87 (*)    Hemoglobin 12.4 (*)    HCT 36.3 (*)    Neutro Abs 13.3 (*)    Lymphs Abs 0.4 (*)  Abs Immature Granulocytes 0.14 (*)    All other components within normal limits  COMPREHENSIVE METABOLIC PANEL - Abnormal; Notable for the following components:   Sodium 130 (*)    Chloride 88 (*)    Glucose, Bld 184 (*)    All other  components within normal limits    EKG EKG Interpretation  Date/Time:  Friday November 23 2020 19:55:46 EDT Ventricular Rate:  140 PR Interval:  166 QRS Duration: 83 QT Interval:  327 QTC Calculation: 500 R Axis:   86 Text Interpretation: Sinus tachycardia Borderline right axis deviation Low voltage, precordial leads Nonspecific T abnormalities, diffuse leads Borderline prolonged QT interval Confirmed by Fredia Sorrow [phone]) on 11/23/2020 8:03:43 PM  Radiology CT Abdomen Pelvis W Contrast  Result Date: 11/23/2020 CLINICAL DATA:  Abdominal pain and abdominal distension. EXAM: CT ABDOMEN AND PELVIS WITH CONTRAST TECHNIQUE: Multidetector CT imaging of the abdomen and pelvis was performed using the standard protocol following bolus administration of intravenous contrast. CONTRAST:  58m OMNIPAQUE IOHEXOL 350 MG/ML SOLN COMPARISON:  None. FINDINGS: Lower chest: There is atelectasis in the lung bases. Coronary artery calcifications are present. Hepatobiliary: There is a rounded hypodensity in the left lobe of the liver which is too small to characterize, most likely a small cyst or hemangioma. There is no biliary ductal dilatation. There are no gallstones identified. Pancreas: Unremarkable. No pancreatic ductal dilatation or surrounding inflammatory changes. Spleen: Normal in size without focal abnormality. Adrenals/Urinary Tract: The bilateral adrenal glands are within normal limits. There is no hydronephrosis or perinephric fluid. There is a rounded hypodensity in the right renal cortex which is too small to characterize, likely a cyst. 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 The bladder is distended with contrast and within normal limits. Stomach/Bowel: There is no free air. Proximal colon is distended with air-fluid levels. The distal colon is decompressed. There is no definitive transition point visualized. There is sigmoid colon diverticulosis without  evidence for acute diverticulitis. Additionally, small bowel loops are distended with air-fluid levels measuring up to 3.2 cm. No definite transition point visualized. The stomach appears within normal limits. Appendix is within normal limits. Vascular/Lymphatic: Aortic atherosclerosis. No enlarged abdominal or pelvic lymph nodes. Reproductive: Prostate is unremarkable. Other: There are small fat containing bilateral inguinal hernias. There is also small fat containing umbilical hernia. There is no ascites. Musculoskeletal: T10-S1 posterior fusion hardware is present. Vertebroplasty changes are seen at T12 and L1. IMPRESSION: 1. Mildly dilated small bowel with air-fluid levels. Findings are concerning for ileus/enteritis. No definite transition point visualized. Partial small bowel obstruction is not excluded. 2. Air-fluid levels distend the proximal colon and the distal colon is relatively decompressed. No definite transition point visualized. Findings can be seen with colonic ileus and/or diarrhea. 3. Sigmoid colon diverticulosis without evidence for diverticulitis. Electronically Signed   By: ARonney AstersM.D.   On: 11/23/2020 22:54    Procedures Procedures   Medications Ordered in ED Medications  0.9 %  sodium chloride infusion ( Intravenous New Bag/Given 11/23/20 2130)  metoprolol succinate (TOPROL-XL) 24 hr tablet 25 mg (25 mg Oral Given 11/23/20 2135)  HYDROmorphone (DILAUDID) injection 1 mg (1 mg Intravenous Given 11/23/20 2018)  HYDROmorphone (DILAUDID) injection 1 mg (1 mg Intravenous Given 11/23/20 2125)  iohexol (OMNIPAQUE) 350 MG/ML injection 75 mL (75 mLs Intravenous Contrast Given 11/23/20 2204)  HYDROmorphone (DILAUDID) injection 1 mg (1 mg Intravenous Given 11/24/20 0019)    ED Course  I have reviewed the triage  vital signs and the nursing notes.  Pertinent labs & imaging results that were available during my care of the patient were reviewed by me and considered in my medical decision  making (see chart for details).    MDM Rules/Calculators/A&P                           Patient seen at [facility] yesterday.  Had CT scan abdomen dissection study without any significant findings.  Patient's white blood cell count yesterday was 11.3.  Today is 14.9.  Patient is convinced that he has significant constipation.  Complete metabolic panel without any significant changes compared to yesterday.  Urinalysis yesterday was negative.  Lipase was up a little bit at 110.  Patient stating pain is much worse.  Patient was concerned about blood in his bowel movements be had a bowel movement here without any evidence of any gross blood.  His hemoglobin here today is 12.4.  It was 13.1 yesterday.  Platelets are normal.  Based on this we will go ahead and reCT of the abdomen.  And pelvis.  Since patient feels that he is worse.  Also treating with pain medication we will give some IV fluids.  Patient is already taking MiraLAX at home.  This could be increased.  So patient was discharged with tramadol yesterday day.  Patient is already on oxycodone.  CT scan does not show any constipation matter fact colon is decompressed.  Does seem to be consistent with ileus but possible early small bowel obstruction.  Patient does have a leukocytosis.  Will discuss with Dr. Constance Haw from general surgery probably best to admit the patient and make him n.p.o. given bowel rest and observe.  Most likely hospitalist admission  Final Clinical Impression(s) / ED Diagnoses Final diagnoses:  Generalized abdominal pain  Ileus ([place])  Abdominal distention    Rx / DC Orders ED Discharge Orders     None        Fredia Sorrow, MD 11/23/20 2121    Fredia Sorrow, MD 11/24/20 [phone]

## 2020-11-23 NOTE — ED Notes (Signed)
Pts. O2 was around 87-88. Placed pt. On 2 L of O2.

## 2020-11-24 ENCOUNTER — Observation Stay (HOSPITAL_COMMUNITY): Payer: No Typology Code available for payment source

## 2020-11-24 ENCOUNTER — Other Ambulatory Visit: Payer: Self-pay

## 2020-11-24 DIAGNOSIS — R748 Abnormal levels of other serum enzymes: Secondary | ICD-10-CM | POA: Diagnosis not present

## 2020-11-24 DIAGNOSIS — E785 Hyperlipidemia, unspecified: Secondary | ICD-10-CM | POA: Diagnosis not present

## 2020-11-24 DIAGNOSIS — K219 Gastro-esophageal reflux disease without esophagitis: Secondary | ICD-10-CM | POA: Diagnosis not present

## 2020-11-24 DIAGNOSIS — R453 Demoralization and apathy: Secondary | ICD-10-CM | POA: Diagnosis not present

## 2020-11-24 DIAGNOSIS — R569 Unspecified convulsions: Secondary | ICD-10-CM | POA: Diagnosis present

## 2020-11-24 DIAGNOSIS — T8149XA Infection following a procedure, other surgical site, initial encounter: Secondary | ICD-10-CM | POA: Diagnosis not present

## 2020-11-24 DIAGNOSIS — Z8616 Personal history of COVID-19: Secondary | ICD-10-CM | POA: Diagnosis not present

## 2020-11-24 DIAGNOSIS — E1165 Type 2 diabetes mellitus with hyperglycemia: Secondary | ICD-10-CM | POA: Diagnosis present

## 2020-11-24 DIAGNOSIS — J9601 Acute respiratory failure with hypoxia: Secondary | ICD-10-CM | POA: Diagnosis present

## 2020-11-24 DIAGNOSIS — F329 Major depressive disorder, single episode, unspecified: Secondary | ICD-10-CM | POA: Diagnosis present

## 2020-11-24 DIAGNOSIS — R7989 Other specified abnormal findings of blood chemistry: Secondary | ICD-10-CM | POA: Diagnosis not present

## 2020-11-24 DIAGNOSIS — R1084 Generalized abdominal pain: Secondary | ICD-10-CM | POA: Diagnosis present

## 2020-11-24 DIAGNOSIS — R140 Abdominal distension (gaseous): Secondary | ICD-10-CM

## 2020-11-24 DIAGNOSIS — K9189 Other postprocedural complications and disorders of digestive system: Secondary | ICD-10-CM | POA: Diagnosis not present

## 2020-11-24 DIAGNOSIS — D696 Thrombocytopenia, unspecified: Secondary | ICD-10-CM | POA: Diagnosis present

## 2020-11-24 DIAGNOSIS — M545 Low back pain, unspecified: Secondary | ICD-10-CM | POA: Diagnosis not present

## 2020-11-24 DIAGNOSIS — I471 Supraventricular tachycardia: Secondary | ICD-10-CM | POA: Diagnosis present

## 2020-11-24 DIAGNOSIS — G7112 Myotonia congenita: Secondary | ICD-10-CM | POA: Diagnosis not present

## 2020-11-24 DIAGNOSIS — G9341 Metabolic encephalopathy: Secondary | ICD-10-CM | POA: Diagnosis not present

## 2020-11-24 DIAGNOSIS — R6520 Severe sepsis without septic shock: Secondary | ICD-10-CM | POA: Diagnosis present

## 2020-11-24 DIAGNOSIS — K567 Ileus, unspecified: Secondary | ICD-10-CM | POA: Diagnosis not present

## 2020-11-24 DIAGNOSIS — G062 Extradural and subdural abscess, unspecified: Secondary | ICD-10-CM | POA: Diagnosis present

## 2020-11-24 DIAGNOSIS — I959 Hypotension, unspecified: Secondary | ICD-10-CM | POA: Diagnosis not present

## 2020-11-24 DIAGNOSIS — F1011 Alcohol abuse, in remission: Secondary | ICD-10-CM | POA: Diagnosis present

## 2020-11-24 DIAGNOSIS — I10 Essential (primary) hypertension: Secondary | ICD-10-CM | POA: Diagnosis present

## 2020-11-24 DIAGNOSIS — R0902 Hypoxemia: Secondary | ICD-10-CM | POA: Diagnosis not present

## 2020-11-24 DIAGNOSIS — E669 Obesity, unspecified: Secondary | ICD-10-CM | POA: Diagnosis present

## 2020-11-24 DIAGNOSIS — T8141XA Infection following a procedure, superficial incisional surgical site, initial encounter: Secondary | ICD-10-CM | POA: Diagnosis present

## 2020-11-24 DIAGNOSIS — G2581 Restless legs syndrome: Secondary | ICD-10-CM | POA: Diagnosis present

## 2020-11-24 DIAGNOSIS — A4101 Sepsis due to Methicillin susceptible Staphylococcus aureus: Secondary | ICD-10-CM | POA: Diagnosis present

## 2020-11-24 DIAGNOSIS — R7881 Bacteremia: Secondary | ICD-10-CM | POA: Diagnosis not present

## 2020-11-24 DIAGNOSIS — E1149 Type 2 diabetes mellitus with other diabetic neurological complication: Secondary | ICD-10-CM | POA: Diagnosis present

## 2020-11-24 DIAGNOSIS — Z20822 Contact with and (suspected) exposure to covid-19: Secondary | ICD-10-CM | POA: Diagnosis present

## 2020-11-24 DIAGNOSIS — Z6836 Body mass index (BMI) 36.0-36.9, adult: Secondary | ICD-10-CM | POA: Diagnosis not present

## 2020-11-24 DIAGNOSIS — I483 Typical atrial flutter: Secondary | ICD-10-CM | POA: Diagnosis not present

## 2020-11-24 DIAGNOSIS — R109 Unspecified abdominal pain: Secondary | ICD-10-CM | POA: Diagnosis not present

## 2020-11-24 DIAGNOSIS — K7689 Other specified diseases of liver: Secondary | ICD-10-CM | POA: Diagnosis present

## 2020-11-24 DIAGNOSIS — N179 Acute kidney failure, unspecified: Secondary | ICD-10-CM | POA: Diagnosis not present

## 2020-11-24 DIAGNOSIS — K56600 Partial intestinal obstruction, unspecified as to cause: Secondary | ICD-10-CM | POA: Diagnosis present

## 2020-11-24 DIAGNOSIS — I4892 Unspecified atrial flutter: Secondary | ICD-10-CM | POA: Diagnosis not present

## 2020-11-24 DIAGNOSIS — B9561 Methicillin susceptible Staphylococcus aureus infection as the cause of diseases classified elsewhere: Secondary | ICD-10-CM | POA: Diagnosis not present

## 2020-11-24 HISTORY — DX: Abdominal distension (gaseous): R14.0

## 2020-11-24 LAB — CBC WITH DIFFERENTIAL/PLATELET
Abs Immature Granulocytes: 0.13 10*3/uL — ABNORMAL HIGH (ref 0.00–0.07)
Basophils Absolute: 0 10*3/uL (ref 0.0–0.1)
Basophils Relative: 0 %
Eosinophils Absolute: 0.4 10*3/uL (ref 0.0–0.5)
Eosinophils Relative: 3 %
HCT: 36.9 % — ABNORMAL LOW (ref 39.0–52.0)
Hemoglobin: 12.4 g/dL — ABNORMAL LOW (ref 13.0–17.0)
Immature Granulocytes: 1 %
Lymphocytes Relative: 3 %
Lymphs Abs: 0.4 10*3/uL — ABNORMAL LOW (ref 0.7–4.0)
MCH: 32.3 pg (ref 26.0–34.0)
MCHC: 33.6 g/dL (ref 30.0–36.0)
MCV: 96.1 fL (ref 80.0–100.0)
Monocytes Absolute: 0.9 10*3/uL (ref 0.1–1.0)
Monocytes Relative: 8 %
Neutro Abs: 10.1 10*3/uL — ABNORMAL HIGH (ref 1.7–7.7)
Neutrophils Relative %: 85 %
Platelets: 143 10*3/uL — ABNORMAL LOW (ref 150–400)
RBC: 3.84 MIL/uL — ABNORMAL LOW (ref 4.22–5.81)
RDW: 12.8 % (ref 11.5–15.5)
WBC: 11.9 10*3/uL — ABNORMAL HIGH (ref 4.0–10.5)
nRBC: 0 % (ref 0.0–0.2)

## 2020-11-24 LAB — RESP PANEL BY RT-PCR (FLU A&B, COVID) ARPGX2
Influenza A by PCR: NEGATIVE
Influenza B by PCR: NEGATIVE
SARS Coronavirus 2 by RT PCR: NEGATIVE

## 2020-11-24 LAB — COMPREHENSIVE METABOLIC PANEL
ALT: 17 U/L (ref 0–44)
AST: 33 U/L (ref 15–41)
Albumin: 3.6 g/dL (ref 3.5–5.0)
Alkaline Phosphatase: 90 U/L (ref 38–126)
Anion gap: 9 (ref 5–15)
BUN: 16 mg/dL (ref 8–23)
CO2: 32 mmol/L (ref 22–32)
Calcium: 8.6 mg/dL — ABNORMAL LOW (ref 8.9–10.3)
Chloride: 89 mmol/L — ABNORMAL LOW (ref 98–111)
Creatinine, Ser: 0.78 mg/dL (ref 0.61–1.24)
GFR, Estimated: >60 mL/min (ref >60)
Glucose, Bld: 175 mg/dL — ABNORMAL HIGH (ref 70–99)
Potassium: 3.7 mmol/L (ref 3.5–5.1)
Sodium: 130 mmol/L — ABNORMAL LOW (ref 135–145)
Total Bilirubin: 0.6 mg/dL (ref 0.3–1.2)
Total Protein: 7 g/dL (ref 6.5–8.1)

## 2020-11-24 LAB — CBG MONITORING, ED
Glucose-Capillary: 172 mg/dL — ABNORMAL HIGH (ref 70–99)
Glucose-Capillary: 175 mg/dL — ABNORMAL HIGH (ref 70–99)

## 2020-11-24 LAB — LACTIC ACID, PLASMA
Lactic Acid, Venous: 2.5 mmol/L (ref 0.5–1.9)
Lactic Acid, Venous: 3.2 mmol/L (ref 0.5–1.9)
Lactic Acid, Venous: 2.6 mmol/L (ref 0.5–1.9)
Lactic Acid, Venous: 1.7 mmol/L (ref 0.5–1.9)

## 2020-11-24 LAB — GLUCOSE, CAPILLARY
Glucose-Capillary: 159 mg/dL — ABNORMAL HIGH (ref 70–99)
Glucose-Capillary: 171 mg/dL — ABNORMAL HIGH (ref 70–99)

## 2020-11-24 LAB — MAGNESIUM: Magnesium: 1.5 mg/dL — ABNORMAL LOW (ref 1.7–2.4)

## 2020-11-24 LAB — PROCALCITONIN: Procalcitonin: 1.14 ng/mL

## 2020-11-24 LAB — MRSA NEXT GEN BY PCR, NASAL: MRSA by PCR Next Gen: NOT DETECTED

## 2020-11-24 MED ORDER — ONDANSETRON HCL 4 MG PO TABS: 4.0000 mg | ORAL_TABLET | Freq: Four times a day (QID) | ORAL | Status: DC | PRN

## 2020-11-24 MED ORDER — LORAZEPAM 0.5 MG PO TABS
0.5000 mg | ORAL_TABLET | Freq: Three times a day (TID) | ORAL | Status: DC
  Administered 2020-11-24 – 2020-11-25 (×5): 0.5 mg via ORAL
  Filled 2020-11-24 (×5): qty 1

## 2020-11-24 MED ORDER — INSULIN ASPART 100 UNIT/ML IJ SOLN
0.0000 [IU] | Freq: Three times a day (TID) | INTRAMUSCULAR | Status: DC
  Administered 2020-11-24 (×3): 3 [IU] via SUBCUTANEOUS
  Administered 2020-11-25: 2 [IU] via SUBCUTANEOUS
  Administered 2020-11-25 (×2): 3 [IU] via SUBCUTANEOUS
  Administered 2020-11-26: 2 [IU] via SUBCUTANEOUS
  Administered 2020-11-26: 3 [IU] via SUBCUTANEOUS
  Filled 2020-11-24 (×2): qty 1

## 2020-11-24 MED ORDER — METOPROLOL SUCCINATE ER 25 MG PO TB24
25.0000 mg | ORAL_TABLET | Freq: Every day | ORAL | Status: DC
  Administered 2020-11-24: 25 mg via ORAL

## 2020-11-24 MED ORDER — LISINOPRIL-HYDROCHLOROTHIAZIDE 20-25 MG PO TABS: 1.0000 | ORAL_TABLET | Freq: Every day | ORAL | Status: DC

## 2020-11-24 MED ORDER — ALBUTEROL SULFATE (2.5 MG/3ML) 0.083% IN NEBU
2.5000 mg | INHALATION_SOLUTION | RESPIRATORY_TRACT | Status: DC | PRN
  Administered 2020-11-28: 2.5 mg via RESPIRATORY_TRACT
  Filled 2020-11-24: qty 3

## 2020-11-24 MED ORDER — HYDROMORPHONE HCL 1 MG/ML IJ SOLN
1.0000 mg | Freq: Once | INTRAMUSCULAR | Status: AC
  Administered 2020-11-24: 1 mg via INTRAVENOUS
  Filled 2020-11-24: qty 1

## 2020-11-24 MED ORDER — HYDROXYZINE HCL 25 MG PO TABS
25.0000 mg | ORAL_TABLET | Freq: Three times a day (TID) | ORAL | Status: DC | PRN
  Administered 2020-11-24 – 2020-12-05 (×7): 25 mg via ORAL
  Filled 2020-11-24 (×8): qty 1

## 2020-11-24 MED ORDER — ONDANSETRON HCL 4 MG/2ML IJ SOLN
4.0000 mg | Freq: Four times a day (QID) | INTRAMUSCULAR | Status: DC | PRN
  Administered 2020-11-24: 4 mg via INTRAVENOUS
  Filled 2020-11-24: qty 2

## 2020-11-24 MED ORDER — HYDROCHLOROTHIAZIDE 25 MG PO TABS: 25.0000 mg | ORAL_TABLET | Freq: Every day | ORAL | Status: DC

## 2020-11-24 MED ORDER — SODIUM CHLORIDE 0.9 % IV BOLUS
1000.0000 mL | Freq: Once | INTRAVENOUS | Status: AC
  Administered 2020-11-24: 1000 mL via INTRAVENOUS

## 2020-11-24 MED ORDER — ALBUTEROL SULFATE HFA 108 (90 BASE) MCG/ACT IN AERS: 2.0000 | INHALATION_SPRAY | Freq: Four times a day (QID) | RESPIRATORY_TRACT | Status: DC | PRN

## 2020-11-24 MED ORDER — PAROXETINE HCL 20 MG PO TABS
20.0000 mg | ORAL_TABLET | Freq: Every day | ORAL | Status: DC
  Administered 2020-11-24 – 2020-11-30 (×3): 20 mg via ORAL
  Filled 2020-11-24 (×6): qty 1

## 2020-11-24 MED ORDER — LACTATED RINGERS IV BOLUS
1000.0000 mL | Freq: Once | INTRAVENOUS | Status: AC
  Administered 2020-11-24: 1000 mL via INTRAVENOUS

## 2020-11-24 MED ORDER — MORPHINE SULFATE (PF) 2 MG/ML IV SOLN
2.0000 mg | INTRAVENOUS | Status: DC | PRN
  Administered 2020-11-24: 2 mg via INTRAVENOUS
  Filled 2020-11-24: qty 1

## 2020-11-24 MED ORDER — PIPERACILLIN-TAZOBACTAM 3.375 G IVPB 30 MIN
3.3750 g | Freq: Once | INTRAVENOUS | Status: AC
  Administered 2020-11-24: 3.375 g via INTRAVENOUS
  Filled 2020-11-24: qty 50

## 2020-11-24 MED ORDER — PAROXETINE HCL 20 MG PO TABS: 20.0000 mg | ORAL_TABLET | ORAL | Status: DC

## 2020-11-24 MED ORDER — ACETAMINOPHEN 325 MG PO TABS
650.0000 mg | ORAL_TABLET | Freq: Four times a day (QID) | ORAL | Status: DC | PRN
  Administered 2020-11-24 – 2020-11-25 (×3): 650 mg via ORAL
  Filled 2020-11-24 (×3): qty 2

## 2020-11-24 MED ORDER — ACETAMINOPHEN 650 MG RE SUPP: 650.0000 mg | Freq: Four times a day (QID) | RECTAL | Status: DC | PRN

## 2020-11-24 MED ORDER — CARBIDOPA-LEVODOPA ER 48.75-195 MG PO CPCR
1.0000 | ORAL_CAPSULE | Freq: Four times a day (QID) | ORAL | Status: DC
  Administered 2020-11-24 – 2020-12-02 (×21): 1 via ORAL
  Filled 2020-11-24 (×23): qty 1

## 2020-11-24 MED ORDER — OXYCODONE HCL 5 MG PO TABS
5.0000 mg | ORAL_TABLET | ORAL | Status: DC | PRN
  Administered 2020-11-24 – 2020-11-25 (×7): 5 mg via ORAL
  Filled 2020-11-24 (×7): qty 1

## 2020-11-24 MED ORDER — ATORVASTATIN CALCIUM 40 MG PO TABS
40.0000 mg | ORAL_TABLET | Freq: Every day | ORAL | Status: DC
  Administered 2020-11-24 – 2020-11-25 (×2): 40 mg via ORAL
  Filled 2020-11-24 (×2): qty 1

## 2020-11-24 MED ORDER — LISINOPRIL 10 MG PO TABS
20.0000 mg | ORAL_TABLET | Freq: Every day | ORAL | Status: DC
  Filled 2020-11-24: qty 2

## 2020-11-24 MED ORDER — PIPERACILLIN-TAZOBACTAM 3.375 G IVPB
3.3750 g | Freq: Three times a day (TID) | INTRAVENOUS | Status: DC
  Administered 2020-11-24 – 2020-11-25 (×3): 3.375 g via INTRAVENOUS
  Filled 2020-11-24 (×3): qty 50

## 2020-11-24 MED ORDER — PAROXETINE HCL 20 MG PO TABS
40.0000 mg | ORAL_TABLET | Freq: Every day | ORAL | Status: DC
  Administered 2020-11-24 – 2020-12-01 (×6): 40 mg via ORAL
  Filled 2020-11-24 (×6): qty 2

## 2020-11-24 MED ORDER — INSULIN ASPART 100 UNIT/ML IJ SOLN
0.0000 [IU] | Freq: Every day | INTRAMUSCULAR | Status: DC
  Administered 2020-11-24: 3 [IU] via SUBCUTANEOUS

## 2020-11-24 MED ORDER — LORATADINE 10 MG PO TABS
10.0000 mg | ORAL_TABLET | Freq: Every day | ORAL | Status: DC
  Administered 2020-11-24 – 2020-12-07 (×7): 10 mg via ORAL
  Filled 2020-11-24 (×9): qty 1

## 2020-11-24 MED ORDER — ASPIRIN 81 MG PO CHEW
81.0000 mg | CHEWABLE_TABLET | Freq: Every day | ORAL | Status: DC
  Administered 2020-11-24 – 2020-12-07 (×12): 81 mg via ORAL
  Filled 2020-11-24 (×12): qty 1

## 2020-11-24 MED ORDER — HEPARIN SODIUM (PORCINE) 5000 UNIT/ML IJ SOLN
5000.0000 [IU] | Freq: Three times a day (TID) | INTRAMUSCULAR | Status: DC
  Administered 2020-11-24 – 2020-12-07 (×39): 5000 [IU] via SUBCUTANEOUS
  Filled 2020-11-24 (×40): qty 1

## 2020-11-24 MED ORDER — MORPHINE SULFATE (PF) 2 MG/ML IV SOLN
1.0000 mg | INTRAVENOUS | Status: DC | PRN
Start: 2020-11-24 — End: 2020-11-25
  Administered 2020-11-24 – 2020-11-25 (×6): 1 mg via INTRAVENOUS
  Filled 2020-11-24 (×6): qty 1

## 2020-11-24 MED ORDER — MAGNESIUM SULFATE 4 GM/100ML IV SOLN
4.0000 g | Freq: Once | INTRAVENOUS | Status: AC
  Administered 2020-11-24: 4 g via INTRAVENOUS
  Filled 2020-11-24: qty 100

## 2020-11-24 MED ORDER — IOHEXOL 350 MG/ML SOLN
75.0000 mL | Freq: Once | INTRAVENOUS | Status: AC | PRN
  Administered 2020-11-24: 75 mL via INTRAVENOUS

## 2020-11-24 MED ORDER — PHENYTOIN SODIUM EXTENDED 100 MG PO CAPS
100.0000 mg | ORAL_CAPSULE | Freq: Three times a day (TID) | ORAL | Status: DC
  Administered 2020-11-24 – 2020-11-25 (×5): 100 mg via ORAL
  Filled 2020-11-24 (×5): qty 1

## 2020-11-24 MED ORDER — PANTOPRAZOLE SODIUM 40 MG PO TBEC
40.0000 mg | DELAYED_RELEASE_TABLET | Freq: Every day | ORAL | Status: DC
  Administered 2020-11-24 – 2020-11-25 (×2): 40 mg via ORAL
  Filled 2020-11-24 (×2): qty 1

## 2020-11-24 MED ORDER — CHLORHEXIDINE GLUCONATE CLOTH 2 % EX PADS
6.0000 | MEDICATED_PAD | Freq: Every day | CUTANEOUS | Status: DC
  Administered 2020-11-24 – 2020-12-07 (×13): 6 via TOPICAL

## 2020-11-24 NOTE — ED Notes (Signed)
Hospitalist at bedside 

## 2020-11-24 NOTE — Progress Notes (Signed)
Pharmacy Antibiotic Note  Danny Hampton is a 65 y.o. adult admitted on 11/23/2020 with  intra-abdominal infection .  Pharmacy has been consulted for zosyn dosing.  Plan: Zosyn 3.375g IV q8h (4 hour infusion). F/U cxs and clinical progress Monitor V/S, labs  Height: '5\' 4"'$  (162.6 cm) Weight: 90.7 kg (200 lb) IBW/kg (Calculated) : 59.2  Temp (24hrs), Avg:98.1 F (36.7 C), Min:98.1 F (36.7 C), Max:98.1 F (36.7 C)  Recent Labs  Lab 11/23/20 2003 11/24/20 0546  WBC 14.9* 11.9*  CREATININE 0.90 0.78  LATICACIDVEN  --  2.5*    Estimated Creatinine Clearance (by C-G formula based on SCr of 0.78 mg/dL) Male: 76.5 mL/min Male: 93.5 mL/min    No Known Allergies  Antimicrobials this admission: zosyn 9/17 >>   Microbiology results: 9/17 BCx: pending  Thank you for allowing pharmacy to be a part of this patient's care.  Isac Sarna, BS Pharm D, [place] Clinical Pharmacist Pager [phone] 11/24/2020 7:56 AM

## 2020-11-24 NOTE — ED Notes (Signed)
Patient ambulating with PT.

## 2020-11-24 NOTE — Evaluation (Signed)
Physical Therapy Evaluation Patient Details Name: Danny Hampton MRN: [id number] DOB: 10-06-1955 Today's Date: 11/24/2020  History of Present Illness  Danny Hampton  is a 65 y.o. adult, with history of acid reflux, dysrhythmia, essential hypertension, high cholesterol, myotonia congenita, type 2 diabetes mellitus presents to the ED with a chief complaint of abdominal pain.  Patient reports that he feels like there is a blockage in his bowels.  He reports abdominal distention and a pain that he describes as pressure.  He reports that he had a back surgery in August 24, the pain started shortly after that.  He reports that after previous back surgery he developed an ileus, and he thought that is what was going on this time.  Patient was told that if he was producing flatulence, not to worry about an ileus.  He reports he has been producing flatulence.  Patient was seen yesterday at [facility], and sent home with a container of GoLytely.  Patient reports he drank the whole thing, but still feels like there is something wrong.  The pain is mostly in the right upper quadrant and the lower ribs.  He reports it is worse when he takes a deep breath.  He reports that he tries not to cough because that is painful.  Patient is not on any oxygen at baseline, and is requiring oxygen in the ER today 2 L nasal cannula.  Patient has no history of blood clot.  Patient reports that he has had multiple loose stools today as expected after GoLytely.  His last normal bowel movement was 2 days ago.  He reports that his last normal meal was 3 days ago.  He has not been vomiting.   Clinical Impression  Patient functioning near baseline for functional mobility and gait other than requiring increased time and Min guard/Min assist for rolling to side and sitting up from side lying position.  Patient demonstrates slow slightly labored cadence during ambulation in hallway without loss of balance, on room air with SpO2 dropped  from 92% to 80% on room air and put back on 2 LPM O2 when put back to bed - RN notified.  Patient will benefit from continued physical therapy in hospital and recommended venue below to increase strength, balance, endurance for safe ADLs and gait.         Recommendations for follow up therapy are one component of a multi-disciplinary discharge planning process, led by the attending physician.  Recommendations may be updated based on patient status, additional functional criteria and insurance authorization.  Follow Up Recommendations Home health PT;Supervision for mobility/OOB;Supervision - Intermittent    Equipment Recommendations  None recommended by PT    Recommendations for Other Services       Precautions / Restrictions Precautions Precautions: Fall Restrictions Weight Bearing Restrictions: No      Mobility  Bed Mobility Overal bed mobility: Needs Assistance Bed Mobility: Rolling;Sidelying to Sit;Sit to Sidelying Rolling: Min guard Sidelying to sit: Min guard;Min assist     Sit to sidelying: Min guard;Min assist General bed mobility comments: increased time, labored movement    Transfers Overall transfer level: Needs assistance Equipment used: Rolling walker (2 wheeled) Transfers: Sit to/from [facility] Sit to Stand: Supervision Stand pivot transfers: Supervision          Ambulation/Gait Ambulation/Gait assistance: Supervision Gait Distance (Feet): 100 Feet Assistive device: Rolling walker (2 wheeled) Gait Pattern/deviations: Decreased step length - right;Decreased step length - left;Decreased stride length Gait velocity: Decreased  General Gait Details: slow labored cadence without loss of balance, limited mostly due to c/o low back pain  Stairs            Wheelchair Mobility    Modified Rankin (Stroke Patients Only)       Balance Overall balance assessment: Needs assistance Sitting-balance support: Feet supported;No upper  extremity supported Sitting balance-Leahy Scale: Good Sitting balance - Comments: seated at EOB   Standing balance support: During functional activity;Bilateral upper extremity supported Standing balance-Leahy Scale: Fair Standing balance comment: using RW                             Pertinent Vitals/Pain Pain Assessment: Faces Faces Pain Scale: Hurts whole lot Pain Location: low back mostly, some in stomach Pain Descriptors / Indicators: Grimacing;Guarding;Sore Pain Intervention(s): Limited activity within patient's tolerance;Monitored during session;Patient requesting pain meds-RN notified    Home Living Family/patient expects to be discharged to:: Private residence Living Arrangements: Spouse/significant other Available Help at Discharge: Family;Available 24 hours/day Type of Home: House Home Access: Level entry     Home Layout: One level Home Equipment: Walker - 2 wheels;Cane - single point;Grab bars - toilet;Grab bars - tub/shower      Prior Function Level of Independence: Independent with assistive device(s)         Comments: household and short distanced community ambulator, occasionally drives     Hand Dominance   Dominant Hand: Right    Extremity/Trunk Assessment   Upper Extremity Assessment Upper Extremity Assessment: Overall WFL for tasks assessed    Lower Extremity Assessment Lower Extremity Assessment: Generalized weakness    Cervical / Trunk Assessment Cervical / Trunk Assessment: Normal  Communication   Communication: No difficulties  Cognition Arousal/Alertness: Awake/alert Behavior During Therapy: WFL for tasks assessed/performed Overall Cognitive Status: Within Functional Limits for tasks assessed                                        General Comments      Exercises     Assessment/Plan    PT Assessment Patient needs continued PT services  PT Problem List Decreased strength;Decreased activity  tolerance;Decreased balance;Decreased mobility;Pain       PT Treatment Interventions DME instruction;Gait training;Stair training;Functional mobility training;Therapeutic activities;Therapeutic exercise;Balance training;Patient/family education    PT Goals (Current goals can be found in the Care Plan section)  Acute Rehab PT Goals Patient Stated Goal: return home with family to assist PT Goal Formulation: With patient Time For Goal Achievement: 11/27/20 Potential to Achieve Goals: Good    Frequency Min 3X/week   Barriers to discharge        Co-evaluation               AM-PAC PT "6 Clicks" Mobility  Outcome Measure Help needed turning from your back to your side while in a flat bed without using bedrails?: A Little Help needed moving from lying on your back to sitting on the side of a flat bed without using bedrails?: A Little Help needed moving to and from a bed to a chair (including a wheelchair)?: A Little Help needed standing up from a chair using your arms (e.g., wheelchair or bedside chair)?: None Help needed to walk in hospital room?: A Little Help needed climbing 3-5 steps with a railing? : A Little 6 Click Score: 19    End  of Session   Activity Tolerance: Patient tolerated treatment well;Patient limited by fatigue;Patient limited by pain Patient left: in bed;with call bell/phone within reach Nurse Communication: Mobility status PT Visit Diagnosis: Unsteadiness on feet (R26.81);Other abnormalities of gait and mobility (R26.89);Muscle weakness (generalized) (M62.81) Pain - part of body:  (low back and stomach)    Time: [id number] PT Time Calculation (min) (ACUTE ONLY): 31 min   Charges:   PT Evaluation $PT Eval Moderate Complexity: 1 Mod PT Treatments $Therapeutic Activity: 23-37 mins        1:03 PM, 11/24/20 Lonell Grandchild, MPT Physical Therapist with [facility] [phone] office [phone] mobile phone

## 2020-11-24 NOTE — Plan of Care (Signed)
  Problem: Acute Rehab PT Goals(only PT should resolve) Goal: Pt Will Go Supine/Side To Sit Outcome: Progressing Flowsheets (Taken 11/24/2020 1305) Pt will go Supine/Side to Sit:  with modified independence  with supervision Goal: Patient Will Transfer Sit To/From Stand Outcome: Progressing Flowsheets (Taken 11/24/2020 1305) Patient will transfer sit to/from stand: with modified independence Goal: Pt Will Transfer Bed To Chair/Chair To Bed Outcome: Progressing Flowsheets (Taken 11/24/2020 1305) Pt will Transfer Bed to Chair/Chair to Bed: with modified independence Goal: Pt Will Ambulate Outcome: Progressing Flowsheets (Taken 11/24/2020 1305) Pt will Ambulate:  > 125 feet  with modified independence  with rolling walker   1:05 PM, 11/24/20 Lonell Grandchild, MPT Physical Therapist with [facility] [phone] office [phone] mobile phone

## 2020-11-24 NOTE — ED Notes (Signed)
Pharmacy notified that carbidopa-levodopa brought by wife

## 2020-11-24 NOTE — H&P (Signed)
TRH H&P    Patient Demographics:    Danny Hampton, is a 65 y.o. adult  MRN: [id number]  DOB - [birth year]  Admit Date - 11/23/2020  Referring MD/NP/PA: Rogene Houston  Outpatient Primary MD for the patient is [facility] [place]  Patient coming from: Home  Chief complaint-abdominal pain   HPI:    Danny Hampton  is a 65 y.o. adult, with history of acid reflux, dysrhythmia, essential hypertension, high cholesterol, myotonia congenita, type 2 diabetes mellitus presents to the ED with a chief complaint of abdominal pain.  Patient reports that he feels like there is a blockage in his bowels.  He reports abdominal distention and a pain that he describes as pressure.  He reports that he had a back surgery in August 24, the pain started shortly after that.  He reports that after previous back surgery he developed an ileus, and he thought that is what was going on this time.  Patient was told that if he was producing flatulence, not to worry about an ileus.  He reports he has been producing flatulence.  Patient was seen yesterday at [facility], and sent home with a container of GoLytely.  Patient reports he drank the whole thing, but still feels like there is something wrong.  The pain is mostly in the right upper quadrant and the lower ribs.  He reports it is worse when he takes a deep breath.  He reports that he tries not to cough because that is painful.  Patient is not on any oxygen at baseline, and is requiring oxygen in the ER today 2 L nasal cannula.  Patient has no history of blood clot.  Patient reports that he has had multiple loose stools today as expected after GoLytely.  His last normal bowel movement was 2 days ago.  He reports that his last normal meal was 3 days ago.  He has not been vomiting.  Patient does not smoke, does not drink alcohol, does not use illicit drugs.  He is vaccinated for COVID.  Patient is  full code.  In the ED Temp 98, heart rate 127-146, respiratory 18-30, blood pressure 112/66, satting 95% on 2 L nasal cannula White blood cell count of 14.9, hemoglobin 12.4 Chemistry panel is unremarkable CT shows ileus/enteritis.  Partial small bowel obstruction not excluded.  Air-fluid levels in the colon without a definite transition point. EKG shows a heart rate of 140, sinus tach, QTC 500 CTA ordered at admission does not show evidence of a pulmonary embolism but does show atelectasis Patient required 3 doses of Dilaudid in the ED He was given metoprolol for tachycardia that only minimally reduced his heart rate Admission was requested for further work-up of intractable abdominal pain     Review of systems:    In addition to the HPI above,  No Fever-chills, No Headache, No changes with Vision or hearing, No problems swallowing food or Liquids, No Chest pain, Cough admits to shortness of breath  Admits to abdominal pain, No Nausea or Vomiting, feels constipated No Blood  in stool or Urine, No dysuria, No new skin rashes or bruises, No new joints pains-aches,  No new weakness, tingling, numbness in any extremity, No recent weight gain or loss, No polyuria, polydypsia or polyphagia, No significant Mental Stressors.  All other systems reviewed and are negative.    Past History of the following :    Past Medical History:  Diagnosis Date   Acid reflux    Anxiety    Arrhythmia    paroxysmal supreventricular tachycardia   Arthritis    Complication of anesthesia    Myotonia congenita   Dysrhythmia    Essential hypertension    Family history of adverse reaction to anesthesia    Sister   High cholesterol    History of skin cancer    Myotonia congenita    Type 2 diabetes mellitus ([place])       Past Surgical History:  Procedure Laterality Date   BACK SURGERY     times 2   CARPAL TUNNEL RELEASE     CATARACT EXTRACTION W/PHACO Left 01/31/2019   Procedure: CATARACT  EXTRACTION PHACO AND INTRAOCULAR LENS PLACEMENT (IOC);  Surgeon: Baruch Goldmann, MD;  Location: [facility];  Service: Ophthalmology;  Laterality: Left;  CDE: 2.66   NOSE SURGERY     TOTAL KNEE ARTHROPLASTY Right 10/12/2018   Procedure: RIGHT TOTAL KNEE ARTHROPLASTY;  Surgeon: Garald Balding, MD;  Location: [facility];  Service: Orthopedics;  Laterality: Right;      Social History:      Social History   Tobacco Use   Smoking status: Former    Packs/day: 1.00    Years: 30.00    Pack years: 30.00    Types: Cigarettes    Quit date: 03/2018    Years since quitting: 2.7   Smokeless tobacco: Never  Substance Use Topics   Alcohol use: Not Currently    Comment: occasional drink       Family History :     Family History  Problem Relation Age of Onset   Healthy Mother    Healthy Father    Cancer Father       Home Medications:   Prior to Admission medications   Medication Sig Start Date End Date Taking? Authorizing Provider  aspirin 81 MG chewable tablet Chew 1 tablet (81 mg total) by mouth daily. 04/26/20  Yes Barton Dubois, MD  atorvastatin (LIPITOR) 80 MG tablet Take 40 mg by mouth daily.    Yes [provider]  Carbidopa-Levodopa ER 48.75-195 MG CPCR Take 1 tablet by mouth 4 (four) times daily.   Yes [provider]  Cholecalciferol (VITAMIN D3) 125 MCG (5000 UT) TABS Take 5,000 Units by mouth daily.    Yes [provider]  cyclobenzaprine (FLEXERIL) 10 MG tablet Take 1 tablet (10 mg total) by mouth 3 (three) times daily as needed for muscle spasms. 11/03/20  Yes Judith Part, MD  docusate sodium (COLACE) 100 MG capsule Take 100 mg by mouth daily.   Yes [provider]  hydrOXYzine (ATARAX/VISTARIL) 25 MG tablet Take 25 mg by mouth in the morning, at noon, in the evening, and at bedtime.   Yes [provider]  lisinopril-hydrochlorothiazide (ZESTORETIC) 20-25 MG tablet Take 1 tablet by mouth daily.   Yes [provider]   loratadine (CLARITIN) 10 MG tablet Take 10 mg by mouth daily.   Yes [provider]  LORazepam (ATIVAN) 1 MG tablet Take 1.5 tablets by mouth in the morning, at noon, and at  bedtime. 07/18/20  Yes [provider]  meloxicam (MOBIC) 15 MG tablet Take 15 mg by mouth daily.   Yes [provider]  metFORMIN (GLUCOPHAGE) 500 MG tablet Take 500 mg by mouth 2 (two) times daily with a meal.    Yes [provider]  metoprolol succinate (TOPROL XL) 25 MG 24 hr tablet Take 1 tablet (25 mg total) by mouth daily. 02/01/19  Yes Satira Sark, MD  Multiple Vitamin (MULTI-VITAMIN) tablet Take 1 tablet by mouth daily.   Yes [provider]  pantoprazole (PROTONIX) 40 MG tablet Take 40 mg by mouth daily.   Yes [provider]  PARoxetine (PAXIL) 40 MG tablet Take 20-40 mg by mouth See admin instructions. Take 40 mg by mouth in the morning and 20 mg in the evening   Yes [provider]  phenytoin (DILANTIN) 100 MG ER capsule Take 100 mg by mouth 3 (three) times daily.   Yes [provider]  rOPINIRole (REQUIP) 1 MG tablet Take 0.'5mg'$  at 6pm and '1mg'$  at 9pm. Patient taking differently: Take 0.5 mg by mouth See admin instructions. Take 0.5 mg by mouth every evening and 6 pm and again at 9 pm 03/21/19  Yes Patel, Donika K, DO  sodium chloride (MURO 128) 5 % ophthalmic solution Place 1 drop into the right eye every 3 (three) hours as needed for eye irritation.   Yes [provider]  traMADol (ULTRAM) 50 MG tablet Take by mouth. 11/23/20 11/28/20 Yes [provider]  zinc sulfate 220 (50 Zn) MG capsule Take 1 capsule (220 mg total) by mouth daily. 04/26/20  Yes Barton Dubois, MD  albuterol (VENTOLIN HFA) 108 (90 Base) MCG/ACT inhaler Inhale 2 puffs into the lungs every 6 (six) hours as needed for wheezing or shortness of breath. Patient not taking: Reported on 11/23/2020 04/26/20   Barton Dubois, MD  ascorbic acid (VITAMIN C) 500 MG  tablet Take 1 tablet (500 mg total) by mouth daily. Patient not taking: Reported on 10/16/2020 04/26/20   Barton Dubois, MD  guaiFENesin-dextromethorphan [facility] DM) 100-10 MG/5ML syrup Take 10 mLs by mouth every 6 (six) hours as needed for cough. Patient not taking: Reported on 11/23/2020 04/26/20   Barton Dubois, MD  methocarbamol (ROBAXIN) 500 MG tablet Take by mouth. Patient not taking: No sig reported 11/23/20 12/03/20  [provider]  oxyCODONE (OXY IR/ROXICODONE) 5 MG immediate release tablet Take 1 tablet (5 mg total) by mouth every 3 (three) hours as needed (pain). Patient not taking: No sig reported 11/03/20   Judith Part, MD     Allergies:    No Known Allergies   Physical Exam:   Vitals  Blood pressure 126/77, pulse (!) 120, temperature 98.1 F (36.7 C), temperature source Oral, resp. rate 18, height '5\' 4"'$  (1.626 m), weight 90.7 kg, SpO2 95 %.  1.  General: Patient lying supine in bed, appears to be in some pain, with moaning but no repositioning   2. Psychiatric: Alert and oriented x 3, mood and behavior normal for situation, pleasant and cooperative with exam   3. Neurologic: Speech and language are normal, face is symmetric, moves all 4 extremities voluntarily, at baseline without acute deficits on limited exam   4. HEENMT:  Head is atraumatic, normocephalic, pupils reactive to light, neck is supple, trachea is midline, mucous membranes are moist   5. Respiratory : Lungs are clear to auscultation bilaterally without wheezing, rhonchi, rales, no cyanosis, no increase in work of breathing  or accessory muscle use   6. Cardiovascular : Heart rate normal, rhythm is regular, no murmurs, rubs or gallops, no peripheral edema, peripheral pulses palpated   7. Gastrointestinal:  Abdomen is soft, taut but not rigid, distended, tender to palpation diffusely bowel sounds active, no masses or organomegaly palpated   8. Skin:  Skin is warm, dry and intact  without rashes, acute lesions, or ulcers on limited exam   9.Musculoskeletal:  No acute deformities or trauma, no asymmetry in tone, no peripheral edema, peripheral pulses palpated, no tenderness to palpation in the extremities     Data Review:    CBC Recent Labs  Lab 11/23/20 2003  WBC 14.9*  HGB 12.4*  HCT 36.3*  PLT 196  MCV 93.8  MCH 32.0  MCHC 34.2  RDW 12.6  LYMPHSABS 0.4*  MONOABS 1.0  EOSABS 0.0  BASOSABS 0.0   ------------------------------------------------------------------------------------------------------------------  Results for orders placed or performed during the hospital encounter of 11/23/20 (from the past 48 hour(s))  CBC with Differential/Platelet     Status: Abnormal   Collection Time: 11/23/20  8:03 PM  Result Value Ref Range   WBC 14.9 (H) 4.0 - 10.5 K/uL   RBC 3.87 (L) 4.22 - 5.81 MIL/uL   Hemoglobin 12.4 (L) 13.0 - 17.0 g/dL   HCT 36.3 (L) 39.0 - 52.0 %   MCV 93.8 80.0 - 100.0 fL   MCH 32.0 26.0 - 34.0 pg   MCHC 34.2 30.0 - 36.0 g/dL   RDW 12.6 11.5 - 15.5 %   Platelets 196 150 - 400 K/uL   nRBC 0.0 0.0 - 0.2 %   Neutrophils Relative % 89 %   Neutro Abs 13.3 (H) 1.7 - 7.7 K/uL   Lymphocytes Relative 3 %   Lymphs Abs 0.4 (L) 0.7 - 4.0 K/uL   Monocytes Relative 7 %   Monocytes Absolute 1.0 0.1 - 1.0 K/uL   Eosinophils Relative 0 %   Eosinophils Absolute 0.0 0.0 - 0.5 K/uL   Basophils Relative 0 %   Basophils Absolute 0.0 0.0 - 0.1 K/uL   Immature Granulocytes 1 %   Abs Immature Granulocytes 0.14 (H) 0.00 - 0.07 K/uL    Comment: Performed at [facility] [place], [address] [place], [place] [address]  Comprehensive metabolic panel     Status: Abnormal   Collection Time: 11/23/20  8:03 PM  Result Value Ref Range   Sodium 130 (L) 135 - 145 mmol/L   Potassium 3.8 3.5 - 5.1 mmol/L   Chloride 88 (L) 98 - 111 mmol/L   CO2 27 22 - 32 mmol/L   Glucose, Bld 184 (H) 70 - 99 mg/dL    Comment: Glucose reference range applies only to samples  taken after fasting for at least 8 hours.   BUN 19 8 - 23 mg/dL   Creatinine, Ser 0.90 0.61 - 1.24 mg/dL   Calcium 8.9 8.9 - 10.3 mg/dL   Total Protein 7.6 6.5 - 8.1 g/dL   Albumin 4.1 3.5 - 5.0 g/dL   AST 33 15 - 41 U/L   ALT 20 0 - 44 U/L   Alkaline Phosphatase 100 38 - 126 U/L   Total Bilirubin 0.6 0.3 - 1.2 mg/dL   GFR, Estimated >60 >60 mL/min    Comment: (NOTE) Calculated using the CKD-EPI Creatinine Equation (2021)    Anion gap 15 5 - 15    Comment: Performed at [facility], [address], [place],  [address]  Resp Panel by RT-PCR (Flu  A&B, Covid) Nasopharyngeal Swab     Status: None   Collection Time: 11/24/20 12:40 AM   Specimen: Nasopharyngeal Swab; Nasopharyngeal(NP) swabs in vial transport medium  Result Value Ref Range   SARS Coronavirus 2 by RT PCR NEGATIVE NEGATIVE    Comment: (NOTE) SARS-CoV-2 target nucleic acids are NOT DETECTED.  The SARS-CoV-2 RNA is generally detectable in upper respiratory specimens during the acute phase of infection. The lowest concentration of SARS-CoV-2 viral copies this assay can detect is 138 copies/mL. A negative result does not preclude SARS-Cov-2 infection and should not be used as the sole basis for treatment or other patient management decisions. A negative result may occur with  improper specimen collection/handling, submission of specimen other than nasopharyngeal swab, presence of viral mutation(s) within the areas targeted by this assay, and inadequate number of viral copies(<138 copies/mL). A negative result must be combined with clinical observations, patient history, and epidemiological information. The expected result is Negative.  Fact Sheet for Patients:  EntrepreneurPulse.com.au  Fact Sheet for Healthcare Providers:  IncredibleEmployment.be  This test is no t yet approved or cleared by the [place] FDA and  has been authorized for detection and/or diagnosis of  SARS-CoV-2 by FDA under an Emergency Use Authorization (EUA). This EUA will remain  in effect (meaning this test can be used) for the duration of the COVID-19 declaration under Section 564(b)(1) of the Act, 21 U.S.C.section 360bbb-3(b)(1), unless the authorization is terminated  or revoked sooner.       Influenza A by PCR NEGATIVE NEGATIVE   Influenza B by PCR NEGATIVE NEGATIVE    Comment: (NOTE) The Xpert Xpress SARS-CoV-2/FLU/RSV plus assay is intended as an aid in the diagnosis of influenza from Nasopharyngeal swab specimens and should not be used as a sole basis for treatment. Nasal washings and aspirates are unacceptable for Xpert Xpress SARS-CoV-2/FLU/RSV testing.  Fact Sheet for Patients: EntrepreneurPulse.com.au  Fact Sheet for Healthcare Providers: IncredibleEmployment.be  This test is not yet approved or cleared by the [place] FDA and has been authorized for detection and/or diagnosis of SARS-CoV-2 by FDA under an Emergency Use Authorization (EUA). This EUA will remain in effect (meaning this test can be used) for the duration of the COVID-19 declaration under Section 564(b)(1) of the Act, 21 U.S.C. section 360bbb-3(b)(1), unless the authorization is terminated or revoked.  Performed at [facility], [address] [place], [place] [address]     Chemistries  Recent Labs  Lab 11/23/20 2003  NA 130*  K 3.8  CL 88*  CO2 27  GLUCOSE 184*  BUN 19  CREATININE 0.90  CALCIUM 8.9  AST 33  ALT 20  ALKPHOS 100  BILITOT 0.6   ------------------------------------------------------------------------------------------------------------------  ------------------------------------------------------------------------------------------------------------------ GFR: Estimated Creatinine Clearance (by C-G formula based on SCr of 0.9 mg/dL) Male: 68 mL/min Male: 83.1 mL/min Liver Function Tests: Recent Labs  Lab  11/23/20 2003  AST 33  ALT 20  ALKPHOS 100  BILITOT 0.6  PROT 7.6  ALBUMIN 4.1   No results for input(s): LIPASE, AMYLASE in the last 168 hours. No results for input(s): AMMONIA in the last 168 hours. Coagulation Profile: No results for input(s): INR, PROTIME in the last 168 hours. Cardiac Enzymes: No results for input(s): CKTOTAL, CKMB, CKMBINDEX, TROPONINI in the last 168 hours. BNP (last 3 results) No results for input(s): PROBNP in the last 8760 hours. HbA1C: No results for input(s): HGBA1C in the last 72 hours. CBG: No results for input(s): GLUCAP in the last 168 hours. Lipid  Profile: No results for input(s): CHOL, HDL, LDLCALC, TRIG, CHOLHDL, LDLDIRECT in the last 72 hours. Thyroid Function Tests: No results for input(s): TSH, T4TOTAL, FREET4, T3FREE, THYROIDAB in the last 72 hours. Anemia Panel: No results for input(s): VITAMINB12, FOLATE, FERRITIN, TIBC, IRON, RETICCTPCT in the last 72 hours.  --------------------------------------------------------------------------------------------------------------- Urine analysis:    Component Value Date/Time   COLORURINE YELLOW 10/07/2018 0923   APPEARANCEUR HAZY (A) 10/07/2018 0923   LABSPEC 1.005 10/07/2018 0923   PHURINE 7.0 10/07/2018 0923   GLUCOSEU NEGATIVE 10/07/2018 0923   HGBUR NEGATIVE 10/07/2018 0923   BILIRUBINUR NEGATIVE 10/07/2018 0923   KETONESUR NEGATIVE 10/07/2018 0923   PROTEINUR NEGATIVE 10/07/2018 0923   NITRITE NEGATIVE 10/07/2018 0923   LEUKOCYTESUR NEGATIVE 10/07/2018 [id number]      Imaging Results:    CT Angio Chest Pulmonary Embolism (PE) W or WO Contrast  Result Date: 11/24/2020 CLINICAL DATA:  Shortness of breath. EXAM: CT ANGIOGRAPHY CHEST WITH CONTRAST TECHNIQUE: Multidetector CT imaging of the chest was performed using the standard protocol during bolus administration of intravenous contrast. Multiplanar CT image reconstructions and MIPs were obtained to evaluate the vascular anatomy. CONTRAST:   57m OMNIPAQUE IOHEXOL 350 MG/ML SOLN COMPARISON:  November 22, 2020 FINDINGS: Cardiovascular: There is mild calcification of the aortic arch without evidence of aortic aneurysm or dissection. Satisfactory opacification of the pulmonary arteries to the segmental level. No evidence of pulmonary embolism. Normal heart size. No pericardial effusion. Mediastinum/Nodes: No enlarged mediastinal, hilar, or axillary lymph nodes. Thyroid gland, trachea, and esophagus demonstrate no significant findings. Lungs/Pleura: Mild atelectasis is seen within the bilateral lower lobes and anterior aspect of the left upper lobe. There is no evidence of a pleural effusion or pneumothorax. Upper Abdomen: No acute abnormality. Musculoskeletal: Bilateral metallic density pedicle screws are seen within the lower thoracic spine. Review of the MIP images confirms the above findings. IMPRESSION: 1. No evidence of pulmonary embolism. 2. Mild bilateral lower lobe and left upper lobe atelectasis. 3. Aortic atherosclerosis. Aortic Atherosclerosis (ICD10-I70.0). Electronically Signed   By: TVirgina NorfolkM.D.   On: 11/24/2020 03:51   CT Abdomen Pelvis W Contrast  Result Date: 11/23/2020 CLINICAL DATA:  Abdominal pain and abdominal distension. EXAM: CT ABDOMEN AND PELVIS WITH CONTRAST TECHNIQUE: Multidetector CT imaging of the abdomen and pelvis was performed using the standard protocol following bolus administration of intravenous contrast. CONTRAST:  716mOMNIPAQUE IOHEXOL 350 MG/ML SOLN COMPARISON:  None. FINDINGS: Lower chest: There is atelectasis in the lung bases. Coronary artery calcifications are present. Hepatobiliary: There is a rounded hypodensity in the left lobe of the liver which is too small to characterize, most likely a small cyst or hemangioma. There is no biliary ductal dilatation. There are no gallstones identified. Pancreas: Unremarkable. No pancreatic ductal dilatation or surrounding inflammatory changes. Spleen: Normal  in size without focal abnormality. Adrenals/Urinary Tract: The bilateral adrenal glands are within normal limits. There is no hydronephrosis or perinephric fluid. There is a rounded hypodensity in the right renal cortex which is too small to characterize, likely a cyst. 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 The bladder is distended with contrast and within normal limits. Stomach/Bowel: There is no free air. Proximal colon is distended with air-fluid levels. The distal colon is decompressed. There is no definitive transition point visualized. There is sigmoid colon diverticulosis without evidence for acute diverticulitis. Additionally, small bowel loops are distended with air-fluid levels measuring up to 3.2 cm. No definite transition point visualized. The stomach appears  within normal limits. Appendix is within normal limits. Vascular/Lymphatic: Aortic atherosclerosis. No enlarged abdominal or pelvic lymph nodes. Reproductive: Prostate is unremarkable. Other: There are small fat containing bilateral inguinal hernias. There is also small fat containing umbilical hernia. There is no ascites. Musculoskeletal: T10-S1 posterior fusion hardware is present. Vertebroplasty changes are seen at T12 and L1. IMPRESSION: 1. Mildly dilated small bowel with air-fluid levels. Findings are concerning for ileus/enteritis. No definite transition point visualized. Partial small bowel obstruction is not excluded. 2. Air-fluid levels distend the proximal colon and the distal colon is relatively decompressed. No definite transition point visualized. Findings can be seen with colonic ileus and/or diarrhea. 3. Sigmoid colon diverticulosis without evidence for diverticulitis. Electronically Signed   By: Ronney Asters M.D.   On: 11/23/2020 22:54    My personal review of EKG: Rhythm sinus tachycardia 140, QTc 500   Assessment & Plan:    Active Problems:   Acute respiratory failure with hypoxia  (HCC)   GERD (gastroesophageal reflux disease)   Myotonia congenita   Hyperlipidemia   Intractable abdominal pain   Intractable abdominal pain Ongoing for 3 weeks Associated with abdominal distention, tenderness to palpation Slight leukocytosis of 14.9, no infectious etiology on CT, no definite diagnosis seen on CT Patient believed to be constipated, took GoLytely, CT does not show constipation Control pain with pain scale Tylenol, Aloxi, morphine Consult to general surgery for possible ileus, distention, abdominal tenderness Lactic acid and procalcitonin pending Acute respiratory failure with hypoxia Patient is new requirement of 2 L nasal cannula Most likely patient's hypoxia secondary to atelectasis and secondary to the pain with deep inspiration Incentive spirometry ordered Patient is also tachycardic, with tachypnea, and had recent surgery 3 weeks ago.  CTA was done that did not show PE It is reported that oxygen saturations, oxygen supplementation requirement, did not change with opiates, okay to continue opiate pain medication Continue to monitor Diabetes mellitus type 2 Hold metformin N.p.o. Sliding scale coverage Continue to monitor GERD Continue Protonix Myotonia congenita Continue carbidopa levodopa Hyperlipidemia Continue statin   DVT Prophylaxis-   Heparin- SCDs   AM Labs Ordered, also please review Full Orders  Family Communication: No family at bedside  Code Status: Full  Admission status: Observation Time spent in minutes : Orin DO

## 2020-11-24 NOTE — Progress Notes (Addendum)
[facility]  Patient with ileus on CT, had ileus previously with his neurosurgery. Nothing surgical on CT noted. Recommend bowel rest as needed, correct electrolytes. If need surgery consult in future please let me know.   Discussed with Dr. Wynetta Emery.   Curlene Labrum, MD [facility] [address] [place], [place] [address] [phone] (office)

## 2020-11-24 NOTE — ED Notes (Signed)
Both blood cultures obtained.

## 2020-11-24 NOTE — Progress Notes (Signed)
[place] OF CARE NOTE   11/24/2020 3:53 PM  Danny Hampton was seen and examined.  The H&P by the admitting provider, orders, imaging was reviewed.  Please see new orders.  Will continue to follow.   Vitals:   11/24/20 1030 11/24/20 1330  BP: 130/75 124/79  Pulse: (!) 117 (!) 114  Resp: (!) 24 (!) 24  Temp:    SpO2: 95% 93%    Results for orders placed or performed during the hospital encounter of 11/23/20  Resp Panel by RT-PCR (Flu A&B, Covid) Nasopharyngeal Swab   Specimen: Nasopharyngeal Swab; Nasopharyngeal(NP) swabs in vial transport medium  Result Value Ref Range   SARS Coronavirus 2 by RT PCR NEGATIVE NEGATIVE   Influenza A by PCR NEGATIVE NEGATIVE   Influenza B by PCR NEGATIVE NEGATIVE  Culture, blood (routine x 2)   Specimen: BLOOD LEFT HAND  Result Value Ref Range   Specimen Description      BLOOD LEFT HAND BOTTLES DRAWN AEROBIC AND ANAEROBIC   Special Requests      Blood Culture adequate volume Performed at [facility], [address] [place], [place] [address]    Culture PENDING    Report Status PENDING   Culture, blood (routine x 2)   Specimen: BLOOD RIGHT HAND  Result Value Ref Range   Specimen Description      BLOOD RIGHT HAND BOTTLES DRAWN AEROBIC AND ANAEROBIC   Special Requests      Blood Culture adequate volume Performed at [facility], [address] [place], [place] [address]    Culture PENDING    Report Status PENDING   CBC with Differential/Platelet  Result Value Ref Range   WBC 14.9 (H) 4.0 - 10.5 K/uL   RBC 3.87 (L) 4.22 - 5.81 MIL/uL   Hemoglobin 12.4 (L) 13.0 - 17.0 g/dL   HCT 36.3 (L) 39.0 - 52.0 %   MCV 93.8 80.0 - 100.0 fL   MCH 32.0 26.0 - 34.0 pg   MCHC 34.2 30.0 - 36.0 g/dL   RDW 12.6 11.5 - 15.5 %   Platelets 196 150 - 400 K/uL   nRBC 0.0 0.0 - 0.2 %   Neutrophils Relative % 89 %   Neutro Abs 13.3 (H) 1.7 - 7.7 K/uL   Lymphocytes Relative 3 %   Lymphs Abs 0.4 (L) 0.7 - 4.0 K/uL   Monocytes Relative 7 %    Monocytes Absolute 1.0 0.1 - 1.0 K/uL   Eosinophils Relative 0 %   Eosinophils Absolute 0.0 0.0 - 0.5 K/uL   Basophils Relative 0 %   Basophils Absolute 0.0 0.0 - 0.1 K/uL   Immature Granulocytes 1 %   Abs Immature Granulocytes 0.14 (H) 0.00 - 0.07 K/uL  Comprehensive metabolic panel  Result Value Ref Range   Sodium 130 (L) 135 - 145 mmol/L   Potassium 3.8 3.5 - 5.1 mmol/L   Chloride 88 (L) 98 - 111 mmol/L   CO2 27 22 - 32 mmol/L   Glucose, Bld 184 (H) 70 - 99 mg/dL   BUN 19 8 - 23 mg/dL   Creatinine, Ser 0.90 0.61 - 1.24 mg/dL   Calcium 8.9 8.9 - 10.3 mg/dL   Total Protein 7.6 6.5 - 8.1 g/dL   Albumin 4.1 3.5 - 5.0 g/dL   AST 33 15 - 41 U/L   ALT 20 0 - 44 U/L   Alkaline Phosphatase 100 38 - 126 U/L   Total Bilirubin 0.6 0.3 - 1.2 mg/dL   GFR, Estimated >  60 >60 mL/min   Anion gap 15 5 - 15  Procalcitonin - Baseline  Result Value Ref Range   Procalcitonin 1.14 ng/mL  Comprehensive metabolic panel  Result Value Ref Range   Sodium 130 (L) 135 - 145 mmol/L   Potassium 3.7 3.5 - 5.1 mmol/L   Chloride 89 (L) 98 - 111 mmol/L   CO2 32 22 - 32 mmol/L   Glucose, Bld 175 (H) 70 - 99 mg/dL   BUN 16 8 - 23 mg/dL   Creatinine, Ser 0.78 0.61 - 1.24 mg/dL   Calcium 8.6 (L) 8.9 - 10.3 mg/dL   Total Protein 7.0 6.5 - 8.1 g/dL   Albumin 3.6 3.5 - 5.0 g/dL   AST 33 15 - 41 U/L   ALT 17 0 - 44 U/L   Alkaline Phosphatase 90 38 - 126 U/L   Total Bilirubin 0.6 0.3 - 1.2 mg/dL   GFR, Estimated >60 >60 mL/min   Anion gap 9 5 - 15  Magnesium  Result Value Ref Range   Magnesium 1.5 (L) 1.7 - 2.4 mg/dL  CBC WITH DIFFERENTIAL  Result Value Ref Range   WBC 11.9 (H) 4.0 - 10.5 K/uL   RBC 3.84 (L) 4.22 - 5.81 MIL/uL   Hemoglobin 12.4 (L) 13.0 - 17.0 g/dL   HCT 36.9 (L) 39.0 - 52.0 %   MCV 96.1 80.0 - 100.0 fL   MCH 32.3 26.0 - 34.0 pg   MCHC 33.6 30.0 - 36.0 g/dL   RDW 12.8 11.5 - 15.5 %   Platelets 143 (L) 150 - 400 K/uL   nRBC 0.0 0.0 - 0.2 %   Neutrophils Relative % 85 %   Neutro Abs  10.1 (H) 1.7 - 7.7 K/uL   Lymphocytes Relative 3 %   Lymphs Abs 0.4 (L) 0.7 - 4.0 K/uL   Monocytes Relative 8 %   Monocytes Absolute 0.9 0.1 - 1.0 K/uL   Eosinophils Relative 3 %   Eosinophils Absolute 0.4 0.0 - 0.5 K/uL   Basophils Relative 0 %   Basophils Absolute 0.0 0.0 - 0.1 K/uL   Immature Granulocytes 1 %   Abs Immature Granulocytes 0.13 (H) 0.00 - 0.07 K/uL  Lactic acid, plasma  Result Value Ref Range   Lactic Acid, Venous 2.5 (HH) 0.5 - 1.9 mmol/L  Lactic acid, plasma  Result Value Ref Range   Lactic Acid, Venous 3.2 (HH) 0.5 - 1.9 mmol/L  Lactic acid, plasma  Result Value Ref Range   Lactic Acid, Venous 2.6 (HH) 0.5 - 1.9 mmol/L  CBG monitoring, ED  Result Value Ref Range   Glucose-Capillary 172 (H) 70 - 99 mg/dL  CBG monitoring, ED  Result Value Ref Range   Glucose-Capillary 175 (H) 70 - 99 mg/dL   Time spent : 30 mins   Murvin Natal, MD [facility]   11/23/2020  7:37 PM How to contact the [facility] Attending or Consulting provider [place] or covering provider during after hours 7P -7A, for this patient?  Check the care team in [facility] and look for a) attending/consulting TR[facility] provider listed and b) the [facility] team listed Log into [email or web address] and use [place] universal password to access. If you do not have the password, please contact the hospital operator. Locate the [facility] provider you are looking for under [facility] and page to a number that you can be directly reached. If you still have difficulty reaching the provider, please page the [facility] (Director on Call) for the Hospitalists listed  on amion for assistance.

## 2020-11-24 NOTE — ED Notes (Signed)
Hospitalist in room assessing and talking to patient.

## 2020-11-24 NOTE — ED Notes (Signed)
Date and time results received: 11/24/20 0648   Test: LACTIC Critical Value: 2.5  Name of Provider Notified: Zierle-Ghosh, [place] DO

## 2020-11-25 ENCOUNTER — Other Ambulatory Visit (HOSPITAL_COMMUNITY): Payer: Self-pay | Admitting: *Deleted

## 2020-11-25 ENCOUNTER — Inpatient Hospital Stay (HOSPITAL_COMMUNITY): Payer: No Typology Code available for payment source

## 2020-11-25 DIAGNOSIS — M545 Low back pain, unspecified: Secondary | ICD-10-CM | POA: Diagnosis present

## 2020-11-25 DIAGNOSIS — R7881 Bacteremia: Secondary | ICD-10-CM

## 2020-11-25 DIAGNOSIS — G8929 Other chronic pain: Secondary | ICD-10-CM | POA: Diagnosis present

## 2020-11-25 DIAGNOSIS — K567 Ileus, unspecified: Secondary | ICD-10-CM | POA: Diagnosis present

## 2020-11-25 DIAGNOSIS — B9561 Methicillin susceptible Staphylococcus aureus infection as the cause of diseases classified elsewhere: Secondary | ICD-10-CM

## 2020-11-25 DIAGNOSIS — R1084 Generalized abdominal pain: Secondary | ICD-10-CM | POA: Diagnosis not present

## 2020-11-25 DIAGNOSIS — K9189 Other postprocedural complications and disorders of digestive system: Secondary | ICD-10-CM

## 2020-11-25 DIAGNOSIS — T8149XA Infection following a procedure, other surgical site, initial encounter: Secondary | ICD-10-CM | POA: Diagnosis present

## 2020-11-25 DIAGNOSIS — K219 Gastro-esophageal reflux disease without esophagitis: Secondary | ICD-10-CM | POA: Diagnosis not present

## 2020-11-25 DIAGNOSIS — E785 Hyperlipidemia, unspecified: Secondary | ICD-10-CM | POA: Diagnosis not present

## 2020-11-25 DIAGNOSIS — R109 Unspecified abdominal pain: Secondary | ICD-10-CM

## 2020-11-25 HISTORY — DX: Ileus, unspecified: K56.7

## 2020-11-25 HISTORY — DX: Bacteremia: R78.81

## 2020-11-25 HISTORY — DX: Other postprocedural complications and disorders of digestive system: K91.89

## 2020-11-25 HISTORY — DX: Other chronic pain: M54.50

## 2020-11-25 HISTORY — DX: Methicillin susceptible Staphylococcus aureus infection as the cause of diseases classified elsewhere: B95.61

## 2020-11-25 LAB — C-REACTIVE PROTEIN: CRP: 73.8 mg/dL — ABNORMAL HIGH (ref <1.0)

## 2020-11-25 LAB — CBC WITH DIFFERENTIAL/PLATELET
Abs Immature Granulocytes: 0.06 10*3/uL (ref 0.00–0.07)
Basophils Absolute: 0 10*3/uL (ref 0.0–0.1)
Basophils Relative: 0 %
Eosinophils Absolute: 0 10*3/uL (ref 0.0–0.5)
Eosinophils Relative: 0 %
HCT: 28.1 % — ABNORMAL LOW (ref 39.0–52.0)
Hemoglobin: 9.6 g/dL — ABNORMAL LOW (ref 13.0–17.0)
Immature Granulocytes: 1 %
Lymphocytes Relative: 3 %
Lymphs Abs: 0.2 10*3/uL — ABNORMAL LOW (ref 0.7–4.0)
MCH: 31.7 pg (ref 26.0–34.0)
MCHC: 34.2 g/dL (ref 30.0–36.0)
MCV: 92.7 fL (ref 80.0–100.0)
Monocytes Absolute: 0.4 10*3/uL (ref 0.1–1.0)
Monocytes Relative: 5 %
Neutro Abs: 6.1 10*3/uL (ref 1.7–7.7)
Neutrophils Relative %: 91 %
Platelets: 104 10*3/uL — ABNORMAL LOW (ref 150–400)
RBC: 3.03 MIL/uL — ABNORMAL LOW (ref 4.22–5.81)
RDW: 12.3 % (ref 11.5–15.5)
WBC: 6.8 10*3/uL (ref 4.0–10.5)
nRBC: 0 % (ref 0.0–0.2)

## 2020-11-25 LAB — ECHOCARDIOGRAM COMPLETE
Area-P 1/2: 4.07 cm2
Height: 64 in
S' Lateral: 2.8 cm
Weight: 3015.89 oz

## 2020-11-25 LAB — BLOOD CULTURE ID PANEL (REFLEXED) - BCID2

## 2020-11-25 LAB — SEDIMENTATION RATE: Sed Rate: 88 mm/hr — ABNORMAL HIGH (ref 0–16)

## 2020-11-25 LAB — COMPREHENSIVE METABOLIC PANEL
ALT: 9 U/L (ref 0–44)
AST: 41 U/L (ref 15–41)
Albumin: 2.7 g/dL — ABNORMAL LOW (ref 3.5–5.0)
Alkaline Phosphatase: 60 U/L (ref 38–126)
Anion gap: 9 (ref 5–15)
BUN: 14 mg/dL (ref 8–23)
CO2: 25 mmol/L (ref 22–32)
Calcium: 7.4 mg/dL — ABNORMAL LOW (ref 8.9–10.3)
Chloride: 90 mmol/L — ABNORMAL LOW (ref 98–111)
Creatinine, Ser: 0.79 mg/dL (ref 0.61–1.24)
GFR, Estimated: >60 mL/min (ref >60)
Glucose, Bld: 154 mg/dL — ABNORMAL HIGH (ref 70–99)
Potassium: 3.2 mmol/L — ABNORMAL LOW (ref 3.5–5.1)
Sodium: 124 mmol/L — ABNORMAL LOW (ref 135–145)
Total Bilirubin: 0.6 mg/dL (ref 0.3–1.2)
Total Protein: 5.6 g/dL — ABNORMAL LOW (ref 6.5–8.1)

## 2020-11-25 LAB — GLUCOSE, CAPILLARY
Glucose-Capillary: 173 mg/dL — ABNORMAL HIGH (ref 70–99)
Glucose-Capillary: 157 mg/dL — ABNORMAL HIGH (ref 70–99)
Glucose-Capillary: 136 mg/dL — ABNORMAL HIGH (ref 70–99)

## 2020-11-25 LAB — PROCALCITONIN: Procalcitonin: 2.44 ng/mL

## 2020-11-25 LAB — OSMOLALITY: Osmolality: 259 mOsm/kg — ABNORMAL LOW (ref 275–295)

## 2020-11-25 LAB — MAGNESIUM: Magnesium: 1.9 mg/dL (ref 1.7–2.4)

## 2020-11-25 IMAGING — DX DG ABD PORTABLE 1V
2 series · 2 of 2 positions shown · non-contrast
Comparison: CT scan of the abdomen [DATE]

CLINICAL DATA: Continued abdominal pain.

EXAM:
PORTABLE ABDOMEN - 1 VIEW

[abdomen supine grid (1 of 2)]
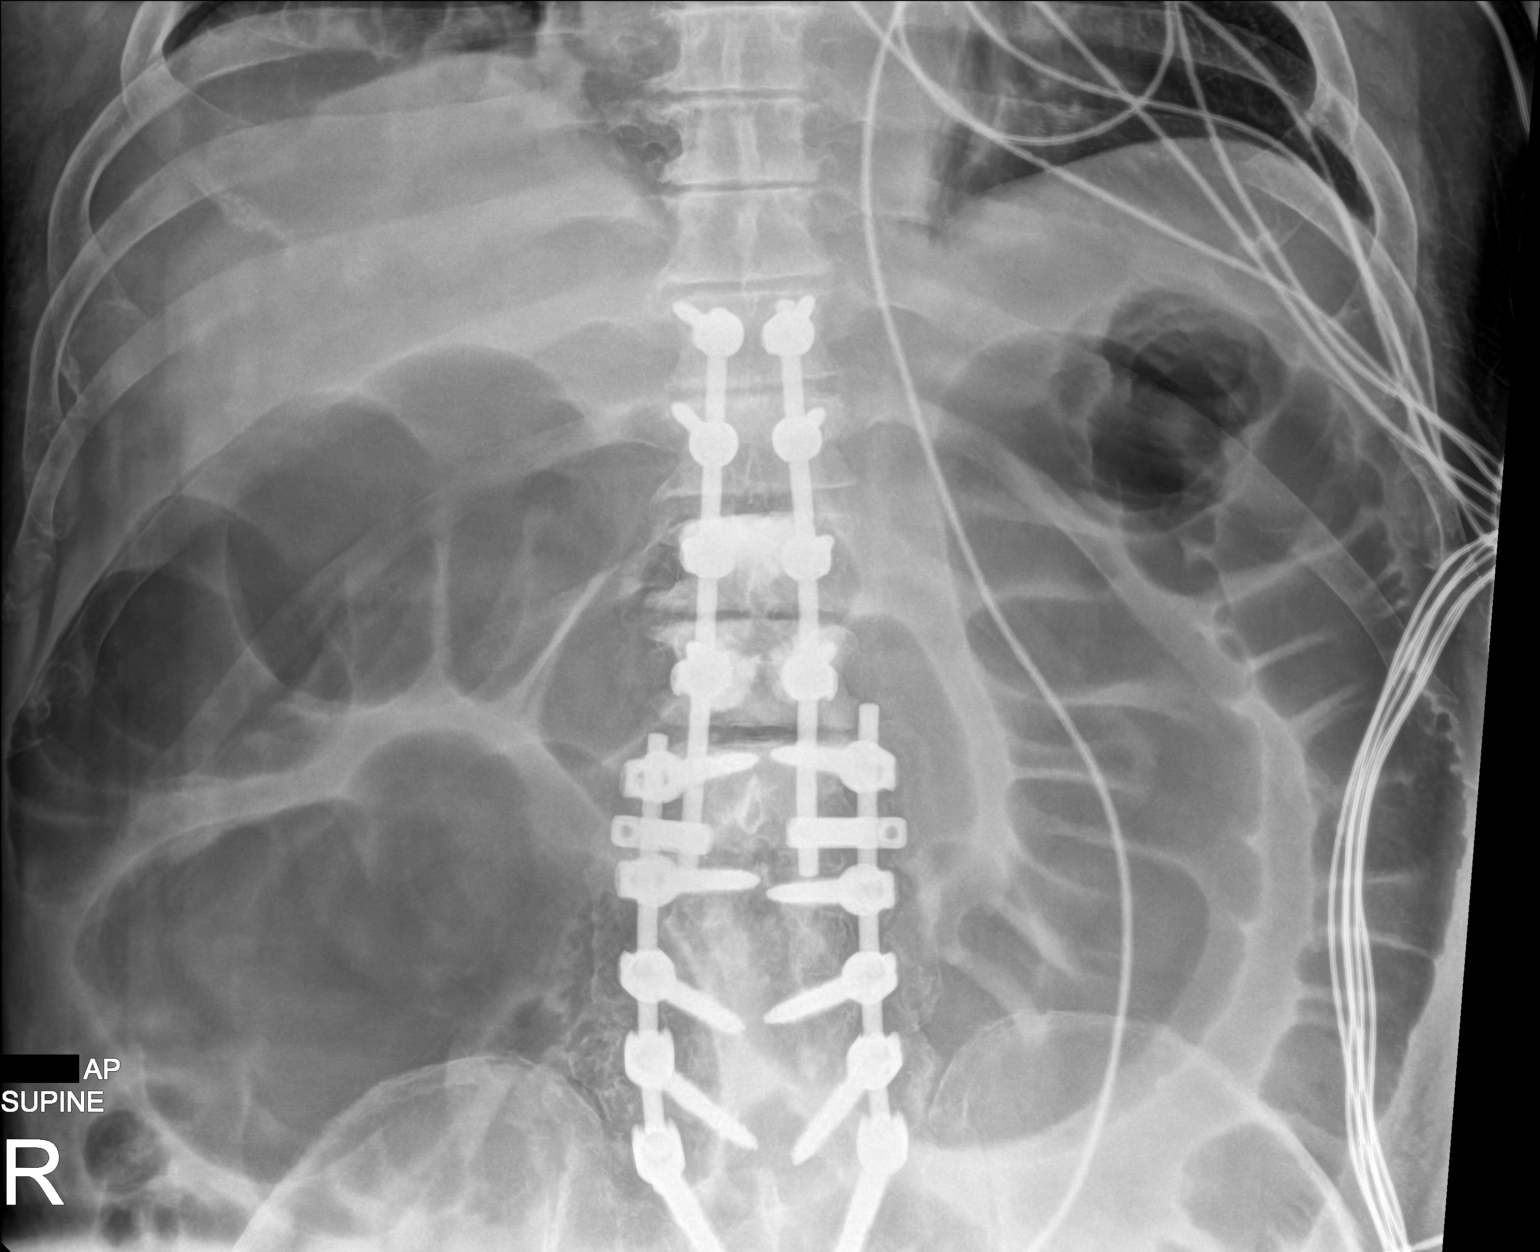

[abdomen supine grid (2 of 2)]
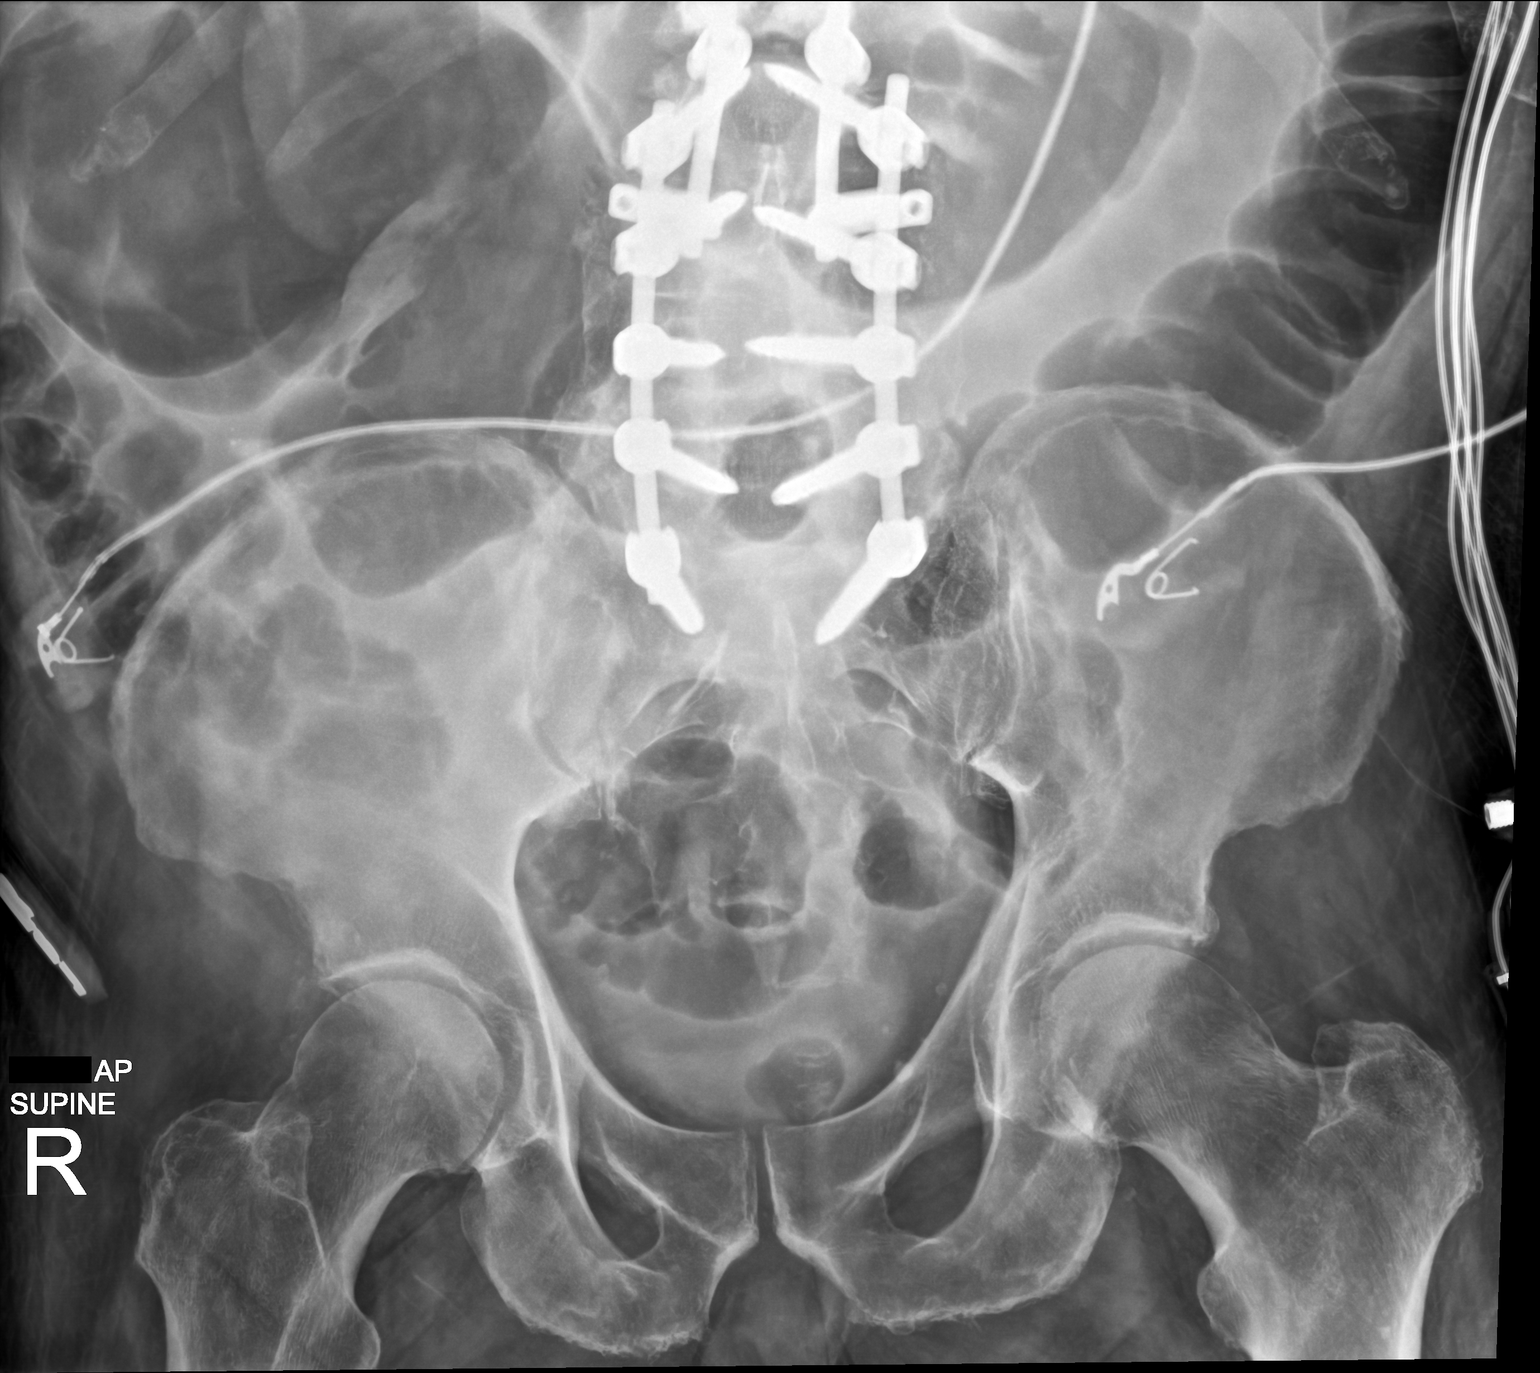

[2 of 2 positions shown; findings below may reference images not displayed]

FINDINGS: Pedicle rods and screws are seen from the lower thoracic spine
through the sacrum.

Lung bases are poorly visualized but unremarkable.

No free air, portal venous gas, or pneumatosis.

The colon is air-filled and prominent. Colonic gas extends to the
level the rectum. The dilated small bowel loops on the recent CT
scan are not as well visualized on this study.
IMPRESSION: 1. Prominent air-filled colon with colonic gas extending to the
level of the rectum. The findings suggest ileus. Previously
identified dilated loops of small bowel are not as well visualized
on this study. No other abnormalities.

## 2020-11-25 IMAGING — DX DG ABDOMEN 1V
2 series · 2 of 2 positions shown · non-contrast
Comparison: Abdominal x-ray [DATE].

CLINICAL DATA: NG tube placement.

EXAM:
ABDOMEN - 1 VIEW

[abdomen supine (1 of 2)]
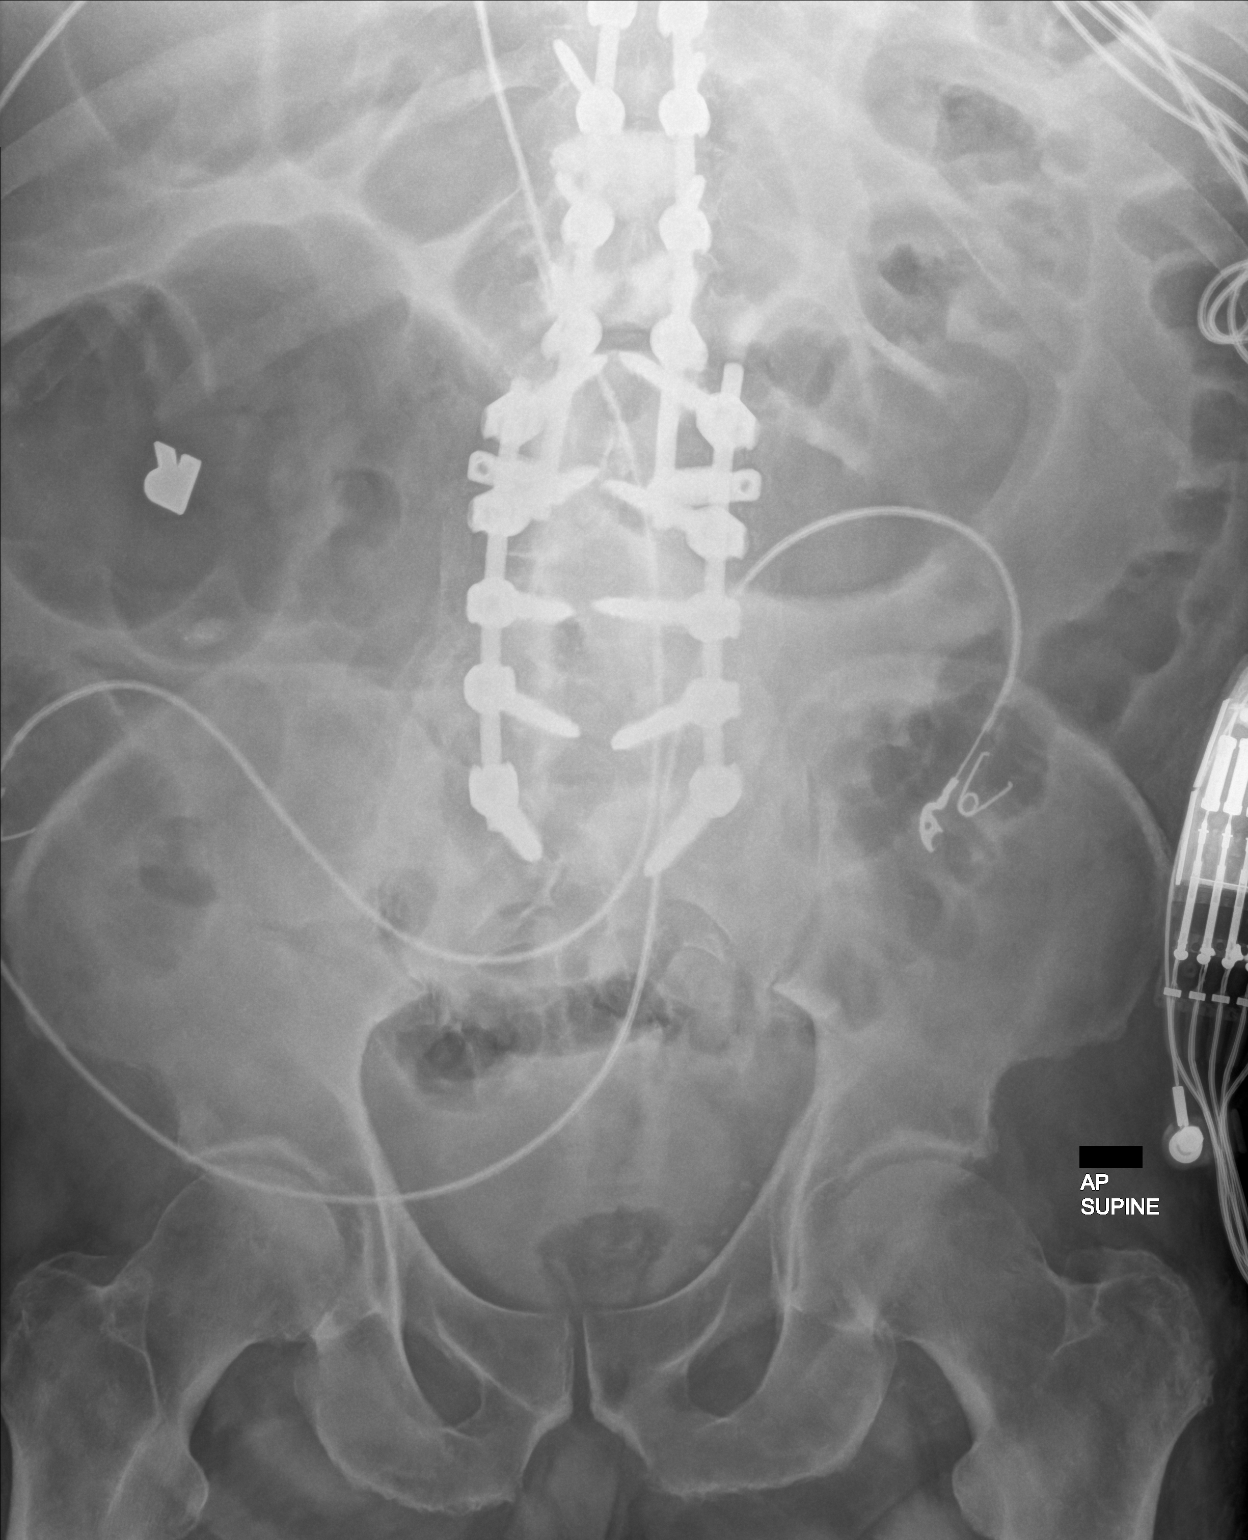

[abdomen supine (2 of 2)]
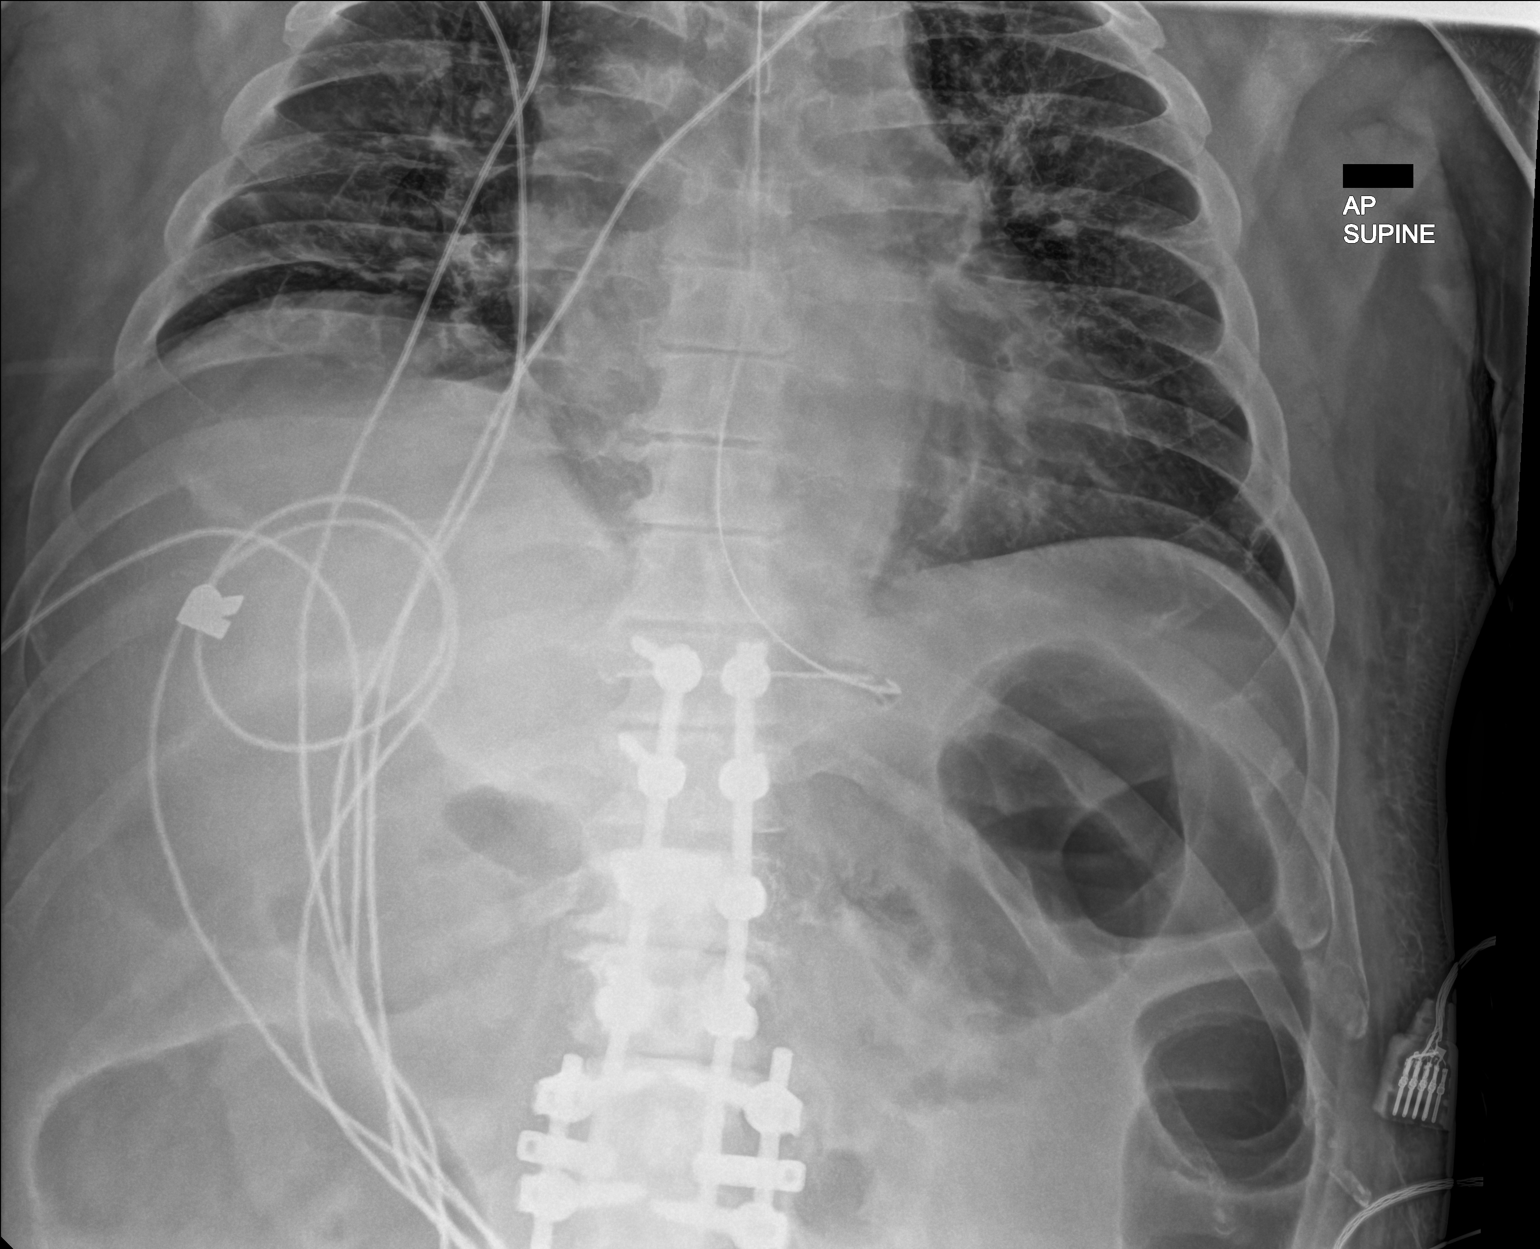

[2 of 2 positions shown; findings below may reference images not displayed]

FINDINGS: Nasogastric tube tip is in the proximal stomach, just beyond the
gastroesophageal junction.

Again seen is diffuse gaseous distension of the colon. This is
slightly decreased when compared to the prior study. Air seen within
central nondilated small bowel. The visualized lung bases are clear.
Thoracolumbar fusion hardware is again seen.
IMPRESSION: 1. Nasogastric tube tip is in the proximal stomach just beyond the
gastroesophageal junction. Recommend advancing tube.
2. Gaseous distension of the entire colon is again seen. Degree of
distention has slightly improved. Continued follow-up recommended.

## 2020-11-25 IMAGING — DX DG CHEST 1V PORT
1 series · 1 of 1 positions shown · non-contrast
Comparison: [DATE]

CLINICAL DATA: Tachypnea.

EXAM:
PORTABLE CHEST 1 VIEW

[chest ap grid]
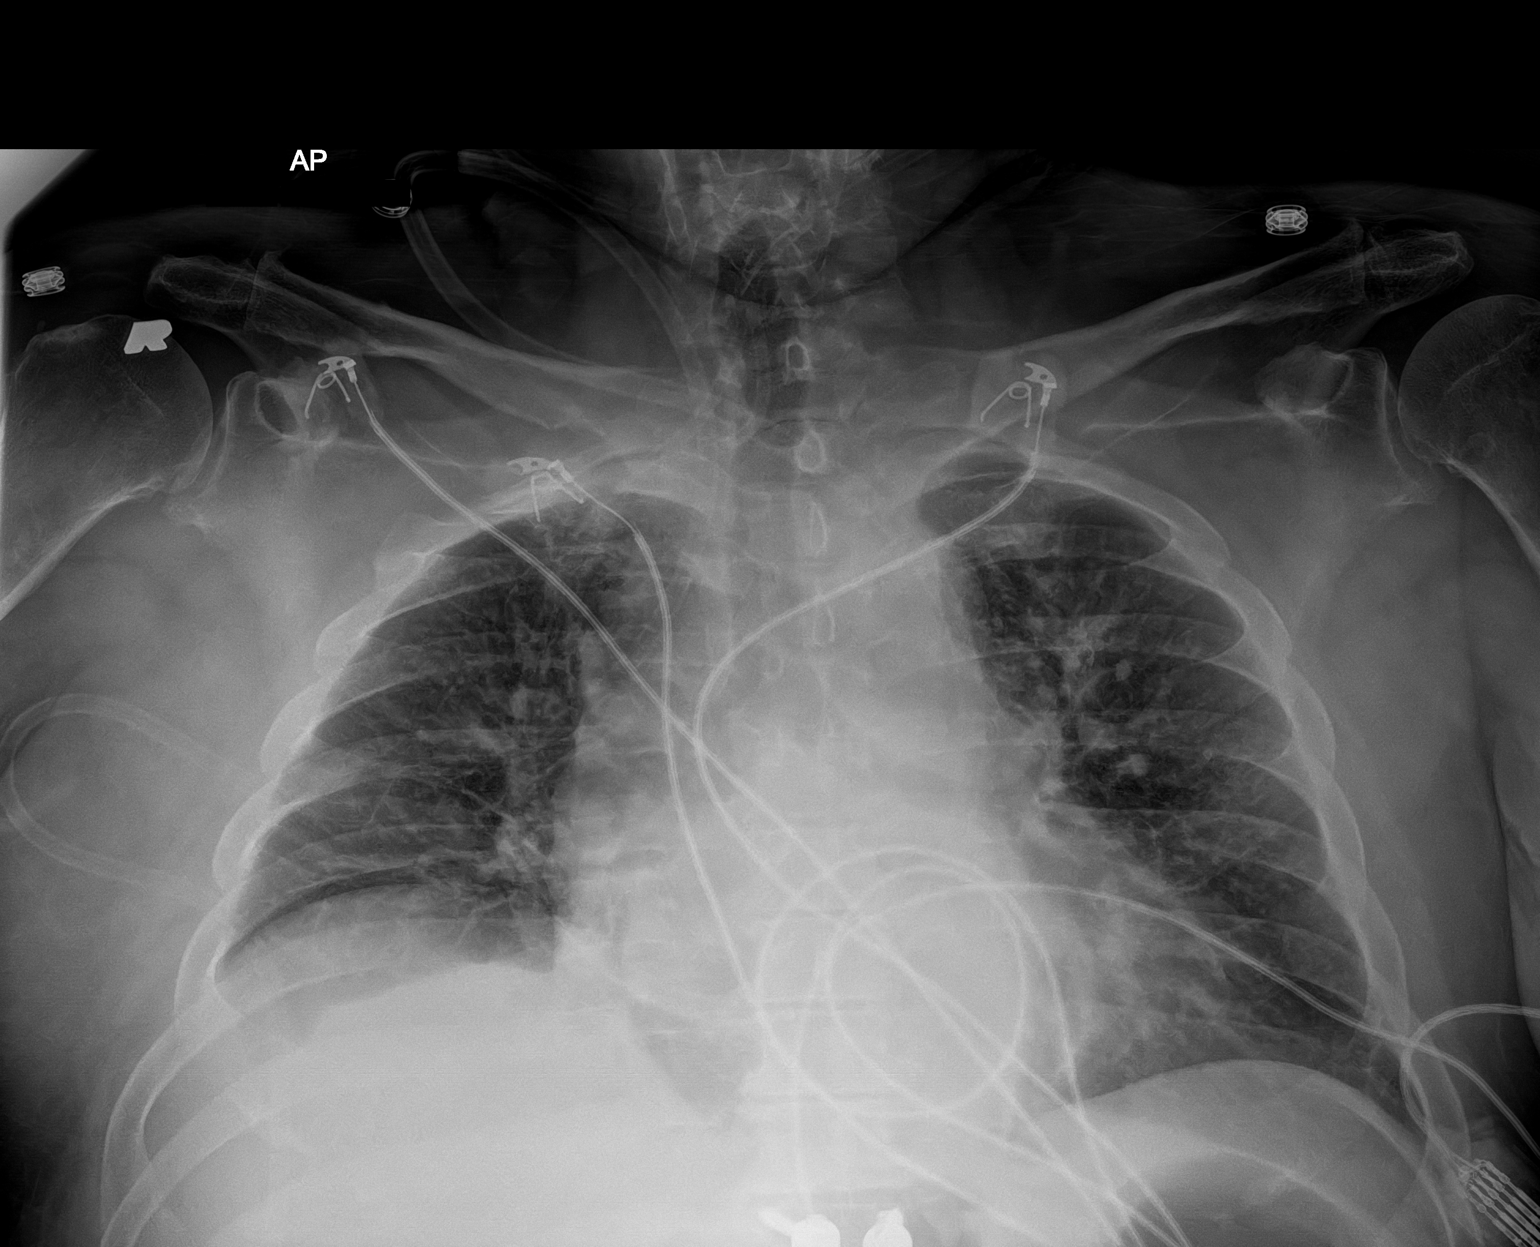

[1 of 1 positions shown; findings below may reference images not displayed]

FINDINGS: No pneumothorax. The low volume portable technique limits
evaluation. No focal infiltrates or overt edema. The
cardiomediastinal silhouette is stable. No other acute
abnormalities.
IMPRESSION: No acute abnormalities are identified. The study is limited due to
the low volume portable technique.

## 2020-11-25 MED ORDER — POTASSIUM CHLORIDE CRYS ER 20 MEQ PO TBCR
40.0000 meq | EXTENDED_RELEASE_TABLET | Freq: Once | ORAL | Status: AC
  Administered 2020-11-25: 40 meq via ORAL
  Filled 2020-11-25: qty 2

## 2020-11-25 MED ORDER — CEFAZOLIN SODIUM-DEXTROSE 2-4 GM/100ML-% IV SOLN
2.0000 g | Freq: Three times a day (TID) | INTRAVENOUS | Status: DC
  Administered 2020-11-25 – 2020-11-27 (×6): 2 g via INTRAVENOUS
  Filled 2020-11-25 (×7): qty 100

## 2020-11-25 MED ORDER — OXYCODONE HCL 5 MG PO TABS
10.0000 mg | ORAL_TABLET | Freq: Four times a day (QID) | ORAL | Status: DC | PRN
  Administered 2020-11-28 – 2020-12-07 (×25): 10 mg via ORAL
  Filled 2020-11-25 (×29): qty 2

## 2020-11-25 MED ORDER — HYDROMORPHONE HCL 1 MG/ML IJ SOLN
0.5000 mg | INTRAMUSCULAR | Status: DC | PRN
  Administered 2020-11-25 – 2020-12-04 (×45): 1 mg via INTRAVENOUS
  Administered 2020-12-04: 0.5 mg via INTRAVENOUS
  Administered 2020-12-05 – 2020-12-07 (×16): 1 mg via INTRAVENOUS
  Filled 2020-11-25 (×64): qty 1

## 2020-11-25 MED ORDER — POTASSIUM CHLORIDE IN NACL 20-0.9 MEQ/L-% IV SOLN
INTRAVENOUS | Status: DC
  Filled 2020-11-25: qty 1000

## 2020-11-25 MED ORDER — METOCLOPRAMIDE HCL 5 MG/ML IJ SOLN
10.0000 mg | Freq: Two times a day (BID) | INTRAMUSCULAR | Status: DC
  Administered 2020-11-25 – 2020-12-04 (×18): 10 mg via INTRAVENOUS
  Filled 2020-11-25 (×18): qty 2

## 2020-11-25 MED ORDER — METOPROLOL SUCCINATE ER 25 MG PO TB24
37.5000 mg | ORAL_TABLET | Freq: Every day | ORAL | Status: DC
  Administered 2020-11-25: 37.5 mg via ORAL
  Filled 2020-11-25: qty 2

## 2020-11-25 MED ORDER — MORPHINE SULFATE (PF) 2 MG/ML IV SOLN
2.0000 mg | INTRAVENOUS | Status: DC | PRN
  Administered 2020-11-25: 2 mg via INTRAVENOUS
  Filled 2020-11-25: qty 1

## 2020-11-25 NOTE — Progress Notes (Signed)
Paged re: hypoxia. Patient desatted down to 80s and ended up requiring 12L high flow to normalize. Patient is very distended and the pressure from his abdomen may be inhibiting his diaphragm. Repeat KUB shows gasseous distention of the entire colon - essentially a pseudo obstruction. 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 There is some concern that he may progress to toxic megacolon. NG and rectal tube ordered to attempt decompression. The rectal tube would be more beneficial, given the location of the distention, but patient refused. Starting reglan. I have reached back out to gen surg, who reports that they will see in the AM. 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 Neostigmine may be able to be trialed tomorrow, under with anesthesia monitoring.

## 2020-11-25 NOTE — Progress Notes (Signed)
PHARMACY - PHYSICIAN COMMUNICATION CRITICAL VALUE ALERT - BLOOD CULTURE IDENTIFICATION (BCID)  Danny Hampton is an 65 y.o. adult who presented to [facility] on 11/23/2020 with a chief complaint of abdominal pain  Assessment:  Tmax 102.5  LA 2.5 PCT 1.14> 2.44. WBC 14.9> 6.8 CT shows ileus, nothing surgical per surgery. CT angio is negative for PE. BCX's + MSSA in all 4 bottles+> Unsure of source  Name of physician (or Provider) Contacted: Dr. Wynetta Emery  Current antibiotics: zosyn   Changes to prescribed antibiotics recommended:  Recommendations accepted by provider to transition to cefazolin 2gm IV q8h  Results for orders placed or performed during the hospital encounter of 11/23/20  Blood Culture ID Panel (Reflexed) (Collected: 11/24/2020  8:15 AM)  Result Value Ref Range   Enterococcus faecalis NOT DETECTED NOT DETECTED   Enterococcus Faecium NOT DETECTED NOT DETECTED   Listeria monocytogenes NOT DETECTED NOT DETECTED   Staphylococcus species DETECTED (A) NOT DETECTED   Staphylococcus aureus (BCID) DETECTED (A) NOT DETECTED   Staphylococcus epidermidis NOT DETECTED NOT DETECTED   Staphylococcus lugdunensis NOT DETECTED NOT DETECTED   Streptococcus species NOT DETECTED NOT DETECTED   Streptococcus agalactiae NOT DETECTED NOT DETECTED   Streptococcus pneumoniae NOT DETECTED NOT DETECTED   Streptococcus pyogenes NOT DETECTED NOT DETECTED   A.calcoaceticus-baumannii NOT DETECTED NOT DETECTED   Bacteroides fragilis NOT DETECTED NOT DETECTED   Enterobacterales NOT DETECTED NOT DETECTED   Enterobacter cloacae complex NOT DETECTED NOT DETECTED   Escherichia coli NOT DETECTED NOT DETECTED   Klebsiella aerogenes NOT DETECTED NOT DETECTED   Klebsiella oxytoca NOT DETECTED NOT DETECTED   Klebsiella pneumoniae NOT DETECTED NOT DETECTED   Proteus species NOT DETECTED NOT DETECTED   Salmonella species NOT DETECTED NOT DETECTED   Serratia marcescens NOT DETECTED NOT DETECTED    Haemophilus influenzae NOT DETECTED NOT DETECTED   Neisseria meningitidis NOT DETECTED NOT DETECTED   Pseudomonas aeruginosa NOT DETECTED NOT DETECTED   Stenotrophomonas maltophilia NOT DETECTED NOT DETECTED   Candida albicans NOT DETECTED NOT DETECTED   Candida auris NOT DETECTED NOT DETECTED   Candida glabrata NOT DETECTED NOT DETECTED   Candida krusei NOT DETECTED NOT DETECTED   Candida parapsilosis NOT DETECTED NOT DETECTED   Candida tropicalis NOT DETECTED NOT DETECTED   Cryptococcus neoformans/gattii NOT DETECTED NOT DETECTED   Meth resistant mecA/C and MREJ NOT DETECTED NOT DETECTED    Isac Sarna, BS Pharm D, BCPS Clinical Pharmacist Pager [phone]  11/25/2020  9:08 AM

## 2020-11-25 NOTE — TOC Initial Note (Addendum)
Transition of Care [facility]) - Initial/Assessment Note    Patient Details  Name: Danny Hampton MRN: [id number] Date of Birth: 1955/07/18  [facility] CM/SW Contact:    Natasha Bence, LCSW Phone Number: 11/25/2020, 11:57 AM  Clinical Narrative:                 Patient is a 65 year old male admitted for Intractable abdominal pain. CSW conducted initial assessment. Patient's wife reported that patient uses a walker at home and that she did not believe patient could perform ADL's independently. CSW informed patient's wife that patient was able to walk 16f with PT. Patient's wife reported that she would be limited with her ability to assist patient with ADL's. CSW requested RN to work with patient to identify if patient could perform ADL's with minimum assistance. Patient is active with [facility]. CSW notified patient will require IV antibiotics upon discharge. [facility]. TOC to follow.  Expected Discharge Plan[place]: Continued Medical Work up   Patient Goals and CMS Choice Patient states their goals for this hospitalization and ongoing recovery are:: [facility] Medicare.gov Compare Post Acute Care list provided to:: Patient Choice offered to / list presented to : Patient  Expected Discharge Plan and Services Expected Discharge Plan: [place]Arranged: IV Antibiotics [facility]: 11[facility]20 Time HH Agency Contacted: 148[place] with [place]   Lives with:: Self, Spouse Patient language and need for interpreter reviewed:: Yes Do you feel safe going back to the place where you live?: Yes      Need for Family Participation in Patient Care: Yes (Comment) Care giver support system in place?: Yes  (comment)   Criminal Activity/Legal Involvement Pertinent to Current Situation/Hospitalization: No - Comment as needed  Activities of Daily Living Home Assistive Devices/Equipment: Walker (specify type) ADL Screening (condition at time of admission) Patient's cognitive ability adequate to safely complete daily activities?: No Is the patient deaf or have difficulty hearing?: No Does the patient have difficulty seeing, even when wearing glasses/contacts?: No Does the patient have difficulty concentrating, remembering, or making decisions?: No Patient able to express need for assistance with ADLs?: Yes Does the patient have difficulty dressing or bathing?: Yes Independently performs ADLs?: Yes (appropriate for developmental age) Communication: Needs assistance Is this a change from baseline?: Pre-admission baseline Dressing (OT): Needs assistance Is this a change from baseline?: Pre-admission baseline Grooming: Needs assistance Is this a change from baseline?: Pre-admission baseline Feeding: Needs assistance Is this a change from baseline?: Pre-admission baseline Bathing: Needs assistance Is this a change from baseline?: Pre-admission baseline Toileting: Needs assistance Is this a change from baseline?: Pre-admission baseline In/Out Bed: Needs assistance Is this a change from baseline?: Pre-admission baseline Does the patient have difficulty walking or climbing stairs?: Yes Weakness of Legs: Both Weakness of Arms/Hands: Both  Permission Sought/Granted Permission sought to share information with : Family Supports Permission granted to share information with : Yes, Verbal Permission Granted  Share Information with NAME: Videtto,Kathy  Permission granted to share info w AGENCY: Local HH agencies  Permission granted to share info w  Relationship: (Significant other)  Permission granted to share info w Contact Information: [phone]  Emotional Assessment     Affect (typically  observed): Accepting, Adaptable Orientation: : Oriented to Situation, Oriented to Self, Oriented to Place, Oriented to  Time Alcohol / Substance Use: Not Applicable Psych Involvement: No (comment)  Admission diagnosis:  Abdominal distention [R14.0] Abdominal distension [R14.0] Ileus (HCC) [K56.7] Generalized abdominal pain [R10.84] Hypoxia [R09.02] Intractable abdominal pain [R10.9] Patient Active Problem List   Diagnosis Date Noted   Intractable abdominal pain 11/24/2020   Abdominal distension 11/24/2020   Generalized abdominal pain    Spinal stenosis of lumbar region 10/31/2020   Primary hypertension    Hyperlipidemia    Acute respiratory failure with hypoxia ([place]) 04/21/2020   Hyponatremia 04/21/2020   Hypoalbuminemia 04/21/2020   GERD (gastroesophageal reflux disease) 04/21/2020   Hyperglycemia due to diabetes mellitus ([place]) 04/21/2020   Obesity (BMI 30-39.9) 04/21/2020   Myotonia congenita 04/21/2020   Acute respiratory failure due to COVID-19 ([place]) 04/20/2020   Pain in right shoulder 09/21/2019   Total knee replacement status, right 10/12/2018   Unilateral primary osteoarthritis, right knee 09/30/2018   Cervicalgia 04/21/2018   Bilateral primary osteoarthritis of knee 01/03/2016   PCP:  Center, [place]:   [facility] [address], [place] [address] [place] [address] Phone: [phone] Fax: [phone]     Social Determinants of Health (SDOH) Interventions    Readmission Risk Interventions No flowsheet data found.

## 2020-11-25 NOTE — Progress Notes (Signed)
PROGRESS NOTE   Danny Hampton  [id number] DOB: 1955/11/19 DOA: 11/23/2020 PCP: [facility]   Chief Complaint  Patient presents with   Constipation   Level of care: Progressive  Brief Admission History:  65 y.o. adult, with history of acid reflux, dysrhythmia, essential hypertension, high cholesterol, myotonia congenita, type 2 diabetes mellitus presents to the ED with a chief complaint of abdominal pain.  Patient reports that he feels like there is a blockage in his bowels.  He reports abdominal distention and a pain that he describes as pressure.  He reports that he had a back surgery in August 24, the pain started shortly after that.  He reports that after previous back surgery he developed an ileus, and he thought that is what was going on this time.  Patient was told that if he was producing flatulence, not to worry about an ileus.  He reports he has been producing flatulence.  Patient was seen yesterday at [facility], and sent home with a container of GoLytely.  Patient reports he drank the whole thing, but still feels like there is something wrong.  The pain is mostly in the right upper quadrant and the lower ribs.  He reports it is worse when he takes a deep breath.  He reports that he tries not to cough because that is painful.  Patient is not on any oxygen at baseline, and is requiring oxygen in the ER today 2 L nasal cannula.  Patient has no history of blood clot.  Patient reports that he has had multiple loose stools today as expected after GoLytely.  His last normal bowel movement was 2 days ago.  He reports that his last normal meal was 3 days ago.  He has not been vomiting.  Assessment & Plan:   Active Problems:   Staphylococcus aureus bacteremia   Postoperative wound infection   Ileus, postoperative (HCC)   Acute exacerbation of chronic low back pain   Acute respiratory failure with hypoxia (HCC)   GERD (gastroesophageal reflux disease)   Myotonia  congenita   Hyperlipidemia   Intractable abdominal pain   Abdominal distension   Generalized abdominal pain  MSSA bacteremia - Added CRP and Sed rate to labs and will follow - Cefazolin IV ordered  - TTE ordered, likely will need TEE - Repeated blood cultures ordered 9/18 - neurosurgery was consulted and recommended transfer to [facility]  Postoperative ileus - Pt having flatus and good bowel sounds.  - trial of clear liquid diet - No bowel obstruction seen on imaging   Postoperative wound infection - Purulent fluid coming from thoracic wound incision - wound culture sent to lab on 9/18 suspect MSSA - continue IV cefazolin  - Neurosurgery consultation at [facility] - MRI T/L spine ordered with and without contrast  Abdominal pain / distension  - secondary to ileus - IV and oral pain management ordered  Type 2 DM with neurological complications - continue SSI coverage and frequent CBG monitoring CBG (last 3)  Recent Labs    11/24/20 2025 11/25/20 0743 11/25/20 1059  GLUCAP 171* 173* 157*   Myotonia congenita  - continue home carbidopa/levodopa  Hyperlidemia  - resume home statin    DVT prophylaxis: heparin / SCDs Code Status: Full  Family Communication: t/c to sig other 9/18 Disposition: TBD Status is: Inpatient  Remains inpatient appropriate because:Inpatient level of care appropriate due to severity of illness  Dispo: The patient is from: Home  Anticipated d/c is to: Home              Patient currently is not medically stable to d/c.   Difficult to place patient No  Consultants:  Neurosurgery Meyran Saintclair Halsted) 9/18  Procedures:  N/a  Antimicrobials:  Zosyn 9/17-9/18 Cefazolin 9/18>>   Subjective: Pt complaints of severe low back and thoracic back pain and drainage from wound.    Objective: Vitals:   11/25/20 0616 11/25/20 0618 11/25/20 0626 11/25/20 0700  BP:    108/74  Pulse: (!) 114 (!) 113 (!) 112 (!) 109  Resp: 19 (!) '24 19 18  '$ Temp:  99.5 F  (37.5 C)  99.8 F (37.7 C)  TempSrc:  Oral  Oral  SpO2: 93% 94% 93% 93%  Weight:      Height:        Intake/Output Summary (Last 24 hours) at 11/25/2020 1533 Last data filed at 11/25/2020 0801 Gross per 24 hour  Intake 1015.61 ml  Output --  Net 1015.61 ml   Filed Weights   11/23/20 1947 11/24/20 1505 11/25/20 0421  Weight: 90.7 kg 90.9 kg 85.5 kg    Examination:  General exam: Appears to be in severe pain, lying supine in bed. Grimacing with pain.   Respiratory system: Clear to auscultation. Respiratory effort normal. Cardiovascular system: normal S1 & S2 heard. No JVD, murmurs, rubs, gallops or clicks. No pedal edema. Gastrointestinal system: Abdomen is distended, soft and mild diffuse tenderness.  No organomegaly or masses felt. Normal bowel sounds heard. Spinal exam: purulent drainage from thoracic wound.  Central nervous system: Alert and oriented. No focal neurological deficits. Extremities: Symmetric 5 x 5 power. Skin: No rashes, lesions or ulcers Psychiatry: Judgement and insight appear normal. Mood & affect appropriate.   Data Reviewed: I have personally reviewed following labs and imaging studies  CBC: Recent Labs  Lab 11/23/20 2003 11/24/20 0546 11/25/20 0306  WBC 14.9* 11.9* 6.8  NEUTROABS 13.3* 10.1* 6.1  HGB 12.4* 12.4* 9.6*  HCT 36.3* 36.9* 28.1*  MCV 93.8 96.1 92.7  PLT 196 143* 104*    Basic Metabolic Panel: Recent Labs  Lab 11/23/20 2003 11/24/20 0546 11/25/20 0306  NA 130* 130* 124*  K 3.8 3.7 3.2*  CL 88* 89* 90*  CO2 27 32 25  GLUCOSE 184* 175* 154*  BUN '19 16 14  '$ CREATININE 0.90 0.78 0.79  CALCIUM 8.9 8.6* 7.4*  MG  --  1.5* 1.9    GFR: Estimated Creatinine Clearance (by C-G formula based on SCr of 0.79 mg/dL) Male: 74.2 mL/min Male: 90.8 mL/min  Liver Function Tests: Recent Labs  Lab 11/23/20 2003 11/24/20 0546 11/25/20 0306  AST 33 33 41  ALT '20 17 9  '$ ALKPHOS 100 90 60  BILITOT 0.6 0.6 0.6  PROT 7.6 7.0 5.6*   ALBUMIN 4.1 3.6 2.7*    CBG: Recent Labs  Lab 11/24/20 1248 11/24/20 1713 11/24/20 2025 11/25/20 0743 11/25/20 1059  GLUCAP 175* 159* 171* 173* 157*    Recent Results (from the past 240 hour(s))  Resp Panel by RT-PCR (Flu A&B, Covid) Nasopharyngeal Swab     Status: None   Collection Time: 11/24/20 12:40 AM   Specimen: Nasopharyngeal Swab; Nasopharyngeal(NP) swabs in vial transport medium  Result Value Ref Range Status   SARS Coronavirus 2 by RT PCR NEGATIVE NEGATIVE Final    Comment: (NOTE) SARS-CoV-2 target nucleic acids are NOT DETECTED.  The SARS-CoV-2 RNA is generally detectable in upper respiratory specimens during the acute phase  of infection. The lowest concentration of SARS-CoV-2 viral copies this assay can detect is 138 copies/mL. A negative result does not preclude SARS-Cov-2 infection and should not be used as the sole basis for treatment or other patient management decisions. A negative result may occur with  improper specimen collection/handling, submission of specimen other than nasopharyngeal swab, presence of viral mutation(s) within the areas targeted by this assay, and inadequate number of viral copies(<138 copies/mL). A negative result must be combined with clinical observations, patient history, and epidemiological information. The expected result is Negative.  Fact Sheet for Patients:  EntrepreneurPulse.com.au  Fact Sheet for Healthcare Providers:  IncredibleEmployment.be  This test is no t yet approved or cleared by the [place] FDA and  has been authorized for detection and/or diagnosis of SARS-CoV-2 by FDA under an Emergency Use Authorization (EUA). This EUA will remain  in effect (meaning this test can be used) for the duration of the COVID-19 declaration under Section 564(b)(1) of the Act, 21 U.S.C.section 360bbb-3(b)(1), unless the authorization is terminated  or revoked sooner.       Influenza A  by PCR NEGATIVE NEGATIVE Final   Influenza B by PCR NEGATIVE NEGATIVE Final    Comment: (NOTE) The Xpert Xpress SARS-CoV-2/FLU/RSV plus assay is intended as an aid in the diagnosis of influenza from Nasopharyngeal swab specimens and should not be used as a sole basis for treatment. Nasal washings and aspirates are unacceptable for Xpert Xpress SARS-CoV-2/FLU/RSV testing.  Fact Sheet for Patients: EntrepreneurPulse.com.au  Fact Sheet for Healthcare Providers: IncredibleEmployment.be  This test is not yet approved or cleared by the [place] FDA and has been authorized for detection and/or diagnosis of SARS-CoV-2 by FDA under an Emergency Use Authorization (EUA). This EUA will remain in effect (meaning this test can be used) for the duration of the COVID-19 declaration under Section 564(b)(1) of the Act, 21 U.S.C. section 360bbb-3(b)(1), unless the authorization is terminated or revoked.  Performed at [facility], [address], [place], [place] [address]   Culture, blood (routine x 2)     Status: None (Preliminary result)   Collection Time: 11/24/20  8:15 AM   Specimen: BLOOD LEFT HAND  Result Value Ref Range Status   Specimen Description   Final    BLOOD LEFT HAND BOTTLES DRAWN AEROBIC AND ANAEROBIC Performed at [facility], [address], [place], [place] [address]    Special Requests   Final    Blood Culture adequate volume Performed at [facility], [address], [place], [place] [address]    Culture  Setup Time   Final    GRAM POSITIVE COCCI IN BOTH AEROBIC AND ANAEROBIC BOTTLES CRITICAL RESULT CALLED TO, READ BACK BY AND VERIFIED WITH: PHARM D L.POOLE ON [id number] AT 0818 BY E.PARRISH Performed at [facility], [place] [address] [place], [place] [address]    Culture GRAM POSITIVE COCCI  Final   Report Status PENDING  Incomplete  Blood Culture ID Panel (Reflexed)     Status: Abnormal   Collection Time: 11/24/20  8:15 AM   Result Value Ref Range Status   Enterococcus faecalis NOT DETECTED NOT DETECTED Final   Enterococcus Faecium NOT DETECTED NOT DETECTED Final   Listeria monocytogenes NOT DETECTED NOT DETECTED Final   Staphylococcus species DETECTED (A) NOT DETECTED Final    Comment: CRITICAL RESULT CALLED TO, READ BACK BY AND VERIFIED WITH: PHARM D L.POOLE ON [id number] AT 0818 BY E.PARRISH    Staphylococcus aureus (BCID) DETECTED (A) NOT DETECTED  Final    Comment: CRITICAL RESULT CALLED TO, READ BACK BY AND VERIFIED WITH: PHARM D L.POOLE ON [id number] AT 0818 BY E.PARRISH    Staphylococcus epidermidis NOT DETECTED NOT DETECTED Final   Staphylococcus lugdunensis NOT DETECTED NOT DETECTED Final   Streptococcus species NOT DETECTED NOT DETECTED Final   Streptococcus agalactiae NOT DETECTED NOT DETECTED Final   Streptococcus pneumoniae NOT DETECTED NOT DETECTED Final   Streptococcus pyogenes NOT DETECTED NOT DETECTED Final   A.calcoaceticus-baumannii NOT DETECTED NOT DETECTED Final   Bacteroides fragilis NOT DETECTED NOT DETECTED Final   Enterobacterales NOT DETECTED NOT DETECTED Final   Enterobacter cloacae complex NOT DETECTED NOT DETECTED Final   Escherichia coli NOT DETECTED NOT DETECTED Final   Klebsiella aerogenes NOT DETECTED NOT DETECTED Final   Klebsiella oxytoca NOT DETECTED NOT DETECTED Final   Klebsiella pneumoniae NOT DETECTED NOT DETECTED Final   Proteus species NOT DETECTED NOT DETECTED Final   Salmonella species NOT DETECTED NOT DETECTED Final   Serratia marcescens NOT DETECTED NOT DETECTED Final   Haemophilus influenzae NOT DETECTED NOT DETECTED Final   Neisseria meningitidis NOT DETECTED NOT DETECTED Final   Pseudomonas aeruginosa NOT DETECTED NOT DETECTED Final   Stenotrophomonas maltophilia NOT DETECTED NOT DETECTED Final   Candida albicans NOT DETECTED NOT DETECTED Final   Candida auris NOT DETECTED NOT DETECTED Final   Candida glabrata NOT DETECTED NOT DETECTED Final   Candida  krusei NOT DETECTED NOT DETECTED Final   Candida parapsilosis NOT DETECTED NOT DETECTED Final   Candida tropicalis NOT DETECTED NOT DETECTED Final   Cryptococcus neoformans/gattii NOT DETECTED NOT DETECTED Final   Meth resistant mecA/C and MREJ NOT DETECTED NOT DETECTED Final    Comment: Performed at [facility], [address], [place], [place] [address]  Culture, blood (routine x 2)     Status: None (Preliminary result)   Collection Time: 11/24/20  8:16 AM   Specimen: BLOOD RIGHT HAND  Result Value Ref Range Status   Specimen Description   Final    BLOOD RIGHT HAND BOTTLES DRAWN AEROBIC AND ANAEROBIC Performed at [facility], [address], [place], [place] [address]    Special Requests   Final    Blood Culture adequate volume Performed at [facility], [address] [place], [place] [address]    Culture  Setup Time   Final    GRAM POSITIVE COCCI IN BOTH AEROBIC AND ANAEROBIC BOTTLES CRITICAL VALUE NOTED.  VALUE IS CONSISTENT WITH PREVIOUSLY REPORTED AND CALLED VALUE. Performed at [facility], [place] [address] [place], [place] [address]    Culture   Final    NO GROWTH < 24 HOURS Performed at [facility], [address], [place], [place] [address]    Report Status PENDING  Incomplete  MRSA Next Gen by PCR, Nasal     Status: None   Collection Time: 11/24/20  3:30 PM   Specimen: Nasal Mucosa; Nasal Swab  Result Value Ref Range Status   MRSA by PCR Next Gen NOT DETECTED NOT DETECTED Final    Comment: (NOTE) The GeneXpert MRSA Assay (FDA approved for NASAL specimens only), is one component of a comprehensive MRSA colonization surveillance program. It is not intended to diagnose MRSA infection nor to guide or monitor treatment for MRSA infections. Test performance is not FDA approved in patients less than 76 years old. Performed at [facility], [address], [place],  [address]      Radiology Studies: CT Angio Chest Pulmonary  Embolism (PE) W or WO  Contrast  Result Date: 11/24/2020 CLINICAL DATA:  Shortness of breath. EXAM: CT ANGIOGRAPHY CHEST WITH CONTRAST TECHNIQUE: Multidetector CT imaging of the chest was performed using the standard protocol during bolus administration of intravenous contrast. Multiplanar CT image reconstructions and MIPs were obtained to evaluate the vascular anatomy. CONTRAST:  62m OMNIPAQUE IOHEXOL 350 MG/ML SOLN COMPARISON:  November 22, 2020 FINDINGS: Cardiovascular: There is mild calcification of the aortic arch without evidence of aortic aneurysm or dissection. Satisfactory opacification of the pulmonary arteries to the segmental level. No evidence of pulmonary embolism. Normal heart size. No pericardial effusion. Mediastinum/Nodes: No enlarged mediastinal, hilar, or axillary lymph nodes. Thyroid gland, trachea, and esophagus demonstrate no significant findings. Lungs/Pleura: Mild atelectasis is seen within the bilateral lower lobes and anterior aspect of the left upper lobe. There is no evidence of a pleural effusion or pneumothorax. Upper Abdomen: No acute abnormality. Musculoskeletal: Bilateral metallic density pedicle screws are seen within the lower thoracic spine. Review of the MIP images confirms the above findings. IMPRESSION: 1. No evidence of pulmonary embolism. 2. Mild bilateral lower lobe and left upper lobe atelectasis. 3. Aortic atherosclerosis. Aortic Atherosclerosis (ICD10-I70.0). Electronically Signed   By: TVirgina NorfolkM.D.   On: 11/24/2020 03:51   CT Abdomen Pelvis W Contrast  Result Date: 11/23/2020 CLINICAL DATA:  Abdominal pain and abdominal distension. EXAM: CT ABDOMEN AND PELVIS WITH CONTRAST TECHNIQUE: Multidetector CT imaging of the abdomen and pelvis was performed using the standard protocol following bolus administration of intravenous contrast. CONTRAST:  757mOMNIPAQUE IOHEXOL 350 MG/ML SOLN COMPARISON:  None. FINDINGS: Lower chest: There is atelectasis in the lung bases. Coronary artery  calcifications are present. Hepatobiliary: There is a rounded hypodensity in the left lobe of the liver which is too small to characterize, most likely a small cyst or hemangioma. There is no biliary ductal dilatation. There are no gallstones identified. Pancreas: Unremarkable. No pancreatic ductal dilatation or surrounding inflammatory changes. Spleen: Normal in size without focal abnormality. Adrenals/Urinary Tract: The bilateral adrenal glands are within normal limits. There is no hydronephrosis or perinephric fluid. There is a rounded hypodensity in the right renal cortex which is too small to characterize, likely a cyst. 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 The bladder is distended with contrast and within normal limits. Stomach/Bowel: There is no free air. Proximal colon is distended with air-fluid levels. The distal colon is decompressed. There is no definitive transition point visualized. There is sigmoid colon diverticulosis without evidence for acute diverticulitis. Additionally, small bowel loops are distended with air-fluid levels measuring up to 3.2 cm. No definite transition point visualized. The stomach appears within normal limits. Appendix is within normal limits. Vascular/Lymphatic: Aortic atherosclerosis. No enlarged abdominal or pelvic lymph nodes. Reproductive: Prostate is unremarkable. Other: There are small fat containing bilateral inguinal hernias. There is also small fat containing umbilical hernia. There is no ascites. Musculoskeletal: T10-S1 posterior fusion hardware is present. Vertebroplasty changes are seen at T12 and L1. IMPRESSION: 1. Mildly dilated small bowel with air-fluid levels. Findings are concerning for ileus/enteritis. No definite transition point visualized. Partial small bowel obstruction is not excluded. 2. Air-fluid levels distend the proximal colon and the distal colon is relatively decompressed. No definite transition point  visualized. Findings can be seen with colonic ileus and/or diarrhea. 3. Sigmoid colon diverticulosis without evidence for diverticulitis. Electronically Signed   By: AmRonney Asters.D.   On: 11/23/2020 22:54   DG CHEST PORT 1  VIEW  Result Date: 11/25/2020 CLINICAL DATA:  Tachypnea. EXAM: PORTABLE CHEST 1 VIEW COMPARISON:  April 20, 2020 FINDINGS: No pneumothorax. The low volume portable technique limits evaluation. No focal infiltrates or overt edema. The cardiomediastinal silhouette is stable. No other acute abnormalities. IMPRESSION: No acute abnormalities are identified. The study is limited due to the low volume portable technique. Electronically Signed   By: Dorise Bullion III M.D.   On: 11/25/2020 08:15   DG Abd Portable 1V  Result Date: 11/25/2020 CLINICAL DATA:  Continued abdominal pain. EXAM: PORTABLE ABDOMEN - 1 VIEW COMPARISON:  CT scan of the abdomen November 23, 2020 FINDINGS: Pedicle rods and screws are seen from the lower thoracic spine through the sacrum. Lung bases are poorly visualized but unremarkable. No free air, portal venous gas, or pneumatosis. The colon is air-filled and prominent. Colonic gas extends to the level the rectum. The dilated small bowel loops on the recent CT scan are not as well visualized on this study. IMPRESSION: 1. Prominent air-filled colon with colonic gas extending to the level of the rectum. The findings suggest ileus. Previously identified dilated loops of small bowel are not as well visualized on this study. No other abnormalities. Electronically Signed   By: Dorise Bullion III M.D.   On: 11/25/2020 08:18    Scheduled Meds:  aspirin  81 mg Oral Daily   atorvastatin  40 mg Oral Daily   Carbidopa-Levodopa ER  1 tablet Oral QID   Chlorhexidine Gluconate Cloth  6 each Topical Daily   heparin  5,000 Units Subcutaneous Q8H   insulin aspart  0-15 Units Subcutaneous TID WC   insulin aspart  0-5 Units Subcutaneous QHS   loratadine  10 mg Oral Daily    LORazepam  0.5 mg Oral TID   metoprolol succinate  37.5 mg Oral Daily   pantoprazole  40 mg Oral Daily   PARoxetine  40 mg Oral Daily   And   PARoxetine  20 mg Oral QHS   phenytoin  100 mg Oral TID   Continuous Infusions:  0.9 % NaCl with KCl 20 mEq / L 70 mL/hr at 11/25/20 0758    ceFAZolin (ANCEF) IV 2 g (11/25/20 1340)    LOS: 1 day   Time spent: 95 mins  Camela Wich Wynetta Emery, MD How to contact the [facility] Attending or Consulting provider [place] or covering provider during after hours [place], for this patient?  Check the care team in [facility] and look for a) attending/consulting [facility] provider listed and b) the [facility] team listed Log into [email or web address] and use 's universal password to access. If you do not have the password, please contact the hospital operator. Locate the [facility] provider you are looking for under [facility] and page to a number that you can be directly reached. If you still have difficulty reaching the provider, please page the [facility] (Director on Call) for the Hospitalists listed on amion for assistance.  11/25/2020, 3:33 PM

## 2020-11-25 NOTE — Progress Notes (Signed)
*  PRELIMINARY RESULTS* Echocardiogram 2D Echocardiogram has been performed.  Danny Hampton 11/25/2020, 12:34 PM

## 2020-11-25 NOTE — Progress Notes (Signed)
ID Automatic Consult for Staph aureus Bacteremia:  65 year old man presented to [facility] 11/24/2020 with abdominal pain.  CT abdomen/pelvis was concerning for ileus.  CTA of the chest was obtained due to shortness of breath which showed no evidence of pulmonary embolus and no other significant pulmonary process.  Of note, patient recently underwent spine surgery with hardware placement on 10/31/2020.  His admission blood cultures are growing GPC's in 4 out of 4 blood culture bottles with MSSA detected on BC ID.  He has been started on cefazolin.  TTE is pending as are MRI of his thoracic and lumbar spine given his recent surgical procedures.  Agree with cefazolin Agree with imaging of his spine.  May need transfer to [facility] for neurosurgery evaluation. Depending on TTE results, would recommend TEE     Raynelle Highland for Infectious Disease [facility] 11/25/2020, 3:35 PM

## 2020-11-26 ENCOUNTER — Inpatient Hospital Stay (HOSPITAL_COMMUNITY): Payer: No Typology Code available for payment source

## 2020-11-26 DIAGNOSIS — R140 Abdominal distension (gaseous): Secondary | ICD-10-CM | POA: Diagnosis not present

## 2020-11-26 DIAGNOSIS — R1084 Generalized abdominal pain: Secondary | ICD-10-CM | POA: Diagnosis not present

## 2020-11-26 DIAGNOSIS — R7881 Bacteremia: Secondary | ICD-10-CM | POA: Diagnosis not present

## 2020-11-26 DIAGNOSIS — M545 Low back pain, unspecified: Secondary | ICD-10-CM | POA: Diagnosis not present

## 2020-11-26 LAB — CBC WITH DIFFERENTIAL/PLATELET
Band Neutrophils: 10 %
Basophils Absolute: 0 10*3/uL (ref 0.0–0.1)
Basophils Relative: 0 %
Eosinophils Absolute: 0 10*3/uL (ref 0.0–0.5)
Eosinophils Relative: 0 %
HCT: 29.5 % — ABNORMAL LOW (ref 39.0–52.0)
Hemoglobin: 9.8 g/dL — ABNORMAL LOW (ref 13.0–17.0)
Lymphocytes Relative: 10 %
Lymphs Abs: 0.6 10*3/uL — ABNORMAL LOW (ref 0.7–4.0)
MCH: 31.7 pg (ref 26.0–34.0)
MCHC: 33.2 g/dL (ref 30.0–36.0)
MCV: 95.5 fL (ref 80.0–100.0)
Metamyelocytes Relative: 2 %
Monocytes Absolute: 0.3 10*3/uL (ref 0.1–1.0)
Monocytes Relative: 4 %
Myelocytes: 1 %
Neutro Abs: 5.3 10*3/uL (ref 1.7–7.7)
Neutrophils Relative %: 73 %
Platelets: 83 10*3/uL — ABNORMAL LOW (ref 150–400)
RBC: 3.09 MIL/uL — ABNORMAL LOW (ref 4.22–5.81)
RDW: 12.8 % (ref 11.5–15.5)
WBC: 6.4 10*3/uL (ref 4.0–10.5)
nRBC: 0 % (ref 0.0–0.2)

## 2020-11-26 LAB — URINALYSIS, ROUTINE W REFLEX MICROSCOPIC
Bilirubin Urine: NEGATIVE
Glucose, UA: NEGATIVE mg/dL
Ketones, ur: 20 mg/dL — AB
Leukocytes,Ua: NEGATIVE
Nitrite: NEGATIVE
Protein, ur: 100 mg/dL — AB
Specific Gravity, Urine: 1.023 (ref 1.005–1.030)
pH: 5 (ref 5.0–8.0)

## 2020-11-26 LAB — COMPREHENSIVE METABOLIC PANEL
ALT: 13 U/L (ref 0–44)
AST: 330 U/L — ABNORMAL HIGH (ref 15–41)
Albumin: 2.8 g/dL — ABNORMAL LOW (ref 3.5–5.0)
Alkaline Phosphatase: 55 U/L (ref 38–126)
Anion gap: 11 (ref 5–15)
BUN: 25 mg/dL — ABNORMAL HIGH (ref 8–23)
CO2: 22 mmol/L (ref 22–32)
Calcium: 7.6 mg/dL — ABNORMAL LOW (ref 8.9–10.3)
Chloride: 91 mmol/L — ABNORMAL LOW (ref 98–111)
Creatinine, Ser: 1.36 mg/dL — ABNORMAL HIGH (ref 0.61–1.24)
GFR, Estimated: 58 mL/min — ABNORMAL LOW (ref >60)
Glucose, Bld: 138 mg/dL — ABNORMAL HIGH (ref 70–99)
Potassium: 4.5 mmol/L (ref 3.5–5.1)
Sodium: 124 mmol/L — ABNORMAL LOW (ref 135–145)
Total Bilirubin: 0.8 mg/dL (ref 0.3–1.2)
Total Protein: 6.4 g/dL — ABNORMAL LOW (ref 6.5–8.1)

## 2020-11-26 LAB — SEDIMENTATION RATE: Sed Rate: 73 mm/hr — ABNORMAL HIGH (ref 0–16)

## 2020-11-26 LAB — C-REACTIVE PROTEIN: CRP: 57.5 mg/dL — ABNORMAL HIGH (ref <1.0)

## 2020-11-26 LAB — GLUCOSE, CAPILLARY
Glucose-Capillary: 154 mg/dL — ABNORMAL HIGH (ref 70–99)
Glucose-Capillary: 152 mg/dL — ABNORMAL HIGH (ref 70–99)
Glucose-Capillary: 125 mg/dL — ABNORMAL HIGH (ref 70–99)
Glucose-Capillary: 138 mg/dL — ABNORMAL HIGH (ref 70–99)
Glucose-Capillary: 133 mg/dL — ABNORMAL HIGH (ref 70–99)
Glucose-Capillary: 113 mg/dL — ABNORMAL HIGH (ref 70–99)

## 2020-11-26 LAB — OSMOLALITY, URINE: Osmolality, Ur: 509 mOsm/kg (ref 300–900)

## 2020-11-26 LAB — PROCALCITONIN: Procalcitonin: 3.95 ng/mL

## 2020-11-26 LAB — CHLORIDE, URINE, RANDOM: Chloride Urine: <15 mmol/L

## 2020-11-26 LAB — SODIUM, URINE, RANDOM: Sodium, Ur: <10 mmol/L

## 2020-11-26 IMAGING — MR MR THORACIC SPINE WO/W CM
2 series · 24 of 42 positions shown · IV contrast (10 ml Gadavist)
Comparison: CT myelogram [DATE].

CLINICAL DATA: Spinal fusion, thoracic, follow up Epidural abscess;
Spinal fusion, lumbosacral, follow up

EXAM:
MRI CERVICAL, THORACIC AND LUMBAR SPINE WITHOUT AND WITH CONTRAST
TECHNIQUE: Multiplanar and multiecho pulse sequences of the cervical spine, to
include the craniocervical junction and cervicothoracic junction,
and thoracic and lumbar spine, were obtained without and with
intravenous contrast.
CONTRAST:  10mL GADAVIST GADOBUTROL 1 MMOL/ML IV SOLN

[Series 36: T1 fat-sat post-contrast · sagittal · 4.0mm · 1.02mm/px · 13 of 13 slices shown]
[im 1/13]
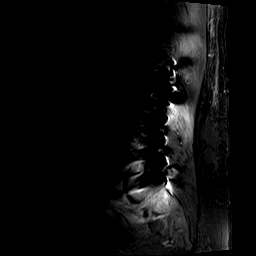
[im 2/13]
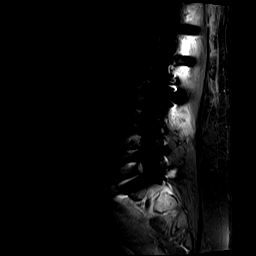
[im 3/13]
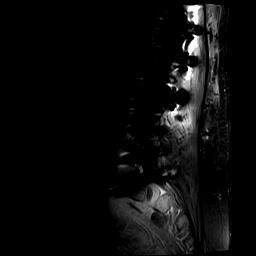
[im 4/13]
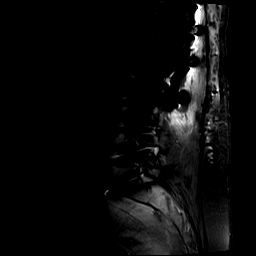
[im 5/13]
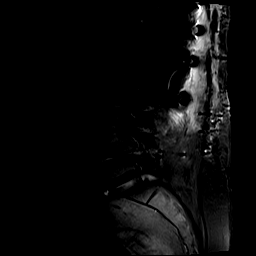
[im 6/13]
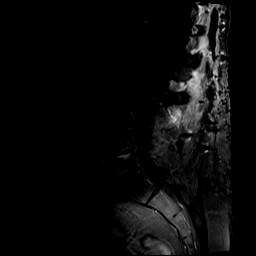
[im 7/13]
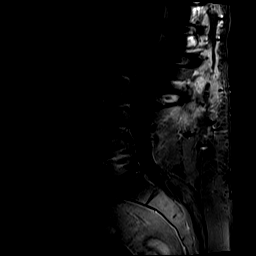
[im 8/13]
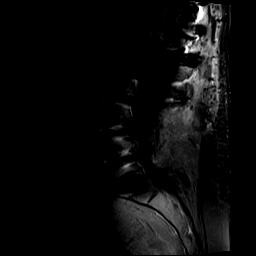
[im 9/13]
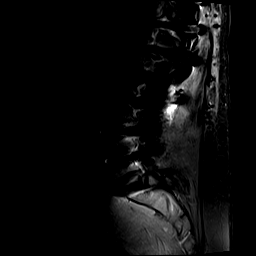
[im 10/13]
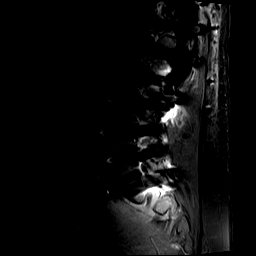
[im 11/13]
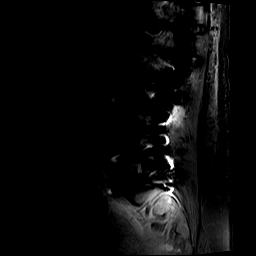
[im 12/13]
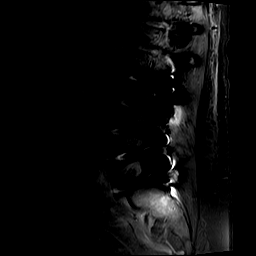
[im 13/13]
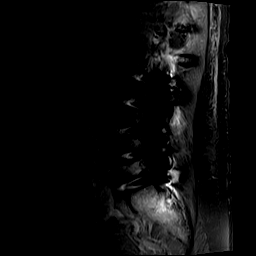

[Series 37: T1 · axial · 4.0mm · 0.35mm/px · z∈[-521,-341]mm · 11 of 29 slices shown]
[im 2/29]
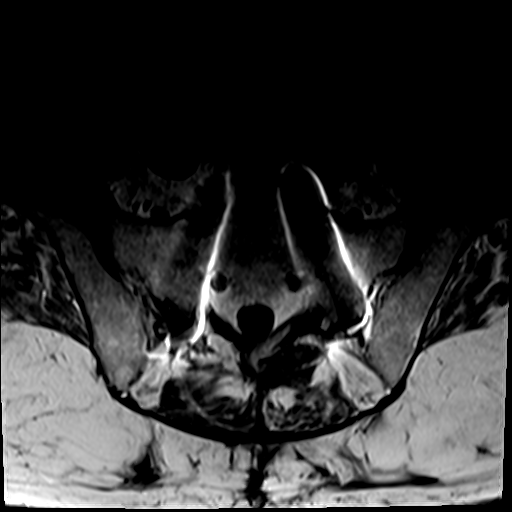
[im 5/29]
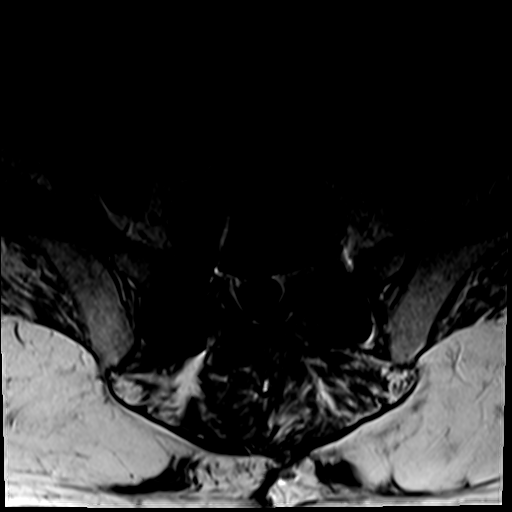
[im 6/29]
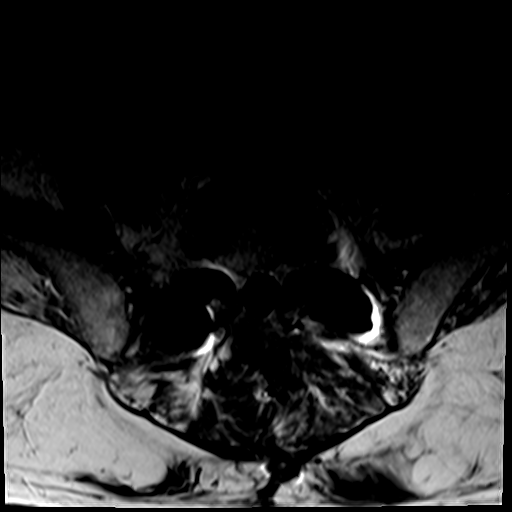
[im 10/29]
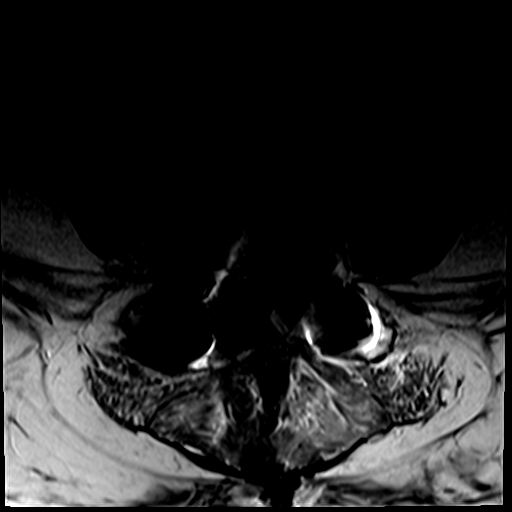
[im 13/29]
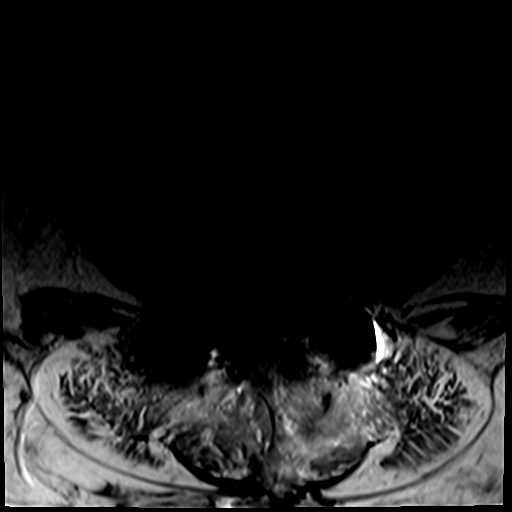
[im 15/29]
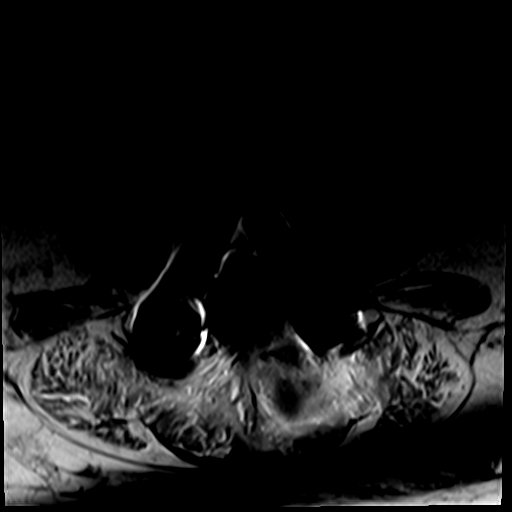
[im 17/29]
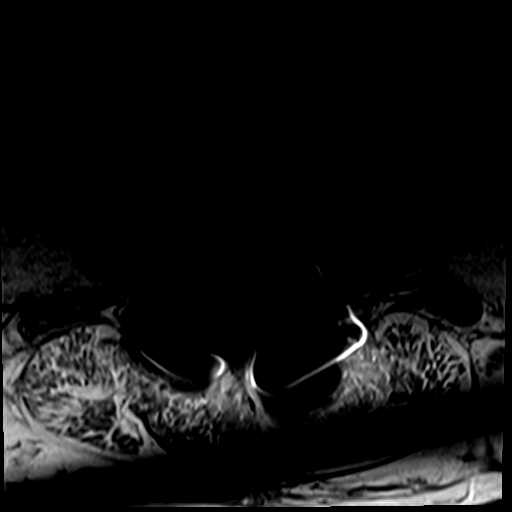
[im 20/29]
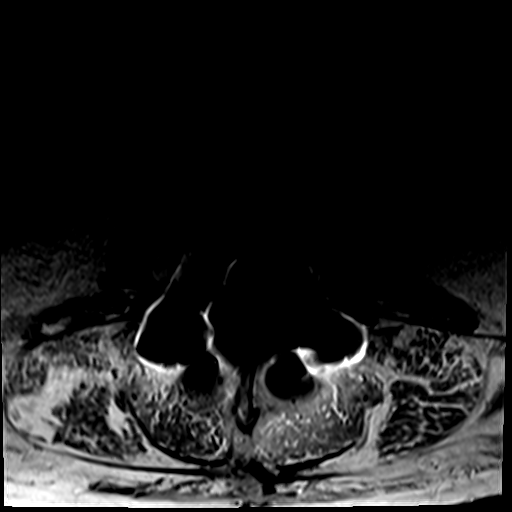
[im 24/29]
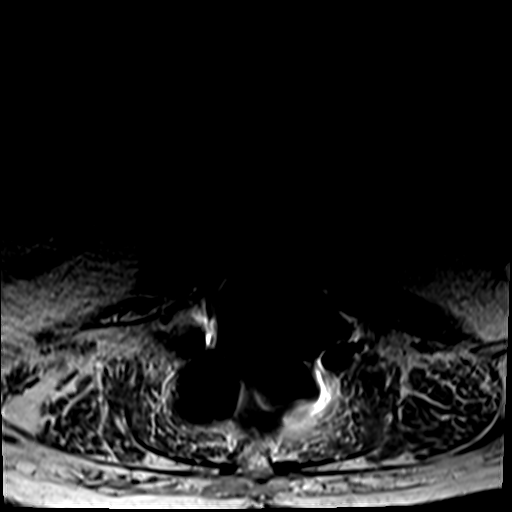
[im 25/29]
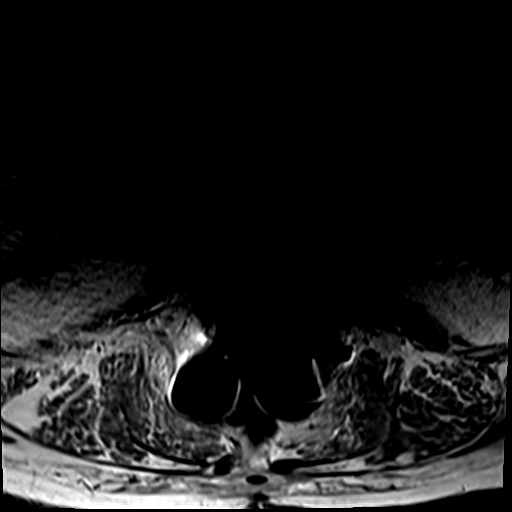
[im 28/29]
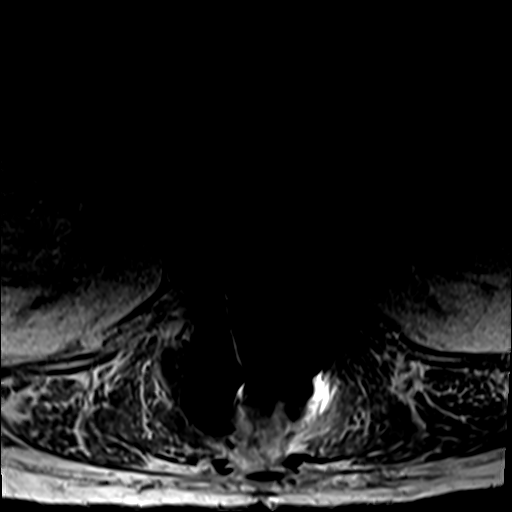

[24 of 42 positions shown; findings below may reference images not displayed]

FINDINGS: MRI CERVICAL SPINE FINDINGS

Significantly motion limited study.  Within this limitation:

Alignment: Similar alignment with reversal of the normal cervical
lordosis and grade 1 anterolisthesis of C3 on C4, C4 on C5, and C6
on C7.

Vertebrae: Motion limited evaluation. Degenerative/discogenic
endplate signal changes at multiple levels. Otherwise, no obvious
marrow edema to suggest acute fracture or discitis/osteomyelitis.

Cord: Motion limited evaluation without definite cord signal
abnormality.

Posterior Fossa, vertebral arteries, paraspinal tissues: Visualized
vertebral artery flow voids are maintained. No acute findings in the
visualized posterior fossa. There is nonspecific ill-defined edema
in the posterior paraspinal soft tissues.

Disc levels:

C2-C3: No significant disc protrusion, foraminal stenosis, or canal
stenosis.

C3-C4: Posterior disc osteophyte complex with left greater than
right facet and uncovertebral hypertrophy. Resulting moderate to
severe left foraminal stenosis and mild canal stenosis.

C4-C5: Posterior disc osteophyte complex with right greater than
left facet and uncovertebral hypertrophy. Resulting severe right
foraminal stenosis and moderate canal stenosis. Flattening of the
ventral cord.

C5-C6: Posterior disc osteophyte complex with mild bilateral facet
and uncovertebral hypertrophy. Mild canal stenosis without
significant foraminal stenosis.

C6-C7: Posterior disc osteophyte complex with central inferiorly
dissecting disc protrusion versus calcified PLL (as seen on prior
myelogram). Resulting moderate canal stenosis. Bilateral facet and
uncovertebral hypertrophy with mild to moderate bilateral foraminal
stenosis.

C7-T1: Posterior disc osteophyte complex with left greater than
right facet and uncovertebral hypertrophy. Central disc protrusion
with inferior extension. Resulting mild-to-moderate canal stenosis
and mild bilateral foraminal stenosis.

MRI THORACIC SPINE FINDINGS

Alignment:  Normal.

Vertebrae: Interval posterior fusion spanning from T10 inferiorly
into the lumbar spine. Nonspecific mild edema within the disc spaces
at T10-T11, T11-T12, and T12-L1.

Cord:  Normal cord signal.

Paraspinal and other soft tissues: Postoperative fluid collection in
the posterior paraspinal soft tissues in the lower thoracic spine
and lumbar spine (detailed further below).

Disc levels:

Mild

Mild disc bulging at multiple upper to mid thoracic levels. At T7-T8
right paracentral disc protrusion flattens the ventral cord with
mild canal stenosis, similar to prior. At T8-T9, disc bulging and
endplate spurring results in mild canal stenosis, similar. At
T9-T10, evaluation is limited by metallic artifact; however,
posterior disc bulging appears to result in mild to moderate canal
stenosis, possibly progressed from the prior but poorly
characterized. Interval extension of the patient's lumbar fusion
superiorly to T10. At T10-T11, the canal appears to be mild to
moderately moderately narrowed, similar versus slightly progressed.
At T11-T12 the canal appears to be patent.

MRI LUMBAR SPINE FINDINGS

Motion limited evaluation.  Within this limitation:

Segmentation:  Standard.

Alignment:  Normal.

Vertebrae:  Interval posterior fusion spanning from T10 to S1.

Conus medullaris and cauda equina: Conus extends to the L1-L2 level.
Conus appears normal.

Paraspinal and other soft tissues: Postoperative edema in the
paraspinal soft tissues with fluid collection in the paraspinal soft
tissues measuring up to 8 x 3 x 63 mm, centered at T12-L1. There is
also nonspecific fluid surrounding the posterior elements
bilaterally at L2-L3.

Disc levels:

T12-L1: Posterior fusion. Mild canal stenosis. Limited evaluation of
foramina due to metallic artifact.

L1-L2: Posterior fusion and decompression. Improved canal stenosis
with patent canal. Limited evaluation of the foramina due to
metallic artifact with visualized portions appearing patent.

L2-L3: Prior posterior decompression and fusion. Osteophytic
ridging. Facet hypertrophy. Mild canal stenosis, similar. Limited
evaluation of foramina with likely mild bilateral foraminal
stenosis.

L3-L4: Prior posterior fusion and decompression. Patent canal and
foramina.

L4-L5: Prior posterior fusion and decompression. Patent canal with
probably mild bilateral foraminal stenosis.

L5-S1: Prior posterior fusion and decompression. Patent canal with
mild left foraminal stenosis.
IMPRESSION: MRI cervical spine:

1. Similar moderate canal stenosis at C4-C5 and C6-C7
2. Similar severe foraminal stenosis on the right at C4-C5 and
moderate to severe foraminal stenosis on the left at C3-C4.
3. Similar inferiorly dissecting disc protrusion versus ossified PLL
behind the C7 vertebral body with mild-to-moderate canal stenosis at
C7-T1.
4. Nonspecific ill-defined edema in the posterior paraspinal soft
tissues, which could represent strain or cellulitis.

MRI thoracic and lumbar spine:

1. Status post extension of lumbar fusion superiorly to T10.
Nonspecific postoperative edema and fluid collection in the
posterior paraspinal soft tissues centered at T12, which could
represent a seroma but abscess is not excluded given reported
purulent drainage. There also is nonspecific fluid surrounding the
posterior elements at L2-L3.
2. Mild edema within the discs at T10-T11 and T12-L1, which could be
degenerative or stress related but early discitis is difficult to
exclude given the clinical history. No clearly infectious findings
within the canal, although evaluation is limited by metallic
artifact and motion. If there is clinical concern for infection,
consider correlation with inflammatory markers and close interval
follow-up MRI with contrast as clinically indicated.
3. Improved canal stenosis and foraminal stenosis at L1-L2 status
post decompression and fusion.
4. Mild to moderate canal stenosis at T9-T10 and T10-T11 appears
similar versus slightly progressed. Similar mild canal stenosis at
T7-T8.

## 2020-11-26 IMAGING — MR MR LUMBAR SPINE WO/W CM
13 of 22 series · 23 of 48 positions shown · IV contrast (gadavist)
Comparison: CT myelogram [DATE].

CLINICAL DATA: Spinal fusion, thoracic, follow up Epidural abscess;
Spinal fusion, lumbosacral, follow up

EXAM:
MRI CERVICAL, THORACIC AND LUMBAR SPINE WITHOUT AND WITH CONTRAST
TECHNIQUE: Multiplanar and multiecho pulse sequences of the cervical spine, to
include the craniocervical junction and cervicothoracic junction,
and thoracic and lumbar spine, were obtained without and with
intravenous contrast.
CONTRAST:  10mL GADAVIST GADOBUTROL 1 MMOL/ML IV SOLN

[Series 7: STIR · sagittal · 3.0mm · 0.86mm/px · 2 of 15 slices shown]
[im 1/15]
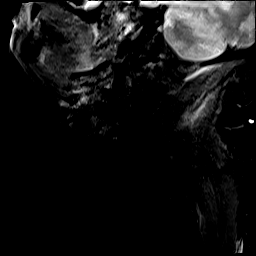
[im 15/15]
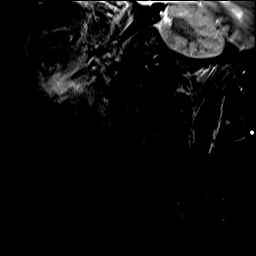

[Series 21: T1 · sagittal · 5.0mm · 1.76mm/px · 1 of 9 slices shown (1 of 6)]
[im 1/9]
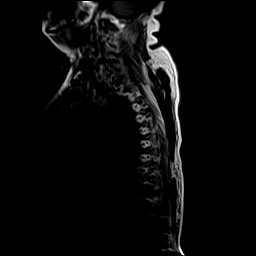

[Series 22: T2 · sagittal · 4.0mm · 1.25mm/px · 1 of 13 slices shown (1 of 3)]
[im 1/13]
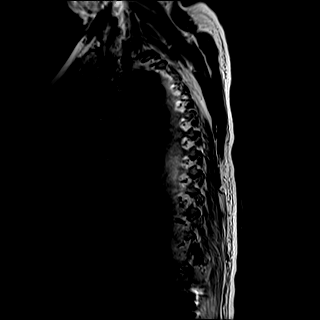

[Series 24: T1 · sagittal · 4.0mm · 1.56mm/px · 1 of 13 slices shown (2 of 6)]
[im 1/13]
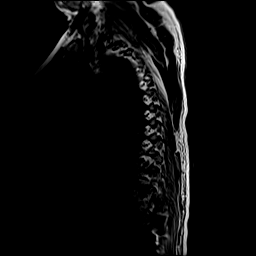

[Series 25: T1 fat-sat post-contrast · sagittal · 4.0mm · 1.56mm/px · 1 of 13 slices shown (1 of 3)]
[im 1/13]
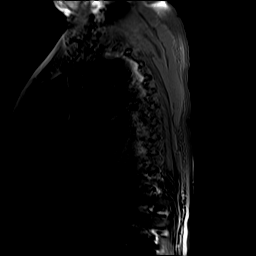

[Series 28: T1 · axial · non-contrast · 4.0mm · 0.35mm/px · z∈[-346,-118]mm · 4 of 36 slices shown (3 of 6)]
[im 1/36]
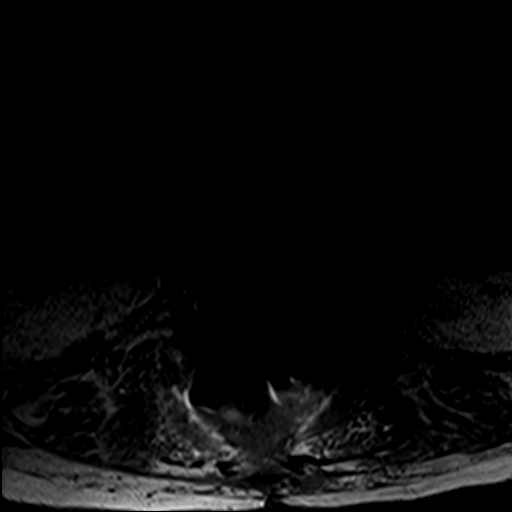
[im 12/36]
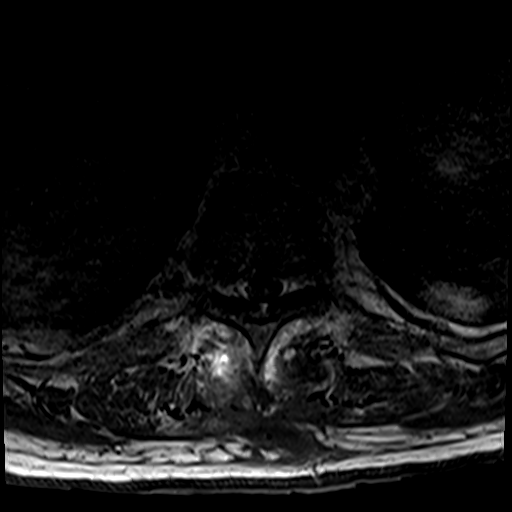
[im 24/36]
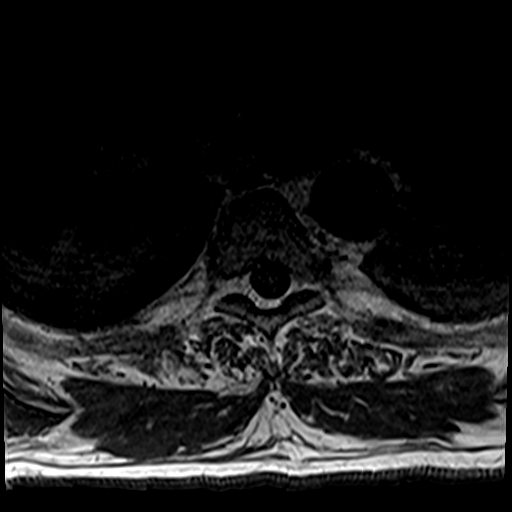
[im 36/36]
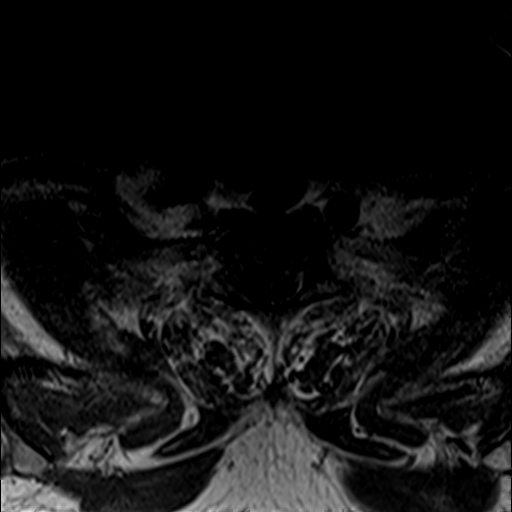

[Series 29: T2 · sagittal · 4.0mm · 0.81mm/px · 1 of 13 slices shown (2 of 3)]
[im 1/13]
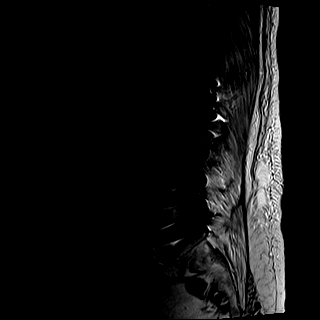

[Series 31: T1 · sagittal · 4.0mm · 1.02mm/px · 1 of 13 slices shown (4 of 6)]
[im 1/13]
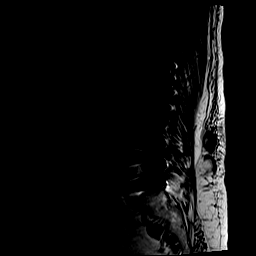

[Series 32: T2 · axial · 4.0mm · 0.70mm/px · z∈[-527,-336]mm · 3 of 29 slices shown (3 of 3)]
[im 1/29]
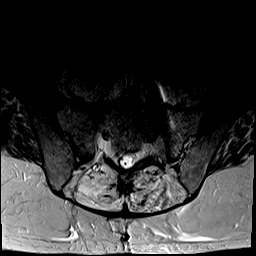
[im 15/29]
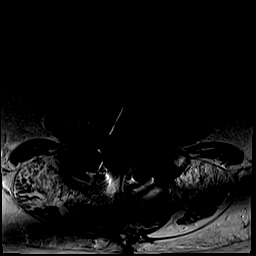
[im 29/29]
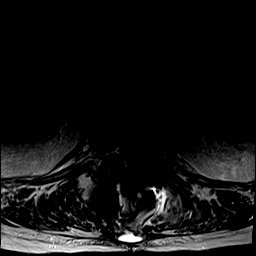

[Series 33: T1 · axial · 4.0mm · 0.35mm/px · z∈[-527,-336]mm · 3 of 29 slices shown (5 of 6)]
[im 1/29]
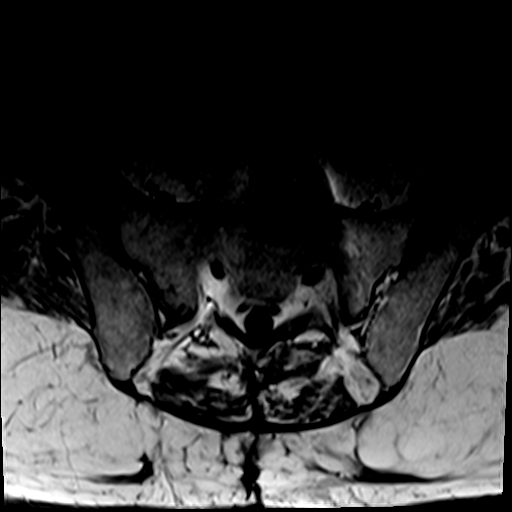
[im 15/29]
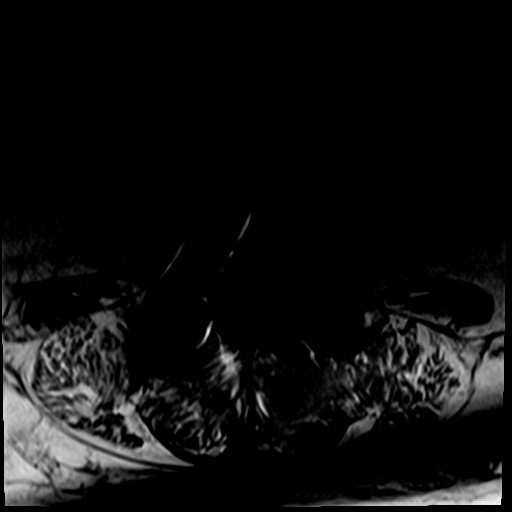
[im 29/29]
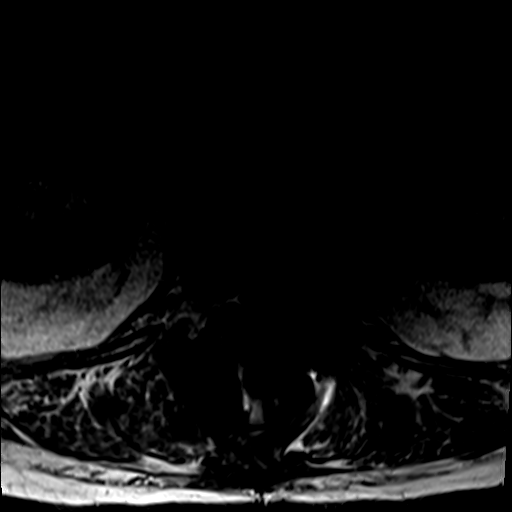

[Series 34: T1 fat-sat post-contrast · sagittal · 4.0mm · 1.56mm/px · 1 of 13 slices shown (2 of 3)]
[im 1/13]
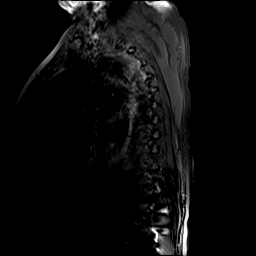

[Series 36: T1 fat-sat post-contrast · sagittal · 4.0mm · 1.02mm/px · 1 of 13 slices shown (3 of 3)]
[im 1/13]
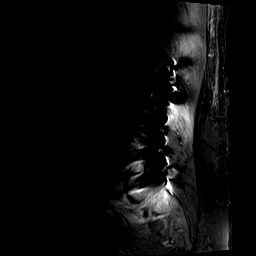

[Series 37: T1 · axial · 4.0mm · 0.35mm/px · z∈[-527,-336]mm · 3 of 29 slices shown (6 of 6)]
[im 1/29]
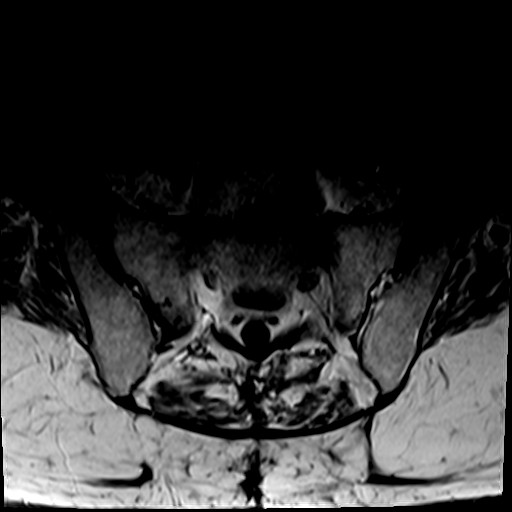
[im 15/29]
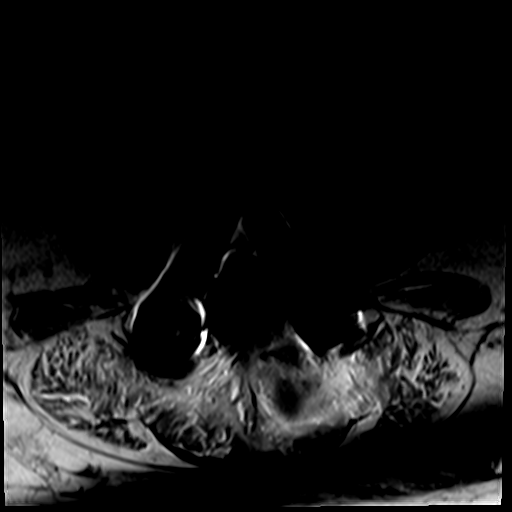
[im 29/29]
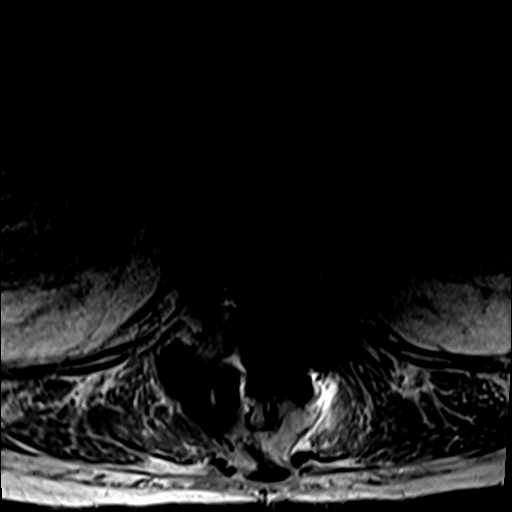

[23 of 48 positions shown; findings below may reference images not displayed]

FINDINGS: MRI CERVICAL SPINE FINDINGS

Significantly motion limited study.  Within this limitation:

Alignment: Similar alignment with reversal of the normal cervical
lordosis and grade 1 anterolisthesis of C3 on C4, C4 on C5, and C6
on C7.

Vertebrae: Motion limited evaluation. Degenerative/discogenic
endplate signal changes at multiple levels. Otherwise, no obvious
marrow edema to suggest acute fracture or discitis/osteomyelitis.

Cord: Motion limited evaluation without definite cord signal
abnormality.

Posterior Fossa, vertebral arteries, paraspinal tissues: Visualized
vertebral artery flow voids are maintained. No acute findings in the
visualized posterior fossa. There is nonspecific ill-defined edema
in the posterior paraspinal soft tissues.

Disc levels:

C2-C3: No significant disc protrusion, foraminal stenosis, or canal
stenosis.

C3-C4: Posterior disc osteophyte complex with left greater than
right facet and uncovertebral hypertrophy. Resulting moderate to
severe left foraminal stenosis and mild canal stenosis.

C4-C5: Posterior disc osteophyte complex with right greater than
left facet and uncovertebral hypertrophy. Resulting severe right
foraminal stenosis and moderate canal stenosis. Flattening of the
ventral cord.

C5-C6: Posterior disc osteophyte complex with mild bilateral facet
and uncovertebral hypertrophy. Mild canal stenosis without
significant foraminal stenosis.

C6-C7: Posterior disc osteophyte complex with central inferiorly
dissecting disc protrusion versus calcified PLL (as seen on prior
myelogram). Resulting moderate canal stenosis. Bilateral facet and
uncovertebral hypertrophy with mild to moderate bilateral foraminal
stenosis.

C7-T1: Posterior disc osteophyte complex with left greater than
right facet and uncovertebral hypertrophy. Central disc protrusion
with inferior extension. Resulting mild-to-moderate canal stenosis
and mild bilateral foraminal stenosis.

MRI THORACIC SPINE FINDINGS

Alignment:  Normal.

Vertebrae: Interval posterior fusion spanning from T10 inferiorly
into the lumbar spine. Nonspecific mild edema within the disc spaces
at T10-T11, T11-T12, and T12-L1.

Cord:  Normal cord signal.

Paraspinal and other soft tissues: Postoperative fluid collection in
the posterior paraspinal soft tissues in the lower thoracic spine
and lumbar spine (detailed further below).

Disc levels:

Mild

Mild disc bulging at multiple upper to mid thoracic levels. At T7-T8
right paracentral disc protrusion flattens the ventral cord with
mild canal stenosis, similar to prior. At T8-T9, disc bulging and
endplate spurring results in mild canal stenosis, similar. At
T9-T10, evaluation is limited by metallic artifact; however,
posterior disc bulging appears to result in mild to moderate canal
stenosis, possibly progressed from the prior but poorly
characterized. Interval extension of the patient's lumbar fusion
superiorly to T10. At T10-T11, the canal appears to be mild to
moderately moderately narrowed, similar versus slightly progressed.
At T11-T12 the canal appears to be patent.

MRI LUMBAR SPINE FINDINGS

Motion limited evaluation.  Within this limitation:

Segmentation:  Standard.

Alignment:  Normal.

Vertebrae:  Interval posterior fusion spanning from T10 to S1.

Conus medullaris and cauda equina: Conus extends to the L1-L2 level.
Conus appears normal.

Paraspinal and other soft tissues: Postoperative edema in the
paraspinal soft tissues with fluid collection in the paraspinal soft
tissues measuring up to 8 x 3 x 63 mm, centered at T12-L1. There is
also nonspecific fluid surrounding the posterior elements
bilaterally at L2-L3.

Disc levels:

T12-L1: Posterior fusion. Mild canal stenosis. Limited evaluation of
foramina due to metallic artifact.

L1-L2: Posterior fusion and decompression. Improved canal stenosis
with patent canal. Limited evaluation of the foramina due to
metallic artifact with visualized portions appearing patent.

L2-L3: Prior posterior decompression and fusion. Osteophytic
ridging. Facet hypertrophy. Mild canal stenosis, similar. Limited
evaluation of foramina with likely mild bilateral foraminal
stenosis.

L3-L4: Prior posterior fusion and decompression. Patent canal and
foramina.

L4-L5: Prior posterior fusion and decompression. Patent canal with
probably mild bilateral foraminal stenosis.

L5-S1: Prior posterior fusion and decompression. Patent canal with
mild left foraminal stenosis.
IMPRESSION: MRI cervical spine:

1. Similar moderate canal stenosis at C4-C5 and C6-C7
2. Similar severe foraminal stenosis on the right at C4-C5 and
moderate to severe foraminal stenosis on the left at C3-C4.
3. Similar inferiorly dissecting disc protrusion versus ossified PLL
behind the C7 vertebral body with mild-to-moderate canal stenosis at
C7-T1.
4. Nonspecific ill-defined edema in the posterior paraspinal soft
tissues, which could represent strain or cellulitis.

MRI thoracic and lumbar spine:

1. Status post extension of lumbar fusion superiorly to T10.
Nonspecific postoperative edema and fluid collection in the
posterior paraspinal soft tissues centered at T12, which could
represent a seroma but abscess is not excluded given reported
purulent drainage. There also is nonspecific fluid surrounding the
posterior elements at L2-L3.
2. Mild edema within the discs at T10-T11 and T12-L1, which could be
degenerative or stress related but early discitis is difficult to
exclude given the clinical history. No clearly infectious findings
within the canal, although evaluation is limited by metallic
artifact and motion. If there is clinical concern for infection,
consider correlation with inflammatory markers and close interval
follow-up MRI with contrast as clinically indicated.
3. Improved canal stenosis and foraminal stenosis at L1-L2 status
post decompression and fusion.
4. Mild to moderate canal stenosis at T9-T10 and T10-T11 appears
similar versus slightly progressed. Similar mild canal stenosis at
T7-T8.

## 2020-11-26 IMAGING — MR MR THORACIC SPINE WO/W CM
13 of 22 series · 23 of 48 positions shown · IV contrast (gadavist)
Comparison: CT myelogram [DATE].

CLINICAL DATA: Spinal fusion, thoracic, follow up Epidural abscess;
Spinal fusion, lumbosacral, follow up

EXAM:
MRI CERVICAL, THORACIC AND LUMBAR SPINE WITHOUT AND WITH CONTRAST
TECHNIQUE: Multiplanar and multiecho pulse sequences of the cervical spine, to
include the craniocervical junction and cervicothoracic junction,
and thoracic and lumbar spine, were obtained without and with
intravenous contrast.
CONTRAST:  10mL GADAVIST GADOBUTROL 1 MMOL/ML IV SOLN

[Series 7: STIR · sagittal · 3.0mm · 0.86mm/px · 2 of 15 slices shown]
[im 1/15]
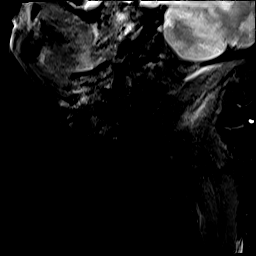
[im 15/15]
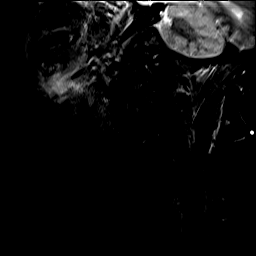

[Series 21: T1 · sagittal · 5.0mm · 1.76mm/px · 1 of 9 slices shown (1 of 6)]
[im 1/9]
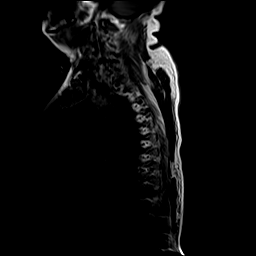

[Series 22: T2 · sagittal · 4.0mm · 1.25mm/px · 1 of 13 slices shown (1 of 3)]
[im 1/13]
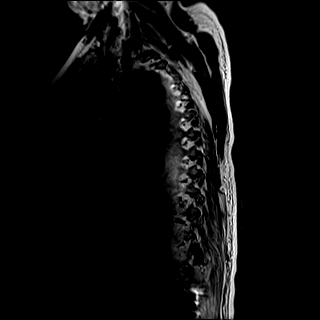

[Series 24: T1 · sagittal · 4.0mm · 1.56mm/px · 1 of 13 slices shown (2 of 6)]
[im 1/13]
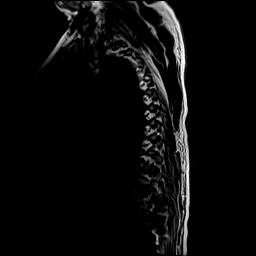

[Series 25: T1 fat-sat post-contrast · sagittal · 4.0mm · 1.56mm/px · 1 of 13 slices shown (1 of 3)]
[im 1/13]
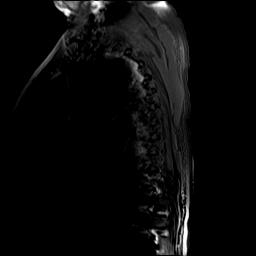

[Series 28: T1 · axial · non-contrast · 4.0mm · 0.35mm/px · z∈[-346,-118]mm · 4 of 36 slices shown (3 of 6)]
[im 1/36]
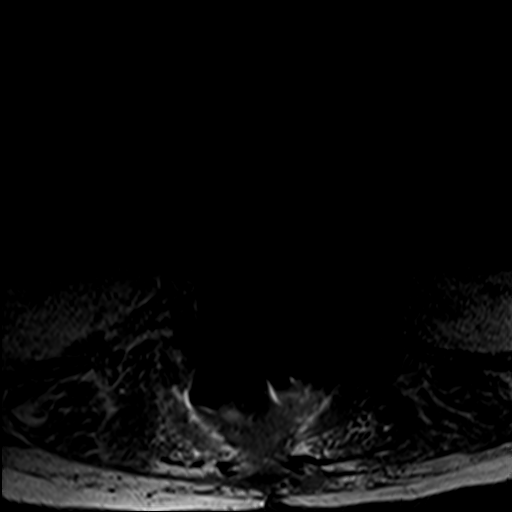
[im 12/36]
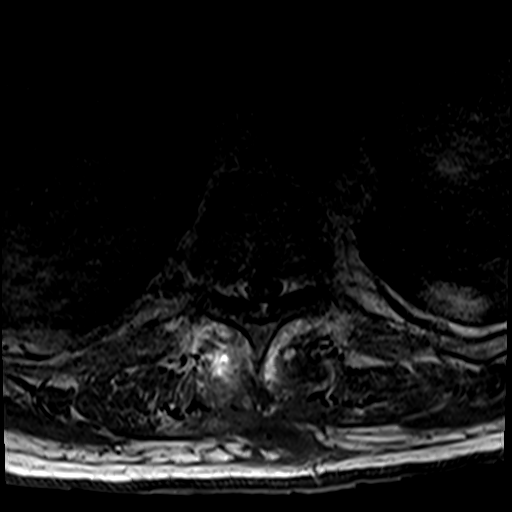
[im 24/36]
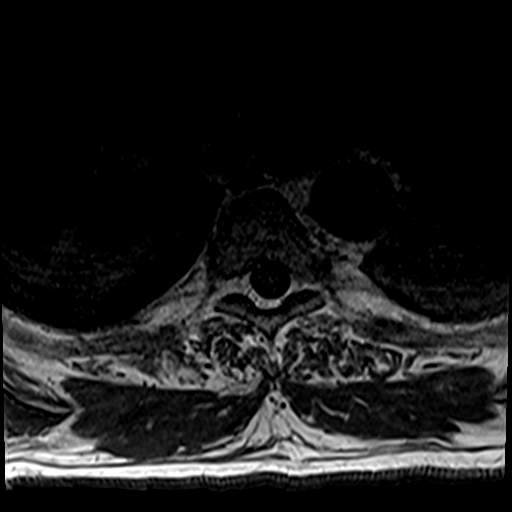
[im 36/36]
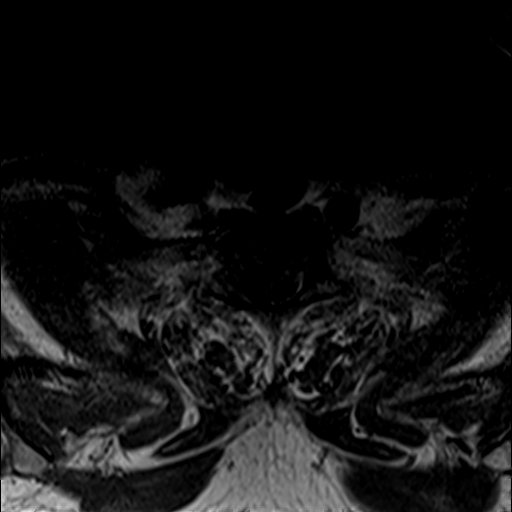

[Series 29: T2 · sagittal · 4.0mm · 0.81mm/px · 1 of 13 slices shown (2 of 3)]
[im 1/13]
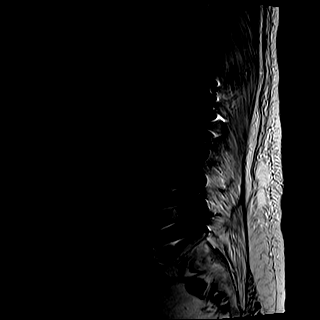

[Series 31: T1 · sagittal · 4.0mm · 1.02mm/px · 1 of 13 slices shown (4 of 6)]
[im 1/13]
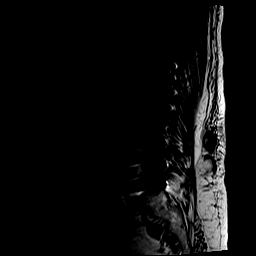

[Series 32: T2 · axial · 4.0mm · 0.70mm/px · z∈[-527,-336]mm · 3 of 29 slices shown (3 of 3)]
[im 1/29]
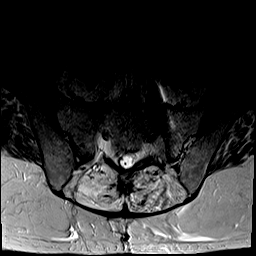
[im 15/29]
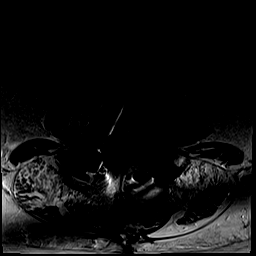
[im 29/29]
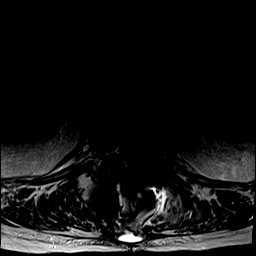

[Series 33: T1 · axial · 4.0mm · 0.35mm/px · z∈[-527,-336]mm · 3 of 29 slices shown (5 of 6)]
[im 1/29]
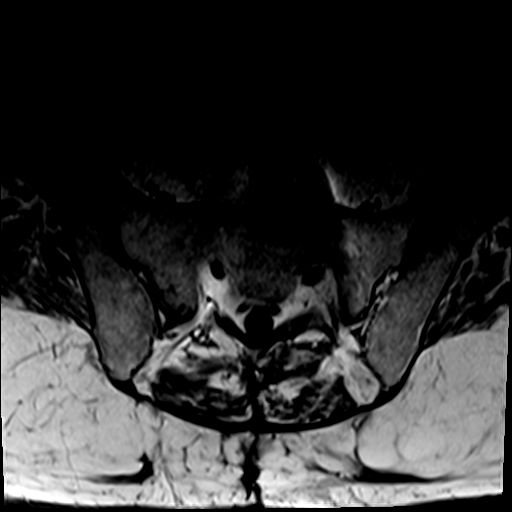
[im 15/29]
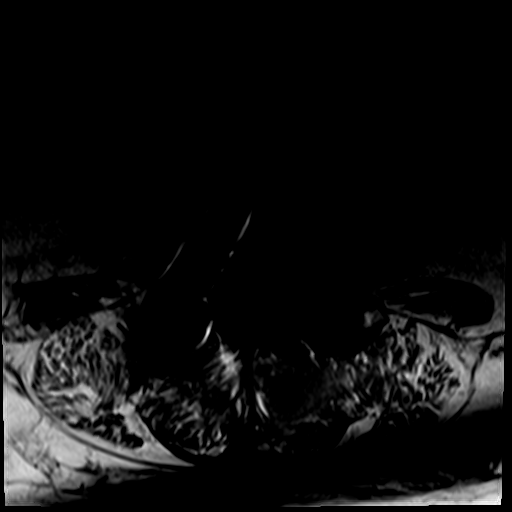
[im 29/29]
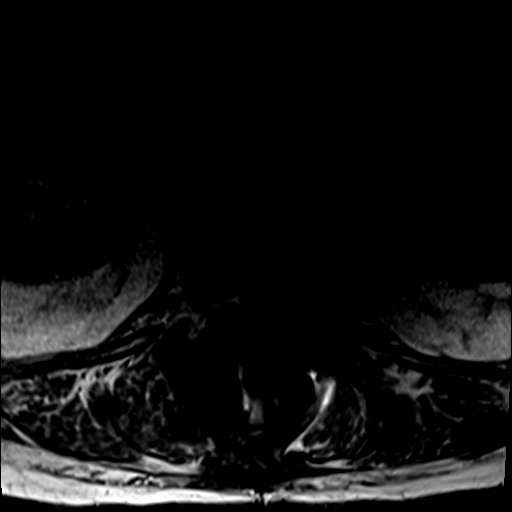

[Series 34: T1 fat-sat post-contrast · sagittal · 4.0mm · 1.56mm/px · 1 of 13 slices shown (2 of 3)]
[im 1/13]
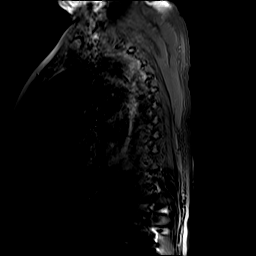

[Series 36: T1 fat-sat post-contrast · sagittal · 4.0mm · 1.02mm/px · 1 of 13 slices shown (3 of 3)]
[im 1/13]
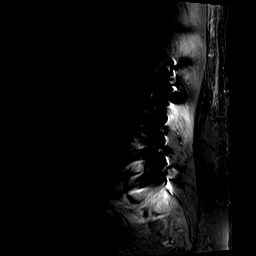

[Series 37: T1 · axial · 4.0mm · 0.35mm/px · z∈[-527,-336]mm · 3 of 29 slices shown (6 of 6)]
[im 1/29]
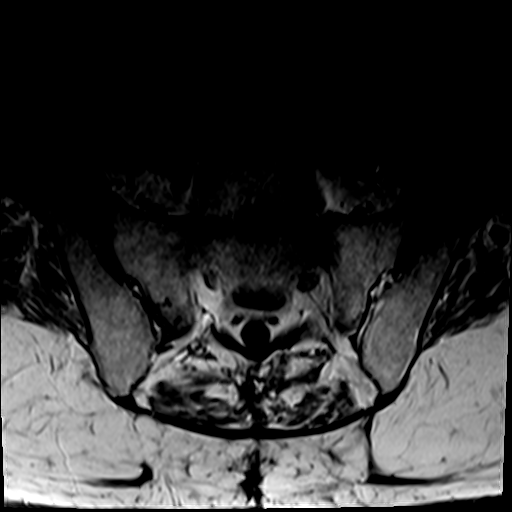
[im 15/29]
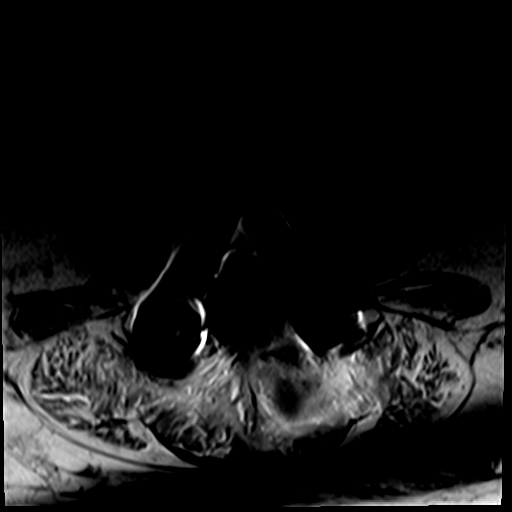
[im 29/29]
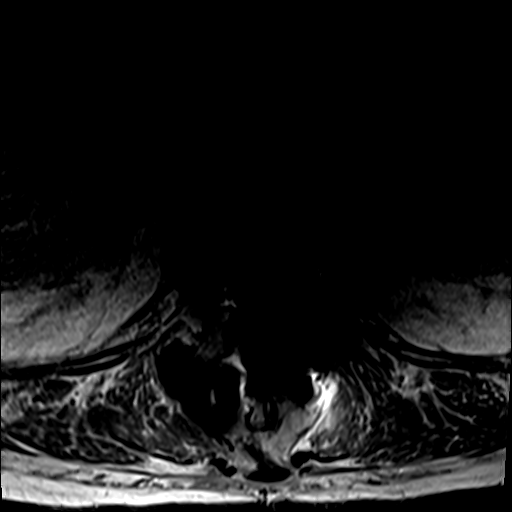

[23 of 48 positions shown; findings below may reference images not displayed]

FINDINGS: MRI CERVICAL SPINE FINDINGS

Significantly motion limited study.  Within this limitation:

Alignment: Similar alignment with reversal of the normal cervical
lordosis and grade 1 anterolisthesis of C3 on C4, C4 on C5, and C6
on C7.

Vertebrae: Motion limited evaluation. Degenerative/discogenic
endplate signal changes at multiple levels. Otherwise, no obvious
marrow edema to suggest acute fracture or discitis/osteomyelitis.

Cord: Motion limited evaluation without definite cord signal
abnormality.

Posterior Fossa, vertebral arteries, paraspinal tissues: Visualized
vertebral artery flow voids are maintained. No acute findings in the
visualized posterior fossa. There is nonspecific ill-defined edema
in the posterior paraspinal soft tissues.

Disc levels:

C2-C3: No significant disc protrusion, foraminal stenosis, or canal
stenosis.

C3-C4: Posterior disc osteophyte complex with left greater than
right facet and uncovertebral hypertrophy. Resulting moderate to
severe left foraminal stenosis and mild canal stenosis.

C4-C5: Posterior disc osteophyte complex with right greater than
left facet and uncovertebral hypertrophy. Resulting severe right
foraminal stenosis and moderate canal stenosis. Flattening of the
ventral cord.

C5-C6: Posterior disc osteophyte complex with mild bilateral facet
and uncovertebral hypertrophy. Mild canal stenosis without
significant foraminal stenosis.

C6-C7: Posterior disc osteophyte complex with central inferiorly
dissecting disc protrusion versus calcified PLL (as seen on prior
myelogram). Resulting moderate canal stenosis. Bilateral facet and
uncovertebral hypertrophy with mild to moderate bilateral foraminal
stenosis.

C7-T1: Posterior disc osteophyte complex with left greater than
right facet and uncovertebral hypertrophy. Central disc protrusion
with inferior extension. Resulting mild-to-moderate canal stenosis
and mild bilateral foraminal stenosis.

MRI THORACIC SPINE FINDINGS

Alignment:  Normal.

Vertebrae: Interval posterior fusion spanning from T10 inferiorly
into the lumbar spine. Nonspecific mild edema within the disc spaces
at T10-T11, T11-T12, and T12-L1.

Cord:  Normal cord signal.

Paraspinal and other soft tissues: Postoperative fluid collection in
the posterior paraspinal soft tissues in the lower thoracic spine
and lumbar spine (detailed further below).

Disc levels:

Mild

Mild disc bulging at multiple upper to mid thoracic levels. At T7-T8
right paracentral disc protrusion flattens the ventral cord with
mild canal stenosis, similar to prior. At T8-T9, disc bulging and
endplate spurring results in mild canal stenosis, similar. At
T9-T10, evaluation is limited by metallic artifact; however,
posterior disc bulging appears to result in mild to moderate canal
stenosis, possibly progressed from the prior but poorly
characterized. Interval extension of the patient's lumbar fusion
superiorly to T10. At T10-T11, the canal appears to be mild to
moderately moderately narrowed, similar versus slightly progressed.
At T11-T12 the canal appears to be patent.

MRI LUMBAR SPINE FINDINGS

Motion limited evaluation.  Within this limitation:

Segmentation:  Standard.

Alignment:  Normal.

Vertebrae:  Interval posterior fusion spanning from T10 to S1.

Conus medullaris and cauda equina: Conus extends to the L1-L2 level.
Conus appears normal.

Paraspinal and other soft tissues: Postoperative edema in the
paraspinal soft tissues with fluid collection in the paraspinal soft
tissues measuring up to 8 x 3 x 63 mm, centered at T12-L1. There is
also nonspecific fluid surrounding the posterior elements
bilaterally at L2-L3.

Disc levels:

T12-L1: Posterior fusion. Mild canal stenosis. Limited evaluation of
foramina due to metallic artifact.

L1-L2: Posterior fusion and decompression. Improved canal stenosis
with patent canal. Limited evaluation of the foramina due to
metallic artifact with visualized portions appearing patent.

L2-L3: Prior posterior decompression and fusion. Osteophytic
ridging. Facet hypertrophy. Mild canal stenosis, similar. Limited
evaluation of foramina with likely mild bilateral foraminal
stenosis.

L3-L4: Prior posterior fusion and decompression. Patent canal and
foramina.

L4-L5: Prior posterior fusion and decompression. Patent canal with
probably mild bilateral foraminal stenosis.

L5-S1: Prior posterior fusion and decompression. Patent canal with
mild left foraminal stenosis.
IMPRESSION: MRI cervical spine:

1. Similar moderate canal stenosis at C4-C5 and C6-C7
2. Similar severe foraminal stenosis on the right at C4-C5 and
moderate to severe foraminal stenosis on the left at C3-C4.
3. Similar inferiorly dissecting disc protrusion versus ossified PLL
behind the C7 vertebral body with mild-to-moderate canal stenosis at
C7-T1.
4. Nonspecific ill-defined edema in the posterior paraspinal soft
tissues, which could represent strain or cellulitis.

MRI thoracic and lumbar spine:

1. Status post extension of lumbar fusion superiorly to T10.
Nonspecific postoperative edema and fluid collection in the
posterior paraspinal soft tissues centered at T12, which could
represent a seroma but abscess is not excluded given reported
purulent drainage. There also is nonspecific fluid surrounding the
posterior elements at L2-L3.
2. Mild edema within the discs at T10-T11 and T12-L1, which could be
degenerative or stress related but early discitis is difficult to
exclude given the clinical history. No clearly infectious findings
within the canal, although evaluation is limited by metallic
artifact and motion. If there is clinical concern for infection,
consider correlation with inflammatory markers and close interval
follow-up MRI with contrast as clinically indicated.
3. Improved canal stenosis and foraminal stenosis at L1-L2 status
post decompression and fusion.
4. Mild to moderate canal stenosis at T9-T10 and T10-T11 appears
similar versus slightly progressed. Similar mild canal stenosis at
T7-T8.

## 2020-11-26 IMAGING — MR MR CERVICAL SPINE W/O CM
13 of 22 series · 23 of 48 positions shown · IV contrast (gadavist)
Comparison: CT myelogram [DATE].

CLINICAL DATA: Spinal fusion, thoracic, follow up Epidural abscess;
Spinal fusion, lumbosacral, follow up

EXAM:
MRI CERVICAL, THORACIC AND LUMBAR SPINE WITHOUT AND WITH CONTRAST
TECHNIQUE: Multiplanar and multiecho pulse sequences of the cervical spine, to
include the craniocervical junction and cervicothoracic junction,
and thoracic and lumbar spine, were obtained without and with
intravenous contrast.
CONTRAST:  10mL GADAVIST GADOBUTROL 1 MMOL/ML IV SOLN

[Series 7: STIR · sagittal · 3.0mm · 0.86mm/px · 2 of 15 slices shown]
[im 1/15]
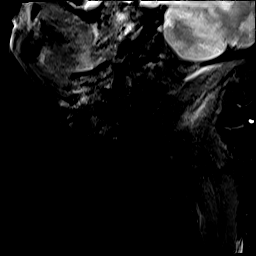
[im 15/15]
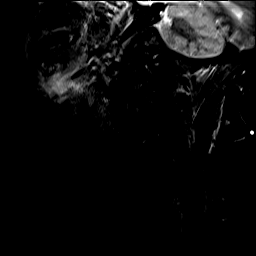

[Series 21: T1 · sagittal · 5.0mm · 1.76mm/px · 1 of 9 slices shown (1 of 6)]
[im 1/9]
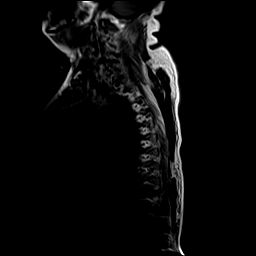

[Series 22: T2 · sagittal · 4.0mm · 1.25mm/px · 1 of 13 slices shown (1 of 3)]
[im 1/13]
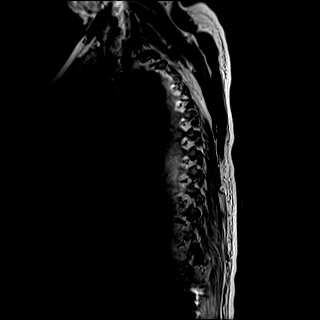

[Series 24: T1 · sagittal · 4.0mm · 1.56mm/px · 1 of 13 slices shown (2 of 6)]
[im 1/13]
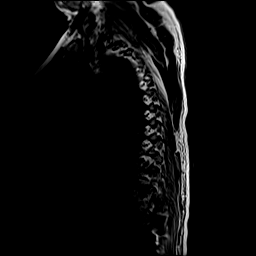

[Series 25: T1 fat-sat post-contrast · sagittal · 4.0mm · 1.56mm/px · 1 of 13 slices shown (1 of 3)]
[im 1/13]
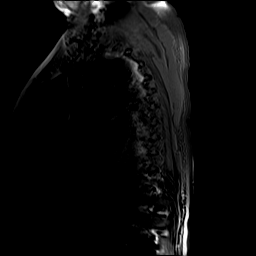

[Series 28: T1 · axial · non-contrast · 4.0mm · 0.35mm/px · z∈[-346,-118]mm · 4 of 36 slices shown (3 of 6)]
[im 1/36]
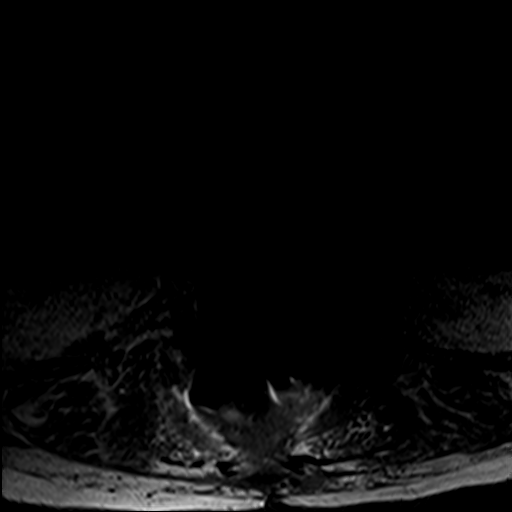
[im 12/36]
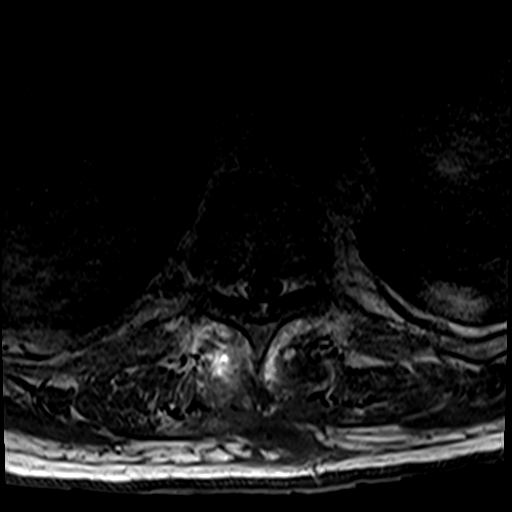
[im 24/36]
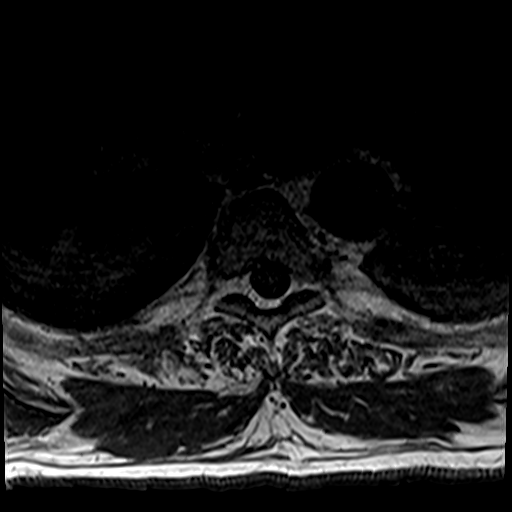
[im 36/36]
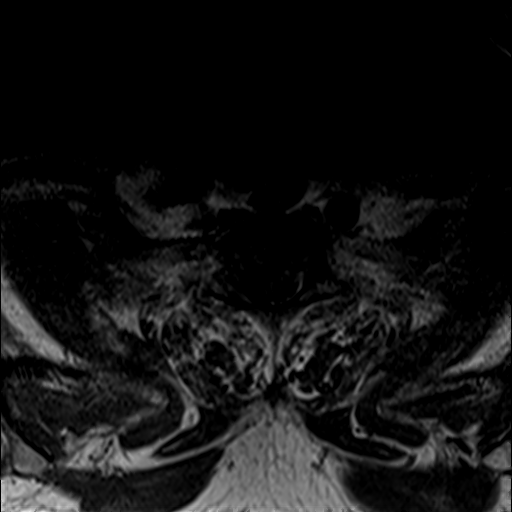

[Series 29: T2 · sagittal · 4.0mm · 0.81mm/px · 1 of 13 slices shown (2 of 3)]
[im 1/13]
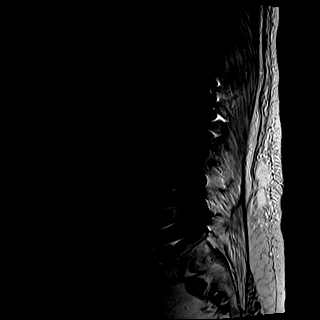

[Series 31: T1 · sagittal · 4.0mm · 1.02mm/px · 1 of 13 slices shown (4 of 6)]
[im 1/13]
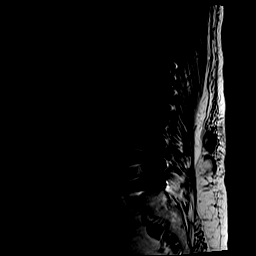

[Series 32: T2 · axial · 4.0mm · 0.70mm/px · z∈[-527,-336]mm · 3 of 29 slices shown (3 of 3)]
[im 1/29]
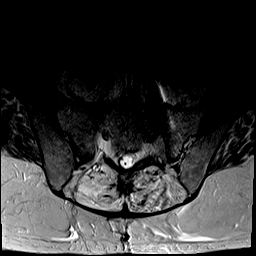
[im 15/29]
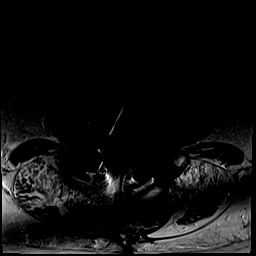
[im 29/29]
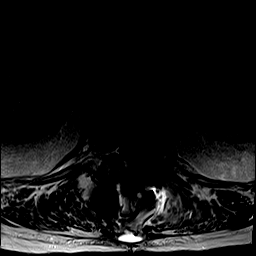

[Series 33: T1 · axial · 4.0mm · 0.35mm/px · z∈[-527,-336]mm · 3 of 29 slices shown (5 of 6)]
[im 1/29]
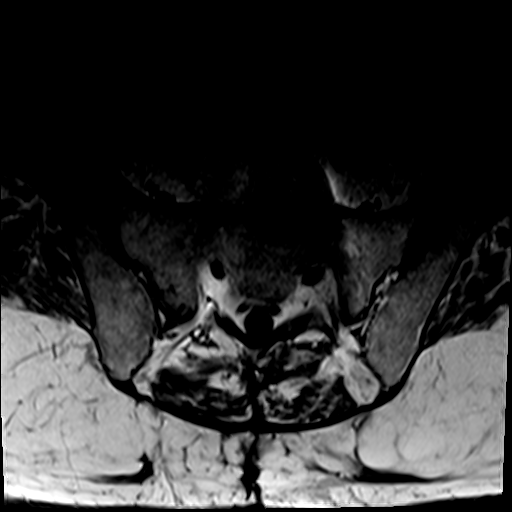
[im 15/29]
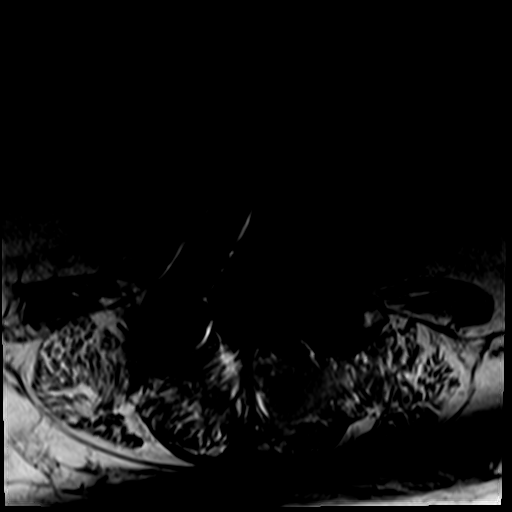
[im 29/29]
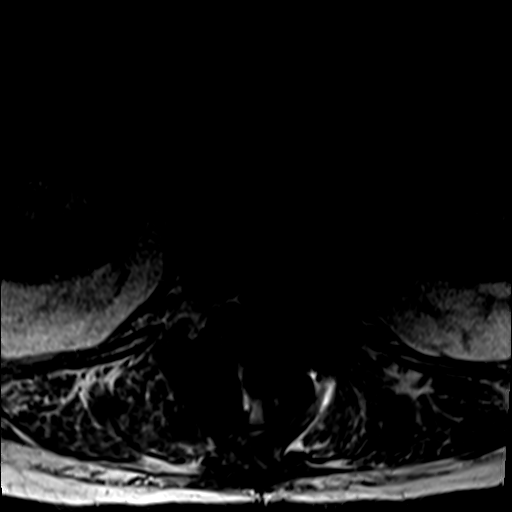

[Series 34: T1 fat-sat post-contrast · sagittal · 4.0mm · 1.56mm/px · 1 of 13 slices shown (2 of 3)]
[im 1/13]
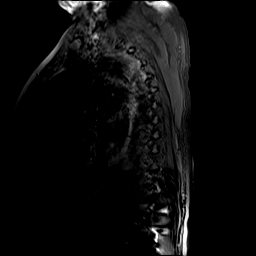

[Series 36: T1 fat-sat post-contrast · sagittal · 4.0mm · 1.02mm/px · 1 of 13 slices shown (3 of 3)]
[im 1/13]
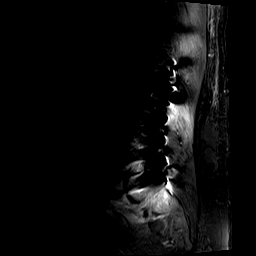

[Series 37: T1 · axial · 4.0mm · 0.35mm/px · z∈[-527,-336]mm · 3 of 29 slices shown (6 of 6)]
[im 1/29]
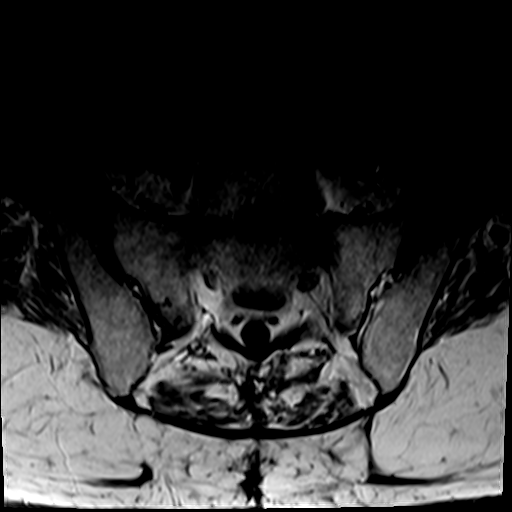
[im 15/29]
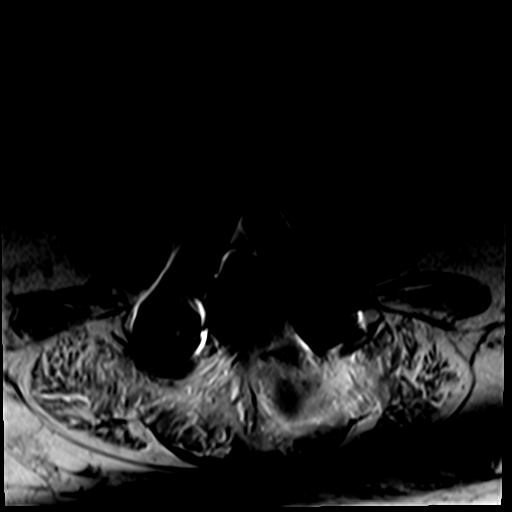
[im 29/29]
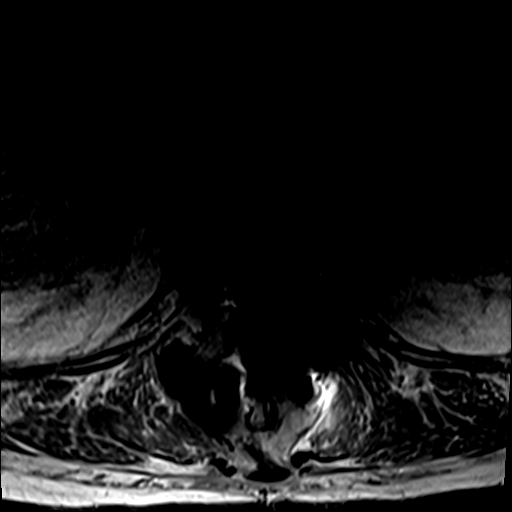

[23 of 48 positions shown; findings below may reference images not displayed]

FINDINGS: MRI CERVICAL SPINE FINDINGS

Significantly motion limited study.  Within this limitation:

Alignment: Similar alignment with reversal of the normal cervical
lordosis and grade 1 anterolisthesis of C3 on C4, C4 on C5, and C6
on C7.

Vertebrae: Motion limited evaluation. Degenerative/discogenic
endplate signal changes at multiple levels. Otherwise, no obvious
marrow edema to suggest acute fracture or discitis/osteomyelitis.

Cord: Motion limited evaluation without definite cord signal
abnormality.

Posterior Fossa, vertebral arteries, paraspinal tissues: Visualized
vertebral artery flow voids are maintained. No acute findings in the
visualized posterior fossa. There is nonspecific ill-defined edema
in the posterior paraspinal soft tissues.

Disc levels:

C2-C3: No significant disc protrusion, foraminal stenosis, or canal
stenosis.

C3-C4: Posterior disc osteophyte complex with left greater than
right facet and uncovertebral hypertrophy. Resulting moderate to
severe left foraminal stenosis and mild canal stenosis.

C4-C5: Posterior disc osteophyte complex with right greater than
left facet and uncovertebral hypertrophy. Resulting severe right
foraminal stenosis and moderate canal stenosis. Flattening of the
ventral cord.

C5-C6: Posterior disc osteophyte complex with mild bilateral facet
and uncovertebral hypertrophy. Mild canal stenosis without
significant foraminal stenosis.

C6-C7: Posterior disc osteophyte complex with central inferiorly
dissecting disc protrusion versus calcified PLL (as seen on prior
myelogram). Resulting moderate canal stenosis. Bilateral facet and
uncovertebral hypertrophy with mild to moderate bilateral foraminal
stenosis.

C7-T1: Posterior disc osteophyte complex with left greater than
right facet and uncovertebral hypertrophy. Central disc protrusion
with inferior extension. Resulting mild-to-moderate canal stenosis
and mild bilateral foraminal stenosis.

MRI THORACIC SPINE FINDINGS

Alignment:  Normal.

Vertebrae: Interval posterior fusion spanning from T10 inferiorly
into the lumbar spine. Nonspecific mild edema within the disc spaces
at T10-T11, T11-T12, and T12-L1.

Cord:  Normal cord signal.

Paraspinal and other soft tissues: Postoperative fluid collection in
the posterior paraspinal soft tissues in the lower thoracic spine
and lumbar spine (detailed further below).

Disc levels:

Mild

Mild disc bulging at multiple upper to mid thoracic levels. At T7-T8
right paracentral disc protrusion flattens the ventral cord with
mild canal stenosis, similar to prior. At T8-T9, disc bulging and
endplate spurring results in mild canal stenosis, similar. At
T9-T10, evaluation is limited by metallic artifact; however,
posterior disc bulging appears to result in mild to moderate canal
stenosis, possibly progressed from the prior but poorly
characterized. Interval extension of the patient's lumbar fusion
superiorly to T10. At T10-T11, the canal appears to be mild to
moderately moderately narrowed, similar versus slightly progressed.
At T11-T12 the canal appears to be patent.

MRI LUMBAR SPINE FINDINGS

Motion limited evaluation.  Within this limitation:

Segmentation:  Standard.

Alignment:  Normal.

Vertebrae:  Interval posterior fusion spanning from T10 to S1.

Conus medullaris and cauda equina: Conus extends to the L1-L2 level.
Conus appears normal.

Paraspinal and other soft tissues: Postoperative edema in the
paraspinal soft tissues with fluid collection in the paraspinal soft
tissues measuring up to 8 x 3 x 63 mm, centered at T12-L1. There is
also nonspecific fluid surrounding the posterior elements
bilaterally at L2-L3.

Disc levels:

T12-L1: Posterior fusion. Mild canal stenosis. Limited evaluation of
foramina due to metallic artifact.

L1-L2: Posterior fusion and decompression. Improved canal stenosis
with patent canal. Limited evaluation of the foramina due to
metallic artifact with visualized portions appearing patent.

L2-L3: Prior posterior decompression and fusion. Osteophytic
ridging. Facet hypertrophy. Mild canal stenosis, similar. Limited
evaluation of foramina with likely mild bilateral foraminal
stenosis.

L3-L4: Prior posterior fusion and decompression. Patent canal and
foramina.

L4-L5: Prior posterior fusion and decompression. Patent canal with
probably mild bilateral foraminal stenosis.

L5-S1: Prior posterior fusion and decompression. Patent canal with
mild left foraminal stenosis.
IMPRESSION: MRI cervical spine:

1. Similar moderate canal stenosis at C4-C5 and C6-C7
2. Similar severe foraminal stenosis on the right at C4-C5 and
moderate to severe foraminal stenosis on the left at C3-C4.
3. Similar inferiorly dissecting disc protrusion versus ossified PLL
behind the C7 vertebral body with mild-to-moderate canal stenosis at
C7-T1.
4. Nonspecific ill-defined edema in the posterior paraspinal soft
tissues, which could represent strain or cellulitis.

MRI thoracic and lumbar spine:

1. Status post extension of lumbar fusion superiorly to T10.
Nonspecific postoperative edema and fluid collection in the
posterior paraspinal soft tissues centered at T12, which could
represent a seroma but abscess is not excluded given reported
purulent drainage. There also is nonspecific fluid surrounding the
posterior elements at L2-L3.
2. Mild edema within the discs at T10-T11 and T12-L1, which could be
degenerative or stress related but early discitis is difficult to
exclude given the clinical history. No clearly infectious findings
within the canal, although evaluation is limited by metallic
artifact and motion. If there is clinical concern for infection,
consider correlation with inflammatory markers and close interval
follow-up MRI with contrast as clinically indicated.
3. Improved canal stenosis and foraminal stenosis at L1-L2 status
post decompression and fusion.
4. Mild to moderate canal stenosis at T9-T10 and T10-T11 appears
similar versus slightly progressed. Similar mild canal stenosis at
T7-T8.

## 2020-11-26 MED ORDER — PANTOPRAZOLE SODIUM 40 MG IV SOLR
40.0000 mg | INTRAVENOUS | Status: DC
  Administered 2020-11-26 – 2020-12-01 (×6): 40 mg via INTRAVENOUS
  Filled 2020-11-26 (×6): qty 40

## 2020-11-26 MED ORDER — DILTIAZEM HCL 25 MG/5ML IV SOLN
10.0000 mg | Freq: Once | INTRAVENOUS | Status: AC
  Administered 2020-11-26: 10 mg via INTRAVENOUS
  Filled 2020-11-26: qty 5

## 2020-11-26 MED ORDER — GADOBUTROL 1 MMOL/ML IV SOLN
10.0000 mL | Freq: Once | INTRAVENOUS | Status: AC | PRN
  Administered 2020-11-26: 10 mL via INTRAVENOUS

## 2020-11-26 MED ORDER — INSULIN ASPART 100 UNIT/ML IJ SOLN
0.0000 [IU] | INTRAMUSCULAR | Status: DC
  Administered 2020-11-26: 1 [IU] via SUBCUTANEOUS
  Administered 2020-11-27 (×2): 2 [IU] via SUBCUTANEOUS
  Administered 2020-11-27 (×2): 1 [IU] via SUBCUTANEOUS
  Administered 2020-11-28 (×2): 2 [IU] via SUBCUTANEOUS

## 2020-11-26 MED ORDER — METOPROLOL TARTRATE 5 MG/5ML IV SOLN
5.0000 mg | Freq: Four times a day (QID) | INTRAVENOUS | Status: DC
  Administered 2020-11-26 (×3): 5 mg via INTRAVENOUS
  Filled 2020-11-26 (×3): qty 5

## 2020-11-26 MED ORDER — LORAZEPAM 2 MG/ML IJ SOLN
0.5000 mg | Freq: Three times a day (TID) | INTRAMUSCULAR | Status: DC | PRN
  Administered 2020-11-26 – 2020-12-06 (×10): 0.5 mg via INTRAVENOUS
  Filled 2020-11-26 (×11): qty 1

## 2020-11-26 MED ORDER — INSULIN ASPART 100 UNIT/ML IJ SOLN: 0.0000 [IU] | INTRAMUSCULAR | Status: DC

## 2020-11-26 MED ORDER — PHENYTOIN SODIUM 50 MG/ML IJ SOLN
100.0000 mg | Freq: Three times a day (TID) | INTRAMUSCULAR | Status: DC
  Administered 2020-11-26 – 2020-11-30 (×12): 100 mg via INTRAVENOUS
  Filled 2020-11-26 (×12): qty 2

## 2020-11-26 NOTE — Progress Notes (Signed)
Patient voided in urinal and post void bladder scan done. Urine sample obtained and sent to lab. Dr Wynetta Emery made aware and wants [place] to continue to monitor output.

## 2020-11-26 NOTE — Progress Notes (Signed)
Patient received in bed, VSS, A&Ox4 no acute distress noticed, connected to low intermittent suction into his left NG tube. Will continue to monitor.

## 2020-11-26 NOTE — Progress Notes (Signed)
Medication consultation: Phenytoin PO to IV   The patient Danny Hampton consult for phenytoin PO to IV  Other pertinent lab values include: No components found for: LABALBU Creatinine, Ser  Date Value Ref Range Status  11/26/2020 1.36 (H) 0.61 - 1.24 mg/dL Final    A/P Patient presented to ED with constipation.  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 His abdomen is distended. In addition, patient with MSSA bacteremia. Patient is NPO and Pharmacy asked to changed to IV. Change Dilantin to 100mg  IV q8h F/U and Monitor labs as indicated  Isac Sarna, BS [place] [place], BCPS Clinical Pharmacist Pager [phone]

## 2020-11-26 NOTE — Progress Notes (Signed)
PT Cancellation Note  Patient Details Name: Gildardo Tickner MRN: [id number] DOB: 02/07/1956   Cancelled Treatment:    Reason Eval/Treat Not Completed: Other (comment) (rectal tube placed, waiting for transfer to [facility]). Pt with rectal tube placement this morning and waiting for transfer to [facility]. Requesting we check back for therapy later today if pt still present or hold until tomorrow.     Tori Joesphine Schemm PT, DPT 11/26/20, 10:15 AM

## 2020-11-26 NOTE — Progress Notes (Signed)
[facility]  Distended. Ileus with bacteremia, could be getting some Ogilvie's given the colon distention too.   Red rubber rectal tube inserted to see if any relief. Will obtain another KUB. Rectal tube to remain in for now and see if any relief.  RN and Dr. Wynetta Emery aware. Hold po meds. Full consult to follow.  Curlene Labrum, MD [facility] [address] [place], [place] [address] [phone] (office)

## 2020-11-26 NOTE — Progress Notes (Addendum)
PROGRESS NOTE   Danny Hampton  [id number] DOB: 02-24-1956 DOA: 11/23/2020 PCP: [facility]   Chief Complaint  Patient presents with   Constipation   Level of care: Progressive  Brief Admission History:  65 y.o. adult, with history of acid reflux, dysrhythmia, essential hypertension, high cholesterol, myotonia congenita, type 2 diabetes mellitus presents to the ED with a chief complaint of abdominal pain.  Patient reports that he feels like there is a blockage in his bowels.  He reports abdominal distention and a pain that he describes as pressure.  He reports that he had a back surgery in August 24, the pain started shortly after that.  He reports that after previous back surgery he developed an ileus, and he thought that is what was going on this time.  Patient was told that if he was producing flatulence, not to worry about an ileus.  He reports he has been producing flatulence.  Patient was seen yesterday at [facility], and sent home with a container of GoLytely.  Patient reports he drank the whole thing, but still feels like there is something wrong.  The pain is mostly in the right upper quadrant and the lower ribs.  He reports it is worse when he takes a deep breath.  He reports that he tries not to cough because that is painful.  Patient is not on any oxygen at baseline, and is requiring oxygen in the ER today 2 L nasal cannula.  Patient has no history of blood clot.  Patient reports that he has had multiple loose stools today as expected after GoLytely.  His last normal bowel movement was 2 days ago.  He reports that his last normal meal was 3 days prior to arrival.  Pt noted to have purulent drainage from thoracic insertion site and positive blood cultures 4/4 for MSSA.  He is being transferred to [facility] after speaking with his neurosurgery team Dr. Saintclair Halsted who will see him. They recommended obtaining MRI with and without contrast of T/L spine.     Assessment & Plan:    Principal Problem:   Staphylococcus aureus bacteremia Active Problems:   Postoperative wound infection   Ileus, postoperative (HCC)   Acute exacerbation of chronic low back pain   Acute respiratory failure with hypoxia (HCC)   GERD (gastroesophageal reflux disease)   Myotonia congenita   Hyperlipidemia   Intractable abdominal pain   Abdominal distension   Generalized abdominal pain  MSSA bacteremia - Following CRP and Sed rate as requested by neurosurgery team - Cefazolin IV ordered per ID team  - TTE ordered and unrevealing, He will need TEE completed when medically stabilized  - Repeated blood cultures ordered 9/18 no growth to date -- follow  - neurosurgery was consulted and recommended transfer to [facility].  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 Constance Haw consulted, recommended keep NPO, hold oral meds for now  - rectal tube placed by Dr. Constance Haw on 9/19.    Postoperative wound infection - Purulent fluid coming from thoracic wound incision - wound culture sent to lab on 9/18 suspect MSSA - continue IV cefazolin  - Neurosurgery consultation at [facility] - MRI T/L spine ordered with and without contrast  Acute respiratory failure with hypoxia  - Pt initially placed on high flow nasal cannula  - likely exacerbated by  increasing abdominal distension and ileus - wean oxygen as able   Abdominal pain / distension  - secondary to ileus - IV and oral pain management ordered  Type 2 DM with neurological complications - continue SSI coverage and frequent CBG monitoring CBG (last 3)  Recent Labs    11/25/20 1643 11/25/20 2131 11/26/20 0717  GLUCAP 136* 154* 152*   Myotonia congenita  - He is on carbidopa/levodopa  Hyperlidemia  - resume home statin   DVT prophylaxis: heparin / SCDs Code Status: Full   Family Communication: sig other at bedside 9/19  Disposition: transfer [facility] to [facility]  Status is: Inpatient  Remains inpatient appropriate because:Inpatient level of care appropriate due to severity of illness  Dispo: The patient is from: Home              Anticipated d/c is to: Home              Patient currently is not medically stable to d/c.   Difficult to place patient No  Consultants:  Neurosurgery Meyran Saintclair Halsted) 9/18  Procedures:  N/a  Antimicrobials:  Zosyn 9/17-9/18 Cefazolin 9/18>>   Subjective: Pt developed increasing abdominal distension overnight persistent low back pain and discomfort, abdominal pain increasing, feels miserable, NG tube helped a little bit.     Objective: Vitals:   11/26/20 0500 11/26/20 0600 11/26/20 0607 11/26/20 0700  BP: (!) 156/112 (!) 138/104 (!) 139/100   Pulse: (!) 101 100    Resp: 11 (!) 6    Temp:    97.6 F (36.4 C)  TempSrc:    Axillary  SpO2: 94% 94%    Weight:      Height:        Intake/Output Summary (Last 24 hours) at 11/26/2020 0828 Last data filed at 11/26/2020 0622 Gross per 24 hour  Intake 2744.83 ml  Output 700 ml  Net 2044.83 ml   Filed Weights   11/24/20 1505 11/25/20 0421 11/26/20 0411  Weight: 90.9 kg 85.5 kg 95.7 kg   Examination:  General exam: Appears to be in moderate to severe pain, lying supine in bed. Grimacing with pain.   Respiratory system: shallow BS bilateral but clear. Cardiovascular system: normal S1 & S2 heard. No JVD, murmurs, rubs, gallops or clicks. No pedal edema. Gastrointestinal system: Abdomen is markedly more distended, tense with diffuse tenderness.  No organomegaly or masses felt. Hypoactive to absent bowel sounds.   Spinal exam: purulent drainage from thoracic wound seen posteriorly with surrounding erythema.  Central nervous system: Alert and oriented. No focal neurological deficits. Extremities: Symmetric 5 x 5 power. Skin: No rashes, lesions or  ulcers Psychiatry: Judgement and insight appear normal. Mood & affect appropriate.   Data Reviewed: I have personally reviewed following labs and imaging studies  CBC: Recent Labs  Lab 11/23/20 2003 11/24/20 0546 11/25/20 0306 11/26/20 0454  WBC 14.9* 11.9* 6.8 6.4  NEUTROABS 13.3* 10.1* 6.1 5.3  HGB 12.4* 12.4* 9.6* 9.8*  HCT 36.3* 36.9* 28.1* 29.5*  MCV 93.8 96.1 92.7 95.5  PLT 196 143* 104* 83*    Basic Metabolic Panel: Recent Labs  Lab 11/23/20 2003 11/24/20 0546 11/25/20 0306 11/26/20 0454  NA 130* 130* 124* 124*  K 3.8 3.7 3.2* 4.5  CL 88* 89* 90* 91*  CO2 27 32 25 22  GLUCOSE 184* 175* 154* 138*  BUN '19 16 14 '$ 25*  CREATININE 0.90 0.78 0.79 1.36*  CALCIUM 8.9 8.6* 7.4* 7.6*  MG  --  1.5*  1.9  --     GFR: Estimated Creatinine Clearance (by C-G formula based on SCr of 1.36 mg/dL (H)) Male: 46.3 mL/min (A) Male: 56.5 mL/min (A)  Liver Function Tests: Recent Labs  Lab 11/23/20 2003 11/24/20 0546 11/25/20 0306 11/26/20 0454  AST 33 33 41 330*  ALT '20 17 9 13  '$ ALKPHOS 100 90 60 55  BILITOT 0.6 0.6 0.6 0.8  PROT 7.6 7.0 5.6* 6.4*  ALBUMIN 4.1 3.6 2.7* 2.8*    CBG: Recent Labs  Lab 11/25/20 0743 11/25/20 1059 11/25/20 1643 11/25/20 2131 11/26/20 0717  GLUCAP 173* 157* 136* 154* 152*    Recent Results (from the past 240 hour(s))  Resp Panel by RT-PCR (Flu A&B, Covid) Nasopharyngeal Swab     Status: None   Collection Time: 11/24/20 12:40 AM   Specimen: Nasopharyngeal Swab; Nasopharyngeal(NP) swabs in vial transport medium  Result Value Ref Range Status   SARS Coronavirus 2 by RT PCR NEGATIVE NEGATIVE Final    Comment: (NOTE) SARS-CoV-2 target nucleic acids are NOT DETECTED.  The SARS-CoV-2 RNA is generally detectable in upper respiratory specimens during the acute phase of infection. The lowest concentration of SARS-CoV-2 viral copies this assay can detect is 138 copies/mL. A negative result does not preclude SARS-Cov-2 infection and  should not be used as the sole basis for treatment or other patient management decisions. A negative result may occur with  improper specimen collection/handling, submission of specimen other than nasopharyngeal swab, presence of viral mutation(s) within the areas targeted by this assay, and inadequate number of viral copies(<138 copies/mL). A negative result must be combined with clinical observations, patient history, and epidemiological information. The expected result is Negative.  Fact Sheet for Patients:  EntrepreneurPulse.com.au  Fact Sheet for Healthcare Providers:  IncredibleEmployment.be  This test is no t yet approved or cleared by the [place] FDA and  has been authorized for detection and/or diagnosis of SARS-CoV-2 by FDA under an Emergency Use Authorization (EUA). This EUA will remain  in effect (meaning this test can be used) for the duration of the COVID-19 declaration under Section 564(b)(1) of the Act, 21 U.S.C.section 360bbb-3(b)(1), unless the authorization is terminated  or revoked sooner.       Influenza A by PCR NEGATIVE NEGATIVE Final   Influenza B by PCR NEGATIVE NEGATIVE Final    Comment: (NOTE) The Xpert Xpress SARS-CoV-2/FLU/RSV plus assay is intended as an aid in the diagnosis of influenza from Nasopharyngeal swab specimens and should not be used as a sole basis for treatment. Nasal washings and aspirates are unacceptable for Xpert Xpress SARS-CoV-2/FLU/RSV testing.  Fact Sheet for Patients: EntrepreneurPulse.com.au  Fact Sheet for Healthcare Providers: IncredibleEmployment.be  This test is not yet approved or cleared by the [place] FDA and has been authorized for detection and/or diagnosis of SARS-CoV-2 by FDA under an Emergency Use Authorization (EUA). This EUA will remain in effect (meaning this test can be used) for the duration of the COVID-19 declaration  under Section 564(b)(1) of the Act, 21 U.S.C. section 360bbb-3(b)(1), unless the authorization is terminated or revoked.  Performed at [facility], [address], [place], [place] [address]   Culture, blood (routine x 2)     Status: None (Preliminary result)   Collection Time: 11/24/20  8:15 AM   Specimen: BLOOD LEFT HAND  Result Value Ref Range Status   Specimen Description   Final    BLOOD LEFT HAND BOTTLES DRAWN AEROBIC AND ANAEROBIC Performed at [facility], [address],  [place], [place] [address]    Special Requests   Final    Blood Culture adequate volume Performed at [facility], [address], [place], [place] [address]    Culture  Setup Time   Final    GRAM POSITIVE COCCI IN BOTH AEROBIC AND ANAEROBIC BOTTLES CRITICAL RESULT CALLED TO, READ BACK BY AND VERIFIED WITH: PHARM D L.POOLE ON [id number] AT 0818 BY E.PARRISH Performed at [facility], [place] [address] [place], [place] [address]    Culture GRAM POSITIVE COCCI  Final   Report Status PENDING  Incomplete  Blood Culture ID Panel (Reflexed)     Status: Abnormal   Collection Time: 11/24/20  8:15 AM  Result Value Ref Range Status   Enterococcus faecalis NOT DETECTED NOT DETECTED Final   Enterococcus Faecium NOT DETECTED NOT DETECTED Final   Listeria monocytogenes NOT DETECTED NOT DETECTED Final   Staphylococcus species DETECTED (A) NOT DETECTED Final    Comment: CRITICAL RESULT CALLED TO, READ BACK BY AND VERIFIED WITH: PHARM D L.POOLE ON [id number] AT 0818 BY E.PARRISH    Staphylococcus aureus (BCID) DETECTED (A) NOT DETECTED Final    Comment: CRITICAL RESULT CALLED TO, READ BACK BY AND VERIFIED WITH: PHARM D L.POOLE ON [id number] AT 0818 BY E.PARRISH    Staphylococcus epidermidis NOT DETECTED NOT DETECTED Final   Staphylococcus lugdunensis NOT DETECTED NOT DETECTED Final   Streptococcus species NOT DETECTED NOT DETECTED Final   Streptococcus agalactiae NOT DETECTED NOT DETECTED Final   Streptococcus  pneumoniae NOT DETECTED NOT DETECTED Final   Streptococcus pyogenes NOT DETECTED NOT DETECTED Final   A.calcoaceticus-baumannii NOT DETECTED NOT DETECTED Final   Bacteroides fragilis NOT DETECTED NOT DETECTED Final   Enterobacterales NOT DETECTED NOT DETECTED Final   Enterobacter cloacae complex NOT DETECTED NOT DETECTED Final   Escherichia coli NOT DETECTED NOT DETECTED Final   Klebsiella aerogenes NOT DETECTED NOT DETECTED Final   Klebsiella oxytoca NOT DETECTED NOT DETECTED Final   Klebsiella pneumoniae NOT DETECTED NOT DETECTED Final   Proteus species NOT DETECTED NOT DETECTED Final   Salmonella species NOT DETECTED NOT DETECTED Final   Serratia marcescens NOT DETECTED NOT DETECTED Final   Haemophilus influenzae NOT DETECTED NOT DETECTED Final   Neisseria meningitidis NOT DETECTED NOT DETECTED Final   Pseudomonas aeruginosa NOT DETECTED NOT DETECTED Final   Stenotrophomonas maltophilia NOT DETECTED NOT DETECTED Final   Candida albicans NOT DETECTED NOT DETECTED Final   Candida auris NOT DETECTED NOT DETECTED Final   Candida glabrata NOT DETECTED NOT DETECTED Final   Candida krusei NOT DETECTED NOT DETECTED Final   Candida parapsilosis NOT DETECTED NOT DETECTED Final   Candida tropicalis NOT DETECTED NOT DETECTED Final   Cryptococcus neoformans/gattii NOT DETECTED NOT DETECTED Final   Meth resistant mecA/C and MREJ NOT DETECTED NOT DETECTED Final    Comment: Performed at [facility] [place] [facility], [address], [place], [place] [address]  Culture, blood (routine x 2)     Status: None (Preliminary result)   Collection Time: 11/24/20  8:16 AM   Specimen: BLOOD RIGHT HAND  Result Value Ref Range Status   Specimen Description   Final    BLOOD RIGHT HAND BOTTLES DRAWN AEROBIC AND ANAEROBIC Performed at [facility], [address] [place], [place] [address]    Special Requests   Final    Blood Culture adequate volume Performed at [facility], [address], [place], [place] [address]     Culture  Setup Time   Final  GRAM POSITIVE COCCI IN BOTH AEROBIC AND ANAEROBIC BOTTLES CRITICAL VALUE NOTED.  VALUE IS CONSISTENT WITH PREVIOUSLY REPORTED AND CALLED VALUE. Performed at [facility], [place] [address] [place], [place] [address]    Culture   Final    NO GROWTH < 24 HOURS Performed at [facility], [address] [place], [place] [address]    Report Status PENDING  Incomplete  MRSA Next Gen by PCR, Nasal     Status: None   Collection Time: 11/24/20  3:30 PM   Specimen: Nasal Mucosa; Nasal Swab  Result Value Ref Range Status   MRSA by PCR Next Gen NOT DETECTED NOT DETECTED Final    Comment: (NOTE) The GeneXpert MRSA Assay (FDA approved for NASAL specimens only), is one component of a comprehensive MRSA colonization surveillance program. It is not intended to diagnose MRSA infection nor to guide or monitor treatment for MRSA infections. Test performance is not FDA approved in patients less than 31 years old. Performed at [facility], [address] [place], [place] [address]   Aerobic Culture w Gram Stain (superficial specimen)     Status: None (Preliminary result)   Collection Time: 11/25/20 11:00 AM   Specimen: Back; Wound  Result Value Ref Range Status   Specimen Description   Final    BACK Performed at [facility], [address] [place], [place] [address]    Special Requests   Final    Normal Performed at [facility], [address], [place], [place] [address]    Gram Stain   Final    NO WBC SEEN FEW GRAM POSITIVE COCCI Performed at [facility], [place] [address], [place], [place] [address]    Culture PENDING  Incomplete   Report Status PENDING  Incomplete  Culture, blood (routine x 2)     Status: None (Preliminary result)   Collection Time: 11/25/20  3:42 PM   Specimen: BLOOD LEFT FOREARM  Result Value Ref Range Status   Specimen Description   Final    BLOOD LEFT FOREARM BOTTLES DRAWN AEROBIC AND ANAEROBIC   Special Requests   Final     Blood Culture adequate volume Performed at [facility], [address], [place],  [address]    Culture PENDING  Incomplete   Report Status PENDING  Incomplete  Culture, blood (routine x 2)     Status: None (Preliminary result)   Collection Time: 11/25/20  3:55 PM   Specimen: Right Antecubital; Blood  Result Value Ref Range Status   Specimen Description   Final    RIGHT ANTECUBITAL BOTTLES DRAWN AEROBIC AND ANAEROBIC   Special Requests   Final    Blood Culture adequate volume Performed at [facility], [address] [place],  [address]    Culture PENDING  Incomplete   Report Status PENDING  Incomplete     Radiology Studies: DG Abd 1 View  Result Date: 11/25/2020 CLINICAL DATA:  NG tube placement. EXAM: ABDOMEN - 1 VIEW COMPARISON:  Abdominal x-ray 11/25/2020. FINDINGS: Nasogastric tube tip is in the proximal stomach, just beyond the gastroesophageal junction. Again seen is diffuse gaseous distension of the colon. This is slightly decreased when compared to the prior study. Air seen within central nondilated small bowel. The visualized lung bases are clear. Thoracolumbar fusion hardware is again seen. IMPRESSION: 1. Nasogastric tube tip is in the proximal stomach just beyond the gastroesophageal junction. Recommend advancing tube. 2. Gaseous distension of the entire colon is again seen. Degree of  distention has slightly improved. Continued follow-up recommended. Electronically Signed   By: Ronney Asters M.D.   On: 11/25/2020 21:09   DG CHEST PORT 1 VIEW  Result Date: 11/25/2020 CLINICAL DATA:  Tachypnea. EXAM: PORTABLE CHEST 1 VIEW COMPARISON:  April 20, 2020 FINDINGS: No pneumothorax. The low volume portable technique limits evaluation. No focal infiltrates or overt edema. The cardiomediastinal silhouette is stable. No other acute abnormalities. IMPRESSION: No acute abnormalities are identified. The study is limited due to the low volume portable technique.  Electronically Signed   By: Dorise Bullion III M.D.   On: 11/25/2020 08:15   DG Abd Portable 1V  Result Date: 11/25/2020 CLINICAL DATA:  Continued abdominal pain. EXAM: PORTABLE ABDOMEN - 1 VIEW COMPARISON:  CT scan of the abdomen November 23, 2020 FINDINGS: Pedicle rods and screws are seen from the lower thoracic spine through the sacrum. Lung bases are poorly visualized but unremarkable. No free air, portal venous gas, or pneumatosis. The colon is air-filled and prominent. Colonic gas extends to the level the rectum. The dilated small bowel loops on the recent CT scan are not as well visualized on this study. IMPRESSION: 1. Prominent air-filled colon with colonic gas extending to the level of the rectum. The findings suggest ileus. Previously identified dilated loops of small bowel are not as well visualized on this study. No other abnormalities. Electronically Signed   By: Dorise Bullion III M.D.   On: 11/25/2020 08:18   ECHOCARDIOGRAM COMPLETE  Result Date: 11/25/2020    ECHOCARDIOGRAM REPORT   Patient Name:   CHUNG SUAREZ Tungate Date of Exam: 11/25/2020 Medical Rec #:  [id number]            Height:       64.0 in Accession #:    [id number]           Weight:       188.5 lb Date of Birth:  1955/12/28            BSA:          1.908 m Patient Age:    50 years             BP:           108/74 mmHg Patient Gender: M                    HR:           109 bpm. Exam Location:  [facility] Procedure: 2D Echo, Cardiac Doppler and Color Doppler Indications:    Bacteremia R78.81  History:        Patient has prior history of Echocardiogram examinations, most                 recent 01/13/2019. Risk Factors:Hypertension, Dyslipidemia,                 Former Smoker and Diabetes. Dysrhythmia (From Hx).  Sonographer:    Alvino Chapel RCS Referring Phys: [place]  1. Left ventricular ejection fraction, by estimation, is 60 to 65%. The left ventricle has normal function. The left ventricle has no  regional wall motion abnormalities. There is mild concentric left ventricular hypertrophy. Left ventricular diastolic parameters were normal.  2. Right ventricular systolic function is mildly reduced. The right ventricular size is moderately enlarged. Tricuspid regurgitation signal is inadequate for assessing PA pressure.  3. The mitral valve is grossly normal. Trivial mitral valve regurgitation. No evidence of mitral stenosis.  4. The aortic valve is tricuspid. Aortic valve regurgitation is not visualized. No aortic stenosis is present.  5. The inferior vena cava is normal in size with <50% respiratory variability, suggesting right atrial pressure of 8 mmHg. Comparison(s): Changes from prior study are noted. LV function remains unchanged. RV appears moderately dilated with mildly reduced function which is new from prior study. Conclusion(s)/Recommendation(s): No evidence of valvular vegetations on this transthoracic echocardiogram. Would recommend a transesophageal echocardiogram to exclude infective endocarditis if clinically indicated. FINDINGS  Left Ventricle: Left ventricular ejection fraction, by estimation, is 60 to 65%. The left ventricle has normal function. The left ventricle has no regional wall motion abnormalities. The left ventricular internal cavity size was normal in size. There is  mild concentric left ventricular hypertrophy. Left ventricular diastolic parameters were normal. Right Ventricle: The right ventricular size is moderately enlarged. No increase in right ventricular wall thickness. Right ventricular systolic function is mildly reduced. Tricuspid regurgitation signal is inadequate for assessing PA pressure. Left Atrium: Left atrial size was normal in size. Right Atrium: Right atrial size was normal in size. Pericardium: Trivial pericardial effusion is present. Presence of pericardial fat pad. Mitral Valve: The mitral valve is grossly normal. Trivial mitral valve regurgitation. No evidence  of mitral valve stenosis. Tricuspid Valve: The tricuspid valve is grossly normal. Tricuspid valve regurgitation is trivial. No evidence of tricuspid stenosis. Aortic Valve: The aortic valve is tricuspid. Aortic valve regurgitation is not visualized. No aortic stenosis is present. Pulmonic Valve: The pulmonic valve was grossly normal. Pulmonic valve regurgitation is trivial. No evidence of pulmonic stenosis. Aorta: The aortic root is normal in size and structure. Venous: The inferior vena cava is normal in size with less than 50% respiratory variability, suggesting right atrial pressure of 8 mmHg. IAS/Shunts: The atrial septum is grossly normal.  LEFT VENTRICLE PLAX 2D LVIDd:         4.45 cm  Diastology LVIDs:         2.80 cm  LV e' medial:    7.29 cm/s LV PW:         1.30 cm  LV E/e' medial:  12.1 LV IVS:        1.10 cm  LV e' lateral:   10.69 cm/s LVOT diam:     2.10 cm  LV E/e' lateral: 8.3 LV SV:         72 LV SV Index:   37 LVOT Area:     3.46 cm  RIGHT VENTRICLE RV S prime:     14.60 cm/s TAPSE (M-mode): 2.0 cm LEFT ATRIUM             Index       RIGHT ATRIUM           Index LA diam:        2.80 cm 1.47 cm/m  RA Area:     16.60 cm LA Vol (A2C):   54.8 ml 28.73 ml/m RA Volume:   42.10 ml  22.07 ml/m LA Vol (A4C):   53.7 ml 28.15 ml/m LA Biplane Vol: 57.3 ml 30.04 ml/m  AORTIC VALVE LVOT Vmax:   111.50 cm/s LVOT Vmean:  78.900 cm/s LVOT VTI:    0.207 m  AORTA Ao Root diam: 3.70 cm MITRAL VALVE MV Area (PHT): 4.07 cm     SHUNTS MV Decel Time: 187 msec     Systemic VTI:  0.21 m MV E velocity: 88.50 cm/s   Systemic Diam: 2.10 cm MV A velocity: 112.00 cm/s  MV E/A ratio:  0.79 Eleonore Chiquito MD Electronically signed by Eleonore Chiquito MD Signature Date/Time: 11/25/2020/3:42:15 PM    Final     Scheduled Meds:  aspirin  81 mg Oral Daily   atorvastatin  40 mg Oral Daily   Carbidopa-Levodopa ER  1 tablet Oral QID   Chlorhexidine Gluconate Cloth  6 each Topical Daily   heparin  5,000 Units Subcutaneous Q8H    insulin aspart  0-15 Units Subcutaneous TID WC   insulin aspart  0-5 Units Subcutaneous QHS   loratadine  10 mg Oral Daily   LORazepam  0.5 mg Oral TID   metoCLOPramide (REGLAN) injection  10 mg Intravenous Q12H   metoprolol tartrate  5 mg Intravenous Q6H   pantoprazole  40 mg Oral Daily   PARoxetine  40 mg Oral Daily   And   PARoxetine  20 mg Oral QHS   phenytoin  100 mg Oral TID   Continuous Infusions:  0.9 % NaCl with KCl 20 mEq / L 70 mL/hr at 11/25/20 2245    ceFAZolin (ANCEF) IV 2 g (11/26/20 0548)    LOS: 2 days   Critical Care Procedure Note Authorized and Performed by: Murvin Natal MD  Total Critical Care time:  40 mins  Due to a high probability of clinically significant, life threatening deterioration, the patient required my highest level of preparedness to intervene emergently and I personally spent this critical care time directly and personally managing the patient.  This critical care time included obtaining a history; examining the patient, pulse oximetry; ordering and review of studies; arranging urgent treatment with development of a management plan; evaluation of patient's response of treatment; frequent reassessment; and discussions with other providers.  This critical care time was performed to assess and manage the high probability of imminent and life threatening deterioration that could result in multi-organ failure.  It was exclusive of separately billable procedures and treating other patients and teaching time.    Irwin Brakeman, MD How to contact the [facility] Attending or Consulting provider [place] or covering provider during after hours [place], for this patient?  Check the care team in [facility] and look for a) attending/consulting [facility] provider listed and b) the [address] team listed Log into [email or web address] and use [place]'s universal password to access. If you do not have the password, please contact the hospital operator. Locate the [facility] provider you are looking for  under [facility] and page to a number that you can be directly reached. If you still have difficulty reaching the provider, please page the [facility] (Director on Call) for the Hospitalists listed on amion for assistance.  11/26/2020, 8:28 AM

## 2020-11-26 NOTE — Consult Note (Signed)
[facility] Consult  Reason for Consult: Abdominal pain, distention  Referring Physician: Dr. Wynetta Emery Dr. Clearence Ped  Chief Complaint   Constipation     HPI: Danny Hampton is a 65 y.o. adult with abdominal distention and pain. He has a history of GERD, HTN, hypontonia congenita, DM, who had back surgery 8/24 and has a post operative wound infection and bacteremia. He has had prior ileus in the past he reports but went home and started having Bms he says. He never had to have an NG in the past.  He had NG placed last night after Dr. Josph Macho was notified of worsening distention and pain. She discussed patient with with me and given the distention we discussed doing a rectal exam to see if any gas could evacuated. He had refused rectal tube.    I saw this patient this am and he remained distended. He is scheduled for transfer due to his wound infection.   Past Medical History:  Diagnosis Date  . Acid reflux   . Anxiety   . Arrhythmia    paroxysmal supreventricular tachycardia  . Arthritis   . Complication of anesthesia    Myotonia congenita  . Dysrhythmia   . Essential hypertension   . Family history of adverse reaction to anesthesia    Sister  . High cholesterol   . History of skin cancer   . Myotonia congenita   . Type 2 diabetes mellitus ([place])     Past Surgical History:  Procedure Laterality Date  . BACK SURGERY     times 2  . CARPAL TUNNEL RELEASE    . CATARACT EXTRACTION W/PHACO Left 01/31/2019   Procedure: CATARACT EXTRACTION PHACO AND INTRAOCULAR LENS PLACEMENT (IOC);  Surgeon: Baruch Goldmann, MD;  Location: [facility];  Service: Ophthalmology;  Laterality: Left;  CDE: 2.66  . NOSE SURGERY    . TOTAL KNEE ARTHROPLASTY Right 10/12/2018   Procedure: RIGHT TOTAL KNEE ARTHROPLASTY;  Surgeon: Garald Balding, MD;  Location: [facility];  Service: Orthopedics;  Laterality: Right;    Family History  Problem Relation Age of Onset  . Healthy Mother   .  Healthy Father   . Cancer Father     Social History   Tobacco Use  . Smoking status: Former    Packs/day: 1.00    Years: 30.00    Pack years: 30.00    Types: Cigarettes    Quit date: 03/2018    Years since quitting: 2.7  . Smokeless tobacco: Never  Vaping Use  . Vaping Use: Never used  Substance Use Topics  . Alcohol use: Not Currently    Comment: occasional drink  . Drug use: No    Medications: I have reviewed the patient's current medications. Prior to Admission:  Medications Prior to Admission  Medication Sig Dispense Refill Last Dose  . aspirin 81 MG chewable tablet Chew 1 tablet (81 mg total) by mouth daily.   11/22/2020  . atorvastatin (LIPITOR) 80 MG tablet Take 40 mg by mouth daily.    11/22/2020  . Carbidopa-Levodopa ER 48.75-195 MG CPCR Take 1 tablet by mouth 4 (four) times daily.   11/22/2020  . Cholecalciferol (VITAMIN D3) 125 MCG (5000 UT) TABS Take 5,000 Units by mouth daily.    11/22/2020  . cyclobenzaprine (FLEXERIL) 10 MG tablet Take 1 tablet (10 mg total) by mouth 3 (three) times daily as needed for muscle spasms. 30 tablet 0 unknown  . docusate sodium (COLACE) 100 MG capsule Take 100 mg by  mouth daily.   11/23/2020  . hydrOXYzine (ATARAX/VISTARIL) 25 MG tablet Take 25 mg by mouth in the morning, at noon, in the evening, and at bedtime.  0 11/22/2020  . lisinopril-hydrochlorothiazide (ZESTORETIC) 20-25 MG tablet Take 1 tablet by mouth daily.   11/22/2020  . loratadine (CLARITIN) 10 MG tablet Take 10 mg by mouth daily.   11/22/2020  . LORazepam (ATIVAN) 1 MG tablet Take 1.5 tablets by mouth in the morning, at noon, and at bedtime.   11/22/2020  . meloxicam (MOBIC) 15 MG tablet Take 15 mg by mouth daily.   11/22/2020  . metFORMIN (GLUCOPHAGE) 500 MG tablet Take 500 mg by mouth 2 (two) times daily with a meal.    11/22/2020  . metoprolol succinate (TOPROL XL) 25 MG 24 hr tablet Take 1 tablet (25 mg total) by mouth daily. 90 tablet 2 11/22/2020 at 0700  . Multiple Vitamin  (MULTI-VITAMIN) tablet Take 1 tablet by mouth daily.   11/22/2020  . pantoprazole (PROTONIX) 40 MG tablet Take 40 mg by mouth daily.   11/22/2020  . PARoxetine (PAXIL) 40 MG tablet Take 20-40 mg by mouth See admin instructions. Take 40 mg by mouth in the morning and 20 mg in the evening   11/22/2020  . phenytoin (DILANTIN) 100 MG ER capsule Take 100 mg by mouth 3 (three) times daily.   11/22/2020  . rOPINIRole (REQUIP) 1 MG tablet Take 0.'5mg'$  at 6pm and '1mg'$  at 9pm. (Patient taking differently: Take 0.5 mg by mouth See admin instructions. Take 0.5 mg by mouth every evening and 6 pm and again at 9 pm) 135 tablet 3 11/22/2020  . sodium chloride (MURO 128) 5 % ophthalmic solution Place 1 drop into the right eye every 3 (three) hours as needed for eye irritation.   unknown  . traMADol (ULTRAM) 50 MG tablet Take by mouth.     . zinc sulfate 220 (50 Zn) MG capsule Take 1 capsule (220 mg total) by mouth daily. 30 capsule 3 11/22/2020  . albuterol (VENTOLIN HFA) 108 (90 Base) MCG/ACT inhaler Inhale 2 puffs into the lungs every 6 (six) hours as needed for wheezing or shortness of breath. (Patient not taking: Reported on 11/23/2020) 8 g 1 Not Taking  . ascorbic acid (VITAMIN C) 500 MG tablet Take 1 tablet (500 mg total) by mouth daily. (Patient not taking: Reported on 10/16/2020) 30 tablet 1   . guaiFENesin-dextromethorphan (ROBITUSSIN DM) 100-10 MG/5ML syrup Take 10 mLs by mouth every 6 (six) hours as needed for cough. (Patient not taking: Reported on 11/23/2020) 236 mL 0 Not Taking  . methocarbamol (ROBAXIN) 500 MG tablet Take by mouth. (Patient not taking: No sig reported)   Not Taking  . oxyCODONE (OXY IR/ROXICODONE) 5 MG immediate release tablet Take 1 tablet (5 mg total) by mouth every 3 (three) hours as needed (pain). (Patient not taking: No sig reported) 30 tablet 0 Not Taking   Scheduled: . aspirin  81 mg Oral Daily  . atorvastatin  40 mg Oral Daily  . Carbidopa-Levodopa ER  1 tablet Oral QID  . Chlorhexidine  Gluconate Cloth  6 each Topical Daily  . heparin  5,000 Units Subcutaneous Q8H  . insulin aspart  0-15 Units Subcutaneous TID WC  . insulin aspart  0-5 Units Subcutaneous QHS  . loratadine  10 mg Oral Daily  . metoCLOPramide (REGLAN) injection  10 mg Intravenous Q12H  . metoprolol tartrate  5 mg Intravenous Q6H  . pantoprazole (PROTONIX) IV  40 mg Intravenous  Q24H  . PARoxetine  40 mg Oral Daily   And  . PARoxetine  20 mg Oral QHS  . phenytoin (DILANTIN) IV  100 mg Intravenous Q8H   Continuous: . 0.9 % NaCl with KCl 20 mEq / L 70 mL/hr at 11/26/20 1557  .  ceFAZolin (ANCEF) IV Stopped (11/26/20 1349)   [id number] **OR** acetaminophen, albuterol, HYDROmorphone (DILAUDID) injection, hydrOXYzine, LORazepam, ondansetron **OR** ondansetron (ZOFRAN) IV, oxyCODONE  No Known Allergies   ROS:  A comprehensive review of systems was negative except for: Gastrointestinal: positive for abdominal pain, nausea, and distention, constipation   Blood pressure (!) 139/100, pulse 100, temperature 97.6 F (36.4 C), temperature source Axillary, resp. rate (!) 6, height '5\' 4"'$  (1.626 m), weight 95.7 kg, SpO2 94 %. Physical Exam Vitals reviewed.  Constitutional:      Appearance: Normal appearance.  HENT:     Head: Normocephalic.     Nose: Nose normal.  Eyes:     Extraocular Movements: Extraocular movements intact.  Cardiovascular:     Rate and Rhythm: Normal rate.  Pulmonary:     Effort: Pulmonary effort is normal.  Abdominal:     General: There is distension.     Palpations: Abdomen is soft.     Tenderness: There is abdominal tenderness.  Musculoskeletal:        General: No swelling.  Skin:    General: Skin is warm.  Neurological:     General: No focal deficit present.     Mental Status: He is alert.  Psychiatric:        Mood and Affect: Mood normal.        Behavior: Behavior normal.    Results: Results for orders placed or performed during the hospital encounter of 11/23/20  (from the past 48 hour(s))  CBG monitoring, ED     Status: Abnormal   Collection Time: 11/24/20  7:59 AM  Result Value Ref Range   Glucose-Capillary 172 (H) 70 - 99 mg/dL    Comment: Glucose reference range applies only to samples taken after fasting for at least 8 hours.  Culture, blood (routine x 2)     Status: None (Preliminary result)   Collection Time: 11/24/20  8:15 AM   Specimen: BLOOD LEFT HAND  Result Value Ref Range   Specimen Description      BLOOD LEFT HAND BOTTLES DRAWN AEROBIC AND ANAEROBIC Performed at [facility], [address] [place], [place] [address]    Special Requests      Blood Culture adequate volume Performed at [facility], [address], [place], [place] [address]    Culture  Setup Time      GRAM POSITIVE COCCI IN BOTH AEROBIC AND ANAEROBIC BOTTLES CRITICAL RESULT CALLED TO, READ BACK BY AND VERIFIED WITH: PHARM D L.POOLE ON [id number] AT 0818 BY E.PARRISH Performed at [facility], [place] [address], [place], [place] [address]    Culture GRAM POSITIVE COCCI    Report Status PENDING   Blood Culture ID Panel (Reflexed)     Status: Abnormal   Collection Time: 11/24/20  8:15 AM  Result Value Ref Range   Enterococcus faecalis NOT DETECTED NOT DETECTED   Enterococcus Faecium NOT DETECTED NOT DETECTED   Listeria monocytogenes NOT DETECTED NOT DETECTED   Staphylococcus species DETECTED (A) NOT DETECTED    Comment: CRITICAL RESULT CALLED TO, READ BACK BY AND VERIFIED WITH: PHARM D L.POOLE ON [id number] AT 0818 BY E.PARRISH    Staphylococcus aureus (BCID) DETECTED (A) NOT  DETECTED    Comment: CRITICAL RESULT CALLED TO, READ BACK BY AND VERIFIED WITH: PHARM D L.POOLE ON [id number] AT 0818 BY E.PARRISH    Staphylococcus epidermidis NOT DETECTED NOT DETECTED   Staphylococcus lugdunensis NOT DETECTED NOT DETECTED   Streptococcus species NOT DETECTED NOT DETECTED   Streptococcus agalactiae NOT DETECTED NOT DETECTED   Streptococcus pneumoniae NOT DETECTED NOT  DETECTED   Streptococcus pyogenes NOT DETECTED NOT DETECTED   A.calcoaceticus-baumannii NOT DETECTED NOT DETECTED   Bacteroides fragilis NOT DETECTED NOT DETECTED   Enterobacterales NOT DETECTED NOT DETECTED   Enterobacter cloacae complex NOT DETECTED NOT DETECTED   Escherichia coli NOT DETECTED NOT DETECTED   Klebsiella aerogenes NOT DETECTED NOT DETECTED   Klebsiella oxytoca NOT DETECTED NOT DETECTED   Klebsiella pneumoniae NOT DETECTED NOT DETECTED   Proteus species NOT DETECTED NOT DETECTED   Salmonella species NOT DETECTED NOT DETECTED   Serratia marcescens NOT DETECTED NOT DETECTED   Haemophilus influenzae NOT DETECTED NOT DETECTED   Neisseria meningitidis NOT DETECTED NOT DETECTED   Pseudomonas aeruginosa NOT DETECTED NOT DETECTED   Stenotrophomonas maltophilia NOT DETECTED NOT DETECTED   Candida albicans NOT DETECTED NOT DETECTED   Candida auris NOT DETECTED NOT DETECTED   Candida glabrata NOT DETECTED NOT DETECTED   Candida krusei NOT DETECTED NOT DETECTED   Candida parapsilosis NOT DETECTED NOT DETECTED   Candida tropicalis NOT DETECTED NOT DETECTED   Cryptococcus neoformans/gattii NOT DETECTED NOT DETECTED   Meth resistant mecA/C and MREJ NOT DETECTED NOT DETECTED    Comment: Performed at [facility] [address] [place], [place] [address]  Lactic acid, plasma     Status: Abnormal   Collection Time: 11/24/20  8:16 AM  Result Value Ref Range   Lactic Acid, Venous 3.2 (HH) 0.5 - 1.9 mmol/L    Comment: CRITICAL RESULT CALLED TO, READ BACK BY AND VERIFIED WITH: BLACKBURN C. @ 0852 ON [id number] BY HENDERSON L. Performed at [facility], [address] [place], [place] [address]   Culture, blood (routine x 2)     Status: None (Preliminary result)   Collection Time: 11/24/20  8:16 AM   Specimen: BLOOD RIGHT HAND  Result Value Ref Range   Specimen Description      BLOOD RIGHT HAND BOTTLES DRAWN AEROBIC AND ANAEROBIC Performed at [facility], [address],  [place], [place] [address]    Special Requests      Blood Culture adequate volume Performed at [facility], [address], [place], [place] [address]    Culture  Setup Time      GRAM POSITIVE COCCI IN BOTH AEROBIC AND ANAEROBIC BOTTLES CRITICAL VALUE NOTED.  VALUE IS CONSISTENT WITH PREVIOUSLY REPORTED AND CALLED VALUE. Performed at [facility], [place] [address] [place], [place] [address]    Culture      NO GROWTH < 24 HOURS Performed at [facility], [address] [place], [place] [address]    Report Status PENDING   Lactic acid, plasma     Status: Abnormal   Collection Time: 11/24/20 11:57 AM  Result Value Ref Range   Lactic Acid, Venous 2.6 (HH) 0.5 - 1.9 mmol/L    Comment: CRITICAL RESULT CALLED TO, READ BACK BY AND VERIFIED WITH: DEVOLT,T 1306 11/24/2020 COLEMAN,R Performed at [facility], [address] [place], [place] [address]   CBG monitoring, ED     Status: Abnormal   Collection Time: 11/24/20 12:48 PM  Result Value Ref Range   Glucose-Capillary  175 (H) 70 - 99 mg/dL    Comment: Glucose reference range applies only to samples taken after fasting for at least 8 hours.  MRSA Next Gen by PCR, Nasal     Status: None   Collection Time: 11/24/20  3:30 PM   Specimen: Nasal Mucosa; Nasal Swab  Result Value Ref Range   MRSA by PCR Next Gen NOT DETECTED NOT DETECTED    Comment: (NOTE) The GeneXpert MRSA Assay (FDA approved for NASAL specimens only), is one component of a comprehensive MRSA colonization surveillance program. It is not intended to diagnose MRSA infection nor to guide or monitor treatment for MRSA infections. Test performance is not FDA approved in patients less than 66 years old. Performed at [facility], [address], [place], [place] [address]   Lactic acid, plasma     Status: None   Collection Time: 11/24/20  3:35 PM  Result Value Ref Range   Lactic Acid, Venous 1.7 0.5 - 1.9 mmol/L    Comment: Performed at [facility], [address],  [place], [place] [address]  Glucose, capillary     Status: Abnormal   Collection Time: 11/24/20  5:13 PM  Result Value Ref Range   Glucose-Capillary 159 (H) 70 - 99 mg/dL    Comment: Glucose reference range applies only to samples taken after fasting for at least 8 hours.  Glucose, capillary     Status: Abnormal   Collection Time: 11/24/20  8:25 PM  Result Value Ref Range   Glucose-Capillary 171 (H) 70 - 99 mg/dL    Comment: Glucose reference range applies only to samples taken after fasting for at least 8 hours.   Comment 1 Notify RN    Comment 2 Document in Chart   CBC with Differential/Platelet     Status: Abnormal   Collection Time: 11/25/20  3:06 AM  Result Value Ref Range   WBC 6.8 4.0 - 10.5 K/uL   RBC 3.03 (L) 4.22 - 5.81 MIL/uL   Hemoglobin 9.6 (L) 13.0 - 17.0 g/dL   HCT 28.1 (L) 39.0 - 52.0 %   MCV 92.7 80.0 - 100.0 fL   MCH 31.7 26.0 - 34.0 pg   MCHC 34.2 30.0 - 36.0 g/dL   RDW 12.3 11.5 - 15.5 %   Platelets 104 (L) 150 - 400 K/uL    Comment: SPECIMEN CHECKED FOR CLOTS   nRBC 0.0 0.0 - 0.2 %   Neutrophils Relative % 91 %   Neutro Abs 6.1 1.7 - 7.7 K/uL   Lymphocytes Relative 3 %   Lymphs Abs 0.2 (L) 0.7 - 4.0 K/uL   Monocytes Relative 5 %   Monocytes Absolute 0.4 0.1 - 1.0 K/uL   Eosinophils Relative 0 %   Eosinophils Absolute 0.0 0.0 - 0.5 K/uL   Basophils Relative 0 %   Basophils Absolute 0.0 0.0 - 0.1 K/uL   Immature Granulocytes 1 %   Abs Immature Granulocytes 0.06 0.00 - 0.07 K/uL    Comment: Performed at [facility], [address], [place], [place] [address]  Comprehensive metabolic panel     Status: Abnormal   Collection Time: 11/25/20  3:06 AM  Result Value Ref Range   Sodium 124 (L) 135 - 145 mmol/L   Potassium 3.2 (L) 3.5 - 5.1 mmol/L   Chloride 90 (L) 98 - 111 mmol/L   CO2 25 22 - 32 mmol/L   Glucose, Bld 154 (H) 70 - 99 mg/dL    Comment: Glucose reference range applies only  to samples taken after fasting for at least 8 hours.   BUN 14 8 - 23 mg/dL    Creatinine, Ser 0.79 0.61 - 1.24 mg/dL   Calcium 7.4 (L) 8.9 - 10.3 mg/dL   Total Protein 5.6 (L) 6.5 - 8.1 g/dL   Albumin 2.7 (L) 3.5 - 5.0 g/dL   AST 41 15 - 41 U/L   ALT 9 0 - 44 U/L   Alkaline Phosphatase 60 38 - 126 U/L   Total Bilirubin 0.6 0.3 - 1.2 mg/dL   GFR, Estimated >60 >60 mL/min    Comment: (NOTE) Calculated using the CKD-EPI Creatinine Equation (2021)    Anion gap 9 5 - 15    Comment: Performed at [facility], [address] [place], [place] [address]  Magnesium     Status: None   Collection Time: 11/25/20  3:06 AM  Result Value Ref Range   Magnesium 1.9 1.7 - 2.4 mg/dL    Comment: Performed at [facility], [address], [place], [place] [address]  Procalcitonin     Status: None   Collection Time: 11/25/20  3:06 AM  Result Value Ref Range   Procalcitonin 2.44 ng/mL    Comment:        Interpretation: PCT > 2 ng/mL: Systemic infection (sepsis) is likely, unless other causes are known. (NOTE)       Sepsis PCT Algorithm           Lower Respiratory Tract                                      Infection PCT Algorithm    ----------------------------     ----------------------------         PCT < 0.25 ng/mL                PCT < 0.10 ng/mL          Strongly encourage             Strongly discourage   discontinuation of antibiotics    initiation of antibiotics    ----------------------------     -----------------------------       PCT 0.25 - 0.50 ng/mL            PCT 0.10 - 0.25 ng/mL               OR       >80% decrease in PCT            Discourage initiation of                                            antibiotics      Encourage discontinuation           of antibiotics    ----------------------------     -----------------------------         PCT >= 0.50 ng/mL              PCT 0.26 - 0.50 ng/mL               AND       <80% decrease in PCT              Encourage initiation of  antibiotics       Encourage continuation            of antibiotics    ----------------------------     -----------------------------        PCT >= 0.50 ng/mL                  PCT > 0.50 ng/mL               AND         increase in PCT                  Strongly encourage                                      initiation of antibiotics    Strongly encourage escalation           of antibiotics                                     -----------------------------                                           PCT <= 0.25 ng/mL                                                 OR                                        > 80% decrease in PCT                                      Discontinue / Do not initiate                                             antibiotics  Performed at [facility], [address] [place], [place] [address]   Osmolality     Status: Abnormal   Collection Time: 11/25/20  3:06 AM  Result Value Ref Range   Osmolality 259 (L) 275 - 295 mOsm/kg    Comment: Performed at [facility] [address], [place], [place] [address]  Glucose, capillary     Status: Abnormal   Collection Time: 11/25/20  7:43 AM  Result Value Ref Range   Glucose-Capillary 173 (H) 70 - 99 mg/dL    Comment: Glucose reference range applies only to samples taken after fasting for at least 8 hours.  Glucose, capillary     Status: Abnormal   Collection Time: 11/25/20 10:59 AM  Result Value Ref Range   Glucose-Capillary 157 (H) 70 - 99 mg/dL    Comment: Glucose reference range applies only to samples taken after fasting for at least 8 hours.  Aerobic Culture w Gram Stain (superficial specimen)  Status: None (Preliminary result)   Collection Time: 11/25/20 11:00 AM   Specimen: Back; Wound  Result Value Ref Range   Specimen Description      BACK Performed at [facility], [address], [place], [place] [address]    Special Requests      Normal Performed at [facility], [address], [place], [place] [address]    Gram Stain      NO WBC  SEEN FEW GRAM POSITIVE COCCI Performed at [facility], [place] [address] [place], [place] [address]    Culture PENDING    Report Status PENDING   Culture, blood (routine x 2)     Status: None (Preliminary result)   Collection Time: 11/25/20  3:42 PM   Specimen: BLOOD LEFT FOREARM  Result Value Ref Range   Specimen Description      BLOOD LEFT FOREARM BOTTLES DRAWN AEROBIC AND ANAEROBIC   Special Requests      Blood Culture adequate volume Performed at [facility], [address] [place], [place] [address]    Culture PENDING    Report Status PENDING   C-reactive protein     Status: Abnormal   Collection Time: 11/25/20  3:51 PM  Result Value Ref Range   CRP 73.8 (H) <1.0 mg/dL    Comment: RESULTS CONFIRMED BY MANUAL DILUTION Performed at [facility], [address], [place], [place] [address]   Sedimentation rate     Status: Abnormal   Collection Time: 11/25/20  3:52 PM  Result Value Ref Range   Sed Rate 88 (H) 0 - 16 mm/hr    Comment: Performed at [facility], [address] [place], [place] [address]  Culture, blood (routine x 2)     Status: None (Preliminary result)   Collection Time: 11/25/20  3:55 PM   Specimen: Right Antecubital; Blood  Result Value Ref Range   Specimen Description      RIGHT ANTECUBITAL BOTTLES DRAWN AEROBIC AND ANAEROBIC   Special Requests      Blood Culture adequate volume Performed at [facility], [address], [place], [place] [address]    Culture PENDING    Report Status PENDING   Glucose, capillary     Status: Abnormal   Collection Time: 11/25/20  4:43 PM  Result Value Ref Range   Glucose-Capillary 136 (H) 70 - 99 mg/dL    Comment: Glucose reference range applies only to samples taken after fasting for at least 8 hours.  Glucose, capillary     Status: Abnormal   Collection Time: 11/25/20  9:31 PM  Result Value Ref Range   Glucose-Capillary 154 (H) 70 - 99 mg/dL    Comment: Glucose reference range applies only to samples taken  after fasting for at least 8 hours.   Comment 1 Notify RN    Comment 2 Document in Chart   C-reactive protein     Status: Abnormal   Collection Time: 11/26/20  4:54 AM  Result Value Ref Range   CRP 57.5 (H) <1.0 mg/dL    Comment: RESULTS CONFIRMED BY MANUAL DILUTION Performed at [facility], [address], [place], [place] [address]   Sedimentation rate     Status: Abnormal   Collection Time: 11/26/20  4:54 AM  Result Value Ref Range   Sed Rate 73 (H) 0 - 16 mm/hr    Comment: Performed at [facility], [address] [place], [place] [address]  Comprehensive metabolic panel     Status: Abnormal  Collection Time: 11/26/20  4:54 AM  Result Value Ref Range   Sodium 124 (L) 135 - 145 mmol/L   Potassium 4.5 3.5 - 5.1 mmol/L    Comment: DELTA CHECK NOTED   Chloride 91 (L) 98 - 111 mmol/L   CO2 22 22 - 32 mmol/L   Glucose, Bld 138 (H) 70 - 99 mg/dL    Comment: Glucose reference range applies only to samples taken after fasting for at least 8 hours.   BUN 25 (H) 8 - 23 mg/dL   Creatinine, Ser 1.36 (H) 0.61 - 1.24 mg/dL   Calcium 7.6 (L) 8.9 - 10.3 mg/dL   Total Protein 6.4 (L) 6.5 - 8.1 g/dL   Albumin 2.8 (L) 3.5 - 5.0 g/dL   AST 330 (H) 15 - 41 U/L   ALT 13 0 - 44 U/L   Alkaline Phosphatase 55 38 - 126 U/L   Total Bilirubin 0.8 0.3 - 1.2 mg/dL   GFR, Estimated 58 (L) >60 mL/min    Comment: (NOTE) Calculated using the CKD-EPI Creatinine Equation (2021)    Anion gap 11 5 - 15    Comment: Performed at [facility], [address] [place], [place] [address]  Procalcitonin     Status: None   Collection Time: 11/26/20  4:54 AM  Result Value Ref Range   Procalcitonin 3.95 ng/mL    Comment:        Interpretation: PCT > 2 ng/mL: Systemic infection (sepsis) is likely, unless other causes are known. (NOTE)       Sepsis PCT Algorithm           Lower Respiratory Tract                                      Infection PCT Algorithm    ----------------------------      ----------------------------         PCT < 0.25 ng/mL                PCT < 0.10 ng/mL          Strongly encourage             Strongly discourage   discontinuation of antibiotics    initiation of antibiotics    ----------------------------     -----------------------------       PCT 0.25 - 0.50 ng/mL            PCT 0.10 - 0.25 ng/mL               OR       >80% decrease in PCT            Discourage initiation of                                            antibiotics      Encourage discontinuation           of antibiotics    ----------------------------     -----------------------------         PCT >= 0.50 ng/mL              PCT 0.26 - 0.50 ng/mL               AND       <80% decrease in  PCT              Encourage initiation of                                             antibiotics       Encourage continuation           of antibiotics    ----------------------------     -----------------------------        PCT >= 0.50 ng/mL                  PCT > 0.50 ng/mL               AND         increase in PCT                  Strongly encourage                                      initiation of antibiotics    Strongly encourage escalation           of antibiotics                                     -----------------------------                                           PCT <= 0.25 ng/mL                                                 OR                                        > 80% decrease in PCT                                      Discontinue / Do not initiate                                             antibiotics  Performed at [facility], [address], [place], [place] [address]   CBC with Differential/Platelet     Status: Abnormal   Collection Time: 11/26/20  4:54 AM  Result Value Ref Range   WBC 6.4 4.0 - 10.5 K/uL   RBC 3.09 (L) 4.22 - 5.81 MIL/uL   Hemoglobin 9.8 (L) 13.0 - 17.0 g/dL   HCT 29.5 (L) 39.0 - 52.0 %   MCV 95.5 80.0 - 100.0 fL   MCH 31.7 26.0 - 34.0 pg   MCHC  33.2 30.0 - 36.0 g/dL   RDW 12.8 11.5 - 15.5 %   Platelets 83 (L) 150 -  400 K/uL    Comment: SPECIMEN CHECKED FOR CLOTS Immature Platelet Fraction may be clinically indicated, consider ordering this additional test [id number]    nRBC 0.0 0.0 - 0.2 %   Neutrophils Relative % 73 %   Neutro Abs 5.3 1.7 - 7.7 K/uL   Band Neutrophils 10 %   Lymphocytes Relative 10 %   Lymphs Abs 0.6 (L) 0.7 - 4.0 K/uL   Monocytes Relative 4 %   Monocytes Absolute 0.3 0.1 - 1.0 K/uL   Eosinophils Relative 0 %   Eosinophils Absolute 0.0 0.0 - 0.5 K/uL   Basophils Relative 0 %   Basophils Absolute 0.0 0.0 - 0.1 K/uL   Metamyelocytes Relative 2 %   Myelocytes 1 %    Comment: Performed at [facility], [address], [place], [place] [address]  Glucose, capillary     Status: Abnormal   Collection Time: 11/26/20  7:17 AM  Result Value Ref Range   Glucose-Capillary 152 (H) 70 - 99 mg/dL    Comment: Glucose reference range applies only to samples taken after fasting for at least 8 hours.    DG Abd 1 View  Result Date: 11/25/2020 CLINICAL DATA:  NG tube placement. EXAM: ABDOMEN - 1 VIEW COMPARISON:  Abdominal x-ray 11/25/2020. FINDINGS: Nasogastric tube tip is in the proximal stomach, just beyond the gastroesophageal junction. Again seen is diffuse gaseous distension of the colon. This is slightly decreased when compared to the prior study. Air seen within central nondilated small bowel. The visualized lung bases are clear. Thoracolumbar fusion hardware is again seen. IMPRESSION: 1. Nasogastric tube tip is in the proximal stomach just beyond the gastroesophageal junction. Recommend advancing tube. 2. Gaseous distension of the entire colon is again seen. Degree of distention has slightly improved. Continued follow-up recommended. Electronically Signed   By: Ronney Asters M.D.   On: 11/25/2020 21:09   DG CHEST PORT 1 VIEW  Result Date: 11/25/2020 CLINICAL DATA:  Tachypnea. EXAM: PORTABLE CHEST 1 VIEW COMPARISON:   April 20, 2020 FINDINGS: No pneumothorax. The low volume portable technique limits evaluation. No focal infiltrates or overt edema. The cardiomediastinal silhouette is stable. No other acute abnormalities. IMPRESSION: No acute abnormalities are identified. The study is limited due to the low volume portable technique. Electronically Signed   By: Dorise Bullion III M.D.   On: 11/25/2020 08:15   DG Abd Portable 1V  Result Date: 11/25/2020 CLINICAL DATA:  Continued abdominal pain. EXAM: PORTABLE ABDOMEN - 1 VIEW COMPARISON:  CT scan of the abdomen November 23, 2020 FINDINGS: Pedicle rods and screws are seen from the lower thoracic spine through the sacrum. Lung bases are poorly visualized but unremarkable. No free air, portal venous gas, or pneumatosis. The colon is air-filled and prominent. Colonic gas extends to the level the rectum. The dilated small bowel loops on the recent CT scan are not as well visualized on this study. IMPRESSION: 1. Prominent air-filled colon with colonic gas extending to the level of the rectum. The findings suggest ileus. Previously identified dilated loops of small bowel are not as well visualized on this study. No other abnormalities. Electronically Signed   By: Dorise Bullion III M.D.   On: 11/25/2020 08:18   ECHOCARDIOGRAM COMPLETE  Result Date: 11/25/2020    ECHOCARDIOGRAM REPORT   Patient Name:   Danny Hampton Date of Exam: 11/25/2020 Medical Rec #:  [id number]            Height:       64.0  in Accession #:    [id number]           Weight:       188.5 lb Date of Birth:  07-Jun-1955            BSA:          1.908 m Patient Age:    17 years             BP:           108/74 mmHg Patient Gender: M                    HR:           109 bpm. Exam Location:  [facility] Procedure: 2D Echo, Cardiac Doppler and Color Doppler Indications:    Bacteremia R78.81  History:        Patient has prior history of Echocardiogram examinations, most                 recent 01/13/2019. Risk  Factors:Hypertension, Dyslipidemia,                 Former Smoker and Diabetes. Dysrhythmia (From Hx).  Sonographer:    Alvino Chapel RCS Referring Phys: [place]  1. Left ventricular ejection fraction, by estimation, is 60 to 65%. The left ventricle has normal function. The left ventricle has no regional wall motion abnormalities. There is mild concentric left ventricular hypertrophy. Left ventricular diastolic parameters were normal.  2. Right ventricular systolic function is mildly reduced. The right ventricular size is moderately enlarged. Tricuspid regurgitation signal is inadequate for assessing PA pressure.  3. The mitral valve is grossly normal. Trivial mitral valve regurgitation. No evidence of mitral stenosis.  4. The aortic valve is tricuspid. Aortic valve regurgitation is not visualized. No aortic stenosis is present.  5. The inferior vena cava is normal in size with <50% respiratory variability, suggesting right atrial pressure of 8 mmHg. Comparison(s): Changes from prior study are noted. LV function remains unchanged. RV appears moderately dilated with mildly reduced function which is new from prior study. Conclusion(s)/Recommendation(s): No evidence of valvular vegetations on this transthoracic echocardiogram. Would recommend a transesophageal echocardiogram to exclude infective endocarditis if clinically indicated. FINDINGS  Left Ventricle: Left ventricular ejection fraction, by estimation, is 60 to 65%. The left ventricle has normal function. The left ventricle has no regional wall motion abnormalities. The left ventricular internal cavity size was normal in size. There is  mild concentric left ventricular hypertrophy. Left ventricular diastolic parameters were normal. Right Ventricle: The right ventricular size is moderately enlarged. No increase in right ventricular wall thickness. Right ventricular systolic function is mildly reduced. Tricuspid regurgitation signal is  inadequate for assessing PA pressure. Left Atrium: Left atrial size was normal in size. Right Atrium: Right atrial size was normal in size. Pericardium: Trivial pericardial effusion is present. Presence of pericardial fat pad. Mitral Valve: The mitral valve is grossly normal. Trivial mitral valve regurgitation. No evidence of mitral valve stenosis. Tricuspid Valve: The tricuspid valve is grossly normal. Tricuspid valve regurgitation is trivial. No evidence of tricuspid stenosis. Aortic Valve: The aortic valve is tricuspid. Aortic valve regurgitation is not visualized. No aortic stenosis is present. Pulmonic Valve: The pulmonic valve was grossly normal. Pulmonic valve regurgitation is trivial. No evidence of pulmonic stenosis. Aorta: The aortic root is normal in size and structure. Venous: The inferior vena cava is normal in size with less than 50% respiratory variability, suggesting right atrial  pressure of 8 mmHg. IAS/Shunts: The atrial septum is grossly normal.  LEFT VENTRICLE PLAX 2D LVIDd:         4.45 cm  Diastology LVIDs:         2.80 cm  LV e' medial:    7.29 cm/s LV PW:         1.30 cm  LV E/e' medial:  12.1 LV IVS:        1.10 cm  LV e' lateral:   10.69 cm/s LVOT diam:     2.10 cm  LV E/e' lateral: 8.3 LV SV:         72 LV SV Index:   37 LVOT Area:     3.46 cm  RIGHT VENTRICLE RV S prime:     14.60 cm/s TAPSE (M-mode): 2.0 cm LEFT ATRIUM             Index       RIGHT ATRIUM           Index LA diam:        2.80 cm 1.47 cm/m  RA Area:     16.60 cm LA Vol (A2C):   54.8 ml 28.73 ml/m RA Volume:   42.10 ml  22.07 ml/m LA Vol (A4C):   53.7 ml 28.15 ml/m LA Biplane Vol: 57.3 ml 30.04 ml/m  AORTIC VALVE LVOT Vmax:   111.50 cm/s LVOT Vmean:  78.900 cm/s LVOT VTI:    0.207 m  AORTA Ao Root diam: 3.70 cm MITRAL VALVE MV Area (PHT): 4.07 cm     SHUNTS MV Decel Time: 187 msec     Systemic VTI:  0.21 m MV E velocity: 88.50 cm/s   Systemic Diam: 2.10 cm MV A velocity: 112.00 cm/s MV E/A ratio:  0.79 Eleonore Chiquito  MD Electronically signed by Eleonore Chiquito MD Signature Date/Time: 11/25/2020/3:42:15 PM    Final      Assessment & Plan:  Decoda Delmastro is a 65 y.o. adult with ileus but has a significantly distended colon. If this is mostly in the colon it could be more of an Ogilvie's type picture and may benefit from decompression.  I have inserted a red rubber at bedside with no obvious change in his exam. KUB ordered.  -Will review the imagine with radiology and discuss if this is more ileus who bowel dilation or more confined to the colon  -Minimize any po meds   All questions were answered to the satisfaction of the patient and family. Discussed with RN and Dr. Wynetta Emery.      Virl Cagey 11/26/2020, 7:57 AM

## 2020-11-26 NOTE — Progress Notes (Addendum)
Patient alert and oriented x4. NG tube in place. Nurse to nurse report called and given to Samer RN on [place]. Patient transferred with all belongings including home medication picked up from pharmacy. Nurse and patient aware. Prn pain meds given prior to transport. Patient contact person, Juliann Pulse made aware of transfer.

## 2020-11-26 NOTE — Progress Notes (Signed)
Pt's HR became tachy to 140's , MD notified and Okayed giving Metoprolol earlier than scheduled for this

## 2020-11-26 NOTE — Progress Notes (Signed)
Rectal exam done. Normal rectal tone. Small amount of flatulence present, but no real relief. Patient reports NG tube has given him some relief.

## 2020-11-26 NOTE — Progress Notes (Addendum)
[facility]  Reviewed Xray with radiology and agree ileus with small bowel and colon dilation. Rectal tube red rubber has been out since MRI.  Remains distended. Would not replace at this time little effect prior. Ng to suction, replace electrolytes, limit po , and treat infection. No surgical intervention indicated at this time.    Curlene Labrum, MD [facility] [address] [place], [place] [address] [phone] (office)

## 2020-11-26 NOTE — Progress Notes (Addendum)
1959:  Notified Dr. Deliah Boston regarding pt increase need for oxygen.  Pt has went from room air up to 12L high-flow.  Also notified that pt abdomen is distended and has no bowel sounds.  2024:  Received order for NGT due to increased need for O2 and pt other symptoms.  2045:  NGT inserted per order.  Pt tolerated well. 2136:  Notified Dr. Berneice Gandy pt refusing rectal tube and that he is having an increasing temperature while being diaphoretic.   2145:  Dr. ordered to give tylenol.  0000:  Pt c/o being hot and sweaty.  Temperature is 99.2.  Cool cloth applied to pt head and ice bags applied to assist with cooling pt down.  73:  Dr. Deliah Boston in to do rectal exam.  0600:  Straight cathed pt at this time per dr order due to pt unable to void.

## 2020-11-27 ENCOUNTER — Inpatient Hospital Stay (HOSPITAL_COMMUNITY): Payer: No Typology Code available for payment source | Admitting: Certified Registered Nurse Anesthetist

## 2020-11-27 ENCOUNTER — Inpatient Hospital Stay (HOSPITAL_COMMUNITY): Payer: No Typology Code available for payment source

## 2020-11-27 ENCOUNTER — Encounter (HOSPITAL_COMMUNITY)
Admission: EM | Disposition: A | Discharge status: Skilled Nursing Facility | Payer: Self-pay | Source: Home / Self Care | Attending: Family Medicine

## 2020-11-27 DIAGNOSIS — T8149XA Infection following a procedure, other surgical site, initial encounter: Secondary | ICD-10-CM | POA: Diagnosis not present

## 2020-11-27 DIAGNOSIS — A4101 Sepsis due to Methicillin susceptible Staphylococcus aureus: Secondary | ICD-10-CM | POA: Diagnosis not present

## 2020-11-27 DIAGNOSIS — Z20822 Contact with and (suspected) exposure to covid-19: Secondary | ICD-10-CM | POA: Diagnosis not present

## 2020-11-27 DIAGNOSIS — R7881 Bacteremia: Secondary | ICD-10-CM

## 2020-11-27 DIAGNOSIS — T8141XA Infection following a procedure, superficial incisional surgical site, initial encounter: Secondary | ICD-10-CM | POA: Diagnosis not present

## 2020-11-27 DIAGNOSIS — B9561 Methicillin susceptible Staphylococcus aureus infection as the cause of diseases classified elsewhere: Secondary | ICD-10-CM

## 2020-11-27 DIAGNOSIS — Z8616 Personal history of COVID-19: Secondary | ICD-10-CM | POA: Diagnosis not present

## 2020-11-27 DIAGNOSIS — R748 Abnormal levels of other serum enzymes: Secondary | ICD-10-CM

## 2020-11-27 DIAGNOSIS — J9601 Acute respiratory failure with hypoxia: Secondary | ICD-10-CM | POA: Diagnosis not present

## 2020-11-27 DIAGNOSIS — I4892 Unspecified atrial flutter: Secondary | ICD-10-CM | POA: Diagnosis not present

## 2020-11-27 DIAGNOSIS — R140 Abdominal distension (gaseous): Secondary | ICD-10-CM | POA: Diagnosis not present

## 2020-11-27 DIAGNOSIS — M545 Low back pain, unspecified: Secondary | ICD-10-CM | POA: Diagnosis not present

## 2020-11-27 DIAGNOSIS — K9189 Other postprocedural complications and disorders of digestive system: Secondary | ICD-10-CM

## 2020-11-27 DIAGNOSIS — I4891 Unspecified atrial fibrillation: Secondary | ICD-10-CM

## 2020-11-27 DIAGNOSIS — G8929 Other chronic pain: Secondary | ICD-10-CM

## 2020-11-27 DIAGNOSIS — K567 Ileus, unspecified: Secondary | ICD-10-CM | POA: Diagnosis not present

## 2020-11-27 HISTORY — PX: APPLICATION OF WOUND VAC: SHX5189

## 2020-11-27 HISTORY — PX: LUMBAR WOUND DEBRIDEMENT: SHX1988

## 2020-11-27 LAB — CBC WITH DIFFERENTIAL/PLATELET
Abs Immature Granulocytes: 0.04 10*3/uL (ref 0.00–0.07)
Basophils Absolute: 0 10*3/uL (ref 0.0–0.1)
Basophils Relative: 0 %
Eosinophils Absolute: 0.1 10*3/uL (ref 0.0–0.5)
Eosinophils Relative: 1 %
HCT: 28.7 % — ABNORMAL LOW (ref 39.0–52.0)
Hemoglobin: 10 g/dL — ABNORMAL LOW (ref 13.0–17.0)
Immature Granulocytes: 1 %
Lymphocytes Relative: 7 %
Lymphs Abs: 0.4 10*3/uL — ABNORMAL LOW (ref 0.7–4.0)
MCH: 32.1 pg (ref 26.0–34.0)
MCHC: 34.8 g/dL (ref 30.0–36.0)
MCV: 92 fL (ref 80.0–100.0)
Monocytes Absolute: 0.7 10*3/uL (ref 0.1–1.0)
Monocytes Relative: 12 %
Neutro Abs: 4.6 10*3/uL (ref 1.7–7.7)
Neutrophils Relative %: 79 %
Platelets: 77 10*3/uL — ABNORMAL LOW (ref 150–400)
RBC: 3.12 MIL/uL — ABNORMAL LOW (ref 4.22–5.81)
RDW: 13 % (ref 11.5–15.5)
WBC: 5.8 10*3/uL (ref 4.0–10.5)
nRBC: 0 % (ref 0.0–0.2)

## 2020-11-27 LAB — COMPREHENSIVE METABOLIC PANEL
ALT: 77 U/L — ABNORMAL HIGH (ref 0–44)
AST: 393 U/L — ABNORMAL HIGH (ref 15–41)
Albumin: 2.5 g/dL — ABNORMAL LOW (ref 3.5–5.0)
Alkaline Phosphatase: 50 U/L (ref 38–126)
Anion gap: 14 (ref 5–15)
BUN: 23 mg/dL (ref 8–23)
CO2: 21 mmol/L — ABNORMAL LOW (ref 22–32)
Calcium: 8.6 mg/dL — ABNORMAL LOW (ref 8.9–10.3)
Chloride: 95 mmol/L — ABNORMAL LOW (ref 98–111)
Creatinine, Ser: 1.05 mg/dL (ref 0.61–1.24)
GFR, Estimated: >60 mL/min (ref >60)
Glucose, Bld: 128 mg/dL — ABNORMAL HIGH (ref 70–99)
Potassium: 4.4 mmol/L (ref 3.5–5.1)
Sodium: 130 mmol/L — ABNORMAL LOW (ref 135–145)
Total Bilirubin: 1 mg/dL (ref 0.3–1.2)
Total Protein: 5.9 g/dL — ABNORMAL LOW (ref 6.5–8.1)

## 2020-11-27 LAB — CULTURE, BLOOD (ROUTINE X 2)
Special Requests: ADEQUATE
Special Requests: ADEQUATE

## 2020-11-27 LAB — GLUCOSE, CAPILLARY
Glucose-Capillary: 134 mg/dL — ABNORMAL HIGH (ref 70–99)
Glucose-Capillary: 139 mg/dL — ABNORMAL HIGH (ref 70–99)
Glucose-Capillary: 139 mg/dL — ABNORMAL HIGH (ref 70–99)
Glucose-Capillary: 142 mg/dL — ABNORMAL HIGH (ref 70–99)
Glucose-Capillary: 159 mg/dL — ABNORMAL HIGH (ref 70–99)
Glucose-Capillary: 181 mg/dL — ABNORMAL HIGH (ref 70–99)
Glucose-Capillary: 191 mg/dL — ABNORMAL HIGH (ref 70–99)
Glucose-Capillary: 192 mg/dL — ABNORMAL HIGH (ref 70–99)

## 2020-11-27 LAB — SURGICAL PCR SCREEN
MRSA, PCR: NEGATIVE
Staphylococcus aureus: POSITIVE — AB

## 2020-11-27 LAB — LACTIC ACID, PLASMA
Lactic Acid, Venous: 1.8 mmol/L (ref 0.5–1.9)
Lactic Acid, Venous: 1.9 mmol/L (ref 0.5–1.9)

## 2020-11-27 LAB — MAGNESIUM: Magnesium: 1.9 mg/dL (ref 1.7–2.4)

## 2020-11-27 LAB — LIPASE, BLOOD: Lipase: 48 U/L (ref 11–51)

## 2020-11-27 LAB — BRAIN NATRIURETIC PEPTIDE: B Natriuretic Peptide: 249.2 pg/mL — ABNORMAL HIGH (ref 0.0–100.0)

## 2020-11-27 LAB — TSH: TSH: 3.489 u[IU]/mL (ref 0.350–4.500)

## 2020-11-27 LAB — PHOSPHORUS: Phosphorus: 2.4 mg/dL — ABNORMAL LOW (ref 2.5–4.6)

## 2020-11-27 IMAGING — US US ABDOMEN LIMITED
1 series · 14 of 25 positions shown · non-contrast
Comparison: [DATE] CT

CLINICAL DATA: Elevated LFTs and abdominal pain

EXAM:
ULTRASOUND ABDOMEN LIMITED RIGHT UPPER QUADRANT

[Series 1: us abdomen limited · 14 of 33 slices shown]
[im 1/33]
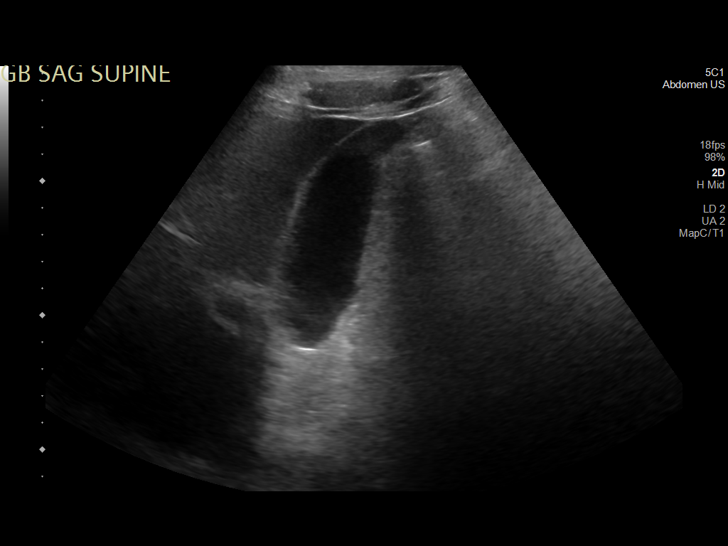
[im 3/33]
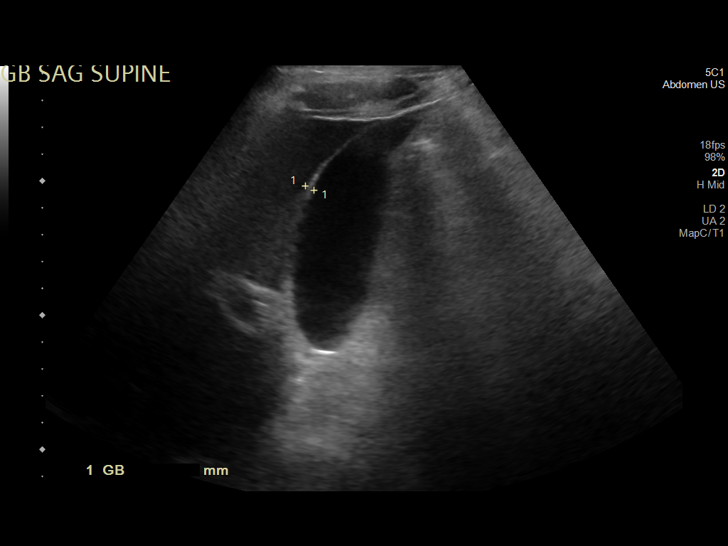
[im 6/33]
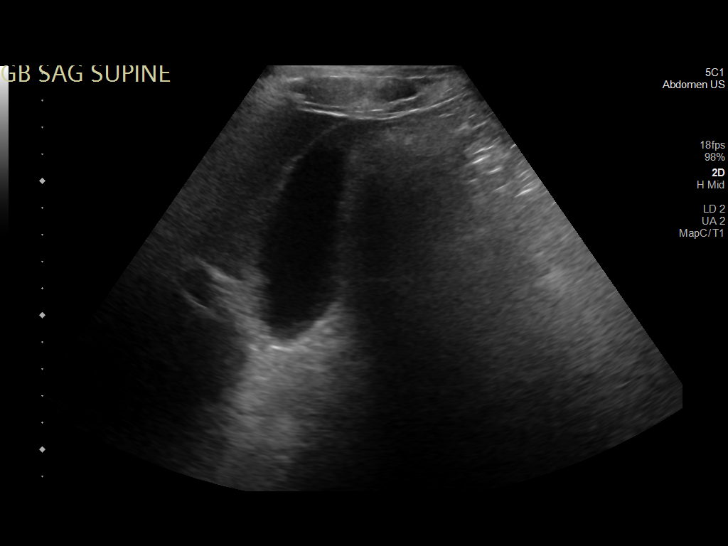
[im 9/33]
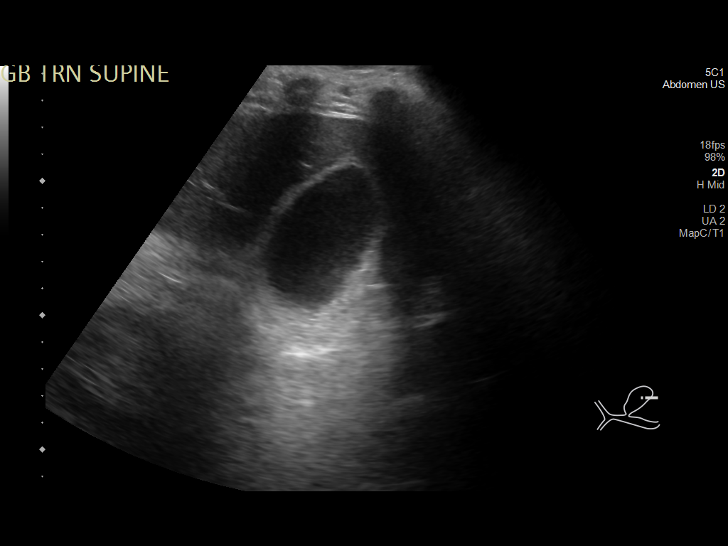
[im 11/33]
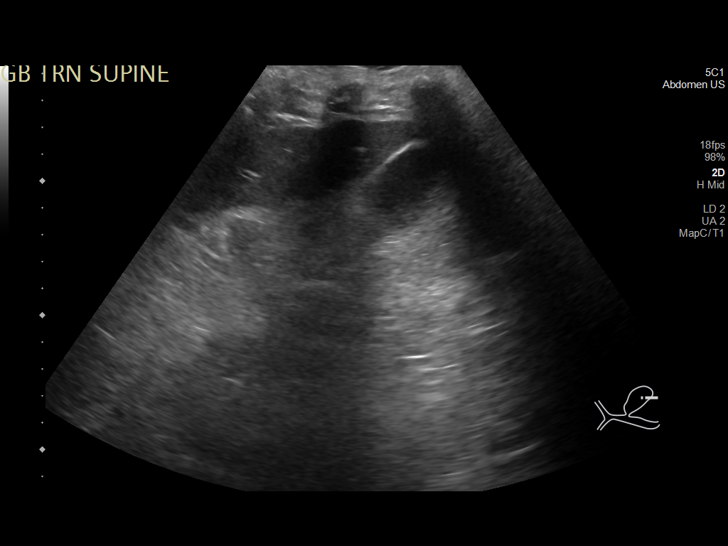
[im 13/33]
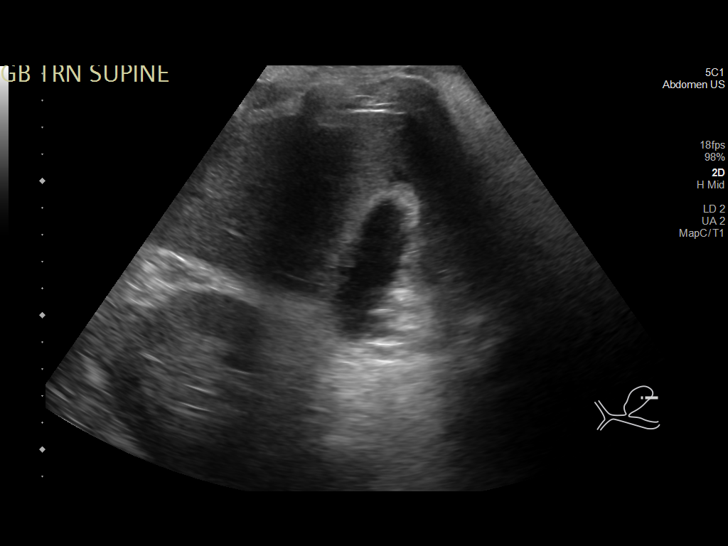
[im 15/33]
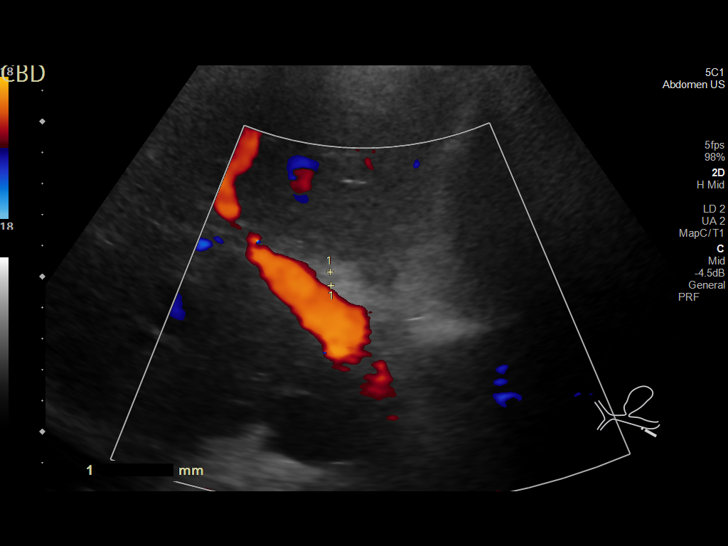
[im 18/33]
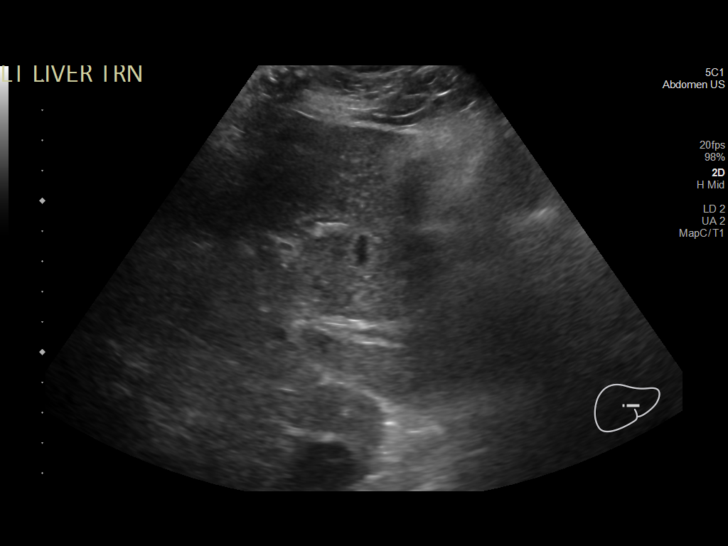
[im 21/33]
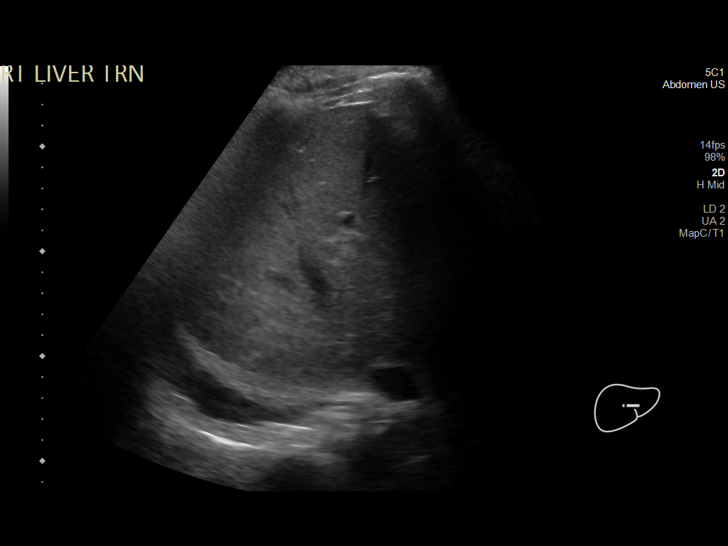
[im 22/33]
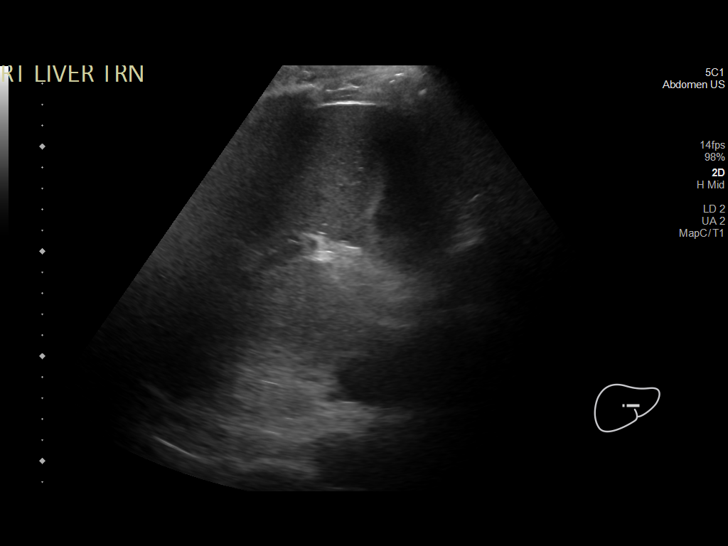
[im 25/33]
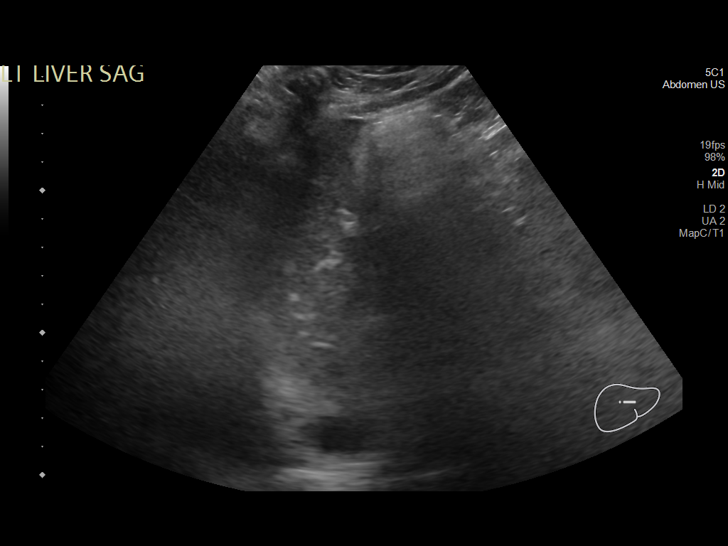
[im 27/33]
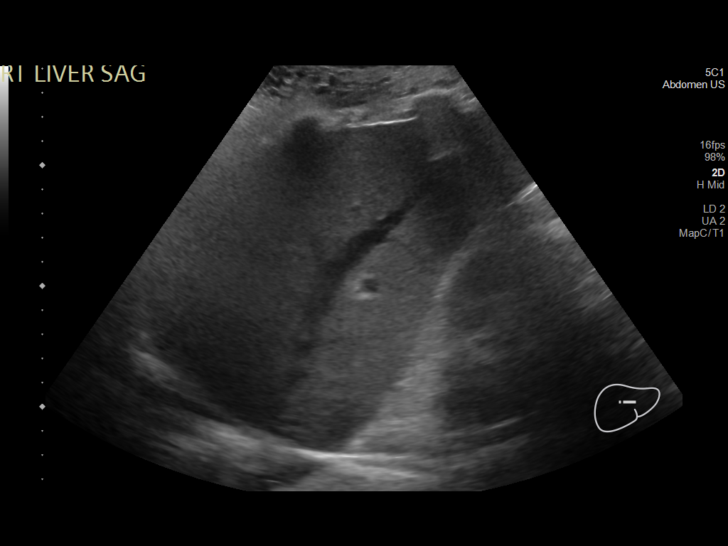
[im 30/33]
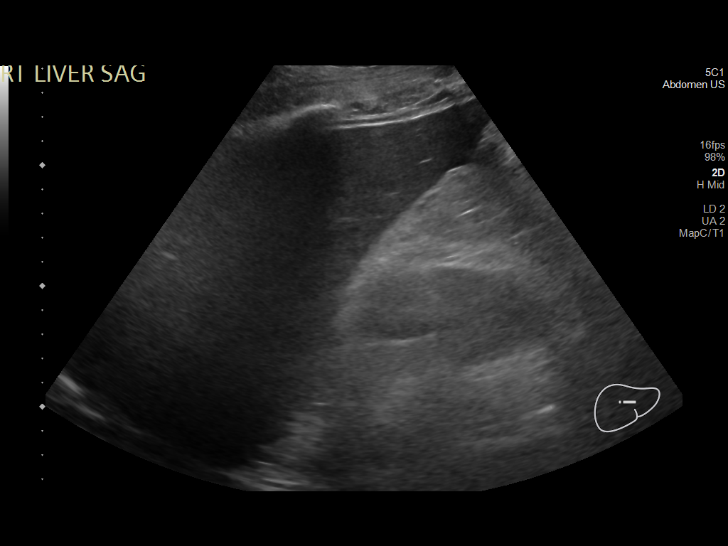
[im 33/33]
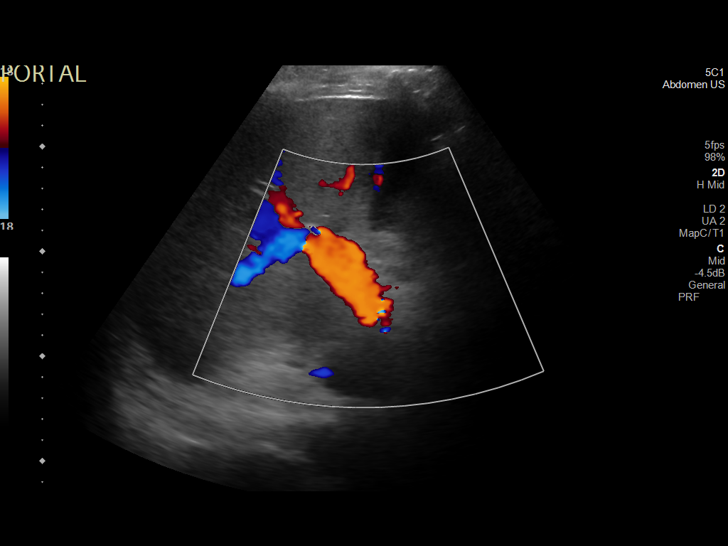

[14 of 25 positions shown; findings below may reference images not displayed]

FINDINGS: Gallbladder:

No gallstones or wall thickening visualized. No sonographic Murphy
sign noted by sonographer.

Common bile duct:

Diameter: 4.36 mm

Liver:

Hypodensity seen on recent CT examination is not well appreciated on
today's exam. Portal vein is patent on color Doppler imaging with
normal direction of blood flow towards the liver.

Other: Minimal free fluid is noted in the right upper quadrant.

Right-sided pleural effusion is noted.
IMPRESSION: Minimal free fluid in the abdomen

Small right-sided pleural effusion.

No other focal abnormality is noted.

## 2020-11-27 IMAGING — DX DG CHEST 1V PORT SAME DAY
1 series · 1 of 1 positions shown · non-contrast
Comparison: [DATE]

CLINICAL DATA: Dyspnea.

EXAM:
PORTABLE CHEST 1 VIEW

[chest ap]
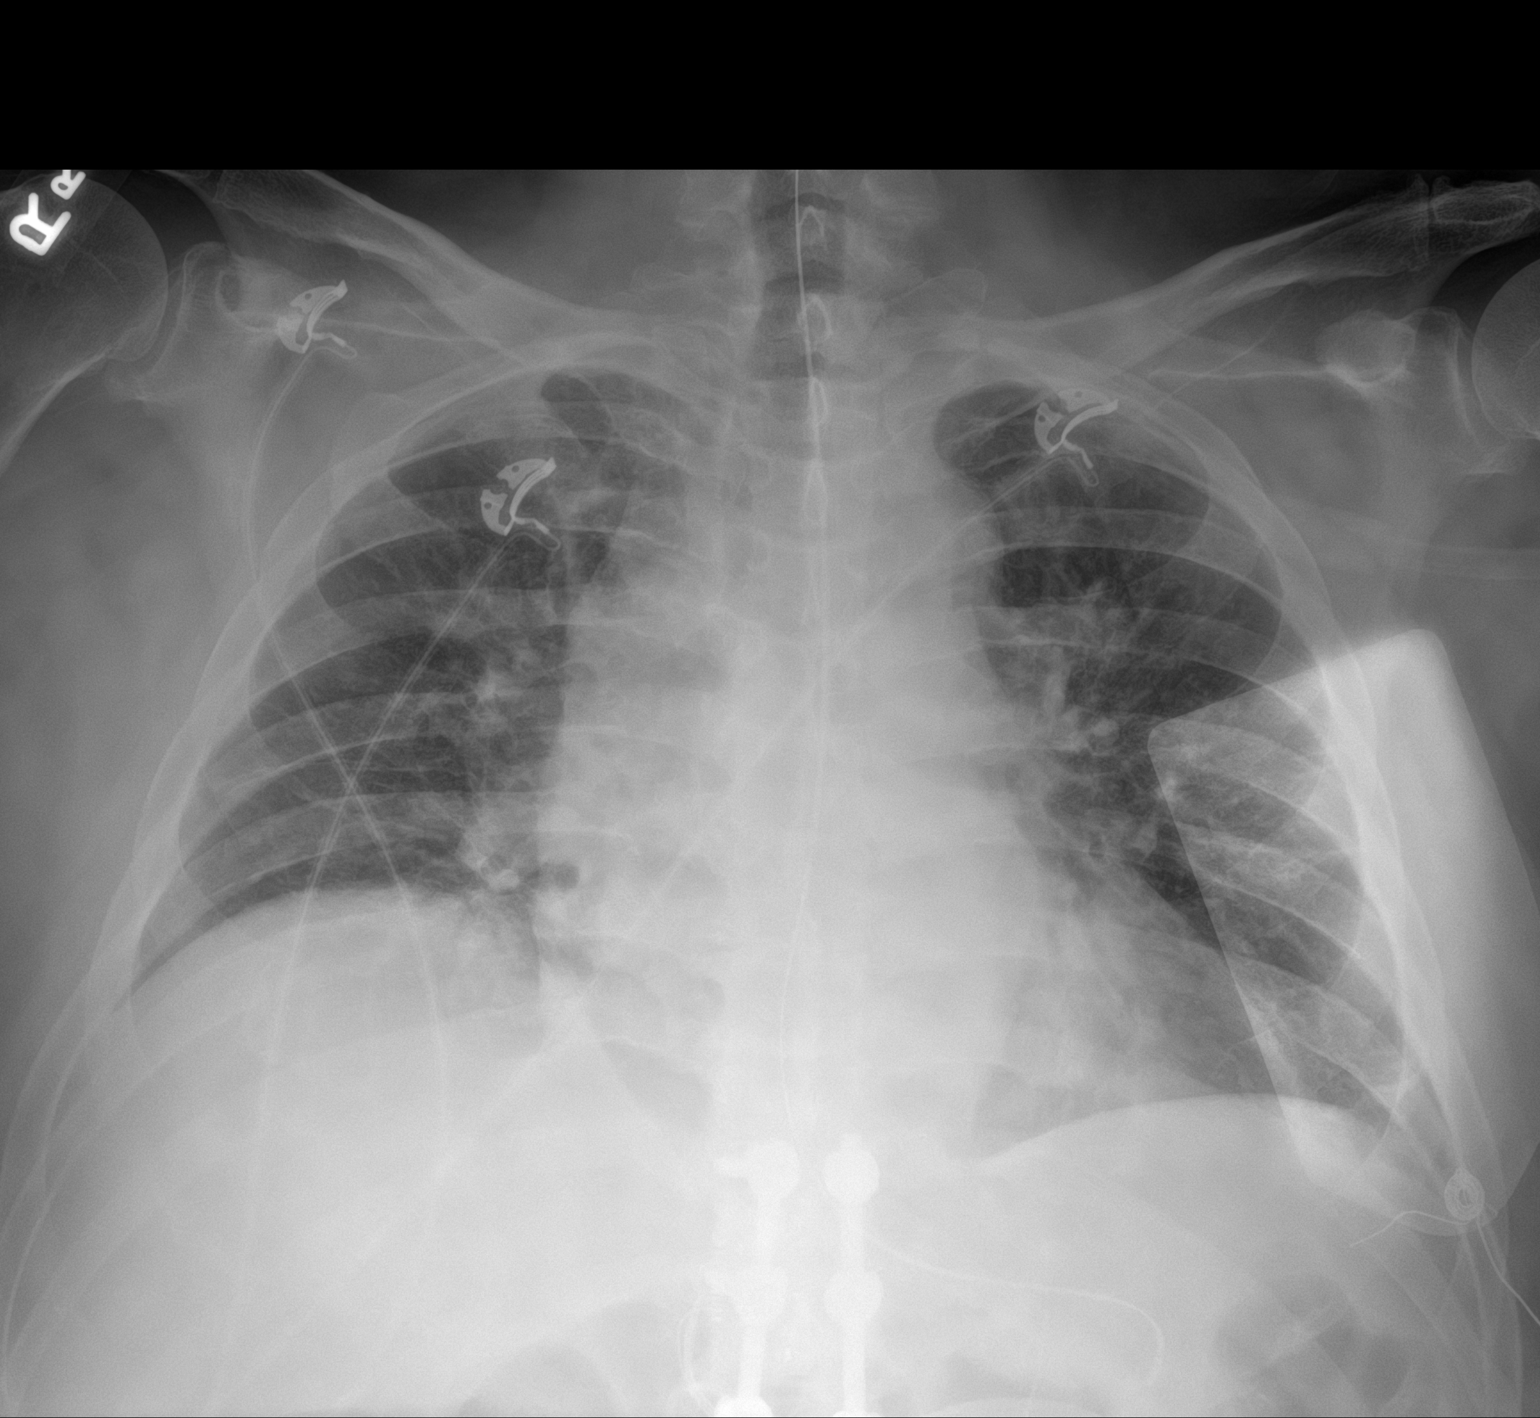

[1 of 1 positions shown; findings below may reference images not displayed]

FINDINGS: There is a nasogastric tube with tip below the GE junction. Stable
cardiomediastinal contours. Low lung volumes with asymmetric
elevation of the right hemidiaphragm. No airspace opacities. No
pleural effusion or edema.
IMPRESSION: Low lung volumes.  No acute findings.

## 2020-11-27 IMAGING — DX DG ABD PORTABLE 1V
1 series · 2 of 2 positions shown · non-contrast
Comparison: [DATE]

CLINICAL DATA: Follow-up ileus

EXAM:
PORTABLE ABDOMEN - 1 VIEW

[Series 1: abdomen · 0.14mm/px · 2 of 2 slices shown]
[im 1/2]
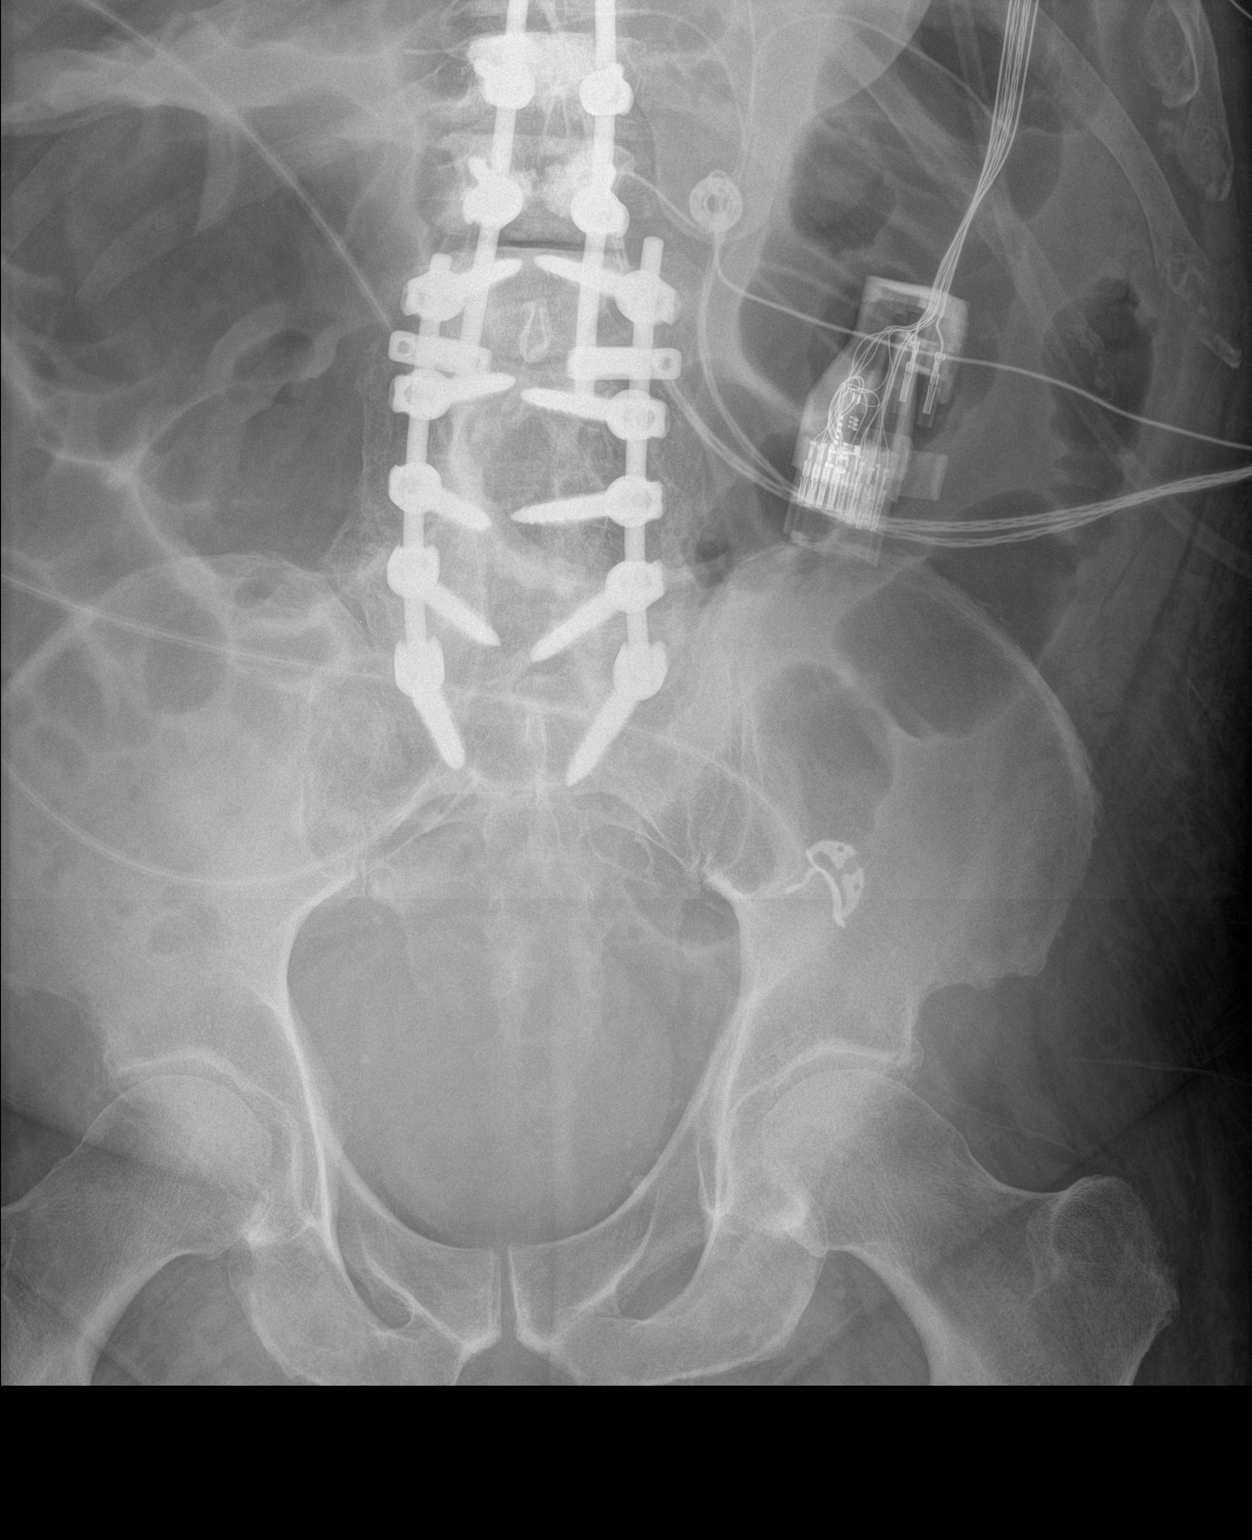
[im 2/2]
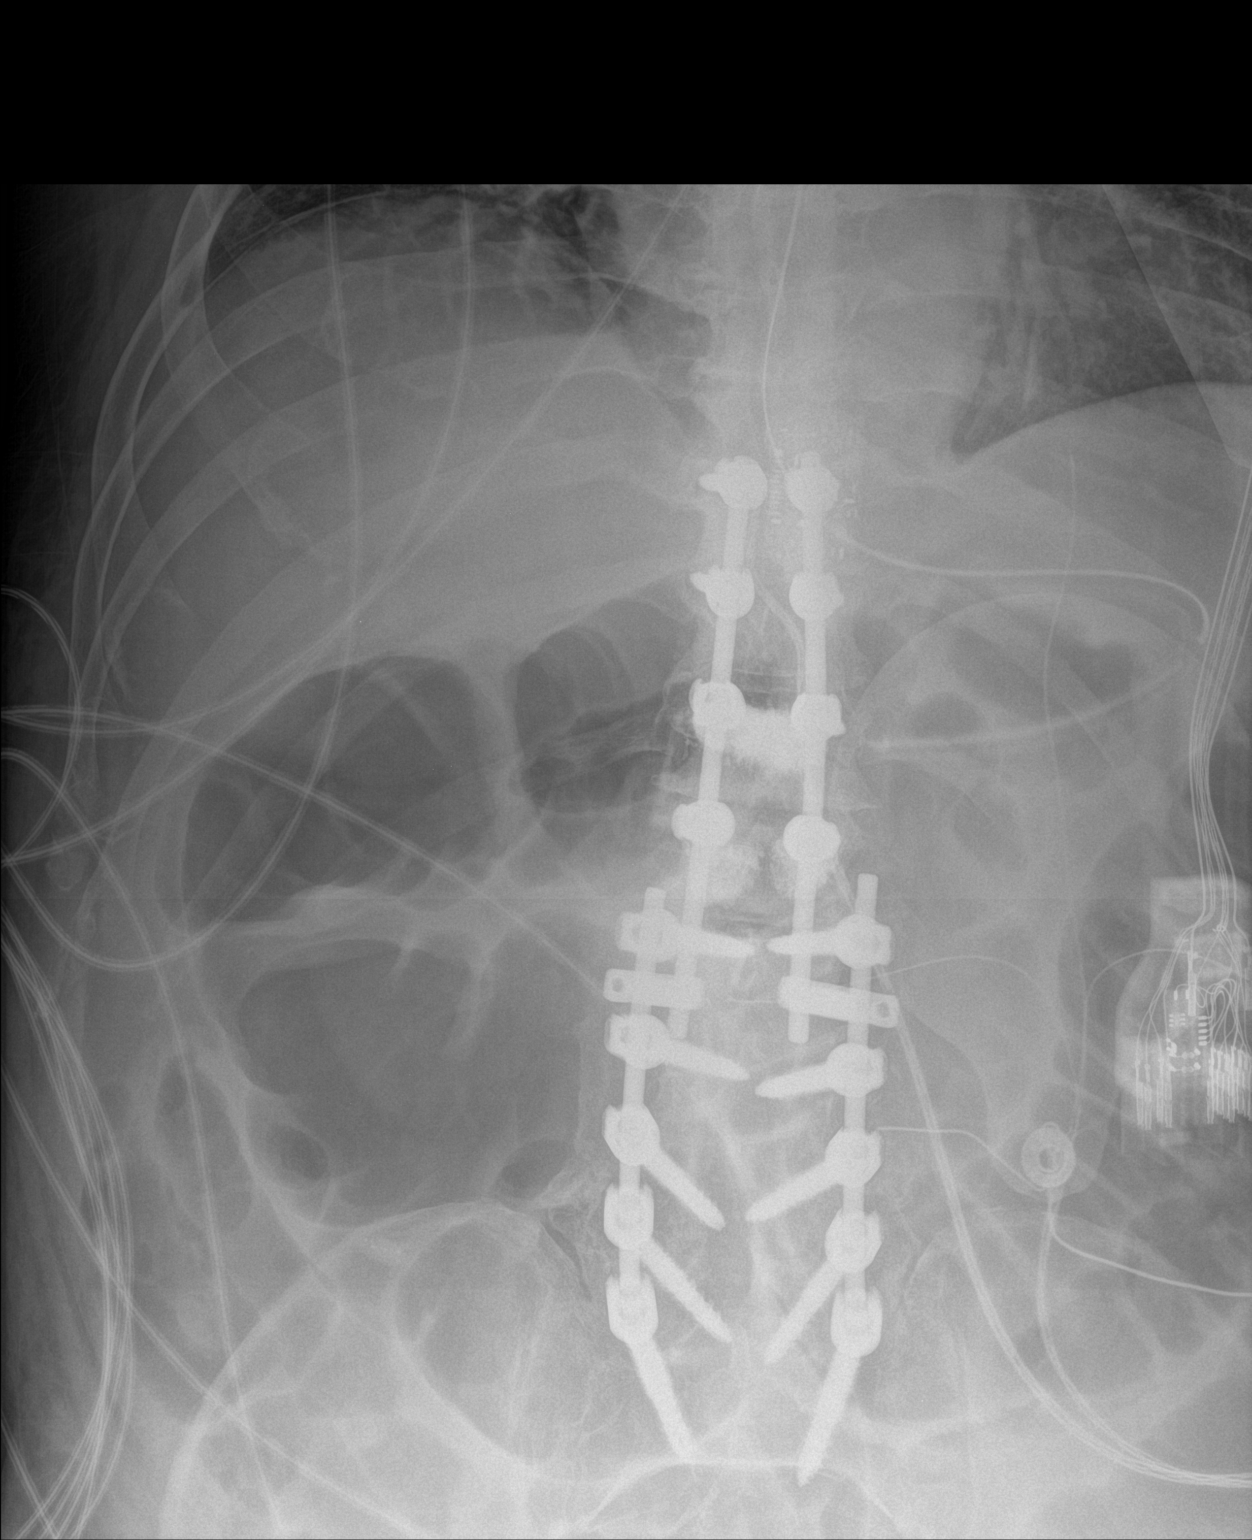

[2 of 2 positions shown; findings below may reference images not displayed]

FINDINGS: Similar ileus pattern with gas in small and large bowel. Nasogastric
tube enters the stomach. Previously seen rectal tube has been
removed. No finding to suggest free air on the supine images.
IMPRESSION: Persistent similar ileus pattern.

## 2020-11-27 SURGERY — LUMBAR WOUND DEBRIDEMENT
Anesthesia: General

## 2020-11-27 MED ORDER — FLEET ENEMA 7-19 GM/118ML RE ENEM
1.0000 | ENEMA | Freq: Once | RECTAL | Status: DC | PRN
  Filled 2020-11-27: qty 1

## 2020-11-27 MED ORDER — FENTANYL CITRATE (PF) 250 MCG/5ML IJ SOLN
INTRAMUSCULAR | Status: AC
  Filled 2020-11-27: qty 5

## 2020-11-27 MED ORDER — HYDROMORPHONE HCL 1 MG/ML IJ SOLN
0.2500 mg | INTRAMUSCULAR | Status: DC | PRN
  Administered 2020-11-27: 0.5 mg via INTRAVENOUS

## 2020-11-27 MED ORDER — LIDOCAINE 2% (20 MG/ML) 5 ML SYRINGE
INTRAMUSCULAR | Status: AC
  Filled 2020-11-27: qty 5

## 2020-11-27 MED ORDER — PROPOFOL 10 MG/ML IV BOLUS
INTRAVENOUS | Status: AC
  Filled 2020-11-27: qty 20

## 2020-11-27 MED ORDER — ROCURONIUM BROMIDE 10 MG/ML (PF) SYRINGE
PREFILLED_SYRINGE | INTRAVENOUS | Status: AC
  Filled 2020-11-27: qty 10

## 2020-11-27 MED ORDER — ONDANSETRON HCL 4 MG/2ML IJ SOLN
INTRAMUSCULAR | Status: AC
  Filled 2020-11-27: qty 2

## 2020-11-27 MED ORDER — DEXAMETHASONE SODIUM PHOSPHATE 10 MG/ML IJ SOLN
INTRAMUSCULAR | Status: DC | PRN
  Administered 2020-11-27: 4 mg via INTRAVENOUS

## 2020-11-27 MED ORDER — VASOPRESSIN 20 UNIT/ML IV SOLN
INTRAVENOUS | Status: AC
  Filled 2020-11-27: qty 1

## 2020-11-27 MED ORDER — BUDESONIDE 0.25 MG/2ML IN SUSP
0.2500 mg | Freq: Four times a day (QID) | RESPIRATORY_TRACT | Status: DC
  Filled 2020-11-27 (×2): qty 2

## 2020-11-27 MED ORDER — ROCURONIUM BROMIDE 10 MG/ML (PF) SYRINGE
PREFILLED_SYRINGE | INTRAVENOUS | Status: DC | PRN
  Administered 2020-11-27: 60 mg via INTRAVENOUS

## 2020-11-27 MED ORDER — SUGAMMADEX SODIUM 200 MG/2ML IV SOLN
INTRAVENOUS | Status: DC | PRN
  Administered 2020-11-27: 100 mg via INTRAVENOUS
  Administered 2020-11-27: 200 mg via INTRAVENOUS

## 2020-11-27 MED ORDER — HYDROMORPHONE HCL 1 MG/ML IJ SOLN
INTRAMUSCULAR | Status: AC
  Filled 2020-11-27: qty 1

## 2020-11-27 MED ORDER — METOPROLOL TARTRATE 5 MG/5ML IV SOLN: 2.5000 mg | Freq: Once | INTRAVENOUS | Status: DC

## 2020-11-27 MED ORDER — MIDAZOLAM HCL 2 MG/2ML IJ SOLN
INTRAMUSCULAR | Status: AC
  Filled 2020-11-27: qty 2

## 2020-11-27 MED ORDER — PROMETHAZINE HCL 25 MG/ML IJ SOLN: 6.2500 mg | INTRAMUSCULAR | Status: DC | PRN

## 2020-11-27 MED ORDER — ALBUTEROL SULFATE HFA 108 (90 BASE) MCG/ACT IN AERS
INHALATION_SPRAY | RESPIRATORY_TRACT | Status: DC | PRN
  Administered 2020-11-27: 4 via RESPIRATORY_TRACT
  Administered 2020-11-27: 2 via RESPIRATORY_TRACT

## 2020-11-27 MED ORDER — SODIUM CHLORIDE 0.9% FLUSH: 3.0000 mL | INTRAVENOUS | Status: DC | PRN

## 2020-11-27 MED ORDER — ESMOLOL HCL 100 MG/10ML IV SOLN
INTRAVENOUS | Status: AC
  Filled 2020-11-27: qty 10

## 2020-11-27 MED ORDER — ONDANSETRON HCL 4 MG PO TABS: 4.0000 mg | ORAL_TABLET | Freq: Four times a day (QID) | ORAL | Status: DC | PRN

## 2020-11-27 MED ORDER — PHENYLEPHRINE HCL (PRESSORS) 10 MG/ML IV SOLN
INTRAVENOUS | Status: DC | PRN
  Administered 2020-11-27: 80 ug via INTRAVENOUS
  Administered 2020-11-27: 40 ug via INTRAVENOUS
  Administered 2020-11-27: 120 ug via INTRAVENOUS
  Administered 2020-11-27 (×2): 80 ug via INTRAVENOUS

## 2020-11-27 MED ORDER — THROMBIN 5000 UNITS EX SOLR
CUTANEOUS | Status: AC
  Filled 2020-11-27: qty 5000

## 2020-11-27 MED ORDER — DOCUSATE SODIUM 100 MG PO CAPS
100.0000 mg | ORAL_CAPSULE | Freq: Two times a day (BID) | ORAL | Status: DC
  Administered 2020-11-28 – 2020-12-06 (×14): 100 mg via ORAL
  Filled 2020-11-27 (×18): qty 1

## 2020-11-27 MED ORDER — MUPIROCIN 2 % EX OINT
TOPICAL_OINTMENT | CUTANEOUS | Status: AC
  Filled 2020-11-27: qty 22

## 2020-11-27 MED ORDER — DILTIAZEM HCL 25 MG/5ML IV SOLN
10.0000 mg | Freq: Once | INTRAVENOUS | Status: AC
  Administered 2020-11-27: 10 mg via INTRAVENOUS
  Filled 2020-11-27: qty 5

## 2020-11-27 MED ORDER — METOPROLOL TARTRATE 5 MG/5ML IV SOLN
INTRAVENOUS | Status: DC | PRN
  Administered 2020-11-27: 1 mg via INTRAVENOUS
  Administered 2020-11-27: 2 mg via INTRAVENOUS
  Administered 2020-11-27 (×2): 1 mg via INTRAVENOUS

## 2020-11-27 MED ORDER — ALBUTEROL SULFATE (2.5 MG/3ML) 0.083% IN NEBU
INHALATION_SOLUTION | RESPIRATORY_TRACT | Status: AC
  Administered 2020-11-27: 2.5 mg
  Filled 2020-11-27: qty 3

## 2020-11-27 MED ORDER — SODIUM CHLORIDE 0.9 % IV SOLN
12.0000 g | INTRAVENOUS | Status: DC
  Administered 2020-11-27 – 2020-12-05 (×9): 12 g via INTRAVENOUS
  Filled 2020-11-27 (×4): qty 12000
  Filled 2020-11-27: qty 4000
  Filled 2020-11-27: qty 6000
  Filled 2020-11-27 (×5): qty 12000

## 2020-11-27 MED ORDER — ONDANSETRON HCL 4 MG/2ML IJ SOLN
INTRAMUSCULAR | Status: DC | PRN
  Administered 2020-11-27: 4 mg via INTRAVENOUS

## 2020-11-27 MED ORDER — LACTATED RINGERS IV SOLN: INTRAVENOUS | Status: DC | PRN

## 2020-11-27 MED ORDER — HYDROMORPHONE HCL 1 MG/ML IJ SOLN: 0.2500 mg | INTRAMUSCULAR | Status: DC | PRN

## 2020-11-27 MED ORDER — ACETAMINOPHEN 325 MG PO TABS
650.0000 mg | ORAL_TABLET | ORAL | Status: DC | PRN
  Administered 2020-12-01: 650 mg via ORAL
  Filled 2020-11-27: qty 2

## 2020-11-27 MED ORDER — ONDANSETRON HCL 4 MG/2ML IJ SOLN: 4.0000 mg | Freq: Four times a day (QID) | INTRAMUSCULAR | Status: DC | PRN

## 2020-11-27 MED ORDER — MIDAZOLAM HCL 2 MG/2ML IJ SOLN
INTRAMUSCULAR | Status: DC | PRN
  Administered 2020-11-27 (×2): 1 mg via INTRAVENOUS

## 2020-11-27 MED ORDER — ADENOSINE 12 MG/4ML IV SOLN
12.0000 mg | Freq: Once | INTRAVENOUS | Status: AC
  Administered 2020-11-27: 12 mg via INTRAVENOUS
  Filled 2020-11-27: qty 4

## 2020-11-27 MED ORDER — PHENYLEPHRINE HCL-NACL 20-0.9 MG/250ML-% IV SOLN
INTRAVENOUS | Status: DC | PRN
  Administered 2020-11-27: 50 ug/min via INTRAVENOUS

## 2020-11-27 MED ORDER — METOPROLOL TARTRATE 5 MG/5ML IV SOLN
INTRAVENOUS | Status: AC
  Administered 2020-11-27: 5 mg via INTRAVENOUS
  Filled 2020-11-27: qty 5

## 2020-11-27 MED ORDER — MENTHOL 3 MG MT LOZG
1.0000 | LOZENGE | OROMUCOSAL | Status: DC | PRN
  Filled 2020-11-27: qty 9

## 2020-11-27 MED ORDER — METOPROLOL TARTRATE 5 MG/5ML IV SOLN
INTRAVENOUS | Status: AC
  Filled 2020-11-27: qty 5

## 2020-11-27 MED ORDER — ADENOSINE 6 MG/2ML IV SOLN
6.0000 mg | Freq: Once | INTRAVENOUS | Status: AC
  Administered 2020-11-27: 6 mg via INTRAVENOUS

## 2020-11-27 MED ORDER — CEFAZOLIN SODIUM-DEXTROSE 2-4 GM/100ML-% IV SOLN
2.0000 g | Freq: Three times a day (TID) | INTRAVENOUS | Status: AC
  Administered 2020-11-27 – 2020-11-28 (×2): 2 g via INTRAVENOUS
  Filled 2020-11-27 (×2): qty 100

## 2020-11-27 MED ORDER — BISACODYL 10 MG RE SUPP: 10.0000 mg | Freq: Every day | RECTAL | Status: DC | PRN

## 2020-11-27 MED ORDER — ACETAMINOPHEN 650 MG RE SUPP: 650.0000 mg | RECTAL | Status: DC | PRN

## 2020-11-27 MED ORDER — SODIUM CHLORIDE 0.9 % IV SOLN
250.0000 mL | INTRAVENOUS | Status: DC
  Administered 2020-11-27 – 2020-11-30 (×2): 250 mL via INTRAVENOUS

## 2020-11-27 MED ORDER — HYDROMORPHONE HCL 1 MG/ML IJ SOLN
1.0000 mg | Freq: Once | INTRAMUSCULAR | Status: AC
  Administered 2020-11-27: 1 mg via INTRAVENOUS

## 2020-11-27 MED ORDER — MUPIROCIN 2 % EX OINT
1.0000 | TOPICAL_OINTMENT | Freq: Two times a day (BID) | CUTANEOUS | Status: AC
Start: 2020-11-27 — End: 2020-12-02
  Administered 2020-11-27 – 2020-12-01 (×9): 1 via NASAL
  Filled 2020-11-27: qty 22

## 2020-11-27 MED ORDER — FUROSEMIDE 10 MG/ML IJ SOLN
40.0000 mg | Freq: Once | INTRAMUSCULAR | Status: AC
  Administered 2020-11-27: 40 mg via INTRAVENOUS
  Filled 2020-11-27: qty 4

## 2020-11-27 MED ORDER — POLYETHYLENE GLYCOL 3350 17 G PO PACK: 17.0000 g | PACK | Freq: Every day | ORAL | Status: DC | PRN

## 2020-11-27 MED ORDER — FENTANYL CITRATE (PF) 250 MCG/5ML IJ SOLN
INTRAMUSCULAR | Status: DC | PRN
  Administered 2020-11-27 (×3): 50 ug via INTRAVENOUS

## 2020-11-27 MED ORDER — LIDOCAINE 2% (20 MG/ML) 5 ML SYRINGE
INTRAMUSCULAR | Status: DC | PRN
  Administered 2020-11-27: 80 mg via INTRAVENOUS

## 2020-11-27 MED ORDER — ESMOLOL HCL 100 MG/10ML IV SOLN
INTRAVENOUS | Status: DC | PRN
  Administered 2020-11-27: 40 mg via INTRAVENOUS
  Administered 2020-11-27: 20 mg via INTRAVENOUS
  Administered 2020-11-27: 10 mg via INTRAVENOUS
  Administered 2020-11-27: 40 mg via INTRAVENOUS

## 2020-11-27 MED ORDER — VANCOMYCIN HCL 1000 MG IV SOLR
INTRAVENOUS | Status: AC
  Filled 2020-11-27: qty 20

## 2020-11-27 MED ORDER — DILTIAZEM HCL-DEXTROSE 125-5 MG/125ML-% IV SOLN (PREMIX)
5.0000 mg/h | INTRAVENOUS | Status: DC
Start: 2020-11-27 — End: 2020-11-30
  Administered 2020-11-27: 15 mg/h via INTRAVENOUS
  Administered 2020-11-27: 5 mg/h via INTRAVENOUS
  Administered 2020-11-28 – 2020-11-29 (×5): 20 mg/h via INTRAVENOUS
  Filled 2020-11-27 (×11): qty 125

## 2020-11-27 MED ORDER — METOPROLOL TARTRATE 5 MG/5ML IV SOLN: 5.0000 mg | Freq: Once | INTRAVENOUS | Status: AC

## 2020-11-27 MED ORDER — SENNA 8.6 MG PO TABS
1.0000 | ORAL_TABLET | Freq: Two times a day (BID) | ORAL | Status: DC
Start: 2020-11-27 — End: 2020-12-02
  Administered 2020-11-27 – 2020-12-01 (×8): 8.6 mg
  Filled 2020-11-27 (×7): qty 1

## 2020-11-27 MED ORDER — 0.9 % SODIUM CHLORIDE (POUR BTL) OPTIME
TOPICAL | Status: DC | PRN
  Administered 2020-11-27: 1000 mL

## 2020-11-27 MED ORDER — PROPOFOL 10 MG/ML IV BOLUS
INTRAVENOUS | Status: DC | PRN
  Administered 2020-11-27: 100 mg via INTRAVENOUS

## 2020-11-27 MED ORDER — CHLORHEXIDINE GLUCONATE 0.12 % MT SOLN
OROMUCOSAL | Status: AC
  Filled 2020-11-27: qty 15

## 2020-11-27 MED ORDER — PHENOL 1.4 % MT LIQD: 1.0000 | OROMUCOSAL | Status: DC | PRN

## 2020-11-27 MED ORDER — SODIUM CHLORIDE 0.9% FLUSH
3.0000 mL | Freq: Two times a day (BID) | INTRAVENOUS | Status: DC
  Administered 2020-11-27 – 2020-12-04 (×14): 3 mL via INTRAVENOUS

## 2020-11-27 MED ORDER — LACTATED RINGERS IV BOLUS
1000.0000 mL | Freq: Once | INTRAVENOUS | Status: AC
  Administered 2020-11-27: 1000 mL via INTRAVENOUS

## 2020-11-27 SURGICAL SUPPLY — 49 items
APL SKNCLS STERI-STRIP NONHPOA (GAUZE/BANDAGES/DRESSINGS)
BAG COUNTER SPONGE SURGICOUNT (BAG) ×3 IMPLANT
BAG SPNG CNTER NS LX DISP (BAG) ×2
BENZOIN TINCTURE PRP APPL 2/3 (GAUZE/BANDAGES/DRESSINGS) IMPLANT
BLADE CLIPPER SURG (BLADE) IMPLANT
CABLE BIPOLOR RESECTION CORD (MISCELLANEOUS) ×3 IMPLANT
CANISTER SUCT 3000ML PPV (MISCELLANEOUS) ×3 IMPLANT
CANISTER WOUND CARE 500ML ATS (WOUND CARE) ×3 IMPLANT
CARTRIDGE OIL MAESTRO DRILL (MISCELLANEOUS) IMPLANT
DECANTER SPIKE VIAL GLASS SM (MISCELLANEOUS) ×3 IMPLANT
DIFFUSER DRILL AIR PNEUMATIC (MISCELLANEOUS) ×3 IMPLANT
DRAPE LAPAROTOMY 100X72X124 (DRAPES) ×3 IMPLANT
DRSG VAC ATS SM SENSATRAC (GAUZE/BANDAGES/DRESSINGS) ×3 IMPLANT
DURAPREP 26ML APPLICATOR (WOUND CARE) ×3 IMPLANT
ELECT REM PT RETURN 9FT ADLT (ELECTROSURGICAL) ×3
ELECTRODE REM PT RTRN 9FT ADLT (ELECTROSURGICAL) ×2 IMPLANT
GAUZE 4X4 16PLY ~~LOC~~+RFID DBL (SPONGE) ×3 IMPLANT
GAUZE SPONGE 4X4 12PLY STRL (GAUZE/BANDAGES/DRESSINGS) IMPLANT
GLOVE EXAM NITRILE XL STR (GLOVE) IMPLANT
GLOVE SURG LTX SZ8.5 (GLOVE) ×3 IMPLANT
GLOVE SURG UNDER POLY LF SZ8.5 (GLOVE) ×3 IMPLANT
GOWN STRL REUS W/ TWL LRG LVL3 (GOWN DISPOSABLE) ×2 IMPLANT
GOWN STRL REUS W/ TWL XL LVL3 (GOWN DISPOSABLE) ×2 IMPLANT
GOWN STRL REUS W/TWL 2XL LVL3 (GOWN DISPOSABLE) ×3 IMPLANT
GOWN STRL REUS W/TWL LRG LVL3 (GOWN DISPOSABLE) ×3
GOWN STRL REUS W/TWL XL LVL3 (GOWN DISPOSABLE) ×3
KIT BASIN OR (CUSTOM PROCEDURE TRAY) ×3 IMPLANT
KIT TURNOVER KIT B (KITS) ×3 IMPLANT
MARKER SKIN DUAL TIP RULER LAB (MISCELLANEOUS) ×3 IMPLANT
NEEDLE HYPO 22GX1.5 SAFETY (NEEDLE) ×3 IMPLANT
NS IRRIG 1000ML POUR BTL (IV SOLUTION) ×3 IMPLANT
OIL CARTRIDGE MAESTRO DRILL (MISCELLANEOUS)
PACK LAMINECTOMY NEURO (CUSTOM PROCEDURE TRAY) ×3 IMPLANT
PAD ARMBOARD 7.5X6 YLW CONV (MISCELLANEOUS) ×3 IMPLANT
SPONGE SURGIFOAM ABS GEL SZ50 (HEMOSTASIS) ×3 IMPLANT
SPONGE T-LAP 4X18 ~~LOC~~+RFID (SPONGE) ×3 IMPLANT
STAPLER SKIN PROX WIDE 3.9 (STAPLE) ×3 IMPLANT
STRIP CLOSURE SKIN 1/2X4 (GAUZE/BANDAGES/DRESSINGS) IMPLANT
SUT VIC AB 0 CT1 18XCR BRD8 (SUTURE) IMPLANT
SUT VIC AB 0 CT1 8-18 (SUTURE)
SUT VIC AB 2-0 CP2 18 (SUTURE) IMPLANT
SUT VIC AB 3-0 SH 8-18 (SUTURE) ×3 IMPLANT
SUT VIC AB 4-0 RB1 18 (SUTURE) IMPLANT
SWAB COLLECTION DEVICE MRSA (MISCELLANEOUS) IMPLANT
SWAB CULTURE ESWAB REG 1ML (MISCELLANEOUS) IMPLANT
SYR CONTROL 10ML LL (SYRINGE) ×3 IMPLANT
TOWEL GREEN STERILE (TOWEL DISPOSABLE) ×3 IMPLANT
TOWEL GREEN STERILE FF (TOWEL DISPOSABLE) ×3 IMPLANT
WATER STERILE IRR 1000ML POUR (IV SOLUTION) ×3 IMPLANT

## 2020-11-27 NOTE — Significant Event (Addendum)
Rapid Response Event Note   Reason for Call :  Afib RVR, 130s  Initial Focused Assessment:  Pt lying in bed with eyes closed. Pt will awaken to verbal command. He c/o SOB and denies chest pain. Lungs diminished t/o. Upper airway wheeze noted. ABD large and distended. NGT to suction present. Skin cool to touch.  T-97.2, HR-133, BP-123/90, RR-18, SpO2-99% on 5L [place].   Interventions:  Done PTA RRT: EKG-Afib RVR 5mg  metoprolol given IV x1 10mg  cardizem given IV x1 Cardizem gtt started and is now on 12.5mg   Plan of Care:  Continue to titrate Cardizem gtt per orders. BP q74min while titrating. Continue to monitor pt closely. Call RRT if further assistance needed.   Event Summary:   MD Notified: Dr. Hal Hope notified PTA RRT Call [place], Dyson Sevey Anderson, RN   Update: 0512-Called back to beside as HR has not improved on 15mg  cardizem. HR is now 160s, SBP-130s. Pt c/o ABD and back pain-1mg  dilaudid given at 0334. Pt attached to zoll. 6mg  adenosine given at 0534. Rhythm dropped to 90s aflutter but was shortlived. 12mg  adenosine given at 0536 with the same result. Cardiology on call team consulted and are coming to see pt. Plan: await cardiology input.

## 2020-11-27 NOTE — Consult Note (Signed)
Reason for Consult:lumbar wound ,sepsis Referring Physician: LAMA  Caston Coopersmith is an 65 y.o. adult.  HPI: Patient is a 65 yo individual who had surgery about 2 weeks ago. Admitted to [facility] yesterday with fever and white count. Transferred here and today became septic with hr 160 with good BP but some mental status changes. MRI is not conclusive for abscess but patient has some puss draining from top of incision. The incision itself does not look bad. I probed the incision with suture removal scissors and immediately got a gush of more than 10 cc of puss.  Past Medical History:  Diagnosis Date   Acid reflux    Anxiety    Arrhythmia    paroxysmal supreventricular tachycardia   Arthritis    Complication of anesthesia    Myotonia congenita   Dysrhythmia    Essential hypertension    Family history of adverse reaction to anesthesia    Sister   High cholesterol    History of skin cancer    Myotonia congenita    Type 2 diabetes mellitus ([place])     Past Surgical History:  Procedure Laterality Date   BACK SURGERY     times 2   CARPAL TUNNEL RELEASE     CATARACT EXTRACTION W/PHACO Left 01/31/2019   Procedure: CATARACT EXTRACTION PHACO AND INTRAOCULAR LENS PLACEMENT ([place]);  Surgeon: Baruch Goldmann, MD;  Location: [facility];  Service: Ophthalmology;  Laterality: Left;  CDE: 2.66   NOSE SURGERY     TOTAL KNEE ARTHROPLASTY Right 10/12/2018   Procedure: RIGHT TOTAL KNEE ARTHROPLASTY;  Surgeon: Garald Balding, MD;  Location: [facility];  Service: Orthopedics;  Laterality: Right;    Family History  Problem Relation Age of Onset   Healthy Mother    Healthy Father    Cancer Father     Social History:  reports that he quit smoking about 2 years ago. His smoking use included cigarettes. He has a 30.00 pack-year smoking history. He has never used smokeless tobacco. He reports that he does not currently use alcohol. He reports that he does not use drugs.  Allergies: No Known  Allergies  Medications: I have reviewed the patient's current medications.  Results for orders placed or performed during the hospital encounter of 11/23/20 (from the past 48 hour(s))  Culture, blood (routine x 2)     Status: None (Preliminary result)   Collection Time: 11/25/20  3:42 PM   Specimen: BLOOD LEFT FOREARM  Result Value Ref Range   Specimen Description      BLOOD LEFT FOREARM BOTTLES DRAWN AEROBIC AND ANAEROBIC   Special Requests Blood Culture adequate volume    Culture      NO GROWTH 2 DAYS Performed at [facility], [address] [place], [place] [address]    Report Status PENDING   C-reactive protein     Status: Abnormal   Collection Time: 11/25/20  3:51 PM  Result Value Ref Range   CRP 73.8 (H) <1.0 mg/dL    Comment: RESULTS CONFIRMED BY MANUAL DILUTION Performed at [facility], [address] [place], [place] [address]   Sedimentation rate     Status: Abnormal   Collection Time: 11/25/20  3:52 PM  Result Value Ref Range   Sed Rate 88 (H) 0 - 16 mm/hr    Comment: Performed at [facility], [address], [place], [place] [address]  Culture, blood (routine x 2)     Status: None (Preliminary result)   Collection Time:  11/25/20  3:55 PM   Specimen: Right Antecubital; Blood  Result Value Ref Range   Specimen Description      RIGHT ANTECUBITAL BOTTLES DRAWN AEROBIC AND ANAEROBIC   Special Requests Blood Culture adequate volume    Culture      NO GROWTH 2 DAYS Performed at [facility], [address] [place], [place] [address]    Report Status PENDING   Glucose, capillary     Status: Abnormal   Collection Time: 11/25/20  4:43 PM  Result Value Ref Range   Glucose-Capillary 136 (H) 70 - 99 mg/dL    Comment: Glucose reference range applies only to samples taken after fasting for at least 8 hours.  Glucose, capillary     Status: Abnormal   Collection Time: 11/25/20  9:31 PM  Result Value Ref Range   Glucose-Capillary 154 (H) 70 - 99 mg/dL    Comment: Glucose  reference range applies only to samples taken after fasting for at least 8 hours.   Comment 1 Notify RN    Comment 2 Document in Chart   C-reactive protein     Status: Abnormal   Collection Time: 11/26/20  4:54 AM  Result Value Ref Range   CRP 57.5 (H) <1.0 mg/dL    Comment: RESULTS CONFIRMED BY MANUAL DILUTION Performed at [facility], [address], [place], [place] [address]   Sedimentation rate     Status: Abnormal   Collection Time: 11/26/20  4:54 AM  Result Value Ref Range   Sed Rate 73 (H) 0 - 16 mm/hr    Comment: Performed at [facility], [address] [place], [place] [address]  Comprehensive metabolic panel     Status: Abnormal   Collection Time: 11/26/20  4:54 AM  Result Value Ref Range   Sodium 124 (L) 135 - 145 mmol/L   Potassium 4.5 3.5 - 5.1 mmol/L    Comment: DELTA CHECK NOTED   Chloride 91 (L) 98 - 111 mmol/L   CO2 22 22 - 32 mmol/L   Glucose, Bld 138 (H) 70 - 99 mg/dL    Comment: Glucose reference range applies only to samples taken after fasting for at least 8 hours.   BUN 25 (H) 8 - 23 mg/dL   Creatinine, Ser 1.36 (H) 0.61 - 1.24 mg/dL   Calcium 7.6 (L) 8.9 - 10.3 mg/dL   Total Protein 6.4 (L) 6.5 - 8.1 g/dL   Albumin 2.8 (L) 3.5 - 5.0 g/dL   AST 330 (H) 15 - 41 U/L   ALT 13 0 - 44 U/L   Alkaline Phosphatase 55 38 - 126 U/L   Total Bilirubin 0.8 0.3 - 1.2 mg/dL   GFR, Estimated 58 (L) >60 mL/min    Comment: (NOTE) Calculated using the CKD-EPI Creatinine Equation (2021)    Anion gap 11 5 - 15    Comment: Performed at [facility], [address] [place], [place] [address]  Procalcitonin     Status: None   Collection Time: 11/26/20  4:54 AM  Result Value Ref Range   Procalcitonin 3.95 ng/mL    Comment:        Interpretation: PCT > 2 ng/mL: Systemic infection (sepsis) is likely, unless other causes are known. (NOTE)       Sepsis PCT Algorithm           Lower Respiratory Tract  Infection PCT Algorithm     ----------------------------     ----------------------------         PCT < 0.25 ng/mL                PCT < 0.10 ng/mL          Strongly encourage             Strongly discourage   discontinuation of antibiotics    initiation of antibiotics    ----------------------------     -----------------------------       PCT 0.25 - 0.50 ng/mL            PCT 0.10 - 0.25 ng/mL               OR       >80% decrease in PCT            Discourage initiation of                                            antibiotics      Encourage discontinuation           of antibiotics    ----------------------------     -----------------------------         PCT >= 0.50 ng/mL              PCT 0.26 - 0.50 ng/mL               AND       <80% decrease in PCT              Encourage initiation of                                             antibiotics       Encourage continuation           of antibiotics    ----------------------------     -----------------------------        PCT >= 0.50 ng/mL                  PCT > 0.50 ng/mL               AND         increase in PCT                  Strongly encourage                                      initiation of antibiotics    Strongly encourage escalation           of antibiotics                                     -----------------------------                                           PCT <= 0.25 ng/mL  OR                                        > 80% decrease in PCT                                      Discontinue / Do not initiate                                             antibiotics  Performed at [facility], [address] [place], [place] [address]   CBC with Differential/Platelet     Status: Abnormal   Collection Time: 11/26/20  4:54 AM  Result Value Ref Range   WBC 6.4 4.0 - 10.5 K/uL   RBC 3.09 (L) 4.22 - 5.81 MIL/uL   Hemoglobin 9.8 (L) 13.0 - 17.0 g/dL   HCT 29.5 (L) 39.0 - 52.0 %   MCV 95.5 80.0 - 100.0 fL    MCH 31.7 26.0 - 34.0 pg   MCHC 33.2 30.0 - 36.0 g/dL   RDW 12.8 11.5 - 15.5 %   Platelets 83 (L) 150 - 400 K/uL    Comment: SPECIMEN CHECKED FOR CLOTS Immature Platelet Fraction may be clinically indicated, consider ordering this additional test [id number]    nRBC 0.0 0.0 - 0.2 %   Neutrophils Relative % 73 %   Neutro Abs 5.3 1.7 - 7.7 K/uL   Band Neutrophils 10 %   Lymphocytes Relative 10 %   Lymphs Abs 0.6 (L) 0.7 - 4.0 K/uL   Monocytes Relative 4 %   Monocytes Absolute 0.3 0.1 - 1.0 K/uL   Eosinophils Relative 0 %   Eosinophils Absolute 0.0 0.0 - 0.5 K/uL   Basophils Relative 0 %   Basophils Absolute 0.0 0.0 - 0.1 K/uL   Metamyelocytes Relative 2 %   Myelocytes 1 %    Comment: Performed at [facility], [address], [place], [place] [address]  Glucose, capillary     Status: Abnormal   Collection Time: 11/26/20  7:17 AM  Result Value Ref Range   Glucose-Capillary 152 (H) 70 - 99 mg/dL    Comment: Glucose reference range applies only to samples taken after fasting for at least 8 hours.  Glucose, capillary     Status: Abnormal   Collection Time: 11/26/20 11:07 AM  Result Value Ref Range   Glucose-Capillary 125 (H) 70 - 99 mg/dL    Comment: Glucose reference range applies only to samples taken after fasting for at least 8 hours.  Glucose, capillary     Status: Abnormal   Collection Time: 11/26/20  1:20 PM  Result Value Ref Range   Glucose-Capillary 138 (H) 70 - 99 mg/dL    Comment: Glucose reference range applies only to samples taken after fasting for at least 8 hours.  Urinalysis, Routine w reflex microscopic Urine, Clean Catch     Status: Abnormal   Collection Time: 11/26/20  3:31 PM  Result Value Ref Range   Color, Urine YELLOW YELLOW   APPearance CLOUDY (A) CLEAR   Specific Gravity, Urine 1.023 1.005 - 1.030   pH 5.0 5.0 - 8.0   Glucose, UA NEGATIVE NEGATIVE mg/dL   Hgb urine  dipstick MODERATE (A) NEGATIVE   Bilirubin Urine NEGATIVE NEGATIVE   Ketones, ur 20 (A)  NEGATIVE mg/dL   Protein, ur 100 (A) NEGATIVE mg/dL   Nitrite NEGATIVE NEGATIVE   Leukocytes,Ua NEGATIVE NEGATIVE   RBC / HPF 11-20 0 - 5 RBC/hpf   WBC, UA 6-10 0 - 5 WBC/hpf   Bacteria, UA RARE (A) NONE SEEN   Squamous Epithelial / LPF 0-5 0 - 5   Mucus PRESENT    Hyaline Casts, UA PRESENT     Comment: Performed at [facility], [address], [place], [place] [address]  Osmolality, urine     Status: None   Collection Time: 11/26/20  3:31 PM  Result Value Ref Range   Osmolality, Ur 509 300 - 900 mOsm/kg    Comment: Performed at [facility] [address], [place], [place] [address]  Sodium, urine, random     Status: None   Collection Time: 11/26/20  3:31 PM  Result Value Ref Range   Sodium, Ur <10 mmol/L    Comment: Performed at [facility], [address] [place], [place] [address]  Chloride, urine, random     Status: None   Collection Time: 11/26/20  3:31 PM  Result Value Ref Range   Chloride Urine <15 mmol/L    Comment: Performed at [facility], [address], [place], [place] [address]  Glucose, capillary     Status: Abnormal   Collection Time: 11/26/20  4:16 PM  Result Value Ref Range   Glucose-Capillary 133 (H) 70 - 99 mg/dL    Comment: Glucose reference range applies only to samples taken after fasting for at least 8 hours.  Glucose, capillary     Status: Abnormal   Collection Time: 11/26/20  8:13 PM  Result Value Ref Range   Glucose-Capillary 113 (H) 70 - 99 mg/dL    Comment: Glucose reference range applies only to samples taken after fasting for at least 8 hours.  Glucose, capillary     Status: Abnormal   Collection Time: 11/27/20 12:00 AM  Result Value Ref Range   Glucose-Capillary 134 (H) 70 - 99 mg/dL    Comment: Glucose reference range applies only to samples taken after fasting for at least 8 hours.  CBC with Differential/Platelet     Status: Abnormal   Collection Time: 11/27/20  3:48 AM  Result Value Ref Range   WBC 5.8 4.0 - 10.5 K/uL   RBC 3.12  (L) 4.22 - 5.81 MIL/uL   Hemoglobin 10.0 (L) 13.0 - 17.0 g/dL   HCT 28.7 (L) 39.0 - 52.0 %   MCV 92.0 80.0 - 100.0 fL   MCH 32.1 26.0 - 34.0 pg   MCHC 34.8 30.0 - 36.0 g/dL   RDW 13.0 11.5 - 15.5 %   Platelets 77 (L) 150 - 400 K/uL    Comment: Immature Platelet Fraction may be clinically indicated, consider ordering this additional test [id number] REPEATED TO VERIFY PLATELET COUNT CONFIRMED BY SMEAR    nRBC 0.0 0.0 - 0.2 %   Neutrophils Relative % 79 %   Neutro Abs 4.6 1.7 - 7.7 K/uL   Lymphocytes Relative 7 %   Lymphs Abs 0.4 (L) 0.7 - 4.0 K/uL   Monocytes Relative 12 %   Monocytes Absolute 0.7 0.1 - 1.0 K/uL   Eosinophils Relative 1 %   Eosinophils Absolute 0.1 0.0 - 0.5 K/uL   Basophils Relative 0 %   Basophils Absolute 0.0 0.0 - 0.1 K/uL   Immature  Granulocytes 1 %   Abs Immature Granulocytes 0.04 0.00 - 0.07 K/uL    Comment: Performed at [facility], [place] [address], [place], [place] [address]  Comprehensive metabolic panel     Status: Abnormal   Collection Time: 11/27/20  3:48 AM  Result Value Ref Range   Sodium 130 (L) 135 - 145 mmol/L   Potassium 4.4 3.5 - 5.1 mmol/L   Chloride 95 (L) 98 - 111 mmol/L   CO2 21 (L) 22 - 32 mmol/L   Glucose, Bld 128 (H) 70 - 99 mg/dL    Comment: Glucose reference range applies only to samples taken after fasting for at least 8 hours.   BUN 23 8 - 23 mg/dL   Creatinine, Ser 1.05 0.61 - 1.24 mg/dL   Calcium 8.6 (L) 8.9 - 10.3 mg/dL   Total Protein 5.9 (L) 6.5 - 8.1 g/dL   Albumin 2.5 (L) 3.5 - 5.0 g/dL   AST 393 (H) 15 - 41 U/L   ALT 77 (H) 0 - 44 U/L   Alkaline Phosphatase 50 38 - 126 U/L   Total Bilirubin 1.0 0.3 - 1.2 mg/dL   GFR, Estimated >60 >60 mL/min    Comment: (NOTE) Calculated using the CKD-EPI Creatinine Equation (2021)    Anion gap 14 5 - 15    Comment: Performed at [facility], [place] [address], [place], [place] [address]  Glucose, capillary     Status: Abnormal   Collection Time: 11/27/20  3:57 AM   Result Value Ref Range   Glucose-Capillary 139 (H) 70 - 99 mg/dL    Comment: Glucose reference range applies only to samples taken after fasting for at least 8 hours.  Glucose, capillary     Status: Abnormal   Collection Time: 11/27/20  6:40 AM  Result Value Ref Range   Glucose-Capillary 142 (H) 70 - 99 mg/dL    Comment: Glucose reference range applies only to samples taken after fasting for at least 8 hours.  Glucose, capillary     Status: Abnormal   Collection Time: 11/27/20  7:32 AM  Result Value Ref Range   Glucose-Capillary 139 (H) 70 - 99 mg/dL    Comment: Glucose reference range applies only to samples taken after fasting for at least 8 hours.   Comment 1 Notify RN    Comment 2 Document in Chart   Brain natriuretic peptide     Status: Abnormal   Collection Time: 11/27/20  8:51 AM  Result Value Ref Range   B Natriuretic Peptide 249.2 (H) 0.0 - 100.0 pg/mL    Comment: Performed at [facility] [address] [place], [place] [address]  Lactic acid, plasma     Status: None   Collection Time: 11/27/20  8:51 AM  Result Value Ref Range   Lactic Acid, Venous 1.8 0.5 - 1.9 mmol/L    Comment: Performed at [facility] [address], [place], [place] [address]  Magnesium     Status: None   Collection Time: 11/27/20  8:51 AM  Result Value Ref Range   Magnesium 1.9 1.7 - 2.4 mg/dL    Comment: Performed at [facility] [address], [place], [place] [address]  Phosphorus     Status: Abnormal   Collection Time: 11/27/20  8:51 AM  Result Value Ref Range   Phosphorus 2.4 (L) 2.5 - 4.6 mg/dL    Comment: Performed at [facility] [address],  [place], [place] [address]  TSH     Status: None   Collection Time: 11/27/20  8:51 AM  Result Value Ref Range   TSH 3.489 0.350 - 4.500 uIU/mL    Comment: Performed by a 3rd Generation assay with a functional sensitivity of <=0.01 uIU/mL. Performed at [facility], [place] [address] [place], [place]  [address]   Lactic acid, plasma     Status: None   Collection Time: 11/27/20 10:13 AM  Result Value Ref Range   Lactic Acid, Venous 1.9 0.5 - 1.9 mmol/L    Comment: Performed at [facility] [address] [place], [place] [address]  Glucose, capillary     Status: Abnormal   Collection Time: 11/27/20 12:45 PM  Result Value Ref Range   Glucose-Capillary 181 (H) 70 - 99 mg/dL    Comment: Glucose reference range applies only to samples taken after fasting for at least 8 hours.   Comment 1 Notify RN    Comment 2 Document in Chart     DG Abd 1 View  Result Date: 11/26/2020 CLINICAL DATA:  Constipation with blood in stool EXAM: ABDOMEN - 1 VIEW COMPARISON:  11/25/2020 FINDINGS: Rectal catheter is in place. Nasogastric tube is in place. There is gas within small and large bowel which could be due to ileus or pseudo-obstruction of the elderly. Extensive previous spinal fusion with multiple spinal augmentations as seen previously. IMPRESSION: Rectal tube now in place. Intestinal gas consistent with ileus. No other significant change since yesterday. Electronically Signed   By: Nelson Chimes M.D.   On: 11/26/2020 08:51   DG Abd 1 View  Result Date: 11/25/2020 CLINICAL DATA:  NG tube placement. EXAM: ABDOMEN - 1 VIEW COMPARISON:  Abdominal x-ray 11/25/2020. FINDINGS: Nasogastric tube tip is in the proximal stomach, just beyond the gastroesophageal junction. Again seen is diffuse gaseous distension of the colon. This is slightly decreased when compared to the prior study. Air seen within central nondilated small bowel. The visualized lung bases are clear. Thoracolumbar fusion hardware is again seen. IMPRESSION: 1. Nasogastric tube tip is in the proximal stomach just beyond the gastroesophageal junction. Recommend advancing tube. 2. Gaseous distension of the entire colon is again seen. Degree of distention has slightly improved. Continued follow-up recommended. Electronically Signed   By: Ronney Asters  M.D.   On: 11/25/2020 21:09   MR CERVICAL SPINE WO CONTRAST  Result Date: 11/26/2020 CLINICAL DATA:  Spinal fusion, thoracic, follow up Epidural abscess; Spinal fusion, lumbosacral, follow up EXAM: MRI CERVICAL, THORACIC AND LUMBAR SPINE WITHOUT AND WITH CONTRAST TECHNIQUE: Multiplanar and multiecho pulse sequences of the cervical spine, to include the craniocervical junction and cervicothoracic junction, and thoracic and lumbar spine, were obtained without and with intravenous contrast. CONTRAST:  64mL GADAVIST GADOBUTROL 1 MMOL/ML IV SOLN COMPARISON:  CT myelogram September 03, 2020. FINDINGS: MRI CERVICAL SPINE FINDINGS Significantly motion limited study.  Within this limitation: Alignment: Similar alignment with reversal of the normal cervical lordosis and grade 1 anterolisthesis of C3 on C4, C4 on C5, and C6 on C7. Vertebrae: Motion limited evaluation. Degenerative/discogenic endplate signal changes at multiple levels. Otherwise, no obvious marrow edema to suggest acute fracture or discitis/osteomyelitis. Cord: Motion limited evaluation without definite cord signal abnormality. Posterior Fossa, vertebral arteries, paraspinal tissues: Visualized vertebral artery flow voids are maintained. No acute findings in the visualized posterior fossa. There is nonspecific ill-defined edema in the posterior paraspinal soft tissues. Disc levels: C2-C3: No significant disc protrusion, foraminal stenosis, or  canal stenosis. C3-C4: Posterior disc osteophyte complex with left greater than right facet and uncovertebral hypertrophy. Resulting moderate to severe left foraminal stenosis and mild canal stenosis. C4-C5: Posterior disc osteophyte complex with right greater than left facet and uncovertebral hypertrophy. Resulting severe right foraminal stenosis and moderate canal stenosis. Flattening of the ventral cord. C5-C6: Posterior disc osteophyte complex with mild bilateral facet and uncovertebral hypertrophy. Mild canal stenosis  without significant foraminal stenosis. C6-C7: Posterior disc osteophyte complex with central inferiorly dissecting disc protrusion versus calcified PLL (as seen on prior myelogram). Resulting moderate canal stenosis. Bilateral facet and uncovertebral hypertrophy with mild to moderate bilateral foraminal stenosis. C7-T1: Posterior disc osteophyte complex with left greater than right facet and uncovertebral hypertrophy. Central disc protrusion with inferior extension. Resulting mild-to-moderate canal stenosis and mild bilateral foraminal stenosis. MRI THORACIC SPINE FINDINGS Alignment:  Normal. Vertebrae: Interval posterior fusion spanning from T10 inferiorly into the lumbar spine. Nonspecific mild edema within the disc spaces at T10-T11, T11-T12, and T12-L1. Cord:  Normal cord signal. Paraspinal and other soft tissues: Postoperative fluid collection in the posterior paraspinal soft tissues in the lower thoracic spine and lumbar spine (detailed further below). Disc levels: Mild Mild disc bulging at multiple upper to mid thoracic levels. At T7-T8 right paracentral disc protrusion flattens the ventral cord with mild canal stenosis, similar to prior. At T8-T9, disc bulging and endplate spurring results in mild canal stenosis, similar. At T9-T10, evaluation is limited by metallic artifact; however, posterior disc bulging appears to result in mild to moderate canal stenosis, possibly progressed from the prior but poorly characterized. Interval extension of the patient's lumbar fusion superiorly to T10. At T10-T11, the canal appears to be mild to moderately moderately narrowed, similar versus slightly progressed. At T11-T12 the canal appears to be patent. MRI LUMBAR SPINE FINDINGS Motion limited evaluation.  Within this limitation: Segmentation:  Standard. Alignment:  Normal. Vertebrae:  Interval posterior fusion spanning from T10 to S1. Conus medullaris and cauda equina: Conus extends to the L1-L2 level. Conus appears  normal. Paraspinal and other soft tissues: Postoperative edema in the paraspinal soft tissues with fluid collection in the paraspinal soft tissues measuring up to 8 x 3 x 63 mm, centered at T12-L1. There is also nonspecific fluid surrounding the posterior elements bilaterally at L2-L3. Disc levels: T12-L1: Posterior fusion. Mild canal stenosis. Limited evaluation of foramina due to metallic artifact. L1-L2: Posterior fusion and decompression. Improved canal stenosis with patent canal. Limited evaluation of the foramina due to metallic artifact with visualized portions appearing patent. L2-L3: Prior posterior decompression and fusion. Osteophytic ridging. Facet hypertrophy. Mild canal stenosis, similar. Limited evaluation of foramina with likely mild bilateral foraminal stenosis. L3-L4: Prior posterior fusion and decompression. Patent canal and foramina. L4-L5: Prior posterior fusion and decompression. Patent canal with probably mild bilateral foraminal stenosis. L5-S1: Prior posterior fusion and decompression. Patent canal with mild left foraminal stenosis. IMPRESSION: MRI cervical spine: 1. Similar moderate canal stenosis at C4-C5 and C6-C7 2. Similar severe foraminal stenosis on the right at C4-C5 and moderate to severe foraminal stenosis on the left at C3-C4. 3. 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 4. Nonspecific ill-defined edema in the posterior paraspinal soft tissues, which could represent strain or cellulitis. MRI thoracic and lumbar spine: 1. Status post extension of lumbar fusion superiorly to T10. Nonspecific postoperative edema and fluid collection in the posterior paraspinal soft tissues centered at T12, which could represent a seroma but  abscess is not excluded given reported purulent drainage. There also is nonspecific fluid surrounding the posterior elements at L2-L3. 2. Mild edema within the  discs at T10-T11 and T12-L1, which could be degenerative or stress related but early discitis is difficult to exclude given the clinical history. No clearly infectious findings within the canal, although evaluation is limited by metallic artifact and motion. If there is clinical concern for infection, consider correlation with inflammatory markers and close interval follow-up MRI with contrast as clinically indicated. 3. Improved canal stenosis and foraminal stenosis at L1-L2 status post decompression and fusion. 4. Mild to moderate canal stenosis at T9-T10 and T10-T11 appears similar versus slightly progressed. Similar mild canal stenosis at T7-T8. Electronically Signed   By: Margaretha Sheffield M.D.   On: 11/26/2020 16:39   MR THORACIC SPINE W WO CONTRAST  Result Date: 11/26/2020 CLINICAL DATA:  Spinal fusion, thoracic, follow up Epidural abscess; Spinal fusion, lumbosacral, follow up EXAM: MRI CERVICAL, THORACIC AND LUMBAR SPINE WITHOUT AND WITH CONTRAST TECHNIQUE: Multiplanar and multiecho pulse sequences of the cervical spine, to include the craniocervical junction and cervicothoracic junction, and thoracic and lumbar spine, were obtained without and with intravenous contrast. CONTRAST:  10mL GADAVIST GADOBUTROL 1 MMOL/ML IV SOLN COMPARISON:  CT myelogram September 03, 2020. FINDINGS: MRI CERVICAL SPINE FINDINGS Significantly motion limited study.  Within this limitation: Alignment: Similar alignment with reversal of the normal cervical lordosis and grade 1 anterolisthesis of C3 on C4, C4 on C5, and C6 on C7. Vertebrae: Motion limited evaluation. Degenerative/discogenic endplate signal changes at multiple levels. Otherwise, no obvious marrow edema to suggest acute fracture or discitis/osteomyelitis. Cord: Motion limited evaluation without definite cord signal abnormality. Posterior Fossa, vertebral arteries, paraspinal tissues: Visualized vertebral artery flow voids are maintained. No acute findings in the  visualized posterior fossa. There is nonspecific ill-defined edema in the posterior paraspinal soft tissues. Disc levels: C2-C3: No significant disc protrusion, foraminal stenosis, or canal stenosis. C3-C4: Posterior disc osteophyte complex with left greater than right facet and uncovertebral hypertrophy. Resulting moderate to severe left foraminal stenosis and mild canal stenosis. C4-C5: Posterior disc osteophyte complex with right greater than left facet and uncovertebral hypertrophy. Resulting severe right foraminal stenosis and moderate canal stenosis. Flattening of the ventral cord. C5-C6: Posterior disc osteophyte complex with mild bilateral facet and uncovertebral hypertrophy. Mild canal stenosis without significant foraminal stenosis. C6-C7: Posterior disc osteophyte complex with central inferiorly dissecting disc protrusion versus calcified PLL (as seen on prior myelogram). Resulting moderate canal stenosis. Bilateral facet and uncovertebral hypertrophy with mild to moderate bilateral foraminal stenosis. C7-T1: Posterior disc osteophyte complex with left greater than right facet and uncovertebral hypertrophy. Central disc protrusion with inferior extension. Resulting mild-to-moderate canal stenosis and mild bilateral foraminal stenosis. MRI THORACIC SPINE FINDINGS Alignment:  Normal. Vertebrae: Interval posterior fusion spanning from T10 inferiorly into the lumbar spine. Nonspecific mild edema within the disc spaces at T10-T11, T11-T12, and T12-L1. Cord:  Normal cord signal. Paraspinal and other soft tissues: Postoperative fluid collection in the posterior paraspinal soft tissues in the lower thoracic spine and lumbar spine (detailed further below). Disc levels: Mild Mild disc bulging at multiple upper to mid thoracic levels. At T7-T8 right paracentral disc protrusion flattens the ventral cord with mild canal stenosis, similar to prior. At T8-T9, disc bulging and endplate spurring results in mild canal  stenosis, similar. At T9-T10, evaluation is limited by metallic artifact; however, posterior disc bulging appears to result in mild to moderate canal stenosis, possibly progressed from the prior  but poorly characterized. Interval extension of the patient's lumbar fusion superiorly to T10. At T10-T11, the canal appears to be mild to moderately moderately narrowed, similar versus slightly progressed. At T11-T12 the canal appears to be patent. MRI LUMBAR SPINE FINDINGS Motion limited evaluation.  Within this limitation: Segmentation:  Standard. Alignment:  Normal. Vertebrae:  Interval posterior fusion spanning from T10 to S1. Conus medullaris and cauda equina: Conus extends to the L1-L2 level. Conus appears normal. Paraspinal and other soft tissues: Postoperative edema in the paraspinal soft tissues with fluid collection in the paraspinal soft tissues measuring up to 8 x 3 x 63 mm, centered at T12-L1. There is also nonspecific fluid surrounding the posterior elements bilaterally at L2-L3. Disc levels: T12-L1: Posterior fusion. Mild canal stenosis. Limited evaluation of foramina due to metallic artifact. L1-L2: Posterior fusion and decompression. Improved canal stenosis with patent canal. Limited evaluation of the foramina due to metallic artifact with visualized portions appearing patent. L2-L3: Prior posterior decompression and fusion. Osteophytic ridging. Facet hypertrophy. Mild canal stenosis, similar. Limited evaluation of foramina with likely mild bilateral foraminal stenosis. L3-L4: Prior posterior fusion and decompression. Patent canal and foramina. L4-L5: Prior posterior fusion and decompression. Patent canal with probably mild bilateral foraminal stenosis. L5-S1: Prior posterior fusion and decompression. Patent canal with mild left foraminal stenosis. IMPRESSION: MRI cervical spine: 1. Similar moderate canal stenosis at C4-C5 and C6-C7 2. Similar severe foraminal stenosis on the right at C4-C5 and moderate to  severe foraminal stenosis on the left at C3-C4. 3. 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 4. Nonspecific ill-defined edema in the posterior paraspinal soft tissues, which could represent strain or cellulitis. MRI thoracic and lumbar spine: 1. Status post extension of lumbar fusion superiorly to T10. Nonspecific postoperative edema and fluid collection in the posterior paraspinal soft tissues centered at T12, which could represent a seroma but abscess is not excluded given reported purulent drainage. There also is nonspecific fluid surrounding the posterior elements at L2-L3. 2. Mild edema within the discs at T10-T11 and T12-L1, which could be degenerative or stress related but early discitis is difficult to exclude given the clinical history. No clearly infectious findings within the canal, although evaluation is limited by metallic artifact and motion. If there is clinical concern for infection, consider correlation with inflammatory markers and close interval follow-up MRI with contrast as clinically indicated. 3. Improved canal stenosis and foraminal stenosis at L1-L2 status post decompression and fusion. 4. Mild to moderate canal stenosis at T9-T10 and T10-T11 appears similar versus slightly progressed. Similar mild canal stenosis at T7-T8. Electronically Signed   By: Margaretha Sheffield M.D.   On: 11/26/2020 16:39   MR Lumbar Spine W Wo Contrast  Result Date: 11/26/2020 CLINICAL DATA:  Spinal fusion, thoracic, follow up Epidural abscess; Spinal fusion, lumbosacral, follow up EXAM: MRI CERVICAL, THORACIC AND LUMBAR SPINE WITHOUT AND WITH CONTRAST TECHNIQUE: Multiplanar and multiecho pulse sequences of the cervical spine, to include the craniocervical junction and cervicothoracic junction, and thoracic and lumbar spine, were obtained without and with intravenous contrast. CONTRAST:  70mL GADAVIST GADOBUTROL 1  MMOL/ML IV SOLN COMPARISON:  CT myelogram September 03, 2020. FINDINGS: MRI CERVICAL SPINE FINDINGS Significantly motion limited study.  Within this limitation: Alignment: Similar alignment with reversal of the normal cervical lordosis and grade 1 anterolisthesis of C3 on C4, C4 on C5, and C6 on C7. Vertebrae: Motion limited evaluation. Degenerative/discogenic endplate signal changes at multiple levels. Otherwise,  no obvious marrow edema to suggest acute fracture or discitis/osteomyelitis. Cord: Motion limited evaluation without definite cord signal abnormality. Posterior Fossa, vertebral arteries, paraspinal tissues: Visualized vertebral artery flow voids are maintained. No acute findings in the visualized posterior fossa. There is nonspecific ill-defined edema in the posterior paraspinal soft tissues. Disc levels: C2-C3: No significant disc protrusion, foraminal stenosis, or canal stenosis. C3-C4: Posterior disc osteophyte complex with left greater than right facet and uncovertebral hypertrophy. Resulting moderate to severe left foraminal stenosis and mild canal stenosis. C4-C5: Posterior disc osteophyte complex with right greater than left facet and uncovertebral hypertrophy. Resulting severe right foraminal stenosis and moderate canal stenosis. Flattening of the ventral cord. C5-C6: Posterior disc osteophyte complex with mild bilateral facet and uncovertebral hypertrophy. Mild canal stenosis without significant foraminal stenosis. C6-C7: Posterior disc osteophyte complex with central inferiorly dissecting disc protrusion versus calcified PLL (as seen on prior myelogram). Resulting moderate canal stenosis. Bilateral facet and uncovertebral hypertrophy with mild to moderate bilateral foraminal stenosis. C7-T1: Posterior disc osteophyte complex with left greater than right facet and uncovertebral hypertrophy. Central disc protrusion with inferior extension. Resulting mild-to-moderate canal stenosis and mild bilateral  foraminal stenosis. MRI THORACIC SPINE FINDINGS Alignment:  Normal. Vertebrae: Interval posterior fusion spanning from T10 inferiorly into the lumbar spine. Nonspecific mild edema within the disc spaces at T10-T11, T11-T12, and T12-L1. Cord:  Normal cord signal. Paraspinal and other soft tissues: Postoperative fluid collection in the posterior paraspinal soft tissues in the lower thoracic spine and lumbar spine (detailed further below). Disc levels: Mild Mild disc bulging at multiple upper to mid thoracic levels. At T7-T8 right paracentral disc protrusion flattens the ventral cord with mild canal stenosis, similar to prior. At T8-T9, disc bulging and endplate spurring results in mild canal stenosis, similar. At T9-T10, evaluation is limited by metallic artifact; however, posterior disc bulging appears to result in mild to moderate canal stenosis, possibly progressed from the prior but poorly characterized. Interval extension of the patient's lumbar fusion superiorly to T10. At T10-T11, the canal appears to be mild to moderately moderately narrowed, similar versus slightly progressed. At T11-T12 the canal appears to be patent. MRI LUMBAR SPINE FINDINGS Motion limited evaluation.  Within this limitation: Segmentation:  Standard. Alignment:  Normal. Vertebrae:  Interval posterior fusion spanning from T10 to S1. Conus medullaris and cauda equina: Conus extends to the L1-L2 level. Conus appears normal. Paraspinal and other soft tissues: Postoperative edema in the paraspinal soft tissues with fluid collection in the paraspinal soft tissues measuring up to 8 x 3 x 63 mm, centered at T12-L1. There is also nonspecific fluid surrounding the posterior elements bilaterally at L2-L3. Disc levels: T12-L1: Posterior fusion. Mild canal stenosis. Limited evaluation of foramina due to metallic artifact. L1-L2: Posterior fusion and decompression. Improved canal stenosis with patent canal. Limited evaluation of the foramina due to  metallic artifact with visualized portions appearing patent. L2-L3: Prior posterior decompression and fusion. Osteophytic ridging. Facet hypertrophy. Mild canal stenosis, similar. Limited evaluation of foramina with likely mild bilateral foraminal stenosis. L3-L4: Prior posterior fusion and decompression. Patent canal and foramina. L4-L5: Prior posterior fusion and decompression. Patent canal with probably mild bilateral foraminal stenosis. L5-S1: Prior posterior fusion and decompression. Patent canal with mild left foraminal stenosis. IMPRESSION: MRI cervical spine: 1. Similar moderate canal stenosis at C4-C5 and C6-C7 2. Similar severe foraminal stenosis on the right at C4-C5 and moderate to severe foraminal stenosis on the left at C3-C4. 3. Similar inferiorly dissecting disc protrusion versus ossified PLL behind the  C7 vertebral body with mild-to-moderate canal stenosis at C7-T1. 4. Nonspecific ill-defined edema in the posterior paraspinal soft tissues, which could represent strain or cellulitis. MRI thoracic and lumbar spine: 1. Status post extension of lumbar fusion superiorly to T10. Nonspecific postoperative edema and fluid collection in the posterior paraspinal soft tissues centered at T12, which could represent a seroma but abscess is not excluded given reported purulent drainage. There also is nonspecific fluid surrounding the posterior elements at L2-L3. 2. Mild edema within the discs at T10-T11 and T12-L1, which could be degenerative or stress related but early discitis is difficult to exclude given the clinical history. No clearly infectious findings within the canal, although evaluation is limited by metallic artifact and motion. If there is clinical concern for infection, consider correlation with inflammatory markers and close interval follow-up MRI with contrast as clinically indicated. 3. Improved canal stenosis and foraminal stenosis at L1-L2 status post decompression and fusion. 4. Mild to  moderate canal stenosis at T9-T10 and T10-T11 appears similar versus slightly progressed. Similar mild canal stenosis at T7-T8. Electronically Signed   By: Margaretha Sheffield M.D.   On: 11/26/2020 16:39   DG Chest Port 1V same Day  Result Date: 11/27/2020 CLINICAL DATA:  Dyspnea. EXAM: PORTABLE CHEST 1 VIEW COMPARISON:  11/25/2020 FINDINGS: There is a nasogastric tube with tip below the GE junction. Stable cardiomediastinal contours. Low lung volumes with asymmetric elevation of the right hemidiaphragm. No airspace opacities. No pleural effusion or edema. IMPRESSION: Low lung volumes.  No acute findings. Electronically Signed   By: Kerby Moors M.D.   On: 11/27/2020 09:01    Review of Systems  Constitutional:  Positive for activity change.  Musculoskeletal:  Positive for back pain.  Neurological: Negative.   Hematological: Negative.   Psychiatric/Behavioral: Negative.    Blood pressure (!) 91/58, pulse (!) 167, temperature 98 F (36.7 C), resp. rate (!) 24, height 5\' 4"  (1.626 m), weight 94.6 kg, SpO2 94 %. Physical Exam Constitutional:      Appearance: He is obese.  HENT:     Head: Normocephalic and atraumatic.     Nose: Nose normal.     Mouth/Throat:     Mouth: Mucous membranes are moist.  Eyes:     Extraocular Movements: Extraocular movements intact.     Pupils: Pupils are equal, round, and reactive to light.  Musculoskeletal:     Comments: Incision is puffy with 60mm area of dehiscence at superior end of incision with puss draining through it.  Neurological:     Mental Status: He is alert.     Comments: Patient is anxious and writhing about uncomfortably moves all 4 extremities well.  Psychiatric:        Thought Content: Thought content normal.    Assessment/Plan: Sepsis with lumbar wound infection 3 weeks past surgery.    Plan surgical debridement emergently.  Blanchie Dessert Valentine Barney 11/27/2020, 1:43 PM

## 2020-11-27 NOTE — Significant Event (Signed)
Patient's heart rate went up to the 140s and the EKG shows A. fib with RVR.  Blood pressure systolic was in the 659D.  Initially metoprolol 5 mg IV did not decrease the heart rate.  I ordered Cardizem 10 mg IV bolus which also did not improve the heart rate.  I have started patient on Cardizem infusion.  Gean Birchwood

## 2020-11-27 NOTE — Progress Notes (Signed)
Patient transferred to the  ICU, report given to [facility] RN

## 2020-11-27 NOTE — Progress Notes (Signed)
Plan to take patient to the OR. Dr. Ellene Route is aware of ordered MRI. Ok not taking to MRI at this time.

## 2020-11-27 NOTE — Transfer of Care (Signed)
Immediate Anesthesia Transfer of Care Note  Patient: Danny Hampton  Procedure(s) Performed: LUMBAR WOUND DEBRIDEMENT AND WASHOUT APPLICATION OF WOUND VAC  Patient Location: PACU  Anesthesia Type:General  Level of Consciousness: drowsy  Airway & Oxygen Therapy: Patient Spontanous Breathing and Patient connected to face mask oxygen  Post-op Assessment: Report given to RN and Post -op Vital signs reviewed and stable  Post vital signs: Reviewed and stable  Last Vitals:  Vitals Value Taken Time  BP 105/80 11/27/20 1643  Temp    Pulse 113 11/27/20 1655  Resp 18 11/27/20 1655  SpO2 93 % 11/27/20 1655  Vitals shown include unvalidated device data.  Last Pain:  Vitals:   11/27/20 1339  TempSrc:   PainSc: 8       Patients Stated Pain Goal: 3 (25/85/27 7824)  Complications: No notable events documented.

## 2020-11-27 NOTE — Anesthesia Preprocedure Evaluation (Addendum)
Anesthesia Evaluation  Patient identified by MRN, date of birth, ID band Patient awake    Reviewed: Allergy & Precautions, NPO status , Patient's Chart, lab work & pertinent test results, reviewed documented beta blocker date and time   History of Anesthesia Complications (+) Family history of anesthesia reaction and history of anesthetic complications  Airway Mallampati: II  TM Distance: >3 FB Neck ROM: Full    Dental no notable dental hx. (+) Poor Dentition, Dental Advisory Given, Missing, Edentulous Upper   Pulmonary neg pulmonary ROS, former smoker,     + wheezing      Cardiovascular hypertension, Pt. on home beta blockers and Pt. on medications + dysrhythmias  Rhythm:Regular Rate:Tachycardia  Echo 11/25/2020 1. Left ventricular ejection fraction, by estimation, is 60 to 65%. The left ventricle has normal function. The left ventricle has no regional wall motion abnormalities. There is mild concentric left ventricular hypertrophy. Left ventricular diastolic parameters were normal.  2. Right ventricular systolic function is mildly reduced. The right ventricular size is moderately enlarged. Tricuspid regurgitation signal is inadequate for assessing PA pressure.  3. The mitral valve is grossly normal. Trivial mitral valve  regurgitation. No evidence of mitral stenosis.  4. The aortic valve is tricuspid. Aortic valve regurgitation is not visualized. No aortic stenosis is present.  5. The inferior vena cava is normal in size with <50% respiratory variability, suggesting right atrial pressure of 8 mmHg.    Neuro/Psych PSYCHIATRIC DISORDERS Anxiety  Neuromuscular disease    GI/Hepatic Neg liver ROS, GERD  ,  Endo/Other  diabetes  Renal/GU negative Renal ROS     Musculoskeletal  (+) Arthritis ,   Abdominal   Peds  Hematology negative hematology ROS (+)   Anesthesia Other Findings   Reproductive/Obstetrics                              Anesthesia Physical Anesthesia Plan  ASA: 3 and emergent  Anesthesia Plan: General   Post-op Pain Management:    Induction: Intravenous  PONV Risk Score and Plan: 3 and Ondansetron, Dexamethasone, Midazolam and Treatment may vary due to age or medical condition  Airway Management Planned: Oral ETT  Additional Equipment: Arterial line and CVP  Intra-op Plan:   Post-operative Plan: Possible Post-op intubation/ventilation  Informed Consent: I have reviewed the patients History and Physical, chart, labs and discussed the procedure including the risks, benefits and alternatives for the proposed anesthesia with the patient or authorized representative who has indicated his/her understanding and acceptance.     Dental advisory given  Plan Discussed with: CRNA  Anesthesia Plan Comments:        Anesthesia Quick Evaluation

## 2020-11-27 NOTE — Progress Notes (Signed)
[facility]  PROGRESS NOTE  Danny Hampton HBZ:[id number] DOB: [birth year] DOA: 11/23/2020 PCP: [facility]   Brief HPI:   65 year old male with medical history of acid reflux, dysrhythmia, essential hypertension, hyperlipidemia, myotonia congenita, diabetes mellitus type 2 presented to ED with complaints of abdominal pain.  Patient has history of spinal stenosis, underwent spinal surgery with thoracic/sacral fusion on 10/11/2020.  Patient went home after spine surgery and developed abdominal distention and pain.  Patient was seen at [facility] and was sent home with a container of GoLytely.  Patient drank GoLytely however felt there was something wrong.  Developed multiple loose bowel movements.  He was seen by general surgery, NG tube was inserted there was concern for postop ileus/Ogilvie's syndrome.  He was noted to have purulent drainage from thoracic spine surgical site.  Blood cultures were positive for MSSA 4/4 bottles.  He was transferred to [facility] for further evaluation by neurosurgery Dr. Saintclair Halsted.    MRI of lumbar and thoracic spine obtained showed extension of lumbar fusion superiorly to T10.  Nonspecific postoperative edema and fluid collection in the posterior paraspinal soft tissues centered at T12 which could represent a seroma but abscess not excluded.  Mild edema within the discs at T10-11, T12-L1, early discitis is difficult to exclude.  Patient went into A. fib with RVR last night and was started on Cardizem infusion.  Also had intermittent episodes of SVT, required adenosine.  Due to persistent tachycardia with heart rate in 160s cardiology was consulted.   Subjective   This morning patient went into A. fib with RVR, heart rate not controlled despite being on Cardizem 15 mg/h.  Also received 5 mg IV metoprolol without significant change in heart rate.  He received adenosine last night for SVT.  Still complains of back pain, has significant  abdominal distention, NG tube is in place.   Assessment/Plan:     New onset atrial fibrillation with RVR -Likely from underlying MSSA bacteremia, postop wound infection with paraspinal abscess -Continue Cardizem 15 mg/h -We will avoid giving anticoagulation considering patient might undergo surgery -We will consult cardiology for further recommendations  Severe sepsis -Patient has developed severe sepsis due to paraspinal abscess, MSSA bacteremia -Blood pressure is stable -Lactic acid 1.9 -Continue IV antibiotics  MSSA bacteremia/paraspinal abscess -Patient has MSSA bacteremia 4/4 bottles -Continue IV cefazolin -Likely source from paraspinal abscess around T12 seen on MRI lumbar and thoracic spine -ID following; TTE was unremarkable -Follow-up ID recommendations  Postoperative ileus/Ogilvie syndrome -NG tube inserted, was seen by general surgery at [facility] -We will consult general surgery and GI -We will keep him n.p.o. for now  Acute hypoxemic respiratory failure -Patient has bilateral wheezing, likely developed fluid overload -We will give 1 dose of Lasix 40 mg IV -BNP 249.2, chest x-ray showed no acute findings  Postop wound infection -Patient underwent thoracic spine surgery on 10/31/2020 -MRI thoracic/lumbar spine shows paraspinal abscess at T12 -Neurosurgery has been consulted and will see patient today  Diabetes mellitus type 2 -Continue sliding scale insulin with NovoLog          Data Reviewed:   CBG:  Recent Labs  Lab 11/27/20 0357 11/27/20 0640 11/27/20 0732 11/27/20 1245 11/27/20 1640  GLUCAP 139* 142* 139* 181* 159*    SpO2: (!) 87 % O2 Flow Rate (L/min): 6 L/min FiO2 (%): 28 %    Vitals:   11/27/20 1339 11/27/20 1400 11/27/20 1645 11/27/20 1700  BP: (!) 131/99 (!) 135/99  105/80 115/70  Pulse: (!) 167 (!) 168 (!) 42   Resp: 20 18 (!) 21   Temp:   98 F (36.7 C)   TempSrc:      SpO2: 90% 91% (!) 87%   Weight:       Height:         Intake/Output Summary (Last 24 hours) at 11/27/2020 1739 Last data filed at 11/27/2020 1636 Gross per 24 hour  Intake 800 ml  Output 1390 ml  Net -590 ml    09/18 1901 - 09/20 0700 In: 1251.2 [I.V.:851.2] Out: 1525 [Urine:825]  Filed Weights   11/25/20 0421 11/26/20 0411 11/27/20 0454  Weight: 85.5 kg 95.7 kg 94.6 kg    Data Reviewed: Basic Metabolic Panel: Recent Labs  Lab 11/23/20 2003 11/24/20 0546 11/25/20 0306 11/26/20 0454 11/27/20 0348 11/27/20 0851  NA 130* 130* 124* 124* 130*  --   K 3.8 3.7 3.2* 4.5 4.4  --   CL 88* 89* 90* 91* 95*  --   CO2 27 32 25 22 21*  --   GLUCOSE 184* 175* 154* 138* 128*  --   BUN 19 16 14  25* 23  --   CREATININE 0.90 0.78 0.79 1.36* 1.05  --   CALCIUM 8.9 8.6* 7.4* 7.6* 8.6*  --   MG  --  1.5* 1.9  --   --  1.9  PHOS  --   --   --   --   --  2.4*   Liver Function Tests: Recent Labs  Lab 11/23/20 2003 11/24/20 0546 11/25/20 0306 11/26/20 0454 11/27/20 0348  AST 33 33 41 330* 393*  ALT 20 17 9 13  77*  ALKPHOS 100 90 60 55 50  BILITOT 0.6 0.6 0.6 0.8 1.0  PROT 7.6 7.0 5.6* 6.4* 5.9*  ALBUMIN 4.1 3.6 2.7* 2.8* 2.5*   Recent Labs  Lab 11/27/20 0851  LIPASE 48   No results for input(s): AMMONIA in the last 168 hours. CBC: Recent Labs  Lab 11/23/20 2003 11/24/20 0546 11/25/20 0306 11/26/20 0454 11/27/20 0348  WBC 14.9* 11.9* 6.8 6.4 5.8  NEUTROABS 13.3* 10.1* 6.1 5.3 4.6  HGB 12.4* 12.4* 9.6* 9.8* 10.0*  HCT 36.3* 36.9* 28.1* 29.5* 28.7*  MCV 93.8 96.1 92.7 95.5 92.0  PLT 196 143* 104* 83* 77*   Cardiac Enzymes: No results for input(s): CKTOTAL, CKMB, CKMBINDEX, TROPONINI in the last 168 hours. BNP (last 3 results) Recent Labs    11/27/20 0851  BNP 249.2*    ProBNP (last 3 results) No results for input(s): PROBNP in the last 8760 hours.  CBG: Recent Labs  Lab 11/27/20 0357 11/27/20 0640 11/27/20 0732 11/27/20 1245 11/27/20 1640  GLUCAP 139* 142* 139* 181* 159*    Recent  Results (from the past 240 hour(s))  Resp Panel by RT-PCR (Flu A&B, Covid) Nasopharyngeal Swab     Status: None   Collection Time: 11/24/20 12:40 AM   Specimen: Nasopharyngeal Swab; Nasopharyngeal(NP) swabs in vial transport medium  Result Value Ref Range Status   SARS Coronavirus 2 by RT PCR NEGATIVE NEGATIVE Final    Comment: (NOTE) SARS-CoV-2 target nucleic acids are NOT DETECTED.  The SARS-CoV-2 RNA is generally detectable in upper respiratory specimens during the acute phase of infection. The lowest concentration of SARS-CoV-2 viral copies this assay can detect is 138 copies/mL. A negative result does not preclude SARS-Cov-2 infection and should not be used as the sole basis for treatment or other patient management decisions. A negative result  may occur with  improper specimen collection/handling, submission of specimen other than nasopharyngeal swab, presence of viral mutation(s) within the areas targeted by this assay, and inadequate number of viral copies(<138 copies/mL). A negative result must be combined with clinical observations, patient history, and epidemiological information. The expected result is Negative.  Fact Sheet for Patients:  EntrepreneurPulse.com.au  Fact Sheet for Healthcare Providers:  IncredibleEmployment.be  This test is no t yet approved or cleared by the [place] FDA and  has been authorized for detection and/or diagnosis of SARS-CoV-2 by FDA under an Emergency Use Authorization (EUA). This EUA will remain  in effect (meaning this test can be used) for the duration of the COVID-19 declaration under Section 564(b)(1) of the Act, 21 U.S.C.section 360bbb-3(b)(1), unless the authorization is terminated  or revoked sooner.       Influenza A by PCR NEGATIVE NEGATIVE Final   Influenza B by PCR NEGATIVE NEGATIVE Final    Comment: (NOTE) The Xpert Xpress SARS-CoV-2/FLU/RSV plus assay is intended as an aid in the  diagnosis of influenza from Nasopharyngeal swab specimens and should not be used as a sole basis for treatment. Nasal washings and aspirates are unacceptable for Xpert Xpress SARS-CoV-2/FLU/RSV testing.  Fact Sheet for Patients: EntrepreneurPulse.com.au  Fact Sheet for Healthcare Providers: IncredibleEmployment.be  This test is not yet approved or cleared by the [place] FDA and has been authorized for detection and/or diagnosis of SARS-CoV-2 by FDA under an Emergency Use Authorization (EUA). This EUA will remain in effect (meaning this test can be used) for the duration of the COVID-19 declaration under Section 564(b)(1) of the Act, 21 U.S.C. section 360bbb-3(b)(1), unless the authorization is terminated or revoked.  Performed at [facility], [address], [place], [place] [address]   Culture, blood (routine x 2)     Status: Abnormal   Collection Time: 11/24/20  8:15 AM   Specimen: BLOOD LEFT HAND  Result Value Ref Range Status   Specimen Description   Final    BLOOD LEFT HAND BOTTLES DRAWN AEROBIC AND ANAEROBIC Performed at [facility], [address] [place], [place] [address]    Special Requests   Final    Blood Culture adequate volume Performed at [facility], [address] [place], [place] [address]    Culture  Setup Time   Final    GRAM POSITIVE COCCI IN BOTH AEROBIC AND ANAEROBIC BOTTLES CRITICAL RESULT CALLED TO, READ BACK BY AND VERIFIED WITH: PHARM D L.POOLE ON [id number] AT 0818 BY E.PARRISH Performed at [facility], [place] [address] [place], [place] [address]    Culture STAPHYLOCOCCUS AUREUS (A)  Final   Report Status 11/27/2020 FINAL  Final   Organism ID, Bacteria STAPHYLOCOCCUS AUREUS  Final      Susceptibility   Staphylococcus aureus - MIC*    CIPROFLOXACIN <=0.5 SENSITIVE Sensitive     ERYTHROMYCIN <=0.25 SENSITIVE Sensitive     GENTAMICIN <=0.5 SENSITIVE Sensitive     OXACILLIN <=0.25 SENSITIVE Sensitive      TETRACYCLINE <=1 SENSITIVE Sensitive     VANCOMYCIN 1 SENSITIVE Sensitive     TRIMETH/SULFA <=10 SENSITIVE Sensitive     CLINDAMYCIN <=0.25 SENSITIVE Sensitive     RIFAMPIN <=0.5 SENSITIVE Sensitive     Inducible Clindamycin NEGATIVE Sensitive     * STAPHYLOCOCCUS AUREUS  Blood Culture ID Panel (Reflexed)     Status: Abnormal   Collection Time: 11/24/20  8:15 AM  Result Value Ref Range Status   Enterococcus faecalis NOT DETECTED  NOT DETECTED Final   Enterococcus Faecium NOT DETECTED NOT DETECTED Final   Listeria monocytogenes NOT DETECTED NOT DETECTED Final   Staphylococcus species DETECTED (A) NOT DETECTED Final    Comment: CRITICAL RESULT CALLED TO, READ BACK BY AND VERIFIED WITH: PHARM D L.POOLE ON [id number] AT 0818 BY E.PARRISH    Staphylococcus aureus (BCID) DETECTED (A) NOT DETECTED Final    Comment: CRITICAL RESULT CALLED TO, READ BACK BY AND VERIFIED WITH: PHARM D L.POOLE ON [id number] AT 0818 BY E.PARRISH    Staphylococcus epidermidis NOT DETECTED NOT DETECTED Final   Staphylococcus lugdunensis NOT DETECTED NOT DETECTED Final   Streptococcus species NOT DETECTED NOT DETECTED Final   Streptococcus agalactiae NOT DETECTED NOT DETECTED Final   Streptococcus pneumoniae NOT DETECTED NOT DETECTED Final   Streptococcus pyogenes NOT DETECTED NOT DETECTED Final   A.calcoaceticus-baumannii NOT DETECTED NOT DETECTED Final   Bacteroides fragilis NOT DETECTED NOT DETECTED Final   Enterobacterales NOT DETECTED NOT DETECTED Final   Enterobacter cloacae complex NOT DETECTED NOT DETECTED Final   Escherichia coli NOT DETECTED NOT DETECTED Final   Klebsiella aerogenes NOT DETECTED NOT DETECTED Final   Klebsiella oxytoca NOT DETECTED NOT DETECTED Final   Klebsiella pneumoniae NOT DETECTED NOT DETECTED Final   Proteus species NOT DETECTED NOT DETECTED Final   Salmonella species NOT DETECTED NOT DETECTED Final   Serratia marcescens NOT DETECTED NOT DETECTED Final   Haemophilus influenzae  NOT DETECTED NOT DETECTED Final   Neisseria meningitidis NOT DETECTED NOT DETECTED Final   Pseudomonas aeruginosa NOT DETECTED NOT DETECTED Final   Stenotrophomonas maltophilia NOT DETECTED NOT DETECTED Final   Candida albicans NOT DETECTED NOT DETECTED Final   Candida auris NOT DETECTED NOT DETECTED Final   Candida glabrata NOT DETECTED NOT DETECTED Final   Candida krusei NOT DETECTED NOT DETECTED Final   Candida parapsilosis NOT DETECTED NOT DETECTED Final   Candida tropicalis NOT DETECTED NOT DETECTED Final   Cryptococcus neoformans/gattii NOT DETECTED NOT DETECTED Final   Meth resistant mecA/C and MREJ NOT DETECTED NOT DETECTED Final    Comment: Performed at [facility], [address], [place], [place] [address]  Culture, blood (routine x 2)     Status: Abnormal   Collection Time: 11/24/20  8:16 AM   Specimen: BLOOD RIGHT HAND  Result Value Ref Range Status   Specimen Description   Final    BLOOD RIGHT HAND BOTTLES DRAWN AEROBIC AND ANAEROBIC Performed at [facility], [address] [place], [place] [address]    Special Requests   Final    Blood Culture adequate volume Performed at [facility], [address] [place], [place] [address]    Culture  Setup Time   Final    GRAM POSITIVE COCCI IN BOTH AEROBIC AND ANAEROBIC BOTTLES CRITICAL VALUE NOTED.  VALUE IS CONSISTENT WITH PREVIOUSLY REPORTED AND CALLED VALUE.    Culture (A)  Final    STAPHYLOCOCCUS AUREUS SUSCEPTIBILITIES PERFORMED ON PREVIOUS CULTURE WITHIN THE LAST 5 DAYS. Performed at [facility], [place] [address], [place], [place] [address]    Report Status 11/27/2020 FINAL  Final  MRSA Next Gen by PCR, Nasal     Status: None   Collection Time: 11/24/20  3:30 PM   Specimen: Nasal Mucosa; Nasal Swab  Result Value Ref Range Status   MRSA by PCR Next Gen NOT DETECTED NOT DETECTED Final    Comment: (NOTE) The GeneXpert MRSA Assay (FDA approved for NASAL specimens only), is one component of  a  comprehensive MRSA colonization surveillance program. It is not intended to diagnose MRSA infection nor to guide or monitor treatment for MRSA infections. Test performance is not FDA approved in patients less than 22 years old. Performed at [facility], [address] [place], [place] [address]   Aerobic Culture w Gram Stain (superficial specimen)     Status: None (Preliminary result)   Collection Time: 11/25/20 11:00 AM   Specimen: Back; Wound  Result Value Ref Range Status   Specimen Description   Final    BACK Performed at [facility], [address] [place], [place] [address]    Special Requests   Final    Normal Performed at [facility], [address] [place], [place] [address]    Gram Stain NO WBC SEEN FEW GRAM POSITIVE COCCI   Final   Culture   Final    ABUNDANT STAPHYLOCOCCUS AUREUS SUSCEPTIBILITIES TO FOLLOW Performed at [facility], [place] [address] [place], [place] [address]    Report Status PENDING  Incomplete  Culture, blood (routine x 2)     Status: None (Preliminary result)   Collection Time: 11/25/20  3:42 PM   Specimen: BLOOD LEFT FOREARM  Result Value Ref Range Status   Specimen Description   Final    BLOOD LEFT FOREARM BOTTLES DRAWN AEROBIC AND ANAEROBIC   Special Requests Blood Culture adequate volume  Final   Culture   Final    NO GROWTH 2 DAYS Performed at [facility], [address] [place], [place] [address]    Report Status PENDING  Incomplete  Culture, blood (routine x 2)     Status: None (Preliminary result)   Collection Time: 11/25/20  3:55 PM   Specimen: Right Antecubital; Blood  Result Value Ref Range Status   Specimen Description   Final    RIGHT ANTECUBITAL BOTTLES DRAWN AEROBIC AND ANAEROBIC   Special Requests Blood Culture adequate volume  Final   Culture   Final    NO GROWTH 2 DAYS Performed at [facility], [address], [place], [place] [address]    Report Status PENDING  Incomplete  Surgical PCR screen     Status:  Abnormal   Collection Time: 11/27/20  1:58 PM   Specimen: Nasal Mucosa; Nasal Swab  Result Value Ref Range Status   MRSA, PCR NEGATIVE NEGATIVE Final   Staphylococcus aureus POSITIVE (A) NEGATIVE Final    Comment: (NOTE) The Xpert SA Assay (FDA approved for NASAL specimens in patients 49 years of age and older), is one component of a comprehensive surveillance program. It is not intended to diagnose infection nor to guide or monitor treatment. Performed at [facility], [place] [address], [place], [place] [address]      Radiology Reports  DG Abd 1 View  Result Date: 11/26/2020 CLINICAL DATA:  Constipation with blood in stool EXAM: ABDOMEN - 1 VIEW COMPARISON:  11/25/2020 FINDINGS: Rectal catheter is in place. Nasogastric tube is in place. There is gas within small and large bowel which could be due to ileus or pseudo-obstruction of the elderly. Extensive previous spinal fusion with multiple spinal augmentations as seen previously. IMPRESSION: Rectal tube now in place. Intestinal gas consistent with ileus. No other significant change since yesterday. Electronically Signed   By: Nelson Chimes M.D.   On: 11/26/2020 08:51   DG Abd 1 View  Result Date: 11/25/2020 CLINICAL DATA:  NG tube placement. EXAM: ABDOMEN - 1 VIEW COMPARISON:  Abdominal x-ray 11/25/2020. FINDINGS:  Nasogastric tube tip is in the proximal stomach, just beyond the gastroesophageal junction. Again seen is diffuse gaseous distension of the colon. This is slightly decreased when compared to the prior study. Air seen within central nondilated small bowel. The visualized lung bases are clear. Thoracolumbar fusion hardware is again seen. IMPRESSION: 1. Nasogastric tube tip is in the proximal stomach just beyond the gastroesophageal junction. Recommend advancing tube. 2. Gaseous distension of the entire colon is again seen. Degree of distention has slightly improved. Continued follow-up recommended. Electronically Signed   By: Ronney Asters M.D.   On: 11/25/2020 21:09   MR CERVICAL SPINE WO CONTRAST  Result Date: 11/26/2020 CLINICAL DATA:  Spinal fusion, thoracic, follow up Epidural abscess; Spinal fusion, lumbosacral, follow up EXAM: MRI CERVICAL, THORACIC AND LUMBAR SPINE WITHOUT AND WITH CONTRAST TECHNIQUE: Multiplanar and multiecho pulse sequences of the cervical spine, to include the craniocervical junction and cervicothoracic junction, and thoracic and lumbar spine, were obtained without and with intravenous contrast. CONTRAST:  38mL GADAVIST GADOBUTROL 1 MMOL/ML IV SOLN COMPARISON:  CT myelogram September 03, 2020. FINDINGS: MRI CERVICAL SPINE FINDINGS Significantly motion limited study.  Within this limitation: Alignment: Similar alignment with reversal of the normal cervical lordosis and grade 1 anterolisthesis of C3 on C4, C4 on C5, and C6 on C7. Vertebrae: Motion limited evaluation. Degenerative/discogenic endplate signal changes at multiple levels. Otherwise, no obvious marrow edema to suggest acute fracture or discitis/osteomyelitis. Cord: Motion limited evaluation without definite cord signal abnormality. Posterior Fossa, vertebral arteries, paraspinal tissues: Visualized vertebral artery flow voids are maintained. No acute findings in the visualized posterior fossa. There is nonspecific ill-defined edema in the posterior paraspinal soft tissues. Disc levels: C2-C3: No significant disc protrusion, foraminal stenosis, or canal stenosis. C3-C4: Posterior disc osteophyte complex with left greater than right facet and uncovertebral hypertrophy. Resulting moderate to severe left foraminal stenosis and mild canal stenosis. C4-C5: Posterior disc osteophyte complex with right greater than left facet and uncovertebral hypertrophy. Resulting severe right foraminal stenosis and moderate canal stenosis. Flattening of the ventral cord. C5-C6: Posterior disc osteophyte complex with mild bilateral facet and uncovertebral hypertrophy. Mild canal  stenosis without significant foraminal stenosis. C6-C7: Posterior disc osteophyte complex with central inferiorly dissecting disc protrusion versus calcified PLL (as seen on prior myelogram). Resulting moderate canal stenosis. Bilateral facet and uncovertebral hypertrophy with mild to moderate bilateral foraminal stenosis. C7-T1: Posterior disc osteophyte complex with left greater than right facet and uncovertebral hypertrophy. Central disc protrusion with inferior extension. Resulting mild-to-moderate canal stenosis and mild bilateral foraminal stenosis. MRI THORACIC SPINE FINDINGS Alignment:  Normal. Vertebrae: Interval posterior fusion spanning from T10 inferiorly into the lumbar spine. Nonspecific mild edema within the disc spaces at T10-T11, T11-T12, and T12-L1. Cord:  Normal cord signal. Paraspinal and other soft tissues: Postoperative fluid collection in the posterior paraspinal soft tissues in the lower thoracic spine and lumbar spine (detailed further below). Disc levels: Mild Mild disc bulging at multiple upper to mid thoracic levels. At T7-T8 right paracentral disc protrusion flattens the ventral cord with mild canal stenosis, similar to prior. At T8-T9, disc bulging and endplate spurring results in mild canal stenosis, similar. At T9-T10, evaluation is limited by metallic artifact; however, posterior disc bulging appears to result in mild to moderate canal stenosis, possibly progressed from the prior but poorly characterized. Interval extension of the patient's lumbar fusion superiorly to T10. At T10-T11, the canal appears to be mild to moderately moderately narrowed, similar versus slightly progressed. At T11-T12 the canal appears  to be patent. MRI LUMBAR SPINE FINDINGS Motion limited evaluation.  Within this limitation: Segmentation:  Standard. Alignment:  Normal. Vertebrae:  Interval posterior fusion spanning from T10 to S1. Conus medullaris and cauda equina: Conus extends to the L1-L2 level. Conus  appears normal. Paraspinal and other soft tissues: Postoperative edema in the paraspinal soft tissues with fluid collection in the paraspinal soft tissues measuring up to 8 x 3 x 63 mm, centered at T12-L1. There is also nonspecific fluid surrounding the posterior elements bilaterally at L2-L3. Disc levels: T12-L1: Posterior fusion. Mild canal stenosis. Limited evaluation of foramina due to metallic artifact. L1-L2: Posterior fusion and decompression. Improved canal stenosis with patent canal. Limited evaluation of the foramina due to metallic artifact with visualized portions appearing patent. L2-L3: Prior posterior decompression and fusion. Osteophytic ridging. Facet hypertrophy. Mild canal stenosis, similar. Limited evaluation of foramina with likely mild bilateral foraminal stenosis. L3-L4: Prior posterior fusion and decompression. Patent canal and foramina. L4-L5: Prior posterior fusion and decompression. Patent canal with probably mild bilateral foraminal stenosis. L5-S1: Prior posterior fusion and decompression. Patent canal with mild left foraminal stenosis. IMPRESSION: MRI cervical spine: 1. Similar moderate canal stenosis at C4-C5 and C6-C7 2. Similar severe foraminal stenosis on the right at C4-C5 and moderate to severe foraminal stenosis on the left at C3-C4. 3. 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 4. Nonspecific ill-defined edema in the posterior paraspinal soft tissues, which could represent strain or cellulitis. MRI thoracic and lumbar spine: 1. Status post extension of lumbar fusion superiorly to T10. Nonspecific postoperative edema and fluid collection in the posterior paraspinal soft tissues centered at T12, which could represent a seroma but abscess is not excluded given reported purulent drainage. There also is nonspecific fluid surrounding the posterior elements at L2-L3. 2. Mild edema within  the discs at T10-T11 and T12-L1, which could be degenerative or stress related but early discitis is difficult to exclude given the clinical history. No clearly infectious findings within the canal, although evaluation is limited by metallic artifact and motion. If there is clinical concern for infection, consider correlation with inflammatory markers and close interval follow-up MRI with contrast as clinically indicated. 3. Improved canal stenosis and foraminal stenosis at L1-L2 status post decompression and fusion. 4. Mild to moderate canal stenosis at T9-T10 and T10-T11 appears similar versus slightly progressed. Similar mild canal stenosis at T7-T8. Electronically Signed   By: Margaretha Sheffield M.D.   On: 11/26/2020 16:39   MR THORACIC SPINE W WO CONTRAST  Result Date: 11/26/2020 CLINICAL DATA:  Spinal fusion, thoracic, follow up Epidural abscess; Spinal fusion, lumbosacral, follow up EXAM: MRI CERVICAL, THORACIC AND LUMBAR SPINE WITHOUT AND WITH CONTRAST TECHNIQUE: Multiplanar and multiecho pulse sequences of the cervical spine, to include the craniocervical junction and cervicothoracic junction, and thoracic and lumbar spine, were obtained without and with intravenous contrast. CONTRAST:  35mL GADAVIST GADOBUTROL 1 MMOL/ML IV SOLN COMPARISON:  CT myelogram September 03, 2020. FINDINGS: MRI CERVICAL SPINE FINDINGS Significantly motion limited study.  Within this limitation: Alignment: Similar alignment with reversal of the normal cervical lordosis and grade 1 anterolisthesis of C3 on C4, C4 on C5, and C6 on C7. Vertebrae: Motion limited evaluation. Degenerative/discogenic endplate signal changes at multiple levels. Otherwise, no obvious marrow edema to suggest acute fracture or discitis/osteomyelitis. Cord: Motion limited evaluation without definite cord signal abnormality. Posterior Fossa, vertebral arteries, paraspinal tissues: Visualized vertebral artery flow voids are maintained. No acute  findings in the  visualized posterior fossa. There is nonspecific ill-defined edema in the posterior paraspinal soft tissues. Disc levels: C2-C3: No significant disc protrusion, foraminal stenosis, or canal stenosis. C3-C4: Posterior disc osteophyte complex with left greater than right facet and uncovertebral hypertrophy. Resulting moderate to severe left foraminal stenosis and mild canal stenosis. C4-C5: Posterior disc osteophyte complex with right greater than left facet and uncovertebral hypertrophy. Resulting severe right foraminal stenosis and moderate canal stenosis. Flattening of the ventral cord. C5-C6: Posterior disc osteophyte complex with mild bilateral facet and uncovertebral hypertrophy. Mild canal stenosis without significant foraminal stenosis. C6-C7: Posterior disc osteophyte complex with central inferiorly dissecting disc protrusion versus calcified PLL (as seen on prior myelogram). Resulting moderate canal stenosis. Bilateral facet and uncovertebral hypertrophy with mild to moderate bilateral foraminal stenosis. C7-T1: Posterior disc osteophyte complex with left greater than right facet and uncovertebral hypertrophy. Central disc protrusion with inferior extension. Resulting mild-to-moderate canal stenosis and mild bilateral foraminal stenosis. MRI THORACIC SPINE FINDINGS Alignment:  Normal. Vertebrae: Interval posterior fusion spanning from T10 inferiorly into the lumbar spine. Nonspecific mild edema within the disc spaces at T10-T11, T11-T12, and T12-L1. Cord:  Normal cord signal. Paraspinal and other soft tissues: Postoperative fluid collection in the posterior paraspinal soft tissues in the lower thoracic spine and lumbar spine (detailed further below). Disc levels: Mild Mild disc bulging at multiple upper to mid thoracic levels. At T7-T8 right paracentral disc protrusion flattens the ventral cord with mild canal stenosis, similar to prior. At T8-T9, disc bulging and endplate spurring results in mild canal  stenosis, similar. At T9-T10, evaluation is limited by metallic artifact; however, posterior disc bulging appears to result in mild to moderate canal stenosis, possibly progressed from the prior but poorly characterized. Interval extension of the patient's lumbar fusion superiorly to T10. At T10-T11, the canal appears to be mild to moderately moderately narrowed, similar versus slightly progressed. At T11-T12 the canal appears to be patent. MRI LUMBAR SPINE FINDINGS Motion limited evaluation.  Within this limitation: Segmentation:  Standard. Alignment:  Normal. Vertebrae:  Interval posterior fusion spanning from T10 to S1. Conus medullaris and cauda equina: Conus extends to the L1-L2 level. Conus appears normal. Paraspinal and other soft tissues: Postoperative edema in the paraspinal soft tissues with fluid collection in the paraspinal soft tissues measuring up to 8 x 3 x 63 mm, centered at T12-L1. There is also nonspecific fluid surrounding the posterior elements bilaterally at L2-L3. Disc levels: T12-L1: Posterior fusion. Mild canal stenosis. Limited evaluation of foramina due to metallic artifact. L1-L2: Posterior fusion and decompression. Improved canal stenosis with patent canal. Limited evaluation of the foramina due to metallic artifact with visualized portions appearing patent. L2-L3: Prior posterior decompression and fusion. Osteophytic ridging. Facet hypertrophy. Mild canal stenosis, similar. Limited evaluation of foramina with likely mild bilateral foraminal stenosis. L3-L4: Prior posterior fusion and decompression. Patent canal and foramina. L4-L5: Prior posterior fusion and decompression. Patent canal with probably mild bilateral foraminal stenosis. L5-S1: Prior posterior fusion and decompression. Patent canal with mild left foraminal stenosis. IMPRESSION: MRI cervical spine: 1. Similar moderate canal stenosis at C4-C5 and C6-C7 2. Similar severe foraminal stenosis on the right at C4-C5 and moderate to  severe foraminal stenosis on the left at C3-C4. 3. 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 4. Nonspecific ill-defined edema in the posterior paraspinal soft tissues, which could represent strain or cellulitis. MRI thoracic and lumbar spine: 1. Status post  extension of lumbar fusion superiorly to T10. Nonspecific postoperative edema and fluid collection in the posterior paraspinal soft tissues centered at T12, which could represent a seroma but abscess is not excluded given reported purulent drainage. There also is nonspecific fluid surrounding the posterior elements at L2-L3. 2. Mild edema within the discs at T10-T11 and T12-L1, which could be degenerative or stress related but early discitis is difficult to exclude given the clinical history. No clearly infectious findings within the canal, although evaluation is limited by metallic artifact and motion. If there is clinical concern for infection, consider correlation with inflammatory markers and close interval follow-up MRI with contrast as clinically indicated. 3. Improved canal stenosis and foraminal stenosis at L1-L2 status post decompression and fusion. 4. Mild to moderate canal stenosis at T9-T10 and T10-T11 appears similar versus slightly progressed. Similar mild canal stenosis at T7-T8. Electronically Signed   By: Margaretha Sheffield M.D.   On: 11/26/2020 16:39   MR Lumbar Spine W Wo Contrast  Result Date: 11/26/2020 CLINICAL DATA:  Spinal fusion, thoracic, follow up Epidural abscess; Spinal fusion, lumbosacral, follow up EXAM: MRI CERVICAL, THORACIC AND LUMBAR SPINE WITHOUT AND WITH CONTRAST TECHNIQUE: Multiplanar and multiecho pulse sequences of the cervical spine, to include the craniocervical junction and cervicothoracic junction, and thoracic and lumbar spine, were obtained without and with intravenous contrast. CONTRAST:  49mL GADAVIST GADOBUTROL 1  MMOL/ML IV SOLN COMPARISON:  CT myelogram September 03, 2020. FINDINGS: MRI CERVICAL SPINE FINDINGS Significantly motion limited study.  Within this limitation: Alignment: Similar alignment with reversal of the normal cervical lordosis and grade 1 anterolisthesis of C3 on C4, C4 on C5, and C6 on C7. Vertebrae: Motion limited evaluation. Degenerative/discogenic endplate signal changes at multiple levels. Otherwise, no obvious marrow edema to suggest acute fracture or discitis/osteomyelitis. Cord: Motion limited evaluation without definite cord signal abnormality. Posterior Fossa, vertebral arteries, paraspinal tissues: Visualized vertebral artery flow voids are maintained. No acute findings in the visualized posterior fossa. There is nonspecific ill-defined edema in the posterior paraspinal soft tissues. Disc levels: C2-C3: No significant disc protrusion, foraminal stenosis, or canal stenosis. C3-C4: Posterior disc osteophyte complex with left greater than right facet and uncovertebral hypertrophy. Resulting moderate to severe left foraminal stenosis and mild canal stenosis. C4-C5: Posterior disc osteophyte complex with right greater than left facet and uncovertebral hypertrophy. Resulting severe right foraminal stenosis and moderate canal stenosis. Flattening of the ventral cord. C5-C6: Posterior disc osteophyte complex with mild bilateral facet and uncovertebral hypertrophy. Mild canal stenosis without significant foraminal stenosis. C6-C7: Posterior disc osteophyte complex with central inferiorly dissecting disc protrusion versus calcified PLL (as seen on prior myelogram). Resulting moderate canal stenosis. Bilateral facet and uncovertebral hypertrophy with mild to moderate bilateral foraminal stenosis. C7-T1: Posterior disc osteophyte complex with left greater than right facet and uncovertebral hypertrophy. Central disc protrusion with inferior extension. Resulting mild-to-moderate canal stenosis and mild bilateral  foraminal stenosis. MRI THORACIC SPINE FINDINGS Alignment:  Normal. Vertebrae: Interval posterior fusion spanning from T10 inferiorly into the lumbar spine. Nonspecific mild edema within the disc spaces at T10-T11, T11-T12, and T12-L1. Cord:  Normal cord signal. Paraspinal and other soft tissues: Postoperative fluid collection in the posterior paraspinal soft tissues in the lower thoracic spine and lumbar spine (detailed further below). Disc levels: Mild Mild disc bulging at multiple upper to mid thoracic levels. At T7-T8 right paracentral disc protrusion flattens the ventral cord with mild canal stenosis, similar to prior. At T8-T9, disc bulging and endplate spurring results in mild canal stenosis,  similar. At T9-T10, evaluation is limited by metallic artifact; however, posterior disc bulging appears to result in mild to moderate canal stenosis, possibly progressed from the prior but poorly characterized. Interval extension of the patient's lumbar fusion superiorly to T10. At T10-T11, the canal appears to be mild to moderately moderately narrowed, similar versus slightly progressed. At T11-T12 the canal appears to be patent. MRI LUMBAR SPINE FINDINGS Motion limited evaluation.  Within this limitation: Segmentation:  Standard. Alignment:  Normal. Vertebrae:  Interval posterior fusion spanning from T10 to S1. Conus medullaris and cauda equina: Conus extends to the L1-L2 level. Conus appears normal. Paraspinal and other soft tissues: Postoperative edema in the paraspinal soft tissues with fluid collection in the paraspinal soft tissues measuring up to 8 x 3 x 63 mm, centered at T12-L1. There is also nonspecific fluid surrounding the posterior elements bilaterally at L2-L3. Disc levels: T12-L1: Posterior fusion. Mild canal stenosis. Limited evaluation of foramina due to metallic artifact. L1-L2: Posterior fusion and decompression. Improved canal stenosis with patent canal. Limited evaluation of the foramina due to  metallic artifact with visualized portions appearing patent. L2-L3: Prior posterior decompression and fusion. Osteophytic ridging. Facet hypertrophy. Mild canal stenosis, similar. Limited evaluation of foramina with likely mild bilateral foraminal stenosis. L3-L4: Prior posterior fusion and decompression. Patent canal and foramina. L4-L5: Prior posterior fusion and decompression. Patent canal with probably mild bilateral foraminal stenosis. L5-S1: Prior posterior fusion and decompression. Patent canal with mild left foraminal stenosis. IMPRESSION: MRI cervical spine: 1. Similar moderate canal stenosis at C4-C5 and C6-C7 2. Similar severe foraminal stenosis on the right at C4-C5 and moderate to severe foraminal stenosis on the left at C3-C4. 3. 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 4. Nonspecific ill-defined edema in the posterior paraspinal soft tissues, which could represent strain or cellulitis. MRI thoracic and lumbar spine: 1. Status post extension of lumbar fusion superiorly to T10. Nonspecific postoperative edema and fluid collection in the posterior paraspinal soft tissues centered at T12, which could represent a seroma but abscess is not excluded given reported purulent drainage. There also is nonspecific fluid surrounding the posterior elements at L2-L3. 2. Mild edema within the discs at T10-T11 and T12-L1, which could be degenerative or stress related but early discitis is difficult to exclude given the clinical history. No clearly infectious findings within the canal, although evaluation is limited by metallic artifact and motion. If there is clinical concern for infection, consider correlation with inflammatory markers and close interval follow-up MRI with contrast as clinically indicated. 3. Improved canal stenosis and foraminal stenosis at L1-L2 status post decompression and fusion. 4. Mild to  moderate canal stenosis at T9-T10 and T10-T11 appears similar versus slightly progressed. Similar mild canal stenosis at T7-T8. Electronically Signed   By: Margaretha Sheffield M.D.   On: 11/26/2020 16:39   DG Chest Port 1V same Day  Result Date: 11/27/2020 CLINICAL DATA:  Dyspnea. EXAM: PORTABLE CHEST 1 VIEW COMPARISON:  11/25/2020 FINDINGS: There is a nasogastric tube with tip below the GE junction. Stable cardiomediastinal contours. Low lung volumes with asymmetric elevation of the right hemidiaphragm. No airspace opacities. No pleural effusion or edema. IMPRESSION: Low lung volumes.  No acute findings. Electronically Signed   By: Kerby Moors M.D.   On: 11/27/2020 09:01   DG Abd Portable 1V  Result Date: 11/27/2020 CLINICAL DATA:  Follow-up ileus EXAM: PORTABLE ABDOMEN - 1 VIEW COMPARISON:  11/26/2020 FINDINGS: Similar ileus pattern  with gas in small and large bowel. Nasogastric tube enters the stomach. Previously seen rectal tube has been removed. No finding to suggest free air on the supine images. IMPRESSION: Persistent similar ileus pattern. Electronically Signed   By: Nelson Chimes M.D.   On: 11/27/2020 13:43     Scheduled medications:    HYDROmorphone       [MAR Hold] aspirin  81 mg Oral Daily   [MAR Hold] budesonide (PULMICORT) nebulizer solution  0.25 mg Nebulization Q6H   [MAR Hold] Carbidopa-Levodopa ER  1 tablet Oral QID   chlorhexidine       [MAR Hold] Chlorhexidine Gluconate Cloth  6 each Topical Daily   [MAR Hold] heparin  5,000 Units Subcutaneous Q8H   [MAR Hold] insulin aspart  0-9 Units Subcutaneous Q4H   [MAR Hold] loratadine  10 mg Oral Daily   [MAR Hold] metoCLOPramide (REGLAN) injection  10 mg Intravenous Q12H   metoprolol tartrate       metoprolol tartrate  2.5 mg Intravenous Once   [MAR Hold] mupirocin ointment  1 application Nasal BID   [MAR Hold] pantoprazole (PROTONIX) IV  40 mg Intravenous Q24H   [MAR Hold] PARoxetine  40 mg Oral Daily   And   [MAR Hold]  PARoxetine  20 mg Oral QHS   [MAR Hold] phenytoin (DILANTIN) IV  100 mg Intravenous Q8H    Antibiotics: Anti-infectives (From admission, onward)    Start     Dose/Rate Route Frequency Ordered Stop   11/27/20 1200  [MAR Hold]  nafcillin 12 g in sodium chloride 0.9 % 500 mL continuous infusion        (MAR Hold since Tue 11/27/2020 at 1446.Hold Reason: Transfer to a Procedural area)   12 g 20.8 mL/hr over 24 Hours Intravenous Every 24 hours 11/27/20 1007     11/25/20 1400  ceFAZolin (ANCEF) IVPB 2g/100 mL premix  Status:  Discontinued        2 g 200 mL/hr over 30 Minutes Intravenous Every 8 hours 11/25/20 0915 11/27/20 1007   11/24/20 1400  piperacillin-tazobactam (ZOSYN) IVPB 3.375 g  Status:  Discontinued       See Hyperspace for full Linked Orders Report.   3.375 g 12.5 mL/hr over 240 Minutes Intravenous Every 8 hours 11/24/20 0755 11/25/20 0915   11/24/20 0800  piperacillin-tazobactam (ZOSYN) IVPB 3.375 g       See Hyperspace for full Linked Orders Report.   3.375 g 100 mL/hr over 30 Minutes Intravenous  Once 11/24/20 0755 11/24/20 0925         DVT prophylaxis: Heparin  Code Status: Full code  Family Communication: Discussed with patient's wife at bedside   Consultants:           Procedures:     Objective    Physical Examination:   General-appears in no acute distress Heart-S1-S2, regular, no murmur auscultated Lungs-clear to auscultation bilaterally, no wheezing or crackles auscultated Abdomen-soft, diffuse tenderness to palpation, distended , no organomegaly Extremities-no edema in the lower extremities Neuro-alert, oriented x3, no focal deficit noted  Status is: Inpatient  Dispo: The patient is from: Home              Anticipated d/c is to: To be decided              Anticipated d/c date is: 12/02/2020              Patient currently not stable for discharge  Barrier to discharge-ongoing evaluation for MSSA bacteremia,  spinal abscess  COVID-19  Labs  Recent Labs    11/25/20 1551 11/26/20 0454  CRP 73.8* 57.5*    Lab Results  Component Value Date   SARSCOV2NAA NEGATIVE 11/24/2020   SARSCOV2NAA RESULT: NEGATIVE 10/29/2020   SARSCOV2NAA RESULT: NEGATIVE 10/22/2020   SARSCOV2NAA POSITIVE (A) 04/20/2020              [place]   [facility] If 7PM-7AM, please contact night-coverage at [email or web address], Office  [phone]   11/27/2020, 5:39 PM  LOS: 3 days

## 2020-11-27 NOTE — Progress Notes (Signed)
Tele called that pt's HR sustaining at 160's  SV tach, on call paged, awaiting any orders, will continue to monitor, cardizem at 15mg  at the moment

## 2020-11-27 NOTE — Consult Note (Addendum)
NAME:  Danny Hampton, MRN:  [id number], DOB:  [birth year], LOS: 3 ADMISSION DATE:  11/23/2020, CONSULTATION DATE: 11/27/2020 REFERRING MD: [facility]ad, CHIEF COMPLAINT: Back and abdominal pain  History of Present Illness:  65 year old with a extensive past medical history is well-documented below.  He had spinal surgery August 24 of this year and returns with chief complaints of abdominal distention significant for ileus.  He was transferred from [facility] to [facility] for further evaluation by neurosurgery Dr. Saintclair Halsted due to his initial surgery.  He is positive for MSSA bacteremia for which he is on nafcillin.  If further develop atrial fibrillation with rapid ventricular response to 170s and is proven refractory to diltiazem drip.  He is noted to have a mottled lower extremities appears to be decompressing and will be transferred to intensive care unit for further evaluation and treatment  Pertinent  Medical History   Past Medical History:  Diagnosis Date   Acid reflux    Anxiety    Arrhythmia    paroxysmal supreventricular tachycardia   Arthritis    Complication of anesthesia    Myotonia congenita   Dysrhythmia    Essential hypertension    Family history of adverse reaction to anesthesia    Sister   High cholesterol    History of skin cancer    Myotonia congenita    Type 2 diabetes mellitus ([place])      Significant Hospital Events: Including procedures, antibiotic start and stop dates in addition to other pertinent events   11/27/2020 transfer intensive care unit  Interim History / Subjective:  65 year old with a plethora of health issues recent spinal surgery returns with small bowel obstruction most likely from narcotics and now has an atrial febrile ventricular response with mottled extremities critical care will transfer to the intensive care for closer monitoring  Objective   Blood pressure (!) 131/92, pulse (!) 167, temperature 98 F (36.7 C), temperature source  Oral, resp. rate (!) 26, height 5\' 4"  (1.626 m), weight 94.6 kg, SpO2 94 %.        Intake/Output Summary (Last 24 hours) at 11/27/2020 1238 Last data filed at 11/26/2020 2015 Gross per 24 hour  Intake 118.67 ml  Output 825 ml  Net -706.33 ml   Filed Weights   11/25/20 0421 11/26/20 0411 11/27/20 0454  Weight: 85.5 kg 95.7 kg 94.6 kg    Examination: General: Obese male in no acute distress at this HENT: Component of vocal cord dysfunction no JVD or lymphadenopathy is appreciated Lungs: Bilateral wheezing Cardiovascular: Heart sounds are distant atrial fibrillation rapid trickle rate of 1 7 Abdomen: Distended no bowel sounds NG tube in place Extremities: Lower EXTR are mottled Neuro: Grossly intact complains of pain with any movement GU: Amber urine      Resolved Hospital Problem list     Assessment & Plan:  Bacteremia with MSSA status post spinal surgery with copious purulent drainage from thoracic wound. Currently on nafcillin Infectious diseases consulted Transfer to intensive care unit Neurosurgery to be involved  Atrial fibrillation rapid ventricular response with adequate blood pressure at this time. Cardiology is following currently on a diltiazem drip Adequate blood pressure Transferred to intensive care unit Monitor for decompensation  Suspected ileus and small bowel obstruction N.p.o. General surgery is following  Hyperglycemia CBG (last 3)  Recent Labs    11/27/20 0640 11/27/20 0732 11/27/20 1245  GLUCAP 142* 139* 181*   Sliding-scale insulin protocol  History of asthma Bronchodilators  Diet/type: NPO DVT  prophylaxis: prophylactic heparin  GI prophylaxis: N/A Lines: N/A Foley:  N/A Code Status:  full code Last date of multidisciplinary goals of care discussion [tbd]  Labs   CBC: Recent Labs  Lab 11/23/20 2003 11/24/20 0546 11/25/20 0306 11/26/20 0454 11/27/20 0348  WBC 14.9* 11.9* 6.8 6.4 5.8  NEUTROABS 13.3* 10.1* 6.1 5.3 4.6   HGB 12.4* 12.4* 9.6* 9.8* 10.0*  HCT 36.3* 36.9* 28.1* 29.5* 28.7*  MCV 93.8 96.1 92.7 95.5 92.0  PLT 196 143* 104* 83* 77*    Basic Metabolic Panel: Recent Labs  Lab 11/23/20 2003 11/24/20 0546 11/25/20 0306 11/26/20 0454 11/27/20 0348 11/27/20 0851  NA 130* 130* 124* 124* 130*  --   K 3.8 3.7 3.2* 4.5 4.4  --   CL 88* 89* 90* 91* 95*  --   CO2 27 32 25 22 21*  --   GLUCOSE 184* 175* 154* 138* 128*  --   BUN 19 16 14  25* 23  --   CREATININE 0.90 0.78 0.79 1.36* 1.05  --   CALCIUM 8.9 8.6* 7.4* 7.6* 8.6*  --   MG  --  1.5* 1.9  --   --  1.9  PHOS  --   --   --   --   --  2.4*   GFR: Estimated Creatinine Clearance (by C-G formula based on SCr of 1.05 mg/dL) Male: 59.6 mL/min Male: 72.8 mL/min Recent Labs  Lab 11/24/20 0546 11/24/20 0816 11/24/20 1157 11/24/20 1535 11/25/20 0306 11/26/20 0454 11/27/20 0348 11/27/20 0851 11/27/20 1013  PROCALCITON 1.14  --   --   --  2.44 3.95  --   --   --   WBC 11.9*  --   --   --  6.8 6.4 5.8  --   --   LATICACIDVEN 2.5*   < > 2.6* 1.7  --   --   --  1.8 1.9   < > = values in this interval not displayed.    Liver Function Tests: Recent Labs  Lab 11/23/20 2003 11/24/20 0546 11/25/20 0306 11/26/20 0454 11/27/20 0348  AST 33 33 41 330* 393*  ALT 20 17 9 13  77*  ALKPHOS 100 90 60 55 50  BILITOT 0.6 0.6 0.6 0.8 1.0  PROT 7.6 7.0 5.6* 6.4* 5.9*  ALBUMIN 4.1 3.6 2.7* 2.8* 2.5*   No results for input(s): LIPASE, AMYLASE in the last 168 hours. No results for input(s): AMMONIA in the last 168 hours.  ABG No results found for: PHART, PCO2ART, PO2ART, HCO3, TCO2, ACIDBASEDEF, O2SAT   Coagulation Profile: No results for input(s): INR, PROTIME in the last 168 hours.  Cardiac Enzymes: No results for input(s): CKTOTAL, CKMB, CKMBINDEX, TROPONINI in the last 168 hours.  HbA1C: Hgb A1c MFr Bld  Date/Time Value Ref Range Status  10/22/2020 10:48 AM 6.5 (H) 4.8 - 5.6 % Final    Comment:    (NOTE) Pre diabetes:           5.7%-6.4%  Diabetes:              >6.4%  Glycemic control for   <7.0% adults with diabetes   04/22/2020 06:05 AM 6.7 (H) 4.8 - 5.6 % Final    Comment:    (NOTE) Pre diabetes:          5.7%-6.4%  Diabetes:              >6.4%  Glycemic control for   <7.0% adults with diabetes  CBG: Recent Labs  Lab 11/26/20 2013 11/27/20 0000 11/27/20 0357 11/27/20 0640 11/27/20 0732  GLUCAP 113* 134* 139* 142* 139*    Review of Systems:   10 point review of system taken, please see HPI for positives and negatives.   Past Medical History:  He,  has a past medical history of Acid reflux, Anxiety, Arrhythmia, Arthritis, Complication of anesthesia, Dysrhythmia, Essential hypertension, Family history of adverse reaction to anesthesia, High cholesterol, History of skin cancer, Myotonia congenita, and Type 2 diabetes mellitus ([place]).   Surgical History:   Past Surgical History:  Procedure Laterality Date   BACK SURGERY     times 2   CARPAL TUNNEL RELEASE     CATARACT EXTRACTION W/PHACO Left 01/31/2019   Procedure: CATARACT EXTRACTION PHACO AND INTRAOCULAR LENS PLACEMENT (IOC);  Surgeon: Baruch Goldmann, MD;  Location: [facility];  Service: Ophthalmology;  Laterality: Left;  CDE: 2.66   NOSE SURGERY     TOTAL KNEE ARTHROPLASTY Right 10/12/2018   Procedure: RIGHT TOTAL KNEE ARTHROPLASTY;  Surgeon: Garald Balding, MD;  Location: [facility];  Service: Orthopedics;  Laterality: Right;     Social History:   reports that he quit smoking about 2 years ago. His smoking use included cigarettes. He has a 30.00 pack-year smoking history. He has never used smokeless tobacco. He reports that he does not currently use alcohol. He reports that he does not use drugs.   Family History:  His family history includes Cancer in his father; Healthy in his father and mother.   Allergies No Known Allergies   Home Medications  Prior to Admission medications   Medication Sig Start Date End Date Taking?  Authorizing Provider  aspirin 81 MG chewable tablet Chew 1 tablet (81 mg total) by mouth daily. 04/26/20  Yes Barton Dubois, MD  atorvastatin (LIPITOR) 80 MG tablet Take 40 mg by mouth daily.    Yes [provider]  Carbidopa-Levodopa ER 48.75-195 MG CPCR Take 1 tablet by mouth 4 (four) times daily.   Yes [provider]  Cholecalciferol (VITAMIN D3) 125 MCG (5000 UT) TABS Take 5,000 Units by mouth daily.    Yes [provider]  cyclobenzaprine (FLEXERIL) 10 MG tablet Take 1 tablet (10 mg total) by mouth 3 (three) times daily as needed for muscle spasms. 11/03/20  Yes Judith Part, MD  docusate sodium (COLACE) 100 MG capsule Take 100 mg by mouth daily.   Yes [provider]  hydrOXYzine (ATARAX/VISTARIL) 25 MG tablet Take 25 mg by mouth in the morning, at noon, in the evening, and at bedtime.   Yes [provider]  lisinopril-hydrochlorothiazide (ZESTORETIC) 20-25 MG tablet Take 1 tablet by mouth daily.   Yes [provider]  loratadine (CLARITIN) 10 MG tablet Take 10 mg by mouth daily.   Yes [provider]  LORazepam (ATIVAN) 1 MG tablet Take 1.5 tablets by mouth in the morning, at noon, and at bedtime. 07/18/20  Yes [provider]  meloxicam (MOBIC) 15 MG tablet Take 15 mg by mouth daily.   Yes [provider]  metFORMIN (GLUCOPHAGE) 500 MG tablet Take 500 mg by mouth 2 (two) times daily with a meal.    Yes [provider]  metoprolol succinate (TOPROL XL) 25 MG 24 hr tablet Take 1 tablet (25 mg total) by mouth daily. 02/01/19  Yes Satira Sark, MD  Multiple Vitamin (MULTI-VITAMIN) tablet Take 1 tablet by mouth daily.   Yes [provider]  pantoprazole (PROTONIX) 40  MG tablet Take 40 mg by mouth daily.   Yes [provider]  PARoxetine (PAXIL) 40 MG tablet Take 20-40 mg by mouth See admin instructions. Take 40 mg by mouth in the morning and 20 mg in the evening   Yes [provider]  phenytoin (DILANTIN) 100 MG ER capsule Take 100 mg by mouth 3 (three) times daily.   Yes [provider]  rOPINIRole (REQUIP) 1 MG tablet Take 0.5mg  at 6pm and 1mg  at 9pm. Patient taking differently: Take 0.5 mg by mouth See admin instructions. Take 0.5 mg by mouth every evening and 6 pm and again at 9 pm 03/21/19  Yes Patel, Donika K, DO  sodium chloride (MURO 128) 5 % ophthalmic solution Place 1 drop into the right eye every 3 (three) hours as needed for eye irritation.   Yes [provider]  traMADol (ULTRAM) 50 MG tablet Take by mouth. 11/23/20 11/28/20 Yes [provider]  zinc sulfate 220 (50 Zn) MG capsule Take 1 capsule (220 mg total) by mouth daily. 04/26/20  Yes Barton Dubois, MD  albuterol (VENTOLIN HFA) 108 (90 Base) MCG/ACT inhaler Inhale 2 puffs into the lungs every 6 (six) hours as needed for wheezing or shortness of breath. Patient not taking: Reported on 11/23/2020 04/26/20   Barton Dubois, MD  ascorbic acid (VITAMIN C) 500 MG tablet Take 1 tablet (500 mg total) by mouth daily. Patient not taking: Reported on 10/16/2020 04/26/20   Barton Dubois, MD  guaiFENesin-dextromethorphan [facility] DM) 100-10 MG/5ML syrup Take 10 mLs by mouth every 6 (six) hours as needed for cough. Patient not taking: Reported on 11/23/2020 04/26/20   Barton Dubois, MD  methocarbamol (ROBAXIN) 500 MG tablet Take by mouth. Patient not taking: No sig reported 11/23/20 12/03/20  [provider]  oxyCODONE (OXY IR/ROXICODONE) 5 MG immediate release tablet Take 1 tablet (5 mg total) by mouth every 3 (three) hours as needed (pain). Patient not taking: No sig reported 11/03/20   Judith Part, MD     Critical care time: [place] ACNP Acute Care Nurse Practitioner [place] Please consult Amion 11/27/2020, 12:39 PM

## 2020-11-27 NOTE — Op Note (Signed)
Date of surgery: 11/27/2020 Preoperative diagnosis: Postoperative wound infection Postop diagnosis: Same Procedure: Debridement of postoperative lumbar wound infection Surgeon: Kristeen Miss Anesthesia: General endotracheal Indications: Patient is a 65 year old individual who underwent surgery on 24 August for decompression of adjacent level above previous lumbar fusion.  He underwent fixation from T10-L3.  He recovered in the hospital and was discharged home on 827.  He has developed increasing back pain and some mental confusion and presented to [facility], ER.  An MRI demonstrates presence of small postoperative fluid collection in the superior portion of the bed but no evidence of any frank pus.  The patient became septic and was transferred to the ICU.  About this time it was noted that there is a small amount of drainage from the superior portion of the incision consistent with pus.  Cultures were taken and the patient was started on antibiotics however because of his septic condition he is being taken to the operating room to undergo surgical debridement of the lumbar wound.  Procedure: Patient was brought to the operating room supine on the stretcher.  After placement of an arterial line and appropriate central venous monitoring lines he was carefully turned prone.  The back was prepped with alcohol DuraPrep and draped in a sterile fashion.  The previously noted opening was then enlarged using a pair of Metzenbaum scissors and approximately 20 cc of pus was evacuated from an opening measuring approximately 5 cm in length on the superior aspect of the incision.  The area was irrigated from this pus and probing of the cavity yielded no other extensions of pus into any other areas.  External pressure along the wound border and inferiorly yielded no other cavities and no other pus.  On the superior aspect it was felt that some of the area penetrated below the fascia however palpating these regions yielded  no expression of pus.  With thorough exploration of the wound being performed and debridement of some of the soft tissues in the region was felt that the infection was localized to the superiormost portion of the incision.  A sponge was then crafted to fit into the wound and the VAC dressing was applied.  Blood loss for this procedure was nil.  No cultures were sent as preoperative cultures had been obtained in this wound.

## 2020-11-27 NOTE — Consult Note (Signed)
[facility]    Date of Admission:  11/23/2020     Reason for Consult:MSSA bacteremia     Referring Physician: Garwin Brothers consult  Current antibiotics: Cefazolin 9/18-pres  Previous antibiotics: Zosyn 9/17   ASSESSMENT:    65 y.o. adult admitted with:  MSSA bacteremia: Suspected secondary to infection from recent spinal surgery with hardware placement and MRI with concerns for possible early osteomyelitis and abscess.  TTE without obvious vegetations and repeat blood cultures from 11/25/2020 currently no growth to date.  Neurosurgery evaluation pending. Ileus: With small bowel and colonic distention status post NG tube decompression.  General surgery and gastroenterology consulted. Severe sepsis: Secondary to #1 Atrial fibrillation with RVR: In the setting of sepsis and cardiology following. Elevated LFTs Hypoxic respiratory failure Diabetes with A1c 6.5 History of right knee arthroplasty 10/2018 Encephalopathy: likely 2/2 sepsis although consideration of septic emboli in setting of bacteremia Myoclonus congenita   RECOMMENDATIONS:    Transition to nafcillin for preferred CNS coverage in the setting of confusion and MSSA bacteremia MRI brain to exclude CNS septic emboli TEE Await neurosurgery recommendations for intervention regarding spine infection, however, this is complicated by his other ongoing medical problems Hepatitis panel Lab monitoring Follow-up repeat blood cultures Monitor for other foci of infection Wound care, glycemic control Will follow   Principal Problem:   Staphylococcus aureus bacteremia Active Problems:   Acute respiratory failure with hypoxia (HCC)   GERD (gastroesophageal reflux disease)   Myotonia congenita   Hyperlipidemia   Intractable abdominal pain   Abdominal distension   Generalized abdominal pain   Postoperative wound infection   Ileus, postoperative (HCC)   Acute exacerbation of chronic low back  pain   MEDICATIONS:    Scheduled Meds: . aspirin  81 mg Oral Daily  . Carbidopa-Levodopa ER  1 tablet Oral QID  . Chlorhexidine Gluconate Cloth  6 each Topical Daily  . heparin  5,000 Units Subcutaneous Q8H  . insulin aspart  0-9 Units Subcutaneous Q4H  . loratadine  10 mg Oral Daily  . metoCLOPramide (REGLAN) injection  10 mg Intravenous Q12H  . pantoprazole (PROTONIX) IV  40 mg Intravenous Q24H  . PARoxetine  40 mg Oral Daily   And  . PARoxetine  20 mg Oral QHS  . phenytoin (DILANTIN) IV  100 mg Intravenous Q8H   Continuous Infusions: . 0.9 % NaCl with KCl 20 mEq / L 70 mL/hr at 11/27/20 3267  . diltiazem (CARDIZEM) infusion 20 mg/hr (11/27/20 0909)  . nafcillin (NAFCIL) continuous infusion     PRN Meds:.acetaminophen **OR** acetaminophen, albuterol, HYDROmorphone (DILAUDID) injection, hydrOXYzine, LORazepam, ondansetron **OR** ondansetron (ZOFRAN) IV, oxyCODONE  HPI:    Nieko Clarin is a 65 y.o. adult with a past medical history of acid reflux, hypertension, hyperlipidemia, myotonia congenita, diabetes, prior right knee replacement in 2020, recent posterior lumbar fusion with hardware placement 10/31/2020 who presented to [facility] 11/23/2020 with chief complaint of abdominal pain and back pain.  He was diagnosed with an ileus and also noted to have purulent drainage from his surgical incision.  He was febrile and septic and blood cultures grew MSSA in all 4 bottles.  He was started on cefazolin.  He was transferred to [facility] for neurosurgical evaluation.  He had an NG tube placed for decompression in the setting of ileus.  His repeat blood cultures are no growth to date, TTE was negative for obvious vegetations, and MRI of the spine showed the possibility of  early discitis at T10-T11 and T12-L1.  There was also a nonspecific fluid collection centered at T12 which could represent seroma versus abscess.  He also had CT of the abdomen pelvis at admission which noted  ileus.  CTA of the chest was negative for PE or other significant pulmonary process.  He has developed hypoxia which is thought to be secondary to diaphragmatic inhibition due to his abdominal distention.  Surgery and GI are consulted for help with managing his ileus.  He also developed A. fib with RVR last night and cardiology has been consulted this morning.  Today, his wife is present with him and reports that he is more confused than normal.  He continues to complain of significant abdominal pain and back pain.  He has no other new joint pain including his prior right knee replacement.  There is no overt weakness or loss of sensation.  He is not experiencing significant chest pain although does have some shortness of breath.   Past Medical History:  Diagnosis Date  . Acid reflux   . Anxiety   . Arrhythmia    paroxysmal supreventricular tachycardia  . Arthritis   . Complication of anesthesia    Myotonia congenita  . Dysrhythmia   . Essential hypertension   . Family history of adverse reaction to anesthesia    Sister  . High cholesterol   . History of skin cancer   . Myotonia congenita   . Type 2 diabetes mellitus (HCC)     Social History   Tobacco Use  . Smoking status: Former    Packs/day: 1.00    Years: 30.00    Pack years: 30.00    Types: Cigarettes    Quit date: 03/2018    Years since quitting: 2.7  . Smokeless tobacco: Never  Vaping Use  . Vaping Use: Never used  Substance Use Topics  . Alcohol use: Not Currently    Comment: occasional drink  . Drug use: No    Family History  Problem Relation Age of Onset  . Healthy Mother   . Healthy Father   . Cancer Father     No Known Allergies  Review of Systems  Constitutional:  Positive for fever.  Eyes:  Positive for double vision.  Respiratory: Negative.    Cardiovascular: Negative.   Gastrointestinal:  Positive for abdominal pain, constipation, nausea and vomiting.  Genitourinary: Negative.   Musculoskeletal:   Positive for back pain. Negative for joint pain.  Skin: Negative.   Neurological:        + confusion  All other systems reviewed and are negative.  OBJECTIVE:   Blood pressure 114/79, pulse (!) 155, temperature 98 F (36.7 C), temperature source Oral, resp. rate 20, height 5\' 4"  (1.626 m), weight 94.6 kg, SpO2 93 %. Body mass index is 35.8 kg/m.  Physical Exam Constitutional:      Appearance: He is obese. He is ill-appearing.  HENT:     Head: Normocephalic and atraumatic.     Mouth/Throat:     Comments: Dentition is poor.  Eyes:     Extraocular Movements: Extraocular movements intact.     Conjunctiva/sclera: Conjunctivae normal.  Cardiovascular:     Rate and Rhythm: Tachycardia present. Rhythm irregular.  Pulmonary:     Comments: Mildly dyspneic, diminished at bases.  Abdominal:     General: There is distension.     Tenderness: There is abdominal tenderness.  Musculoskeletal:     Comments: Prior Right TKA incision.  Knee is not  warm or erythematous.   Skin:    General: Skin is warm and dry.     Findings: No rash.     Comments: Tattoos noted.   Neurological:     General: No focal deficit present.     Mental Status: He is alert.     Comments: His wife reports that he is confused.      Lab Results: Lab Results  Component Value Date   WBC 5.8 11/27/2020   HGB 10.0 (L) 11/27/2020   HCT 28.7 (L) 11/27/2020   MCV 92.0 11/27/2020   PLT 77 (L) 11/27/2020    Lab Results  Component Value Date   NA 130 (L) 11/27/2020   K 4.4 11/27/2020   CO2 21 (L) 11/27/2020   GLUCOSE 128 (H) 11/27/2020   BUN 23 11/27/2020   CREATININE 1.05 11/27/2020   CALCIUM 8.6 (L) 11/27/2020   GFRNONAA >60 11/27/2020   GFRAA >60 01/27/2019    Lab Results  Component Value Date   ALT 77 (H) 11/27/2020   AST 393 (H) 11/27/2020   ALKPHOS 50 11/27/2020   BILITOT 1.0 11/27/2020       Component Value Date/Time   CRP 57.5 (H) 11/26/2020 0454       Component Value Date/Time    ESRSEDRATE 73 (H) 11/26/2020 0454    I have reviewed the micro and lab results in Epic.  Imaging: DG Abd 1 View  Result Date: 11/26/2020 CLINICAL DATA:  Constipation with blood in stool EXAM: ABDOMEN - 1 VIEW COMPARISON:  11/25/2020 FINDINGS: Rectal catheter is in place. Nasogastric tube is in place. There is gas within small and large bowel which could be due to ileus or pseudo-obstruction of the elderly. Extensive previous spinal fusion with multiple spinal augmentations as seen previously. IMPRESSION: Rectal tube now in place. Intestinal gas consistent with ileus. No other significant change since yesterday. Electronically Signed   By: Nelson Chimes M.D.   On: 11/26/2020 08:51   DG Abd 1 View  Result Date: 11/25/2020 CLINICAL DATA:  NG tube placement. EXAM: ABDOMEN - 1 VIEW COMPARISON:  Abdominal x-ray 11/25/2020. FINDINGS: Nasogastric tube tip is in the proximal stomach, just beyond the gastroesophageal junction. Again seen is diffuse gaseous distension of the colon. This is slightly decreased when compared to the prior study. Air seen within central nondilated small bowel. The visualized lung bases are clear. Thoracolumbar fusion hardware is again seen. IMPRESSION: 1. Nasogastric tube tip is in the proximal stomach just beyond the gastroesophageal junction. Recommend advancing tube. 2. Gaseous distension of the entire colon is again seen. Degree of distention has slightly improved. Continued follow-up recommended. Electronically Signed   By: Ronney Asters M.D.   On: 11/25/2020 21:09   MR CERVICAL SPINE WO CONTRAST  Result Date: 11/26/2020 CLINICAL DATA:  Spinal fusion, thoracic, follow up Epidural abscess; Spinal fusion, lumbosacral, follow up EXAM: MRI CERVICAL, THORACIC AND LUMBAR SPINE WITHOUT AND WITH CONTRAST TECHNIQUE: Multiplanar and multiecho pulse sequences of the cervical spine, to include the craniocervical junction and cervicothoracic junction, and thoracic and lumbar spine, were  obtained without and with intravenous contrast. CONTRAST:  26mL GADAVIST GADOBUTROL 1 MMOL/ML IV SOLN COMPARISON:  CT myelogram September 03, 2020. FINDINGS: MRI CERVICAL SPINE FINDINGS Significantly motion limited study.  Within this limitation: Alignment: Similar alignment with reversal of the normal cervical lordosis and grade 1 anterolisthesis of C3 on C4, C4 on C5, and C6 on C7. Vertebrae: Motion limited evaluation. Degenerative/discogenic endplate signal changes at multiple levels. Otherwise, no  obvious marrow edema to suggest acute fracture or discitis/osteomyelitis. Cord: Motion limited evaluation without definite cord signal abnormality. Posterior Fossa, vertebral arteries, paraspinal tissues: Visualized vertebral artery flow voids are maintained. No acute findings in the visualized posterior fossa. There is nonspecific ill-defined edema in the posterior paraspinal soft tissues. Disc levels: C2-C3: No significant disc protrusion, foraminal stenosis, or canal stenosis. C3-C4: Posterior disc osteophyte complex with left greater than right facet and uncovertebral hypertrophy. Resulting moderate to severe left foraminal stenosis and mild canal stenosis. C4-C5: Posterior disc osteophyte complex with right greater than left facet and uncovertebral hypertrophy. Resulting severe right foraminal stenosis and moderate canal stenosis. Flattening of the ventral cord. C5-C6: Posterior disc osteophyte complex with mild bilateral facet and uncovertebral hypertrophy. Mild canal stenosis without significant foraminal stenosis. C6-C7: Posterior disc osteophyte complex with central inferiorly dissecting disc protrusion versus calcified PLL (as seen on prior myelogram). Resulting moderate canal stenosis. Bilateral facet and uncovertebral hypertrophy with mild to moderate bilateral foraminal stenosis. C7-T1: Posterior disc osteophyte complex with left greater than right facet and uncovertebral hypertrophy. Central disc protrusion  with inferior extension. Resulting mild-to-moderate canal stenosis and mild bilateral foraminal stenosis. MRI THORACIC SPINE FINDINGS Alignment:  Normal. Vertebrae: Interval posterior fusion spanning from T10 inferiorly into the lumbar spine. Nonspecific mild edema within the disc spaces at T10-T11, T11-T12, and T12-L1. Cord:  Normal cord signal. Paraspinal and other soft tissues: Postoperative fluid collection in the posterior paraspinal soft tissues in the lower thoracic spine and lumbar spine (detailed further below). Disc levels: Mild Mild disc bulging at multiple upper to mid thoracic levels. At T7-T8 right paracentral disc protrusion flattens the ventral cord with mild canal stenosis, similar to prior. At T8-T9, disc bulging and endplate spurring results in mild canal stenosis, similar. At T9-T10, evaluation is limited by metallic artifact; however, posterior disc bulging appears to result in mild to moderate canal stenosis, possibly progressed from the prior but poorly characterized. Interval extension of the patient's lumbar fusion superiorly to T10. At T10-T11, the canal appears to be mild to moderately moderately narrowed, similar versus slightly progressed. At T11-T12 the canal appears to be patent. MRI LUMBAR SPINE FINDINGS Motion limited evaluation.  Within this limitation: Segmentation:  Standard. Alignment:  Normal. Vertebrae:  Interval posterior fusion spanning from T10 to S1. Conus medullaris and cauda equina: Conus extends to the L1-L2 level. Conus appears normal. Paraspinal and other soft tissues: Postoperative edema in the paraspinal soft tissues with fluid collection in the paraspinal soft tissues measuring up to 8 x 3 x 63 mm, centered at T12-L1. There is also nonspecific fluid surrounding the posterior elements bilaterally at L2-L3. Disc levels: T12-L1: Posterior fusion. Mild canal stenosis. Limited evaluation of foramina due to metallic artifact. L1-L2: Posterior fusion and decompression.  Improved canal stenosis with patent canal. Limited evaluation of the foramina due to metallic artifact with visualized portions appearing patent. L2-L3: Prior posterior decompression and fusion. Osteophytic ridging. Facet hypertrophy. Mild canal stenosis, similar. Limited evaluation of foramina with likely mild bilateral foraminal stenosis. L3-L4: Prior posterior fusion and decompression. Patent canal and foramina. L4-L5: Prior posterior fusion and decompression. Patent canal with probably mild bilateral foraminal stenosis. L5-S1: Prior posterior fusion and decompression. Patent canal with mild left foraminal stenosis. IMPRESSION: MRI cervical spine: 1. Similar moderate canal stenosis at C4-C5 and C6-C7 2. Similar severe foraminal stenosis on the right at C4-C5 and moderate to severe foraminal stenosis on the left at C3-C4. 3. Similar inferiorly dissecting disc protrusion versus ossified PLL behind the C7  vertebral body with mild-to-moderate canal stenosis at C7-T1. 4. Nonspecific ill-defined edema in the posterior paraspinal soft tissues, which could represent strain or cellulitis. MRI thoracic and lumbar spine: 1. Status post extension of lumbar fusion superiorly to T10. Nonspecific postoperative edema and fluid collection in the posterior paraspinal soft tissues centered at T12, which could represent a seroma but abscess is not excluded given reported purulent drainage. There also is nonspecific fluid surrounding the posterior elements at L2-L3. 2. Mild edema within the discs at T10-T11 and T12-L1, which could be degenerative or stress related but early discitis is difficult to exclude given the clinical history. No clearly infectious findings within the canal, although evaluation is limited by metallic artifact and motion. If there is clinical concern for infection, consider correlation with inflammatory markers and close interval follow-up MRI with contrast as clinically indicated. 3. Improved canal stenosis  and foraminal stenosis at L1-L2 status post decompression and fusion. 4. Mild to moderate canal stenosis at T9-T10 and T10-T11 appears similar versus slightly progressed. Similar mild canal stenosis at T7-T8. Electronically Signed   By: Margaretha Sheffield M.D.   On: 11/26/2020 16:39   MR THORACIC SPINE W WO CONTRAST  Result Date: 11/26/2020 CLINICAL DATA:  Spinal fusion, thoracic, follow up Epidural abscess; Spinal fusion, lumbosacral, follow up EXAM: MRI CERVICAL, THORACIC AND LUMBAR SPINE WITHOUT AND WITH CONTRAST TECHNIQUE: Multiplanar and multiecho pulse sequences of the cervical spine, to include the craniocervical junction and cervicothoracic junction, and thoracic and lumbar spine, were obtained without and with intravenous contrast. CONTRAST:  62mL GADAVIST GADOBUTROL 1 MMOL/ML IV SOLN COMPARISON:  CT myelogram September 03, 2020. FINDINGS: MRI CERVICAL SPINE FINDINGS Significantly motion limited study.  Within this limitation: Alignment: Similar alignment with reversal of the normal cervical lordosis and grade 1 anterolisthesis of C3 on C4, C4 on C5, and C6 on C7. Vertebrae: Motion limited evaluation. Degenerative/discogenic endplate signal changes at multiple levels. Otherwise, no obvious marrow edema to suggest acute fracture or discitis/osteomyelitis. Cord: Motion limited evaluation without definite cord signal abnormality. Posterior Fossa, vertebral arteries, paraspinal tissues: Visualized vertebral artery flow voids are maintained. No acute findings in the visualized posterior fossa. There is nonspecific ill-defined edema in the posterior paraspinal soft tissues. Disc levels: C2-C3: No significant disc protrusion, foraminal stenosis, or canal stenosis. C3-C4: Posterior disc osteophyte complex with left greater than right facet and uncovertebral hypertrophy. Resulting moderate to severe left foraminal stenosis and mild canal stenosis. C4-C5: Posterior disc osteophyte complex with right greater than left  facet and uncovertebral hypertrophy. Resulting severe right foraminal stenosis and moderate canal stenosis. Flattening of the ventral cord. C5-C6: Posterior disc osteophyte complex with mild bilateral facet and uncovertebral hypertrophy. Mild canal stenosis without significant foraminal stenosis. C6-C7: Posterior disc osteophyte complex with central inferiorly dissecting disc protrusion versus calcified PLL (as seen on prior myelogram). Resulting moderate canal stenosis. Bilateral facet and uncovertebral hypertrophy with mild to moderate bilateral foraminal stenosis. C7-T1: Posterior disc osteophyte complex with left greater than right facet and uncovertebral hypertrophy. Central disc protrusion with inferior extension. Resulting mild-to-moderate canal stenosis and mild bilateral foraminal stenosis. MRI THORACIC SPINE FINDINGS Alignment:  Normal. Vertebrae: Interval posterior fusion spanning from T10 inferiorly into the lumbar spine. Nonspecific mild edema within the disc spaces at T10-T11, T11-T12, and T12-L1. Cord:  Normal cord signal. Paraspinal and other soft tissues: Postoperative fluid collection in the posterior paraspinal soft tissues in the lower thoracic spine and lumbar spine (detailed further below). Disc levels: Mild Mild disc bulging at multiple upper to  mid thoracic levels. At T7-T8 right paracentral disc protrusion flattens the ventral cord with mild canal stenosis, similar to prior. At T8-T9, disc bulging and endplate spurring results in mild canal stenosis, similar. At T9-T10, evaluation is limited by metallic artifact; however, posterior disc bulging appears to result in mild to moderate canal stenosis, possibly progressed from the prior but poorly characterized. Interval extension of the patient's lumbar fusion superiorly to T10. At T10-T11, the canal appears to be mild to moderately moderately narrowed, similar versus slightly progressed. At T11-T12 the canal appears to be patent. MRI LUMBAR  SPINE FINDINGS Motion limited evaluation.  Within this limitation: Segmentation:  Standard. Alignment:  Normal. Vertebrae:  Interval posterior fusion spanning from T10 to S1. Conus medullaris and cauda equina: Conus extends to the L1-L2 level. Conus appears normal. Paraspinal and other soft tissues: Postoperative edema in the paraspinal soft tissues with fluid collection in the paraspinal soft tissues measuring up to 8 x 3 x 63 mm, centered at T12-L1. There is also nonspecific fluid surrounding the posterior elements bilaterally at L2-L3. Disc levels: T12-L1: Posterior fusion. Mild canal stenosis. Limited evaluation of foramina due to metallic artifact. L1-L2: Posterior fusion and decompression. Improved canal stenosis with patent canal. Limited evaluation of the foramina due to metallic artifact with visualized portions appearing patent. L2-L3: Prior posterior decompression and fusion. Osteophytic ridging. Facet hypertrophy. Mild canal stenosis, similar. Limited evaluation of foramina with likely mild bilateral foraminal stenosis. L3-L4: Prior posterior fusion and decompression. Patent canal and foramina. L4-L5: Prior posterior fusion and decompression. Patent canal with probably mild bilateral foraminal stenosis. L5-S1: Prior posterior fusion and decompression. Patent canal with mild left foraminal stenosis. IMPRESSION: MRI cervical spine: 1. Similar moderate canal stenosis at C4-C5 and C6-C7 2. Similar severe foraminal stenosis on the right at C4-C5 and moderate to severe foraminal stenosis on the left at C3-C4. 3. 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 4. Nonspecific ill-defined edema in the posterior paraspinal soft tissues, which could represent strain or cellulitis. MRI thoracic and lumbar spine: 1. Status post extension of lumbar fusion superiorly to T10. Nonspecific postoperative edema and fluid collection  in the posterior paraspinal soft tissues centered at T12, which could represent a seroma but abscess is not excluded given reported purulent drainage. There also is nonspecific fluid surrounding the posterior elements at L2-L3. 2. Mild edema within the discs at T10-T11 and T12-L1, which could be degenerative or stress related but early discitis is difficult to exclude given the clinical history. No clearly infectious findings within the canal, although evaluation is limited by metallic artifact and motion. If there is clinical concern for infection, consider correlation with inflammatory markers and close interval follow-up MRI with contrast as clinically indicated. 3. Improved canal stenosis and foraminal stenosis at L1-L2 status post decompression and fusion. 4. Mild to moderate canal stenosis at T9-T10 and T10-T11 appears similar versus slightly progressed. Similar mild canal stenosis at T7-T8. Electronically Signed   By: Margaretha Sheffield M.D.   On: 11/26/2020 16:39   MR Lumbar Spine W Wo Contrast  Result Date: 11/26/2020 CLINICAL DATA:  Spinal fusion, thoracic, follow up Epidural abscess; Spinal fusion, lumbosacral, follow up EXAM: MRI CERVICAL, THORACIC AND LUMBAR SPINE WITHOUT AND WITH CONTRAST TECHNIQUE: Multiplanar and multiecho pulse sequences of the cervical spine, to include the craniocervical junction and cervicothoracic junction, and thoracic and lumbar spine, were obtained without and with intravenous contrast. CONTRAST:  29mL GADAVIST GADOBUTROL 1 MMOL/ML  IV SOLN COMPARISON:  CT myelogram September 03, 2020. FINDINGS: MRI CERVICAL SPINE FINDINGS Significantly motion limited study.  Within this limitation: Alignment: Similar alignment with reversal of the normal cervical lordosis and grade 1 anterolisthesis of C3 on C4, C4 on C5, and C6 on C7. Vertebrae: Motion limited evaluation. Degenerative/discogenic endplate signal changes at multiple levels. Otherwise, no obvious marrow edema to suggest acute  fracture or discitis/osteomyelitis. Cord: Motion limited evaluation without definite cord signal abnormality. Posterior Fossa, vertebral arteries, paraspinal tissues: Visualized vertebral artery flow voids are maintained. No acute findings in the visualized posterior fossa. There is nonspecific ill-defined edema in the posterior paraspinal soft tissues. Disc levels: C2-C3: No significant disc protrusion, foraminal stenosis, or canal stenosis. C3-C4: Posterior disc osteophyte complex with left greater than right facet and uncovertebral hypertrophy. Resulting moderate to severe left foraminal stenosis and mild canal stenosis. C4-C5: Posterior disc osteophyte complex with right greater than left facet and uncovertebral hypertrophy. Resulting severe right foraminal stenosis and moderate canal stenosis. Flattening of the ventral cord. C5-C6: Posterior disc osteophyte complex with mild bilateral facet and uncovertebral hypertrophy. Mild canal stenosis without significant foraminal stenosis. C6-C7: Posterior disc osteophyte complex with central inferiorly dissecting disc protrusion versus calcified PLL (as seen on prior myelogram). Resulting moderate canal stenosis. Bilateral facet and uncovertebral hypertrophy with mild to moderate bilateral foraminal stenosis. C7-T1: Posterior disc osteophyte complex with left greater than right facet and uncovertebral hypertrophy. Central disc protrusion with inferior extension. Resulting mild-to-moderate canal stenosis and mild bilateral foraminal stenosis. MRI THORACIC SPINE FINDINGS Alignment:  Normal. Vertebrae: Interval posterior fusion spanning from T10 inferiorly into the lumbar spine. Nonspecific mild edema within the disc spaces at T10-T11, T11-T12, and T12-L1. Cord:  Normal cord signal. Paraspinal and other soft tissues: Postoperative fluid collection in the posterior paraspinal soft tissues in the lower thoracic spine and lumbar spine (detailed further below). Disc levels:  Mild Mild disc bulging at multiple upper to mid thoracic levels. At T7-T8 right paracentral disc protrusion flattens the ventral cord with mild canal stenosis, similar to prior. At T8-T9, disc bulging and endplate spurring results in mild canal stenosis, similar. At T9-T10, evaluation is limited by metallic artifact; however, posterior disc bulging appears to result in mild to moderate canal stenosis, possibly progressed from the prior but poorly characterized. Interval extension of the patient's lumbar fusion superiorly to T10. At T10-T11, the canal appears to be mild to moderately moderately narrowed, similar versus slightly progressed. At T11-T12 the canal appears to be patent. MRI LUMBAR SPINE FINDINGS Motion limited evaluation.  Within this limitation: Segmentation:  Standard. Alignment:  Normal. Vertebrae:  Interval posterior fusion spanning from T10 to S1. Conus medullaris and cauda equina: Conus extends to the L1-L2 level. Conus appears normal. Paraspinal and other soft tissues: Postoperative edema in the paraspinal soft tissues with fluid collection in the paraspinal soft tissues measuring up to 8 x 3 x 63 mm, centered at T12-L1. There is also nonspecific fluid surrounding the posterior elements bilaterally at L2-L3. Disc levels: T12-L1: Posterior fusion. Mild canal stenosis. Limited evaluation of foramina due to metallic artifact. L1-L2: Posterior fusion and decompression. Improved canal stenosis with patent canal. Limited evaluation of the foramina due to metallic artifact with visualized portions appearing patent. L2-L3: Prior posterior decompression and fusion. Osteophytic ridging. Facet hypertrophy. Mild canal stenosis, similar. Limited evaluation of foramina with likely mild bilateral foraminal stenosis. L3-L4: Prior posterior fusion and decompression. Patent canal and foramina. L4-L5: Prior posterior fusion and decompression. Patent canal with probably mild bilateral foraminal  stenosis. L5-S1: Prior  posterior fusion and decompression. Patent canal with mild left foraminal stenosis. IMPRESSION: MRI cervical spine: 1. Similar moderate canal stenosis at C4-C5 and C6-C7 2. Similar severe foraminal stenosis on the right at C4-C5 and moderate to severe foraminal stenosis on the left at C3-C4. 3. 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 4. Nonspecific ill-defined edema in the posterior paraspinal soft tissues, which could represent strain or cellulitis. MRI thoracic and lumbar spine: 1. Status post extension of lumbar fusion superiorly to T10. Nonspecific postoperative edema and fluid collection in the posterior paraspinal soft tissues centered at T12, which could represent a seroma but abscess is not excluded given reported purulent drainage. There also is nonspecific fluid surrounding the posterior elements at L2-L3. 2. Mild edema within the discs at T10-T11 and T12-L1, which could be degenerative or stress related but early discitis is difficult to exclude given the clinical history. No clearly infectious findings within the canal, although evaluation is limited by metallic artifact and motion. If there is clinical concern for infection, consider correlation with inflammatory markers and close interval follow-up MRI with contrast as clinically indicated. 3. Improved canal stenosis and foraminal stenosis at L1-L2 status post decompression and fusion. 4. Mild to moderate canal stenosis at T9-T10 and T10-T11 appears similar versus slightly progressed. Similar mild canal stenosis at T7-T8. Electronically Signed   By: Margaretha Sheffield M.D.   On: 11/26/2020 16:39   DG Chest Port 1V same Day  Result Date: 11/27/2020 CLINICAL DATA:  Dyspnea. EXAM: PORTABLE CHEST 1 VIEW COMPARISON:  11/25/2020 FINDINGS: There is a nasogastric tube with tip below the GE junction. Stable cardiomediastinal contours. Low lung volumes with  asymmetric elevation of the right hemidiaphragm. No airspace opacities. No pleural effusion or edema. IMPRESSION: Low lung volumes.  No acute findings. Electronically Signed   By: Kerby Moors M.D.   On: 11/27/2020 09:01   ECHOCARDIOGRAM COMPLETE  Result Date: 11/25/2020    ECHOCARDIOGRAM REPORT   Patient Name:   MARQUIST BINSTOCK Dewan Date of Exam: 11/25/2020 Medical Rec #:  [id number]            Height:       64.0 in Accession #:    [id number]           Weight:       188.5 lb Date of Birth:  04/01/1955            BSA:          1.908 m Patient Age:    18 years             BP:           108/74 mmHg Patient Gender: M                    HR:           109 bpm. Exam Location:  [facility] Procedure: 2D Echo, Cardiac Doppler and Color Doppler Indications:    Bacteremia R78.81  History:        Patient has prior history of Echocardiogram examinations, most                 recent 01/13/2019. Risk Factors:Hypertension, Dyslipidemia,                 Former Smoker and Diabetes. Dysrhythmia (From Hx).  Sonographer:    Alvino Chapel RCS Referring Phys: [place]  IMPRESSIONS  1. Left ventricular ejection fraction, by estimation, is 60 to 65%. The left ventricle has normal function. The left ventricle has no regional wall motion abnormalities. There is mild concentric left ventricular hypertrophy. Left ventricular diastolic parameters were normal.  2. Right ventricular systolic function is mildly reduced. The right ventricular size is moderately enlarged. Tricuspid regurgitation signal is inadequate for assessing PA pressure.  3. The mitral valve is grossly normal. Trivial mitral valve regurgitation. No evidence of mitral stenosis.  4. The aortic valve is tricuspid. Aortic valve regurgitation is not visualized. No aortic stenosis is present.  5. The inferior vena cava is normal in size with <50% respiratory variability, suggesting right atrial pressure of 8 mmHg. Comparison(s): Changes from prior study are noted. LV  function remains unchanged. RV appears moderately dilated with mildly reduced function which is new from prior study. Conclusion(s)/Recommendation(s): No evidence of valvular vegetations on this transthoracic echocardiogram. Would recommend a transesophageal echocardiogram to exclude infective endocarditis if clinically indicated. FINDINGS  Left Ventricle: Left ventricular ejection fraction, by estimation, is 60 to 65%. The left ventricle has normal function. The left ventricle has no regional wall motion abnormalities. The left ventricular internal cavity size was normal in size. There is  mild concentric left ventricular hypertrophy. Left ventricular diastolic parameters were normal. Right Ventricle: The right ventricular size is moderately enlarged. No increase in right ventricular wall thickness. Right ventricular systolic function is mildly reduced. Tricuspid regurgitation signal is inadequate for assessing PA pressure. Left Atrium: Left atrial size was normal in size. Right Atrium: Right atrial size was normal in size. Pericardium: Trivial pericardial effusion is present. Presence of pericardial fat pad. Mitral Valve: The mitral valve is grossly normal. Trivial mitral valve regurgitation. No evidence of mitral valve stenosis. Tricuspid Valve: The tricuspid valve is grossly normal. Tricuspid valve regurgitation is trivial. No evidence of tricuspid stenosis. Aortic Valve: The aortic valve is tricuspid. Aortic valve regurgitation is not visualized. No aortic stenosis is present. Pulmonic Valve: The pulmonic valve was grossly normal. Pulmonic valve regurgitation is trivial. No evidence of pulmonic stenosis. Aorta: The aortic root is normal in size and structure. Venous: The inferior vena cava is normal in size with less than 50% respiratory variability, suggesting right atrial pressure of 8 mmHg. IAS/Shunts: The atrial septum is grossly normal.  LEFT VENTRICLE PLAX 2D LVIDd:         4.45 cm  Diastology LVIDs:          2.80 cm  LV e' medial:    7.29 cm/s LV PW:         1.30 cm  LV E/e' medial:  12.1 LV IVS:        1.10 cm  LV e' lateral:   10.69 cm/s LVOT diam:     2.10 cm  LV E/e' lateral: 8.3 LV SV:         72 LV SV Index:   37 LVOT Area:     3.46 cm  RIGHT VENTRICLE RV S prime:     14.60 cm/s TAPSE (M-mode): 2.0 cm LEFT ATRIUM             Index       RIGHT ATRIUM           Index LA diam:        2.80 cm 1.47 cm/m  RA Area:     16.60 cm LA Vol (A2C):   54.8 ml 28.73 ml/m RA Volume:   42.10 ml  22.07 ml/m LA  Vol (A4C):   53.7 ml 28.15 ml/m LA Biplane Vol: 57.3 ml 30.04 ml/m  AORTIC VALVE LVOT Vmax:   111.50 cm/s LVOT Vmean:  78.900 cm/s LVOT VTI:    0.207 m  AORTA Ao Root diam: 3.70 cm MITRAL VALVE MV Area (PHT): 4.07 cm     SHUNTS MV Decel Time: 187 msec     Systemic VTI:  0.21 m MV E velocity: 88.50 cm/s   Systemic Diam: 2.10 cm MV A velocity: 112.00 cm/s MV E/A ratio:  0.79 Eleonore Chiquito MD Electronically signed by Eleonore Chiquito MD Signature Date/Time: 11/25/2020/3:42:15 PM    Final      Imaging independently reviewed in Epic.  [facility] [phone] pager 11/27/2020, 11:07 AM

## 2020-11-27 NOTE — Progress Notes (Signed)
Patient ID: Danny Hampton, adult   DOB: Jul 25, 1955, 65 y.o.   MRN: [id number] Modest output from drain Patient's back feels somewhat better a bit more comfortable Motor function is intact and his sensorium appears to be a bit cleared now Continue with VAC dressing

## 2020-11-27 NOTE — Significant Event (Signed)
Rapid Response Event Note   Reason for Call :  HR 160s  Initial Focused Assessment:  Patient is alert and oriented. He is anxious. He has severe back and abdominal pain. Abdomen very distended. He has an NG to [facility]. His legs are mottled Oral mucosa is dry Upper airway wheeze Right hand IV leaking, D/C'd  BP 132/92 HR 160s RR 20s O2 sats 94% on 6L [place]  GI, Cardiology and ID consulted today  Mid back incision with brown purulent drainage  Interventions:  Cardizem gtt increased to 20mg  Additional 10mg  Cardizem given IV Ativan and Dilaudid given for anxiety and sever pain.  IV team placed 20 ga PIV   CCM consulted.  Dr Tacy Learn and Richardson Landry NP at bedside to assess patient  1L LR bolus  Transferred to ICU  Plan of Care:     Event Summary:   MD Notified: Dr Darrick Meigs Call Time: [phone] Arrival Time: 0815 End Time: 0865  Raliegh Ip, RN

## 2020-11-27 NOTE — Anesthesia Procedure Notes (Addendum)
Central Venous Catheter Insertion Performed by: Nolon Nations, MD, anesthesiologist Start/End9/20/2022 3:25 PM, 11/27/2020 3:45 PM Patient location: OR. Preanesthetic checklist: patient identified, IV checked, site marked, risks and benefits discussed, surgical consent, monitors and equipment checked, pre-op evaluation, timeout performed and anesthesia consent Position: Trendelenburg Lidocaine 1% used for infiltration and patient sedated Hand hygiene performed  and maximum sterile barriers used  Catheter size: 8.5 Fr Sheath introducer Procedure performed using ultrasound guided technique. Ultrasound Notes:anatomy identified, needle tip was noted to be adjacent to the nerve/plexus identified, no ultrasound evidence of intravascular and/or intraneural injection and image(s) printed for medical record Attempts: 1 Following insertion, line sutured, dressing applied and Biopatch. Post procedure assessment: blood return through all ports, free fluid flow and no air  Patient tolerated the procedure well with no immediate complications.

## 2020-11-27 NOTE — Consult Note (Addendum)
Cardiology Consultation:   Patient ID: Hamid Brookens MRN: [id number]; DOB: August 29, 1955  Admit date: 11/23/2020 Date of Consult: 11/27/2020  PCP:  [facility] Cardiologist:  Rozann Lesches, MD   {    Patient Profile:   Kairi Tufo is a 65 y.o. adult with a hx of GERD, paroxysmal SVT, HTN, Type 2 DM, HLD, Myotonia congenita, who is being seen 11/27/2020 for the evaluation of A fib RVR at the request of Dr. Hal Hope.   History of Present Illness:   [facility] Domenic Polite outpatient for HTN, hx of SVT, and HLD. Last seen in the office 01/27/19 had some palpitations, cardiac monitor showed SR with average rate 80s, brief episode of SVTs, he was started on metoprolol XL 25mg  daily.   Patient had back surgery on 10/31/20, presented on 11/23/20 with c/o of abdominal pain with distention, subsequently admitted to family medicine for MSSA bacteremia,  post-op wound infection, post-operative ileus, ? Ogilvie's Syndrome, hypoxia.  RRT was called on 11/27/20 early morning due to tachycardia, EKG concerning for A fib RVR, he was given IV metoprolol and cardizem bolus without improvement of rate control, started on cardizem gtt and also given total 18 mg adenosine bolus without rate control.  Cardiology is consulted for further input today.   Echo from 11/25/20 showed EF 60-65%, no RWMA, mild concentric LVH,  Trivial MR. RV appears moderately dilated with mildly reduced function which is new from prior study.   Labs from today showed hyponatremia 130, new onset of transaminitis with AST 393, ALT 77, anemia with Hgb 10.   During encounter he is very uncomfortable, reports uncontrolled pain from his back and abdomen. He denied any heart palpitation that was similar to his complaints in 2020 when he had SVTs. He states he chest hurts when he takes a deep breath, he had significant abdominal distention that is making him feeling hard to breath.  He feels very dry and wants ice. He states he is slightly dizzy but has not been out of bed.      Past Medical History:  Diagnosis Date   Acid reflux    Anxiety    Arrhythmia    paroxysmal supreventricular tachycardia   Arthritis    Complication of anesthesia    Myotonia congenita   Dysrhythmia    Essential hypertension    Family history of adverse reaction to anesthesia    Sister   High cholesterol    History of skin cancer    Myotonia congenita    Type 2 diabetes mellitus ([place])     Past Surgical History:  Procedure Laterality Date   BACK SURGERY     times 2   CARPAL TUNNEL RELEASE     CATARACT EXTRACTION W/PHACO Left 01/31/2019   Procedure: CATARACT EXTRACTION PHACO AND INTRAOCULAR LENS PLACEMENT ([place]);  Surgeon: Baruch Goldmann, MD;  Location: [facility];  Service: Ophthalmology;  Laterality: Left;  CDE: 2.66   NOSE SURGERY     TOTAL KNEE ARTHROPLASTY Right 10/12/2018   Procedure: RIGHT TOTAL KNEE ARTHROPLASTY;  Surgeon: Garald Balding, MD;  Location: [facility];  Service: Orthopedics;  Laterality: Right;     Home Medications:  Prior to Admission medications   Medication Sig Start Date End Date Taking? Authorizing Provider  aspirin 81 MG chewable tablet Chew 1 tablet (81 mg total) by mouth daily. 04/26/20  Yes Barton Dubois, MD  atorvastatin (LIPITOR) 80 MG tablet Take 40 mg by  mouth daily.    Yes [provider]  Carbidopa-Levodopa ER 48.75-195 MG CPCR Take 1 tablet by mouth 4 (four) times daily.   Yes [provider]  Cholecalciferol (VITAMIN D3) 125 MCG (5000 UT) TABS Take 5,000 Units by mouth daily.    Yes [provider]  cyclobenzaprine (FLEXERIL) 10 MG tablet Take 1 tablet (10 mg total) by mouth 3 (three) times daily as needed for muscle spasms. 11/03/20  Yes Judith Part, MD  docusate sodium (COLACE) 100 MG capsule Take 100 mg by mouth daily.   Yes [provider]  hydrOXYzine (ATARAX/VISTARIL) 25 MG tablet Take 25 mg by mouth  in the morning, at noon, in the evening, and at bedtime.   Yes [provider]  lisinopril-hydrochlorothiazide (ZESTORETIC) 20-25 MG tablet Take 1 tablet by mouth daily.   Yes [provider]  loratadine (CLARITIN) 10 MG tablet Take 10 mg by mouth daily.   Yes [provider]  LORazepam (ATIVAN) 1 MG tablet Take 1.5 tablets by mouth in the morning, at noon, and at bedtime. 07/18/20  Yes [provider]  meloxicam (MOBIC) 15 MG tablet Take 15 mg by mouth daily.   Yes [provider]  metFORMIN (GLUCOPHAGE) 500 MG tablet Take 500 mg by mouth 2 (two) times daily with a meal.    Yes [provider]  metoprolol succinate (TOPROL XL) 25 MG 24 hr tablet Take 1 tablet (25 mg total) by mouth daily. 02/01/19  Yes Satira Sark, MD  Multiple Vitamin (MULTI-VITAMIN) tablet Take 1 tablet by mouth daily.   Yes [provider]  pantoprazole (PROTONIX) 40 MG tablet Take 40 mg by mouth daily.   Yes [provider]  PARoxetine (PAXIL) 40 MG tablet Take 20-40 mg by mouth See admin instructions. Take 40 mg by mouth in the morning and 20 mg in the evening   Yes [provider]  phenytoin (DILANTIN) 100 MG ER capsule Take 100 mg by mouth 3 (three) times daily.   Yes [provider]  rOPINIRole (REQUIP) 1 MG tablet Take 0.5mg  at 6pm and 1mg  at 9pm. Patient taking differently: Take 0.5 mg by mouth See admin instructions. Take 0.5 mg by mouth every evening and 6 pm and again at 9 pm 03/21/19  Yes Patel, Donika K, DO  sodium chloride (MURO 128) 5 % ophthalmic solution Place 1 drop into the right eye every 3 (three) hours as needed for eye irritation.   Yes [provider]  traMADol (ULTRAM) 50 MG tablet Take by mouth. 11/23/20 11/28/20 Yes [provider]  zinc sulfate 220 (50 Zn) MG capsule Take 1 capsule (220 mg total) by mouth daily. 04/26/20  Yes Barton Dubois, MD  albuterol (VENTOLIN HFA) 108 (90 Base) MCG/ACT  inhaler Inhale 2 puffs into the lungs every 6 (six) hours as needed for wheezing or shortness of breath. Patient not taking: Reported on 11/23/2020 04/26/20   Barton Dubois, MD  ascorbic acid (VITAMIN C) 500 MG tablet Take 1 tablet (500 mg total) by mouth daily. Patient not taking: Reported on 10/16/2020 04/26/20   Barton Dubois, MD  guaiFENesin-dextromethorphan [facility] DM) 100-10 MG/5ML syrup Take 10 mLs by mouth every 6 (six) hours as needed for cough. Patient not taking: Reported on 11/23/2020 04/26/20   Barton Dubois, MD  methocarbamol (ROBAXIN) 500 MG tablet Take by mouth. Patient not taking: No sig reported 11/23/20 12/03/20  [provider]  oxyCODONE (OXY IR/ROXICODONE) 5 MG immediate release tablet Take 1  tablet (5 mg total) by mouth every 3 (three) hours as needed (pain). Patient not taking: No sig reported 11/03/20   Judith Part, MD    Inpatient Medications: Scheduled Meds:  aspirin  81 mg Oral Daily   Carbidopa-Levodopa ER  1 tablet Oral QID   Chlorhexidine Gluconate Cloth  6 each Topical Daily   diltiazem  10 mg Intravenous Once   heparin  5,000 Units Subcutaneous Q8H   insulin aspart  0-9 Units Subcutaneous Q4H   loratadine  10 mg Oral Daily   metoCLOPramide (REGLAN) injection  10 mg Intravenous Q12H   pantoprazole (PROTONIX) IV  40 mg Intravenous Q24H   PARoxetine  40 mg Oral Daily   And   PARoxetine  20 mg Oral QHS   phenytoin (DILANTIN) IV  100 mg Intravenous Q8H   Continuous Infusions:  0.9 % NaCl with KCl 20 mEq / L 70 mL/hr at 11/27/20 7867    ceFAZolin (ANCEF) IV 2 g (11/27/20 0451)   diltiazem (CARDIZEM) infusion 20 mg/hr (11/27/20 0909)   PRN Meds: acetaminophen **OR** acetaminophen, albuterol, HYDROmorphone (DILAUDID) injection, hydrOXYzine, LORazepam, ondansetron **OR** ondansetron (ZOFRAN) IV, oxyCODONE  Allergies:   No Known Allergies  Social History:   Social History   Socioeconomic History   Marital status: Divorced    Spouse name:  Not on file   Number of children: 0   Years of education: Not on file   Highest education level: Not on file  Occupational History   Occupation: retired Clinical biochemist  Tobacco Use   Smoking status: Former    Packs/day: 1.00    Years: 30.00    Pack years: 30.00    Types: Cigarettes    Quit date: 03/2018    Years since quitting: 2.7   Smokeless tobacco: Never  Vaping Use   Vaping Use: Never used  Substance and Sexual Activity   Alcohol use: Not Currently    Comment: occasional drink   Drug use: No   Sexual activity: Not Currently  Other Topics Concern   Not on file  Social History Narrative   Right handed   Social Determinants of Health   Financial Resource Strain: Not on file  Food Insecurity: Not on file  Transportation Needs: Not on file  Physical Activity: Not on file  Stress: Not on file  Social Connections: Not on file  Intimate Partner Violence: Not on file    Family History:    Family History  Problem Relation Age of Onset   Healthy Mother    Healthy Father    Cancer Father      ROS:  Constitutional: see HPI  Eyes: Denied vision change or loss Ears/Nose/Mouth/Throat: Denied ear ache, sore throat, coughing, sinus pain Cardiovascular:see HPI Respiratory: see HPI Gastrointestinal: see HPI  Genital/Urinary: Denied dysuria, hematuria, urinary frequency/urgency Musculoskeletal: see HPI  Skin: Denied rash, wound Neuro: dizziness  Psych: Denied history of depression/anxiety  Endocrine: history of diabetes     Physical Exam/Data:   Vitals:   11/27/20 0529 11/27/20 0545 11/27/20 0600 11/27/20 0821  BP: (!) 137/108 (!) 145/119 (!) 121/91 114/79  Pulse: (!) 158 (!) 132 (!) 131 (!) 155  Resp: (!) 23 (!) 25 13 20   Temp:   98.1 F (36.7 C) 98 F (36.7 C)  TempSrc:   Oral Oral  SpO2: 94% 93% 94% 93%  Weight:      Height:        Intake/Output Summary (Last 24 hours) at 11/27/2020 6720 Last data filed at  11/26/2020 2015 Gross per 24 hour  Intake 118.67  ml  Output 825 ml  Net -706.33 ml   Last 3 Weights 11/27/2020 11/26/2020 11/25/2020  Weight (lbs) 208 lb 8.9 oz 210 lb 15.7 oz 188 lb 7.9 oz  Weight (kg) 94.6 kg 95.7 kg 85.5 kg     Body mass index is 35.8 kg/m.   Vitals:  Vitals:   11/27/20 0600 11/27/20 0821  BP: (!) 121/91 114/79  Pulse: (!) 131 (!) 155  Resp: 13 20  Temp: 98.1 F (36.7 C) 98 F (36.7 C)  SpO2: 94% 93%   General Appearance: In no apparent distress, laying in bed HEENT: Normocephalic, atraumatic. EOMs intact.  Neck: Supple, trachea midline, no JVDs Cardiovascular: Regular rhythm, tachycardia, S1 S2, no murmur  Respiratory: Resting breathing unlabored, mild DOE, lungs sounds clear to auscultation bilaterally, no use of accessory muscles. On 4L [place].  No wheezes, rales or rhonchi.   Gastrointestinal: Bowel sounds absent, NG in place to suction, abdomen distended with tenderness  Extremities: Able to move all extremities in bed without difficulty, trace edema of BLE Genitourinary: genital exam not performed Musculoskeletal: Normal muscle bulk and tone Skin: Intact, warm, dry. No rashes or petechiae noted in exposed areas.  Neurologic: Alert, oriented to person, place and time. Fluent speech, no cognitive deficit, no focal muscle weakness, no gross focal neuro deficit Psychiatric: anxious, restless    EKG:  The EKG was personally reviewed and demonstrates:  A fib with RVR 140s with associated ST depressions  Telemetry:  Telemetry was personally reviewed and demonstrates:  A fib/flutter versus coarse A fib with ventricular rate of 160s currently     Relevant CV Studies:  Echo from  11/25/20 showed:  1. Left ventricular ejection fraction, by estimation, is 60 to 65%. The  left ventricle has normal function. The left ventricle has no regional  wall motion abnormalities. There is mild concentric left ventricular  hypertrophy. Left ventricular diastolic  parameters were normal.   2. Right ventricular systolic  function is mildly reduced. The right  ventricular size is moderately enlarged. Tricuspid regurgitation signal is  inadequate for assessing PA pressure.   3. The mitral valve is grossly normal. Trivial mitral valve  regurgitation. No evidence of mitral stenosis.   4. The aortic valve is tricuspid. Aortic valve regurgitation is not  visualized. No aortic stenosis is present.   5. The inferior vena cava is normal in size with <50% respiratory  variability, suggesting right atrial pressure of 8 mmHg   Laboratory Data:  High Sensitivity Troponin:  No results for input(s): TROPONINIHS in the last 720 hours.   Chemistry Recent Labs  Lab 11/24/20 0546 11/25/20 0306 11/26/20 0454 11/27/20 0348  NA 130* 124* 124* 130*  K 3.7 3.2* 4.5 4.4  CL 89* 90* 91* 95*  CO2 32 25 22 21*  GLUCOSE 175* 154* 138* 128*  BUN 16 14 25* 23  CREATININE 0.78 0.79 1.36* 1.05  CALCIUM 8.6* 7.4* 7.6* 8.6*  MG 1.5* 1.9  --   --   GFRNONAA >60 >60 58* >60  ANIONGAP 9 9 11 14     Recent Labs  Lab 11/25/20 0306 11/26/20 0454 11/27/20 0348  PROT 5.6* 6.4* 5.9*  ALBUMIN 2.7* 2.8* 2.5*  AST 41 330* 393*  ALT 9 13 77*  ALKPHOS 60 55 50  BILITOT 0.6 0.8 1.0   Lipids No results for input(s): CHOL, TRIG, HDL, LABVLDL, LDLCALC, CHOLHDL in the last 168 hours.  Hematology Recent Labs  Lab 11/25/20 0306 11/26/20 0454 11/27/20 0348  WBC 6.8 6.4 5.8  RBC 3.03* 3.09* 3.12*  HGB 9.6* 9.8* 10.0*  HCT 28.1* 29.5* 28.7*  MCV 92.7 95.5 92.0  MCH 31.7 31.7 32.1  MCHC 34.2 33.2 34.8  RDW 12.3 12.8 13.0  PLT 104* 83* 77*   Thyroid No results for input(s): TSH, FREET4 in the last 168 hours.  BNPNo results for input(s): BNP, PROBNP in the last 168 hours.  DDimer No results for input(s): DDIMER in the last 168 hours.   Radiology/Studies:  DG Abd 1 View  Result Date: 11/26/2020 CLINICAL DATA:  Constipation with blood in stool EXAM: ABDOMEN - 1 VIEW COMPARISON:  11/25/2020 FINDINGS: Rectal catheter is in place.  Nasogastric tube is in place. There is gas within small and large bowel which could be due to ileus or pseudo-obstruction of the elderly. Extensive previous spinal fusion with multiple spinal augmentations as seen previously. IMPRESSION: Rectal tube now in place. Intestinal gas consistent with ileus. No other significant change since yesterday. Electronically Signed   By: Nelson Chimes M.D.   On: 11/26/2020 08:51   DG Abd 1 View  Result Date: 11/25/2020 CLINICAL DATA:  NG tube placement. EXAM: ABDOMEN - 1 VIEW COMPARISON:  Abdominal x-ray 11/25/2020. FINDINGS: Nasogastric tube tip is in the proximal stomach, just beyond the gastroesophageal junction. Again seen is diffuse gaseous distension of the colon. This is slightly decreased when compared to the prior study. Air seen within central nondilated small bowel. The visualized lung bases are clear. Thoracolumbar fusion hardware is again seen. IMPRESSION: 1. Nasogastric tube tip is in the proximal stomach just beyond the gastroesophageal junction. Recommend advancing tube. 2. Gaseous distension of the entire colon is again seen. Degree of distention has slightly improved. Continued follow-up recommended. Electronically Signed   By: Ronney Asters M.D.   On: 11/25/2020 21:09   CT Angio Chest Pulmonary Embolism (PE) W or WO Contrast  Result Date: 11/24/2020 CLINICAL DATA:  Shortness of breath. EXAM: CT ANGIOGRAPHY CHEST WITH CONTRAST TECHNIQUE: Multidetector CT imaging of the chest was performed using the standard protocol during bolus administration of intravenous contrast. Multiplanar CT image reconstructions and MIPs were obtained to evaluate the vascular anatomy. CONTRAST:  55mL OMNIPAQUE IOHEXOL 350 MG/ML SOLN COMPARISON:  November 22, 2020 FINDINGS: Cardiovascular: There is mild calcification of the aortic arch without evidence of aortic aneurysm or dissection. Satisfactory opacification of the pulmonary arteries to the segmental level. No evidence of  pulmonary embolism. Normal heart size. No pericardial effusion. Mediastinum/Nodes: No enlarged mediastinal, hilar, or axillary lymph nodes. Thyroid gland, trachea, and esophagus demonstrate no significant findings. Lungs/Pleura: Mild atelectasis is seen within the bilateral lower lobes and anterior aspect of the left upper lobe. There is no evidence of a pleural effusion or pneumothorax. Upper Abdomen: No acute abnormality. Musculoskeletal: Bilateral metallic density pedicle screws are seen within the lower thoracic spine. Review of the MIP images confirms the above findings. IMPRESSION: 1. No evidence of pulmonary embolism. 2. Mild bilateral lower lobe and left upper lobe atelectasis. 3. Aortic atherosclerosis. Aortic Atherosclerosis (ICD10-I70.0). Electronically Signed   By: Virgina Norfolk M.D.   On: 11/24/2020 03:51   MR CERVICAL SPINE WO CONTRAST  Result Date: 11/26/2020 CLINICAL DATA:  Spinal fusion, thoracic, follow up Epidural abscess; Spinal fusion, lumbosacral, follow up EXAM: MRI CERVICAL, THORACIC AND LUMBAR SPINE WITHOUT AND WITH CONTRAST TECHNIQUE: Multiplanar and multiecho pulse sequences of the cervical spine, to include the craniocervical junction and cervicothoracic junction, and thoracic and  lumbar spine, were obtained without and with intravenous contrast. CONTRAST:  95mL GADAVIST GADOBUTROL 1 MMOL/ML IV SOLN COMPARISON:  CT myelogram September 03, 2020. FINDINGS: MRI CERVICAL SPINE FINDINGS Significantly motion limited study.  Within this limitation: Alignment: Similar alignment with reversal of the normal cervical lordosis and grade 1 anterolisthesis of C3 on C4, C4 on C5, and C6 on C7. Vertebrae: Motion limited evaluation. Degenerative/discogenic endplate signal changes at multiple levels. Otherwise, no obvious marrow edema to suggest acute fracture or discitis/osteomyelitis. Cord: Motion limited evaluation without definite cord signal abnormality. Posterior Fossa, vertebral arteries,  paraspinal tissues: Visualized vertebral artery flow voids are maintained. No acute findings in the visualized posterior fossa. There is nonspecific ill-defined edema in the posterior paraspinal soft tissues. Disc levels: C2-C3: No significant disc protrusion, foraminal stenosis, or canal stenosis. C3-C4: Posterior disc osteophyte complex with left greater than right facet and uncovertebral hypertrophy. Resulting moderate to severe left foraminal stenosis and mild canal stenosis. C4-C5: Posterior disc osteophyte complex with right greater than left facet and uncovertebral hypertrophy. Resulting severe right foraminal stenosis and moderate canal stenosis. Flattening of the ventral cord. C5-C6: Posterior disc osteophyte complex with mild bilateral facet and uncovertebral hypertrophy. Mild canal stenosis without significant foraminal stenosis. C6-C7: Posterior disc osteophyte complex with central inferiorly dissecting disc protrusion versus calcified PLL (as seen on prior myelogram). Resulting moderate canal stenosis. Bilateral facet and uncovertebral hypertrophy with mild to moderate bilateral foraminal stenosis. C7-T1: Posterior disc osteophyte complex with left greater than right facet and uncovertebral hypertrophy. Central disc protrusion with inferior extension. Resulting mild-to-moderate canal stenosis and mild bilateral foraminal stenosis. MRI THORACIC SPINE FINDINGS Alignment:  Normal. Vertebrae: Interval posterior fusion spanning from T10 inferiorly into the lumbar spine. Nonspecific mild edema within the disc spaces at T10-T11, T11-T12, and T12-L1. Cord:  Normal cord signal. Paraspinal and other soft tissues: Postoperative fluid collection in the posterior paraspinal soft tissues in the lower thoracic spine and lumbar spine (detailed further below). Disc levels: Mild Mild disc bulging at multiple upper to mid thoracic levels. At T7-T8 right paracentral disc protrusion flattens the ventral cord with mild canal  stenosis, similar to prior. At T8-T9, disc bulging and endplate spurring results in mild canal stenosis, similar. At T9-T10, evaluation is limited by metallic artifact; however, posterior disc bulging appears to result in mild to moderate canal stenosis, possibly progressed from the prior but poorly characterized. Interval extension of the patient's lumbar fusion superiorly to T10. At T10-T11, the canal appears to be mild to moderately moderately narrowed, similar versus slightly progressed. At T11-T12 the canal appears to be patent. MRI LUMBAR SPINE FINDINGS Motion limited evaluation.  Within this limitation: Segmentation:  Standard. Alignment:  Normal. Vertebrae:  Interval posterior fusion spanning from T10 to S1. Conus medullaris and cauda equina: Conus extends to the L1-L2 level. Conus appears normal. Paraspinal and other soft tissues: Postoperative edema in the paraspinal soft tissues with fluid collection in the paraspinal soft tissues measuring up to 8 x 3 x 63 mm, centered at T12-L1. There is also nonspecific fluid surrounding the posterior elements bilaterally at L2-L3. Disc levels: T12-L1: Posterior fusion. Mild canal stenosis. Limited evaluation of foramina due to metallic artifact. L1-L2: Posterior fusion and decompression. Improved canal stenosis with patent canal. Limited evaluation of the foramina due to metallic artifact with visualized portions appearing patent. L2-L3: Prior posterior decompression and fusion. Osteophytic ridging. Facet hypertrophy. Mild canal stenosis, similar. Limited evaluation of foramina with likely mild bilateral foraminal stenosis. L3-L4: Prior posterior fusion and decompression. Patent  canal and foramina. L4-L5: Prior posterior fusion and decompression. Patent canal with probably mild bilateral foraminal stenosis. L5-S1: Prior posterior fusion and decompression. Patent canal with mild left foraminal stenosis. IMPRESSION: MRI cervical spine: 1. Similar moderate canal stenosis  at C4-C5 and C6-C7 2. Similar severe foraminal stenosis on the right at C4-C5 and moderate to severe foraminal stenosis on the left at C3-C4. 3. 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 4. Nonspecific ill-defined edema in the posterior paraspinal soft tissues, which could represent strain or cellulitis. MRI thoracic and lumbar spine: 1. Status post extension of lumbar fusion superiorly to T10. Nonspecific postoperative edema and fluid collection in the posterior paraspinal soft tissues centered at T12, which could represent a seroma but abscess is not excluded given reported purulent drainage. There also is nonspecific fluid surrounding the posterior elements at L2-L3. 2. Mild edema within the discs at T10-T11 and T12-L1, which could be degenerative or stress related but early discitis is difficult to exclude given the clinical history. No clearly infectious findings within the canal, although evaluation is limited by metallic artifact and motion. If there is clinical concern for infection, consider correlation with inflammatory markers and close interval follow-up MRI with contrast as clinically indicated. 3. Improved canal stenosis and foraminal stenosis at L1-L2 status post decompression and fusion. 4. Mild to moderate canal stenosis at T9-T10 and T10-T11 appears similar versus slightly progressed. Similar mild canal stenosis at T7-T8. Electronically Signed   By: Margaretha Sheffield M.D.   On: 11/26/2020 16:39   MR THORACIC SPINE W WO CONTRAST  Result Date: 11/26/2020 CLINICAL DATA:  Spinal fusion, thoracic, follow up Epidural abscess; Spinal fusion, lumbosacral, follow up EXAM: MRI CERVICAL, THORACIC AND LUMBAR SPINE WITHOUT AND WITH CONTRAST TECHNIQUE: Multiplanar and multiecho pulse sequences of the cervical spine, to include the craniocervical junction and cervicothoracic junction, and thoracic and lumbar  spine, were obtained without and with intravenous contrast. CONTRAST:  63mL GADAVIST GADOBUTROL 1 MMOL/ML IV SOLN COMPARISON:  CT myelogram September 03, 2020. FINDINGS: MRI CERVICAL SPINE FINDINGS Significantly motion limited study.  Within this limitation: Alignment: Similar alignment with reversal of the normal cervical lordosis and grade 1 anterolisthesis of C3 on C4, C4 on C5, and C6 on C7. Vertebrae: Motion limited evaluation. Degenerative/discogenic endplate signal changes at multiple levels. Otherwise, no obvious marrow edema to suggest acute fracture or discitis/osteomyelitis. Cord: Motion limited evaluation without definite cord signal abnormality. Posterior Fossa, vertebral arteries, paraspinal tissues: Visualized vertebral artery flow voids are maintained. No acute findings in the visualized posterior fossa. There is nonspecific ill-defined edema in the posterior paraspinal soft tissues. Disc levels: C2-C3: No significant disc protrusion, foraminal stenosis, or canal stenosis. C3-C4: Posterior disc osteophyte complex with left greater than right facet and uncovertebral hypertrophy. Resulting moderate to severe left foraminal stenosis and mild canal stenosis. C4-C5: Posterior disc osteophyte complex with right greater than left facet and uncovertebral hypertrophy. Resulting severe right foraminal stenosis and moderate canal stenosis. Flattening of the ventral cord. C5-C6: Posterior disc osteophyte complex with mild bilateral facet and uncovertebral hypertrophy. Mild canal stenosis without significant foraminal stenosis. C6-C7: Posterior disc osteophyte complex with central inferiorly dissecting disc protrusion versus calcified PLL (as seen on prior myelogram). Resulting moderate canal stenosis. Bilateral facet and uncovertebral hypertrophy with mild to moderate bilateral foraminal stenosis. C7-T1: Posterior disc osteophyte complex with left greater than right facet and uncovertebral hypertrophy. Central disc  protrusion with inferior extension. Resulting mild-to-moderate canal  stenosis and mild bilateral foraminal stenosis. MRI THORACIC SPINE FINDINGS Alignment:  Normal. Vertebrae: Interval posterior fusion spanning from T10 inferiorly into the lumbar spine. Nonspecific mild edema within the disc spaces at T10-T11, T11-T12, and T12-L1. Cord:  Normal cord signal. Paraspinal and other soft tissues: Postoperative fluid collection in the posterior paraspinal soft tissues in the lower thoracic spine and lumbar spine (detailed further below). Disc levels: Mild Mild disc bulging at multiple upper to mid thoracic levels. At T7-T8 right paracentral disc protrusion flattens the ventral cord with mild canal stenosis, similar to prior. At T8-T9, disc bulging and endplate spurring results in mild canal stenosis, similar. At T9-T10, evaluation is limited by metallic artifact; however, posterior disc bulging appears to result in mild to moderate canal stenosis, possibly progressed from the prior but poorly characterized. Interval extension of the patient's lumbar fusion superiorly to T10. At T10-T11, the canal appears to be mild to moderately moderately narrowed, similar versus slightly progressed. At T11-T12 the canal appears to be patent. MRI LUMBAR SPINE FINDINGS Motion limited evaluation.  Within this limitation: Segmentation:  Standard. Alignment:  Normal. Vertebrae:  Interval posterior fusion spanning from T10 to S1. Conus medullaris and cauda equina: Conus extends to the L1-L2 level. Conus appears normal. Paraspinal and other soft tissues: Postoperative edema in the paraspinal soft tissues with fluid collection in the paraspinal soft tissues measuring up to 8 x 3 x 63 mm, centered at T12-L1. There is also nonspecific fluid surrounding the posterior elements bilaterally at L2-L3. Disc levels: T12-L1: Posterior fusion. Mild canal stenosis. Limited evaluation of foramina due to metallic artifact. L1-L2: Posterior fusion and  decompression. Improved canal stenosis with patent canal. Limited evaluation of the foramina due to metallic artifact with visualized portions appearing patent. L2-L3: Prior posterior decompression and fusion. Osteophytic ridging. Facet hypertrophy. Mild canal stenosis, similar. Limited evaluation of foramina with likely mild bilateral foraminal stenosis. L3-L4: Prior posterior fusion and decompression. Patent canal and foramina. L4-L5: Prior posterior fusion and decompression. Patent canal with probably mild bilateral foraminal stenosis. L5-S1: Prior posterior fusion and decompression. Patent canal with mild left foraminal stenosis. IMPRESSION: MRI cervical spine: 1. Similar moderate canal stenosis at C4-C5 and C6-C7 2. Similar severe foraminal stenosis on the right at C4-C5 and moderate to severe foraminal stenosis on the left at C3-C4. 3. 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 4. Nonspecific ill-defined edema in the posterior paraspinal soft tissues, which could represent strain or cellulitis. MRI thoracic and lumbar spine: 1. Status post extension of lumbar fusion superiorly to T10. Nonspecific postoperative edema and fluid collection in the posterior paraspinal soft tissues centered at T12, which could represent a seroma but abscess is not excluded given reported purulent drainage. There also is nonspecific fluid surrounding the posterior elements at L2-L3. 2. Mild edema within the discs at T10-T11 and T12-L1, which could be degenerative or stress related but early discitis is difficult to exclude given the clinical history. No clearly infectious findings within the canal, although evaluation is limited by metallic artifact and motion. If there is clinical concern for infection, consider correlation with inflammatory markers and close interval follow-up MRI with contrast as clinically indicated. 3. Improved  canal stenosis and foraminal stenosis at L1-L2 status post decompression and fusion. 4. Mild to moderate canal stenosis at T9-T10 and T10-T11 appears similar versus slightly progressed. Similar mild canal stenosis at T7-T8. Electronically Signed   By: Margaretha Sheffield M.D.   On:  11/26/2020 16:39   MR Lumbar Spine W Wo Contrast  Result Date: 11/26/2020 CLINICAL DATA:  Spinal fusion, thoracic, follow up Epidural abscess; Spinal fusion, lumbosacral, follow up EXAM: MRI CERVICAL, THORACIC AND LUMBAR SPINE WITHOUT AND WITH CONTRAST TECHNIQUE: Multiplanar and multiecho pulse sequences of the cervical spine, to include the craniocervical junction and cervicothoracic junction, and thoracic and lumbar spine, were obtained without and with intravenous contrast. CONTRAST:  58mL GADAVIST GADOBUTROL 1 MMOL/ML IV SOLN COMPARISON:  CT myelogram September 03, 2020. FINDINGS: MRI CERVICAL SPINE FINDINGS Significantly motion limited study.  Within this limitation: Alignment: Similar alignment with reversal of the normal cervical lordosis and grade 1 anterolisthesis of C3 on C4, C4 on C5, and C6 on C7. Vertebrae: Motion limited evaluation. Degenerative/discogenic endplate signal changes at multiple levels. Otherwise, no obvious marrow edema to suggest acute fracture or discitis/osteomyelitis. Cord: Motion limited evaluation without definite cord signal abnormality. Posterior Fossa, vertebral arteries, paraspinal tissues: Visualized vertebral artery flow voids are maintained. No acute findings in the visualized posterior fossa. There is nonspecific ill-defined edema in the posterior paraspinal soft tissues. Disc levels: C2-C3: No significant disc protrusion, foraminal stenosis, or canal stenosis. C3-C4: Posterior disc osteophyte complex with left greater than right facet and uncovertebral hypertrophy. Resulting moderate to severe left foraminal stenosis and mild canal stenosis. C4-C5: Posterior disc osteophyte complex with right  greater than left facet and uncovertebral hypertrophy. Resulting severe right foraminal stenosis and moderate canal stenosis. Flattening of the ventral cord. C5-C6: Posterior disc osteophyte complex with mild bilateral facet and uncovertebral hypertrophy. Mild canal stenosis without significant foraminal stenosis. C6-C7: Posterior disc osteophyte complex with central inferiorly dissecting disc protrusion versus calcified PLL (as seen on prior myelogram). Resulting moderate canal stenosis. Bilateral facet and uncovertebral hypertrophy with mild to moderate bilateral foraminal stenosis. C7-T1: Posterior disc osteophyte complex with left greater than right facet and uncovertebral hypertrophy. Central disc protrusion with inferior extension. Resulting mild-to-moderate canal stenosis and mild bilateral foraminal stenosis. MRI THORACIC SPINE FINDINGS Alignment:  Normal. Vertebrae: Interval posterior fusion spanning from T10 inferiorly into the lumbar spine. Nonspecific mild edema within the disc spaces at T10-T11, T11-T12, and T12-L1. Cord:  Normal cord signal. Paraspinal and other soft tissues: Postoperative fluid collection in the posterior paraspinal soft tissues in the lower thoracic spine and lumbar spine (detailed further below). Disc levels: Mild Mild disc bulging at multiple upper to mid thoracic levels. At T7-T8 right paracentral disc protrusion flattens the ventral cord with mild canal stenosis, similar to prior. At T8-T9, disc bulging and endplate spurring results in mild canal stenosis, similar. At T9-T10, evaluation is limited by metallic artifact; however, posterior disc bulging appears to result in mild to moderate canal stenosis, possibly progressed from the prior but poorly characterized. Interval extension of the patient's lumbar fusion superiorly to T10. At T10-T11, the canal appears to be mild to moderately moderately narrowed, similar versus slightly progressed. At T11-T12 the canal appears to be  patent. MRI LUMBAR SPINE FINDINGS Motion limited evaluation.  Within this limitation: Segmentation:  Standard. Alignment:  Normal. Vertebrae:  Interval posterior fusion spanning from T10 to S1. Conus medullaris and cauda equina: Conus extends to the L1-L2 level. Conus appears normal. Paraspinal and other soft tissues: Postoperative edema in the paraspinal soft tissues with fluid collection in the paraspinal soft tissues measuring up to 8 x 3 x 63 mm, centered at T12-L1. There is also nonspecific fluid surrounding the posterior elements bilaterally at L2-L3. Disc levels: T12-L1: Posterior fusion. Mild canal stenosis. Limited evaluation of  foramina due to metallic artifact. L1-L2: Posterior fusion and decompression. Improved canal stenosis with patent canal. Limited evaluation of the foramina due to metallic artifact with visualized portions appearing patent. L2-L3: Prior posterior decompression and fusion. Osteophytic ridging. Facet hypertrophy. Mild canal stenosis, similar. Limited evaluation of foramina with likely mild bilateral foraminal stenosis. L3-L4: Prior posterior fusion and decompression. Patent canal and foramina. L4-L5: Prior posterior fusion and decompression. Patent canal with probably mild bilateral foraminal stenosis. L5-S1: Prior posterior fusion and decompression. Patent canal with mild left foraminal stenosis. IMPRESSION: MRI cervical spine: 1. Similar moderate canal stenosis at C4-C5 and C6-C7 2. Similar severe foraminal stenosis on the right at C4-C5 and moderate to severe foraminal stenosis on the left at C3-C4. 3. 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 4. Nonspecific ill-defined edema in the posterior paraspinal soft tissues, which could represent strain or cellulitis. MRI thoracic and lumbar spine: 1. Status post extension of lumbar fusion superiorly to T10. Nonspecific postoperative edema  and fluid collection in the posterior paraspinal soft tissues centered at T12, which could represent a seroma but abscess is not excluded given reported purulent drainage. There also is nonspecific fluid surrounding the posterior elements at L2-L3. 2. Mild edema within the discs at T10-T11 and T12-L1, which could be degenerative or stress related but early discitis is difficult to exclude given the clinical history. No clearly infectious findings within the canal, although evaluation is limited by metallic artifact and motion. If there is clinical concern for infection, consider correlation with inflammatory markers and close interval follow-up MRI with contrast as clinically indicated. 3. Improved canal stenosis and foraminal stenosis at L1-L2 status post decompression and fusion. 4. Mild to moderate canal stenosis at T9-T10 and T10-T11 appears similar versus slightly progressed. Similar mild canal stenosis at T7-T8. Electronically Signed   By: Margaretha Sheffield M.D.   On: 11/26/2020 16:39   CT Abdomen Pelvis W Contrast  Result Date: 11/23/2020 CLINICAL DATA:  Abdominal pain and abdominal distension. EXAM: CT ABDOMEN AND PELVIS WITH CONTRAST TECHNIQUE: Multidetector CT imaging of the abdomen and pelvis was performed using the standard protocol following bolus administration of intravenous contrast. CONTRAST:  43mL OMNIPAQUE IOHEXOL 350 MG/ML SOLN COMPARISON:  None. FINDINGS: Lower chest: There is atelectasis in the lung bases. Coronary artery calcifications are present. Hepatobiliary: There is a rounded hypodensity in the left lobe of the liver which is too small to characterize, most likely a small cyst or hemangioma. There is no biliary ductal dilatation. There are no gallstones identified. Pancreas: Unremarkable. No pancreatic ductal dilatation or surrounding inflammatory changes. Spleen: Normal in size without focal abnormality. Adrenals/Urinary Tract: The bilateral adrenal glands are within normal limits.  There is no hydronephrosis or perinephric fluid. There is a rounded hypodensity in the right renal cortex which is too small to characterize, likely a cyst. 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 The bladder is distended with contrast and within normal limits. Stomach/Bowel: There is no free air. Proximal colon is distended with air-fluid levels. The distal colon is decompressed. There is no definitive transition point visualized. There is sigmoid colon diverticulosis without evidence for acute diverticulitis. Additionally, small bowel loops are distended with air-fluid levels measuring up to 3.2 cm. No definite transition point visualized. The stomach appears within normal limits. Appendix is within normal limits. Vascular/Lymphatic: Aortic atherosclerosis. No enlarged abdominal or pelvic lymph nodes. Reproductive: Prostate is unremarkable. Other: There are  small fat containing bilateral inguinal hernias. There is also small fat containing umbilical hernia. There is no ascites. Musculoskeletal: T10-S1 posterior fusion hardware is present. Vertebroplasty changes are seen at T12 and L1. IMPRESSION: 1. Mildly dilated small bowel with air-fluid levels. Findings are concerning for ileus/enteritis. No definite transition point visualized. Partial small bowel obstruction is not excluded. 2. Air-fluid levels distend the proximal colon and the distal colon is relatively decompressed. No definite transition point visualized. Findings can be seen with colonic ileus and/or diarrhea. 3. Sigmoid colon diverticulosis without evidence for diverticulitis. Electronically Signed   By: Ronney Asters M.D.   On: 11/23/2020 22:54   DG CHEST PORT 1 VIEW  Result Date: 11/25/2020 CLINICAL DATA:  Tachypnea. EXAM: PORTABLE CHEST 1 VIEW COMPARISON:  April 20, 2020 FINDINGS: No pneumothorax. The low volume portable technique limits evaluation. No focal infiltrates or overt edema. The  cardiomediastinal silhouette is stable. No other acute abnormalities. IMPRESSION: No acute abnormalities are identified. The study is limited due to the low volume portable technique. Electronically Signed   By: Dorise Bullion III M.D.   On: 11/25/2020 08:15   DG Chest Port 1V same Day  Result Date: 11/27/2020 CLINICAL DATA:  Dyspnea. EXAM: PORTABLE CHEST 1 VIEW COMPARISON:  11/25/2020 FINDINGS: There is a nasogastric tube with tip below the GE junction. Stable cardiomediastinal contours. Low lung volumes with asymmetric elevation of the right hemidiaphragm. No airspace opacities. No pleural effusion or edema. IMPRESSION: Low lung volumes.  No acute findings. Electronically Signed   By: Kerby Moors M.D.   On: 11/27/2020 09:01   DG Abd Portable 1V  Result Date: 11/25/2020 CLINICAL DATA:  Continued abdominal pain. EXAM: PORTABLE ABDOMEN - 1 VIEW COMPARISON:  CT scan of the abdomen November 23, 2020 FINDINGS: Pedicle rods and screws are seen from the lower thoracic spine through the sacrum. Lung bases are poorly visualized but unremarkable. No free air, portal venous gas, or pneumatosis. The colon is air-filled and prominent. Colonic gas extends to the level the rectum. The dilated small bowel loops on the recent CT scan are not as well visualized on this study. IMPRESSION: 1. Prominent air-filled colon with colonic gas extending to the level of the rectum. The findings suggest ileus. Previously identified dilated loops of small bowel are not as well visualized on this study. No other abnormalities. Electronically Signed   By: Dorise Bullion III M.D.   On: 11/25/2020 08:18   ECHOCARDIOGRAM COMPLETE  Result Date: 11/25/2020    ECHOCARDIOGRAM REPORT   Patient Name:   ANVAY TENNIS Balthazor Date of Exam: 11/25/2020 Medical Rec #:  [id number]            Height:       64.0 in Accession #:    [id number]           Weight:       188.5 lb Date of Birth:  10-May-1955            BSA:          1.908 m Patient Age:     50 years             BP:           108/74 mmHg Patient Gender: M                    HR:           109 bpm. Exam Location:  [facility] Procedure: 2D Echo, Cardiac Doppler and  Color Doppler Indications:    Bacteremia R78.81  History:        Patient has prior history of Echocardiogram examinations, most                 recent 01/13/2019. Risk Factors:Hypertension, Dyslipidemia,                 Former Smoker and Diabetes. Dysrhythmia (From Hx).  Sonographer:    Alvino Chapel RCS Referring Phys: [place]  1. Left ventricular ejection fraction, by estimation, is 60 to 65%. The left ventricle has normal function. The left ventricle has no regional wall motion abnormalities. There is mild concentric left ventricular hypertrophy. Left ventricular diastolic parameters were normal.  2. Right ventricular systolic function is mildly reduced. The right ventricular size is moderately enlarged. Tricuspid regurgitation signal is inadequate for assessing PA pressure.  3. The mitral valve is grossly normal. Trivial mitral valve regurgitation. No evidence of mitral stenosis.  4. The aortic valve is tricuspid. Aortic valve regurgitation is not visualized. No aortic stenosis is present.  5. The inferior vena cava is normal in size with <50% respiratory variability, suggesting right atrial pressure of 8 mmHg. Comparison(s): Changes from prior study are noted. LV function remains unchanged. RV appears moderately dilated with mildly reduced function which is new from prior study. Conclusion(s)/Recommendation(s): No evidence of valvular vegetations on this transthoracic echocardiogram. Would recommend a transesophageal echocardiogram to exclude infective endocarditis if clinically indicated. FINDINGS  Left Ventricle: Left ventricular ejection fraction, by estimation, is 60 to 65%. The left ventricle has normal function. The left ventricle has no regional wall motion abnormalities. The left ventricular internal  cavity size was normal in size. There is  mild concentric left ventricular hypertrophy. Left ventricular diastolic parameters were normal. Right Ventricle: The right ventricular size is moderately enlarged. No increase in right ventricular wall thickness. Right ventricular systolic function is mildly reduced. Tricuspid regurgitation signal is inadequate for assessing PA pressure. Left Atrium: Left atrial size was normal in size. Right Atrium: Right atrial size was normal in size. Pericardium: Trivial pericardial effusion is present. Presence of pericardial fat pad. Mitral Valve: The mitral valve is grossly normal. Trivial mitral valve regurgitation. No evidence of mitral valve stenosis. Tricuspid Valve: The tricuspid valve is grossly normal. Tricuspid valve regurgitation is trivial. No evidence of tricuspid stenosis. Aortic Valve: The aortic valve is tricuspid. Aortic valve regurgitation is not visualized. No aortic stenosis is present. Pulmonic Valve: The pulmonic valve was grossly normal. Pulmonic valve regurgitation is trivial. No evidence of pulmonic stenosis. Aorta: The aortic root is normal in size and structure. Venous: The inferior vena cava is normal in size with less than 50% respiratory variability, suggesting right atrial pressure of 8 mmHg. IAS/Shunts: The atrial septum is grossly normal.  LEFT VENTRICLE PLAX 2D LVIDd:         4.45 cm  Diastology LVIDs:         2.80 cm  LV e' medial:    7.29 cm/s LV PW:         1.30 cm  LV E/e' medial:  12.1 LV IVS:        1.10 cm  LV e' lateral:   10.69 cm/s LVOT diam:     2.10 cm  LV E/e' lateral: 8.3 LV SV:         72 LV SV Index:   37 LVOT Area:     3.46 cm  RIGHT VENTRICLE RV S prime:  14.60 cm/s TAPSE (M-mode): 2.0 cm LEFT ATRIUM             Index       RIGHT ATRIUM           Index LA diam:        2.80 cm 1.47 cm/m  RA Area:     16.60 cm LA Vol (A2C):   54.8 ml 28.73 ml/m RA Volume:   42.10 ml  22.07 ml/m LA Vol (A4C):   53.7 ml 28.15 ml/m LA Biplane Vol:  57.3 ml 30.04 ml/m  AORTIC VALVE LVOT Vmax:   111.50 cm/s LVOT Vmean:  78.900 cm/s LVOT VTI:    0.207 m  AORTA Ao Root diam: 3.70 cm MITRAL VALVE MV Area (PHT): 4.07 cm     SHUNTS MV Decel Time: 187 msec     Systemic VTI:  0.21 m MV E velocity: 88.50 cm/s   Systemic Diam: 2.10 cm MV A velocity: 112.00 cm/s MV E/A ratio:  0.79 Eleonore Chiquito MD Electronically signed by Eleonore Chiquito MD Signature Date/Time: 11/25/2020/3:42:15 PM    Final      Assessment and Plan:   New onset of A fib/flutter with RVR  - suspect due to underlying acute illness including MSSA bacteremia, postop ileus, wound infection, uncontrolled pain, hypoxia  -Clinically uncomfortable from uncontrolled pain, not significantly symptomatic from rapid heart rate -will add on labs including TSH, mag, Phos -Inadequate rate control from metoprolol, Cardizem, adenosine so far -Continue to treat underlying illness - discussed with MD regarding rate/rhythm control with amiodarone, although new onset of transaminitis within 48 hours noted which maybe limitinh, MD recommend increased diltiazem drip to max 20 mg/hr and additional 10mg  IV cardizem for now, if rate control remains inadequate, may pursue amiodarone; consider digoxin if needed - Clinically without significant volume overload today, CXR clear - CHA2DS2-VASc score is 3, if A. 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 The patient's score is based upon: CHF History: 0 HTN History: 1 Diabetes History: 1 Stroke History: 0 Vascular Disease History: 0 Age Score: 1 Gender Score: 0       For questions or updates, please contact [place] Please consult [email or web address] for contact info under    Signed, Margie Billet, NP  11/27/2020 9:24 AM  I have examined the patient and reviewed assessment and plan and discussed with patient.  Agree with above as stated.    Significant wheezing noted bilaterally.  Telemetry personally reviewed.  Atrial flutter with 2:1 conduction noted at times.  Brief episodes of atrial fibrillation with rapid ventricular response.  High-dose Cardizem is not helping with rate control.  I suspect once the underlying infection is treated, his atrial flutter will improve.  If he becomes unstable, he will need emergent DC cardioversion.  I am not sure whether he is a candidate for anticoagulation given the back surgery.  Wound from his back is also leaking  some clear fluid.  No obvious bleeding.  If he is a candidate for anticoagulation, would start IV heparin. For further rate control, could try IV amiodarone, 150 mg bolus followed by IV infusion.  Will follow.  Larae Grooms

## 2020-11-27 NOTE — Progress Notes (Signed)
MD on call and [facility] RN at bedside

## 2020-11-27 NOTE — Anesthesia Procedure Notes (Signed)
Procedure Name: Intubation Date/Time: 11/27/2020 3:21 PM Performed by: Clearnce Sorrel, CRNA Pre-anesthesia Checklist: Patient identified, Emergency Drugs available, Suction available and Patient being monitored Patient Re-evaluated:Patient Re-evaluated prior to induction Oxygen Delivery Method: Circle System Utilized Preoxygenation: Pre-oxygenation with 100% oxygen Induction Type: IV induction Ventilation: Mask ventilation without difficulty Laryngoscope Size: Mac and 3 Grade View: Grade I Tube type: Oral Tube size: 7.5 mm Number of attempts: 1 Airway Equipment and Method: Stylet and Oral airway Placement Confirmation: ETT inserted through vocal cords under direct vision, positive ETCO2 and breath sounds checked- equal and bilateral Secured at: 23 cm Tube secured with: Tape Dental Injury: Teeth and Oropharynx as per pre-operative assessment

## 2020-11-27 NOTE — Consult Note (Addendum)
[facility] Consult: 9:17 AM 11/27/2020  LOS: 3 days    Referring Provider: Dr Darrick Meigs  Primary Care Physician:  [facility] G[facility]enterologist:  unassigned    Reason for Consultation:  ileus, Og[place]vie's   HPI: Danny Hampton is a 65 y.o. adult.  PMH below.   DM 2.  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  Ileus after spine surgery in [place] about 2 years ago.  Remote colonoscopy at the [place].  Spinal stenosis, s/p 10/31/2020 spinal surgery, thoracic/sacral fusion.  COVID-positive pneumonia 04/2020.  Post op anemia, Hgb 7.8 (down from 13), s/p 2 PRBCs.    2007 colonoscopy per chart.  No details found.  PSVT, afib/RVR.  Previous alcohol abuse without liver problems.  Stopped drinking in 2020.  Patient went home from spine surgery with about 4 days worth of narcotics.  At home developed abdominal distention and pain which progressed to severe tight, distended abdomen and severe abdominal as well as lower back pain.  The back pain is worse than the abdominal pain.  No nausea or vomiting. Went to [facility] last week and was treated with mag citrate, MiraLAX.  Sent home with prescription for GoLytely.  Had moderate amount of brown stool.  However distention and pain continued so he went back to [facility], ED and was ultimately transferred to [facility] on 9/17. 11/23/2020 CTAP w contrast: Mildly dilated SB with AF levels, concerning for ileus/enteritis.  No clear transition point but partial SBO not excluded.AFLs and distention in proximal colon.  Distal colon relatively decompressed, again no definite point of transition.  Sigmoid diverticulosis.  Small density in left liver lobe, likely cyst or hemangioma.  Biliary tree not dilated.  No gallstones.  Pancreas, spleen unremarkable.   Stomach unremarkable Abdominal xrays 9/18, 9/19: colonic ileus  Labs notable for hyponatremia 124, now 130.  Urine sodium <10.  Mild AKI.  Potassium normal.  Calcium low at 7.6 now 8.6.  Elevated transaminases at 393/77 with normal T bili, normal alk phos.  Note that LFTs at initial presentation were all normal.  CRP, lactic acid elevated. WBCs 14.9 >> 10.  Hgb 10.  Platelets 77.     Since admission diagnosed  w MSSA bacteremia, spinal wound infection.  New onset A. fib, RVR.  Cardiology attributes this to acute illness.  In terms of analgesics, receiving Dilaudid, 6 mg yesterday, 4 mg thus far today.  NGT and rectal tube placed.  Rectal tube was removed as it did not seem to provide any benefit.  Output from NG tube was 100 mL 2 d ago, partial day, and 600 mL yesterday.  Not a lot of NG tube output today.  Has managed to have liquid stools, 700 mL, 825 mL in previous 2 days.  None today.  Not passing much flatus.  ReGglan 10 IV q 12 h initiated on Sunday.   Still having significant back and abdominal pain.  Pain continues to be diffuse. Tacky into the 160s today associated with severe pain.  Hypertensive also associated with severe pain.  No fever.  Sats in mid 90s on 4 L of oxygen.  Past Medical History:  Diagnosis Date   Acid reflux    Anxiety    Arrhythmia    paroxysmal supreventricular tachycardia   Arthritis    Complication of anesthesia    Myotonia congenita   Dysrhythmia    Essential hypertension    Family history of adverse reaction to anesthesia    Sister   High cholesterol    History of skin cancer    Myotonia congenita    Type 2 diabetes mellitus ([place])     Past Surgical History:  Procedure Laterality Date   BACK SURGERY     times 2   CARPAL TUNNEL RELEASE     CATARACT EXTRACTION W/PHACO Left 01/31/2019   Procedure: CATARACT EXTRACTION PHACO AND INTRAOCULAR LENS PLACEMENT ([place]);  Surgeon: Baruch Goldmann, MD;  Location: [facility];  Service: Ophthalmology;  Laterality: Left;   CDE: 2.66   NOSE SURGERY     TOTAL KNEE ARTHROPLASTY Right 10/12/2018   Procedure: RIGHT TOTAL KNEE ARTHROPLASTY;  Surgeon: Garald Balding, MD;  Location: [facility];  Service: Orthopedics;  Laterality: Right;    Prior to Admission medications   Medication Sig Start Date End Date Taking? Authorizing Provider  aspirin 81 MG chewable tablet Chew 1 tablet (81 mg total) by mouth daily. 04/26/20  Yes Barton Dubois, MD  atorvastatin (LIPITOR) 80 MG tablet Take 40 mg by mouth daily.    Yes [provider]  Carbidopa-Levodopa ER 48.75-195 MG CPCR Take 1 tablet by mouth 4 (four) times daily.   Yes [provider]  Cholecalciferol (VITAMIN D3) 125 MCG (5000 UT) TABS Take 5,000 Units by mouth daily.    Yes [provider]  cyclobenzaprine (FLEXERIL) 10 MG tablet Take 1 tablet (10 mg total) by mouth 3 (three) times daily as needed for muscle spasms. 11/03/20  Yes Judith Part, MD  docusate sodium (COLACE) 100 MG capsule Take 100 mg by mouth daily.   Yes [provider]  hydrOXYzine (ATARAX/VISTARIL) 25 MG tablet Take 25 mg by mouth in the morning, at noon, in the evening, and at bedtime.   Yes [provider]  lisinopril-hydrochlorothiazide (ZESTORETIC) 20-25 MG tablet Take 1 tablet by mouth daily.   Yes [provider]  loratadine (CLARITIN) 10 MG tablet Take 10 mg by mouth daily.   Yes [provider]  LORazepam (ATIVAN) 1 MG tablet Take 1.5 tablets by mouth in the morning, at noon, and at bedtime. 07/18/20  Yes [provider]  meloxicam (MOBIC) 15 MG tablet Take 15 mg by mouth daily.   Yes [provider]  metFORMIN (GLUCOPHAGE) 500 MG tablet Take 500 mg by mouth 2 (two) times daily with a meal.    Yes [provider]  metoprolol succinate (TOPROL XL) 25 MG 24 hr tablet Take 1 tablet (25 mg total) by mouth daily. 02/01/19  Yes Satira Sark, MD  Multiple Vitamin (MULTI-VITAMIN) tablet Take 1 tablet by  mouth daily.   Yes [provider]  pantoprazole (PROTONIX) 40 MG tablet Take 40 mg by mouth daily.   Yes [provider]  PARoxetine (PAXIL) 40 MG tablet Take 20-40 mg by mouth See admin instructions. Take 40 mg by mouth in the morning and 20 mg in the evening   Yes [provider]  phenytoin (DILANTIN) 100 MG ER capsule Take 100 mg by mouth 3 (three) times daily.   Yes [provider]  rOPINIRole (  REQUIP) 1 MG tablet Take 0.45m at 6pm and 136mat 9pm. Patient taking differently: Take 0.5 mg by mouth See admin instructions. Take 0.5 mg by mouth every evening and 6 pm and again at 9 pm 03/21/19  Yes Patel, Donika K, DO  sodium chloride (MURO 128) 5 % ophthalmic solution Place 1 drop into the right eye every 3 (three) hours as needed for eye irritation.   Yes [provider]  traMADol (ULTRAM) 50 MG tablet Take by mouth. 11/23/20 11/28/20 Yes [provider]  zinc sulfate 220 (50 Zn) MG capsule Take 1 capsule (220 mg total) by mouth daily. 04/26/20  Yes MaBarton DuboisMD  albuterol (VENTOLIN HFA) 108 (90 Base) MCG/ACT inhaler Inhale 2 puffs into the lungs every 6 (six) hours as needed for wheezing or shortness of breath. Patient not taking: Reported on 11/23/2020 04/26/20   MaBarton DuboisMD  ascorbic acid (VITAMIN C) 500 MG tablet Take 1 tablet (500 mg total) by mouth daily. Patient not taking: Reported on 10/16/2020 04/26/20   MaBarton DuboisMD  guaiFENesin-dextromethorphan ([facility]) 100-10 MG/5ML syrup Take 10 mLs by mouth every 6 (six) hours as needed for cough. Patient not taking: Reported on 11/23/2020 04/26/20   MaBarton DuboisMD  methocarbamol (ROBAXIN) 500 MG tablet Take by mouth. Patient not taking: No sig reported 11/23/20 12/03/20  [provider]  oxyCODONE (OXY IR/ROXICODONE) 5 MG immediate release tablet Take 1 tablet (5 mg total) by mouth every 3 (three) hours as needed (pain). Patient not taking: No sig reported 11/03/20    OsJudith PartMD    Scheduled Meds:  aspirin  81 mg Oral Daily   Carbidopa-Levodopa ER  1 tablet Oral QID   Chlorhexidine Gluconate Cloth  6 each Topical Daily   diltiazem  10 mg Intravenous Once   heparin  5,000 Units Subcutaneous Q8H   insulin aspart  0-9 Units Subcutaneous Q4H   loratadine  10 mg Oral Daily   metoCLOPramide (REGLAN) injection  10 mg Intravenous Q12H   pantoprazole (PROTONIX) IV  40 mg Intravenous Q24H   PARoxetine  40 mg Oral Daily   And   PARoxetine  20 mg Oral QHS   phenytoin (DILANTIN) IV  100 mg Intravenous Q8H   Infusions:  0.9 % NaCl with KCl 20 mEq / L 70 mL/hr at 11/27/20 063614  ceFAZolin (ANCEF) IV 2 g (11/27/20 0451)   diltiazem (CARDIZEM) infusion 20 mg/hr (11/27/20 0909)   PRN Meds: acetaminophen **OR** acetaminophen, albuterol, HYDROmorphone (DILAUDID) injection, hydrOXYzine, LORazepam, ondansetron **OR** ondansetron (ZOFRAN) IV, oxyCODONE   Allergies as of 11/23/2020   (No Known Allergies)    Family History  Problem Relation Age of Onset   Healthy Mother    Healthy Father    Cancer Father     Social History   Socioeconomic History   Marital status: Divorced    Spouse name: Not on file   Number of children: 0   Years of education: Not on file   Highest education level: Not on file  Occupational History   Occupation: retired elClinical biochemistTobacco Use   Smoking status: Former    Packs/day: 1.00    Years: 30.00    Pack years: 30.00    Types: Cigarettes    Quit date: 03/2018    Years since quitting: 2.7   Smokeless tobacco: Never  Vaping Use   Vaping Use: Never used  Substance and Sexual Activity   Alcohol use: Not Currently  Comment: occasional drink   Drug use: No   Sexual activity: Not Currently  Other Topics Concern   Not on file  Social History Narrative   Right handed   Social Determinants of Health   Financial Resource Strain: Not on file  Food Insecurity: Not on file  Transportation Needs: Not on  file  Physical Activity: Not on file  Stress: Not on file  Social Connections: Not on file  Intimate Partner Violence: Not on file    REVIEW OF SYSTEMS: Constitutional: Some weakness.  No profound fatigue. ENT:  No nose bleeds Pulm: No shortness of breath.  Deep breathing causes worsening of back and abdominal pain.  No productive cough. CV:  No palpitations, no LE edema.  Angina. GU:  No hematuria, no frequency GI: See HPI. Heme: [place] unusual or excessive bleeding or bruising. Transfusions: see HPI Neuro:  No seizures, no headaches. Derm:  No itching, no rash or sores.  Endocrine:  No sweats or chills.  No polyuria or dysuria Immunization: Reviewed. Travel:  None beyond local counties in last few months.    PHYSICAL EXAM: Vital signs in last 24 hours: Vitals:   11/27/20 0600 11/27/20 0821  BP: (!) 121/91 114/79  Pulse: (!) 131 (!) 155  Resp: 13 20  Temp: 98.1 F (36.7 C) 98 F (36.7 C)  SpO2: 94% 93%   Wt Readings from Last 3 Encounters:  11/27/20 94.6 kg  10/31/20 90.7 kg  10/22/20 92.8 kg    General: looks ill, uncomfortable Head:  no signs of trauma, no swelling, no asymmetry  Eyes:  no conj pallor or icterus Ears:  slightly HOH  Nose:  no discharge or congestion.  Mouth:   fair dentition.  Tongue midline.  Mucosa clear Neck: No JVD. Lungs: No labored breathing or cough.  Wheezes bilaterally. Heart: Tachy, irregular. Abdomen: Very tight and distended.  Diffusely tender to light touch.  Scant bowel sounds not tinkling or tympanitic.    Rectal: Deferred Musc/Skeltl: No joint redness or swelling. Extremities: No CCE. Neurologic: Oriented x3.  Moves all 4 limbs.  No tremor.  Strength not tested. Skin: No rash, no sores, no suspicious lesions. Nodes: No cervical adenopathy. Psych: Anxious, agitated at times.  Intake/Output from previous day: 09/19 0701 - 09/20 0700 In: 118.7 [I.V.:18.7; IV Piggyback:100] Out: 825 [Urine:225; Emesis/NG  output:600] Intake/Output this shift: No intake/output data recorded.  LAB RESULTS: Recent Labs    11/25/20 0306 11/26/20 0454 11/27/20 0348  WBC 6.8 6.4 5.8  HGB 9.6* 9.8* 10.0*  HCT 28.1* 29.5* 28.7*  PLT 104* 83* 77*   BMET Lab Results  Component Value Date   NA 130 (L) 11/27/2020   NA 124 (L) 11/26/2020   NA 124 (L) 11/25/2020   K 4.4 11/27/2020   K 4.5 11/26/2020   K 3.2 (L) 11/25/2020   CL 95 (L) 11/27/2020   CL 91 (L) 11/26/2020   CL 90 (L) 11/25/2020   CO2 21 (L) 11/27/2020   CO2 22 11/26/2020   CO2 25 11/25/2020   GLUCOSE 128 (H) 11/27/2020   GLUCOSE 138 (H) 11/26/2020   GLUCOSE 154 (H) 11/25/2020   BUN 23 11/27/2020   BUN 25 (H) 11/26/2020   BUN 14 11/25/2020   CREATININE 1.05 11/27/2020   CREATININE 1.36 (H) 11/26/2020   CREATININE 0.79 11/25/2020   CALCIUM 8.6 (L) 11/27/2020   CALCIUM 7.6 (L) 11/26/2020   CALCIUM 7.4 (L) 11/25/2020   LFT Recent Labs    11/25/20 0306 11/26/20 0454  11/27/20 0348  PROT 5.6* 6.4* 5.9*  ALBUMIN 2.7* 2.8* 2.5*  AST 41 330* 393*  ALT 9 13 77*  ALKPHOS 60 55 50  BILITOT 0.6 0.8 1.0   PT/INR Lab Results  Component Value Date   INR 0.9 10/07/2018   Hepatitis Panel No results for input(s): HEPBSAG, HCVAB, HEPAIGM, HEPBIGM in the last 72 hours. C-Diff No components found for: CDIFF Lipase  No results found for: LIPASE  Drugs of Abuse  No results found for: LABOPIA, COCAINSCRNUR, LABBENZ, AMPHETMU, THCU, LABBARB   RADIOLOGY STUDIES: DG Abd 1 View  Result Date: 11/26/2020 CLINICAL DATA:  Constipation with blood in stool EXAM: ABDOMEN - 1 VIEW COMPARISON:  11/25/2020 FINDINGS: Rectal catheter is in place. Nasogastric tube is in place. There is gas within small and large bowel which could be due to ileus or pseudo-obstruction of the elderly. Extensive previous spinal fusion with multiple spinal augmentations as seen previously. IMPRESSION: Rectal tube now in place. Intestinal gas consistent with ileus. No other  significant change since yesterday. Electronically Signed   By: Nelson Chimes M.D.   On: 11/26/2020 08:51   DG Abd 1 View  Result Date: 11/25/2020 CLINICAL DATA:  NG tube placement. EXAM: ABDOMEN - 1 VIEW COMPARISON:  Abdominal x-ray 11/25/2020. FINDINGS: Nasogastric tube tip is in the proximal stomach, just beyond the gastroesophageal junction. Again seen is diffuse gaseous distension of the colon. This is slightly decreased when compared to the prior study. Air seen within central nondilated small bowel. The visualized lung bases are clear. Thoracolumbar fusion hardware is again seen. IMPRESSION: 1. Nasogastric tube tip is in the proximal stomach just beyond the gastroesophageal junction. Recommend advancing tube. 2. Gaseous distension of the entire colon is again seen. Degree of distention has slightly improved. Continued follow-up recommended. Electronically Signed   By: Ronney Asters M.D.   On: 11/25/2020 21:09   MR CERVICAL SPINE WO CONTRAST MR THORACIC SPINE W WO CONTRAST MR Lumbar Spine W Wo Contrast  Result Date: 11/26/2020 CLINICAL DATA:  Spinal fusion, thoracic, follow up Epidural abscess; Spinal fusion, lumbosacral, follow up EXAM: MRI CERVICAL, THORACIC AND LUMBAR SPINE WITHOUT AND WITH CONTRAST TECHNIQUE: Multiplanar and multiecho pulse sequences of the cervical spine, to include the craniocervical junction and cervicothoracic junction, and thoracic and lumbar spine, were obtained without and with intravenous contrast. CONTRAST:  31m GADAVIST GADOBUTROL 1 MMOL/ML IV SOLN COMPARISON:  CT myelogram September 03, 2020. FINDINGS: MRI CERVICAL SPINE FINDINGS Significantly motion limited study.  Within this limitation: Alignment: Similar alignment with reversal of the normal cervical lordosis and grade 1 anterolisthesis of C3 on C4, C4 on C5, and C6 on C7. Vertebrae: Motion limited evaluation. Degenerative/discogenic endplate signal changes at multiple levels. Otherwise, no obvious marrow edema to  suggest acute fracture or discitis/osteomyelitis. Cord: Motion limited evaluation without definite cord signal abnormality. Posterior Fossa, vertebral arteries, paraspinal tissues: Visualized vertebral artery flow voids are maintained. No acute findings in the visualized posterior fossa. There is nonspecific ill-defined edema in the posterior paraspinal soft tissues. Disc levels: C2-C3: No significant disc protrusion, foraminal stenosis, or canal stenosis. C3-C4: Posterior disc osteophyte complex with left greater than right facet and uncovertebral hypertrophy. Resulting moderate to severe left foraminal stenosis and mild canal stenosis. C4-C5: Posterior disc osteophyte complex with right greater than left facet and uncovertebral hypertrophy. Resulting severe right foraminal stenosis and moderate canal stenosis. Flattening of the ventral cord. C5-C6: Posterior disc osteophyte complex with mild bilateral facet and uncovertebral hypertrophy. Mild canal stenosis without significant  foraminal stenosis. C6-C7: Posterior disc osteophyte complex with central inferiorly dissecting disc protrusion versus calcified PLL (as seen on prior myelogram). Resulting moderate canal stenosis. Bilateral facet and uncovertebral hypertrophy with mild to moderate bilateral foraminal stenosis. C7-T1: Posterior disc osteophyte complex with left greater than right facet and uncovertebral hypertrophy. Central disc protrusion with inferior extension. Resulting mild-to-moderate canal stenosis and mild bilateral foraminal stenosis. MRI THORACIC SPINE FINDINGS Alignment:  Normal. Vertebrae: Interval posterior fusion spanning from T10 inferiorly into the lumbar spine. Nonspecific mild edema within the disc spaces at T10-T11, T11-T12, and T12-L1. Cord:  Normal cord signal. Paraspinal and other soft tissues: Postoperative fluid collection in the posterior paraspinal soft tissues in the lower thoracic spine and lumbar spine (detailed further below).  Disc levels: Mild Mild disc bulging at multiple upper to mid thoracic levels. At T7-T8 right paracentral disc protrusion flattens the ventral cord with mild canal stenosis, similar to prior. At T8-T9, disc bulging and endplate spurring results in mild canal stenosis, similar. At T9-T10, evaluation is limited by metallic artifact; however, posterior disc bulging appears to result in mild to moderate canal stenosis, possibly progressed from the prior but poorly characterized. Interval extension of the patient's lumbar fusion superiorly to T10. At T10-T11, the canal appears to be mild to moderately moderately narrowed, similar versus slightly progressed. At T11-T12 the canal appears to be patent. MRI LUMBAR SPINE FINDINGS Motion limited evaluation.  Within this limitation: Segmentation:  Standard. Alignment:  Normal. Vertebrae:  Interval posterior fusion spanning from T10 to S1. Conus medullaris and cauda equina: Conus extends to the L1-L2 level. Conus appears normal. Paraspinal and other soft tissues: Postoperative edema in the paraspinal soft tissues with fluid collection in the paraspinal soft tissues measuring up to 8 x 3 x 63 mm, centered at T12-L1. There is also nonspecific fluid surrounding the posterior elements bilaterally at L2-L3. Disc levels: T12-L1: Posterior fusion. Mild canal stenosis. Limited evaluation of foramina due to metallic artifact. L1-L2: Posterior fusion and decompression. Improved canal stenosis with patent canal. Limited evaluation of the foramina due to metallic artifact with visualized portions appearing patent. L2-L3: Prior posterior decompression and fusion. Osteophytic ridging. Facet hypertrophy. Mild canal stenosis, similar. Limited evaluation of foramina with likely mild bilateral foraminal stenosis. L3-L4: Prior posterior fusion and decompression. Patent canal and foramina. L4-L5: Prior posterior fusion and decompression. Patent canal with probably mild bilateral foraminal stenosis.  L5-S1: Prior posterior fusion and decompression. Patent canal with mild left foraminal stenosis.   MRI cervical spine: 1. Similar moderate canal stenosis at C4-C5 and C6-C7 2. Similar severe foraminal stenosis on the right at C4-C5 and moderate to severe foraminal stenosis on the left at C3-C4. 3. 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 4. Nonspecific ill-defined edema in the posterior paraspinal soft tissues, which could represent strain or cellulitis. MRI thoracic and lumbar spine: 1. Status post extension of lumbar fusion superiorly to T10. Nonspecific postoperative edema and fluid collection in the posterior paraspinal soft tissues centered at T12, which could represent a seroma but abscess is not excluded given reported purulent drainage. There also is nonspecific fluid surrounding the posterior elements at L2-L3. 2. Mild edema within the discs at T10-T11 and T12-L1, which could be degenerative or stress related but early discitis is difficult to exclude given the clinical history. No clearly infectious findings within the canal, although evaluation is limited by metallic artifact and motion. If there is clinical concern for infection, consider  correlation with inflammatory markers and close interval follow-up MRI with contrast as clinically indicated. 3. Improved canal stenosis and foraminal stenosis at L1-L2 status post decompression and fusion. 4. Mild to moderate canal stenosis at T9-T10 and T10-T11 appears similar versus slightly progressed. Similar mild canal stenosis at T7-T8. Electronically Signed   By: Margaretha Sheffield M.D.   On: 11/26/2020 16:39   DG Chest Port 1V same Day  Result Date: 11/27/2020 CLINICAL DATA:  Dyspnea. EXAM: PORTABLE CHEST 1 VIEW COMPARISON:  11/25/2020 FINDINGS: There is a nasogastric tube with tip below the GE junction. Stable cardiomediastinal contours. Low lung volumes  with asymmetric elevation of the right hemidiaphragm. No airspace opacities. No pleural effusion or edema. IMPRESSION: Low lung volumes.  No acute findings. Electronically Signed   By: Kerby Moors M.D.   On: 11/27/2020 09:01   ECHOCARDIOGRAM COMPLETE  Result Date: 11/25/2020  IMPRESSIONS  1. Left ventricular ejection fraction, by estimation, is 60 to 65%. The left ventricle has normal function. The left ventricle has no regional wall motion abnormalities. There is mild concentric left ventricular hypertrophy. Left ventricular diastolic parameters were normal.  2. Right ventricular systolic function is mildly reduced. The right ventricular size is moderately enlarged. Tricuspid regurgitation signal is inadequate for assessing PA pressure.  3. The mitral valve is grossly normal. Trivial mitral valve regurgitation. No evidence of mitral stenosis.  4. The aortic valve is tricuspid. Aortic valve regurgitation is not visualized. No aortic stenosis is present.  5. The inferior vena cava is normal in size with <50% respiratory variability, suggesting right atrial pressure of 8 mmHg. Comparison(s): Changes from prior study are noted. LV function remains unchanged. RV appears moderately dilated with mildly reduced function which is new from prior study. Conclusion(s)/Recommendation(s): No evidence of valvular vegetations on this transthoracic echocardiogram. Would recommend a transesophageal echocardiogram to exclude infective endocarditis if clinically indicated.  Electronically signed by Eleonore Chiquito MD Signature Date/Time: 11/25/2020/3:42:15 PM    Final      IMPRESSION:     Colonic >> SB ileus in pt developing after spine surgery.  Normally ileus is not a painful condition and he is in extreme pain currently.  No evidence for ischemia on 9/16 CTAP w contrast.    [place] anemia    MSSA bacteremia. Spinal wound infection.  Spinal surgery, hardware placement 8/24.    On Cefazolin.  Question evolving sepsis with  tachycardia?    Hyponatremia, improved.  Urine Na <10.      Acutely elevated LFTs, also noted in Feb 2022.  ? Shock liver?  CTAP last week shows TS TC hypodensity in left lobe liver, possibly small cyst or hemangioma.  Otherwise normal liver, pancreas, spleen and biliary tree.  Patient says he quit drinking about 2 years ago but had a history of heavy alcohol use/abuse.   New afib/RVR.      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      Thrombocytopenia.     PLAN:       Limit narcotics as possible.  Awaiting report from repeat portable abdominal film from a couple of hours ago ultrasound abdomen.   Given severity of pain may need to repeat the CT scan.   LFTs, lipase.     Azucena Freed  11/27/2020, 9:17 AM Phone [phone]   Patient seen by Azucena Freed, PA-C.  He is in the operating room now.  I think his ileus could be related to his spinal surgery and abscess i.e. neuropathic or neurogenic.  We  will follow-up tomorrow after he has had his repeat spine surgery, lumbar debridement and reassess.  Agree with surgery that CT abdomen and pelvis may be needed.  Elevated transaminases could be from his soft tissues and the epidural abscess.  Gatha Mayer, MD, [facility] 11/27/2020 4:41 PM

## 2020-11-27 NOTE — Progress Notes (Signed)
PT Cancellation Note  Patient Details Name: Danny Hampton MRN: [id number] DOB: 02-08-1956   Cancelled Treatment:    Reason Eval/Treat Not Completed: Patient not medically ready. PT eval received, chart reviewed. Noted pt to go for emergent surgical debridement per Dr. Ellene Route. Will follow-up for evaluation at the next available date.    Thelma Comp 11/27/2020, 2:05 PM  Rolinda Roan, PT, DPT Acute Rehabilitation Services Pager: [phone] Office: [phone]

## 2020-11-27 NOTE — Anesthesia Procedure Notes (Signed)
Arterial Line Insertion Start/End9/20/2022 2:44 AM  Patient location: Pre-op. Preanesthetic checklist: patient identified, IV checked, site marked, risks and benefits discussed, surgical consent, monitors and equipment checked, pre-op evaluation, timeout performed and anesthesia consent Lidocaine 1% used for infiltration Left, radial was placed Catheter size: 20 G Hand hygiene performed  and maximum sterile barriers used   Attempts: 1 Procedure performed without using ultrasound guided technique. Following insertion, dressing applied. Post procedure assessment: normal and unchanged

## 2020-11-27 NOTE — Consult Note (Signed)
[facility] Surgery Consult Note  Danny Hampton January 31, 1956  [id number].    Requesting MD: Eleonore Chiquito Chief Complaint/Reason for Consult: ileus  HPI:  Danny Hampton is a 64yo male PMH GERD, HTN, hypontonia congenita, DM, who underwent back surgery 10/31/20 by Dr. Saintclair Halsted. He presented to [facility] hospital 11/23/20 with abdominal pain and constipation. CT scan at that time showed mildly dilated small bowel and some colonic distension with air-fluid levels concerning for ileus. He was admitted to the medical service and Dr. Constance Haw from general surgery at [facility] consulted. NG tube was placed 9/18 when his abdominal pain and distension worsened. Red rubber inserted rectally for decompression due to colonic distension and question of possible Ogilvie's, but this fell out. Repeat abdominal film obtained and shows gas in the small and large bowel.  Today he states that he continues to have significant back and abdominal pain. He feels bloated. Passing small amount of flatus. He had a large semisolid BM yesterday. NG tube in place with 600cc output last 24 hours.  Patient was also noted to have purulent drainage from thoracic incision and blood cultures positive for MSSA. He was transferred to [facility] to be seen by neurosurgery. Infectious disease will also follow. He also developed new onset A Fib RVR and cardiology is seeing.  Abdominal surgical history: none Anticoagulants: none Ambulates with walker PRN  ROS: Review of Systems  Respiratory:  Positive for shortness of breath.   Cardiovascular:  Negative for chest pain.  Gastrointestinal:  Positive for abdominal pain.  Musculoskeletal:  Positive for back pain.   All systems reviewed and otherwise negative except for as above  Family History  Problem Relation Age of Onset   Healthy Mother    Healthy Father    Cancer Father     Past Medical History:  Diagnosis Date   Acid reflux    Anxiety    Arrhythmia     paroxysmal supreventricular tachycardia   Arthritis    Complication of anesthesia    Myotonia congenita   Dysrhythmia    Essential hypertension    Family history of adverse reaction to anesthesia    Sister   High cholesterol    History of skin cancer    Myotonia congenita    Type 2 diabetes mellitus ([place])     Past Surgical History:  Procedure Laterality Date   BACK SURGERY     times 2   CARPAL TUNNEL RELEASE     CATARACT EXTRACTION W/PHACO Left 01/31/2019   Procedure: CATARACT EXTRACTION PHACO AND INTRAOCULAR LENS PLACEMENT (IOC);  Surgeon: Baruch Goldmann, MD;  Location: [facility]S;  Service: Ophthalmology;  Laterality: Left;  CDE: 2.66   NOSE SURGERY     TOTAL KNEE ARTHROPLASTY Right 10/12/2018   Procedure: RIGHT TOTAL KNEE ARTHROPLASTY;  Surgeon: Garald Balding, MD;  Location: [facility];  Service: Orthopedics;  Laterality: Right;    Social History:  reports that he quit smoking about 2 years ago. His smoking use included cigarettes. He has a 30.00 pack-year smoking history. He has never used smokeless tobacco. He reports that he does not currently use alcohol. He reports that he does not use drugs.  Allergies: No Known Allergies  Medications Prior to Admission  Medication Sig Dispense Refill   aspirin 81 MG chewable tablet Chew 1 tablet (81 mg total) by mouth daily.     atorvastatin (LIPITOR) 80 MG tablet Take 40 mg by mouth daily.      Carbidopa-Levodopa ER 48.75-195  MG CPCR Take 1 tablet by mouth 4 (four) times daily.     Cholecalciferol (VITAMIN D3) 125 MCG (5000 UT) TABS Take 5,000 Units by mouth daily.      cyclobenzaprine (FLEXERIL) 10 MG tablet Take 1 tablet (10 mg total) by mouth 3 (three) times daily as needed for muscle spasms. 30 tablet 0   docusate sodium (COLACE) 100 MG capsule Take 100 mg by mouth daily.     hydrOXYzine (ATARAX/VISTARIL) 25 MG tablet Take 25 mg by mouth in the morning, at noon, in the evening, and at bedtime.  0   lisinopril-hydrochlorothiazide  (ZESTORETIC) 20-25 MG tablet Take 1 tablet by mouth daily.     loratadine (CLARITIN) 10 MG tablet Take 10 mg by mouth daily.     LORazepam (ATIVAN) 1 MG tablet Take 1.5 tablets by mouth in the morning, at noon, and at bedtime.     meloxicam (MOBIC) 15 MG tablet Take 15 mg by mouth daily.     metFORMIN (GLUCOPHAGE) 500 MG tablet Take 500 mg by mouth 2 (two) times daily with a meal.      metoprolol succinate (TOPROL XL) 25 MG 24 hr tablet Take 1 tablet (25 mg total) by mouth daily. 90 tablet 2   Multiple Vitamin (MULTI-VITAMIN) tablet Take 1 tablet by mouth daily.     pantoprazole (PROTONIX) 40 MG tablet Take 40 mg by mouth daily.     PARoxetine (PAXIL) 40 MG tablet Take 20-40 mg by mouth See admin instructions. Take 40 mg by mouth in the morning and 20 mg in the evening     phenytoin (DILANTIN) 100 MG ER capsule Take 100 mg by mouth 3 (three) times daily.     rOPINIRole (REQUIP) 1 MG tablet Take 0.5mg  at 6pm and 1mg  at 9pm. (Patient taking differently: Take 0.5 mg by mouth See admin instructions. Take 0.5 mg by mouth every evening and 6 pm and again at 9 pm) 135 tablet 3   sodium chloride (MURO 128) 5 % ophthalmic solution Place 1 drop into the right eye every 3 (three) hours as needed for eye irritation.     traMADol (ULTRAM) 50 MG tablet Take by mouth.     zinc sulfate 220 (50 Zn) MG capsule Take 1 capsule (220 mg total) by mouth daily. 30 capsule 3   albuterol (VENTOLIN HFA) 108 (90 Base) MCG/ACT inhaler Inhale 2 puffs into the lungs every 6 (six) hours as needed for wheezing or shortness of breath. (Patient not taking: Reported on 11/23/2020) 8 g 1   ascorbic acid (VITAMIN C) 500 MG tablet Take 1 tablet (500 mg total) by mouth daily. (Patient not taking: Reported on 10/16/2020) 30 tablet 1   guaiFENesin-dextromethorphan (ROBITUSSIN DM) 100-10 MG/5ML syrup Take 10 mLs by mouth every 6 (six) hours as needed for cough. (Patient not taking: Reported on 11/23/2020) 236 mL 0   methocarbamol (ROBAXIN) 500  MG tablet Take by mouth. (Patient not taking: No sig reported)     oxyCODONE (OXY IR/ROXICODONE) 5 MG immediate release tablet Take 1 tablet (5 mg total) by mouth every 3 (three) hours as needed (pain). (Patient not taking: No sig reported) 30 tablet 0    Prior to Admission medications   Medication Sig Start Date End Date Taking? Authorizing Provider  aspirin 81 MG chewable tablet Chew 1 tablet (81 mg total) by mouth daily. 04/26/20  Yes Barton Dubois, MD  atorvastatin (LIPITOR) 80 MG tablet Take 40 mg by mouth daily.    Yes [provider]  Carbidopa-Levodopa ER 48.75-195 MG CPCR Take 1 tablet by mouth 4 (four) times daily.   Yes [provider]  Cholecalciferol (VITAMIN D3) 125 MCG (5000 UT) TABS Take 5,000 Units by mouth daily.    Yes [provider]  cyclobenzaprine (FLEXERIL) 10 MG tablet Take 1 tablet (10 mg total) by mouth 3 (three) times daily as needed for muscle spasms. 11/03/20  Yes Judith Part, MD  docusate sodium (COLACE) 100 MG capsule Take 100 mg by mouth daily.   Yes [provider]  hydrOXYzine (ATARAX/VISTARIL) 25 MG tablet Take 25 mg by mouth in the morning, at noon, in the evening, and at bedtime.   Yes [provider]  lisinopril-hydrochlorothiazide (ZESTORETIC) 20-25 MG tablet Take 1 tablet by mouth daily.   Yes [provider]  loratadine (CLARITIN) 10 MG tablet Take 10 mg by mouth daily.   Yes [provider]  LORazepam (ATIVAN) 1 MG tablet Take 1.5 tablets by mouth in the morning, at noon, and at bedtime. 07/18/20  Yes [provider]  meloxicam (MOBIC) 15 MG tablet Take 15 mg by mouth daily.   Yes [provider]  metFORMIN (GLUCOPHAGE) 500 MG tablet Take 500 mg by mouth 2 (two) times daily with a meal.    Yes [provider]  metoprolol succinate (TOPROL XL) 25 MG 24 hr tablet Take 1 tablet (25 mg total) by mouth daily. 02/01/19  Yes Satira Sark, MD  Multiple Vitamin  (MULTI-VITAMIN) tablet Take 1 tablet by mouth daily.   Yes [provider]  pantoprazole (PROTONIX) 40 MG tablet Take 40 mg by mouth daily.   Yes [provider]  PARoxetine (PAXIL) 40 MG tablet Take 20-40 mg by mouth See admin instructions. Take 40 mg by mouth in the morning and 20 mg in the evening   Yes [provider]  phenytoin (DILANTIN) 100 MG ER capsule Take 100 mg by mouth 3 (three) times daily.   Yes [provider]  rOPINIRole (REQUIP) 1 MG tablet Take 0.5mg  at 6pm and 1mg  at 9pm. Patient taking differently: Take 0.5 mg by mouth See admin instructions. Take 0.5 mg by mouth every evening and 6 pm and again at 9 pm 03/21/19  Yes Patel, Donika K, DO  sodium chloride (MURO 128) 5 % ophthalmic solution Place 1 drop into the right eye every 3 (three) hours as needed for eye irritation.   Yes [provider]  traMADol (ULTRAM) 50 MG tablet Take by mouth. 11/23/20 11/28/20 Yes [provider]  zinc sulfate 220 (50 Zn) MG capsule Take 1 capsule (220 mg total) by mouth daily. 04/26/20  Yes Barton Dubois, MD  albuterol (VENTOLIN HFA) 108 (90 Base) MCG/ACT inhaler Inhale 2 puffs into the lungs every 6 (six) hours as needed for wheezing or shortness of breath. Patient not taking: Reported on 11/23/2020 04/26/20   Barton Dubois, MD  ascorbic acid (VITAMIN C) 500 MG tablet Take 1 tablet (500 mg total) by mouth daily. Patient not taking: Reported on 10/16/2020 04/26/20   Barton Dubois, MD  guaiFENesin-dextromethorphan [facility] DM) 100-10 MG/5ML syrup Take 10 mLs by mouth every 6 (six) hours as needed for cough. Patient not taking: Reported on 11/23/2020 04/26/20   Barton Dubois, MD  methocarbamol (ROBAXIN) 500 MG tablet Take by mouth. Patient not taking: No sig reported 11/23/20 12/03/20  [provider]  oxyCODONE (OXY IR/ROXICODONE) 5 MG immediate release tablet Take 1 tablet (5 mg total) by mouth every 3 (  three) hours as needed (pain). Patient  not taking: No sig reported 11/03/20   Judith Part, MD    Blood pressure 114/79, pulse (!) 155, temperature 98 F (36.7 C), temperature source Oral, resp. rate 20, height 5\' 4"  (1.626 m), weight 94.6 kg, SpO2 93 %. Physical Exam: General: pleasant, WD/WN male who is laying in bed in NAD HEENT: head is normocephalic, atraumatic.  Sclera are noninjected.  Pupils equal and round.  Ears and nose without any masses or lesions.  Mouth is pink and moist. Dentition fair Heart: irregular, tachycardic into 140s.  No obvious murmurs, gallops, or rubs noted.  Palpable pedal pulses bilaterally  Lungs: CTAB, no wheezes, rhonchi, or rales noted.  Respiratory effort nonlabored on [place] Abd: distended but mostly soft, diffusely tender with some voluntary guarding, hypoactive BS, no masses, hernias, or organomegaly MS: no BUE/BLE edema, calves soft and nontender Skin: warm and dry with no masses, lesions, or rashes Psych: A&Ox4 with an appropriate affect Neuro: cranial nerves grossly intact, equal strength in BUE/BLE bilaterally, normal speech, thought process intact  Results for orders placed or performed during the hospital encounter of 11/23/20 (from the past 48 hour(s))  Glucose, capillary     Status: Abnormal   Collection Time: 11/25/20 10:59 AM  Result Value Ref Range   Glucose-Capillary 157 (H) 70 - 99 mg/dL    Comment: Glucose reference range applies only to samples taken after fasting for at least 8 hours.  Aerobic Culture w Gram Stain (superficial specimen)     Status: None (Preliminary result)   Collection Time: 11/25/20 11:00 AM   Specimen: Back; Wound  Result Value Ref Range   Specimen Description      BACK Performed at [facility], [address] [place], [place] [address]    Special Requests      Normal Performed at [facility], [address] [place], [place] [address]    Gram Stain NO WBC SEEN FEW GRAM POSITIVE COCCI     Culture      TOO YOUNG TO READ Performed at [facility], [place] [address], [place], [place] [address]    Report Status PENDING   Culture, blood (routine x 2)     Status: None (Preliminary result)   Collection Time: 11/25/20  3:42 PM   Specimen: BLOOD LEFT FOREARM  Result Value Ref Range   Specimen Description      BLOOD LEFT FOREARM BOTTLES DRAWN AEROBIC AND ANAEROBIC   Special Requests Blood Culture adequate volume    Culture      NO GROWTH < 24 HOURS Performed at [facility], [address] [place], [place] [address]    Report Status PENDING   C-reactive protein     Status: Abnormal   Collection Time: 11/25/20  3:51 PM  Result Value Ref Range   CRP 73.8 (H) <1.0 mg/dL    Comment: RESULTS CONFIRMED BY MANUAL DILUTION Performed at [facility], [address] [place], [place] [address]   Sedimentation rate     Status: Abnormal   Collection Time: 11/25/20  3:52 PM  Result Value Ref Range   Sed Rate 88 (H) 0 - 16 mm/hr    Comment: Performed at [facility], [address] [place], [place] [address]  Culture, blood (routine x 2)     Status: None (Preliminary result)   Collection Time: 11/25/20  3:55 PM   Specimen: Right Antecubital; Blood  Result Value Ref Range   Specimen Description  RIGHT ANTECUBITAL BOTTLES DRAWN AEROBIC AND ANAEROBIC   Special Requests Blood Culture adequate volume    Culture      NO GROWTH < 24 HOURS Performed at [facility], [address] [place], [place] [address]    Report Status PENDING   Glucose, capillary     Status: Abnormal   Collection Time: 11/25/20  4:43 PM  Result Value Ref Range   Glucose-Capillary 136 (H) 70 - 99 mg/dL    Comment: Glucose reference range applies only to samples taken after fasting for at least 8 hours.  Glucose, capillary     Status: Abnormal   Collection Time: 11/25/20  9:31 PM  Result Value Ref Range   Glucose-Capillary 154 (H) 70 - 99 mg/dL    Comment: Glucose reference range applies only to samples taken after fasting for at least 8 hours.   Comment 1  Notify RN    Comment 2 Document in Chart   C-reactive protein     Status: Abnormal   Collection Time: 11/26/20  4:54 AM  Result Value Ref Range   CRP 57.5 (H) <1.0 mg/dL    Comment: RESULTS CONFIRMED BY MANUAL DILUTION Performed at [facility], [address] [place], [place] [address]   Sedimentation rate     Status: Abnormal   Collection Time: 11/26/20  4:54 AM  Result Value Ref Range   Sed Rate 73 (H) 0 - 16 mm/hr    Comment: Performed at [facility], [address], [place], [place] [address]  Comprehensive metabolic panel     Status: Abnormal   Collection Time: 11/26/20  4:54 AM  Result Value Ref Range   Sodium 124 (L) 135 - 145 mmol/L   Potassium 4.5 3.5 - 5.1 mmol/L    Comment: DELTA CHECK NOTED   Chloride 91 (L) 98 - 111 mmol/L   CO2 22 22 - 32 mmol/L   Glucose, Bld 138 (H) 70 - 99 mg/dL    Comment: Glucose reference range applies only to samples taken after fasting for at least 8 hours.   BUN 25 (H) 8 - 23 mg/dL   Creatinine, Ser 1.36 (H) 0.61 - 1.24 mg/dL   Calcium 7.6 (L) 8.9 - 10.3 mg/dL   Total Protein 6.4 (L) 6.5 - 8.1 g/dL   Albumin 2.8 (L) 3.5 - 5.0 g/dL   AST 330 (H) 15 - 41 U/L   ALT 13 0 - 44 U/L   Alkaline Phosphatase 55 38 - 126 U/L   Total Bilirubin 0.8 0.3 - 1.2 mg/dL   GFR, Estimated 58 (L) >60 mL/min    Comment: (NOTE) Calculated using the CKD-EPI Creatinine Equation (2021)    Anion gap 11 5 - 15    Comment: Performed at [facility], [address] [place], [place] [address]  Procalcitonin     Status: None   Collection Time: 11/26/20  4:54 AM  Result Value Ref Range   Procalcitonin 3.95 ng/mL    Comment:        Interpretation: PCT > 2 ng/mL: Systemic infection (sepsis) is likely, unless other causes are known. (NOTE)       Sepsis PCT Algorithm           Lower Respiratory Tract                                      Infection PCT Algorithm    ----------------------------     ----------------------------  PCT < 0.25 ng/mL                 PCT < 0.10 ng/mL          Strongly encourage             Strongly discourage   discontinuation of antibiotics    initiation of antibiotics    ----------------------------     -----------------------------       PCT 0.25 - 0.50 ng/mL            PCT 0.10 - 0.25 ng/mL               OR       >80% decrease in PCT            Discourage initiation of                                            antibiotics      Encourage discontinuation           of antibiotics    ----------------------------     -----------------------------         PCT >= 0.50 ng/mL              PCT 0.26 - 0.50 ng/mL               AND       <80% decrease in PCT              Encourage initiation of                                             antibiotics       Encourage continuation           of antibiotics    ----------------------------     -----------------------------        PCT >= 0.50 ng/mL                  PCT > 0.50 ng/mL               AND         increase in PCT                  Strongly encourage                                      initiation of antibiotics    Strongly encourage escalation           of antibiotics                                     -----------------------------                                           PCT <= 0.25 ng/mL  OR                                        > 80% decrease in PCT                                      Discontinue / Do not initiate                                             antibiotics  Performed at [facility], [address], [place], [place] [address]   CBC with Differential/Platelet     Status: Abnormal   Collection Time: 11/26/20  4:54 AM  Result Value Ref Range   WBC 6.4 4.0 - 10.5 K/uL   RBC 3.09 (L) 4.22 - 5.81 MIL/uL   Hemoglobin 9.8 (L) 13.0 - 17.0 g/dL   HCT 29.5 (L) 39.0 - 52.0 %   MCV 95.5 80.0 - 100.0 fL   MCH 31.7 26.0 - 34.0 pg   MCHC 33.2 30.0 - 36.0 g/dL   RDW 12.8 11.5 - 15.5 %   Platelets 83 (L) 150  - 400 K/uL    Comment: SPECIMEN CHECKED FOR CLOTS Immature Platelet Fraction may be clinically indicated, consider ordering this additional test [id number]    nRBC 0.0 0.0 - 0.2 %   Neutrophils Relative % 73 %   Neutro Abs 5.3 1.7 - 7.7 K/uL   Band Neutrophils 10 %   Lymphocytes Relative 10 %   Lymphs Abs 0.6 (L) 0.7 - 4.0 K/uL   Monocytes Relative 4 %   Monocytes Absolute 0.3 0.1 - 1.0 K/uL   Eosinophils Relative 0 %   Eosinophils Absolute 0.0 0.0 - 0.5 K/uL   Basophils Relative 0 %   Basophils Absolute 0.0 0.0 - 0.1 K/uL   Metamyelocytes Relative 2 %   Myelocytes 1 %    Comment: Performed at [facility], [address] [place], [place] [address]  Glucose, capillary     Status: Abnormal   Collection Time: 11/26/20  7:17 AM  Result Value Ref Range   Glucose-Capillary 152 (H) 70 - 99 mg/dL    Comment: Glucose reference range applies only to samples taken after fasting for at least 8 hours.  Glucose, capillary     Status: Abnormal   Collection Time: 11/26/20 11:07 AM  Result Value Ref Range   Glucose-Capillary 125 (H) 70 - 99 mg/dL    Comment: Glucose reference range applies only to samples taken after fasting for at least 8 hours.  Glucose, capillary     Status: Abnormal   Collection Time: 11/26/20  1:20 PM  Result Value Ref Range   Glucose-Capillary 138 (H) 70 - 99 mg/dL    Comment: Glucose reference range applies only to samples taken after fasting for at least 8 hours.  Urinalysis, Routine w reflex microscopic Urine, Clean Catch     Status: Abnormal   Collection Time: 11/26/20  3:31 PM  Result Value Ref Range   Color, Urine YELLOW YELLOW   APPearance CLOUDY (A) CLEAR   Specific Gravity, Urine 1.023 1.005 - 1.030   pH 5.0 5.0 - 8.0   Glucose, UA NEGATIVE NEGATIVE mg/dL   Hgb urine  dipstick MODERATE (A) NEGATIVE   Bilirubin Urine NEGATIVE NEGATIVE   Ketones, ur 20 (A) NEGATIVE mg/dL   Protein, ur 100 (A) NEGATIVE mg/dL   Nitrite NEGATIVE NEGATIVE   Leukocytes,Ua  NEGATIVE NEGATIVE   RBC / HPF 11-20 0 - 5 RBC/hpf   WBC, UA 6-10 0 - 5 WBC/hpf   Bacteria, UA RARE (A) NONE SEEN   Squamous Epithelial / LPF 0-5 0 - 5   Mucus PRESENT    Hyaline Casts, UA PRESENT     Comment: Performed at [facility], [address], [place], [place] [address]  Osmolality, urine     Status: None   Collection Time: 11/26/20  3:31 PM  Result Value Ref Range   Osmolality, Ur 509 300 - 900 mOsm/kg    Comment: Performed at [place] [address] [place], [place] [address]  Sodium, urine, random     Status: None   Collection Time: 11/26/20  3:31 PM  Result Value Ref Range   Sodium, Ur <10 mmol/L    Comment: Performed at [facility], [address], [place], [place] [address]  Chloride, urine, random     Status: None   Collection Time: 11/26/20  3:31 PM  Result Value Ref Range   Chloride Urine <15 mmol/L    Comment: Performed at [facility], [address], [place], [place] [address]  Glucose, capillary     Status: Abnormal   Collection Time: 11/26/20  4:16 PM  Result Value Ref Range   Glucose-Capillary 133 (H) 70 - 99 mg/dL    Comment: Glucose reference range applies only to samples taken after fasting for at least 8 hours.  Glucose, capillary     Status: Abnormal   Collection Time: 11/26/20  8:13 PM  Result Value Ref Range   Glucose-Capillary 113 (H) 70 - 99 mg/dL    Comment: Glucose reference range applies only to samples taken after fasting for at least 8 hours.  Glucose, capillary     Status: Abnormal   Collection Time: 11/27/20 12:00 AM  Result Value Ref Range   Glucose-Capillary 134 (H) 70 - 99 mg/dL    Comment: Glucose reference range applies only to samples taken after fasting for at least 8 hours.  CBC with Differential/Platelet     Status: Abnormal   Collection Time: 11/27/20  3:48 AM  Result Value Ref Range   WBC 5.8 4.0 - 10.5 K/uL   RBC 3.12 (L) 4.22 - 5.81 MIL/uL   Hemoglobin 10.0 (L) 13.0 - 17.0 g/dL   HCT 28.7 (L) 39.0 - 52.0 %    MCV 92.0 80.0 - 100.0 fL   MCH 32.1 26.0 - 34.0 pg   MCHC 34.8 30.0 - 36.0 g/dL   RDW 13.0 11.5 - 15.5 %   Platelets 77 (L) 150 - 400 K/uL    Comment: Immature Platelet Fraction may be clinically indicated, consider ordering this additional test [id number] REPEATED TO VERIFY PLATELET COUNT CONFIRMED BY SMEAR    nRBC 0.0 0.0 - 0.2 %   Neutrophils Relative % 79 %   Neutro Abs 4.6 1.7 - 7.7 K/uL   Lymphocytes Relative 7 %   Lymphs Abs 0.4 (L) 0.7 - 4.0 K/uL   Monocytes Relative 12 %   Monocytes Absolute 0.7 0.1 - 1.0 K/uL   Eosinophils Relative 1 %   Eosinophils Absolute 0.1 0.0 - 0.5 K/uL   Basophils Relative 0 %   Basophils Absolute 0.0 0.0 - 0.1 K/uL   Immature  Granulocytes 1 %   Abs Immature Granulocytes 0.04 0.00 - 0.07 K/uL    Comment: Performed at [facility], [place] [address] [place], [place] [address]  Comprehensive metabolic panel     Status: Abnormal   Collection Time: 11/27/20  3:48 AM  Result Value Ref Range   Sodium 130 (L) 135 - 145 mmol/L   Potassium 4.4 3.5 - 5.1 mmol/L   Chloride 95 (L) 98 - 111 mmol/L   CO2 21 (L) 22 - 32 mmol/L   Glucose, Bld 128 (H) 70 - 99 mg/dL    Comment: Glucose reference range applies only to samples taken after fasting for at least 8 hours.   BUN 23 8 - 23 mg/dL   Creatinine, Ser 1.05 0.61 - 1.24 mg/dL   Calcium 8.6 (L) 8.9 - 10.3 mg/dL   Total Protein 5.9 (L) 6.5 - 8.1 g/dL   Albumin 2.5 (L) 3.5 - 5.0 g/dL   AST 393 (H) 15 - 41 U/L   ALT 77 (H) 0 - 44 U/L   Alkaline Phosphatase 50 38 - 126 U/L   Total Bilirubin 1.0 0.3 - 1.2 mg/dL   GFR, Estimated >60 >60 mL/min    Comment: (NOTE) Calculated using the CKD-EPI Creatinine Equation (2021)    Anion gap 14 5 - 15    Comment: Performed at [facility], [place] [address] [place], [place] [address]  Glucose, capillary     Status: Abnormal   Collection Time: 11/27/20  3:57 AM  Result Value Ref Range   Glucose-Capillary 139 (H) 70 - 99 mg/dL    Comment: Glucose reference  range applies only to samples taken after fasting for at least 8 hours.  Glucose, capillary     Status: Abnormal   Collection Time: 11/27/20  6:40 AM  Result Value Ref Range   Glucose-Capillary 142 (H) 70 - 99 mg/dL    Comment: Glucose reference range applies only to samples taken after fasting for at least 8 hours.  Glucose, capillary     Status: Abnormal   Collection Time: 11/27/20  7:32 AM  Result Value Ref Range   Glucose-Capillary 139 (H) 70 - 99 mg/dL    Comment: Glucose reference range applies only to samples taken after fasting for at least 8 hours.   Comment 1 Notify RN    Comment 2 Document in Chart    DG Abd 1 View  Result Date: 11/26/2020 CLINICAL DATA:  Constipation with blood in stool EXAM: ABDOMEN - 1 VIEW COMPARISON:  11/25/2020 FINDINGS: Rectal catheter is in place. Nasogastric tube is in place. There is gas within small and large bowel which could be due to ileus or pseudo-obstruction of the elderly. Extensive previous spinal fusion with multiple spinal augmentations as seen previously. IMPRESSION: Rectal tube now in place. Intestinal gas consistent with ileus. No other significant change since yesterday. Electronically Signed   By: Nelson Chimes M.D.   On: 11/26/2020 08:51   DG Abd 1 View  Result Date: 11/25/2020 CLINICAL DATA:  NG tube placement. EXAM: ABDOMEN - 1 VIEW COMPARISON:  Abdominal x-ray 11/25/2020. FINDINGS: Nasogastric tube tip is in the proximal stomach, just beyond the gastroesophageal junction. Again seen is diffuse gaseous distension of the colon. This is slightly decreased when compared to the prior study. Air seen within central nondilated small bowel. The visualized lung bases are clear. Thoracolumbar fusion hardware is again seen. IMPRESSION: 1. Nasogastric tube tip is in the proximal stomach just beyond the gastroesophageal junction.  Recommend advancing tube. 2. Gaseous distension of the entire colon is again seen. Degree of distention has slightly  improved. Continued follow-up recommended. Electronically Signed   By: Ronney Asters M.D.   On: 11/25/2020 21:09   MR CERVICAL SPINE WO CONTRAST  Result Date: 11/26/2020 CLINICAL DATA:  Spinal fusion, thoracic, follow up Epidural abscess; Spinal fusion, lumbosacral, follow up EXAM: MRI CERVICAL, THORACIC AND LUMBAR SPINE WITHOUT AND WITH CONTRAST TECHNIQUE: Multiplanar and multiecho pulse sequences of the cervical spine, to include the craniocervical junction and cervicothoracic junction, and thoracic and lumbar spine, were obtained without and with intravenous contrast. CONTRAST:  37mL GADAVIST GADOBUTROL 1 MMOL/ML IV SOLN COMPARISON:  CT myelogram September 03, 2020. FINDINGS: MRI CERVICAL SPINE FINDINGS Significantly motion limited study.  Within this limitation: Alignment: Similar alignment with reversal of the normal cervical lordosis and grade 1 anterolisthesis of C3 on C4, C4 on C5, and C6 on C7. Vertebrae: Motion limited evaluation. Degenerative/discogenic endplate signal changes at multiple levels. Otherwise, no obvious marrow edema to suggest acute fracture or discitis/osteomyelitis. Cord: Motion limited evaluation without definite cord signal abnormality. Posterior Fossa, vertebral arteries, paraspinal tissues: Visualized vertebral artery flow voids are maintained. No acute findings in the visualized posterior fossa. There is nonspecific ill-defined edema in the posterior paraspinal soft tissues. Disc levels: C2-C3: No significant disc protrusion, foraminal stenosis, or canal stenosis. C3-C4: Posterior disc osteophyte complex with left greater than right facet and uncovertebral hypertrophy. Resulting moderate to severe left foraminal stenosis and mild canal stenosis. C4-C5: Posterior disc osteophyte complex with right greater than left facet and uncovertebral hypertrophy. Resulting severe right foraminal stenosis and moderate canal stenosis. Flattening of the ventral cord. C5-C6: Posterior disc osteophyte  complex with mild bilateral facet and uncovertebral hypertrophy. Mild canal stenosis without significant foraminal stenosis. C6-C7: Posterior disc osteophyte complex with central inferiorly dissecting disc protrusion versus calcified PLL (as seen on prior myelogram). Resulting moderate canal stenosis. Bilateral facet and uncovertebral hypertrophy with mild to moderate bilateral foraminal stenosis. C7-T1: Posterior disc osteophyte complex with left greater than right facet and uncovertebral hypertrophy. Central disc protrusion with inferior extension. Resulting mild-to-moderate canal stenosis and mild bilateral foraminal stenosis. MRI THORACIC SPINE FINDINGS Alignment:  Normal. Vertebrae: Interval posterior fusion spanning from T10 inferiorly into the lumbar spine. Nonspecific mild edema within the disc spaces at T10-T11, T11-T12, and T12-L1. Cord:  Normal cord signal. Paraspinal and other soft tissues: Postoperative fluid collection in the posterior paraspinal soft tissues in the lower thoracic spine and lumbar spine (detailed further below). Disc levels: Mild Mild disc bulging at multiple upper to mid thoracic levels. At T7-T8 right paracentral disc protrusion flattens the ventral cord with mild canal stenosis, similar to prior. At T8-T9, disc bulging and endplate spurring results in mild canal stenosis, similar. At T9-T10, evaluation is limited by metallic artifact; however, posterior disc bulging appears to result in mild to moderate canal stenosis, possibly progressed from the prior but poorly characterized. Interval extension of the patient's lumbar fusion superiorly to T10. At T10-T11, the canal appears to be mild to moderately moderately narrowed, similar versus slightly progressed. At T11-T12 the canal appears to be patent. MRI LUMBAR SPINE FINDINGS Motion limited evaluation.  Within this limitation: Segmentation:  Standard. Alignment:  Normal. Vertebrae:  Interval posterior fusion spanning from T10 to S1.  Conus medullaris and cauda equina: Conus extends to the L1-L2 level. Conus appears normal. Paraspinal and other soft tissues: Postoperative edema in the paraspinal soft tissues with fluid collection in the paraspinal soft tissues measuring  up to 8 x 3 x 63 mm, centered at T12-L1. There is also nonspecific fluid surrounding the posterior elements bilaterally at L2-L3. Disc levels: T12-L1: Posterior fusion. Mild canal stenosis. Limited evaluation of foramina due to metallic artifact. L1-L2: Posterior fusion and decompression. Improved canal stenosis with patent canal. Limited evaluation of the foramina due to metallic artifact with visualized portions appearing patent. L2-L3: Prior posterior decompression and fusion. Osteophytic ridging. Facet hypertrophy. Mild canal stenosis, similar. Limited evaluation of foramina with likely mild bilateral foraminal stenosis. L3-L4: Prior posterior fusion and decompression. Patent canal and foramina. L4-L5: Prior posterior fusion and decompression. Patent canal with probably mild bilateral foraminal stenosis. L5-S1: Prior posterior fusion and decompression. Patent canal with mild left foraminal stenosis. IMPRESSION: MRI cervical spine: 1. Similar moderate canal stenosis at C4-C5 and C6-C7 2. Similar severe foraminal stenosis on the right at C4-C5 and moderate to severe foraminal stenosis on the left at C3-C4. 3. 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 4. Nonspecific ill-defined edema in the posterior paraspinal soft tissues, which could represent strain or cellulitis. MRI thoracic and lumbar spine: 1. Status post extension of lumbar fusion superiorly to T10. Nonspecific postoperative edema and fluid collection in the posterior paraspinal soft tissues centered at T12, which could represent a seroma but abscess is not excluded given reported purulent drainage. There also is  nonspecific fluid surrounding the posterior elements at L2-L3. 2. Mild edema within the discs at T10-T11 and T12-L1, which could be degenerative or stress related but early discitis is difficult to exclude given the clinical history. No clearly infectious findings within the canal, although evaluation is limited by metallic artifact and motion. If there is clinical concern for infection, consider correlation with inflammatory markers and close interval follow-up MRI with contrast as clinically indicated. 3. Improved canal stenosis and foraminal stenosis at L1-L2 status post decompression and fusion. 4. Mild to moderate canal stenosis at T9-T10 and T10-T11 appears similar versus slightly progressed. Similar mild canal stenosis at T7-T8. Electronically Signed   By: Margaretha Sheffield M.D.   On: 11/26/2020 16:39   MR THORACIC SPINE W WO CONTRAST  Result Date: 11/26/2020 CLINICAL DATA:  Spinal fusion, thoracic, follow up Epidural abscess; Spinal fusion, lumbosacral, follow up EXAM: MRI CERVICAL, THORACIC AND LUMBAR SPINE WITHOUT AND WITH CONTRAST TECHNIQUE: Multiplanar and multiecho pulse sequences of the cervical spine, to include the craniocervical junction and cervicothoracic junction, and thoracic and lumbar spine, were obtained without and with intravenous contrast. CONTRAST:  38mL GADAVIST GADOBUTROL 1 MMOL/ML IV SOLN COMPARISON:  CT myelogram September 03, 2020. FINDINGS: MRI CERVICAL SPINE FINDINGS Significantly motion limited study.  Within this limitation: Alignment: Similar alignment with reversal of the normal cervical lordosis and grade 1 anterolisthesis of C3 on C4, C4 on C5, and C6 on C7. Vertebrae: Motion limited evaluation. Degenerative/discogenic endplate signal changes at multiple levels. Otherwise, no obvious marrow edema to suggest acute fracture or discitis/osteomyelitis. Cord: Motion limited evaluation without definite cord signal abnormality. Posterior Fossa, vertebral arteries, paraspinal tissues:  Visualized vertebral artery flow voids are maintained. No acute findings in the visualized posterior fossa. There is nonspecific ill-defined edema in the posterior paraspinal soft tissues. Disc levels: C2-C3: No significant disc protrusion, foraminal stenosis, or canal stenosis. C3-C4: Posterior disc osteophyte complex with left greater than right facet and uncovertebral hypertrophy. Resulting moderate to severe left foraminal stenosis and mild canal stenosis. C4-C5: Posterior disc osteophyte complex with right greater than left facet  and uncovertebral hypertrophy. Resulting severe right foraminal stenosis and moderate canal stenosis. Flattening of the ventral cord. C5-C6: Posterior disc osteophyte complex with mild bilateral facet and uncovertebral hypertrophy. Mild canal stenosis without significant foraminal stenosis. C6-C7: Posterior disc osteophyte complex with central inferiorly dissecting disc protrusion versus calcified PLL (as seen on prior myelogram). Resulting moderate canal stenosis. Bilateral facet and uncovertebral hypertrophy with mild to moderate bilateral foraminal stenosis. C7-T1: Posterior disc osteophyte complex with left greater than right facet and uncovertebral hypertrophy. Central disc protrusion with inferior extension. Resulting mild-to-moderate canal stenosis and mild bilateral foraminal stenosis. MRI THORACIC SPINE FINDINGS Alignment:  Normal. Vertebrae: Interval posterior fusion spanning from T10 inferiorly into the lumbar spine. Nonspecific mild edema within the disc spaces at T10-T11, T11-T12, and T12-L1. Cord:  Normal cord signal. Paraspinal and other soft tissues: Postoperative fluid collection in the posterior paraspinal soft tissues in the lower thoracic spine and lumbar spine (detailed further below). Disc levels: Mild Mild disc bulging at multiple upper to mid thoracic levels. At T7-T8 right paracentral disc protrusion flattens the ventral cord with mild canal stenosis, similar  to prior. At T8-T9, disc bulging and endplate spurring results in mild canal stenosis, similar. At T9-T10, evaluation is limited by metallic artifact; however, posterior disc bulging appears to result in mild to moderate canal stenosis, possibly progressed from the prior but poorly characterized. Interval extension of the patient's lumbar fusion superiorly to T10. At T10-T11, the canal appears to be mild to moderately moderately narrowed, similar versus slightly progressed. At T11-T12 the canal appears to be patent. MRI LUMBAR SPINE FINDINGS Motion limited evaluation.  Within this limitation: Segmentation:  Standard. Alignment:  Normal. Vertebrae:  Interval posterior fusion spanning from T10 to S1. Conus medullaris and cauda equina: Conus extends to the L1-L2 level. Conus appears normal. Paraspinal and other soft tissues: Postoperative edema in the paraspinal soft tissues with fluid collection in the paraspinal soft tissues measuring up to 8 x 3 x 63 mm, centered at T12-L1. There is also nonspecific fluid surrounding the posterior elements bilaterally at L2-L3. Disc levels: T12-L1: Posterior fusion. Mild canal stenosis. Limited evaluation of foramina due to metallic artifact. L1-L2: Posterior fusion and decompression. Improved canal stenosis with patent canal. Limited evaluation of the foramina due to metallic artifact with visualized portions appearing patent. L2-L3: Prior posterior decompression and fusion. Osteophytic ridging. Facet hypertrophy. Mild canal stenosis, similar. Limited evaluation of foramina with likely mild bilateral foraminal stenosis. L3-L4: Prior posterior fusion and decompression. Patent canal and foramina. L4-L5: Prior posterior fusion and decompression. Patent canal with probably mild bilateral foraminal stenosis. L5-S1: Prior posterior fusion and decompression. Patent canal with mild left foraminal stenosis. IMPRESSION: MRI cervical spine: 1. Similar moderate canal stenosis at C4-C5 and  C6-C7 2. Similar severe foraminal stenosis on the right at C4-C5 and moderate to severe foraminal stenosis on the left at C3-C4. 3. 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 4. Nonspecific ill-defined edema in the posterior paraspinal soft tissues, which could represent strain or cellulitis. MRI thoracic and lumbar spine: 1. Status post extension of lumbar fusion superiorly to T10. Nonspecific postoperative edema and fluid collection in the posterior paraspinal soft tissues centered at T12, which could represent a seroma but abscess is not excluded given reported purulent drainage. There also is nonspecific fluid surrounding the posterior elements at L2-L3. 2. Mild edema within the discs at T10-T11 and T12-L1, which could be degenerative or stress related but early  discitis is difficult to exclude given the clinical history. No clearly infectious findings within the canal, although evaluation is limited by metallic artifact and motion. If there is clinical concern for infection, consider correlation with inflammatory markers and close interval follow-up MRI with contrast as clinically indicated. 3. Improved canal stenosis and foraminal stenosis at L1-L2 status post decompression and fusion. 4. Mild to moderate canal stenosis at T9-T10 and T10-T11 appears similar versus slightly progressed. Similar mild canal stenosis at T7-T8. Electronically Signed   By: Margaretha Sheffield M.D.   On: 11/26/2020 16:39   MR Lumbar Spine W Wo Contrast  Result Date: 11/26/2020 CLINICAL DATA:  Spinal fusion, thoracic, follow up Epidural abscess; Spinal fusion, lumbosacral, follow up EXAM: MRI CERVICAL, THORACIC AND LUMBAR SPINE WITHOUT AND WITH CONTRAST TECHNIQUE: Multiplanar and multiecho pulse sequences of the cervical spine, to include the craniocervical junction and cervicothoracic junction, and thoracic and lumbar spine, were  obtained without and with intravenous contrast. CONTRAST:  86mL GADAVIST GADOBUTROL 1 MMOL/ML IV SOLN COMPARISON:  CT myelogram September 03, 2020. FINDINGS: MRI CERVICAL SPINE FINDINGS Significantly motion limited study.  Within this limitation: Alignment: Similar alignment with reversal of the normal cervical lordosis and grade 1 anterolisthesis of C3 on C4, C4 on C5, and C6 on C7. Vertebrae: Motion limited evaluation. Degenerative/discogenic endplate signal changes at multiple levels. Otherwise, no obvious marrow edema to suggest acute fracture or discitis/osteomyelitis. Cord: Motion limited evaluation without definite cord signal abnormality. Posterior Fossa, vertebral arteries, paraspinal tissues: Visualized vertebral artery flow voids are maintained. No acute findings in the visualized posterior fossa. There is nonspecific ill-defined edema in the posterior paraspinal soft tissues. Disc levels: C2-C3: No significant disc protrusion, foraminal stenosis, or canal stenosis. C3-C4: Posterior disc osteophyte complex with left greater than right facet and uncovertebral hypertrophy. Resulting moderate to severe left foraminal stenosis and mild canal stenosis. C4-C5: Posterior disc osteophyte complex with right greater than left facet and uncovertebral hypertrophy. Resulting severe right foraminal stenosis and moderate canal stenosis. Flattening of the ventral cord. C5-C6: Posterior disc osteophyte complex with mild bilateral facet and uncovertebral hypertrophy. Mild canal stenosis without significant foraminal stenosis. C6-C7: Posterior disc osteophyte complex with central inferiorly dissecting disc protrusion versus calcified PLL (as seen on prior myelogram). Resulting moderate canal stenosis. Bilateral facet and uncovertebral hypertrophy with mild to moderate bilateral foraminal stenosis. C7-T1: Posterior disc osteophyte complex with left greater than right facet and uncovertebral hypertrophy. Central disc protrusion  with inferior extension. Resulting mild-to-moderate canal stenosis and mild bilateral foraminal stenosis. MRI THORACIC SPINE FINDINGS Alignment:  Normal. Vertebrae: Interval posterior fusion spanning from T10 inferiorly into the lumbar spine. Nonspecific mild edema within the disc spaces at T10-T11, T11-T12, and T12-L1. Cord:  Normal cord signal. Paraspinal and other soft tissues: Postoperative fluid collection in the posterior paraspinal soft tissues in the lower thoracic spine and lumbar spine (detailed further below). Disc levels: Mild Mild disc bulging at multiple upper to mid thoracic levels. At T7-T8 right paracentral disc protrusion flattens the ventral cord with mild canal stenosis, similar to prior. At T8-T9, disc bulging and endplate spurring results in mild canal stenosis, similar. At T9-T10, evaluation is limited by metallic artifact; however, posterior disc bulging appears to result in mild to moderate canal stenosis, possibly progressed from the prior but poorly characterized. Interval extension of the patient's lumbar fusion superiorly to T10. At T10-T11, the canal appears to be mild to moderately moderately narrowed, similar versus slightly progressed. At T11-T12 the canal appears to be patent. MRI LUMBAR  SPINE FINDINGS Motion limited evaluation.  Within this limitation: Segmentation:  Standard. Alignment:  Normal. Vertebrae:  Interval posterior fusion spanning from T10 to S1. Conus medullaris and cauda equina: Conus extends to the L1-L2 level. Conus appears normal. Paraspinal and other soft tissues: Postoperative edema in the paraspinal soft tissues with fluid collection in the paraspinal soft tissues measuring up to 8 x 3 x 63 mm, centered at T12-L1. There is also nonspecific fluid surrounding the posterior elements bilaterally at L2-L3. Disc levels: T12-L1: Posterior fusion. Mild canal stenosis. Limited evaluation of foramina due to metallic artifact. L1-L2: Posterior fusion and decompression.  Improved canal stenosis with patent canal. Limited evaluation of the foramina due to metallic artifact with visualized portions appearing patent. L2-L3: Prior posterior decompression and fusion. Osteophytic ridging. Facet hypertrophy. Mild canal stenosis, similar. Limited evaluation of foramina with likely mild bilateral foraminal stenosis. L3-L4: Prior posterior fusion and decompression. Patent canal and foramina. L4-L5: Prior posterior fusion and decompression. Patent canal with probably mild bilateral foraminal stenosis. L5-S1: Prior posterior fusion and decompression. Patent canal with mild left foraminal stenosis. IMPRESSION: MRI cervical spine: 1. Similar moderate canal stenosis at C4-C5 and C6-C7 2. Similar severe foraminal stenosis on the right at C4-C5 and moderate to severe foraminal stenosis on the left at C3-C4. 3. 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 4. Nonspecific ill-defined edema in the posterior paraspinal soft tissues, which could represent strain or cellulitis. MRI thoracic and lumbar spine: 1. Status post extension of lumbar fusion superiorly to T10. Nonspecific postoperative edema and fluid collection in the posterior paraspinal soft tissues centered at T12, which could represent a seroma but abscess is not excluded given reported purulent drainage. There also is nonspecific fluid surrounding the posterior elements at L2-L3. 2. Mild edema within the discs at T10-T11 and T12-L1, which could be degenerative or stress related but early discitis is difficult to exclude given the clinical history. No clearly infectious findings within the canal, although evaluation is limited by metallic artifact and motion. If there is clinical concern for infection, consider correlation with inflammatory markers and close interval follow-up MRI with contrast as clinically indicated. 3. Improved canal stenosis  and foraminal stenosis at L1-L2 status post decompression and fusion. 4. Mild to moderate canal stenosis at T9-T10 and T10-T11 appears similar versus slightly progressed. Similar mild canal stenosis at T7-T8. Electronically Signed   By: Margaretha Sheffield M.D.   On: 11/26/2020 16:39   DG Chest Port 1V same Day  Result Date: 11/27/2020 CLINICAL DATA:  Dyspnea. EXAM: PORTABLE CHEST 1 VIEW COMPARISON:  11/25/2020 FINDINGS: There is a nasogastric tube with tip below the GE junction. Stable cardiomediastinal contours. Low lung volumes with asymmetric elevation of the right hemidiaphragm. No airspace opacities. No pleural effusion or edema. IMPRESSION: Low lung volumes.  No acute findings. Electronically Signed   By: Kerby Moors M.D.   On: 11/27/2020 09:01   ECHOCARDIOGRAM COMPLETE  Result Date: 11/25/2020    ECHOCARDIOGRAM REPORT   Patient Name:   Danny Hampton Date of Exam: 11/25/2020 Medical Rec #:  [id number]            Height:       64.0 in Accession #:    [id number]           Weight:       188.5 lb Date of Birth:  02/01/1956            BSA:  1.908 m Patient Age:    66 years             BP:           108/74 mmHg Patient Gender: M                    HR:           109 bpm. Exam Location:  [facility] Procedure: 2D Echo, Cardiac Doppler and Color Doppler Indications:    Bacteremia R78.81  History:        Patient has prior history of Echocardiogram examinations, most                 recent 01/13/2019. Risk Factors:Hypertension, Dyslipidemia,                 Former Smoker and Diabetes. Dysrhythmia (From Hx).  Sonographer:    Alvino Chapel RCS Referring Phys: [place]  1. Left ventricular ejection fraction, by estimation, is 60 to 65%. The left ventricle has normal function. The left ventricle has no regional wall motion abnormalities. There is mild concentric left ventricular hypertrophy. Left ventricular diastolic parameters were normal.  2. Right ventricular systolic  function is mildly reduced. The right ventricular size is moderately enlarged. Tricuspid regurgitation signal is inadequate for assessing PA pressure.  3. The mitral valve is grossly normal. Trivial mitral valve regurgitation. No evidence of mitral stenosis.  4. The aortic valve is tricuspid. Aortic valve regurgitation is not visualized. No aortic stenosis is present.  5. The inferior vena cava is normal in size with <50% respiratory variability, suggesting right atrial pressure of 8 mmHg. Comparison(s): Changes from prior study are noted. LV function remains unchanged. RV appears moderately dilated with mildly reduced function which is new from prior study. Conclusion(s)/Recommendation(s): No evidence of valvular vegetations on this transthoracic echocardiogram. Would recommend a transesophageal echocardiogram to exclude infective endocarditis if clinically indicated. FINDINGS  Left Ventricle: Left ventricular ejection fraction, by estimation, is 60 to 65%. The left ventricle has normal function. The left ventricle has no regional wall motion abnormalities. The left ventricular internal cavity size was normal in size. There is  mild concentric left ventricular hypertrophy. Left ventricular diastolic parameters were normal. Right Ventricle: The right ventricular size is moderately enlarged. No increase in right ventricular wall thickness. Right ventricular systolic function is mildly reduced. Tricuspid regurgitation signal is inadequate for assessing PA pressure. Left Atrium: Left atrial size was normal in size. Right Atrium: Right atrial size was normal in size. Pericardium: Trivial pericardial effusion is present. Presence of pericardial fat pad. Mitral Valve: The mitral valve is grossly normal. Trivial mitral valve regurgitation. No evidence of mitral valve stenosis. Tricuspid Valve: The tricuspid valve is grossly normal. Tricuspid valve regurgitation is trivial. No evidence of tricuspid stenosis. Aortic Valve:  The aortic valve is tricuspid. Aortic valve regurgitation is not visualized. No aortic stenosis is present. Pulmonic Valve: The pulmonic valve was grossly normal. Pulmonic valve regurgitation is trivial. No evidence of pulmonic stenosis. Aorta: The aortic root is normal in size and structure. Venous: The inferior vena cava is normal in size with less than 50% respiratory variability, suggesting right atrial pressure of 8 mmHg. IAS/Shunts: The atrial septum is grossly normal.  LEFT VENTRICLE PLAX 2D LVIDd:         4.45 cm  Diastology LVIDs:         2.80 cm  LV e' medial:    7.29 cm/s LV PW:  1.30 cm  LV E/e' medial:  12.1 LV IVS:        1.10 cm  LV e' lateral:   10.69 cm/s LVOT diam:     2.10 cm  LV E/e' lateral: 8.3 LV SV:         72 LV SV Index:   37 LVOT Area:     3.46 cm  RIGHT VENTRICLE RV S prime:     14.60 cm/s TAPSE (M-mode): 2.0 cm LEFT ATRIUM             Index       RIGHT ATRIUM           Index LA diam:        2.80 cm 1.47 cm/m  RA Area:     16.60 cm LA Vol (A2C):   54.8 ml 28.73 ml/m RA Volume:   42.10 ml  22.07 ml/m LA Vol (A4C):   53.7 ml 28.15 ml/m LA Biplane Vol: 57.3 ml 30.04 ml/m  AORTIC VALVE LVOT Vmax:   111.50 cm/s LVOT Vmean:  78.900 cm/s LVOT VTI:    0.207 m  AORTA Ao Root diam: 3.70 cm MITRAL VALVE MV Area (PHT): 4.07 cm     SHUNTS MV Decel Time: 187 msec     Systemic VTI:  0.21 m MV E velocity: 88.50 cm/s   Systemic Diam: 2.10 cm MV A velocity: 112.00 cm/s MV E/A ratio:  0.79 Eleonore Chiquito MD Electronically signed by Eleonore Chiquito MD Signature Date/Time: 11/25/2020/3:42:15 PM    Final      Anti-infectives (From admission, onward)    Start     Dose/Rate Route Frequency Ordered Stop   11/27/20 1200  nafcillin 12 g in sodium chloride 0.9 % 500 mL continuous infusion        12 g 20.8 mL/hr over 24 Hours Intravenous Every 24 hours 11/27/20 1007     11/25/20 1400  ceFAZolin (ANCEF) IVPB 2g/100 mL premix  Status:  Discontinued        2 g 200 mL/hr over 30 Minutes Intravenous  Every 8 hours 11/25/20 0915 11/27/20 1007   11/24/20 1400  piperacillin-tazobactam (ZOSYN) IVPB 3.375 g  Status:  Discontinued       See Hyperspace for full Linked Orders Report.   3.375 g 12.5 mL/hr over 240 Minutes Intravenous Every 8 hours 11/24/20 0755 11/25/20 0915   11/24/20 0800  piperacillin-tazobactam (ZOSYN) IVPB 3.375 g       See Hyperspace for full Linked Orders Report.   3.375 g 100 mL/hr over 30 Minutes Intravenous  Once 11/24/20 0755 11/24/20 3154       Assessment/Plan Ileus with small bowel and colonic distension - Given abdominal distension and tenderness will repeat abdominal film this morning.  Agree with NG tube decompression. Monitor electrolytes and keep K>4 and Mag>2. Mobilize as able. Limit narcotics. No indication for surgical intervention. Recommend GI consult to discuss role of possible endoscopic colonic decompression.  ID - zosyn 9/17>>9/18, ancef 9/18>>9/20, nafcillin 9/20>> VTE - sq heparin FEN - NPO/NGT to LIWS Foley - none  S/p back surgery 10/31/20 by Dr. Saintclair Halsted  MSSA bacteremia A fib RVR GERD HTN Hypontonia congenita DM  [name], [facility] Surgery 11/27/2020, 9:24 AM Please see Amion for pager number during day hours 7:00am-4:30pm

## 2020-11-27 NOTE — Progress Notes (Signed)
Patient received in bed unstable with HR 150s to 160s, patient on Cardizem drip at 15, MD notified and rapid response nurse notified, ordered given, patient will be transferred to the ICU [id number]

## 2020-11-28 ENCOUNTER — Inpatient Hospital Stay (HOSPITAL_COMMUNITY): Payer: No Typology Code available for payment source

## 2020-11-28 ENCOUNTER — Encounter (HOSPITAL_COMMUNITY): Payer: Self-pay | Admitting: Neurological Surgery

## 2020-11-28 DIAGNOSIS — R7881 Bacteremia: Secondary | ICD-10-CM | POA: Diagnosis not present

## 2020-11-28 DIAGNOSIS — B9561 Methicillin susceptible Staphylococcus aureus infection as the cause of diseases classified elsewhere: Secondary | ICD-10-CM | POA: Diagnosis not present

## 2020-11-28 DIAGNOSIS — J9601 Acute respiratory failure with hypoxia: Secondary | ICD-10-CM | POA: Diagnosis not present

## 2020-11-28 DIAGNOSIS — R140 Abdominal distension (gaseous): Secondary | ICD-10-CM

## 2020-11-28 DIAGNOSIS — I483 Typical atrial flutter: Secondary | ICD-10-CM

## 2020-11-28 DIAGNOSIS — R748 Abnormal levels of other serum enzymes: Secondary | ICD-10-CM | POA: Diagnosis not present

## 2020-11-28 DIAGNOSIS — K9189 Other postprocedural complications and disorders of digestive system: Secondary | ICD-10-CM | POA: Diagnosis not present

## 2020-11-28 DIAGNOSIS — M545 Low back pain, unspecified: Secondary | ICD-10-CM | POA: Diagnosis not present

## 2020-11-28 LAB — CBC WITH DIFFERENTIAL/PLATELET
Abs Immature Granulocytes: 0.05 10*3/uL (ref 0.00–0.07)
Basophils Absolute: 0 10*3/uL (ref 0.0–0.1)
Basophils Relative: 0 %
Eosinophils Absolute: 0 10*3/uL (ref 0.0–0.5)
Eosinophils Relative: 0 %
HCT: 28.4 % — ABNORMAL LOW (ref 39.0–52.0)
Hemoglobin: 9.6 g/dL — ABNORMAL LOW (ref 13.0–17.0)
Immature Granulocytes: 1 %
Lymphocytes Relative: 8 %
Lymphs Abs: 0.5 10*3/uL — ABNORMAL LOW (ref 0.7–4.0)
MCH: 31.2 pg (ref 26.0–34.0)
MCHC: 33.8 g/dL (ref 30.0–36.0)
MCV: 92.2 fL (ref 80.0–100.0)
Monocytes Absolute: 0.9 10*3/uL (ref 0.1–1.0)
Monocytes Relative: 15 %
Neutro Abs: 4.3 10*3/uL (ref 1.7–7.7)
Neutrophils Relative %: 76 %
Platelets: 97 10*3/uL — ABNORMAL LOW (ref 150–400)
RBC: 3.08 MIL/uL — ABNORMAL LOW (ref 4.22–5.81)
RDW: 12.9 % (ref 11.5–15.5)
WBC: 5.8 10*3/uL (ref 4.0–10.5)
nRBC: 0 % (ref 0.0–0.2)

## 2020-11-28 LAB — GLUCOSE, CAPILLARY
Glucose-Capillary: 174 mg/dL — ABNORMAL HIGH (ref 70–99)
Glucose-Capillary: 161 mg/dL — ABNORMAL HIGH (ref 70–99)
Glucose-Capillary: 150 mg/dL — ABNORMAL HIGH (ref 70–99)
Glucose-Capillary: 175 mg/dL — ABNORMAL HIGH (ref 70–99)
Glucose-Capillary: 132 mg/dL — ABNORMAL HIGH (ref 70–99)
Glucose-Capillary: 106 mg/dL — ABNORMAL HIGH (ref 70–99)

## 2020-11-28 LAB — HEPATITIS PANEL, ACUTE
HCV Ab: NONREACTIVE
Hep A IgM: NONREACTIVE
Hep B C IgM: NONREACTIVE
Hepatitis B Surface Ag: NONREACTIVE

## 2020-11-28 LAB — MAGNESIUM: Magnesium: 1.9 mg/dL (ref 1.7–2.4)

## 2020-11-28 LAB — COMPREHENSIVE METABOLIC PANEL
ALT: 103 U/L — ABNORMAL HIGH (ref 0–44)
AST: 296 U/L — ABNORMAL HIGH (ref 15–41)
Albumin: 2.3 g/dL — ABNORMAL LOW (ref 3.5–5.0)
Alkaline Phosphatase: 53 U/L (ref 38–126)
Anion gap: 16 — ABNORMAL HIGH (ref 5–15)
BUN: 16 mg/dL (ref 8–23)
CO2: 22 mmol/L (ref 22–32)
Calcium: 8.4 mg/dL — ABNORMAL LOW (ref 8.9–10.3)
Chloride: 97 mmol/L — ABNORMAL LOW (ref 98–111)
Creatinine, Ser: 1.08 mg/dL (ref 0.61–1.24)
GFR, Estimated: >60 mL/min (ref >60)
Glucose, Bld: 181 mg/dL — ABNORMAL HIGH (ref 70–99)
Potassium: 3.7 mmol/L (ref 3.5–5.1)
Sodium: 135 mmol/L (ref 135–145)
Total Bilirubin: 1.7 mg/dL — ABNORMAL HIGH (ref 0.3–1.2)
Total Protein: 5.8 g/dL — ABNORMAL LOW (ref 6.5–8.1)

## 2020-11-28 LAB — PHOSPHORUS: Phosphorus: 2.5 mg/dL (ref 2.5–4.6)

## 2020-11-28 IMAGING — DX DG ABD PORTABLE 1V
1 series · 2 of 2 positions shown · non-contrast
Comparison: [DATE]

CLINICAL DATA: Follow-up ileus

EXAM:
PORTABLE ABDOMEN - 1 VIEW

[Series 1: abdomen · 0.14mm/px · 2 of 2 slices shown]
[im 1/2]
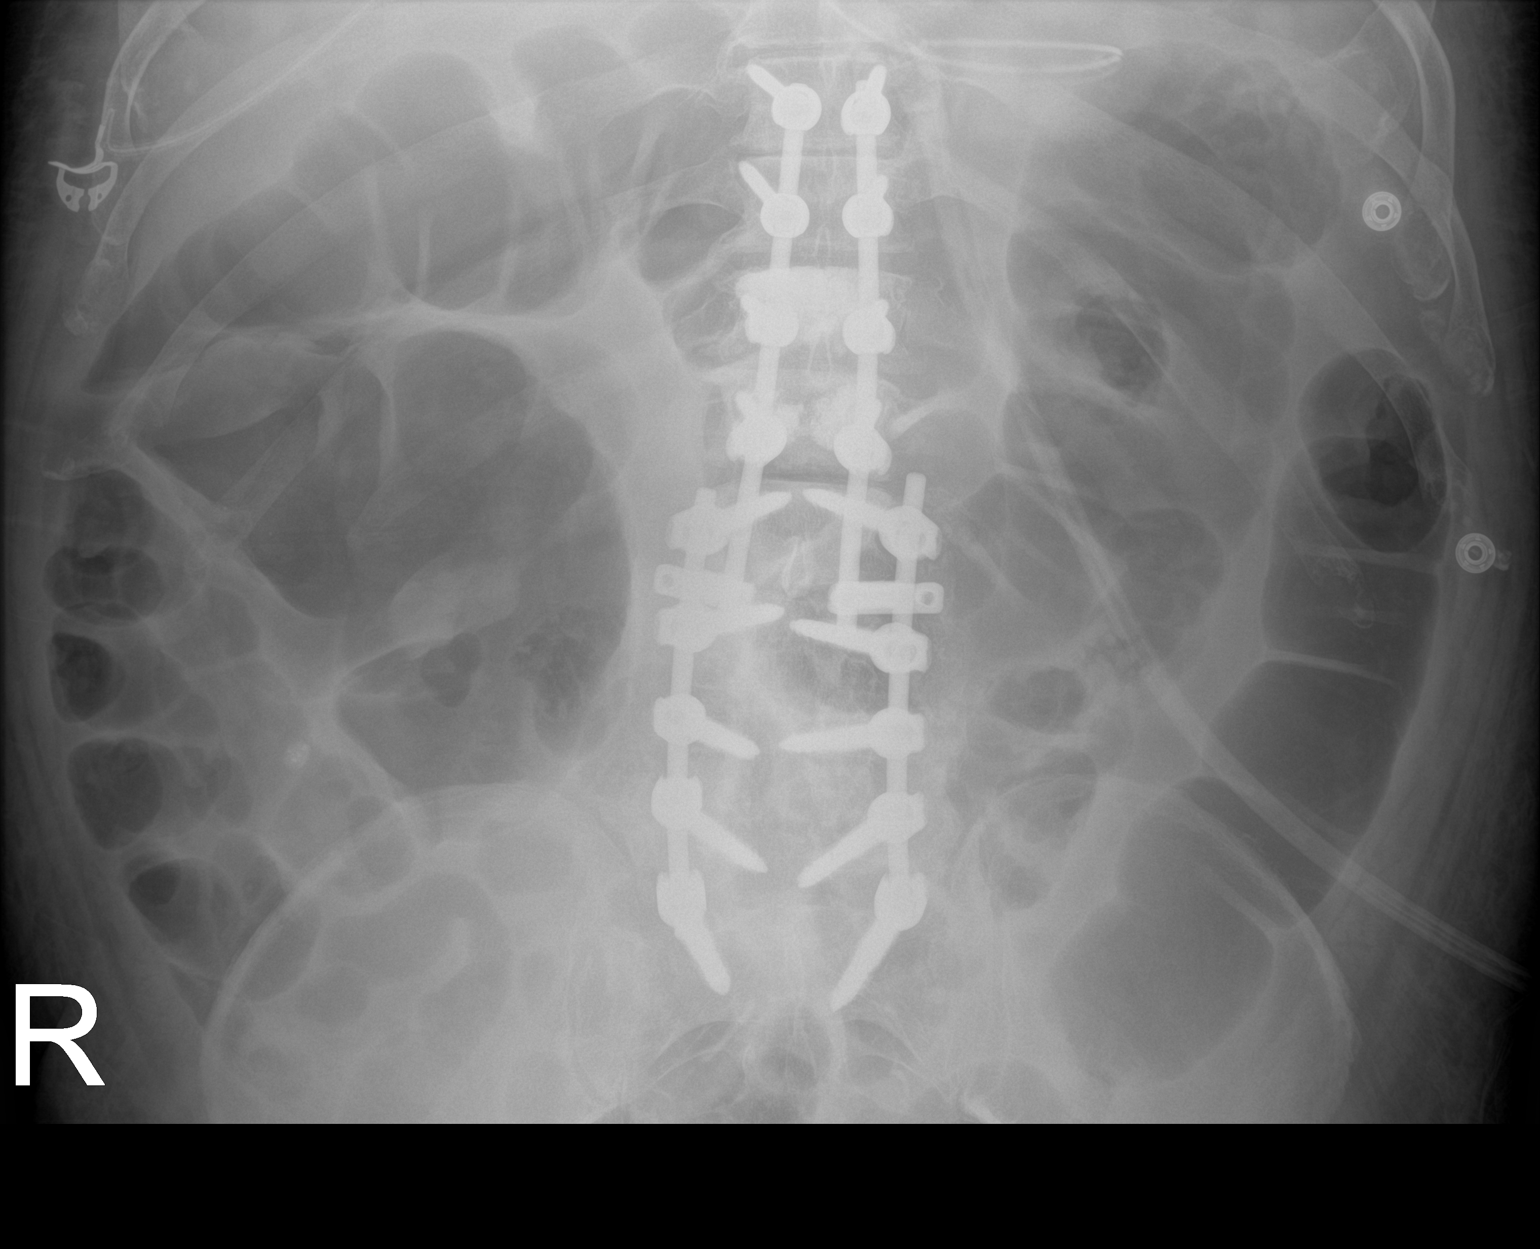
[im 2/2]
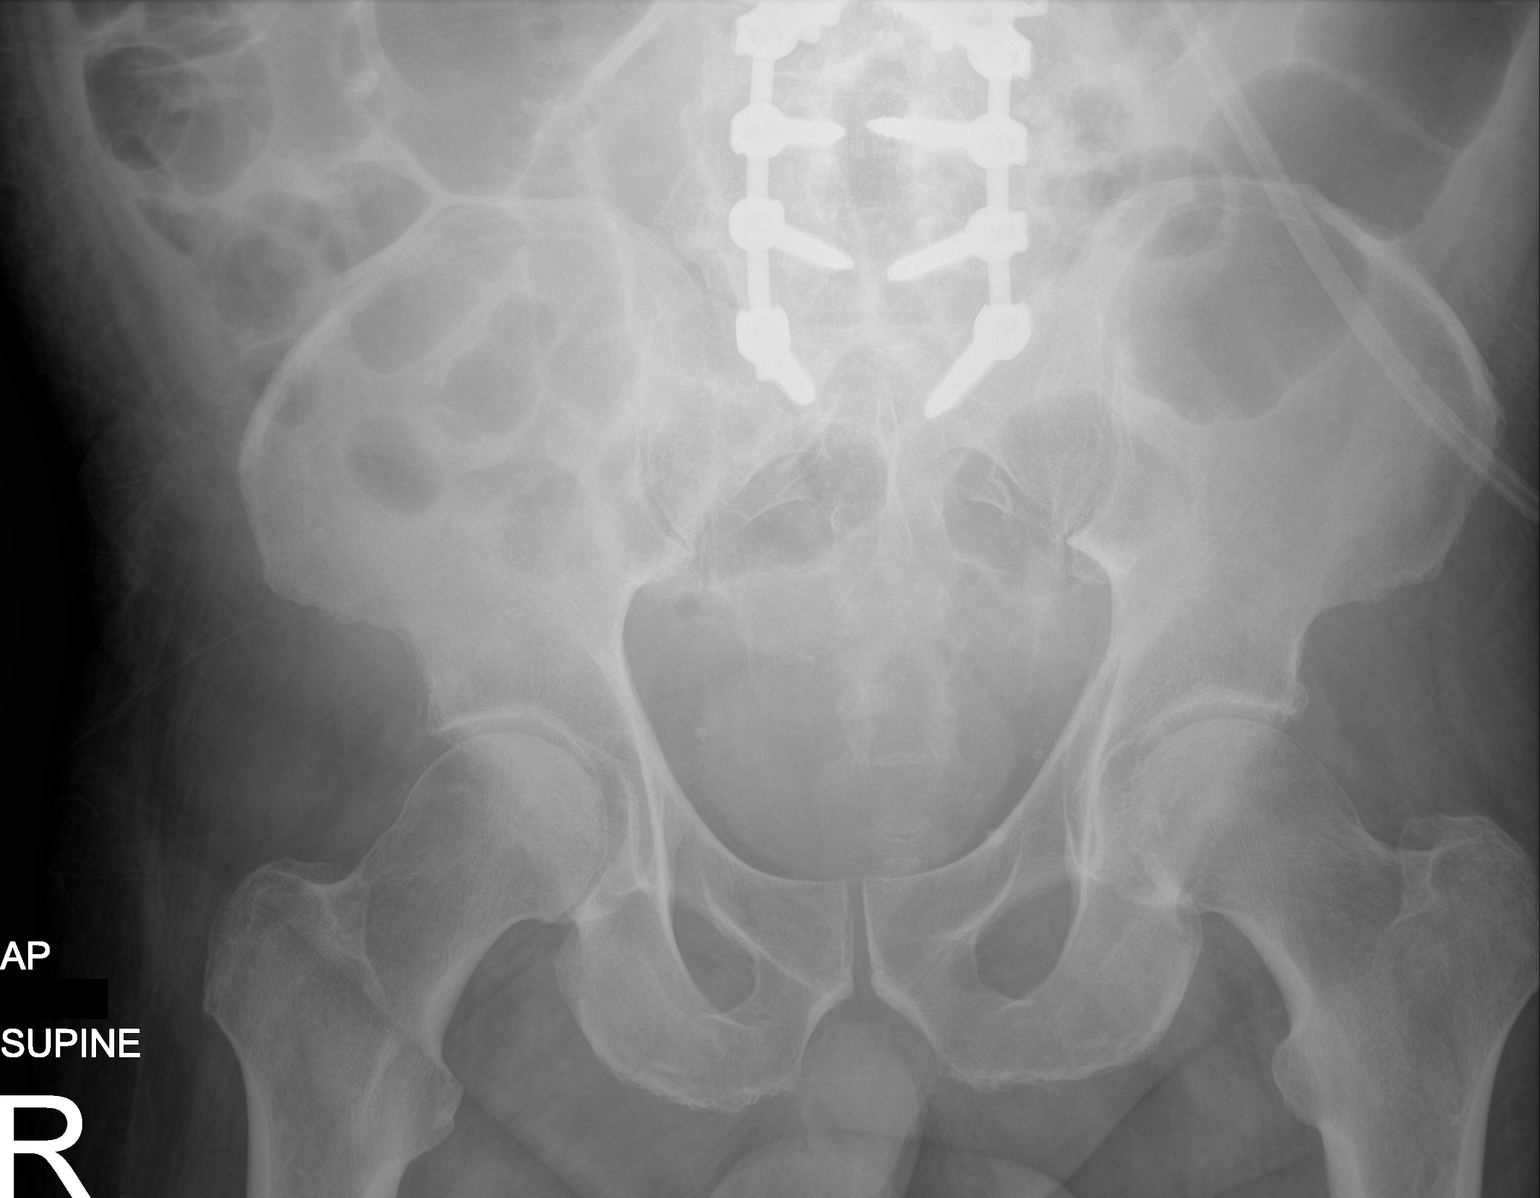

[2 of 2 positions shown; findings below may reference images not displayed]

FINDINGS: Stable bowel gas pattern is noted throughout the large and small
bowel consistent with the known history of ileus. No free air is
seen. Postsurgical changes in the lumbar and thoracic spine are
noted. No other focal abnormality is noted.
IMPRESSION: Stable ileus pattern when compare with the prior day.

## 2020-11-28 MED ORDER — BUDESONIDE 0.25 MG/2ML IN SUSP
0.2500 mg | Freq: Two times a day (BID) | RESPIRATORY_TRACT | Status: DC
  Administered 2020-11-28 – 2020-12-07 (×18): 0.25 mg via RESPIRATORY_TRACT
  Filled 2020-11-28 (×17): qty 2

## 2020-11-28 MED ORDER — METOPROLOL SUCCINATE ER 50 MG PO TB24: 50.0000 mg | ORAL_TABLET | Freq: Every day | ORAL | Status: DC

## 2020-11-28 MED ORDER — CARVEDILOL 25 MG PO TABS
25.0000 mg | ORAL_TABLET | Freq: Two times a day (BID) | ORAL | Status: DC
  Administered 2020-11-28 – 2020-12-07 (×17): 25 mg via ORAL
  Filled 2020-11-28 (×4): qty 2
  Filled 2020-11-28: qty 1
  Filled 2020-11-28 (×2): qty 2
  Filled 2020-11-28 (×2): qty 1
  Filled 2020-11-28 (×2): qty 2
  Filled 2020-11-28 (×3): qty 1
  Filled 2020-11-28 (×3): qty 2
  Filled 2020-11-28: qty 1

## 2020-11-28 MED ORDER — BISACODYL 10 MG RE SUPP
10.0000 mg | Freq: Once | RECTAL | Status: AC
  Administered 2020-11-28: 10 mg via RECTAL
  Filled 2020-11-28: qty 1

## 2020-11-28 MED ORDER — LACTATED RINGERS IV BOLUS
1000.0000 mL | Freq: Once | INTRAVENOUS | Status: AC
  Administered 2020-11-28: 1000 mL via INTRAVENOUS

## 2020-11-28 MED ORDER — MAGNESIUM SULFATE 2 GM/50ML IV SOLN
2.0000 g | Freq: Once | INTRAVENOUS | Status: AC
  Administered 2020-11-28: 2 g via INTRAVENOUS
  Filled 2020-11-28: qty 50

## 2020-11-28 MED ORDER — BUDESONIDE 0.25 MG/2ML IN SUSP
0.2500 mg | Freq: Four times a day (QID) | RESPIRATORY_TRACT | Status: DC
  Administered 2020-11-28: 0.25 mg via RESPIRATORY_TRACT
  Filled 2020-11-28 (×2): qty 2

## 2020-11-28 MED ORDER — DILTIAZEM HCL 90 MG PO TABS
90.0000 mg | ORAL_TABLET | Freq: Four times a day (QID) | ORAL | Status: DC
  Administered 2020-11-28 – 2020-12-03 (×18): 90 mg via ORAL
  Filled 2020-11-28 (×23): qty 1

## 2020-11-28 MED ORDER — POTASSIUM CHLORIDE 10 MEQ/100ML IV SOLN
10.0000 meq | INTRAVENOUS | Status: AC
  Administered 2020-11-28 (×4): 10 meq via INTRAVENOUS
  Filled 2020-11-28 (×4): qty 100

## 2020-11-28 MED ORDER — INSULIN ASPART 100 UNIT/ML IJ SOLN
0.0000 [IU] | INTRAMUSCULAR | Status: DC
  Administered 2020-11-28: 1 [IU] via SUBCUTANEOUS
  Administered 2020-11-28: 2 [IU] via SUBCUTANEOUS
  Administered 2020-11-28: 1 [IU] via SUBCUTANEOUS
  Administered 2020-11-28 – 2020-11-29 (×2): 2 [IU] via SUBCUTANEOUS
  Administered 2020-11-29 – 2020-11-30 (×5): 1 [IU] via SUBCUTANEOUS
  Administered 2020-12-01: 3 [IU] via SUBCUTANEOUS
  Administered 2020-12-02: 2 [IU] via SUBCUTANEOUS
  Administered 2020-12-02 (×2): 1 [IU] via SUBCUTANEOUS
  Administered 2020-12-02: 2 [IU] via SUBCUTANEOUS
  Administered 2020-12-02: 1 [IU] via SUBCUTANEOUS
  Administered 2020-12-03: 2 [IU] via SUBCUTANEOUS
  Administered 2020-12-03 – 2020-12-04 (×6): 1 [IU] via SUBCUTANEOUS
  Administered 2020-12-05 – 2020-12-06 (×2): 2 [IU] via SUBCUTANEOUS
  Administered 2020-12-07 (×2): 1 [IU] via SUBCUTANEOUS

## 2020-11-28 MED ORDER — BISACODYL 10 MG RE SUPP
10.0000 mg | Freq: Every day | RECTAL | Status: DC
  Administered 2020-11-29 – 2020-12-02 (×4): 10 mg via RECTAL
  Filled 2020-11-28 (×7): qty 1

## 2020-11-28 NOTE — Progress Notes (Signed)
[facility]  Date of Admission:  11/23/2020           Reason for visit: Follow up on MSSA bacteremia  Current antibiotics: Nafcillin 9/20-present  Previous antibiotics: Cefazolin 9/18-9/20 Zosyn 9/17  ASSESSMENT:    65 y.o. adult admitted with:  MSSA bacteremia: Secondary to infection from recent spinal surgery with hardware placement.  Taken back to the OR yesterday for I&D.  Repeat blood cultures from 11/25/2020 currently no growth.   Ileus: General surgery and gastroenterology following. Severe sepsis Atrial fibrillation with RVR: Cardiology following Elevated LFTs Hypoxic respiratory failure Diabetes: A1c 6.5 History of right knee arthroplasty: Completed in 10/2018 Encephalopathy: Likely secondary to sepsis and improved today, however, consideration of septic emboli in the setting of bacteremia  RECOMMENDATIONS:    Continue nafcillin for now for preferred CNS coverage TEE MRI brain pending Appreciate neurosurgical intervention Follow-up hepatitis panel Follow-up repeat blood cultures and wound cultures Lab monitoring, glycemic control Monitor for other foci of infection Wound care Ileus management per surgery and GI Will follow   Principal Problem:   Staphylococcus aureus bacteremia Active Problems:   Acute respiratory failure with hypoxia (HCC)   GERD (gastroesophageal reflux disease)   Myotonia congenita   Hyperlipidemia   Intractable abdominal pain   Abdominal distension   Generalized abdominal pain   Postoperative wound infection   Ileus, postoperative (HCC)   Acute exacerbation of chronic low back pain   Ileus (HCC)   Abnormal transaminases    MEDICATIONS:    Scheduled Meds: . aspirin  81 mg Oral Daily  . [START ON 11/29/2020] bisacodyl  10 mg Rectal Daily  . budesonide (PULMICORT) nebulizer solution  0.25 mg Nebulization Q6H WA  . Carbidopa-Levodopa ER  1 tablet Oral QID  . carvedilol  25 mg Oral BID WC  .  Chlorhexidine Gluconate Cloth  6 each Topical Daily  . diltiazem  90 mg Oral Q6H  . docusate sodium  100 mg Oral BID  . heparin  5,000 Units Subcutaneous Q8H  . insulin aspart  0-9 Units Subcutaneous Q4H  . loratadine  10 mg Oral Daily  . metoCLOPramide (REGLAN) injection  10 mg Intravenous Q12H  . mupirocin ointment  1 application Nasal BID  . pantoprazole (PROTONIX) IV  40 mg Intravenous Q24H  . PARoxetine  40 mg Oral Daily   And  . PARoxetine  20 mg Oral QHS  . phenytoin (DILANTIN) IV  100 mg Intravenous Q8H  . senna  1 tablet Per Tube BID  . sodium chloride flush  3 mL Intravenous Q12H   Continuous Infusions: . sodium chloride Stopped (11/28/20 0549)  . diltiazem (CARDIZEM) infusion Stopped (11/28/20 0546)  . magnesium sulfate bolus IVPB 2 g (11/28/20 0945)  . nafcillin (NAFCIL) continuous infusion Stopped (11/27/20 1443)  . potassium chloride 10 mEq (11/28/20 1019)   PRN Meds:.acetaminophen **OR** acetaminophen, albuterol, HYDROmorphone (DILAUDID) injection, hydrOXYzine, LORazepam, menthol-cetylpyridinium **OR** phenol, ondansetron **OR** ondansetron (ZOFRAN) IV, oxyCODONE, polyethylene glycol, sodium chloride flush, sodium phosphate  SUBJECTIVE:   24 hour events:  Status post OR yesterday with neurosurgery for lumbar wound I&D and application of wound VAC. Maintained in the ICU overnight Creatinine stable, LFTs relatively stable WBC 5.8 Afebrile, T-max 98.2 Remains tachycardic and on nonrebreather Continued abdominal distention without bowel movement Blood cultures no growth 9/18.  Wound cultures with staph aureus as well. Abdominal ultrasound obtained yesterday unremarkable Abdominal x-ray shows persistent ileus pattern   He is more alert and less  confused today.  He continues to report abdominal pain and distention.  His back pain is improved.  Review of Systems  All other systems reviewed and are negative.    OBJECTIVE:   Blood pressure 132/87, pulse (!) 137,  temperature 97.8 F (36.6 C), temperature source Axillary, resp. rate (!) 27, height 5\' 4"  (1.626 m), weight 94.2 kg, SpO2 100 %. Body mass index is 35.65 kg/m.  Physical Exam Constitutional:      Appearance: He is ill-appearing.  HENT:     Head: Normocephalic and atraumatic.  Eyes:     Extraocular Movements: Extraocular movements intact.     Conjunctiva/sclera: Conjunctivae normal.  Neck:     Comments: Right IJ CVC Cardiovascular:     Rate and Rhythm: Tachycardia present. Rhythm irregular.  Pulmonary:     Effort: No respiratory distress.     Breath sounds: Wheezing present.     Comments: He is on high flow nasal cannula Abdominal:     General: There is distension.     Tenderness: There is abdominal tenderness. There is no guarding or rebound.  Musculoskeletal:     Cervical back: Normal range of motion and neck supple.     Right lower leg: No edema.     Left lower leg: No edema.     Comments: He is freely able to move all 4 extremities Wound VAC in place Prior right TKA scar without warmth or erythema of his right knee  Skin:    General: Skin is warm and dry.     Findings: No rash.  Neurological:     General: No focal deficit present.     Mental Status: He is alert and oriented to person, place, and time.     Motor: No weakness.  Psychiatric:        Mood and Affect: Mood normal.        Behavior: Behavior normal.     Lab Results: Lab Results  Component Value Date   WBC 5.8 11/28/2020   HGB 9.6 (L) 11/28/2020   HCT 28.4 (L) 11/28/2020   MCV 92.2 11/28/2020   PLT 97 (L) 11/28/2020    Lab Results  Component Value Date   NA 135 11/28/2020   K 3.7 11/28/2020   CO2 22 11/28/2020   GLUCOSE 181 (H) 11/28/2020   BUN 16 11/28/2020   CREATININE 1.08 11/28/2020   CALCIUM 8.4 (L) 11/28/2020   GFRNONAA >60 11/28/2020   GFRAA >60 01/27/2019    Lab Results  Component Value Date   ALT 103 (H) 11/28/2020   AST 296 (H) 11/28/2020   ALKPHOS 53 11/28/2020   BILITOT 1.7  (H) 11/28/2020       Component Value Date/Time   CRP 57.5 (H) 11/26/2020 0454       Component Value Date/Time   ESRSEDRATE 73 (H) 11/26/2020 0454     I have reviewed the micro and lab results in Epic.  Imaging: MR CERVICAL SPINE WO CONTRAST  Result Date: 11/26/2020 CLINICAL DATA:  Spinal fusion, thoracic, follow up Epidural abscess; Spinal fusion, lumbosacral, follow up EXAM: MRI CERVICAL, THORACIC AND LUMBAR SPINE WITHOUT AND WITH CONTRAST TECHNIQUE: Multiplanar and multiecho pulse sequences of the cervical spine, to include the craniocervical junction and cervicothoracic junction, and thoracic and lumbar spine, were obtained without and with intravenous contrast. CONTRAST:  12mL GADAVIST GADOBUTROL 1 MMOL/ML IV SOLN COMPARISON:  CT myelogram September 03, 2020. FINDINGS: MRI CERVICAL SPINE FINDINGS Significantly motion limited study.  Within this limitation: Alignment: Similar  alignment with reversal of the normal cervical lordosis and grade 1 anterolisthesis of C3 on C4, C4 on C5, and C6 on C7. Vertebrae: Motion limited evaluation. Degenerative/discogenic endplate signal changes at multiple levels. Otherwise, no obvious marrow edema to suggest acute fracture or discitis/osteomyelitis. Cord: Motion limited evaluation without definite cord signal abnormality. Posterior Fossa, vertebral arteries, paraspinal tissues: Visualized vertebral artery flow voids are maintained. No acute findings in the visualized posterior fossa. There is nonspecific ill-defined edema in the posterior paraspinal soft tissues. Disc levels: C2-C3: No significant disc protrusion, foraminal stenosis, or canal stenosis. C3-C4: Posterior disc osteophyte complex with left greater than right facet and uncovertebral hypertrophy. Resulting moderate to severe left foraminal stenosis and mild canal stenosis. C4-C5: Posterior disc osteophyte complex with right greater than left facet and uncovertebral hypertrophy. Resulting severe right  foraminal stenosis and moderate canal stenosis. Flattening of the ventral cord. C5-C6: Posterior disc osteophyte complex with mild bilateral facet and uncovertebral hypertrophy. Mild canal stenosis without significant foraminal stenosis. C6-C7: Posterior disc osteophyte complex with central inferiorly dissecting disc protrusion versus calcified PLL (as seen on prior myelogram). Resulting moderate canal stenosis. Bilateral facet and uncovertebral hypertrophy with mild to moderate bilateral foraminal stenosis. C7-T1: Posterior disc osteophyte complex with left greater than right facet and uncovertebral hypertrophy. Central disc protrusion with inferior extension. Resulting mild-to-moderate canal stenosis and mild bilateral foraminal stenosis. MRI THORACIC SPINE FINDINGS Alignment:  Normal. Vertebrae: Interval posterior fusion spanning from T10 inferiorly into the lumbar spine. Nonspecific mild edema within the disc spaces at T10-T11, T11-T12, and T12-L1. Cord:  Normal cord signal. Paraspinal and other soft tissues: Postoperative fluid collection in the posterior paraspinal soft tissues in the lower thoracic spine and lumbar spine (detailed further below). Disc levels: Mild Mild disc bulging at multiple upper to mid thoracic levels. At T7-T8 right paracentral disc protrusion flattens the ventral cord with mild canal stenosis, similar to prior. At T8-T9, disc bulging and endplate spurring results in mild canal stenosis, similar. At T9-T10, evaluation is limited by metallic artifact; however, posterior disc bulging appears to result in mild to moderate canal stenosis, possibly progressed from the prior but poorly characterized. Interval extension of the patient's lumbar fusion superiorly to T10. At T10-T11, the canal appears to be mild to moderately moderately narrowed, similar versus slightly progressed. At T11-T12 the canal appears to be patent. MRI LUMBAR SPINE FINDINGS Motion limited evaluation.  Within this  limitation: Segmentation:  Standard. Alignment:  Normal. Vertebrae:  Interval posterior fusion spanning from T10 to S1. Conus medullaris and cauda equina: Conus extends to the L1-L2 level. Conus appears normal. Paraspinal and other soft tissues: Postoperative edema in the paraspinal soft tissues with fluid collection in the paraspinal soft tissues measuring up to 8 x 3 x 63 mm, centered at T12-L1. There is also nonspecific fluid surrounding the posterior elements bilaterally at L2-L3. Disc levels: T12-L1: Posterior fusion. Mild canal stenosis. Limited evaluation of foramina due to metallic artifact. L1-L2: Posterior fusion and decompression. Improved canal stenosis with patent canal. Limited evaluation of the foramina due to metallic artifact with visualized portions appearing patent. L2-L3: Prior posterior decompression and fusion. Osteophytic ridging. Facet hypertrophy. Mild canal stenosis, similar. Limited evaluation of foramina with likely mild bilateral foraminal stenosis. L3-L4: Prior posterior fusion and decompression. Patent canal and foramina. L4-L5: Prior posterior fusion and decompression. Patent canal with probably mild bilateral foraminal stenosis. L5-S1: Prior posterior fusion and decompression. Patent canal with mild left foraminal stenosis. IMPRESSION: MRI cervical spine: 1. Similar moderate canal stenosis at  C4-C5 and C6-C7 2. Similar severe foraminal stenosis on the right at C4-C5 and moderate to severe foraminal stenosis on the left at C3-C4. 3. 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 4. Nonspecific ill-defined edema in the posterior paraspinal soft tissues, which could represent strain or cellulitis. MRI thoracic and lumbar spine: 1. Status post extension of lumbar fusion superiorly to T10. Nonspecific postoperative edema and fluid collection in the posterior paraspinal soft tissues centered at  T12, which could represent a seroma but abscess is not excluded given reported purulent drainage. There also is nonspecific fluid surrounding the posterior elements at L2-L3. 2. Mild edema within the discs at T10-T11 and T12-L1, which could be degenerative or stress related but early discitis is difficult to exclude given the clinical history. No clearly infectious findings within the canal, although evaluation is limited by metallic artifact and motion. If there is clinical concern for infection, consider correlation with inflammatory markers and close interval follow-up MRI with contrast as clinically indicated. 3. Improved canal stenosis and foraminal stenosis at L1-L2 status post decompression and fusion. 4. Mild to moderate canal stenosis at T9-T10 and T10-T11 appears similar versus slightly progressed. Similar mild canal stenosis at T7-T8. Electronically Signed   By: Margaretha Sheffield M.D.   On: 11/26/2020 16:39   MR THORACIC SPINE W WO CONTRAST  Result Date: 11/26/2020 CLINICAL DATA:  Spinal fusion, thoracic, follow up Epidural abscess; Spinal fusion, lumbosacral, follow up EXAM: MRI CERVICAL, THORACIC AND LUMBAR SPINE WITHOUT AND WITH CONTRAST TECHNIQUE: Multiplanar and multiecho pulse sequences of the cervical spine, to include the craniocervical junction and cervicothoracic junction, and thoracic and lumbar spine, were obtained without and with intravenous contrast. CONTRAST:  95mL GADAVIST GADOBUTROL 1 MMOL/ML IV SOLN COMPARISON:  CT myelogram September 03, 2020. FINDINGS: MRI CERVICAL SPINE FINDINGS Significantly motion limited study.  Within this limitation: Alignment: Similar alignment with reversal of the normal cervical lordosis and grade 1 anterolisthesis of C3 on C4, C4 on C5, and C6 on C7. Vertebrae: Motion limited evaluation. Degenerative/discogenic endplate signal changes at multiple levels. Otherwise, no obvious marrow edema to suggest acute fracture or discitis/osteomyelitis. Cord: Motion  limited evaluation without definite cord signal abnormality. Posterior Fossa, vertebral arteries, paraspinal tissues: Visualized vertebral artery flow voids are maintained. No acute findings in the visualized posterior fossa. There is nonspecific ill-defined edema in the posterior paraspinal soft tissues. Disc levels: C2-C3: No significant disc protrusion, foraminal stenosis, or canal stenosis. C3-C4: Posterior disc osteophyte complex with left greater than right facet and uncovertebral hypertrophy. Resulting moderate to severe left foraminal stenosis and mild canal stenosis. C4-C5: Posterior disc osteophyte complex with right greater than left facet and uncovertebral hypertrophy. Resulting severe right foraminal stenosis and moderate canal stenosis. Flattening of the ventral cord. C5-C6: Posterior disc osteophyte complex with mild bilateral facet and uncovertebral hypertrophy. Mild canal stenosis without significant foraminal stenosis. C6-C7: Posterior disc osteophyte complex with central inferiorly dissecting disc protrusion versus calcified PLL (as seen on prior myelogram). Resulting moderate canal stenosis. Bilateral facet and uncovertebral hypertrophy with mild to moderate bilateral foraminal stenosis. C7-T1: Posterior disc osteophyte complex with left greater than right facet and uncovertebral hypertrophy. Central disc protrusion with inferior extension. Resulting mild-to-moderate canal stenosis and mild bilateral foraminal stenosis. MRI THORACIC SPINE FINDINGS Alignment:  Normal. Vertebrae: Interval posterior fusion spanning from T10 inferiorly into the lumbar spine. Nonspecific mild edema within the disc spaces at T10-T11, T11-T12, and T12-L1. Cord:  Normal cord  signal. Paraspinal and other soft tissues: Postoperative fluid collection in the posterior paraspinal soft tissues in the lower thoracic spine and lumbar spine (detailed further below). Disc levels: Mild Mild disc bulging at multiple upper to mid  thoracic levels. At T7-T8 right paracentral disc protrusion flattens the ventral cord with mild canal stenosis, similar to prior. At T8-T9, disc bulging and endplate spurring results in mild canal stenosis, similar. At T9-T10, evaluation is limited by metallic artifact; however, posterior disc bulging appears to result in mild to moderate canal stenosis, possibly progressed from the prior but poorly characterized. Interval extension of the patient's lumbar fusion superiorly to T10. At T10-T11, the canal appears to be mild to moderately moderately narrowed, similar versus slightly progressed. At T11-T12 the canal appears to be patent. MRI LUMBAR SPINE FINDINGS Motion limited evaluation.  Within this limitation: Segmentation:  Standard. Alignment:  Normal. Vertebrae:  Interval posterior fusion spanning from T10 to S1. Conus medullaris and cauda equina: Conus extends to the L1-L2 level. Conus appears normal. Paraspinal and other soft tissues: Postoperative edema in the paraspinal soft tissues with fluid collection in the paraspinal soft tissues measuring up to 8 x 3 x 63 mm, centered at T12-L1. There is also nonspecific fluid surrounding the posterior elements bilaterally at L2-L3. Disc levels: T12-L1: Posterior fusion. Mild canal stenosis. Limited evaluation of foramina due to metallic artifact. L1-L2: Posterior fusion and decompression. Improved canal stenosis with patent canal. Limited evaluation of the foramina due to metallic artifact with visualized portions appearing patent. L2-L3: Prior posterior decompression and fusion. Osteophytic ridging. Facet hypertrophy. Mild canal stenosis, similar. Limited evaluation of foramina with likely mild bilateral foraminal stenosis. L3-L4: Prior posterior fusion and decompression. Patent canal and foramina. L4-L5: Prior posterior fusion and decompression. Patent canal with probably mild bilateral foraminal stenosis. L5-S1: Prior posterior fusion and decompression. Patent canal  with mild left foraminal stenosis. IMPRESSION: MRI cervical spine: 1. Similar moderate canal stenosis at C4-C5 and C6-C7 2. Similar severe foraminal stenosis on the right at C4-C5 and moderate to severe foraminal stenosis on the left at C3-C4. 3. 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 4. Nonspecific ill-defined edema in the posterior paraspinal soft tissues, which could represent strain or cellulitis. MRI thoracic and lumbar spine: 1. Status post extension of lumbar fusion superiorly to T10. Nonspecific postoperative edema and fluid collection in the posterior paraspinal soft tissues centered at T12, which could represent a seroma but abscess is not excluded given reported purulent drainage. There also is nonspecific fluid surrounding the posterior elements at L2-L3. 2. Mild edema within the discs at T10-T11 and T12-L1, which could be degenerative or stress related but early discitis is difficult to exclude given the clinical history. No clearly infectious findings within the canal, although evaluation is limited by metallic artifact and motion. If there is clinical concern for infection, consider correlation with inflammatory markers and close interval follow-up MRI with contrast as clinically indicated. 3. Improved canal stenosis and foraminal stenosis at L1-L2 status post decompression and fusion. 4. Mild to moderate canal stenosis at T9-T10 and T10-T11 appears similar versus slightly progressed. Similar mild canal stenosis at T7-T8. Electronically Signed   By: Margaretha Sheffield M.D.   On: 11/26/2020 16:39   MR Lumbar Spine W Wo Contrast  Result Date: 11/26/2020 CLINICAL DATA:  Spinal fusion, thoracic, follow up Epidural abscess; Spinal fusion, lumbosacral, follow up EXAM: MRI CERVICAL, THORACIC AND LUMBAR SPINE WITHOUT AND WITH CONTRAST TECHNIQUE:  Multiplanar and multiecho pulse sequences of the cervical  spine, to include the craniocervical junction and cervicothoracic junction, and thoracic and lumbar spine, were obtained without and with intravenous contrast. CONTRAST:  77mL GADAVIST GADOBUTROL 1 MMOL/ML IV SOLN COMPARISON:  CT myelogram September 03, 2020. FINDINGS: MRI CERVICAL SPINE FINDINGS Significantly motion limited study.  Within this limitation: Alignment: Similar alignment with reversal of the normal cervical lordosis and grade 1 anterolisthesis of C3 on C4, C4 on C5, and C6 on C7. Vertebrae: Motion limited evaluation. Degenerative/discogenic endplate signal changes at multiple levels. Otherwise, no obvious marrow edema to suggest acute fracture or discitis/osteomyelitis. Cord: Motion limited evaluation without definite cord signal abnormality. Posterior Fossa, vertebral arteries, paraspinal tissues: Visualized vertebral artery flow voids are maintained. No acute findings in the visualized posterior fossa. There is nonspecific ill-defined edema in the posterior paraspinal soft tissues. Disc levels: C2-C3: No significant disc protrusion, foraminal stenosis, or canal stenosis. C3-C4: Posterior disc osteophyte complex with left greater than right facet and uncovertebral hypertrophy. Resulting moderate to severe left foraminal stenosis and mild canal stenosis. C4-C5: Posterior disc osteophyte complex with right greater than left facet and uncovertebral hypertrophy. Resulting severe right foraminal stenosis and moderate canal stenosis. Flattening of the ventral cord. C5-C6: Posterior disc osteophyte complex with mild bilateral facet and uncovertebral hypertrophy. Mild canal stenosis without significant foraminal stenosis. C6-C7: Posterior disc osteophyte complex with central inferiorly dissecting disc protrusion versus calcified PLL (as seen on prior myelogram). Resulting moderate canal stenosis. Bilateral facet and uncovertebral hypertrophy with mild to moderate bilateral foraminal stenosis. C7-T1: Posterior disc  osteophyte complex with left greater than right facet and uncovertebral hypertrophy. Central disc protrusion with inferior extension. Resulting mild-to-moderate canal stenosis and mild bilateral foraminal stenosis. MRI THORACIC SPINE FINDINGS Alignment:  Normal. Vertebrae: Interval posterior fusion spanning from T10 inferiorly into the lumbar spine. Nonspecific mild edema within the disc spaces at T10-T11, T11-T12, and T12-L1. Cord:  Normal cord signal. Paraspinal and other soft tissues: Postoperative fluid collection in the posterior paraspinal soft tissues in the lower thoracic spine and lumbar spine (detailed further below). Disc levels: Mild Mild disc bulging at multiple upper to mid thoracic levels. At T7-T8 right paracentral disc protrusion flattens the ventral cord with mild canal stenosis, similar to prior. At T8-T9, disc bulging and endplate spurring results in mild canal stenosis, similar. At T9-T10, evaluation is limited by metallic artifact; however, posterior disc bulging appears to result in mild to moderate canal stenosis, possibly progressed from the prior but poorly characterized. Interval extension of the patient's lumbar fusion superiorly to T10. At T10-T11, the canal appears to be mild to moderately moderately narrowed, similar versus slightly progressed. At T11-T12 the canal appears to be patent. MRI LUMBAR SPINE FINDINGS Motion limited evaluation.  Within this limitation: Segmentation:  Standard. Alignment:  Normal. Vertebrae:  Interval posterior fusion spanning from T10 to S1. Conus medullaris and cauda equina: Conus extends to the L1-L2 level. Conus appears normal. Paraspinal and other soft tissues: Postoperative edema in the paraspinal soft tissues with fluid collection in the paraspinal soft tissues measuring up to 8 x 3 x 63 mm, centered at T12-L1. There is also nonspecific fluid surrounding the posterior elements bilaterally at L2-L3. Disc levels: T12-L1: Posterior fusion. Mild canal  stenosis. Limited evaluation of foramina due to metallic artifact. L1-L2: Posterior fusion and decompression. Improved canal stenosis with patent canal. Limited evaluation of the foramina due to metallic artifact with visualized portions appearing patent. L2-L3: Prior posterior decompression and fusion. Osteophytic ridging. Facet hypertrophy.  Mild canal stenosis, similar. Limited evaluation of foramina with likely mild bilateral foraminal stenosis. L3-L4: Prior posterior fusion and decompression. Patent canal and foramina. L4-L5: Prior posterior fusion and decompression. Patent canal with probably mild bilateral foraminal stenosis. L5-S1: Prior posterior fusion and decompression. Patent canal with mild left foraminal stenosis. IMPRESSION: MRI cervical spine: 1. Similar moderate canal stenosis at C4-C5 and C6-C7 2. Similar severe foraminal stenosis on the right at C4-C5 and moderate to severe foraminal stenosis on the left at C3-C4. 3. 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 4. Nonspecific ill-defined edema in the posterior paraspinal soft tissues, which could represent strain or cellulitis. MRI thoracic and lumbar spine: 1. Status post extension of lumbar fusion superiorly to T10. Nonspecific postoperative edema and fluid collection in the posterior paraspinal soft tissues centered at T12, which could represent a seroma but abscess is not excluded given reported purulent drainage. There also is nonspecific fluid surrounding the posterior elements at L2-L3. 2. Mild edema within the discs at T10-T11 and T12-L1, which could be degenerative or stress related but early discitis is difficult to exclude given the clinical history. No clearly infectious findings within the canal, although evaluation is limited by metallic artifact and motion. If there is clinical concern for infection, consider correlation with inflammatory  markers and close interval follow-up MRI with contrast as clinically indicated. 3. Improved canal stenosis and foraminal stenosis at L1-L2 status post decompression and fusion. 4. Mild to moderate canal stenosis at T9-T10 and T10-T11 appears similar versus slightly progressed. Similar mild canal stenosis at T7-T8. Electronically Signed   By: Margaretha Sheffield M.D.   On: 11/26/2020 16:39   US Abdomen Limited  Result Date: 11/27/2020 CLINICAL DATA:  Elevated LFTs and abdominal pain EXAM: ULTRASOUND ABDOMEN LIMITED RIGHT UPPER QUADRANT COMPARISON:  11/23/2020 CT FINDINGS: Gallbladder: No gallstones or wall thickening visualized. No sonographic Murphy sign noted by sonographer. Common bile duct: Diameter: 4.36 mm Liver: Hypodensity seen on recent CT examination is not well appreciated on today's exam. Portal vein is patent on color Doppler imaging with normal direction of blood flow towards the liver. Other: Minimal free fluid is noted in the right upper quadrant. Right-sided pleural effusion is noted. IMPRESSION: Minimal free fluid in the abdomen Small right-sided pleural effusion. No other focal abnormality is noted. Electronically Signed   By: Inez Catalina M.D.   On: 11/27/2020 22:11   DG Chest Port 1V same Day  Result Date: 11/27/2020 CLINICAL DATA:  Dyspnea. EXAM: PORTABLE CHEST 1 VIEW COMPARISON:  11/25/2020 FINDINGS: There is a nasogastric tube with tip below the GE junction. Stable cardiomediastinal contours. Low lung volumes with asymmetric elevation of the right hemidiaphragm. No airspace opacities. No pleural effusion or edema. IMPRESSION: Low lung volumes.  No acute findings. Electronically Signed   By: Kerby Moors M.D.   On: 11/27/2020 09:01   DG Abd Portable 1V  Result Date: 11/27/2020 CLINICAL DATA:  Follow-up ileus EXAM: PORTABLE ABDOMEN - 1 VIEW COMPARISON:  11/26/2020 FINDINGS: Similar ileus pattern with gas in small and large bowel. Nasogastric tube enters the stomach. Previously seen  rectal tube has been removed. No finding to suggest free air on the supine images. IMPRESSION: Persistent similar ileus pattern. Electronically Signed   By: Nelson Chimes M.D.   On: 11/27/2020 13:43     Imaging independently reviewed in Epic.    Raynelle Highland for [facility] [phone]  pager 11/28/2020, 10:23 AM  I spent greater than 35 minutes with the patient including greater than 50% of time in face to face counsel of the patient and in coordination of their care.

## 2020-11-28 NOTE — Consult Note (Signed)
NAME:  Danny Hampton, MRN:  [id number], DOB:  Dec 06, 1955, LOS: 4 ADMISSION DATE:  11/23/2020, CONSULTATION DATE: 11/27/2020 REFERRING MD: [facility], CHIEF COMPLAINT: Back and abdominal pain  History of Present Illness:  65 year old with a extensive past medical history is well-documented below.  He had spinal surgery August 24 of this year and returns with chief complaints of abdominal distention significant for ileus.  He was transferred from [facility] to [facility] for further evaluation by neurosurgery Dr. Saintclair Halsted due to his initial surgery.  He is positive for MSSA bacteremia for which he is on nafcillin.  If further develop atrial fibrillation with rapid ventricular response to 170s and is proven refractory to diltiazem drip.  He is noted to have a mottled lower extremities appears to be decompressing and will be transferred to intensive care unit for further evaluation and treatment  Significant Hospital Events: Including procedures, antibiotic start and stop dates in addition to other pertinent events   11/27/2020 transfer intensive care unit  Interim History / Subjective:  Patient went to well yesterday for I&D of his infected surgical wound He remained afebrile continued to remain tachycardic with heart rate in 120s  Objective   Blood pressure 132/87, pulse (!) 137, temperature 97.7 F (36.5 C), temperature source Oral, resp. rate (!) 27, height 5\' 4"  (1.626 m), weight 94.2 kg, SpO2 100 %.        Intake/Output Summary (Last 24 hours) at 11/28/2020 1205 Last data filed at 11/28/2020 1012 Gross per 24 hour  Intake 3923.91 ml  Output 1890 ml  Net 2033.91 ml   Filed Weights   11/26/20 0411 11/27/20 0454 11/28/20 0500  Weight: 95.7 kg 94.6 kg 94.2 kg    Examination:   Physical exam: General: Crtitically ill-appearing morbidly obese male, lying in the bed on nonrebreather facemask HEENT: [place]/AT, eyes anicteric. OGT in place Neuro: Alert, awake, following commands,  moving all 4 extremities Chest: Coarse breath sounds, no wheezes or rhonchi Heart: Irregularly irregular, tachycardic, no murmurs or gallops Abdomen: Soft, distended, tender in epigastrium, hyperactive bowel sounds present Skin: No rash  Resolved Hospital Problem list     Assessment & Plan:  Sepsis due to surgical wound infection with MSSA s/p I&D and washout MSSA bacteremia  Continue nafcillin per discussion with infectious disease Continue IV fluid Repeat cultures are negative Surgical wound culture also growing MSSA TEE was negative for vegetations   New diagnosis of atrial fibrillation rapid ventricular response  Hypertension, uncontrolled Continue diltiazem infusion Started on oral diltiazem 90 mg every 6 hours and Coreg 25 mg twice daily Patient has a high CHA2DS2-VASc score, unable to anticoagulate due to recent procedure  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 General surgery is following We will give him rectal suppository Keeping serum potassium close to 4 and magnesium close to 2  New diagnosis of diabetes with hyperglycemia Continue sliding scale insulin Levemir added  Hyponatremia Serum sodium is improving Closely monitor  Acute kidney injury Serum creatinine has improved after IV fluid therapy, closely monitor Monitor intake and output Avoid nephrotoxic agents  Morbid obesity Dietitian follow-up  Diet/type: NPO DVT prophylaxis: prophylactic heparin  GI prophylaxis: N/A Lines: N/A Foley:  N/A Code Status:  full code Last date of multidisciplinary goals of care discussion [9/21: Spoke with patient at bedside, he would like to continue full aggressive care]  Labs   CBC: Recent Labs  Lab 11/24/20 0546 11/25/20 0306 11/26/20 0454 11/27/20 0348 11/28/20 0418  WBC 11.9* 6.8 6.4 5.8  5.8  NEUTROABS 10.1* 6.1 5.3 4.6 4.3  HGB 12.4* 9.6* 9.8* 10.0* 9.6*  HCT 36.9* 28.1* 29.5* 28.7* 28.4*  MCV 96.1 92.7 95.5 92.0 92.2  PLT 143* 104* 83*  77* 97*    Basic Metabolic Panel: Recent Labs  Lab 11/24/20 0546 11/25/20 0306 11/26/20 0454 11/27/20 0348 11/27/20 0851 11/28/20 0418  NA 130* 124* 124* 130*  --  135  K 3.7 3.2* 4.5 4.4  --  3.7  CL 89* 90* 91* 95*  --  97*  CO2 32 25 22 21*  --  22  GLUCOSE 175* 154* 138* 128*  --  181*  BUN 16 14 25* 23  --  16  CREATININE 0.78 0.79 1.36* 1.05  --  1.08  CALCIUM 8.6* 7.4* 7.6* 8.6*  --  8.4*  MG 1.5* 1.9  --   --  1.9 1.9  PHOS  --   --   --   --  2.4* 2.5   GFR: Estimated Creatinine Clearance (by C-G formula based on SCr of 1.08 mg/dL) Male: 57.8 mL/min Male: 70.6 mL/min Recent Labs  Lab 11/24/20 0546 11/24/20 0816 11/24/20 1157 11/24/20 1535 11/25/20 0306 11/26/20 0454 11/27/20 0348 11/27/20 0851 11/27/20 1013 11/28/20 0418  PROCALCITON 1.14  --   --   --  2.44 3.95  --   --   --   --   WBC 11.9*  --   --   --  6.8 6.4 5.8  --   --  5.8  LATICACIDVEN 2.5*   < > 2.6* 1.7  --   --   --  1.8 1.9  --    < > = values in this interval not displayed.    Liver Function Tests: Recent Labs  Lab 11/24/20 0546 11/25/20 0306 11/26/20 0454 11/27/20 0348 11/28/20 0418  AST 33 41 330* 393* 296*  ALT 17 9 13  77* 103*  ALKPHOS 90 60 55 50 53  BILITOT 0.6 0.6 0.8 1.0 1.7*  PROT 7.0 5.6* 6.4* 5.9* 5.8*  ALBUMIN 3.6 2.7* 2.8* 2.5* 2.3*   Recent Labs  Lab 11/27/20 0851  LIPASE 48   No results for input(s): AMMONIA in the last 168 hours.  ABG No results found for: PHART, PCO2ART, PO2ART, HCO3, TCO2, ACIDBASEDEF, O2SAT   Coagulation Profile: No results for input(s): INR, PROTIME in the last 168 hours.  Cardiac Enzymes: No results for input(s): CKTOTAL, CKMB, CKMBINDEX, TROPONINI in the last 168 hours.  HbA1C: Hgb A1c MFr Bld  Date/Time Value Ref Range Status  10/22/2020 10:48 AM 6.5 (H) 4.8 - 5.6 % Final    Comment:    (NOTE) Pre diabetes:          5.7%-6.4%  Diabetes:              >6.4%  Glycemic control for   <7.0% adults with diabetes    04/22/2020 06:05 AM 6.7 (H) 4.8 - 5.6 % Final    Comment:    (NOTE) Pre diabetes:          5.7%-6.4%  Diabetes:              >6.4%  Glycemic control for   <7.0% adults with diabetes     CBG: Recent Labs  Lab 11/27/20 1940 11/27/20 2317 11/28/20 0340 11/28/20 0742 11/28/20 1154  GLUCAP 192* 191* 174* 161* 150*    Total critical care time: 42 minutes  Performed by: [place] care time was exclusive of separately  billable procedures and treating other patients.   Critical care was necessary to treat or prevent imminent or life-threatening deterioration.   Critical care was time spent personally by me on the following activities: development of treatment plan with patient and/or surrogate as well as nursing, discussions with consultants, evaluation of patient's response to treatment, examination of patient, obtaining history from patient or surrogate, ordering and performing treatments and interventions, ordering and review of laboratory studies, ordering and review of radiographic studies, pulse oximetry and re-evaluation of patient's condition.   Jacky Kindle MD [facility] See Amion for pager If no response to pager, please call [phone] until 7pm After 7pm, Please call E-link [phone]

## 2020-11-28 NOTE — Progress Notes (Signed)
Progress Note  Patient Name: Danny Hampton Date of Encounter: 11/28/2020  [facility] Cardiologist: Rozann Lesches, MD   Subjective   Still having significant pain diffusely  Inpatient Medications    Scheduled Meds:  aspirin  81 mg Oral Daily   [START ON 11/29/2020] bisacodyl  10 mg Rectal Daily   budesonide (PULMICORT) nebulizer solution  0.25 mg Nebulization BID   Carbidopa-Levodopa ER  1 tablet Oral QID   carvedilol  25 mg Oral BID WC   Chlorhexidine Gluconate Cloth  6 each Topical Daily   diltiazem  90 mg Oral Q6H   docusate sodium  100 mg Oral BID   heparin  5,000 Units Subcutaneous Q8H   insulin aspart  0-9 Units Subcutaneous Q4H   loratadine  10 mg Oral Daily   metoCLOPramide (REGLAN) injection  10 mg Intravenous Q12H   mupirocin ointment  1 application Nasal BID   pantoprazole (PROTONIX) IV  40 mg Intravenous Q24H   PARoxetine  40 mg Oral Daily   And   PARoxetine  20 mg Oral QHS   phenytoin (DILANTIN) IV  100 mg Intravenous Q8H   senna  1 tablet Per Tube BID   sodium chloride flush  3 mL Intravenous Q12H   Continuous Infusions:  sodium chloride Stopped (11/28/20 0549)   diltiazem (CARDIZEM) infusion 20 mg/hr (11/28/20 1743)   nafcillin (NAFCIL) continuous infusion 20.8 mL/hr at 11/28/20 1700   PRN Meds: acetaminophen **OR** acetaminophen, albuterol, HYDROmorphone (DILAUDID) injection, hydrOXYzine, LORazepam, menthol-cetylpyridinium **OR** phenol, ondansetron **OR** ondansetron (ZOFRAN) IV, oxyCODONE, polyethylene glycol, sodium chloride flush, sodium phosphate   Vital Signs    Vitals:   11/28/20 1700 11/28/20 1715 11/28/20 1730 11/28/20 1745  BP: 122/87     Pulse: 81 83 83 80  Resp: 11 19 18  (!) 21  Temp:      TempSrc:      SpO2: 92% 91% 90% 91%  Weight:      Height:        Intake/Output Summary (Last 24 hours) at 11/28/2020 1826 Last data filed at 11/28/2020 1743 Gross per 24 hour  Intake 2804.06 ml  Output 1375 ml  Net 1429.06 ml    Last 3 Weights 11/28/2020 11/27/2020 11/26/2020  Weight (lbs) 207 lb 10.8 oz 208 lb 8.9 oz 210 lb 15.7 oz  Weight (kg) 94.2 kg 94.6 kg 95.7 kg      Telemetry    Atrial flutter with improved rate control, typical heart rate in the low 100s.- Personally Reviewed  ECG      Physical Exam   GEN: Mild distress, intermittent moaning Neck: No JVD Cardiac: Tachycardic, irregular, no murmurs, rubs, or gallops.  Respiratory: Clear to auscultation bilaterally. GI: Soft, nontender, non-distended  MS: No edema; No deformity.  Drain from back wound Neuro:  Nonfocal  Psych: Normal affect   Labs    High Sensitivity Troponin:  No results for input(s): TROPONINIHS in the last 720 hours.   Chemistry Recent Labs  Lab 11/25/20 0306 11/26/20 0454 11/27/20 0348 11/27/20 0851 11/28/20 0418  NA 124* 124* 130*  --  135  K 3.2* 4.5 4.4  --  3.7  CL 90* 91* 95*  --  97*  CO2 25 22 21*  --  22  GLUCOSE 154* 138* 128*  --  181*  BUN 14 25* 23  --  16  CREATININE 0.79 1.36* 1.05  --  1.08  CALCIUM 7.4* 7.6* 8.6*  --  8.4*  MG 1.9  --   --  1.9 1.9  PROT 5.6* 6.4* 5.9*  --  5.8*  ALBUMIN 2.7* 2.8* 2.5*  --  2.3*  AST 41 330* 393*  --  296*  ALT 9 13 77*  --  103*  ALKPHOS 60 55 50  --  53  BILITOT 0.6 0.8 1.0  --  1.7*  GFRNONAA >60 58* >60  --  >60  ANIONGAP 9 11 14   --  16*    Lipids No results for input(s): CHOL, TRIG, HDL, LABVLDL, LDLCALC, CHOLHDL in the last 168 hours.  Hematology Recent Labs  Lab 11/26/20 0454 11/27/20 0348 11/28/20 0418  WBC 6.4 5.8 5.8  RBC 3.09* 3.12* 3.08*  HGB 9.8* 10.0* 9.6*  HCT 29.5* 28.7* 28.4*  MCV 95.5 92.0 92.2  MCH 31.7 32.1 31.2  MCHC 33.2 34.8 33.8  RDW 12.8 13.0 12.9  PLT 83* 77* 97*   Thyroid  Recent Labs  Lab 11/27/20 0851  TSH 3.489    BNP Recent Labs  Lab 11/27/20 0851  BNP 249.2*    DDimer No results for input(s): DDIMER in the last 168 hours.   Radiology    US Abdomen Limited  Result Date: 11/27/2020 CLINICAL DATA:   Elevated LFTs and abdominal pain EXAM: ULTRASOUND ABDOMEN LIMITED RIGHT UPPER QUADRANT COMPARISON:  11/23/2020 CT FINDINGS: Gallbladder: No gallstones or wall thickening visualized. No sonographic Murphy sign noted by sonographer. Common bile duct: Diameter: 4.36 mm Liver: Hypodensity seen on recent CT examination is not well appreciated on today's exam. Portal vein is patent on color Doppler imaging with normal direction of blood flow towards the liver. Other: Minimal free fluid is noted in the right upper quadrant. Right-sided pleural effusion is noted. IMPRESSION: Minimal free fluid in the abdomen Small right-sided pleural effusion. No other focal abnormality is noted. Electronically Signed   By: Inez Catalina M.D.   On: 11/27/2020 22:11   DG Chest Port 1V same Day  Result Date: 11/27/2020 CLINICAL DATA:  Dyspnea. EXAM: PORTABLE CHEST 1 VIEW COMPARISON:  11/25/2020 FINDINGS: There is a nasogastric tube with tip below the GE junction. Stable cardiomediastinal contours. Low lung volumes with asymmetric elevation of the right hemidiaphragm. No airspace opacities. No pleural effusion or edema. IMPRESSION: Low lung volumes.  No acute findings. Electronically Signed   By: Kerby Moors M.D.   On: 11/27/2020 09:01   DG Abd Portable 1V  Result Date: 11/28/2020 CLINICAL DATA:  Follow-up ileus EXAM: PORTABLE ABDOMEN - 1 VIEW COMPARISON:  11/27/2020 FINDINGS: Stable bowel gas pattern is noted throughout the large and small bowel consistent with the known history of ileus. No free air is seen. Postsurgical changes in the lumbar and thoracic spine are noted. No other focal abnormality is noted. IMPRESSION: Stable ileus pattern when compare with the prior day. Electronically Signed   By: Inez Catalina M.D.   On: 11/28/2020 15:31   DG Abd Portable 1V  Result Date: 11/27/2020 CLINICAL DATA:  Follow-up ileus EXAM: PORTABLE ABDOMEN - 1 VIEW COMPARISON:  11/26/2020 FINDINGS: Similar ileus pattern with gas in small and  large bowel. Nasogastric tube enters the stomach. Previously seen rectal tube has been removed. No finding to suggest free air on the supine images. IMPRESSION: Persistent similar ileus pattern. Electronically Signed   By: Nelson Chimes M.D.   On: 11/27/2020 13:43    Cardiac Studies   LVEF 60 to 65%, no significant valvular pathology noted  Patient Profile     65 y.o. adult with bacteremia  Assessment & Plan  Atrial flutter: Likely related to his acute illness.  IV Cardizem helping control blood pressure and heart rate.  If he becomes unstable, he would need cardioversion.  If his heart rate no longer response to diltiazem, could try IV amiodarone.  Anticoagulation when safe from the standpoint of his back issues and bacteremia.  Echo did not show any evidence of vegetation.     For questions or updates, please contact [place] Please consult [email or web address] for contact info under        Signed, Larae Grooms, MD  11/28/2020, 6:26 PM

## 2020-11-28 NOTE — Progress Notes (Signed)
Subjective: Patient reports feeling better this morning decreased back pain  Objective: Vital signs in last 24 hours: Temp:  [97.8 F (36.6 C)-98.2 F (36.8 C)] 97.8 F (36.6 C) (09/21 0700) Pulse Rate:  [42-168] 137 (09/21 0815) Resp:  [11-30] 27 (09/21 0815) BP: (91-138)/(58-99) 132/87 (09/21 0800) SpO2:  [79 %-100 %] 100 % (09/21 0815) Arterial Line BP: (138-178)/(69-89) 178/83 (09/21 0815) Weight:  [94.2 kg] 94.2 kg (09/21 0500)  Intake/Output from previous day: 09/20 0701 - 09/21 0700 In: 3923.9 [I.V.:3758.3; IV Piggyback:165.7] Out: 2040 [Urine:2025; Blood:15] Intake/Output this shift: Total I/O In: -  Out: 625 [Urine:525; Emesis/NG output:100]  Denies any lower extremity symptoms strength 5 out of 5 lower extremities bilaterally incision looks good with wound VAC in place  Lab Results: Recent Labs    11/27/20 0348 11/28/20 0418  WBC 5.8 5.8  HGB 10.0* 9.6*  HCT 28.7* 28.4*  PLT 77* 97*   BMET Recent Labs    11/27/20 0348 11/28/20 0418  NA 130* 135  K 4.4 3.7  CL 95* 97*  CO2 21* 22  GLUCOSE 128* 181*  BUN 23 16  CREATININE 1.05 1.08  CALCIUM 8.6* 8.4*    Studies/Results: MR CERVICAL SPINE WO CONTRAST  Result Date: 11/26/2020 CLINICAL DATA:  Spinal fusion, thoracic, follow up Epidural abscess; Spinal fusion, lumbosacral, follow up EXAM: MRI CERVICAL, THORACIC AND LUMBAR SPINE WITHOUT AND WITH CONTRAST TECHNIQUE: Multiplanar and multiecho pulse sequences of the cervical spine, to include the craniocervical junction and cervicothoracic junction, and thoracic and lumbar spine, were obtained without and with intravenous contrast. CONTRAST:  61mL GADAVIST GADOBUTROL 1 MMOL/ML IV SOLN COMPARISON:  CT myelogram September 03, 2020. FINDINGS: MRI CERVICAL SPINE FINDINGS Significantly motion limited study.  Within this limitation: Alignment: Similar alignment with reversal of the normal cervical lordosis and grade 1 anterolisthesis of C3 on C4, C4 on C5, and C6 on C7.  Vertebrae: Motion limited evaluation. Degenerative/discogenic endplate signal changes at multiple levels. Otherwise, no obvious marrow edema to suggest acute fracture or discitis/osteomyelitis. Cord: Motion limited evaluation without definite cord signal abnormality. Posterior Fossa, vertebral arteries, paraspinal tissues: Visualized vertebral artery flow voids are maintained. No acute findings in the visualized posterior fossa. There is nonspecific ill-defined edema in the posterior paraspinal soft tissues. Disc levels: C2-C3: No significant disc protrusion, foraminal stenosis, or canal stenosis. C3-C4: Posterior disc osteophyte complex with left greater than right facet and uncovertebral hypertrophy. Resulting moderate to severe left foraminal stenosis and mild canal stenosis. C4-C5: Posterior disc osteophyte complex with right greater than left facet and uncovertebral hypertrophy. Resulting severe right foraminal stenosis and moderate canal stenosis. Flattening of the ventral cord. C5-C6: Posterior disc osteophyte complex with mild bilateral facet and uncovertebral hypertrophy. Mild canal stenosis without significant foraminal stenosis. C6-C7: Posterior disc osteophyte complex with central inferiorly dissecting disc protrusion versus calcified PLL (as seen on prior myelogram). Resulting moderate canal stenosis. Bilateral facet and uncovertebral hypertrophy with mild to moderate bilateral foraminal stenosis. C7-T1: Posterior disc osteophyte complex with left greater than right facet and uncovertebral hypertrophy. Central disc protrusion with inferior extension. Resulting mild-to-moderate canal stenosis and mild bilateral foraminal stenosis. MRI THORACIC SPINE FINDINGS Alignment:  Normal. Vertebrae: Interval posterior fusion spanning from T10 inferiorly into the lumbar spine. Nonspecific mild edema within the disc spaces at T10-T11, T11-T12, and T12-L1. Cord:  Normal cord signal. Paraspinal and other soft tissues:  Postoperative fluid collection in the posterior paraspinal soft tissues in the lower thoracic spine and lumbar spine (detailed further below). Disc levels:  Mild Mild disc bulging at multiple upper to mid thoracic levels. At T7-T8 right paracentral disc protrusion flattens the ventral cord with mild canal stenosis, similar to prior. At T8-T9, disc bulging and endplate spurring results in mild canal stenosis, similar. At T9-T10, evaluation is limited by metallic artifact; however, posterior disc bulging appears to result in mild to moderate canal stenosis, possibly progressed from the prior but poorly characterized. Interval extension of the patient's lumbar fusion superiorly to T10. At T10-T11, the canal appears to be mild to moderately moderately narrowed, similar versus slightly progressed. At T11-T12 the canal appears to be patent. MRI LUMBAR SPINE FINDINGS Motion limited evaluation.  Within this limitation: Segmentation:  Standard. Alignment:  Normal. Vertebrae:  Interval posterior fusion spanning from T10 to S1. Conus medullaris and cauda equina: Conus extends to the L1-L2 level. Conus appears normal. Paraspinal and other soft tissues: Postoperative edema in the paraspinal soft tissues with fluid collection in the paraspinal soft tissues measuring up to 8 x 3 x 63 mm, centered at T12-L1. There is also nonspecific fluid surrounding the posterior elements bilaterally at L2-L3. Disc levels: T12-L1: Posterior fusion. Mild canal stenosis. Limited evaluation of foramina due to metallic artifact. L1-L2: Posterior fusion and decompression. Improved canal stenosis with patent canal. Limited evaluation of the foramina due to metallic artifact with visualized portions appearing patent. L2-L3: Prior posterior decompression and fusion. Osteophytic ridging. Facet hypertrophy. Mild canal stenosis, similar. Limited evaluation of foramina with likely mild bilateral foraminal stenosis. L3-L4: Prior posterior fusion and  decompression. Patent canal and foramina. L4-L5: Prior posterior fusion and decompression. Patent canal with probably mild bilateral foraminal stenosis. L5-S1: Prior posterior fusion and decompression. Patent canal with mild left foraminal stenosis. IMPRESSION: MRI cervical spine: 1. Similar moderate canal stenosis at C4-C5 and C6-C7 2. Similar severe foraminal stenosis on the right at C4-C5 and moderate to severe foraminal stenosis on the left at C3-C4. 3. 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 4. Nonspecific ill-defined edema in the posterior paraspinal soft tissues, which could represent strain or cellulitis. MRI thoracic and lumbar spine: 1. Status post extension of lumbar fusion superiorly to T10. Nonspecific postoperative edema and fluid collection in the posterior paraspinal soft tissues centered at T12, which could represent a seroma but abscess is not excluded given reported purulent drainage. There also is nonspecific fluid surrounding the posterior elements at L2-L3. 2. Mild edema within the discs at T10-T11 and T12-L1, which could be degenerative or stress related but early discitis is difficult to exclude given the clinical history. No clearly infectious findings within the canal, although evaluation is limited by metallic artifact and motion. If there is clinical concern for infection, consider correlation with inflammatory markers and close interval follow-up MRI with contrast as clinically indicated. 3. Improved canal stenosis and foraminal stenosis at L1-L2 status post decompression and fusion. 4. Mild to moderate canal stenosis at T9-T10 and T10-T11 appears similar versus slightly progressed. Similar mild canal stenosis at T7-T8. Electronically Signed   By: Margaretha Sheffield M.D.   On: 11/26/2020 16:39   MR THORACIC SPINE W WO CONTRAST  Result Date: 11/26/2020 CLINICAL DATA:  Spinal fusion,  thoracic, follow up Epidural abscess; Spinal fusion, lumbosacral, follow up EXAM: MRI CERVICAL, THORACIC AND LUMBAR SPINE WITHOUT AND WITH CONTRAST TECHNIQUE: Multiplanar and multiecho pulse sequences of the cervical spine, to include the craniocervical junction and cervicothoracic junction, and thoracic and lumbar spine, were obtained without and with intravenous  contrast. CONTRAST:  67mL GADAVIST GADOBUTROL 1 MMOL/ML IV SOLN COMPARISON:  CT myelogram September 03, 2020. FINDINGS: MRI CERVICAL SPINE FINDINGS Significantly motion limited study.  Within this limitation: Alignment: Similar alignment with reversal of the normal cervical lordosis and grade 1 anterolisthesis of C3 on C4, C4 on C5, and C6 on C7. Vertebrae: Motion limited evaluation. Degenerative/discogenic endplate signal changes at multiple levels. Otherwise, no obvious marrow edema to suggest acute fracture or discitis/osteomyelitis. Cord: Motion limited evaluation without definite cord signal abnormality. Posterior Fossa, vertebral arteries, paraspinal tissues: Visualized vertebral artery flow voids are maintained. No acute findings in the visualized posterior fossa. There is nonspecific ill-defined edema in the posterior paraspinal soft tissues. Disc levels: C2-C3: No significant disc protrusion, foraminal stenosis, or canal stenosis. C3-C4: Posterior disc osteophyte complex with left greater than right facet and uncovertebral hypertrophy. Resulting moderate to severe left foraminal stenosis and mild canal stenosis. C4-C5: Posterior disc osteophyte complex with right greater than left facet and uncovertebral hypertrophy. Resulting severe right foraminal stenosis and moderate canal stenosis. Flattening of the ventral cord. C5-C6: Posterior disc osteophyte complex with mild bilateral facet and uncovertebral hypertrophy. Mild canal stenosis without significant foraminal stenosis. C6-C7: Posterior disc osteophyte complex with central inferiorly dissecting disc  protrusion versus calcified PLL (as seen on prior myelogram). Resulting moderate canal stenosis. Bilateral facet and uncovertebral hypertrophy with mild to moderate bilateral foraminal stenosis. C7-T1: Posterior disc osteophyte complex with left greater than right facet and uncovertebral hypertrophy. Central disc protrusion with inferior extension. Resulting mild-to-moderate canal stenosis and mild bilateral foraminal stenosis. MRI THORACIC SPINE FINDINGS Alignment:  Normal. Vertebrae: Interval posterior fusion spanning from T10 inferiorly into the lumbar spine. Nonspecific mild edema within the disc spaces at T10-T11, T11-T12, and T12-L1. Cord:  Normal cord signal. Paraspinal and other soft tissues: Postoperative fluid collection in the posterior paraspinal soft tissues in the lower thoracic spine and lumbar spine (detailed further below). Disc levels: Mild Mild disc bulging at multiple upper to mid thoracic levels. At T7-T8 right paracentral disc protrusion flattens the ventral cord with mild canal stenosis, similar to prior. At T8-T9, disc bulging and endplate spurring results in mild canal stenosis, similar. At T9-T10, evaluation is limited by metallic artifact; however, posterior disc bulging appears to result in mild to moderate canal stenosis, possibly progressed from the prior but poorly characterized. Interval extension of the patient's lumbar fusion superiorly to T10. At T10-T11, the canal appears to be mild to moderately moderately narrowed, similar versus slightly progressed. At T11-T12 the canal appears to be patent. MRI LUMBAR SPINE FINDINGS Motion limited evaluation.  Within this limitation: Segmentation:  Standard. Alignment:  Normal. Vertebrae:  Interval posterior fusion spanning from T10 to S1. Conus medullaris and cauda equina: Conus extends to the L1-L2 level. Conus appears normal. Paraspinal and other soft tissues: Postoperative edema in the paraspinal soft tissues with fluid collection in the  paraspinal soft tissues measuring up to 8 x 3 x 63 mm, centered at T12-L1. There is also nonspecific fluid surrounding the posterior elements bilaterally at L2-L3. Disc levels: T12-L1: Posterior fusion. Mild canal stenosis. Limited evaluation of foramina due to metallic artifact. L1-L2: Posterior fusion and decompression. Improved canal stenosis with patent canal. Limited evaluation of the foramina due to metallic artifact with visualized portions appearing patent. L2-L3: Prior posterior decompression and fusion. Osteophytic ridging. Facet hypertrophy. Mild canal stenosis, similar. Limited evaluation of foramina with likely mild bilateral foraminal stenosis. L3-L4: Prior posterior fusion and decompression. Patent canal and foramina. L4-L5: Prior posterior fusion and  decompression. Patent canal with probably mild bilateral foraminal stenosis. L5-S1: Prior posterior fusion and decompression. Patent canal with mild left foraminal stenosis. IMPRESSION: MRI cervical spine: 1. Similar moderate canal stenosis at C4-C5 and C6-C7 2. Similar severe foraminal stenosis on the right at C4-C5 and moderate to severe foraminal stenosis on the left at C3-C4. 3. 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 4. Nonspecific ill-defined edema in the posterior paraspinal soft tissues, which could represent strain or cellulitis. MRI thoracic and lumbar spine: 1. Status post extension of lumbar fusion superiorly to T10. Nonspecific postoperative edema and fluid collection in the posterior paraspinal soft tissues centered at T12, which could represent a seroma but abscess is not excluded given reported purulent drainage. There also is nonspecific fluid surrounding the posterior elements at L2-L3. 2. Mild edema within the discs at T10-T11 and T12-L1, which could be degenerative or stress related but early discitis is difficult to exclude given the  clinical history. No clearly infectious findings within the canal, although evaluation is limited by metallic artifact and motion. If there is clinical concern for infection, consider correlation with inflammatory markers and close interval follow-up MRI with contrast as clinically indicated. 3. Improved canal stenosis and foraminal stenosis at L1-L2 status post decompression and fusion. 4. Mild to moderate canal stenosis at T9-T10 and T10-T11 appears similar versus slightly progressed. Similar mild canal stenosis at T7-T8. Electronically Signed   By: Margaretha Sheffield M.D.   On: 11/26/2020 16:39   MR Lumbar Spine W Wo Contrast  Result Date: 11/26/2020 CLINICAL DATA:  Spinal fusion, thoracic, follow up Epidural abscess; Spinal fusion, lumbosacral, follow up EXAM: MRI CERVICAL, THORACIC AND LUMBAR SPINE WITHOUT AND WITH CONTRAST TECHNIQUE: Multiplanar and multiecho pulse sequences of the cervical spine, to include the craniocervical junction and cervicothoracic junction, and thoracic and lumbar spine, were obtained without and with intravenous contrast. CONTRAST:  86mL GADAVIST GADOBUTROL 1 MMOL/ML IV SOLN COMPARISON:  CT myelogram September 03, 2020. FINDINGS: MRI CERVICAL SPINE FINDINGS Significantly motion limited study.  Within this limitation: Alignment: Similar alignment with reversal of the normal cervical lordosis and grade 1 anterolisthesis of C3 on C4, C4 on C5, and C6 on C7. Vertebrae: Motion limited evaluation. Degenerative/discogenic endplate signal changes at multiple levels. Otherwise, no obvious marrow edema to suggest acute fracture or discitis/osteomyelitis. Cord: Motion limited evaluation without definite cord signal abnormality. Posterior Fossa, vertebral arteries, paraspinal tissues: Visualized vertebral artery flow voids are maintained. No acute findings in the visualized posterior fossa. There is nonspecific ill-defined edema in the posterior paraspinal soft tissues. Disc levels: C2-C3: No  significant disc protrusion, foraminal stenosis, or canal stenosis. C3-C4: Posterior disc osteophyte complex with left greater than right facet and uncovertebral hypertrophy. Resulting moderate to severe left foraminal stenosis and mild canal stenosis. C4-C5: Posterior disc osteophyte complex with right greater than left facet and uncovertebral hypertrophy. Resulting severe right foraminal stenosis and moderate canal stenosis. Flattening of the ventral cord. C5-C6: Posterior disc osteophyte complex with mild bilateral facet and uncovertebral hypertrophy. Mild canal stenosis without significant foraminal stenosis. C6-C7: Posterior disc osteophyte complex with central inferiorly dissecting disc protrusion versus calcified PLL (as seen on prior myelogram). Resulting moderate canal stenosis. Bilateral facet and uncovertebral hypertrophy with mild to moderate bilateral foraminal stenosis. C7-T1: Posterior disc osteophyte complex with left greater than right facet and uncovertebral hypertrophy. Central disc protrusion with inferior extension. Resulting mild-to-moderate canal stenosis and mild bilateral foraminal stenosis. MRI THORACIC SPINE  FINDINGS Alignment:  Normal. Vertebrae: Interval posterior fusion spanning from T10 inferiorly into the lumbar spine. Nonspecific mild edema within the disc spaces at T10-T11, T11-T12, and T12-L1. Cord:  Normal cord signal. Paraspinal and other soft tissues: Postoperative fluid collection in the posterior paraspinal soft tissues in the lower thoracic spine and lumbar spine (detailed further below). Disc levels: Mild Mild disc bulging at multiple upper to mid thoracic levels. At T7-T8 right paracentral disc protrusion flattens the ventral cord with mild canal stenosis, similar to prior. At T8-T9, disc bulging and endplate spurring results in mild canal stenosis, similar. At T9-T10, evaluation is limited by metallic artifact; however, posterior disc bulging appears to result in mild to  moderate canal stenosis, possibly progressed from the prior but poorly characterized. Interval extension of the patient's lumbar fusion superiorly to T10. At T10-T11, the canal appears to be mild to moderately moderately narrowed, similar versus slightly progressed. At T11-T12 the canal appears to be patent. MRI LUMBAR SPINE FINDINGS Motion limited evaluation.  Within this limitation: Segmentation:  Standard. Alignment:  Normal. Vertebrae:  Interval posterior fusion spanning from T10 to S1. Conus medullaris and cauda equina: Conus extends to the L1-L2 level. Conus appears normal. Paraspinal and other soft tissues: Postoperative edema in the paraspinal soft tissues with fluid collection in the paraspinal soft tissues measuring up to 8 x 3 x 63 mm, centered at T12-L1. There is also nonspecific fluid surrounding the posterior elements bilaterally at L2-L3. Disc levels: T12-L1: Posterior fusion. Mild canal stenosis. Limited evaluation of foramina due to metallic artifact. L1-L2: Posterior fusion and decompression. Improved canal stenosis with patent canal. Limited evaluation of the foramina due to metallic artifact with visualized portions appearing patent. L2-L3: Prior posterior decompression and fusion. Osteophytic ridging. Facet hypertrophy. Mild canal stenosis, similar. Limited evaluation of foramina with likely mild bilateral foraminal stenosis. L3-L4: Prior posterior fusion and decompression. Patent canal and foramina. L4-L5: Prior posterior fusion and decompression. Patent canal with probably mild bilateral foraminal stenosis. L5-S1: Prior posterior fusion and decompression. Patent canal with mild left foraminal stenosis. IMPRESSION: MRI cervical spine: 1. Similar moderate canal stenosis at C4-C5 and C6-C7 2. Similar severe foraminal stenosis on the right at C4-C5 and moderate to severe foraminal stenosis on the left at C3-C4. 3. Similar inferiorly dissecting disc protrusion versus ossified PLL behind the C7  vertebral body with mild-to-moderate canal stenosis at C7-T1. 4. Nonspecific ill-defined edema in the posterior paraspinal soft tissues, which could represent strain or cellulitis. MRI thoracic and lumbar spine: 1. Status post extension of lumbar fusion superiorly to T10. Nonspecific postoperative edema and fluid collection in the posterior paraspinal soft tissues centered at T12, which could represent a seroma but abscess is not excluded given reported purulent drainage. There also is nonspecific fluid surrounding the posterior elements at L2-L3. 2. Mild edema within the discs at T10-T11 and T12-L1, which could be degenerative or stress related but early discitis is difficult to exclude given the clinical history. No clearly infectious findings within the canal, although evaluation is limited by metallic artifact and motion. If there is clinical concern for infection, consider correlation with inflammatory markers and close interval follow-up MRI with contrast as clinically indicated. 3. Improved canal stenosis and foraminal stenosis at L1-L2 status post decompression and fusion. 4. Mild to moderate canal stenosis at T9-T10 and T10-T11 appears similar versus slightly progressed. Similar mild canal stenosis at T7-T8. Electronically Signed   By: Margaretha Sheffield M.D.   On: 11/26/2020 16:39   US Abdomen Limited  Result  Date: 11/27/2020 CLINICAL DATA:  Elevated LFTs and abdominal pain EXAM: ULTRASOUND ABDOMEN LIMITED RIGHT UPPER QUADRANT COMPARISON:  11/23/2020 CT FINDINGS: Gallbladder: No gallstones or wall thickening visualized. No sonographic Murphy sign noted by sonographer. Common bile duct: Diameter: 4.36 mm Liver: Hypodensity seen on recent CT examination is not well appreciated on today's exam. Portal vein is patent on color Doppler imaging with normal direction of blood flow towards the liver. Other: Minimal free fluid is noted in the right upper quadrant. Right-sided pleural effusion is noted.  IMPRESSION: Minimal free fluid in the abdomen Small right-sided pleural effusion. No other focal abnormality is noted. Electronically Signed   By: Inez Catalina M.D.   On: 11/27/2020 22:11   DG Chest Port 1V same Day  Result Date: 11/27/2020 CLINICAL DATA:  Dyspnea. EXAM: PORTABLE CHEST 1 VIEW COMPARISON:  11/25/2020 FINDINGS: There is a nasogastric tube with tip below the GE junction. Stable cardiomediastinal contours. Low lung volumes with asymmetric elevation of the right hemidiaphragm. No airspace opacities. No pleural effusion or edema. IMPRESSION: Low lung volumes.  No acute findings. Electronically Signed   By: Kerby Moors M.D.   On: 11/27/2020 09:01   DG Abd Portable 1V  Result Date: 11/27/2020 CLINICAL DATA:  Follow-up ileus EXAM: PORTABLE ABDOMEN - 1 VIEW COMPARISON:  11/26/2020 FINDINGS: Similar ileus pattern with gas in small and large bowel. Nasogastric tube enters the stomach. Previously seen rectal tube has been removed. No finding to suggest free air on the supine images. IMPRESSION: Persistent similar ileus pattern. Electronically Signed   By: Nelson Chimes M.D.   On: 11/27/2020 13:43    Assessment/Plan: Postop I&D of lumbar wound being treated for sepsis infection ileus.  Patient seems to be improving continue IV antibiotics appreciate gastroenterology and ID assistance.  LOS: 4 days     Elaina Hoops 11/28/2020, 11:47 AM

## 2020-11-28 NOTE — Anesthesia Postprocedure Evaluation (Signed)
Anesthesia Post Note  Patient: Cline Draheim  Procedure(s) Performed: LUMBAR WOUND DEBRIDEMENT AND WASHOUT APPLICATION OF WOUND VAC     Patient location during evaluation: PACU Anesthesia Type: General Level of consciousness: sedated and patient cooperative Pain management: pain level controlled Vital Signs Assessment: post-procedure vital signs reviewed and stable Respiratory status: spontaneous breathing Cardiovascular status: stable Anesthetic complications: no Comments: Discussed with CCM MD and PA the continued relative instability of the patient. HR being better controlled than when he arrived to preop. I also spoke to a second CCM PA to inform them that the patient did not receive a non-triggering anesthetic.   No notable events documented.  Last Vitals:  Vitals:   11/28/20 1950 11/28/20 2000  BP:    Pulse:    Resp:    Temp:  36.9 C  SpO2: 92%     Last Pain:  Vitals:   11/28/20 2000  TempSrc: Axillary  PainSc:                  Nolon Nations

## 2020-11-28 NOTE — Progress Notes (Signed)
Patient ID: Danny Hampton, adult   DOB: Oct 22, 1955, 65 y.o.   MRN: [id number]  [facility] Surgery Progress Note  1 Day Post-Op  Subjective: CC-  Abdomen feels the same, no better no worse. Continues to have bloating and diffuse abdominal pain. No flatus or BM. NG tube output not recorded. It's currently clamped for medication administration.   Objective: Vital signs in last 24 hours: Temp:  [97.8 F (36.6 C)-98.2 F (36.8 C)] 97.8 F (36.6 C) (09/21 0700) Pulse Rate:  [42-168] 137 (09/21 0815) Resp:  [11-30] 27 (09/21 0815) BP: (91-143)/(58-108) 132/87 (09/21 0800) SpO2:  [79 %-100 %] 100 % (09/21 0815) Arterial Line BP: (138-178)/(69-89) 178/83 (09/21 0815) Weight:  [94.2 kg] 94.2 kg (09/21 0500) Last BM Date:  (pta)  Intake/Output from previous day: 09/20 0701 - 09/21 0700 In: 3923.9 [I.V.:3758.3; IV Piggyback:165.7] Out: 2040 [Urine:2025; Blood:15] Intake/Output this shift: No intake/output data recorded.  PE: Gen:  Alert, NAD, pleasant Card:  tachy Pulm:  rate and effort normal Abd: distended but soft, diffuse tenderness without rebound or guarding, hypoactive BS, no masses, hernias, or organomegaly   Lab Results:  Recent Labs    11/27/20 0348 11/28/20 0418  WBC 5.8 5.8  HGB 10.0* 9.6*  HCT 28.7* 28.4*  PLT 77* 97*   BMET Recent Labs    11/27/20 0348 11/28/20 0418  NA 130* 135  K 4.4 3.7  CL 95* 97*  CO2 21* 22  GLUCOSE 128* 181*  BUN 23 16  CREATININE 1.05 1.08  CALCIUM 8.6* 8.4*   PT/INR No results for input(s): LABPROT, INR in the last 72 hours. CMP     Component Value Date/Time   NA 135 11/28/2020 0418   K 3.7 11/28/2020 0418   CL 97 (L) 11/28/2020 0418   CO2 22 11/28/2020 0418   GLUCOSE 181 (H) 11/28/2020 0418   BUN 16 11/28/2020 0418   CREATININE 1.08 11/28/2020 0418   CALCIUM 8.4 (L) 11/28/2020 0418   PROT 5.8 (L) 11/28/2020 0418   ALBUMIN 2.3 (L) 11/28/2020 0418   AST 296 (H) 11/28/2020 0418   ALT 103 (H) 11/28/2020  0418   ALKPHOS 53 11/28/2020 0418   BILITOT 1.7 (H) 11/28/2020 0418   GFRNONAA >60 11/28/2020 0418   GFRAA >60 01/27/2019 1014   Lipase     Component Value Date/Time   LIPASE 48 11/27/2020 0851       Studies/Results: MR CERVICAL SPINE WO CONTRAST  Result Date: 11/26/2020 CLINICAL DATA:  Spinal fusion, thoracic, follow up Epidural abscess; Spinal fusion, lumbosacral, follow up EXAM: MRI CERVICAL, THORACIC AND LUMBAR SPINE WITHOUT AND WITH CONTRAST TECHNIQUE: Multiplanar and multiecho pulse sequences of the cervical spine, to include the craniocervical junction and cervicothoracic junction, and thoracic and lumbar spine, were obtained without and with intravenous contrast. CONTRAST:  17mL GADAVIST GADOBUTROL 1 MMOL/ML IV SOLN COMPARISON:  CT myelogram September 03, 2020. FINDINGS: MRI CERVICAL SPINE FINDINGS Significantly motion limited study.  Within this limitation: Alignment: Similar alignment with reversal of the normal cervical lordosis and grade 1 anterolisthesis of C3 on C4, C4 on C5, and C6 on C7. Vertebrae: Motion limited evaluation. Degenerative/discogenic endplate signal changes at multiple levels. Otherwise, no obvious marrow edema to suggest acute fracture or discitis/osteomyelitis. Cord: Motion limited evaluation without definite cord signal abnormality. Posterior Fossa, vertebral arteries, paraspinal tissues: Visualized vertebral artery flow voids are maintained. No acute findings in the visualized posterior fossa. There is nonspecific ill-defined edema in the posterior paraspinal soft tissues. Disc levels: C2-C3:  No significant disc protrusion, foraminal stenosis, or canal stenosis. C3-C4: Posterior disc osteophyte complex with left greater than right facet and uncovertebral hypertrophy. Resulting moderate to severe left foraminal stenosis and mild canal stenosis. C4-C5: Posterior disc osteophyte complex with right greater than left facet and uncovertebral hypertrophy. Resulting severe  right foraminal stenosis and moderate canal stenosis. Flattening of the ventral cord. C5-C6: Posterior disc osteophyte complex with mild bilateral facet and uncovertebral hypertrophy. Mild canal stenosis without significant foraminal stenosis. C6-C7: Posterior disc osteophyte complex with central inferiorly dissecting disc protrusion versus calcified PLL (as seen on prior myelogram). Resulting moderate canal stenosis. Bilateral facet and uncovertebral hypertrophy with mild to moderate bilateral foraminal stenosis. C7-T1: Posterior disc osteophyte complex with left greater than right facet and uncovertebral hypertrophy. Central disc protrusion with inferior extension. Resulting mild-to-moderate canal stenosis and mild bilateral foraminal stenosis. MRI THORACIC SPINE FINDINGS Alignment:  Normal. Vertebrae: Interval posterior fusion spanning from T10 inferiorly into the lumbar spine. Nonspecific mild edema within the disc spaces at T10-T11, T11-T12, and T12-L1. Cord:  Normal cord signal. Paraspinal and other soft tissues: Postoperative fluid collection in the posterior paraspinal soft tissues in the lower thoracic spine and lumbar spine (detailed further below). Disc levels: Mild Mild disc bulging at multiple upper to mid thoracic levels. At T7-T8 right paracentral disc protrusion flattens the ventral cord with mild canal stenosis, similar to prior. At T8-T9, disc bulging and endplate spurring results in mild canal stenosis, similar. At T9-T10, evaluation is limited by metallic artifact; however, posterior disc bulging appears to result in mild to moderate canal stenosis, possibly progressed from the prior but poorly characterized. Interval extension of the patient's lumbar fusion superiorly to T10. At T10-T11, the canal appears to be mild to moderately moderately narrowed, similar versus slightly progressed. At T11-T12 the canal appears to be patent. MRI LUMBAR SPINE FINDINGS Motion limited evaluation.  Within this  limitation: Segmentation:  Standard. Alignment:  Normal. Vertebrae:  Interval posterior fusion spanning from T10 to S1. Conus medullaris and cauda equina: Conus extends to the L1-L2 level. Conus appears normal. Paraspinal and other soft tissues: Postoperative edema in the paraspinal soft tissues with fluid collection in the paraspinal soft tissues measuring up to 8 x 3 x 63 mm, centered at T12-L1. There is also nonspecific fluid surrounding the posterior elements bilaterally at L2-L3. Disc levels: T12-L1: Posterior fusion. Mild canal stenosis. Limited evaluation of foramina due to metallic artifact. L1-L2: Posterior fusion and decompression. Improved canal stenosis with patent canal. Limited evaluation of the foramina due to metallic artifact with visualized portions appearing patent. L2-L3: Prior posterior decompression and fusion. Osteophytic ridging. Facet hypertrophy. Mild canal stenosis, similar. Limited evaluation of foramina with likely mild bilateral foraminal stenosis. L3-L4: Prior posterior fusion and decompression. Patent canal and foramina. L4-L5: Prior posterior fusion and decompression. Patent canal with probably mild bilateral foraminal stenosis. L5-S1: Prior posterior fusion and decompression. Patent canal with mild left foraminal stenosis. IMPRESSION: MRI cervical spine: 1. Similar moderate canal stenosis at C4-C5 and C6-C7 2. Similar severe foraminal stenosis on the right at C4-C5 and moderate to severe foraminal stenosis on the left at C3-C4. 3. 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 4. Nonspecific ill-defined edema in the posterior paraspinal soft tissues, which could represent strain or cellulitis. MRI thoracic and lumbar spine: 1. Status post extension of lumbar fusion superiorly to T10. Nonspecific postoperative edema and fluid collection in the posterior paraspinal soft tissues centered at  T12, which could represent a seroma but abscess is not excluded given reported purulent drainage. There also is nonspecific fluid surrounding the posterior elements at L2-L3. 2. Mild edema within the discs at T10-T11 and T12-L1, which could be degenerative or stress related but early discitis is difficult to exclude given the clinical history. No clearly infectious findings within the canal, although evaluation is limited by metallic artifact and motion. If there is clinical concern for infection, consider correlation with inflammatory markers and close interval follow-up MRI with contrast as clinically indicated. 3. Improved canal stenosis and foraminal stenosis at L1-L2 status post decompression and fusion. 4. Mild to moderate canal stenosis at T9-T10 and T10-T11 appears similar versus slightly progressed. Similar mild canal stenosis at T7-T8. Electronically Signed   By: Margaretha Sheffield M.D.   On: 11/26/2020 16:39   MR THORACIC SPINE W WO CONTRAST  Result Date: 11/26/2020 CLINICAL DATA:  Spinal fusion, thoracic, follow up Epidural abscess; Spinal fusion, lumbosacral, follow up EXAM: MRI CERVICAL, THORACIC AND LUMBAR SPINE WITHOUT AND WITH CONTRAST TECHNIQUE: Multiplanar and multiecho pulse sequences of the cervical spine, to include the craniocervical junction and cervicothoracic junction, and thoracic and lumbar spine, were obtained without and with intravenous contrast. CONTRAST:  58mL GADAVIST GADOBUTROL 1 MMOL/ML IV SOLN COMPARISON:  CT myelogram September 03, 2020. FINDINGS: MRI CERVICAL SPINE FINDINGS Significantly motion limited study.  Within this limitation: Alignment: Similar alignment with reversal of the normal cervical lordosis and grade 1 anterolisthesis of C3 on C4, C4 on C5, and C6 on C7. Vertebrae: Motion limited evaluation. Degenerative/discogenic endplate signal changes at multiple levels. Otherwise, no obvious marrow edema to suggest acute fracture or discitis/osteomyelitis. Cord: Motion  limited evaluation without definite cord signal abnormality. Posterior Fossa, vertebral arteries, paraspinal tissues: Visualized vertebral artery flow voids are maintained. No acute findings in the visualized posterior fossa. There is nonspecific ill-defined edema in the posterior paraspinal soft tissues. Disc levels: C2-C3: No significant disc protrusion, foraminal stenosis, or canal stenosis. C3-C4: Posterior disc osteophyte complex with left greater than right facet and uncovertebral hypertrophy. Resulting moderate to severe left foraminal stenosis and mild canal stenosis. C4-C5: Posterior disc osteophyte complex with right greater than left facet and uncovertebral hypertrophy. Resulting severe right foraminal stenosis and moderate canal stenosis. Flattening of the ventral cord. C5-C6: Posterior disc osteophyte complex with mild bilateral facet and uncovertebral hypertrophy. Mild canal stenosis without significant foraminal stenosis. C6-C7: Posterior disc osteophyte complex with central inferiorly dissecting disc protrusion versus calcified PLL (as seen on prior myelogram). Resulting moderate canal stenosis. Bilateral facet and uncovertebral hypertrophy with mild to moderate bilateral foraminal stenosis. C7-T1: Posterior disc osteophyte complex with left greater than right facet and uncovertebral hypertrophy. Central disc protrusion with inferior extension. Resulting mild-to-moderate canal stenosis and mild bilateral foraminal stenosis. MRI THORACIC SPINE FINDINGS Alignment:  Normal. Vertebrae: Interval posterior fusion spanning from T10 inferiorly into the lumbar spine. Nonspecific mild edema within the disc spaces at T10-T11, T11-T12, and T12-L1. Cord:  Normal cord signal. Paraspinal and other soft tissues: Postoperative fluid collection in the posterior paraspinal soft tissues in the lower thoracic spine and lumbar spine (detailed further below). Disc levels: Mild Mild disc bulging at multiple upper to mid  thoracic levels. At T7-T8 right paracentral disc protrusion flattens the ventral cord with mild canal stenosis, similar to prior. At T8-T9, disc bulging and endplate spurring results in mild canal stenosis, similar. At T9-T10, evaluation is limited by metallic artifact; however, posterior disc bulging appears to result in mild to moderate canal  stenosis, possibly progressed from the prior but poorly characterized. Interval extension of the patient's lumbar fusion superiorly to T10. At T10-T11, the canal appears to be mild to moderately moderately narrowed, similar versus slightly progressed. At T11-T12 the canal appears to be patent. MRI LUMBAR SPINE FINDINGS Motion limited evaluation.  Within this limitation: Segmentation:  Standard. Alignment:  Normal. Vertebrae:  Interval posterior fusion spanning from T10 to S1. Conus medullaris and cauda equina: Conus extends to the L1-L2 level. Conus appears normal. Paraspinal and other soft tissues: Postoperative edema in the paraspinal soft tissues with fluid collection in the paraspinal soft tissues measuring up to 8 x 3 x 63 mm, centered at T12-L1. There is also nonspecific fluid surrounding the posterior elements bilaterally at L2-L3. Disc levels: T12-L1: Posterior fusion. Mild canal stenosis. Limited evaluation of foramina due to metallic artifact. L1-L2: Posterior fusion and decompression. Improved canal stenosis with patent canal. Limited evaluation of the foramina due to metallic artifact with visualized portions appearing patent. L2-L3: Prior posterior decompression and fusion. Osteophytic ridging. Facet hypertrophy. Mild canal stenosis, similar. Limited evaluation of foramina with likely mild bilateral foraminal stenosis. L3-L4: Prior posterior fusion and decompression. Patent canal and foramina. L4-L5: Prior posterior fusion and decompression. Patent canal with probably mild bilateral foraminal stenosis. L5-S1: Prior posterior fusion and decompression. Patent canal  with mild left foraminal stenosis. IMPRESSION: MRI cervical spine: 1. Similar moderate canal stenosis at C4-C5 and C6-C7 2. Similar severe foraminal stenosis on the right at C4-C5 and moderate to severe foraminal stenosis on the left at C3-C4. 3. 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 4. Nonspecific ill-defined edema in the posterior paraspinal soft tissues, which could represent strain or cellulitis. MRI thoracic and lumbar spine: 1. Status post extension of lumbar fusion superiorly to T10. Nonspecific postoperative edema and fluid collection in the posterior paraspinal soft tissues centered at T12, which could represent a seroma but abscess is not excluded given reported purulent drainage. There also is nonspecific fluid surrounding the posterior elements at L2-L3. 2. Mild edema within the discs at T10-T11 and T12-L1, which could be degenerative or stress related but early discitis is difficult to exclude given the clinical history. No clearly infectious findings within the canal, although evaluation is limited by metallic artifact and motion. If there is clinical concern for infection, consider correlation with inflammatory markers and close interval follow-up MRI with contrast as clinically indicated. 3. Improved canal stenosis and foraminal stenosis at L1-L2 status post decompression and fusion. 4. Mild to moderate canal stenosis at T9-T10 and T10-T11 appears similar versus slightly progressed. Similar mild canal stenosis at T7-T8. Electronically Signed   By: Margaretha Sheffield M.D.   On: 11/26/2020 16:39   MR Lumbar Spine W Wo Contrast  Result Date: 11/26/2020 CLINICAL DATA:  Spinal fusion, thoracic, follow up Epidural abscess; Spinal fusion, lumbosacral, follow up EXAM: MRI CERVICAL, THORACIC AND LUMBAR SPINE WITHOUT AND WITH CONTRAST TECHNIQUE: Multiplanar and multiecho pulse sequences of the cervical  spine, to include the craniocervical junction and cervicothoracic junction, and thoracic and lumbar spine, were obtained without and with intravenous contrast. CONTRAST:  16mL GADAVIST GADOBUTROL 1 MMOL/ML IV SOLN COMPARISON:  CT myelogram September 03, 2020. FINDINGS: MRI CERVICAL SPINE FINDINGS Significantly motion limited study.  Within this limitation: Alignment: Similar alignment with reversal of the normal cervical lordosis and grade 1 anterolisthesis of C3 on C4, C4 on C5, and C6 on C7. Vertebrae: Motion limited evaluation. Degenerative/discogenic endplate  signal changes at multiple levels. Otherwise, no obvious marrow edema to suggest acute fracture or discitis/osteomyelitis. Cord: Motion limited evaluation without definite cord signal abnormality. Posterior Fossa, vertebral arteries, paraspinal tissues: Visualized vertebral artery flow voids are maintained. No acute findings in the visualized posterior fossa. There is nonspecific ill-defined edema in the posterior paraspinal soft tissues. Disc levels: C2-C3: No significant disc protrusion, foraminal stenosis, or canal stenosis. C3-C4: Posterior disc osteophyte complex with left greater than right facet and uncovertebral hypertrophy. Resulting moderate to severe left foraminal stenosis and mild canal stenosis. C4-C5: Posterior disc osteophyte complex with right greater than left facet and uncovertebral hypertrophy. Resulting severe right foraminal stenosis and moderate canal stenosis. Flattening of the ventral cord. C5-C6: Posterior disc osteophyte complex with mild bilateral facet and uncovertebral hypertrophy. Mild canal stenosis without significant foraminal stenosis. C6-C7: Posterior disc osteophyte complex with central inferiorly dissecting disc protrusion versus calcified PLL (as seen on prior myelogram). Resulting moderate canal stenosis. Bilateral facet and uncovertebral hypertrophy with mild to moderate bilateral foraminal stenosis. C7-T1: Posterior disc  osteophyte complex with left greater than right facet and uncovertebral hypertrophy. Central disc protrusion with inferior extension. Resulting mild-to-moderate canal stenosis and mild bilateral foraminal stenosis. MRI THORACIC SPINE FINDINGS Alignment:  Normal. Vertebrae: Interval posterior fusion spanning from T10 inferiorly into the lumbar spine. Nonspecific mild edema within the disc spaces at T10-T11, T11-T12, and T12-L1. Cord:  Normal cord signal. Paraspinal and other soft tissues: Postoperative fluid collection in the posterior paraspinal soft tissues in the lower thoracic spine and lumbar spine (detailed further below). Disc levels: Mild Mild disc bulging at multiple upper to mid thoracic levels. At T7-T8 right paracentral disc protrusion flattens the ventral cord with mild canal stenosis, similar to prior. At T8-T9, disc bulging and endplate spurring results in mild canal stenosis, similar. At T9-T10, evaluation is limited by metallic artifact; however, posterior disc bulging appears to result in mild to moderate canal stenosis, possibly progressed from the prior but poorly characterized. Interval extension of the patient's lumbar fusion superiorly to T10. At T10-T11, the canal appears to be mild to moderately moderately narrowed, similar versus slightly progressed. At T11-T12 the canal appears to be patent. MRI LUMBAR SPINE FINDINGS Motion limited evaluation.  Within this limitation: Segmentation:  Standard. Alignment:  Normal. Vertebrae:  Interval posterior fusion spanning from T10 to S1. Conus medullaris and cauda equina: Conus extends to the L1-L2 level. Conus appears normal. Paraspinal and other soft tissues: Postoperative edema in the paraspinal soft tissues with fluid collection in the paraspinal soft tissues measuring up to 8 x 3 x 63 mm, centered at T12-L1. There is also nonspecific fluid surrounding the posterior elements bilaterally at L2-L3. Disc levels: T12-L1: Posterior fusion. Mild canal  stenosis. Limited evaluation of foramina due to metallic artifact. L1-L2: Posterior fusion and decompression. Improved canal stenosis with patent canal. Limited evaluation of the foramina due to metallic artifact with visualized portions appearing patent. L2-L3: Prior posterior decompression and fusion. Osteophytic ridging. Facet hypertrophy. Mild canal stenosis, similar. Limited evaluation of foramina with likely mild bilateral foraminal stenosis. L3-L4: Prior posterior fusion and decompression. Patent canal and foramina. L4-L5: Prior posterior fusion and decompression. Patent canal with probably mild bilateral foraminal stenosis. L5-S1: Prior posterior fusion and decompression. Patent canal with mild left foraminal stenosis. IMPRESSION: MRI cervical spine: 1. Similar moderate canal stenosis at C4-C5 and C6-C7 2. Similar severe foraminal stenosis on the right at C4-C5 and moderate to severe foraminal stenosis on the left at C3-C4. 3. Similar inferiorly dissecting disc  protrusion versus ossified PLL behind the C7 vertebral body with mild-to-moderate canal stenosis at C7-T1. 4. Nonspecific ill-defined edema in the posterior paraspinal soft tissues, which could represent strain or cellulitis. MRI thoracic and lumbar spine: 1. Status post extension of lumbar fusion superiorly to T10. Nonspecific postoperative edema and fluid collection in the posterior paraspinal soft tissues centered at T12, which could represent a seroma but abscess is not excluded given reported purulent drainage. There also is nonspecific fluid surrounding the posterior elements at L2-L3. 2. Mild edema within the discs at T10-T11 and T12-L1, which could be degenerative or stress related but early discitis is difficult to exclude given the clinical history. No clearly infectious findings within the canal, although evaluation is limited by metallic artifact and motion. If there is clinical concern for infection, consider correlation with inflammatory  markers and close interval follow-up MRI with contrast as clinically indicated. 3. Improved canal stenosis and foraminal stenosis at L1-L2 status post decompression and fusion. 4. Mild to moderate canal stenosis at T9-T10 and T10-T11 appears similar versus slightly progressed. Similar mild canal stenosis at T7-T8. Electronically Signed   By: Margaretha Sheffield M.D.   On: 11/26/2020 16:39   US Abdomen Limited  Result Date: 11/27/2020 CLINICAL DATA:  Elevated LFTs and abdominal pain EXAM: ULTRASOUND ABDOMEN LIMITED RIGHT UPPER QUADRANT COMPARISON:  11/23/2020 CT FINDINGS: Gallbladder: No gallstones or wall thickening visualized. No sonographic Murphy sign noted by sonographer. Common bile duct: Diameter: 4.36 mm Liver: Hypodensity seen on recent CT examination is not well appreciated on today's exam. Portal vein is patent on color Doppler imaging with normal direction of blood flow towards the liver. Other: Minimal free fluid is noted in the right upper quadrant. Right-sided pleural effusion is noted. IMPRESSION: Minimal free fluid in the abdomen Small right-sided pleural effusion. No other focal abnormality is noted. Electronically Signed   By: Inez Catalina M.D.   On: 11/27/2020 22:11   DG Chest Port 1V same Day  Result Date: 11/27/2020 CLINICAL DATA:  Dyspnea. EXAM: PORTABLE CHEST 1 VIEW COMPARISON:  11/25/2020 FINDINGS: There is a nasogastric tube with tip below the GE junction. Stable cardiomediastinal contours. Low lung volumes with asymmetric elevation of the right hemidiaphragm. No airspace opacities. No pleural effusion or edema. IMPRESSION: Low lung volumes.  No acute findings. Electronically Signed   By: Kerby Moors M.D.   On: 11/27/2020 09:01   DG Abd Portable 1V  Result Date: 11/27/2020 CLINICAL DATA:  Follow-up ileus EXAM: PORTABLE ABDOMEN - 1 VIEW COMPARISON:  11/26/2020 FINDINGS: Similar ileus pattern with gas in small and large bowel. Nasogastric tube enters the stomach. Previously seen  rectal tube has been removed. No finding to suggest free air on the supine images. IMPRESSION: Persistent similar ileus pattern. Electronically Signed   By: Nelson Chimes M.D.   On: 11/27/2020 13:43    Anti-infectives: Anti-infectives (From admission, onward)    Start     Dose/Rate Route Frequency Ordered Stop   11/27/20 2145  ceFAZolin (ANCEF) IVPB 2g/100 mL premix        2 g 200 mL/hr over 30 Minutes Intravenous Every 8 hours 11/27/20 2052 11/28/20 0619   11/27/20 1200  nafcillin 12 g in sodium chloride 0.9 % 500 mL continuous infusion        12 g 20.8 mL/hr over 24 Hours Intravenous Every 24 hours 11/27/20 1007     11/25/20 1400  ceFAZolin (ANCEF) IVPB 2g/100 mL premix  Status:  Discontinued  2 g 200 mL/hr over 30 Minutes Intravenous Every 8 hours 11/25/20 0915 11/27/20 1007   11/24/20 1400  piperacillin-tazobactam (ZOSYN) IVPB 3.375 g  Status:  Discontinued       See Hyperspace for full Linked Orders Report.   3.375 g 12.5 mL/hr over 240 Minutes Intravenous Every 8 hours 11/24/20 0755 11/25/20 0915   11/24/20 0800  piperacillin-tazobactam (ZOSYN) IVPB 3.375 g       See Hyperspace for full Linked Orders Report.   3.375 g 100 mL/hr over 30 Minutes Intravenous  Once 11/24/20 0755 11/24/20 0940        Assessment/Plan Ileus with small bowel and colonic distension - xray yesterday similar with ileus pattern and gas in small and large intestine - Clinically he is not improving. Recommend repeat CT scan given tenderness and persistent symptoms but patient currently refusing.  - Continue NPO/NGT to LIWS - Add daily suppository - Monitor electrolytes and keep K>4 and Mag>2.  - Mobilize as able.  - Limit narcotics.  - GI following. No role for surgical intervention.    ID - zosyn 9/17>>9/18, ancef 9/18>>9/20, nafcillin 9/20>> VTE - sq heparin FEN - NPO/NGT to LIWS Foley - none   S/p back surgery 10/31/20 by Dr. Saintclair Halsted  S/p Debridement of postoperative lumbar wound infection  11/27/20 Dr. Ellene Route MSSA bacteremia A fib RVR GERD HTN Hypontonia congenita DM   LOS: 4 days    [name], [facility] 11/28/2020, 9:48 AM Please see Amion for pager number during day hours 7:00am-4:30pm

## 2020-11-28 NOTE — Evaluation (Signed)
Physical Therapy Evaluation Patient Details Name: Danny Hampton MRN: [id number] DOB: 1955/12/30 Today's Date: 11/28/2020  History of Present Illness  65 y/o male presented to [facility] on 9/16 with complaints of abdominal distention and constipation. CT showed mildly dilated small bowel and some colonic distension with air-fluid level concerning for ileus. Transferred to [facility] for further evaluation by neurosurgery due to his initial lumbar decompression on 10/31/20 due to purulent drainage from thoracic incision and blood cultures positive for MSSA. Developed new onset Afib RVR. Patient s/p debridement of postoperative lumbar wound infection on 9/20. PMH: GERD, HTN, myotonia congenita, DM, dysrhythmia, HLD, acid reflux, anxiety.  Clinical Impression  PTA, patient lives with girlfriend and reports independence with occasional use of RW. Patient's girlfriend unable to assist at home due to balance deficits. Patient presents with weakness, impaired balance, decreased activity tolerance, and impaired functional mobility. Patient requires maxA+2 for bed mobility this date due to increased pain. Patient reports dizziness and was diaphoretic with movement, VSS. Patient will benefit from skilled PT services during acute stay to address listed deficits. Recommend CIR following discharge to maximize functional independence and safety.        Recommendations for follow up therapy are one component of a multi-disciplinary discharge planning process, led by the attending physician.  Recommendations may be updated based on patient status, additional functional criteria and insurance authorization.  Follow Up Recommendations CIR    Equipment Recommendations  Other (comment) (TBD)    Recommendations for Other Services       Precautions / Restrictions Precautions Precautions: Fall Precaution Booklet Issued: No Precaution Comments: wound vac, no brace needed per order Restrictions Weight Bearing  Restrictions: No      Mobility  Bed Mobility Overal bed mobility: Needs Assistance Bed Mobility: Rolling;Sidelying to Sit;Sit to Sidelying Rolling: Max assist;+2 for physical assistance;+2 for safety/equipment Sidelying to sit: Max assist;+2 for physical assistance;+2 for safety/equipment     Sit to sidelying: Max assist;+2 for physical assistance;+2 for safety/equipment General bed mobility comments: maxA+2 to roll towards L side with cues for reaching across with R hand towards bed rail. Assist for bringing LEs off bed and trunk elevation. Patient with increased pain upon sitting and reports dizziness and was diaphoretic. VSS. Returned to supine with maxA+2    Transfers                 General transfer comment: deferred  Ambulation/Gait                Stairs            Wheelchair Mobility    Modified Rankin (Stroke Patients Only)       Balance Overall balance assessment: Needs assistance Sitting-balance support: Bilateral upper extremity supported;Feet unsupported Sitting balance-Leahy Scale: Poor                                       Pertinent Vitals/Pain Pain Assessment: Faces Faces Pain Scale: Hurts whole lot Pain Location: back Pain Descriptors / Indicators: Grimacing;Guarding;Sore Pain Intervention(s): Monitored during session;Repositioned;Limited activity within patient's tolerance    Home Living Family/patient expects to be discharged to:: Private residence Living Arrangements: Spouse/significant other Available Help at Discharge: Family;Available 24 hours/day Type of Home: House Home Access: Stairs to enter Entrance Stairs-Rails: None Entrance Stairs-Number of Steps: 2 Home Layout: One level Home Equipment: Darroll Bredeson - 2 wheels;Cane - single point;Grab bars - toilet;Grab bars -  tub/shower;Shower seat - built in      Prior Function Level of Independence: Independent with assistive device(s)         Comments:  household and short distanced community ambulator, occasionally drives. Uses RW occasionally     Hand Dominance        Extremity/Trunk Assessment   Upper Extremity Assessment Upper Extremity Assessment: Defer to OT evaluation    Lower Extremity Assessment Lower Extremity Assessment: Generalized weakness (Difficult to fully assess due to increased pain with minimal movement)    Cervical / Trunk Assessment Cervical / Trunk Assessment: Other exceptions Cervical / Trunk Exceptions: s/p surgery  Communication   Communication: No difficulties  Cognition Arousal/Alertness: Awake/alert Behavior During Therapy: WFL for tasks assessed/performed Overall Cognitive Status: No family/caregiver present to determine baseline cognitive functioning                                 General Comments: WFL with simple tasks. Guarded towards therapists initially. Likes to be told step by step prior to moving      General Comments      Exercises     Assessment/Plan    PT Assessment Patient needs continued PT services  PT Problem List Decreased strength;Decreased activity tolerance;Decreased mobility;Decreased balance;Decreased safety awareness;Decreased knowledge of precautions;Cardiopulmonary status limiting activity       PT Treatment Interventions DME instruction;Gait training;Stair training;Functional mobility training;Therapeutic activities;Therapeutic exercise;Balance training;Patient/family education    PT Goals (Current goals can be found in the Care Plan section)  Acute Rehab PT Goals Patient Stated Goal: to reduce pain PT Goal Formulation: With patient Time For Goal Achievement: 12/12/20 Potential to Achieve Goals: Good    Frequency Min 4X/week   Barriers to discharge        Co-evaluation PT/OT/SLP Co-Evaluation/Treatment: Yes Reason for Co-Treatment: For patient/therapist safety;To address functional/ADL transfers PT goals addressed during session:  Mobility/safety with mobility         AM-PAC PT "6 Clicks" Mobility  Outcome Measure Help needed turning from your back to your side while in a flat bed without using bedrails?: Total Help needed moving from lying on your back to sitting on the side of a flat bed without using bedrails?: Total Help needed moving to and from a bed to a chair (including a wheelchair)?: Total Help needed standing up from a chair using your arms (e.g., wheelchair or bedside chair)?: Total Help needed to walk in hospital room?: Total Help needed climbing 3-5 steps with a railing? : Total 6 Click Score: 6    End of Session   Activity Tolerance: Patient limited by pain Patient left: in bed;with call bell/phone within reach Nurse Communication: Mobility status PT Visit Diagnosis: Unsteadiness on feet (R26.81);Other abnormalities of gait and mobility (R26.89);Muscle weakness (generalized) (M62.81)    Time: 7824-2353 PT Time Calculation (min) (ACUTE ONLY): 27 min   Charges:   PT Evaluation $PT Eval Moderate Complexity: 1 Mod PT Treatments $Therapeutic Activity: 8-22 mins        Shaine Mount A. Gilford Rile PT, DPT Acute Rehabilitation Services Pager [phone] Office [phone]   Linna Hoff 11/28/2020, 1:29 PM

## 2020-11-28 NOTE — Progress Notes (Signed)
Inpatient Rehab Admissions Coordinator Note:   Per therapy recommendations, patient was screened for CIR candidacy by Michel Santee, PT. At this time, pt appears to be a potential candidate for CIR. I will request an order for rehab consult for full assessment, per our protocol.  Please contact me any with questions.Shann Medal, PT, DPT [phone] 11/28/20 1:50 PM

## 2020-11-28 NOTE — Progress Notes (Addendum)
OT Cancellation Note  Patient Details Name: Danny Hampton MRN: [id number] DOB: 03-21-1955   Cancelled Treatment:    Reason Eval/Treat Not Completed: Patient not medically ready Patient went into A. fib with RVR last 9/20 and was started on Cardizem infusion   Billey Chang, OTR/L  Acute Rehabilitation Services Pager: [phone] Office: [phone] .  11/28/2020, 8:08 AM

## 2020-11-28 NOTE — Progress Notes (Signed)
   Patient Name: Danny Hampton Date of Encounter: 11/28/2020, 12:39 PM    Subjective  Ileus - abd pain and distention - says somewhat better (a little) re: pain after lumbar debridement   Objective  BP 118/87   Pulse (!) 117   Temp 97.7 F (36.5 C) (Oral)   Resp (!) 26   Ht 5\' 4"  (1.626 m)   Wt 94.2 kg   SpO2 (!) 89%   BMI 35.65 kg/m  NAD NG tube in Abd very distended but a little soft, quiet and not sif tender to my exam  Recent Labs  Lab 11/24/20 0546 11/25/20 0306 11/26/20 0454 11/27/20 0348 11/27/20 0851 11/28/20 0418  NA 130* 124* 124* 130*  --  135  K 3.7 3.2* 4.5 4.4  --  3.7  CL 89* 90* 91* 95*  --  97*  CO2 32 25 22 21*  --  22  GLUCOSE 175* 154* 138* 128*  --  181*  BUN 16 14 25* 23  --  16  CREATININE 0.78 0.79 1.36* 1.05  --  1.08  CALCIUM 8.6* 7.4* 7.6* 8.6*  --  8.4*  MG 1.5* 1.9  --   --  1.9 1.9  PHOS  --   --   --   --  2.4* 2.5   CBC Latest Ref Rng & Units 11/28/2020 11/27/2020 11/26/2020  WBC 4.0 - 10.5 K/uL 5.8 5.8 6.4  Hemoglobin 13.0 - 17.0 g/dL 9.6(L) 10.0(L) 9.8(L)  Hematocrit 39.0 - 52.0 % 28.4(L) 28.7(L) 29.5(L)  Platelets 150 - 400 K/uL 97(L) 77(L) 83(L)    Mg 1.9   Assessment and Plan   Ileus - small and large bowel  Note he has hx of this w/ last spine surgery 2 yrs ago  Will recheck KUB  Consider neostigmine  Support as is being done Gatha Mayer, MD, [facility] 11/28/2020 12:39 PM

## 2020-11-28 NOTE — Progress Notes (Signed)
   11/28/20 0800  Vitals  BP 132/87  MAP (mmHg) 100  Pulse Rate (!) 115  ECG Heart Rate (!) 116  Resp 17  Oxygen Therapy  SpO2 100 %  Art Line  Arterial Line BP 169/83  Arterial Line MAP (mmHg) 104 mmHg  MEWS Score  MEWS Temp 0  MEWS Systolic 0  MEWS Pulse 2  MEWS RR 0  MEWS LOC 0  MEWS Score 2  MEWS Score Color Yellow  Provider Notification  Provider Name/Title Dr  Latina Craver  Date Provider Notified 11/28/20  Time Provider Notified 0800  Notification Type  (e chat)  Notification Reason Change in status (bp & O2 issues)  Provider response Evaluate remotely;Other (Comment) (CCM Dr Tacy Learn will take over care)  Date of Provider Response 11/28/20  Time of Provider Response [phone]

## 2020-11-28 NOTE — Progress Notes (Signed)
Patient ID: Danny Hampton, adult   DOB: 04/18/1955, 65 y.o.   MRN: [id number] Drain with modest output.  Patient remains tachycardic.  Level of consciousness appears good.  continue to follow.

## 2020-11-28 NOTE — Evaluation (Signed)
Occupational Therapy Evaluation Patient Details Name: Danny Hampton MRN: [id number] DOB: 1955/11/11 Today's Date: 11/28/2020   History of Present Illness 65 y/o male presented to [facility] on 9/16 with complaints of abdominal distention and constipation. CT showed mildly dilated small bowel and some colonic distension with air-fluid level concerning for ileus. Transferred to [facility] for further evaluation by neurosurgery due to his initial lumbar decompression on 10/31/20 due to purulent drainage from thoracic incision and blood cultures positive for MSSA. Developed new onset Afib RVR. Patient s/p debridement of postoperative lumbar wound infection on 9/20. PMH: GERD, HTN, myotonia congenita, DM, dysrhythmia, HLD, acid reflux, anxiety.   Clinical Impression   PT admitted with abdominal distention and MSSA culture at thoracic incision now with Afib RVR. Pt currently with functional limitiations due to the deficits listed below (see OT problem list). Pt currently reports severe pain and limited movement due to belly. Pt sitting eob and then noted to have some increased drainage into the NG tube.  Pt will benefit from skilled OT to increase their independence and safety with adls and balance to allow discharge CIR.       Recommendations for follow up therapy are one component of a multi-disciplinary discharge planning process, led by the attending physician.  Recommendations may be updated based on patient status, additional functional criteria and insurance authorization.   Follow Up Recommendations  CIR    Equipment Recommendations  3 in 1 bedside commode    Recommendations for Other Services Rehab consult     Precautions / Restrictions Precautions Precautions: Fall Precaution Booklet Issued: No Precaution Comments: wound vac, no brace needed per order NG tube, ALine, condom foley, multiple IV lines Restrictions Weight Bearing Restrictions: No      Mobility Bed Mobility Overal bed  mobility: Needs Assistance Bed Mobility: Rolling;Sidelying to Sit;Sit to Sidelying Rolling: Max assist;+2 for physical assistance;+2 for safety/equipment Sidelying to sit: Max assist;+2 for physical assistance;+2 for safety/equipment     Sit to sidelying: Max assist;+2 for physical assistance;+2 for safety/equipment General bed mobility comments: maxA+2 to roll towards L side with cues for reaching across with R hand towards bed rail. Assist for bringing LEs off bed and trunk elevation. Patient with increased pain upon sitting and reports dizziness and was diaphoretic. VSS. Returned to supine with maxA+2    Transfers                 General transfer comment: declined due to pain at EOB    Balance Overall balance assessment: Needs assistance Sitting-balance support: Bilateral upper extremity supported;Feet unsupported Sitting balance-Leahy Scale: Poor                                     ADL either performed or assessed with clinical judgement   ADL Overall ADL's : Needs assistance/impaired Eating/Feeding: Set up;Bed level   Grooming: Set up;Sitting   Upper Body Bathing: Maximal assistance   Lower Body Bathing: Total assistance   Upper Body Dressing : Maximal assistance   Lower Body Dressing: Total assistance                 General ADL Comments: EOB only     Vision Baseline Vision/History: 0 No visual deficits       Perception     Praxis      Pertinent Vitals/Pain Pain Assessment: Faces Faces Pain Scale: Hurts whole lot Pain Location: back Pain Descriptors /  Indicators: Grimacing;Guarding;Sore Pain Intervention(s): Limited activity within patient's tolerance     Hand Dominance Right   Extremity/Trunk Assessment Upper Extremity Assessment Upper Extremity Assessment: Overall WFL for tasks assessed   Lower Extremity Assessment Lower Extremity Assessment: Defer to PT evaluation   Cervical / Trunk Assessment Cervical / Trunk  Assessment: Other exceptions Cervical / Trunk Exceptions: s/p surgery   Communication Communication Communication: No difficulties   Cognition Arousal/Alertness: Awake/alert Behavior During Therapy: WFL for tasks assessed/performed Overall Cognitive Status: No family/caregiver present to determine baseline cognitive functioning                                 General Comments: WFL with simple tasks. Guarded towards therapists initially. Likes to be told step by step prior to moving Pt being given step by step instructions and pt still states "just tell me what you are going to do" pt with delayed response to commands   General Comments  VSS - see RN vital input. pt describes dizziness and nausea at [facility]    Exercises     Shoulder Instructions      Home Living Family/patient expects to be discharged to:: Private residence Living Arrangements: Spouse/significant other Available Help at Discharge: Family;Available 24 hours/day Type of Home: House Home Access: Stairs to enter [employer] of Steps: 2 Entrance Stairs-Rails: None Home Layout: One level     Bathroom Shower/Tub: Occupational psychologist: Handicapped height Bathroom Accessibility: Yes   Home Equipment: Environmental consultant - 2 wheels;Cane - single point;Grab bars - toilet;Grab bars - tub/shower;Shower seat - built in          Prior Functioning/Environment Level of Independence: Independent with assistive device(s)        Comments: household and short distanced community ambulator, occasionally drives. Uses RW occasionally        OT Problem List: Impaired balance (sitting and/or standing);Pain;Decreased strength;Decreased activity tolerance;Decreased cognition;Decreased safety awareness;Decreased knowledge of use of DME or AE      OT Treatment/Interventions: Self-care/ADL training;Therapeutic activities;DME and/or AE instruction;Balance training;Therapeutic exercise;Energy  conservation;Patient/family education;Cognitive remediation/compensation;Manual therapy    OT Goals(Current goals can be found in the care plan section) Acute Rehab OT Goals Patient Stated Goal: to get better and my belly not be so big OT Goal Formulation: With patient Time For Goal Achievement: 12/12/20 Potential to Achieve Goals: Good  OT Frequency: Min 2X/week   Barriers to D/C:            Co-evaluation PT/OT/SLP Co-Evaluation/Treatment: Yes Reason for Co-Treatment: Complexity of the patient's impairments (multi-system involvement);Necessary to address cognition/behavior during functional activity;For patient/therapist safety;To address functional/ADL transfers PT goals addressed during session: Mobility/safety with mobility OT goals addressed during session: ADL's and self-care;Proper use of Adaptive equipment and DME;Strengthening/ROM      AM-PAC OT "6 Clicks" Daily Activity     Outcome Measure Help from another person eating meals?: A Little Help from another person taking care of personal grooming?: A Little Help from another person toileting, which includes using toliet, bedpan, or urinal?: A Lot Help from another person bathing (including washing, rinsing, drying)?: A Lot Help from another person to put on and taking off regular upper body clothing?: A Little Help from another person to put on and taking off regular lower body clothing?: A Lot 6 Click Score: 15   End of Session Nurse Communication: Mobility status;Precautions  Activity Tolerance: Patient limited by pain Patient left: in bed;with call  bell/phone within reach  OT Visit Diagnosis: Pain                Time: 2479-9800 OT Time Calculation (min): 28 min Charges:  OT General Charges $OT Visit: 1 Visit OT Evaluation $OT Eval Moderate Complexity: 1 Mod   Brynn, OTR/L  Acute Rehabilitation Services Pager: [phone] Office: [phone] .   Jeri Modena 11/28/2020, 3:18 PM

## 2020-11-29 ENCOUNTER — Inpatient Hospital Stay: Payer: Self-pay

## 2020-11-29 ENCOUNTER — Inpatient Hospital Stay (HOSPITAL_COMMUNITY): Payer: No Typology Code available for payment source

## 2020-11-29 DIAGNOSIS — R109 Unspecified abdominal pain: Secondary | ICD-10-CM | POA: Diagnosis not present

## 2020-11-29 DIAGNOSIS — K567 Ileus, unspecified: Secondary | ICD-10-CM | POA: Diagnosis not present

## 2020-11-29 DIAGNOSIS — J9601 Acute respiratory failure with hypoxia: Secondary | ICD-10-CM | POA: Diagnosis not present

## 2020-11-29 DIAGNOSIS — R7989 Other specified abnormal findings of blood chemistry: Secondary | ICD-10-CM | POA: Diagnosis not present

## 2020-11-29 DIAGNOSIS — R7881 Bacteremia: Secondary | ICD-10-CM | POA: Diagnosis not present

## 2020-11-29 DIAGNOSIS — K9189 Other postprocedural complications and disorders of digestive system: Secondary | ICD-10-CM

## 2020-11-29 DIAGNOSIS — I483 Typical atrial flutter: Secondary | ICD-10-CM | POA: Diagnosis not present

## 2020-11-29 DIAGNOSIS — M545 Low back pain, unspecified: Secondary | ICD-10-CM | POA: Diagnosis not present

## 2020-11-29 LAB — COMPREHENSIVE METABOLIC PANEL
ALT: 13 U/L (ref 0–44)
AST: 145 U/L — ABNORMAL HIGH (ref 15–41)
Albumin: 2.1 g/dL — ABNORMAL LOW (ref 3.5–5.0)
Alkaline Phosphatase: 50 U/L (ref 38–126)
Anion gap: 11 (ref 5–15)
BUN: 20 mg/dL (ref 8–23)
CO2: 24 mmol/L (ref 22–32)
Calcium: 8.3 mg/dL — ABNORMAL LOW (ref 8.9–10.3)
Chloride: 96 mmol/L — ABNORMAL LOW (ref 98–111)
Creatinine, Ser: 0.92 mg/dL (ref 0.61–1.24)
GFR, Estimated: >60 mL/min (ref >60)
Glucose, Bld: 133 mg/dL — ABNORMAL HIGH (ref 70–99)
Potassium: 3.9 mmol/L (ref 3.5–5.1)
Sodium: 131 mmol/L — ABNORMAL LOW (ref 135–145)
Total Bilirubin: 1.9 mg/dL — ABNORMAL HIGH (ref 0.3–1.2)
Total Protein: 5.3 g/dL — ABNORMAL LOW (ref 6.5–8.1)

## 2020-11-29 LAB — GLUCOSE, CAPILLARY
Glucose-Capillary: 116 mg/dL — ABNORMAL HIGH (ref 70–99)
Glucose-Capillary: 144 mg/dL — ABNORMAL HIGH (ref 70–99)
Glucose-Capillary: 149 mg/dL — ABNORMAL HIGH (ref 70–99)
Glucose-Capillary: 153 mg/dL — ABNORMAL HIGH (ref 70–99)
Glucose-Capillary: 128 mg/dL — ABNORMAL HIGH (ref 70–99)
Glucose-Capillary: 117 mg/dL — ABNORMAL HIGH (ref 70–99)
Glucose-Capillary: 120 mg/dL — ABNORMAL HIGH (ref 70–99)

## 2020-11-29 LAB — CBC WITH DIFFERENTIAL/PLATELET
Abs Immature Granulocytes: 0.13 10*3/uL — ABNORMAL HIGH (ref 0.00–0.07)
Basophils Absolute: 0 10*3/uL (ref 0.0–0.1)
Basophils Relative: 0 %
Eosinophils Absolute: 0.2 10*3/uL (ref 0.0–0.5)
Eosinophils Relative: 2 %
HCT: 27.8 % — ABNORMAL LOW (ref 39.0–52.0)
Hemoglobin: 9.5 g/dL — ABNORMAL LOW (ref 13.0–17.0)
Immature Granulocytes: 2 %
Lymphocytes Relative: 11 %
Lymphs Abs: 0.8 10*3/uL (ref 0.7–4.0)
MCH: 31.1 pg (ref 26.0–34.0)
MCHC: 34.2 g/dL (ref 30.0–36.0)
MCV: 91.1 fL (ref 80.0–100.0)
Monocytes Absolute: 1.2 10*3/uL — ABNORMAL HIGH (ref 0.1–1.0)
Monocytes Relative: 16 %
Neutro Abs: 5.2 10*3/uL (ref 1.7–7.7)
Neutrophils Relative %: 69 %
Platelets: 133 10*3/uL — ABNORMAL LOW (ref 150–400)
RBC: 3.05 MIL/uL — ABNORMAL LOW (ref 4.22–5.81)
RDW: 13.1 % (ref 11.5–15.5)
WBC: 7.5 10*3/uL (ref 4.0–10.5)
nRBC: 0 % (ref 0.0–0.2)

## 2020-11-29 LAB — AEROBIC CULTURE W GRAM STAIN (SUPERFICIAL SPECIMEN)
Gram Stain: NONE SEEN
Special Requests: NORMAL

## 2020-11-29 IMAGING — DX DG CHEST 1V PORT
1 series · 1 of 1 positions shown · non-contrast
Comparison: Chest radiograph [DATE], CT [DATE]

CLINICAL DATA: Encounter for removal of tunneled central venous
catheter.

EXAM:
PORTABLE CHEST 1 VIEW

[chest ap]
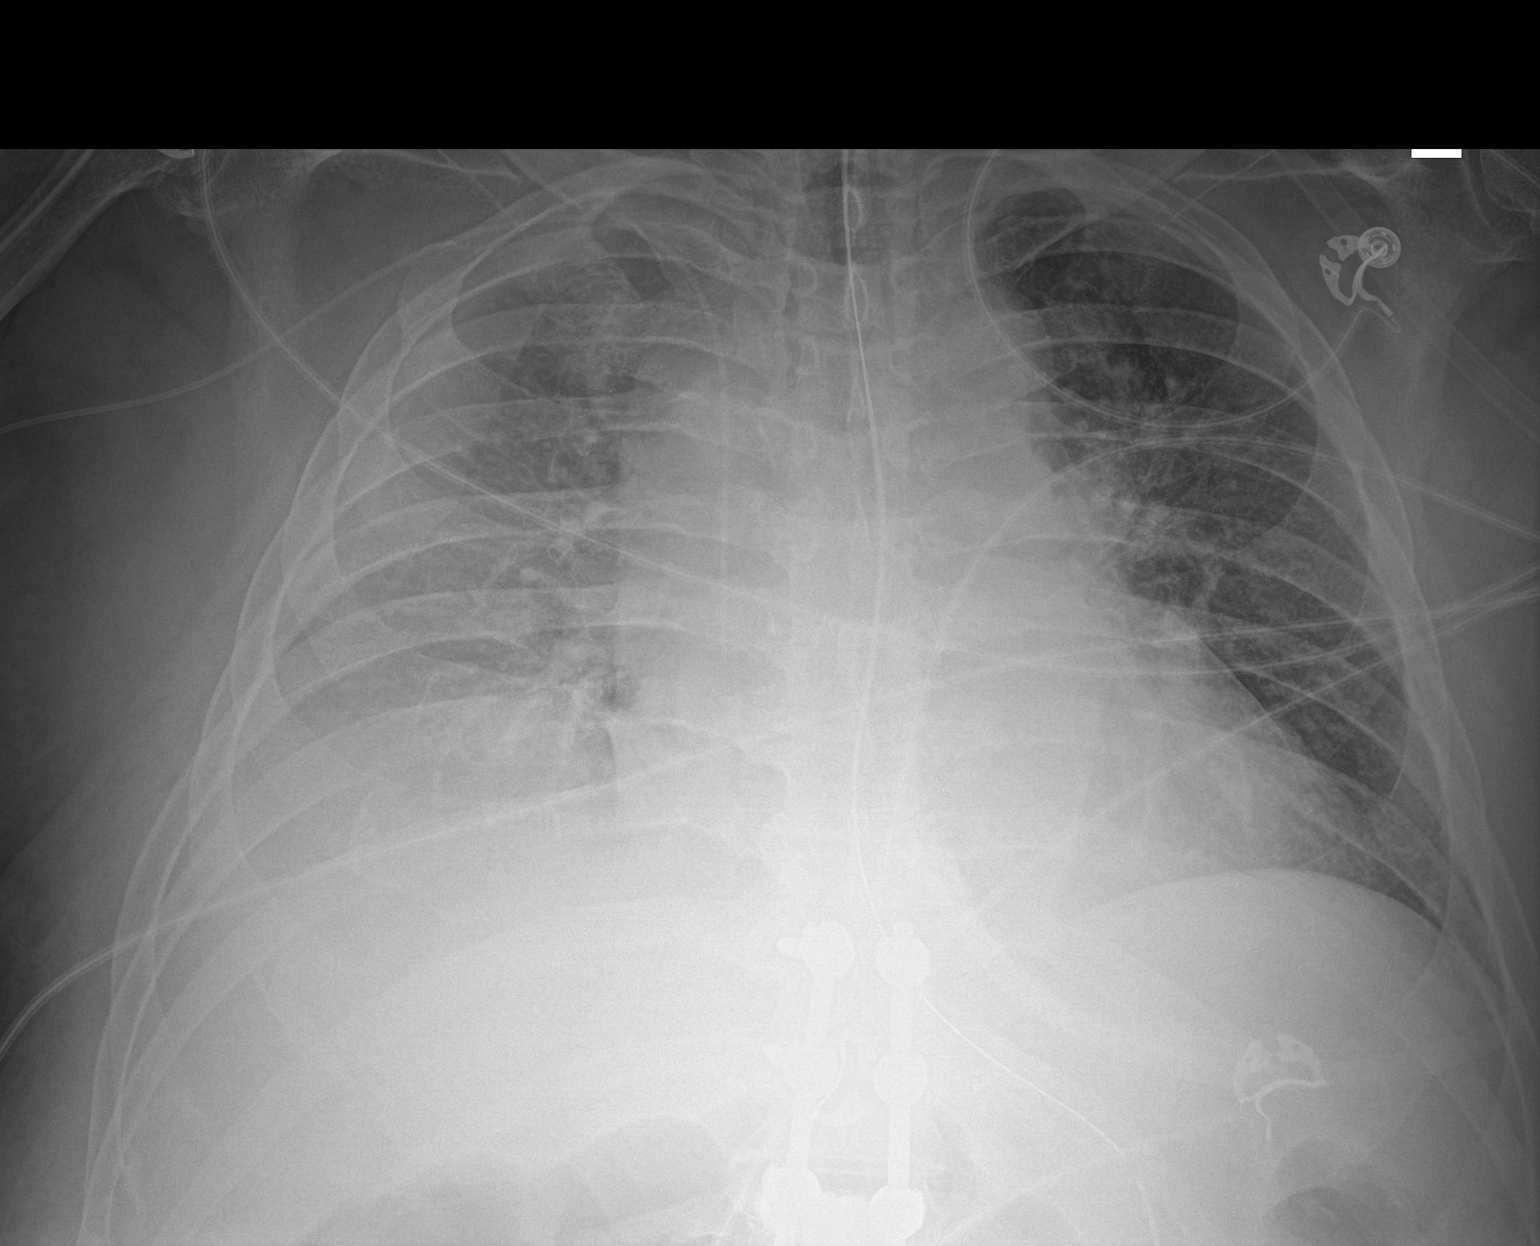

[1 of 1 positions shown; findings below may reference images not displayed]

FINDINGS: New right upper extremity PICC tip in the lower SVC. Enteric tube in
place with tip and side-port below the diaphragm. Hazy opacity
throughout the right hemithorax may represent layering pleural
effusion, atelectasis, or combination there of. Low lung volumes
persist. Stable heart size and mediastinal contours, heart size
accentuated by low volume technique. No pneumothorax.
Bronchovascular crowding versus vascular congestion.
IMPRESSION: 1. Right upper extremity PICC tip in the lower SVC, new from prior
exam.
2. Enteric tube in place.
3. Hazy opacity throughout the right hemithorax may represent
layering pleural effusion, atelectasis, or combination of.

## 2020-11-29 MED ORDER — SODIUM CHLORIDE 0.9% FLUSH: 10.0000 mL | INTRAVENOUS | Status: DC | PRN

## 2020-11-29 MED ORDER — LACTATED RINGERS IV BOLUS
1000.0000 mL | Freq: Once | INTRAVENOUS | Status: AC
  Administered 2020-11-29: 1000 mL via INTRAVENOUS

## 2020-11-29 MED ORDER — LISINOPRIL 20 MG PO TABS
40.0000 mg | ORAL_TABLET | Freq: Every day | ORAL | Status: DC
  Administered 2020-11-29 – 2020-12-01 (×3): 40 mg
  Filled 2020-11-29 (×3): qty 2

## 2020-11-29 MED ORDER — SODIUM CHLORIDE 0.9% FLUSH
10.0000 mL | Freq: Two times a day (BID) | INTRAVENOUS | Status: DC
Start: 2020-11-29 — End: 2020-12-07
  Administered 2020-11-29 – 2020-11-30 (×3): 10 mL
  Administered 2020-12-01: 20 mL
  Administered 2020-12-01 – 2020-12-02 (×2): 10 mL
  Administered 2020-12-02: 20 mL
  Administered 2020-12-03 – 2020-12-06 (×4): 10 mL

## 2020-11-29 MED ORDER — POLYETHYLENE GLYCOL 3350 17 G PO PACK
17.0000 g | PACK | Freq: Every day | ORAL | Status: DC
  Administered 2020-11-30 – 2020-12-03 (×4): 17 g
  Filled 2020-11-29 (×4): qty 1

## 2020-11-29 NOTE — Progress Notes (Signed)
Progress Note  Patient Name: Danny Hampton Date of Encounter: 11/29/2020  [facility] Cardiologist: Rozann Lesches, MD   Subjective   Resting comfortably  Inpatient Medications    Scheduled Meds:  aspirin  81 mg Oral Daily   bisacodyl  10 mg Rectal Daily   budesonide (PULMICORT) nebulizer solution  0.25 mg Nebulization BID   Carbidopa-Levodopa ER  1 tablet Oral QID   carvedilol  25 mg Oral BID WC   Chlorhexidine Gluconate Cloth  6 each Topical Daily   diltiazem  90 mg Oral Q6H   docusate sodium  100 mg Oral BID   heparin  5,000 Units Subcutaneous Q8H   insulin aspart  0-9 Units Subcutaneous Q4H   lisinopril  40 mg Per Tube Daily   loratadine  10 mg Oral Daily   metoCLOPramide (REGLAN) injection  10 mg Intravenous Q12H   mupirocin ointment  1 application Nasal BID   pantoprazole (PROTONIX) IV  40 mg Intravenous Q24H   PARoxetine  40 mg Oral Daily   And   PARoxetine  20 mg Oral QHS   phenytoin (DILANTIN) IV  100 mg Intravenous Q8H   senna  1 tablet Per Tube BID   sodium chloride flush  3 mL Intravenous Q12H   Continuous Infusions:  sodium chloride Stopped (11/28/20 0549)   diltiazem (CARDIZEM) infusion 20 mg/hr (11/29/20 1100)   nafcillin (NAFCIL) continuous infusion 20.8 mL/hr at 11/29/20 1100   PRN Meds: acetaminophen **OR** acetaminophen, albuterol, HYDROmorphone (DILAUDID) injection, hydrOXYzine, LORazepam, menthol-cetylpyridinium **OR** phenol, ondansetron **OR** ondansetron (ZOFRAN) IV, oxyCODONE, polyethylene glycol, sodium chloride flush, sodium phosphate   Vital Signs    Vitals:   11/29/20 0900 11/29/20 1000 11/29/20 1100 11/29/20 1111  BP:      Pulse: 88 79 85   Resp: 19 (!) 9 16   Temp:    97.6 F (36.4 C)  TempSrc:    Oral  SpO2: 91% 95% 96%   Weight:      Height:        Intake/Output Summary (Last 24 hours) at 11/29/2020 1142 Last data filed at 11/29/2020 1100 Gross per 24 hour  Intake 1258.26 ml  Output 650 ml  Net 608.26 ml    Last 3 Weights 11/29/2020 11/28/2020 11/27/2020  Weight (lbs) 207 lb 0.2 oz 207 lb 10.8 oz 208 lb 8.9 oz  Weight (kg) 93.9 kg 94.2 kg 94.6 kg      Telemetry    NSR - Personally Reviewed  ECG      Physical Exam   GEN: No acute distress.   Neck: No JVD Cardiac: RRR, no murmurs, rubs, or gallops.  Respiratory: mild wheezing bilaterally.  Labs    High Sensitivity Troponin:  No results for input(s): TROPONINIHS in the last 720 hours.   Chemistry Recent Labs  Lab 11/25/20 0306 11/26/20 0454 11/27/20 0348 11/27/20 0851 11/28/20 0418 11/29/20 0436  NA 124*   < > 130*  --  135 131*  K 3.2*   < > 4.4  --  3.7 3.9  CL 90*   < > 95*  --  97* 96*  CO2 25   < > 21*  --  22 24  GLUCOSE 154*   < > 128*  --  181* 133*  BUN 14   < > 23  --  16 20  CREATININE 0.79   < > 1.05  --  1.08 0.92  CALCIUM 7.4*   < > 8.6*  --  8.4* 8.3*  MG 1.9  --   --  1.9 1.9  --   PROT 5.6*   < > 5.9*  --  5.8* 5.3*  ALBUMIN 2.7*   < > 2.5*  --  2.3* 2.1*  AST 41   < > 393*  --  296* 145*  ALT 9   < > 77*  --  103* 13  ALKPHOS 60   < > 50  --  53 50  BILITOT 0.6   < > 1.0  --  1.7* 1.9*  GFRNONAA >60   < > >60  --  >60 >60  ANIONGAP 9   < > 14  --  16* 11   < > = values in this interval not displayed.    Lipids No results for input(s): CHOL, TRIG, HDL, LABVLDL, LDLCALC, CHOLHDL in the last 168 hours.  Hematology Recent Labs  Lab 11/27/20 0348 11/28/20 0418 11/29/20 0436  WBC 5.8 5.8 7.5  RBC 3.12* 3.08* 3.05*  HGB 10.0* 9.6* 9.5*  HCT 28.7* 28.4* 27.8*  MCV 92.0 92.2 91.1  MCH 32.1 31.2 31.1  MCHC 34.8 33.8 34.2  RDW 13.0 12.9 13.1  PLT 77* 97* 133*   Thyroid  Recent Labs  Lab 11/27/20 0851  TSH 3.489    BNP Recent Labs  Lab 11/27/20 0851  BNP 249.2*    DDimer No results for input(s): DDIMER in the last 168 hours.   Radiology    US Abdomen Limited  Result Date: 11/27/2020 CLINICAL DATA:  Elevated LFTs and abdominal pain EXAM: ULTRASOUND ABDOMEN LIMITED RIGHT UPPER  QUADRANT COMPARISON:  11/23/2020 CT FINDINGS: Gallbladder: No gallstones or wall thickening visualized. No sonographic Murphy sign noted by sonographer. Common bile duct: Diameter: 4.36 mm Liver: Hypodensity seen on recent CT examination is not well appreciated on today's exam. Portal vein is patent on color Doppler imaging with normal direction of blood flow towards the liver. Other: Minimal free fluid is noted in the right upper quadrant. Right-sided pleural effusion is noted. IMPRESSION: Minimal free fluid in the abdomen Small right-sided pleural effusion. No other focal abnormality is noted. Electronically Signed   By: Inez Catalina M.D.   On: 11/27/2020 22:11   DG Abd Portable 1V  Result Date: 11/28/2020 CLINICAL DATA:  Follow-up ileus EXAM: PORTABLE ABDOMEN - 1 VIEW COMPARISON:  11/27/2020 FINDINGS: Stable bowel gas pattern is noted throughout the large and small bowel consistent with the known history of ileus. No free air is seen. Postsurgical changes in the lumbar and thoracic spine are noted. No other focal abnormality is noted. IMPRESSION: Stable ileus pattern when compare with the prior day. Electronically Signed   By: Inez Catalina M.D.   On: 11/28/2020 15:31   [place] EKG SITE RITE  Result Date: 11/29/2020 [facility] image not attached, placement could not be confirmed due to current cardiac rhythm.   Cardiac Studies   Echo done  Patient Profile     65 y.o. adult with atrial flutter  Assessment & Plan    Back in NSR on IV Diltiazem.  COuld switch dilt to oral when he is taking PO more reliably.     For questions or updates, please contact [place] Please consult [email or web address] for contact info under        Signed, Larae Grooms, MD  11/29/2020, 11:42 AM

## 2020-11-29 NOTE — Progress Notes (Signed)
Physical Therapy Treatment Patient Details Name: Danny Hampton MRN: [id number] DOB: October 28, 1955 Today's Date: 11/29/2020   History of Present Illness 65 y/o male presented to [facility] on 9/16 with complaints of abdominal distention and constipation. CT showed mildly dilated small bowel and some colonic distension with air-fluid level concerning for ileus. Transferred to [facility] for further evaluation by neurosurgery due to his initial lumbar decompression on 10/31/20 due to purulent drainage from thoracic incision and blood cultures positive for MSSA. Developed new onset Afib RVR. Patient s/p debridement of postoperative lumbar wound infection on 9/20. PMH: GERD, HTN, myotonia congenita, DM, dysrhythmia, HLD, acid reflux, anxiety.    PT Comments    Patient progressing towards physical therapy goals. Patient with better pain control compared to previous session but continues to be limited by pain. Patient able to stand from elevated EOB with modA+2 and take sidesteps towards [facility] with minA+2. Reviewed back precautions with patient. Updated recommendation to SNF following discharge to maximize functional mobility and safety as well as decrease caregiver burden.     Recommendations for follow up therapy are one component of a multi-disciplinary discharge planning process, led by the attending physician.  Recommendations may be updated based on patient status, additional functional criteria and insurance authorization.  Follow Up Recommendations  SNF     Equipment Recommendations  Other (comment) (TBD)    Recommendations for Other Services       Precautions / Restrictions Precautions Precautions: Fall Precaution Booklet Issued: No Precaution Comments: wound vac, no brace needed per order, NG tube, A-line, multiple IV lines Restrictions Weight Bearing Restrictions: No     Mobility  Bed Mobility Overal bed mobility: Needs Assistance Bed Mobility: Rolling;Sidelying to Sit;Sit to  Sidelying Rolling: Min assist;+2 for physical assistance;+2 for safety/equipment Sidelying to sit: Mod assist;+2 for physical assistance;+2 for safety/equipment     Sit to sidelying: Max assist;+2 for physical assistance;+2 for safety/equipment General bed mobility comments: minA+2 for rolling towards R side and modA+2 for trunk elevation and bringing LEs off bed    Transfers Overall transfer level: Needs assistance Equipment used: 2 person hand held assist Transfers: Sit to/from Stand Sit to Stand: Mod assist;+2 physical assistance;+2 safety/equipment         General transfer comment: modA+2 to rise and steady from elevated bed surface. Cues for patient to scoot buttocks forward to put feet on ground prior to standing  Ambulation/Gait Ambulation/Gait assistance: Min assist;+2 physical assistance;+2 safety/equipment Gait Distance (Feet): 2 Feet Assistive device: 2 person hand held assist Gait Pattern/deviations: Step-to pattern Gait velocity: Decreased   General Gait Details: side steps towards [facility] with minA+2 for balance and safety.   Stairs             Wheelchair Mobility    Modified Rankin (Stroke Patients Only)       Balance Overall balance assessment: Needs assistance Sitting-balance support: Bilateral upper extremity supported;Feet unsupported Sitting balance-Leahy Scale: Fair     Standing balance support: During functional activity;Bilateral upper extremity supported Standing balance-Leahy Scale: Poor Standing balance comment: reliant on UE support and external assist                            Cognition Arousal/Alertness: Awake/alert Behavior During Therapy: WFL for tasks assessed/performed Overall Cognitive Status: No family/caregiver present to determine baseline cognitive functioning  General Comments: WFL with simple tasks. Delayed response to commands      Exercises      General  Comments General comments (skin integrity, edema, etc.): VSS on 5L O2 [place]      Pertinent Vitals/Pain Pain Assessment: Faces Faces Pain Scale: Hurts even more Pain Location: back Pain Descriptors / Indicators: Grimacing;Guarding;Sore Pain Intervention(s): Monitored during session;Repositioned    Home Living                      Prior Function            PT Goals (current goals can now be found in the care plan section) Acute Rehab PT Goals Patient Stated Goal: to get better and my belly not be so big PT Goal Formulation: With patient Time For Goal Achievement: 12/12/20 Potential to Achieve Goals: Good Progress towards PT goals: Progressing toward goals    Frequency    Min 3X/week      PT Plan Current plan remains appropriate    Co-evaluation              AM-PAC PT "6 Clicks" Mobility   Outcome Measure  Help needed turning from your back to your side while in a flat bed without using bedrails?: Total Help needed moving from lying on your back to sitting on the side of a flat bed without using bedrails?: Total Help needed moving to and from a bed to a chair (including a wheelchair)?: Total Help needed standing up from a chair using your arms (e.g., wheelchair or bedside chair)?: Total Help needed to walk in hospital room?: Total Help needed climbing 3-5 steps with a railing? : Total 6 Click Score: 6    End of Session Equipment Utilized During Treatment: Gait belt Activity Tolerance: Patient tolerated treatment well Patient left: in bed;with call bell/phone within reach Nurse Communication: Mobility status PT Visit Diagnosis: Unsteadiness on feet (R26.81);Other abnormalities of gait and mobility (R26.89);Muscle weakness (generalized) (M62.81)     Time: 9179-1505 PT Time Calculation (min) (ACUTE ONLY): 21 min  Charges:  $Therapeutic Activity: 8-22 mins                     Neshawn Aird A. Gilford Rile PT, DPT Acute Rehabilitation Services Pager  [phone] Office [phone]    Linna Hoff 11/29/2020, 4:48 PM

## 2020-11-29 NOTE — Progress Notes (Signed)
[place] for Infectious Disease  Date of Admission:  11/23/2020           Reason for visit: Follow up on MSSA bacteremia  Current antibiotics: Nafcillin 9/20-present   Previous antibiotics: Cefazolin 9/18-9/20 Zosyn 9/17  ASSESSMENT:    65 y.o. adult admitted with:  MSSA bacteremia secondary to back infection from recent spinal surgery with hardware placement.  Taken back to the OR 11/27/2020 for I&D.  Cultures with staph aureus as well.  Repeat blood cultures from 11/25/2020 no growth to date. Ileus: General surgery and gastroenterology following. Severe sepsis secondary to #1. Atrial fibrillation with RVR: Cardiology following. Elevated LFTs: Hepatitis panel negative and likely related to sepsis with improvement. Acute hypoxic respiratory failure: CTA on admission was negative for PE or other acute pulmonary disease. Diabetes: A1c 6.5. History of right knee arthroplasty in 10/2018. Encephalopathy: Continues today and may be secondary to sepsis, however, would consider septic emboli in the setting of his bacteremia.  MRI brain had been ordered but was discontinued yesterday.  RECOMMENDATIONS:    Continue nafcillin Recommend TEE.  Have called cards master to schedule Recommend MRI brain.  Currently states he does not want to do have this done Appreciate neurosurgical intervention Follow-up repeat blood cultures and wound cultures Lab monitoring, glycemic control, wound care Monitor for other foci of infection Will follow   Principal Problem:   Staphylococcus aureus bacteremia Active Problems:   Acute respiratory failure with hypoxia (HCC)   GERD (gastroesophageal reflux disease)   Myotonia congenita   Hyperlipidemia   Intractable abdominal pain   Abdominal distension   Generalized abdominal pain   Postoperative wound infection   Ileus, postoperative (HCC)   Acute exacerbation of chronic low back pain   Ileus (HCC)   Abnormal  transaminases    MEDICATIONS:    Scheduled Meds: . aspirin  81 mg Oral Daily  . bisacodyl  10 mg Rectal Daily  . budesonide (PULMICORT) nebulizer solution  0.25 mg Nebulization BID  . Carbidopa-Levodopa ER  1 tablet Oral QID  . carvedilol  25 mg Oral BID WC  . Chlorhexidine Gluconate Cloth  6 each Topical Daily  . diltiazem  90 mg Oral Q6H  . docusate sodium  100 mg Oral BID  . heparin  5,000 Units Subcutaneous Q8H  . insulin aspart  0-9 Units Subcutaneous Q4H  . loratadine  10 mg Oral Daily  . metoCLOPramide (REGLAN) injection  10 mg Intravenous Q12H  . mupirocin ointment  1 application Nasal BID  . pantoprazole (PROTONIX) IV  40 mg Intravenous Q24H  . PARoxetine  40 mg Oral Daily   And  . PARoxetine  20 mg Oral QHS  . phenytoin (DILANTIN) IV  100 mg Intravenous Q8H  . senna  1 tablet Per Tube BID  . sodium chloride flush  3 mL Intravenous Q12H   Continuous Infusions: . sodium chloride Stopped (11/28/20 0549)  . diltiazem (CARDIZEM) infusion 20 mg/hr (11/29/20 0600)  . nafcillin (NAFCIL) continuous infusion 20.8 mL/hr at 11/29/20 0600   PRN Meds:.acetaminophen **OR** acetaminophen, albuterol, HYDROmorphone (DILAUDID) injection, hydrOXYzine, LORazepam, menthol-cetylpyridinium **OR** phenol, ondansetron **OR** ondansetron (ZOFRAN) IV, oxyCODONE, polyethylene glycol, sodium chloride flush, sodium phosphate  SUBJECTIVE:   24 hour events:  Afebrile, T-max over the last 24 hours 99.1 Heart rate improved Continues to need high flow nasal cannula at 10 L/min WBC 7.5, hemoglobin 9.5, platelets 133 Creatinine 0.9 LFTs improved Albumin 2.1 Hepatitis panel negative 9/18 blood cultures no growth Surgical  cultures with staph aureus, susceptibilities pending Continues on nafcillin.  MRI brain was canceled   His wife is not present in the room.  He remains confused.  He reports his abdominal pain is better and he had a bowel movement.  He thinks his back pain is better to although  still bothers him.  He does not have any other new joint pain.  No fevers.  No chest pain.  Review of Systems  All other systems reviewed and are negative.    OBJECTIVE:   Blood pressure (!) 168/82, pulse 80, temperature 99.1 F (37.3 C), temperature source Oral, resp. rate 10, height 5\' 4"  (1.626 m), weight 93.9 kg, SpO2 93 %. Body mass index is 35.53 kg/m.  Physical Exam Constitutional:      General: He is not in acute distress.    Appearance: He is ill-appearing.  HENT:     Head: Normocephalic and atraumatic.  Eyes:     Extraocular Movements: Extraocular movements intact.     Conjunctiva/sclera: Conjunctivae normal.  Cardiovascular:     Rate and Rhythm: Normal rate.  Abdominal:     General: There is distension.     Tenderness: There is no abdominal tenderness. There is no guarding or rebound.  Musculoskeletal:     Cervical back: Normal range of motion and neck supple.     Comments: Previous right TKA scar. Wound vac in place for back wound.   Skin:    General: Skin is warm and dry.     Findings: No rash.  Neurological:     General: No focal deficit present.     Mental Status: He is alert. He is disoriented.     Cranial Nerves: No cranial nerve deficit.  Psychiatric:        Mood and Affect: Mood normal.        Behavior: Behavior normal.     Lab Results: Lab Results  Component Value Date   WBC 7.5 11/29/2020   HGB 9.5 (L) 11/29/2020   HCT 27.8 (L) 11/29/2020   MCV 91.1 11/29/2020   PLT 133 (L) 11/29/2020    Lab Results  Component Value Date   NA 131 (L) 11/29/2020   K 3.9 11/29/2020   CO2 24 11/29/2020   GLUCOSE 133 (H) 11/29/2020   BUN 20 11/29/2020   CREATININE 0.92 11/29/2020   CALCIUM 8.3 (L) 11/29/2020   GFRNONAA >60 11/29/2020   GFRAA >60 01/27/2019    Lab Results  Component Value Date   ALT 13 11/29/2020   AST 145 (H) 11/29/2020   ALKPHOS 50 11/29/2020   BILITOT 1.9 (H) 11/29/2020       Component Value Date/Time   CRP 57.5 (H)  11/26/2020 0454       Component Value Date/Time   ESRSEDRATE 73 (H) 11/26/2020 0454     I have reviewed the micro and lab results in Epic.  Imaging: US Abdomen Limited  Result Date: 11/27/2020 CLINICAL DATA:  Elevated LFTs and abdominal pain EXAM: ULTRASOUND ABDOMEN LIMITED RIGHT UPPER QUADRANT COMPARISON:  11/23/2020 CT FINDINGS: Gallbladder: No gallstones or wall thickening visualized. No sonographic Murphy sign noted by sonographer. Common bile duct: Diameter: 4.36 mm Liver: Hypodensity seen on recent CT examination is not well appreciated on today's exam. Portal vein is patent on color Doppler imaging with normal direction of blood flow towards the liver. Other: Minimal free fluid is noted in the right upper quadrant. Right-sided pleural effusion is noted. IMPRESSION: Minimal free fluid in the abdomen Small right-sided  pleural effusion. No other focal abnormality is noted. Electronically Signed   By: Inez Catalina M.D.   On: 11/27/2020 22:11   DG Abd Portable 1V  Result Date: 11/28/2020 CLINICAL DATA:  Follow-up ileus EXAM: PORTABLE ABDOMEN - 1 VIEW COMPARISON:  11/27/2020 FINDINGS: Stable bowel gas pattern is noted throughout the large and small bowel consistent with the known history of ileus. No free air is seen. Postsurgical changes in the lumbar and thoracic spine are noted. No other focal abnormality is noted. IMPRESSION: Stable ileus pattern when compare with the prior day. Electronically Signed   By: Inez Catalina M.D.   On: 11/28/2020 15:31   DG Abd Portable 1V  Result Date: 11/27/2020 CLINICAL DATA:  Follow-up ileus EXAM: PORTABLE ABDOMEN - 1 VIEW COMPARISON:  11/26/2020 FINDINGS: Similar ileus pattern with gas in small and large bowel. Nasogastric tube enters the stomach. Previously seen rectal tube has been removed. No finding to suggest free air on the supine images. IMPRESSION: Persistent similar ileus pattern. Electronically Signed   By: Nelson Chimes M.D.   On: 11/27/2020 13:43      Imaging independently reviewed in Epic.    Raynelle [facility] for [facility] [phone] pager 11/29/2020, 8:48 AM  I spent greater than 35 minutes with the patient including greater than 50% of time in face to face counsel of the patient and in coordination of their care.

## 2020-11-29 NOTE — Progress Notes (Signed)
Request from ID for TEE for bacteremia. He is scheduled for TEE on Monday 12/03/20 at 12:30 with Dr. Marlou Porch.   Consent and orders to follow.   NPO at [facility] Sunday night.    Ledora Bottcher, PA-C 11/29/2020, 11:48 AM [phone] [facility]

## 2020-11-29 NOTE — Progress Notes (Signed)
Medication consultation: Phenytoin PO to IV   The patient Danny Hampton consult for phenytoin PO to IV  Other pertinent lab values include: No components found for: LABALBU Creatinine, Ser  Date Value Ref Range Status  11/29/2020 0.92 0.61 - 1.24 mg/dL Final    A/P Patient presented to ED with constipation.  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 His abdomen is distended. In addition, patient with MSSA bacteremia. Patient is NPO and Pharmacy asked to changed to IV. There are no significant interactions noted. Phenytoin and paroxetine share enzyme metabolism, however this is likely insignificant.  Continue Dilantin  100mg  IV q8h Check Phenytoin level the morning of 9/23 F/U and Monitor labs as indicated  Thank you for allowing pharmacy to participate in this patient's care.  Reatha Harps, PharmD PGY1 Pharmacy Resident 11/29/2020 10:22 AM Check [email or web address] for unit specific pharmacy number

## 2020-11-29 NOTE — Progress Notes (Signed)
Patient ID: Danny Hampton, adult   DOB: 27-May-1955, 65 y.o.   MRN: [id number]    Progress Note   Subjective   Day # 6  CC; Ileus-status post spine surgery 10/31/2020 fixation from T10-L3 Current wound infection status postdebridement 11/27/2020 MSSA bacteremia  KUB 9/21-stable diffuse ileus  WBC 7.5/hemoglobin 9.5/hematocrit 27.8 Sodium 131/potassium 3.9  Patient says he had a good bowel movement this morning couple of hours after suppository and passed a lot of gas.  He says his abdomen feels about the same, tight no severe pain.  He is tolerating the NG tube though uncomfortable.   Objective   Vital signs in last 24 hours: Temp:  [97.6 F (36.4 C)-99.1 F (37.3 C)] 99.1 F (37.3 C) (09/22 0750) Pulse Rate:  [74-120] 88 (09/22 0900) Resp:  [10-27] 19 (09/22 0900) BP: (107-168)/(62-122) 154/94 (09/22 0845) SpO2:  [75 %-100 %] 91 % (09/22 0900) Arterial Line BP: (112-198)/(68-129) 167/83 (09/22 0900) Weight:  [93.9 kg] 93.9 kg (09/22 0447) Last BM Date: 11/29/20 General:    Older white male in NAD Heart:  Regular rate and rhythm; no murmurs Lungs: Respirations even and unlabored, lungs CTA bilaterally Abdomen: Large, distended, no focal tenderness, bowel sounds are present but high-pitched and tinkling. Normal bowel sounds. Extremities:  Without edema. Neurologic:  Alert and oriented,  grossly normal neurologically. Psych:  Cooperative.   Intake/Output from previous day: 09/21 0701 - 09/22 0700 In: 2166.4 [I.V.:485.1; IV Piggyback:1681.3] Out: 1275 [Urine:1075; Emesis/NG output:100; Drains:100] Intake/Output this shift: Total I/O In: 81.8 [I.V.:40.1; IV Piggyback:41.7] Out: -   Lab Results: Recent Labs    11/27/20 0348 11/28/20 0418 11/29/20 0436  WBC 5.8 5.8 7.5  HGB 10.0* 9.6* 9.5*  HCT 28.7* 28.4* 27.8*  PLT 77* 97* 133*   BMET Recent Labs    11/27/20 0348 11/28/20 0418 11/29/20 0436  NA 130* 135 131*  K 4.4 3.7 3.9  CL 95* 97* 96*  CO2 21* 22  24  GLUCOSE 128* 181* 133*  BUN 23 16 20   CREATININE 1.05 1.08 0.92  CALCIUM 8.6* 8.4* 8.3*   LFT Recent Labs    11/29/20 0436  PROT 5.3*  ALBUMIN 2.1*  AST 145*  ALT 13  ALKPHOS 50  BILITOT 1.9*   PT/INR No results for input(s): LABPROT, INR in the last 72 hours.  Studies/Results: US Abdomen Limited  Result Date: 11/27/2020 CLINICAL DATA:  Elevated LFTs and abdominal pain EXAM: ULTRASOUND ABDOMEN LIMITED RIGHT UPPER QUADRANT COMPARISON:  11/23/2020 CT FINDINGS: Gallbladder: No gallstones or wall thickening visualized. No sonographic Murphy sign noted by sonographer. Common bile duct: Diameter: 4.36 mm Liver: Hypodensity seen on recent CT examination is not well appreciated on today's exam. Portal vein is patent on color Doppler imaging with normal direction of blood flow towards the liver. Other: Minimal free fluid is noted in the right upper quadrant. Right-sided pleural effusion is noted. IMPRESSION: Minimal free fluid in the abdomen Small right-sided pleural effusion. No other focal abnormality is noted. Electronically Signed   By: Inez Catalina M.D.   On: 11/27/2020 22:11   DG Abd Portable 1V  Result Date: 11/28/2020 CLINICAL DATA:  Follow-up ileus EXAM: PORTABLE ABDOMEN - 1 VIEW COMPARISON:  11/27/2020 FINDINGS: Stable bowel gas pattern is noted throughout the large and small bowel consistent with the known history of ileus. No free air is seen. Postsurgical changes in the lumbar and thoracic spine are noted. No other focal abnormality is noted. IMPRESSION: Stable ileus pattern when compare with the  prior day. Electronically Signed   By: Inez Catalina M.D.   On: 11/28/2020 15:31   DG Abd Portable 1V  Result Date: 11/27/2020 CLINICAL DATA:  Follow-up ileus EXAM: PORTABLE ABDOMEN - 1 VIEW COMPARISON:  11/26/2020 FINDINGS: Similar ileus pattern with gas in small and large bowel. Nasogastric tube enters the stomach. Previously seen rectal tube has been removed. No finding to suggest  free air on the supine images. IMPRESSION: Persistent similar ileus pattern. Electronically Signed   By: Nelson Chimes M.D.   On: 11/27/2020 13:43   [place] EKG SITE RITE  Result Date: 11/29/2020 If [facility] image not attached, placement could not be confirmed due to current cardiac rhythm.      Assessment / Plan:    #28 65 year old white male with diffuse small and large bowel ileus in setting of recent spine surgery, now complicated by wound infection with MSSA bacteremia/sepsis  He is stable, perhaps some mild improvement as he did have a bowel movement and flatus today  #2 new atrial fib #3 new diagnosis of adult onset diabetes mellitus #4 acute kidney injury #5 elevated LFTs-likely reactive secondary to sepsis-improving #6 encephalopathy   Plan; leaving NG.  There is not much output Start MiraLAX 17 g in 6 ounces of water daily via the NG Tapwater enema today, may benefit from at least daily tapwater enemas over the next couple of days Maintain normal potassium and magnesium as doing Continue IV Reglan     Principal Problem:   Staphylococcus aureus bacteremia Active Problems:   Acute respiratory failure with hypoxia (HCC)   GERD (gastroesophageal reflux disease)   Myotonia congenita   Hyperlipidemia   Intractable abdominal pain   Abdominal distension   Generalized abdominal pain   Postoperative wound infection   Ileus, postoperative (HCC)   Acute exacerbation of chronic low back pain   Ileus (HCC)   Abnormal transaminases     LOS: 5 days   Rosemarie Galvis PA-C 11/29/2020, 10:33 AM

## 2020-11-29 NOTE — Progress Notes (Signed)
Peripherally Inserted Central Catheter Placement  The IV Nurse has discussed with the patient and/or persons authorized to consent for the patient, the purpose of this procedure and the potential benefits and risks involved with this procedure.  The benefits include less needle sticks, lab draws from the catheter, and the patient may be discharged home with the catheter. Risks include, but not limited to, infection, bleeding, blood clot (thrombus formation), and puncture of an artery; nerve damage and irregular heartbeat and possibility to perform a PICC exchange if needed/ordered by physician.  Alternatives to this procedure were also discussed.  Bard Power PICC patient education guide, fact sheet on infection prevention and patient information card has been provided to patient /or left at bedside.    PICC Placement Documentation  PICC Double Lumen 29/29/09 PICC Right Basilic 40 cm 0 cm (Active)  Indication for Insertion or Continuance of Line Prolonged intravenous therapies 11/29/20 1500  Exposed Catheter (cm) 0 cm 11/29/20 1500  Site Assessment Clean;Dry;Intact 11/29/20 1500  Lumen #1 Status Flushed;Saline locked;Blood return noted 11/29/20 1500  Lumen #2 Status Flushed;Saline locked;Blood return noted 11/29/20 1500  Dressing Type Transparent;Securing device 11/29/20 1500  Dressing Status Clean;Dry;Intact 11/29/20 1500  Antimicrobial disc in place? Yes 11/29/20 1500  Line Care Connections checked and tightened 11/29/20 1500  Dressing Intervention New dressing 11/29/20 1500  Dressing Change Due 12/06/20 11/29/20 1500       Holley Bouche [place] 11/29/2020, 3:26 PM

## 2020-11-29 NOTE — Progress Notes (Signed)
IP rehab admissions - I spoke with patient's significant other on the phone about rehab.  SO prefers SNF placement.  She is disabled and on a walker herself and was already struggling with patient's care over the past 2 weeks.  She feels patient will need a longer time to reach the level where she can manage at home.  Recommend pursuit of SNF placement once patient is medically ready.  Call for questions.  [phone]

## 2020-11-29 NOTE — TOC Progression Note (Addendum)
Transition of Care [facility]) - Progression Note    Patient Details  Name: Danny Hampton MRN: [id number] Date of Birth: 11-05-1955  Transition of Care [facility] CM/SW [place], LCSW Phone Number: 11/29/2020, 4:35 PM  Clinical Narrative:    CSW spoke with patient's significant other regarding her request for SNF placement. She stated she is not able to care for patient as he has been requiring total assist. Patient has [employer] insurance through [place], [place] but she is not certain if he is completely service connected. Patient also has Medicare Part A but not Part B. CSW explained that Part B is what pays for therapy, so patient could go to SNF under Part A but would likely not receive any therapies. Juliann Pulse reported that she really needs patient to be able to have therapy. CSW contacted [facility] but they are closed for the day. CSW will call back tomorrow to see if patient is eligible for rehab. Patient has not received any Covid vaccines.    Expected Discharge Plan: Skilled Nursing Facility Barriers to Discharge: Ship broker, Continued Medical Work up, SNF Pending bed offer  Expected Discharge Plan and Services Expected Discharge Plan: [place] In-house Referral: Clinical Social Work   Post Acute Care Choice: [place] Living arrangements for the past 2 months: Single Family Home                           HH Arranged: IV Antibiotics HH Agency: [facility] Date HH Agency Contacted: 11/25/20 Time [place]: 83 Representative spoke with at [employer]: [place] ([place]) Interventions    Readmission Risk Interventions No flowsheet data found.

## 2020-11-29 NOTE — Progress Notes (Addendum)
NAME:  Danny Hampton, MRN:  [id number], DOB:  1955/03/12, LOS: 5 ADMISSION DATE:  11/23/2020, CONSULTATION DATE: 11/27/2020 REFERRING MD: [place], CHIEF COMPLAINT: Back and abdominal pain  History of Present Illness:  65 year old with a extensive past medical history is well-documented below.  He had spinal surgery August 24 of this year and returns with chief complaints of abdominal distention significant for ileus.  He was transferred from [facility] to [facility] for further evaluation by neurosurgery Dr. Saintclair Halsted due to his initial surgery.  He is positive for MSSA bacteremia for which he is on nafcillin.  If further develop atrial fibrillation with rapid ventricular response to 170s and is proven refractory to diltiazem drip.  He is noted to have a mottled lower extremities appears to be decompressing and will be transferred to intensive care unit for further evaluation and treatment  Significant Hospital Events: Including procedures, antibiotic start and stop dates in addition to other pertinent events   11/27/2020 transfer intensive care unit  Interim History / Subjective:  Patient converted to sinus rhythm, now heart rate is in 90s Blood pressure remains elevated Had large bowel movement this morning  Objective   Blood pressure (!) 154/94, pulse 88, temperature 99.1 F (37.3 C), temperature source Oral, resp. rate 19, height 5\' 4"  (1.626 m), weight 93.9 kg, SpO2 91 %.        Intake/Output Summary (Last 24 hours) at 11/29/2020 1016 Last data filed at 11/29/2020 0900 Gross per 24 hour  Intake 2171.8 ml  Output 650 ml  Net 1521.8 ml   Filed Weights   11/27/20 0454 11/28/20 0500 11/29/20 0447  Weight: 94.6 kg 94.2 kg 93.9 kg    Examination:   Physical exam: General: Crtitically ill-appearing morbidly obese male, lying in the bed on nasal cannula oxygen HEENT: [place]/AT, eyes anicteric. OGT in place Neuro: Alert, awake, following commands, moving all 4  extremities Chest: Coarse breath sounds, no wheezes or rhonchi Heart: Regular rate and rhythm, no murmurs or gallops Abdomen: Soft, distended, nontender, hyperactive bowel sounds present Skin: No rash  Resolved Hospital Problem list     Assessment & Plan:  Sepsis due to surgical wound infection with MSSA s/p I&D and washout MSSA bacteremia  Continue nafcillin per discussion with infectious disease Discontinue IV fluid Repeat blood cultures are negative Surgical wound culture also growing MSSA TEE was negative for vegetations  New diagnosis of atrial fibrillation rapid ventricular response  Hypertension, uncontrolled Patient converted to sinus rhythm last night Still on diltiazem infusion, will titrated off Continue oral diltiazem 90 mg every 6 hours and Coreg 25 mg twice daily Patient has a high CHA2DS2-VASc score, unable to anticoagulate due to recent procedure  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 General surgery and GI is following He had 1 large bowel movement this morning We will give him rectal suppository again Keeping serum potassium close to 4 and magnesium close to 2  New diagnosis of diabetes with hyperglycemia Blood sugars are better controlled Continue sliding scale insulin and Levemir  Hyponatremia Serum sodium is improving Closely monitor  Acute kidney injury Serum creatinine has improved after IV fluid therapy, closely monitor Monitor intake and output Avoid nephrotoxic agents  Acute hepatitis due to severe sepsis Initially LFTs trended up, now downtrending with sepsis under control Monitor LFTs  Morbid obesity Dietitian follow-up   Diet/type: NPO DVT prophylaxis: prophylactic heparin  GI prophylaxis: N/A Lines: N/A Foley:  N/A Code Status:  full code Last date of multidisciplinary goals  of care discussion [9/21: Spoke with patient at bedside, he would like to continue full aggressive care]  Labs   CBC: Recent Labs  Lab  11/25/20 0306 11/26/20 0454 11/27/20 0348 11/28/20 0418 11/29/20 0436  WBC 6.8 6.4 5.8 5.8 7.5  NEUTROABS 6.1 5.3 4.6 4.3 5.2  HGB 9.6* 9.8* 10.0* 9.6* 9.5*  HCT 28.1* 29.5* 28.7* 28.4* 27.8*  MCV 92.7 95.5 92.0 92.2 91.1  PLT 104* 83* 77* 97* 133*    Basic Metabolic Panel: Recent Labs  Lab 11/24/20 0546 11/25/20 0306 11/26/20 0454 11/27/20 0348 11/27/20 0851 11/28/20 0418 11/29/20 0436  NA 130* 124* 124* 130*  --  135 131*  K 3.7 3.2* 4.5 4.4  --  3.7 3.9  CL 89* 90* 91* 95*  --  97* 96*  CO2 32 25 22 21*  --  22 24  GLUCOSE 175* 154* 138* 128*  --  181* 133*  BUN 16 14 25* 23  --  16 20  CREATININE 0.78 0.79 1.36* 1.05  --  1.08 0.92  CALCIUM 8.6* 7.4* 7.6* 8.6*  --  8.4* 8.3*  MG 1.5* 1.9  --   --  1.9 1.9  --   PHOS  --   --   --   --  2.4* 2.5  --    GFR: Estimated Creatinine Clearance (by C-G formula based on SCr of 0.92 mg/dL) Male: 67.8 mL/min Male: 82.8 mL/min Recent Labs  Lab 11/24/20 0546 11/24/20 0816 11/24/20 1157 11/24/20 1535 11/25/20 0306 11/26/20 0454 11/27/20 0348 11/27/20 0851 11/27/20 1013 11/28/20 0418 11/29/20 0436  PROCALCITON 1.14  --   --   --  2.44 3.95  --   --   --   --   --   WBC 11.9*  --   --   --  6.8 6.4 5.8  --   --  5.8 7.5  LATICACIDVEN 2.5*   < > 2.6* 1.7  --   --   --  1.8 1.9  --   --    < > = values in this interval not displayed.    Liver Function Tests: Recent Labs  Lab 11/25/20 0306 11/26/20 0454 11/27/20 0348 11/28/20 0418 11/29/20 0436  AST 41 330* 393* 296* 145*  ALT 9 13 77* 103* 13  ALKPHOS 60 55 50 53 50  BILITOT 0.6 0.8 1.0 1.7* 1.9*  PROT 5.6* 6.4* 5.9* 5.8* 5.3*  ALBUMIN 2.7* 2.8* 2.5* 2.3* 2.1*   Recent Labs  Lab 11/27/20 0851  LIPASE 48   No results for input(s): AMMONIA in the last 168 hours.  ABG No results found for: PHART, PCO2ART, PO2ART, HCO3, TCO2, ACIDBASEDEF, O2SAT   Coagulation Profile: No results for input(s): INR, PROTIME in the last 168 hours.  Cardiac  Enzymes: No results for input(s): CKTOTAL, CKMB, CKMBINDEX, TROPONINI in the last 168 hours.  HbA1C: Hgb A1c MFr Bld  Date/Time Value Ref Range Status  10/22/2020 10:48 AM 6.5 (H) 4.8 - 5.6 % Final    Comment:    (NOTE) Pre diabetes:          5.7%-6.4%  Diabetes:              >6.4%  Glycemic control for   <7.0% adults with diabetes   04/22/2020 06:05 AM 6.7 (H) 4.8 - 5.6 % Final    Comment:    (NOTE) Pre diabetes:          5.7%-6.4%  Diabetes:              >  6.4%  Glycemic control for   <7.0% adults with diabetes     CBG: Recent Labs  Lab 11/28/20 1922 11/28/20 2326 11/29/20 0317 11/29/20 0748 11/29/20 0821  GLUCAP 132* 106* 116* 149* 144*    Total critical care time: 36 minutes  Performed by: [place] care time was exclusive of separately billable procedures and treating other patients.   Critical care was necessary to treat or prevent imminent or life-threatening deterioration.   Critical care was time spent personally by me on the following activities: development of treatment plan with patient and/or surrogate as well as nursing, discussions with consultants, evaluation of patient's response to treatment, examination of patient, obtaining history from patient or surrogate, ordering and performing treatments and interventions, ordering and review of laboratory studies, ordering and review of radiographic studies, pulse oximetry and re-evaluation of patient's condition.   Jacky Kindle MD [facility] See Amion for pager If no response to pager, please call [phone] until 7pm After 7pm, Please call E-link [phone]

## 2020-11-29 NOTE — Progress Notes (Signed)
Patient ID: Danny Hampton, adult   DOB: 10-15-1955, 65 y.o.   MRN: [id number]  [facility] Progress Note  2 Days Post-Op  Subjective: CC-  Still distended but feeling a little better than yesterday. Started passing some flatus and had a BM. Less abdominal pain. NG tube with low output.   Objective: Vital signs in last 24 hours: Temp:  [97.6 F (36.4 C)-99.1 F (37.3 C)] 97.6 F (36.4 C) (09/22 1111) Pulse Rate:  [74-120] 88 (09/22 0900) Resp:  [10-27] 19 (09/22 0900) BP: (107-168)/(62-122) 154/94 (09/22 0845) SpO2:  [75 %-100 %] 91 % (09/22 0900) Arterial Line BP: (112-198)/(68-129) 167/83 (09/22 0900) Weight:  [93.9 kg] 93.9 kg (09/22 0447) Last BM Date: 11/29/20  Intake/Output from previous day: 09/21 0701 - 09/22 0700 In: 2166.4 [I.V.:485.1; IV Piggyback:1681.3] Out: 1275 [Urine:1075; Emesis/NG output:100; Drains:100] Intake/Output this shift: Total I/O In: 81.8 [I.V.:40.1; IV Piggyback:41.7] Out: -   PE: Gen:  Alert, NAD, pleasant Card:  tachy Pulm:  rate and effort normal Abd: distended but soft, mild diffuse tenderness without rebound or guarding (improved from yesterday), hypoactive BS, no masses, hernias, or organomegaly  Lab Results:  Recent Labs    11/28/20 0418 11/29/20 0436  WBC 5.8 7.5  HGB 9.6* 9.5*  HCT 28.4* 27.8*  PLT 97* 133*   BMET Recent Labs    11/28/20 0418 11/29/20 0436  NA 135 131*  K 3.7 3.9  CL 97* 96*  CO2 22 24  GLUCOSE 181* 133*  BUN 16 20  CREATININE 1.08 0.92  CALCIUM 8.4* 8.3*   PT/INR No results for input(s): LABPROT, INR in the last 72 hours. CMP     Component Value Date/Time   NA 131 (L) 11/29/2020 0436   K 3.9 11/29/2020 0436   CL 96 (L) 11/29/2020 0436   CO2 24 11/29/2020 0436   GLUCOSE 133 (H) 11/29/2020 0436   BUN 20 11/29/2020 0436   CREATININE 0.92 11/29/2020 0436   CALCIUM 8.3 (L) 11/29/2020 0436   PROT 5.3 (L) 11/29/2020 0436   ALBUMIN 2.1 (L) 11/29/2020 0436   AST 145 (H)  11/29/2020 0436   ALT 13 11/29/2020 0436   ALKPHOS 50 11/29/2020 0436   BILITOT 1.9 (H) 11/29/2020 0436   GFRNONAA >60 11/29/2020 0436   GFRAA >60 01/27/2019 1014   Lipase     Component Value Date/Time   LIPASE 48 11/27/2020 0851       Studies/Results: US Abdomen Limited  Result Date: 11/27/2020 CLINICAL DATA:  Elevated LFTs and abdominal pain EXAM: ULTRASOUND ABDOMEN LIMITED RIGHT UPPER QUADRANT COMPARISON:  11/23/2020 CT FINDINGS: Gallbladder: No gallstones or wall thickening visualized. No sonographic Murphy sign noted by sonographer. Common bile duct: Diameter: 4.36 mm Liver: Hypodensity seen on recent CT examination is not well appreciated on today's exam. Portal vein is patent on color Doppler imaging with normal direction of blood flow towards the liver. Other: Minimal free fluid is noted in the right upper quadrant. Right-sided pleural effusion is noted. IMPRESSION: Minimal free fluid in the abdomen Small right-sided pleural effusion. No other focal abnormality is noted. Electronically Signed   By: Danny Hampton M.D.   On: 11/27/2020 22:11   DG Abd Portable 1V  Result Date: 11/28/2020 CLINICAL DATA:  Follow-up ileus EXAM: PORTABLE ABDOMEN - 1 VIEW COMPARISON:  11/27/2020 FINDINGS: Stable bowel gas pattern is noted throughout the large and small bowel consistent with the known history of ileus. No free air is seen. Postsurgical changes in the lumbar and thoracic  spine are noted. No other focal abnormality is noted. IMPRESSION: Stable ileus pattern when compare with the prior day. Electronically Signed   By: Danny Hampton M.D.   On: 11/28/2020 15:31   [place] EKG SITE RITE  Result Date: 11/29/2020 If [facility] image not attached, placement could not be confirmed due to current cardiac rhythm.   Anti-infectives: Anti-infectives (From admission, onward)    Start     Dose/Rate Hampton Frequency Ordered Stop   11/27/20 2145  ceFAZolin (ANCEF) IVPB 2g/100 mL premix        2 g 200 mL/hr  over 30 Minutes Intravenous Every 8 hours 11/27/20 2052 11/28/20 0619   11/27/20 1200  nafcillin 12 g in sodium chloride 0.9 % 500 mL continuous infusion        12 g 20.8 mL/hr over 24 Hours Intravenous Every 24 hours 11/27/20 1007     11/25/20 1400  ceFAZolin (ANCEF) IVPB 2g/100 mL premix  Status:  Discontinued        2 g 200 mL/hr over 30 Minutes Intravenous Every 8 hours 11/25/20 0915 11/27/20 1007   11/24/20 1400  piperacillin-tazobactam (ZOSYN) IVPB 3.375 g  Status:  Discontinued       See Hyperspace for full Linked Orders Report.   3.375 g 12.5 mL/hr over 240 Minutes Intravenous Every 8 hours 11/24/20 0755 11/25/20 0915   11/24/20 0800  piperacillin-tazobactam (ZOSYN) IVPB 3.375 g       See Hyperspace for full Linked Orders Report.   3.375 g 100 mL/hr over 30 Minutes Intravenous  Once 11/24/20 0755 11/24/20 0925        Assessment/Plan Ileus with small bowel and colonic distension - xray yesterday with stable ileus pattern. Patient has refused repeat CT because he does not want to leave his room for a scan - Clinically somewhat improved today. He is still very distended but has started passing gas and had a BM. Continue NG and daily suppositories. GI considering neostigmine. No role for surgical intervention.    ID - zosyn 9/17>>9/18, ancef 9/18>>9/20, nafcillin 9/20>> VTE - sq heparin FEN - NPO/NGT to LIWS Foley - none   S/p back surgery 10/31/20 by Dr. Saintclair Hampton  S/p Debridement of postoperative lumbar wound infection 11/27/20 Dr. Ellene Hampton MSSA bacteremia A fib RVR GERD HTN Hypontonia congenita DM   LOS: 5 days    Danny Hampton, Danny Hampton Surgery 11/29/2020, 11:25 AM Please see Amion for pager number during day hours 7:00am-4:30pm

## 2020-11-30 ENCOUNTER — Inpatient Hospital Stay (HOSPITAL_COMMUNITY): Payer: No Typology Code available for payment source

## 2020-11-30 DIAGNOSIS — K567 Ileus, unspecified: Secondary | ICD-10-CM | POA: Diagnosis not present

## 2020-11-30 DIAGNOSIS — B9561 Methicillin susceptible Staphylococcus aureus infection as the cause of diseases classified elsewhere: Secondary | ICD-10-CM | POA: Diagnosis not present

## 2020-11-30 DIAGNOSIS — R7881 Bacteremia: Secondary | ICD-10-CM | POA: Diagnosis not present

## 2020-11-30 DIAGNOSIS — R7989 Other specified abnormal findings of blood chemistry: Secondary | ICD-10-CM | POA: Diagnosis not present

## 2020-11-30 DIAGNOSIS — R0902 Hypoxemia: Secondary | ICD-10-CM | POA: Diagnosis not present

## 2020-11-30 DIAGNOSIS — T8149XA Infection following a procedure, other surgical site, initial encounter: Secondary | ICD-10-CM | POA: Diagnosis not present

## 2020-11-30 DIAGNOSIS — J9601 Acute respiratory failure with hypoxia: Secondary | ICD-10-CM | POA: Diagnosis not present

## 2020-11-30 DIAGNOSIS — M545 Low back pain, unspecified: Secondary | ICD-10-CM | POA: Diagnosis not present

## 2020-11-30 DIAGNOSIS — I483 Typical atrial flutter: Secondary | ICD-10-CM | POA: Diagnosis not present

## 2020-11-30 LAB — GLUCOSE, CAPILLARY
Glucose-Capillary: 114 mg/dL — ABNORMAL HIGH (ref 70–99)
Glucose-Capillary: 117 mg/dL — ABNORMAL HIGH (ref 70–99)
Glucose-Capillary: 118 mg/dL — ABNORMAL HIGH (ref 70–99)
Glucose-Capillary: 130 mg/dL — ABNORMAL HIGH (ref 70–99)
Glucose-Capillary: 130 mg/dL — ABNORMAL HIGH (ref 70–99)
Glucose-Capillary: 142 mg/dL — ABNORMAL HIGH (ref 70–99)

## 2020-11-30 LAB — CULTURE, BLOOD (ROUTINE X 2)
Culture: NO GROWTH
Culture: NO GROWTH
Special Requests: ADEQUATE
Special Requests: ADEQUATE

## 2020-11-30 LAB — CBC WITH DIFFERENTIAL/PLATELET
Abs Immature Granulocytes: 0.22 10*3/uL — ABNORMAL HIGH (ref 0.00–0.07)
Basophils Absolute: 0 10*3/uL (ref 0.0–0.1)
Basophils Relative: 0 %
Eosinophils Absolute: 0.2 10*3/uL (ref 0.0–0.5)
Eosinophils Relative: 2 %
HCT: 27.7 % — ABNORMAL LOW (ref 39.0–52.0)
Hemoglobin: 9.5 g/dL — ABNORMAL LOW (ref 13.0–17.0)
Immature Granulocytes: 3 %
Lymphocytes Relative: 13 %
Lymphs Abs: 0.9 10*3/uL (ref 0.7–4.0)
MCH: 31.8 pg (ref 26.0–34.0)
MCHC: 34.3 g/dL (ref 30.0–36.0)
MCV: 92.6 fL (ref 80.0–100.0)
Monocytes Absolute: 1 10*3/uL (ref 0.1–1.0)
Monocytes Relative: 14 %
Neutro Abs: 4.5 10*3/uL (ref 1.7–7.7)
Neutrophils Relative %: 68 %
Platelets: 164 10*3/uL (ref 150–400)
RBC: 2.99 MIL/uL — ABNORMAL LOW (ref 4.22–5.81)
RDW: 13.1 % (ref 11.5–15.5)
WBC: 6.7 10*3/uL (ref 4.0–10.5)
nRBC: 0 % (ref 0.0–0.2)

## 2020-11-30 LAB — PHENYTOIN LEVEL, TOTAL: Phenytoin Lvl: 4.5 ug/mL — ABNORMAL LOW (ref 10.0–20.0)

## 2020-11-30 IMAGING — DX DG ABDOMEN 1V
2 series · 2 of 2 positions shown · non-contrast
Comparison: [DATE]

CLINICAL DATA: Ileus

EXAM:
ABDOMEN - 1 VIEW

[abdomen kub (1 of 2)]
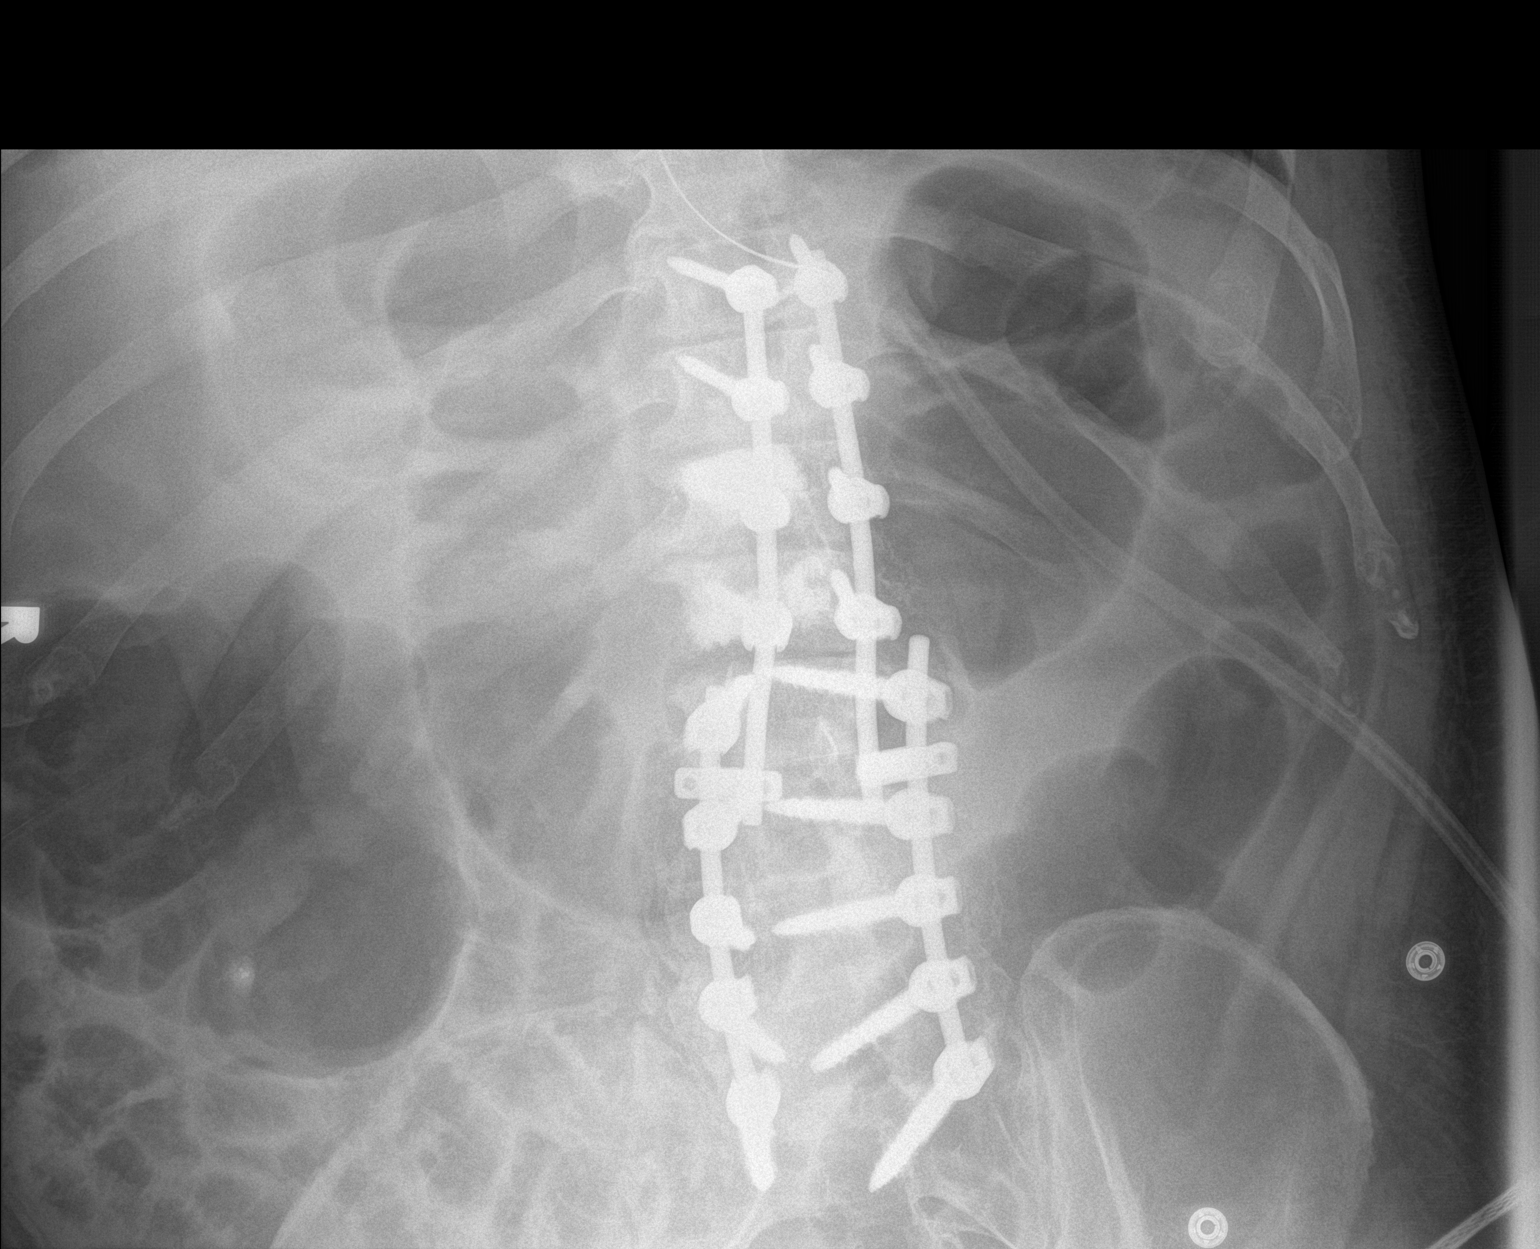

[abdomen kub (2 of 2)]
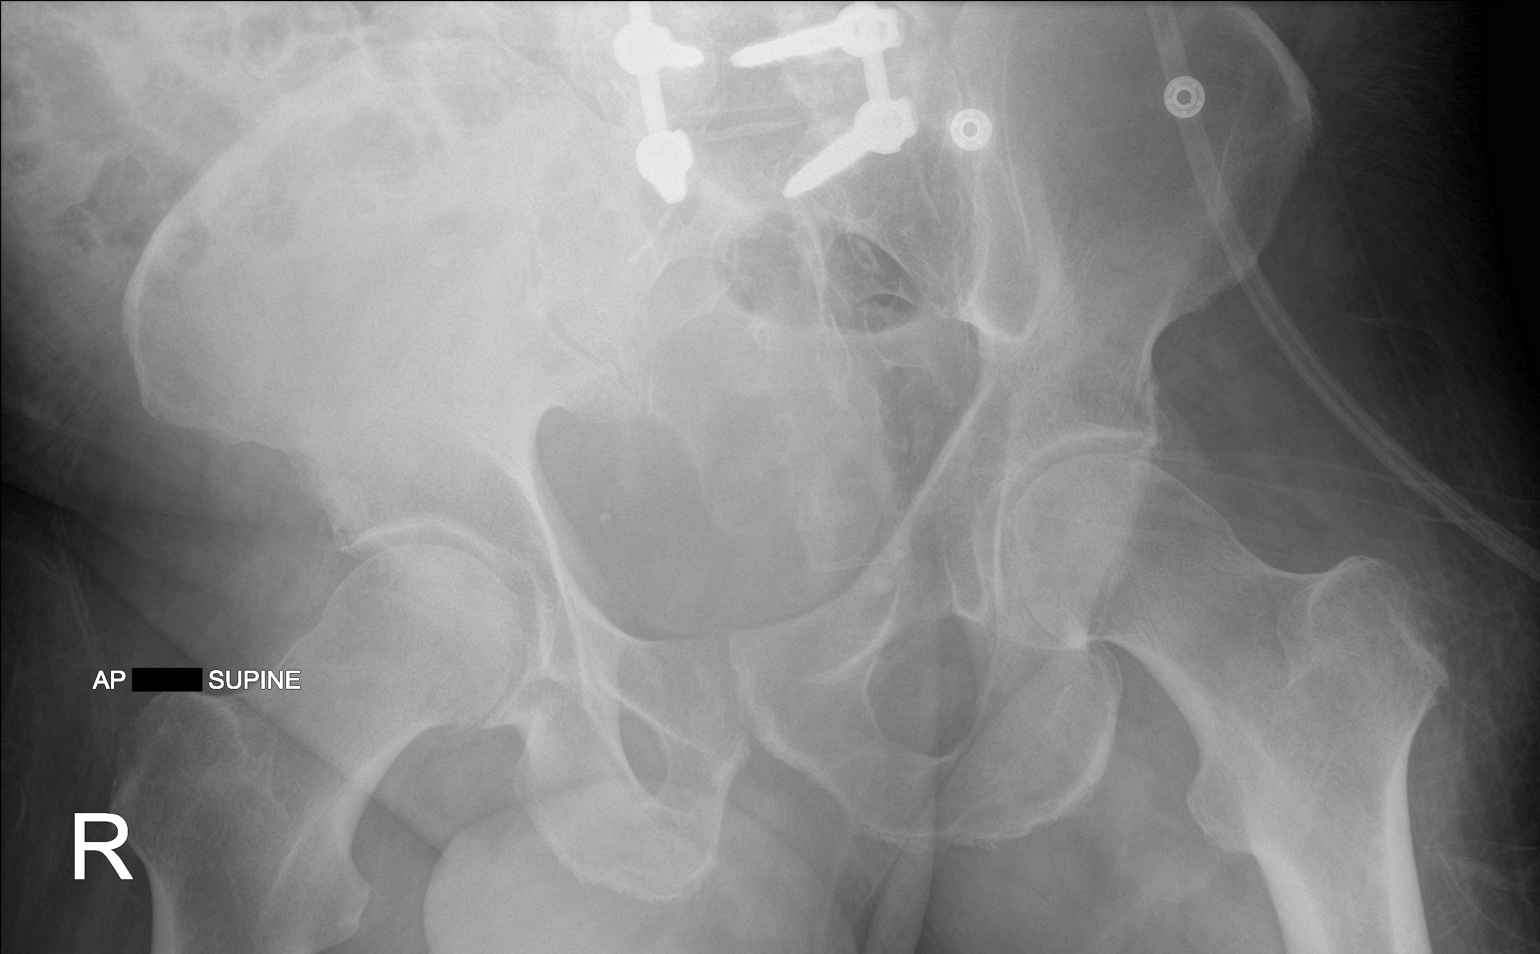

[2 of 2 positions shown; findings below may reference images not displayed]

FINDINGS: Persistent air distended large and small bowel loops throughout the
abdomen, similar in appearance to the previous study. No gross free
intraperitoneal air on portable supine view. Enteric tube tip
terminates just beyond the level of the GE junction. Extensive
spinal fusion hardware.
IMPRESSION: 1. Persistent air distended large and small bowel loops throughout
the abdomen, similar in appearance to the previous study and
suggestive of ileus.
2. Enteric tube tip terminates just beyond the level of the GE
junction.

## 2020-11-30 MED ORDER — SODIUM CHLORIDE 0.9 % IV SOLN
200.0000 mg | Freq: Every day | INTRAVENOUS | Status: DC
  Administered 2020-11-30 – 2020-12-03 (×4): 200 mg via INTRAVENOUS
  Filled 2020-11-30 (×6): qty 4

## 2020-11-30 MED ORDER — PHENYTOIN SODIUM 50 MG/ML IJ SOLN
100.0000 mg | Freq: Two times a day (BID) | INTRAMUSCULAR | Status: DC
  Administered 2020-11-30 – 2020-12-04 (×8): 100 mg via INTRAVENOUS
  Filled 2020-11-30 (×8): qty 2

## 2020-11-30 NOTE — Progress Notes (Signed)
Progress Note  Patient Name: Danny Hampton Date of Encounter: 11/30/2020  [facility] Cardiologist: Rozann Lesches, MD   Subjective   Feels better  Inpatient Medications    Scheduled Meds:  aspirin  81 mg Oral Daily   bisacodyl  10 mg Rectal Daily   budesonide (PULMICORT) nebulizer solution  0.25 mg Nebulization BID   Carbidopa-Levodopa ER  1 tablet Oral QID   carvedilol  25 mg Oral BID WC   Chlorhexidine Gluconate Cloth  6 each Topical Daily   diltiazem  90 mg Oral Q6H   docusate sodium  100 mg Oral BID   heparin  5,000 Units Subcutaneous Q8H   insulin aspart  0-9 Units Subcutaneous Q4H   lisinopril  40 mg Per Tube Daily   loratadine  10 mg Oral Daily   metoCLOPramide (REGLAN) injection  10 mg Intravenous Q12H   mupirocin ointment  1 application Nasal BID   pantoprazole (PROTONIX) IV  40 mg Intravenous Q24H   PARoxetine  40 mg Oral Daily   And   PARoxetine  20 mg Oral QHS   phenytoin (DILANTIN) IV  100 mg Intravenous BID   polyethylene glycol  17 g Per Tube Daily   senna  1 tablet Per Tube BID   sodium chloride flush  10-40 mL Intracatheter Q12H   sodium chloride flush  3 mL Intravenous Q12H   Continuous Infusions:  sodium chloride Stopped (11/28/20 0549)   nafcillin (NAFCIL) continuous infusion 12 g (11/30/20 1204)   phenytoin (DILANTIN) IV     PRN Meds: acetaminophen **OR** acetaminophen, albuterol, HYDROmorphone (DILAUDID) injection, hydrOXYzine, LORazepam, menthol-cetylpyridinium **OR** phenol, ondansetron **OR** ondansetron (ZOFRAN) IV, oxyCODONE, sodium chloride flush, sodium chloride flush, sodium phosphate   Vital Signs    Vitals:   11/30/20 0800 11/30/20 0900 11/30/20 1000 11/30/20 1100  BP: 124/78 134/89 116/73   Pulse: 88 92 84 81  Resp: 11 (!) 22 13 (!) 9  Temp: 98.4 F (36.9 C)     TempSrc: Oral     SpO2: 97% 96% 94% 96%  Weight:      Height:        Intake/Output Summary (Last 24 hours) at 11/30/2020 1208 Last data filed at  11/30/2020 1200 Gross per 24 hour  Intake 638.65 ml  Output 1350 ml  Net -711.35 ml   Last 3 Weights 11/30/2020 11/29/2020 11/28/2020  Weight (lbs) 207 lb 10.8 oz 207 lb 0.2 oz 207 lb 10.8 oz  Weight (kg) 94.2 kg 93.9 kg 94.2 kg      Telemetry    NSR - Personally Reviewed  ECG      Physical Exam   GEN: No acute distress.  NG tube in place Neck: No JVD Cardiac: RRR, no murmurs, rubs, or gallops.  Respiratory: Clear to auscultation bilaterally. GI: Soft, nontender, non-distended  MS: No edema; No deformity. Neuro:  Nonfocal  Psych: Normal affect   Labs    High Sensitivity Troponin:  No results for input(s): TROPONINIHS in the last 720 hours.   Chemistry Recent Labs  Lab 11/25/20 0306 11/26/20 0454 11/27/20 0348 11/27/20 0851 11/28/20 0418 11/29/20 0436  NA 124*   < > 130*  --  135 131*  K 3.2*   < > 4.4  --  3.7 3.9  CL 90*   < > 95*  --  97* 96*  CO2 25   < > 21*  --  22 24  GLUCOSE 154*   < > 128*  --  181*  133*  BUN 14   < > 23  --  16 20  CREATININE 0.79   < > 1.05  --  1.08 0.92  CALCIUM 7.4*   < > 8.6*  --  8.4* 8.3*  MG 1.9  --   --  1.9 1.9  --   PROT 5.6*   < > 5.9*  --  5.8* 5.3*  ALBUMIN 2.7*   < > 2.5*  --  2.3* 2.1*  AST 41   < > 393*  --  296* 145*  ALT 9   < > 77*  --  103* 13  ALKPHOS 60   < > 50  --  53 50  BILITOT 0.6   < > 1.0  --  1.7* 1.9*  GFRNONAA >60   < > >60  --  >60 >60  ANIONGAP 9   < > 14  --  16* 11   < > = values in this interval not displayed.    Lipids No results for input(s): CHOL, TRIG, HDL, LABVLDL, LDLCALC, CHOLHDL in the last 168 hours.  Hematology Recent Labs  Lab 11/28/20 0418 11/29/20 0436 11/30/20 0522  WBC 5.8 7.5 6.7  RBC 3.08* 3.05* 2.99*  HGB 9.6* 9.5* 9.5*  HCT 28.4* 27.8* 27.7*  MCV 92.2 91.1 92.6  MCH 31.2 31.1 31.8  MCHC 33.8 34.2 34.3  RDW 12.9 13.1 13.1  PLT 97* 133* 164   Thyroid  Recent Labs  Lab 11/27/20 0851  TSH 3.489    BNP Recent Labs  Lab 11/27/20 0851  BNP 249.2*    DDimer  No results for input(s): DDIMER in the last 168 hours.   Radiology    DG CHEST PORT 1 VIEW  Result Date: 11/29/2020 CLINICAL DATA:  Encounter for removal of tunneled central venous catheter. EXAM: PORTABLE CHEST 1 VIEW COMPARISON:  Chest radiograph 11/27/2020, CT 11/24/2020 FINDINGS: New right upper extremity PICC tip in the lower SVC. Enteric tube in place with tip and side-port below the diaphragm. Hazy opacity throughout the right hemithorax may represent layering pleural effusion, atelectasis, or combination there of. Low lung volumes persist. Stable heart size and mediastinal contours, heart size accentuated by low volume technique. No pneumothorax. Bronchovascular crowding versus vascular congestion. IMPRESSION: 1. Right upper extremity PICC tip in the lower SVC, new from prior exam. 2. Enteric tube in place. 3. Hazy opacity throughout the right hemithorax may represent layering pleural effusion, atelectasis, or combination of. Electronically Signed   By: Keith Rake M.D.   On: 11/29/2020 18:08   DG Abd Portable 1V  Result Date: 11/28/2020 CLINICAL DATA:  Follow-up ileus EXAM: PORTABLE ABDOMEN - 1 VIEW COMPARISON:  11/27/2020 FINDINGS: Stable bowel gas pattern is noted throughout the large and small bowel consistent with the known history of ileus. No free air is seen. Postsurgical changes in the lumbar and thoracic spine are noted. No other focal abnormality is noted. IMPRESSION: Stable ileus pattern when compare with the prior day. Electronically Signed   By: Inez Catalina M.D.   On: 11/28/2020 15:31   [place] EKG SITE RITE  Result Date: 11/29/2020 If [facility] image not attached, placement could not be confirmed due to current cardiac rhythm.   Cardiac Studies     Patient Profile     65 y.o. adult with Staph bacteremia  Assessment & Plan    Atrial flutter converted to NSR.  On IV Amio which hopefully is short term.  CHADs vasc at least 2, but not  currently a candidate for  anticoagulation.   Planning for TEE due to bacteremia.  I explained the rationale to the patient and family at bedside and they  are agreeable.      For questions or updates, please contact [place] Please consult [email or web address] for contact info under        Signed, Larae Grooms, MD  11/30/2020, 12:08 PM

## 2020-11-30 NOTE — Progress Notes (Addendum)
Patient ID: Danny Hampton, adult   DOB: [birth year], 65 y.o.   MRN: [id number]    Progress Note   Subjective   Day # 6  CC; diffuse ileus in setting of sepsis with MSSA bacteremia/wound infection, status post recent spinal surgery  Labs-WBC 6.7/hemoglobin 9.5/hematocrit 27.7  In good spirits today, says he had a good bowel movement yesterday and another good bowel movement today, passing a lot of gas.  Abdomen still feels distended but not painful   Objective   Vital signs in last 24 hours: Temp:  [97.7 F (36.5 C)-98.5 F (36.9 C)] 98.4 F (36.9 C) (09/23 0800) Pulse Rate:  [81-94] 81 (09/23 1100) Resp:  [8-24] 9 (09/23 1100) BP: (106-153)/(57-138) 116/73 (09/23 1000) SpO2:  [94 %-100 %] 96 % (09/23 1100) Arterial Line BP: (100-169)/(43-109) 118/52 (09/23 1000) Weight:  [94.2 kg] 94.2 kg (09/23 0500) Last BM Date: 11/29/20 General:    white male in NAD -NG clamped Heart:  irRegular rate and rhythm; no murmurs Lungs: Respirations even and unlabored, lungs CTA bilaterally Abdomen:  Soft, large, distended though less so than yesterday bowel sounds remain high-pitched and active no focal tenderness Extremities:  Without edema. Neurologic:  Alert and oriented,  grossly normal neurologically. Psych:  Cooperative. Normal mood and affect.  I Lab Results: Recent Labs    11/28/20 0418 11/29/20 0436 11/30/20 0522  WBC 5.8 7.5 6.7  HGB 9.6* 9.5* 9.5*  HCT 28.4* 27.8* 27.7*  PLT 97* 133* 164   BMET Recent Labs    11/28/20 0418 11/29/20 0436  NA 135 131*  K 3.7 3.9  CL 97* 96*  CO2 22 24  GLUCOSE 181* 133*  BUN 16 20  CREATININE 1.08 0.92  CALCIUM 8.4* 8.3*    Studies/Results:   DG Abd 1 View CLINICAL DATA:  Ileus  EXAM: ABDOMEN - 1 VIEW  COMPARISON:  11/28/2020  FINDINGS: Persistent air distended large and small bowel loops throughout the abdomen, similar in appearance to the previous study. No gross free intraperitoneal air on portable supine view.  Enteric tube tip terminates just beyond the level of the GE junction. Extensive spinal fusion hardware.  IMPRESSION: 1. Persistent air distended large and small bowel loops throughout the abdomen, similar in appearance to the previous study and suggestive of ileus. 2. Enteric tube tip terminates just beyond the level of the GE junction.  Electronically Signed   By: Davina Poke D.O.   On: 11/30/2020 15:28      Assessment / Plan:    #73 65 year old white male with diffuse small and large bowel ileus in setting of recent spine surgery, complicated by wound infection and MSSA bacteremia/sepsis  He is making improvement over the last couple of days, having bowel movements and passing flatus and abdomen is softer though still quite distended  #2 new atrial fibrillation/flutter 3.  New diagnosis of adult onset diabetes mellitus 4.  Elevated LFTs likely reactive secondary to sepsis-improving #5 encephalopathy secondary to above improving  Plan; will give another enema today Continue MiraLAX daily via tube Continue IV Reglan Plain abdominal film today Try to minimize narcotics Is not putting much out of the NG tube and it appears to be clamped most of the time, this may be able to be removed soon.     LOS: 6 days   Amy Esterwood  PA-C9/23/2022, 11:28 AM    [facility] Attending   I agree with the Advanced Practitioner's note, impression and recommendations.  Majority the medical decision-making in  the formulation of the assessment and plan were performed by me.  He just had another enema w/ liquid stool Some progress  Gatha Mayer, MD, [facility] 11/30/2020 4:17 PM

## 2020-11-30 NOTE — Progress Notes (Signed)
Medication consultation: Phenytoin PO to IV   The patient Danny Hampton consult for phenytoin PO to IV  Other pertinent lab values include: No components found for: LABALBU Creatinine, Ser  Date Value Ref Range Status  11/29/2020 0.92 0.61 - 1.24 mg/dL Final    A/P Patient presented to ED with constipation.  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 His abdomen is distended. In addition, patient with MSSA bacteremia. Patient is NPO and Pharmacy asked to changed to IV. There are no significant interactions noted. Phenytoin and paroxetine share enzyme metabolism, however this is likely insignificant. Phenytoin level this morning was 4.6, which corrects to 6.6. Will increase total daily dose. Consider rechecking in a week.  Change Dilantin to 100mg  IV at 0600 and 1400 Add Dilantin 200 mg at 2200 Check Phenytoin level in one week F/U and Monitor labs as indicated  Thank you for allowing pharmacy to participate in this patient's care.  Reatha Harps, PharmD PGY1 Pharmacy Resident 11/30/2020 11:28 AM Check [email or web address] for unit specific pharmacy number

## 2020-11-30 NOTE — TOC Progression Note (Signed)
Transition of Care [facility]) - Progression Note    Patient Details  Name: Danny Hampton MRN: [id number] Date of Birth: 02-29-1956  Transition of Care [facility]) CM/SW [place], LCSW Phone Number: 11/30/2020, 5:30 PM  Clinical Narrative:    CSW left a voicemail for [facility] Worker to see if they have any SNF rehab options for patient. Otherwise will have to place patient at SNF through his Medicare Part A only and he will likely not receive therapies.    Expected Discharge Plan: Skilled Nursing Facility Barriers to Discharge: Ship broker, Continued Medical Work up, SNF Pending bed offer  Expected Discharge Plan and Services Expected Discharge Plan: [place] In-house Referral: Clinical Social Work   Post Acute Care Choice: [place] Living arrangements for the past 2 months: Single Family Home                           HH Arranged: IV Antibiotics HH Agency: [facility] Date HH Agency Contacted: 11/25/20 Time [place]: 49 Representative spoke with at [place]: [place] ([place]) Interventions    Readmission Risk Interventions No flowsheet data found.

## 2020-11-30 NOTE — Consult Note (Signed)
WOC Nurse Consult Note: Patient receiving care in [facility] 4N18. Reason for Consult: "wound vac" I was able to speak with NP Glenford Peers over the telephone and clarify the wound vac consult order.  Her note from today states "Will have wound care take a look at his wound vac".  During my conversation she informed me she wanted the [place] nurses to tell them how often the VAC needed to be changed.  I explained that I could not determine from the OP note on 11/27/20 by Dr. Koleen Distance, the dimensions of the wound, if there was any exposed hardware, or what his thinking was on when he would want a surgical member to re-evaluate the wound.  Also, I could not see any indication that the [facility] dressing placed in the OR had been changed since it was placed on 9/20.  She verified for me it had not been.  I explained that at the first surgical dressing change a medical representative must be present in case there is difficulty removing the VAC sponge (such as being wrapped around hardware), or if there are bleeding issues.  She explained she would not be available until next week.  She agreed to have the order she placed today modified to represent changing the dressing on Monday and provided her number for a [place] nurse to reach her for coordination of care. [name], RN, MSN, CWOCN, CNS-BC, pager [phone]

## 2020-11-30 NOTE — Progress Notes (Signed)
[facility] for Infectious Disease  Date of Admission:  11/23/2020           Reason for visit: Follow up on MSSA bacteremia  Current antibiotics: Nafcillin 9/20-present  Previous antibiotics: Cefazolin 9/18-9/20 Zosyn 9/17  ASSESSMENT:    65 y.o. adult admitted with:  MSSA bacteremia secondary to back infection from recent spinal surgery with hardware placement: Taken back to the OR 11/27/2020 for I&D with cultures growing MSSA as well.  Repeat blood cultures drawn 11/25/2020 finalized no growth and PICC line was placed on 9/22. Ileus: Improving with general surgery and gastroenterology following. Severe sepsis: Secondary to #1.  Improving. Atrial flutter with RVR: Cardiology following and he has improved with diltiazem. Elevated LFTs: Hepatitis panel negative.  Likely related to sepsis and has improved. Acute hypoxic respiratory failure: CTA on admission negative for PE or other pulmonary process such as septic pulmonary emboli.  Repeat chest x-ray done yesterday showed right-sided pleural effusion versus atelectasis.  Improving. Diabetes: A1c 6.5. History of right knee arthroplasty: In August 2020. Encephalopathy: Significantly improved and wife reports back towards baseline.  MRI brain was not obtained as patient declined to have this done yesterday.  RECOMMENDATIONS:    Continue nafcillin for now.  Assuming he tolerates okay, planning for 2 weeks of nafcillin prior to prior to transitioning to cefazolin to conclude his 6 weeks of antibiotics IV prior to transitioning to oral antibiotics for prolonged course given the presence of MSSA bacteremia with back infection and hardware in place. Unable to do rifampin based on significant drug-drug interactions. TEE planned for Monday. Lab monitoring, glycemic control, wound care. Monitor for other foci of infection. Will follow.  Dr. Gale Journey available as needed over the weekend, otherwise, Dr. Baxter Flattery or Linus Salmons will be here  Monday.   Principal Problem:   Staphylococcus aureus bacteremia Active Problems:   Acute respiratory failure with hypoxia (HCC)   GERD (gastroesophageal reflux disease)   Myotonia congenita   Hyperlipidemia   Intractable abdominal pain   Abdominal distension   Generalized abdominal pain   Postoperative wound infection   Ileus, postoperative (HCC)   Acute exacerbation of chronic low back pain   Ileus (HCC)   Abnormal transaminases    MEDICATIONS:    Scheduled Meds: . aspirin  81 mg Oral Daily  . bisacodyl  10 mg Rectal Daily  . budesonide (PULMICORT) nebulizer solution  0.25 mg Nebulization BID  . Carbidopa-Levodopa ER  1 tablet Oral QID  . carvedilol  25 mg Oral BID WC  . Chlorhexidine Gluconate Cloth  6 each Topical Daily  . diltiazem  90 mg Oral Q6H  . docusate sodium  100 mg Oral BID  . heparin  5,000 Units Subcutaneous Q8H  . insulin aspart  0-9 Units Subcutaneous Q4H  . lisinopril  40 mg Per Tube Daily  . loratadine  10 mg Oral Daily  . metoCLOPramide (REGLAN) injection  10 mg Intravenous Q12H  . mupirocin ointment  1 application Nasal BID  . pantoprazole (PROTONIX) IV  40 mg Intravenous Q24H  . PARoxetine  40 mg Oral Daily   And  . PARoxetine  20 mg Oral QHS  . phenytoin (DILANTIN) IV  100 mg Intravenous Q8H  . polyethylene glycol  17 g Per Tube Daily  . senna  1 tablet Per Tube BID  . sodium chloride flush  10-40 mL Intracatheter Q12H  . sodium chloride flush  3 mL Intravenous Q12H   Continuous Infusions: . sodium chloride  Stopped (11/28/20 0549)  . diltiazem (CARDIZEM) infusion Stopped (11/29/20 1555)  . nafcillin (NAFCIL) continuous infusion 20.8 mL/hr at 11/30/20 0800   PRN Meds:.acetaminophen **OR** acetaminophen, albuterol, HYDROmorphone (DILAUDID) injection, hydrOXYzine, LORazepam, menthol-cetylpyridinium **OR** phenol, ondansetron **OR** ondansetron (ZOFRAN) IV, oxyCODONE, sodium chloride flush, sodium chloride flush, sodium phosphate  SUBJECTIVE:    24 hour events:  Patient had a PICC line placed yesterday Afebrile, T-max 98.5 Remains on high flow nasal cannula.  3 L/min 11/25/2020 blood cultures finalized no growth TEE scheduled for Monday 9/26   Patient feels better today.  He had a bowel movement again and his belly feels last distended and painful.  He continues to report back pain.  His wife is in the room and thinks that his confusion is much better.  Review of Systems  All other systems reviewed and are negative.    OBJECTIVE:   Blood pressure 124/78, pulse 88, temperature 98.4 F (36.9 C), temperature source Oral, resp. rate 11, height 5\' 4"  (1.626 m), weight 94.2 kg, SpO2 97 %. Body mass index is 35.65 kg/m.  Physical Exam Constitutional:      Appearance: Normal appearance. He is ill-appearing.  HENT:     Head: Normocephalic and atraumatic.     Nose:     Comments: NG tube in place. Eyes:     Extraocular Movements: Extraocular movements intact.     Conjunctiva/sclera: Conjunctivae normal.  Pulmonary:     Effort: Pulmonary effort is normal. No respiratory distress.     Comments: Remains on high flow nasal cannula. Abdominal:     General: There is distension.     Palpations: Abdomen is soft.     Tenderness: There is no abdominal tenderness. There is no guarding or rebound.     Comments: Distention is improved.  Musculoskeletal:        General: Normal range of motion.     Comments: Right knee arthroplasty scar and knee without warmth or erythema.  Neurological:     General: No focal deficit present.     Mental Status: He is alert and oriented to person, place, and time.     Cranial Nerves: No cranial nerve deficit.  Psychiatric:        Mood and Affect: Mood normal.        Behavior: Behavior normal.     Lab Results: Lab Results  Component Value Date   WBC 6.7 11/30/2020   HGB 9.5 (L) 11/30/2020   HCT 27.7 (L) 11/30/2020   MCV 92.6 11/30/2020   PLT 164 11/30/2020    Lab Results  Component Value  Date   NA 131 (L) 11/29/2020   K 3.9 11/29/2020   CO2 24 11/29/2020   GLUCOSE 133 (H) 11/29/2020   BUN 20 11/29/2020   CREATININE 0.92 11/29/2020   CALCIUM 8.3 (L) 11/29/2020   GFRNONAA >60 11/29/2020   GFRAA >60 01/27/2019    Lab Results  Component Value Date   ALT 13 11/29/2020   AST 145 (H) 11/29/2020   ALKPHOS 50 11/29/2020   BILITOT 1.9 (H) 11/29/2020       Component Value Date/Time   CRP 57.5 (H) 11/26/2020 0454       Component Value Date/Time   ESRSEDRATE 73 (H) 11/26/2020 0454     I have reviewed the micro and lab results in Epic.  Imaging: DG CHEST PORT 1 VIEW  Result Date: 11/29/2020 CLINICAL DATA:  Encounter for removal of tunneled central venous catheter. EXAM: PORTABLE CHEST 1 VIEW COMPARISON:  Chest  radiograph 11/27/2020, CT 11/24/2020 FINDINGS: New right upper extremity PICC tip in the lower SVC. Enteric tube in place with tip and side-port below the diaphragm. Hazy opacity throughout the right hemithorax may represent layering pleural effusion, atelectasis, or combination there of. Low lung volumes persist. Stable heart size and mediastinal contours, heart size accentuated by low volume technique. No pneumothorax. Bronchovascular crowding versus vascular congestion. IMPRESSION: 1. Right upper extremity PICC tip in the lower SVC, new from prior exam. 2. Enteric tube in place. 3. Hazy opacity throughout the right hemithorax may represent layering pleural effusion, atelectasis, or combination of. Electronically Signed   By: Keith Rake M.D.   On: 11/29/2020 18:08   DG Abd Portable 1V  Result Date: 11/28/2020 CLINICAL DATA:  Follow-up ileus EXAM: PORTABLE ABDOMEN - 1 VIEW COMPARISON:  11/27/2020 FINDINGS: Stable bowel gas pattern is noted throughout the large and small bowel consistent with the known history of ileus. No free air is seen. Postsurgical changes in the lumbar and thoracic spine are noted. No other focal abnormality is noted. IMPRESSION: Stable  ileus pattern when compare with the prior day. Electronically Signed   By: Inez Catalina M.D.   On: 11/28/2020 15:31   [place] EKG SITE RITE  Result Date: 11/29/2020 If [facility] image not attached, placement could not be confirmed due to current cardiac rhythm.    Imaging independently reviewed in Epic.    Raynelle [facility] for Infectious Disease [facility] [phone] pager 11/30/2020, 8:26 AM  I spent greater than 35 minutes with the patient including greater than 50% of time in face to face counsel of the patient and in coordination of their care.

## 2020-11-30 NOTE — Consult Note (Addendum)
Brief Psychiatry Consult Note  We were consulted on this patient today; briefly he has a history of depression (on paroxetine 60 mg) and [employer]. He made some parasuicidal statements to his significant other and to the primary team today; later clarified to multiple individuals per chart review that he is having difficulty processing his declining health status, has no plan to harm himself while in the hospital, and is more having passive suicidal than active suicidal thoughts. He is at high background risk of suicide due to age, demographic factors, and former Armed forces logistics/support/administrative officer and requires formal evaluation. Unfortunately when I went to see him he was being worked with by therapy which is essential to long-term treatment of his mental health issues (seem largely rooted in poor physical health). I briefly confirmed above (pt contracted for safety in the hospital setting) and stated I would be back to see him tomorrow in the afternoon.   - psychiatry to formally evaluate tomorrow.  [place] no charge

## 2020-11-30 NOTE — Progress Notes (Signed)
Subjective: Patient reports moderate back pain   Objective: Vital signs in last 24 hours: Temp:  [97.7 F (36.5 C)-98.5 F (36.9 C)] 98.4 F (36.9 C) (09/23 0800) Pulse Rate:  [81-94] 81 (09/23 1100) Resp:  [8-24] 9 (09/23 1100) BP: (106-153)/(57-138) 116/73 (09/23 1000) SpO2:  [94 %-100 %] 96 % (09/23 1100) Arterial Line BP: (100-169)/(43-109) 118/52 (09/23 1000) Weight:  [94.2 kg] 94.2 kg (09/23 0500)  Intake/Output from previous day: 09/22 0701 - 09/23 0700 In: 1633.4 [I.V.:135.5; IV Piggyback:1498] Out: 1700 [Urine:1350; Emesis/NG output:200; Drains:150] Intake/Output this shift: Total I/O In: 82.6 [IV Piggyback:82.6] Out: -   Neurologic: Grossly normal  Lab Results: Lab Results  Component Value Date   WBC 6.7 11/30/2020   HGB 9.5 (L) 11/30/2020   HCT 27.7 (L) 11/30/2020   MCV 92.6 11/30/2020   PLT 164 11/30/2020   Lab Results  Component Value Date   INR 0.9 10/07/2018   BMET Lab Results  Component Value Date   NA 131 (L) 11/29/2020   K 3.9 11/29/2020   CL 96 (L) 11/29/2020   CO2 24 11/29/2020   GLUCOSE 133 (H) 11/29/2020   BUN 20 11/29/2020   CREATININE 0.92 11/29/2020   CALCIUM 8.3 (L) 11/29/2020    Studies/Results: DG CHEST PORT 1 VIEW  Result Date: 11/29/2020 CLINICAL DATA:  Encounter for removal of tunneled central venous catheter. EXAM: PORTABLE CHEST 1 VIEW COMPARISON:  Chest radiograph 11/27/2020, CT 11/24/2020 FINDINGS: New right upper extremity PICC tip in the lower SVC. Enteric tube in place with tip and side-port below the diaphragm. Hazy opacity throughout the right hemithorax may represent layering pleural effusion, atelectasis, or combination there of. Low lung volumes persist. Stable heart size and mediastinal contours, heart size accentuated by low volume technique. No pneumothorax. Bronchovascular crowding versus vascular congestion. IMPRESSION: 1. Right upper extremity PICC tip in the lower SVC, new from prior exam. 2. Enteric tube in  place. 3. Hazy opacity throughout the right hemithorax may represent layering pleural effusion, atelectasis, or combination of. Electronically Signed   By: Keith Rake M.D.   On: 11/29/2020 18:08   DG Abd Portable 1V  Result Date: 11/28/2020 CLINICAL DATA:  Follow-up ileus EXAM: PORTABLE ABDOMEN - 1 VIEW COMPARISON:  11/27/2020 FINDINGS: Stable bowel gas pattern is noted throughout the large and small bowel consistent with the known history of ileus. No free air is seen. Postsurgical changes in the lumbar and thoracic spine are noted. No other focal abnormality is noted. IMPRESSION: Stable ileus pattern when compare with the prior day. Electronically Signed   By: Inez Catalina M.D.   On: 11/28/2020 15:31   [place] EKG SITE RITE  Result Date: 11/29/2020 If [facility] image not attached, placement could not be confirmed due to current cardiac rhythm.   Assessment/Plan: S/p wound washout. Will have wound care take a look at his wound vac.   LOS: 6 days    Ocie Cornfield [facility] 11/30/2020, 11:39 AM

## 2020-11-30 NOTE — Progress Notes (Signed)
Physical Therapy Treatment Patient Details Name: Danny Hampton MRN: [id number] DOB: 1955-06-16 Today's Date: 11/30/2020   History of Present Illness 65 y/o male presented to [facility] ED on 9/16 with complaints of abdominal distention and constipation. CT showed mildly dilated small bowel and some colonic distension with air-fluid level concerning for ileus. Transferred to [facility] for further evaluation by neurosurgery due to his initial lumbar decompression on 10/31/20 due to purulent drainage from thoracic incision and blood cultures positive for MSSA. Developed new onset Afib RVR. Patient s/p debridement of postoperative lumbar wound infection on 9/20. PMH: GERD, HTN, myotonia congenita, DM, dysrhythmia, HLD, acid reflux, anxiety.    PT Comments    Patient wanting to get OOB to have BM. Patient able to perform stand pivot with RW and minA+2 to [facility]. Patient with complaints of dizziness upon sitting. BP drop to 88/60 then increased to 90/70s. Encouraged patient to get OOB for use of BSC with nursing staff. Continue to recommend SNF for ongoing Physical Therapy.       Recommendations for follow up therapy are one component of a multi-disciplinary discharge planning process, led by the attending physician.  Recommendations may be updated based on patient status, additional functional criteria and insurance authorization.  Follow Up Recommendations  SNF     Equipment Recommendations  Other (comment) (TBD)    Recommendations for Other Services       Precautions / Restrictions Precautions Precautions: Fall;Back Precaution Booklet Issued: No Precaution Comments: wound vac, no brace needed per order, NG tube Restrictions Weight Bearing Restrictions: No     Mobility  Bed Mobility Overal bed mobility: Needs Assistance Bed Mobility: Rolling;Sidelying to Sit;Sit to Sidelying Rolling: Min guard Sidelying to sit: Min assist;+2 for safety/equipment     Sit to sidelying: Mod assist;+2 for  physical assistance General bed mobility comments: minA+2 for rolling towards R side and modA+2 for trunk elevation and bringing LEs off bed    Transfers Overall transfer level: Needs assistance Equipment used: Rolling Evanie Buckle (2 wheeled) Transfers: Sit to/from [facility] Sit to Stand: Min assist;+2 safety/equipment Stand pivot transfers: Min assist;+2 safety/equipment          Ambulation/Gait                 Stairs             Wheelchair Mobility    Modified Rankin (Stroke Patients Only)       Balance Overall balance assessment: Needs assistance Sitting-balance support: Bilateral upper extremity supported;Feet supported Sitting balance-Leahy Scale: Good     Standing balance support: Bilateral upper extremity supported;During functional activity Standing balance-Leahy Scale: Poor Standing balance comment: reliant on UE support and external assist                            Cognition Arousal/Alertness: Awake/alert Behavior During Therapy: WFL for tasks assessed/performed Overall Cognitive Status: No family/caregiver present to determine baseline cognitive functioning                                 General Comments: tangential at times; appears confused at times      Exercises      General Comments        Pertinent Vitals/Pain Pain Assessment: Faces Faces Pain Scale: Hurts little more Pain Location: back Pain Descriptors / Indicators: Grimacing;Guarding;Sore Pain Intervention(s): Limited activity within patient's tolerance  Home Living                      Prior Function            PT Goals (current goals can now be found in the care plan section) Acute Rehab PT Goals Patient Stated Goal: to get better PT Goal Formulation: With patient Time For Goal Achievement: 12/12/20 Potential to Achieve Goals: Good Progress towards PT goals: Progressing toward goals    Frequency    Min  3X/week      PT Plan Current plan remains appropriate    Co-evaluation PT/OT/SLP Co-Evaluation/Treatment: Yes Reason for Co-Treatment: Complexity of the patient's impairments (multi-system involvement);For patient/therapist safety;To address functional/ADL transfers PT goals addressed during session: Mobility/safety with mobility OT goals addressed during session: ADL's and self-care      AM-PAC PT "6 Clicks" Mobility   Outcome Measure  Help needed turning from your back to your side while in a flat bed without using bedrails?: Total Help needed moving from lying on your back to sitting on the side of a flat bed without using bedrails?: Total Help needed moving to and from a bed to a chair (including a wheelchair)?: Total Help needed standing up from a chair using your arms (e.g., wheelchair or bedside chair)?: Total Help needed to walk in hospital room?: Total Help needed climbing 3-5 steps with a railing? : Total 6 Click Score: 6    End of Session Equipment Utilized During Treatment: Gait belt Activity Tolerance: Patient tolerated treatment well Patient left: in bed;with call bell/phone within reach Nurse Communication: Mobility status PT Visit Diagnosis: Unsteadiness on feet (R26.81);Other abnormalities of gait and mobility (R26.89);Muscle weakness (generalized) (M62.81)     Time: 2947-6546 PT Time Calculation (min) (ACUTE ONLY): 31 min  Charges:  $Therapeutic Activity: 8-22 mins                     Kel Senn A. Gilford Rile PT, DPT Acute Rehabilitation Services Pager [phone] Office [phone]    Linna Hoff 11/30/2020, 5:24 PM

## 2020-11-30 NOTE — Progress Notes (Signed)
NAME:  Danny Hampton, MRN:  [id number], DOB:  1955-10-29, LOS: 6 ADMISSION DATE:  11/23/2020, CONSULTATION DATE: 11/27/2020 REFERRING MD: [facility], CHIEF COMPLAINT: Back and abdominal pain  History of Present Illness:  65 year old with a extensive past medical history is well-documented below.  He had spinal surgery August 24 of this year and returns with chief complaints of abdominal distention significant for ileus.  He was transferred from [facility] to [facility] for further evaluation by neurosurgery Dr. Saintclair Halsted due to his initial surgery.  He is positive for MSSA bacteremia for which he is on nafcillin.  If further develop atrial fibrillation with rapid ventricular response to 170s and is proven refractory to diltiazem drip.  He is noted to have a mottled lower extremities appears to be decompressing and will be transferred to intensive care unit for further evaluation and treatment  Significant Hospital Events: Including procedures, antibiotic start and stop dates in addition to other pertinent events   11/27/2020 transfer intensive care unit  Interim History / Subjective:  Patient remained in sinus rhythm, had 1 more bowel movement this morning Stated feeling depressed and wanted to hurt himself, requesting speaking with psychiatrist  Objective   Blood pressure 116/73, pulse 81, temperature 98.4 F (36.9 C), temperature source Oral, resp. rate (!) 9, height 5\' 4"  (1.626 m), weight 94.2 kg, SpO2 96 %.        Intake/Output Summary (Last 24 hours) at 11/30/2020 1103 Last data filed at 11/30/2020 1100 Gross per 24 hour  Intake 1554.32 ml  Output 1700 ml  Net -145.68 ml   Filed Weights   11/28/20 0500 11/29/20 0447 11/30/20 0500  Weight: 94.2 kg 93.9 kg 94.2 kg    Examination:   Physical exam: General: Crtitically ill-appearing morbidly obese male, lying in the bed on nasal cannula oxygen HEENT: [place]/AT, eyes anicteric. OGT in place Neuro: Alert, awake, following  commands, moving all 4 extremities Chest: Coarse breath sounds, no wheezes or rhonchi Heart: Regular rate and rhythm, no murmurs or gallops Abdomen: Soft, distended, nontender, hyperactive bowel sounds present Skin: No rash  Resolved Hospital Problem list     Assessment & Plan:  Sepsis due to surgical wound infection with MSSA s/p I&D and washout MSSA bacteremia  Continue nafcillin per discussion with infectious disease Repeat blood cultures are negative Surgical wound culture also growing MSSA TTE was negative for vegetations Patient is a scheduled for TEE on Monday by cardiology  New diagnosis of atrial fibrillation rapid ventricular response related to sepsis, converted back to normal sinus rhythm Hypertension, uncontrolled Patient converted to sinus rhythm Off diltiazem infusion Continue oral diltiazem 90 mg every 6 hours and Coreg 25 mg twice daily Patient has a high CHA2DS2-VASc score, unable to anticoagulate due to recent procedure, consider restarting anticoagulation prior to discharge  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 General surgery and GI is following, appreciate their input Patient is passing gas and had 2 bowel movements in the last 2 days We will start him on oral MiraLAX per GI recommendations Keeping serum potassium close to 4 and magnesium close to 2  New diagnosis of diabetes with hyperglycemia Blood sugars are better controlled Continue sliding scale insulin and Levemir  Hyponatremia Closely monitor  Acute kidney injury due to severe dehydration Serum creatinine has improved after IV fluid therapy, closely monitor Monitor intake and output Avoid nephrotoxic agents  Acute hepatitis due to severe sepsis Initially LFTs trended up, now downtrending with sepsis under control Monitor LFTs  Acute  depression with suicidal ideation Psychiatric consult is requested  Morbid obesity Dietitian follow-up   Diet/type: NPO, except meds DVT  prophylaxis: prophylactic heparin  GI prophylaxis: N/A Lines: N/A Foley:  N/A Code Status:  full code Last date of multidisciplinary goals of care discussion [9/23: Spoke with patient and his wife at bedside, he would like to continue full aggressive care]  Labs   CBC: Recent Labs  Lab 11/26/20 0454 11/27/20 0348 11/28/20 0418 11/29/20 0436 11/30/20 0522  WBC 6.4 5.8 5.8 7.5 6.7  NEUTROABS 5.3 4.6 4.3 5.2 4.5  HGB 9.8* 10.0* 9.6* 9.5* 9.5*  HCT 29.5* 28.7* 28.4* 27.8* 27.7*  MCV 95.5 92.0 92.2 91.1 92.6  PLT 83* 77* 97* 133* 124    Basic Metabolic Panel: Recent Labs  Lab 11/24/20 0546 11/25/20 0306 11/26/20 0454 11/27/20 0348 11/27/20 0851 11/28/20 0418 11/29/20 0436  NA 130* 124* 124* 130*  --  135 131*  K 3.7 3.2* 4.5 4.4  --  3.7 3.9  CL 89* 90* 91* 95*  --  97* 96*  CO2 32 25 22 21*  --  22 24  GLUCOSE 175* 154* 138* 128*  --  181* 133*  BUN 16 14 25* 23  --  16 20  CREATININE 0.78 0.79 1.36* 1.05  --  1.08 0.92  CALCIUM 8.6* 7.4* 7.6* 8.6*  --  8.4* 8.3*  MG 1.5* 1.9  --   --  1.9 1.9  --   PHOS  --   --   --   --  2.4* 2.5  --    GFR: Estimated Creatinine Clearance (by C-G formula based on SCr of 0.92 mg/dL) Male: 67.8 mL/min Male: 82.9 mL/min Recent Labs  Lab 11/24/20 0546 11/24/20 0816 11/24/20 1157 11/24/20 1535 11/25/20 0306 11/26/20 0454 11/27/20 0348 11/27/20 0851 11/27/20 1013 11/28/20 0418 11/29/20 0436 11/30/20 0522  PROCALCITON 1.14  --   --   --  2.44 3.95  --   --   --   --   --   --   WBC 11.9*  --   --   --  6.8 6.4 5.8  --   --  5.8 7.5 6.7  LATICACIDVEN 2.5*   < > 2.6* 1.7  --   --   --  1.8 1.9  --   --   --    < > = values in this interval not displayed.    Liver Function Tests: Recent Labs  Lab 11/25/20 0306 11/26/20 0454 11/27/20 0348 11/28/20 0418 11/29/20 0436  AST 41 330* 393* 296* 145*  ALT 9 13 77* 103* 13  ALKPHOS 60 55 50 53 50  BILITOT 0.6 0.8 1.0 1.7* 1.9*  PROT 5.6* 6.4* 5.9* 5.8* 5.3*  ALBUMIN  2.7* 2.8* 2.5* 2.3* 2.1*   Recent Labs  Lab 11/27/20 0851  LIPASE 48   No results for input(s): AMMONIA in the last 168 hours.  ABG No results found for: PHART, PCO2ART, PO2ART, HCO3, TCO2, ACIDBASEDEF, O2SAT   Coagulation Profile: No results for input(s): INR, PROTIME in the last 168 hours.  Cardiac Enzymes: No results for input(s): CKTOTAL, CKMB, CKMBINDEX, TROPONINI in the last 168 hours.  HbA1C: Hgb A1c MFr Bld  Date/Time Value Ref Range Status  10/22/2020 10:48 AM 6.5 (H) 4.8 - 5.6 % Final    Comment:    (NOTE) Pre diabetes:          5.7%-6.4%  Diabetes:              >  6.4%  Glycemic control for   <7.0% adults with diabetes   04/22/2020 06:05 AM 6.7 (H) 4.8 - 5.6 % Final    Comment:    (NOTE) Pre diabetes:          5.7%-6.4%  Diabetes:              >6.4%  Glycemic control for   <7.0% adults with diabetes     CBG: Recent Labs  Lab 11/29/20 1629 11/29/20 1919 11/29/20 2312 11/30/20 0315 11/30/20 0751  GLUCAP 128* 117* 120* 117* 130*      Jacky Kindle MD [facility] See Amion for pager If no response to pager, please call [phone] until 7pm After 7pm, Please call E-link [phone]

## 2020-11-30 NOTE — Progress Notes (Signed)
Occupational Therapy Treatment Patient Details Name: Danny Hampton MRN: [id number] DOB: Jul 13, 1955 Today's Date: 11/30/2020   History of present illness 65 y/o male presented to [facility] ED on 9/16 with complaints of abdominal distention and constipation. CT showed mildly dilated small bowel and some colonic distension with air-fluid level concerning for ileus. Transferred to [facility] for further evaluation by neurosurgery due to his initial lumbar decompression on 10/31/20 due to purulent drainage from thoracic incision and blood cultures positive for MSSA. Developed new onset Afib RVR. Patient s/p debridement of postoperative lumbar wound infection on 9/20. PMH: GERD, HTN, myotonia congenita, DM, dysrhythmia, HLD, acid reflux, anxiety.   OT comments  Pt mobilized OOB to [facility] with min A +2 for safety. Max A for hygiene after toileting. BP drop to 88/60 sitting on BSC , then increased to @ 90/70s. Recommend staff increase mobility OOB to [facility] an increase time in chair as tolerated. If pt complains of increased back pain in chair, use chair position feature of bed to increase time upright as pt has spent increased time supine. Recommend rehab at SNF. Will continue to follow acutely.  Seen on 3L   Recommendations for follow up therapy are one component of a multi-disciplinary discharge planning process, led by the attending physician.  Recommendations may be updated based on patient status, additional functional criteria and insurance authorization.    Follow Up Recommendations  SNF    Equipment Recommendations  3 in 1 bedside commode    Recommendations for Other Services      Precautions / Restrictions Precautions Precautions: Fall;Back Precaution Comments: wound vac, no brace needed per order, NG tube, A-line, multiple IV lines       Mobility Bed Mobility Overal bed mobility: Needs Assistance Bed Mobility: Rolling;Sidelying to Sit;Sit to Sidelying Rolling: Min guard Sidelying to sit: Min  assist;+2 for safety/equipment     Sit to sidelying: Mod assist      Transfers Overall transfer level: Needs assistance Equipment used: Rolling walker (2 wheeled) Transfers: Sit to/from [facility] Sit to Stand: Min assist;+2 safety/equipment Stand pivot transfers: Min assist;+2 safety/equipment            Balance     Sitting balance-Leahy Scale: Good       Standing balance-Leahy Scale: Fair                             ADL either performed or assessed with clinical judgement   ADL Overall ADL's : Needs assistance/impaired     Grooming: Set up;Sitting   Upper Body Bathing: Minimal assistance;Sitting   Lower Body Bathing: Moderate assistance;Sit to/from stand           Toilet Transfer: Minimal assistance;+2 for safety/equipment   Toileting- Clothing Manipulation and Hygiene: Maximal assistance       Functional mobility during ADLs: Minimal assistance;Cueing for safety;Rolling walker       Vision       Perception     Praxis      Cognition Arousal/Alertness: Awake/alert Behavior During Therapy: WFL for tasks assessed/performed Overall Cognitive Status: No family/caregiver present to determine baseline cognitive functioning                                 General Comments: tangential at times; appears confused at times        Exercises     Shoulder Instructions  General Comments      Pertinent Vitals/ Pain       Pain Assessment: Faces Faces Pain Scale: Hurts little more Pain Location: back Pain Descriptors / Indicators: Grimacing;Guarding;Sore Pain Intervention(s): Limited activity within patient's tolerance  Home Living                                          Prior Functioning/Environment              Frequency  Min 2X/week        Progress Toward Goals  OT Goals(current goals can now be found in the care plan section)  Progress towards OT goals:  Progressing toward goals  Acute Rehab OT Goals Patient Stated Goal: to get better OT Goal Formulation: With patient Time For Goal Achievement: 12/12/20 Potential to Achieve Goals: Good ADL Goals Pt Will Perform Grooming: with min assist;sitting Pt Will Perform Upper Body Bathing: with min assist;sitting Pt Will Perform Lower Body Bathing: with modified independence;sit to/from stand Pt Will Perform Lower Body Dressing: with modified independence;sit to/from stand Pt Will Transfer to Toilet: with max assist;stand pivot transfer;bedside commode Additional ADL Goal #1: pt will complete bed mobility min (A) as precursor to adls.  Plan Discharge plan needs to be updated    Co-evaluation    PT/OT/SLP Co-Evaluation/Treatment: Yes Reason for Co-Treatment: Complexity of the patient's impairments (multi-system involvement);For patient/therapist safety;To address functional/ADL transfers   OT goals addressed during session: ADL's and self-care      AM-PAC OT "6 Clicks" Daily Activity     Outcome Measure   Help from another person eating meals?: A Little Help from another person taking care of personal grooming?: A Little Help from another person toileting, which includes using toliet, bedpan, or urinal?: A Lot Help from another person bathing (including washing, rinsing, drying)?: A Lot Help from another person to put on and taking off regular upper body clothing?: A Little Help from another person to put on and taking off regular lower body clothing?: A Lot 6 Click Score: 15    End of Session Equipment Utilized During Treatment: Gait belt  OT Visit Diagnosis: Unsteadiness on feet (R26.81);Other abnormalities of gait and mobility (R26.89);Muscle weakness (generalized) (M62.81);Other symptoms and signs involving cognitive function;Pain Pain - part of body:  (back)   Activity Tolerance Patient tolerated treatment well   Patient Left in bed;with call bell/phone within reach;with bed  alarm set   Nurse Communication Mobility status        Time: 4503-8882 OT Time Calculation (min): 31 min  Charges: OT General Charges $OT Visit: 1 Visit OT Treatments $Self Care/Home Management : 8-22 mins  Maurie Boettcher, OT/L   Acute OT Clinical Specialist [place] Pager [phone] Office [phone]   [facility] 11/30/2020, 5:10 PM

## 2020-11-30 NOTE — Progress Notes (Signed)
Responded to page to support patient. Pt. Expressed concerns about doctor care and listening to him. Pt also expressed that he is ready to be discharged. Michela Pitcher he has been in hospital about 10 days.  I advised him to talk with his doctor about when he may be discharged.  Chaplain provided listening, prayer and emotional support and presence.l Chaplain available as needed.  Jaclynn Major, [place], [facility], Pager [phone]

## 2020-11-30 NOTE — Progress Notes (Signed)
Patient ID: Danny Hampton, adult   DOB: 17-Mar-1955, 65 y.o.   MRN: [id number]  [facility] Surgery Progress Note  3 Days Post-Op  Subjective: CC-  Feeling a little better today. Less abdominal pain and distension. Passing flatus and had a BM yesterday and one today.  Objective: Vital signs in last 24 hours: Temp:  [97.6 F (36.4 C)-98.5 F (36.9 C)] 98.4 F (36.9 C) (09/23 0400) Pulse Rate:  [79-94] 88 (09/23 0800) Resp:  [8-24] 11 (09/23 0800) BP: (106-154)/(57-138) 124/78 (09/23 0800) SpO2:  [91 %-100 %] 97 % (09/23 0800) Arterial Line BP: (100-179)/(43-109) 122/55 (09/23 0800) Weight:  [94.2 kg] 94.2 kg (09/23 0500) Last BM Date: 11/29/20  Intake/Output from previous day: 09/22 0701 - 09/23 0700 In: 1633.4 [I.V.:135.5; IV Piggyback:1498] Out: 1700 [Urine:1350; Emesis/NG output:200; Drains:150] Intake/Output this shift: Total I/O In: 20.8 [IV Piggyback:20.8] Out: -   PE: Gen:  Alert, NAD, pleasant Pulm:  rate and effort normal Abd: distended but soft (improved from yesterday), nontender, tinkling BS, no masses, hernias, or organomegaly  Lab Results:  Recent Labs    11/29/20 0436 11/30/20 0522  WBC 7.5 6.7  HGB 9.5* 9.5*  HCT 27.8* 27.7*  PLT 133* 164   BMET Recent Labs    11/28/20 0418 11/29/20 0436  NA 135 131*  K 3.7 3.9  CL 97* 96*  CO2 22 24  GLUCOSE 181* 133*  BUN 16 20  CREATININE 1.08 0.92  CALCIUM 8.4* 8.3*   PT/INR No results for input(s): LABPROT, INR in the last 72 hours. CMP     Component Value Date/Time   NA 131 (L) 11/29/2020 0436   K 3.9 11/29/2020 0436   CL 96 (L) 11/29/2020 0436   CO2 24 11/29/2020 0436   GLUCOSE 133 (H) 11/29/2020 0436   BUN 20 11/29/2020 0436   CREATININE 0.92 11/29/2020 0436   CALCIUM 8.3 (L) 11/29/2020 0436   PROT 5.3 (L) 11/29/2020 0436   ALBUMIN 2.1 (L) 11/29/2020 0436   AST 145 (H) 11/29/2020 0436   ALT 13 11/29/2020 0436   ALKPHOS 50 11/29/2020 0436   BILITOT 1.9 (H) 11/29/2020 0436    GFRNONAA >60 11/29/2020 0436   GFRAA >60 01/27/2019 1014   Lipase     Component Value Date/Time   LIPASE 48 11/27/2020 0851       Studies/Results: DG CHEST PORT 1 VIEW  Result Date: 11/29/2020 CLINICAL DATA:  Encounter for removal of tunneled central venous catheter. EXAM: PORTABLE CHEST 1 VIEW COMPARISON:  Chest radiograph 11/27/2020, CT 11/24/2020 FINDINGS: New right upper extremity PICC tip in the lower SVC. Enteric tube in place with tip and side-port below the diaphragm. Hazy opacity throughout the right hemithorax may represent layering pleural effusion, atelectasis, or combination there of. Low lung volumes persist. Stable heart size and mediastinal contours, heart size accentuated by low volume technique. No pneumothorax. Bronchovascular crowding versus vascular congestion. IMPRESSION: 1. Right upper extremity PICC tip in the lower SVC, new from prior exam. 2. Enteric tube in place. 3. Hazy opacity throughout the right hemithorax may represent layering pleural effusion, atelectasis, or combination of. Electronically Signed   By: Keith Rake M.D.   On: 11/29/2020 18:08   DG Abd Portable 1V  Result Date: 11/28/2020 CLINICAL DATA:  Follow-up ileus EXAM: PORTABLE ABDOMEN - 1 VIEW COMPARISON:  11/27/2020 FINDINGS: Stable bowel gas pattern is noted throughout the large and small bowel consistent with the known history of ileus. No free air is seen. Postsurgical changes in the  lumbar and thoracic spine are noted. No other focal abnormality is noted. IMPRESSION: Stable ileus pattern when compare with the prior day. Electronically Signed   By: Inez Catalina M.D.   On: 11/28/2020 15:31   [place] EKG SITE RITE  Result Date: 11/29/2020 If [facility] image not attached, placement could not be confirmed due to current cardiac rhythm.   Anti-infectives: Anti-infectives (From admission, onward)    Start     Dose/Rate Hampton Frequency Ordered Stop   11/27/20 2145  ceFAZolin (ANCEF) IVPB 2g/100 mL  premix        2 g 200 mL/hr over 30 Minutes Intravenous Every 8 hours 11/27/20 2052 11/28/20 0619   11/27/20 1200  nafcillin 12 g in sodium chloride 0.9 % 500 mL continuous infusion        12 g 20.8 mL/hr over 24 Hours Intravenous Every 24 hours 11/27/20 1007     11/25/20 1400  ceFAZolin (ANCEF) IVPB 2g/100 mL premix  Status:  Discontinued        2 g 200 mL/hr over 30 Minutes Intravenous Every 8 hours 11/25/20 0915 11/27/20 1007   11/24/20 1400  piperacillin-tazobactam (ZOSYN) IVPB 3.375 g  Status:  Discontinued       See Hyperspace for full Linked Orders Report.   3.375 g 12.5 mL/hr over 240 Minutes Intravenous Every 8 hours 11/24/20 0755 11/25/20 0915   11/24/20 0800  piperacillin-tazobactam (ZOSYN) IVPB 3.375 g       See Hyperspace for full Linked Orders Report.   3.375 g 100 mL/hr over 30 Minutes Intravenous  Once 11/24/20 0755 11/24/20 0925        Assessment/Plan Ileus with small bowel and colonic distension - xray 9/21 with stable ileus pattern.  - Clinically improving. Abdomen is softer and he is having some bowel function. No role for surgical intervention. continue management per GI. We will sign off, please call with questions or concerns.    ID - zosyn 9/17>>9/18, ancef 9/18>>9/20, nafcillin 9/20>> VTE - sq heparin FEN - NPO/NGT to LIWS Foley - none   S/p back surgery 10/31/20 by Dr. Saintclair Hampton  S/p Debridement of postoperative lumbar wound infection 11/27/20 Dr. Ellene Hampton MSSA bacteremia A fib RVR GERD HTN Hypontonia congenita DM   LOS: 6 days    Danny Hampton, [facility] Surgery 11/30/2020, 8:33 AM Please see Amion for pager number during day hours 7:00am-4:30pm

## 2020-12-01 DIAGNOSIS — R453 Demoralization and apathy: Secondary | ICD-10-CM | POA: Diagnosis not present

## 2020-12-01 DIAGNOSIS — R7881 Bacteremia: Secondary | ICD-10-CM | POA: Diagnosis not present

## 2020-12-01 DIAGNOSIS — B9561 Methicillin susceptible Staphylococcus aureus infection as the cause of diseases classified elsewhere: Secondary | ICD-10-CM | POA: Diagnosis not present

## 2020-12-01 HISTORY — DX: Demoralization and apathy: R45.3

## 2020-12-01 LAB — COMPREHENSIVE METABOLIC PANEL
ALT: 13 U/L (ref 0–44)
AST: 25 U/L (ref 15–41)
Albumin: 1.7 g/dL — ABNORMAL LOW (ref 3.5–5.0)
Alkaline Phosphatase: 39 U/L (ref 38–126)
Anion gap: 10 (ref 5–15)
BUN: 11 mg/dL (ref 8–23)
CO2: 26 mmol/L (ref 22–32)
Calcium: 7.9 mg/dL — ABNORMAL LOW (ref 8.9–10.3)
Chloride: 98 mmol/L (ref 98–111)
Creatinine, Ser: 0.93 mg/dL (ref 0.61–1.24)
GFR, Estimated: >60 mL/min (ref >60)
Glucose, Bld: 120 mg/dL — ABNORMAL HIGH (ref 70–99)
Potassium: 3.4 mmol/L — ABNORMAL LOW (ref 3.5–5.1)
Sodium: 134 mmol/L — ABNORMAL LOW (ref 135–145)
Total Bilirubin: 1.5 mg/dL — ABNORMAL HIGH (ref 0.3–1.2)
Total Protein: 4.8 g/dL — ABNORMAL LOW (ref 6.5–8.1)

## 2020-12-01 LAB — CBC WITH DIFFERENTIAL/PLATELET
Abs Immature Granulocytes: 0.36 10*3/uL — ABNORMAL HIGH (ref 0.00–0.07)
Basophils Absolute: 0 10*3/uL (ref 0.0–0.1)
Basophils Relative: 1 %
Eosinophils Absolute: 0.2 10*3/uL (ref 0.0–0.5)
Eosinophils Relative: 3 %
HCT: 26 % — ABNORMAL LOW (ref 39.0–52.0)
Hemoglobin: 8.6 g/dL — ABNORMAL LOW (ref 13.0–17.0)
Immature Granulocytes: 5 %
Lymphocytes Relative: 13 %
Lymphs Abs: 0.9 10*3/uL (ref 0.7–4.0)
MCH: 30.9 pg (ref 26.0–34.0)
MCHC: 33.1 g/dL (ref 30.0–36.0)
MCV: 93.5 fL (ref 80.0–100.0)
Monocytes Absolute: 0.8 10*3/uL (ref 0.1–1.0)
Monocytes Relative: 11 %
Neutro Abs: 4.8 10*3/uL (ref 1.7–7.7)
Neutrophils Relative %: 67 %
Platelets: 197 10*3/uL (ref 150–400)
RBC: 2.78 MIL/uL — ABNORMAL LOW (ref 4.22–5.81)
RDW: 13.2 % (ref 11.5–15.5)
WBC: 7.2 10*3/uL (ref 4.0–10.5)
nRBC: 0 % (ref 0.0–0.2)

## 2020-12-01 LAB — GLUCOSE, CAPILLARY
Glucose-Capillary: 117 mg/dL — ABNORMAL HIGH (ref 70–99)
Glucose-Capillary: 117 mg/dL — ABNORMAL HIGH (ref 70–99)
Glucose-Capillary: 115 mg/dL — ABNORMAL HIGH (ref 70–99)
Glucose-Capillary: 100 mg/dL — ABNORMAL HIGH (ref 70–99)
Glucose-Capillary: 208 mg/dL — ABNORMAL HIGH (ref 70–99)
Glucose-Capillary: 114 mg/dL — ABNORMAL HIGH (ref 70–99)

## 2020-12-01 MED ORDER — PAROXETINE HCL 20 MG PO TABS
20.0000 mg | ORAL_TABLET | Freq: Every day | ORAL | Status: DC
  Administered 2020-12-01: 20 mg via ORAL
  Filled 2020-12-01: qty 1

## 2020-12-01 MED ORDER — PAROXETINE HCL 20 MG PO TABS
20.0000 mg | ORAL_TABLET | Freq: Every day | ORAL | Status: DC
  Filled 2020-12-01: qty 1

## 2020-12-01 NOTE — Progress Notes (Signed)
Patient ID: Danny Hampton, adult   DOB: May 28, 1955, 65 y.o.   MRN: [id number] BP 96/61   Pulse 72   Temp 97.7 F (36.5 C) (Oral)   Resp 13   Ht 5\' 4"  (1.626 m)   Wt 96.8 kg   SpO2 97%   BMI 36.63 kg/m  Alert and oriented x 4, speech is clear and fluent Moving all extremities well Wound vac in place Continue abx

## 2020-12-01 NOTE — Progress Notes (Signed)
Progress Note  Patient Name: Danny Hampton Date of Encounter: 12/01/2020  [facility] Cardiologist: Rozann Lesches, MD   Subjective   Still with NG tube in TEE rescheduled for Monday 10:00  Inpatient Medications    Scheduled Meds:  aspirin  81 mg Oral Daily   bisacodyl  10 mg Rectal Daily   budesonide (PULMICORT) nebulizer solution  0.25 mg Nebulization BID   Carbidopa-Levodopa ER  1 tablet Oral QID   carvedilol  25 mg Oral BID WC   Chlorhexidine Gluconate Cloth  6 each Topical Daily   diltiazem  90 mg Oral Q6H   docusate sodium  100 mg Oral BID   heparin  5,000 Units Subcutaneous Q8H   insulin aspart  0-9 Units Subcutaneous Q4H   lisinopril  40 mg Per Tube Daily   loratadine  10 mg Oral Daily   metoCLOPramide (REGLAN) injection  10 mg Intravenous Q12H   mupirocin ointment  1 application Nasal BID   pantoprazole (PROTONIX) IV  40 mg Intravenous Q24H   PARoxetine  40 mg Oral Daily   And   PARoxetine  20 mg Oral QHS   phenytoin (DILANTIN) IV  100 mg Intravenous BID   polyethylene glycol  17 g Per Tube Daily   senna  1 tablet Per Tube BID   sodium chloride flush  10-40 mL Intracatheter Q12H   sodium chloride flush  3 mL Intravenous Q12H   Continuous Infusions:  sodium chloride 10 mL/hr at 12/01/20 0800   nafcillin (NAFCIL) continuous infusion 20.8 mL/hr at 12/01/20 0800   phenytoin (DILANTIN) IV Stopped (11/30/20 2221)   PRN Meds: acetaminophen **OR** acetaminophen, albuterol, HYDROmorphone (DILAUDID) injection, hydrOXYzine, LORazepam, menthol-cetylpyridinium **OR** phenol, ondansetron **OR** ondansetron (ZOFRAN) IV, oxyCODONE, sodium chloride flush, sodium chloride flush, sodium phosphate   Vital Signs    Vitals:   12/01/20 0600 12/01/20 0700 12/01/20 0800 12/01/20 0842  BP: 104/70 101/77 95/65   Pulse: 80 79 79 74  Resp: 14 (!) 9 11 14   Temp:   97.7 F (36.5 C)   TempSrc:   Oral   SpO2: 96% 97% 96% 96%  Weight:      Height:         Intake/Output Summary (Last 24 hours) at 12/01/2020 0909 Last data filed at 12/01/2020 0800 Gross per 24 hour  Intake 677.34 ml  Output 1150 ml  Net -472.66 ml   Last 3 Weights 12/01/2020 11/30/2020 11/29/2020  Weight (lbs) 213 lb 6.5 oz 207 lb 10.8 oz 207 lb 0.2 oz  Weight (kg) 96.8 kg 94.2 kg 93.9 kg      Telemetry    NSR - Personally Reviewed  ECG      Physical Exam   GEN: No acute distress.  NG tube in place Neck: No JVD Cardiac: RRR, no murmurs, rubs, or gallops.  Respiratory: Clear to auscultation bilaterally. GI: Soft, nontender, non-distended  MS: No edema; No deformity. Neuro:  Nonfocal  Psych: Normal affect   Labs    High Sensitivity Troponin:  No results for input(s): TROPONINIHS in the last 720 hours.   Chemistry Recent Labs  Lab 11/25/20 0306 11/26/20 0454 11/27/20 0348 11/27/20 0851 11/28/20 0418 11/29/20 0436  NA 124*   < > 130*  --  135 131*  K 3.2*   < > 4.4  --  3.7 3.9  CL 90*   < > 95*  --  97* 96*  CO2 25   < > 21*  --  22 24  GLUCOSE 154*   < > 128*  --  181* 133*  BUN 14   < > 23  --  16 20  CREATININE 0.79   < > 1.05  --  1.08 0.92  CALCIUM 7.4*   < > 8.6*  --  8.4* 8.3*  MG 1.9  --   --  1.9 1.9  --   PROT 5.6*   < > 5.9*  --  5.8* 5.3*  ALBUMIN 2.7*   < > 2.5*  --  2.3* 2.1*  AST 41   < > 393*  --  296* 145*  ALT 9   < > 77*  --  103* 13  ALKPHOS 60   < > 50  --  53 50  BILITOT 0.6   < > 1.0  --  1.7* 1.9*  GFRNONAA >60   < > >60  --  >60 >60  ANIONGAP 9   < > 14  --  16* 11   < > = values in this interval not displayed.    Lipids No results for input(s): CHOL, TRIG, HDL, LABVLDL, LDLCALC, CHOLHDL in the last 168 hours.  Hematology Recent Labs  Lab 11/28/20 0418 11/29/20 0436 11/30/20 0522  WBC 5.8 7.5 6.7  RBC 3.08* 3.05* 2.99*  HGB 9.6* 9.5* 9.5*  HCT 28.4* 27.8* 27.7*  MCV 92.2 91.1 92.6  MCH 31.2 31.1 31.8  MCHC 33.8 34.2 34.3  RDW 12.9 13.1 13.1  PLT 97* 133* 164   Thyroid  Recent Labs  Lab 11/27/20 0851   TSH 3.489    BNP Recent Labs  Lab 11/27/20 0851  BNP 249.2*    DDimer No results for input(s): DDIMER in the last 168 hours.   Radiology    DG Abd 1 View  Result Date: 11/30/2020 CLINICAL DATA:  Ileus EXAM: ABDOMEN - 1 VIEW COMPARISON:  11/28/2020 FINDINGS: Persistent air distended large and small bowel loops throughout the abdomen, similar in appearance to the previous study. No gross free intraperitoneal air on portable supine view. Enteric tube tip terminates just beyond the level of the GE junction. Extensive spinal fusion hardware. IMPRESSION: 1. Persistent air distended large and small bowel loops throughout the abdomen, similar in appearance to the previous study and suggestive of ileus. 2. Enteric tube tip terminates just beyond the level of the GE junction. Electronically Signed   By: Davina Poke D.O.   On: 11/30/2020 15:28   DG CHEST PORT 1 VIEW  Result Date: 11/29/2020 CLINICAL DATA:  Encounter for removal of tunneled central venous catheter. EXAM: PORTABLE CHEST 1 VIEW COMPARISON:  Chest radiograph 11/27/2020, CT 11/24/2020 FINDINGS: New right upper extremity PICC tip in the lower SVC. Enteric tube in place with tip and side-port below the diaphragm. Hazy opacity throughout the right hemithorax may represent layering pleural effusion, atelectasis, or combination there of. Low lung volumes persist. Stable heart size and mediastinal contours, heart size accentuated by low volume technique. No pneumothorax. Bronchovascular crowding versus vascular congestion. IMPRESSION: 1. Right upper extremity PICC tip in the lower SVC, new from prior exam. 2. Enteric tube in place. 3. Hazy opacity throughout the right hemithorax may represent layering pleural effusion, atelectasis, or combination of. Electronically Signed   By: Keith Rake M.D.   On: 11/29/2020 18:08   [place] EKG SITE RITE  Result Date: 11/29/2020 If [facility] image not attached, placement could not be confirmed due to  current cardiac rhythm.   Cardiac Studies     Patient  Profile     65 y.o. adult with Staph bacteremia  Assessment & Plan    Atrial flutter converted to NSR.  On IV Amio which hopefully is short term.  CHADs vasc at least 2, but not currently a candidate for anticoagulation.   Planning for TEE due to bacteremia.  Not able to be done Friday Has been scheduled for Monday 10:00 Nurse indicates NG tube hopefully will be out over weekend would be nice not to have to remove and re insert for TEE      For questions or updates, please contact [place] Please consult [email or web address] for contact info under        Signed, Jenkins Rouge, MD  12/01/2020, 9:09 AM   Patient ID: [name], adult   DOB: Feb 05, 1956, 65 y.o.   MRN: [id number]

## 2020-12-01 NOTE — Consult Note (Signed)
[facility] New Psychiatric Evaluation   Service Date: December 01, 2020 LOS:  LOS: 7 days    Assessment  Danny Hampton is a 65 y.o. adult admitted medically for 11/23/2020  7:37 PM for abdominal pain; he was later found to have an ileus. He carries the psychiatric diagnoses of generalized anxiety disorder and has a past medical history of  acid reflux, dysrhythmia, essential hypertension, high cholesterol, myotonia congenita, type 2 diabetes mellitus .Psychiatry was consulted for parasuicidal statements.     His current presentation of feeling like his current life is not worth living (while maintaining hope he gets better) is most consistent with demoralization syndrome. He meets many of the criteria for MDD although this is closely intertwined with overall health status. Current outpatient psychotropic medications include paroxetine and lorazepam and historically he has had a fair response to these medications. He was generally compliant with medications prior to admission. On initial examination, patient had seen no improvement when paroxetine was titrated from 20 to 60 mg over the past several months. We will work towards reducing this medication for several reasons; chief among them its sedating, anticholinergic, and constipating tendencies in this pt with recent hx ICU delirium and ileus. Will likely not make further decreases this hospitalization due to risk for discontinuation syndrome (paroxetine is quite short acting). Please see plan below for detailed recommendations.   Diagnoses:  Active Hospital problems: Principal Problem:   Staphylococcus aureus bacteremia Active Problems:   Acute respiratory failure with hypoxia (HCC)   GERD (gastroesophageal reflux disease)   Myotonia congenita   Hyperlipidemia   Intractable abdominal pain   Abdominal distension   Generalized abdominal pain   Postoperative wound infection   Ileus, postoperative (HCC)   Acute  exacerbation of chronic low back pain   Ileus (HCC)   Abnormal transaminases    Problems edited/added by me: No problems updated.    Plan  ## Safety and Observation Level:  - Based on my clinical evaluation, I estimate the patient to be at low risk of suicide in the current setting - At this time, we recommend routine psychiatric followup. This decision is based on my review of the chart including patient's history and current presentation, interview of the patient, mental status examination, and consideration of suicide risk including evaluating suicidal ideation, plan, intent, suicidal or self-harm behaviors, risk factors, and protective factors. This judgment is based on our ability to directly address suicide risk, implement suicide prevention strategies and develop a safety plan while the patient is in the clinical setting. Please contact our team if there is a concern that risk level has changed.  ## Medications:  Standing  -- DECREASE paroxetine from 40 mg/20 mg to 20 mg BID  PRN  -- no changes to current PRN regimen (hydroxyzine as first line with lorazepam available)   ## Medical Decision Making Capacity:  Not specifically assessed this hospitalization  ## Further Work-up:  -- per primary team  ## Disposition:  -- no psychiatric contraindication to discharge when medically appropriate   Thank you for this consult request. Recommendations have been communicated to the primary team.  We will continue to follow at this time.  Hobson A Florean Hoobler    NEW vs followup history  Relevant Aspects of Hospital Course:  Admitted on 11/23/2020 for postoperative ileus; was found to be positive for MSSA bacteremia for which he is on nafcillin; further complicated by afib with RVR to 1702.   Patient Report:  Patient seen  in mid afternoon.   Patient is oriented to self, location, situation, month and year. He does DOWB and MOYB backwards through July without error.  He knows  that he asked to see psychiatry because he was having a really rough time yesterday. He was very confused and angry because his girlfriend had told him that the [place] was going to stop paying for his hospitalization. Generally he has been less than satisfied with [facility] care and benefits. Right now he is in a holding pattern. He denies making suicidal statements - states that he told the hospitalist if he was no longer going to be cared for he would prefer to be put to rest. Thinks things are generally going well - having BM, feeling a lot better. His ideal outcome is going home, living with his girlfriend, and having a nice retirement. Physical therapy came for the first time yesterday.   He declines ability of psychiatry to talk to his girlfriend. She told him that he can't go home because she can't take care of him. Today he heard that the [place] wants to continue to pay for his care and is in better spirits.   Wound vac alarm goes off 3 times during interview.   States he is here because he had a lot of pain in his stomach, and things got all complicated and became more serious.    He describes periods of delirium which had been scary to him.   Later gives permission ot call girlfriend. The gun is locked up. He has the keys to the safe.   He does not feel that the 60 mg is working any better than 20 mg for his anxiety. He was surprised he was kept on it.   No trauma history.   ROS:  Having BM (improvement). Having HA, stomach pain, back pain (radiating down back).    Mood Mood:  Endorses sadness/low mood which does improve when good events occur and has lasted for a period of since hospitalization Sleep:good for past few days Interest/motivation: Some reduction in former interests/activities hopelessness/helplessness: denies.  activity level/energy: Makes significant personal effort to initiate or carry out usual daily activities concentration: Struggles to focus attention or make decisions most  of the time appetite:  NPO psychomotor:Normal speed of thinking, gesturing, speaking suicidality: denies active and passive suicidal thoughts homicidality: Denies irritability: endorses. On interview interview irritability appears to be Mild  Denies AH/VH - had seen some strange things earlier in hospitalization when delirious.   Collateral information:  Spoke to his girlfriend - he had told her that there a similar story re: planning to die rather than live in pain. He has made these statements before several times, generally in response to his pain ("You'd be much better with me dead"). He has what sounds like prior impulsive suicidal statements when drinking (years ago). She describes a chronic tendency of feeling like he's not good enough. He sees a doctor at the [place] [facility] in [place] every 6 months who refuses to let him in with her. They have been together for 22 years. She says that the guns in the house are laying beside his bed table and in the closet (she has already hidden the guns). He does not have a therapist.   Psychiatric History:  Information collected from patient.  Patient has been diagnosed with anxiety - gets paxil. Started on hydroxyzine, then moved through several pills. The paxil is not really working. It helped at first but never really got down  to it. Recently he got a new doctor who put him on lorazepam for the anxiety - getting about every other day in the hospital. Mostly his anxiety comes out like anger.    Medical History: Past Medical History:  Diagnosis Date  . Acid reflux   . Anxiety   . Arrhythmia    paroxysmal supreventricular tachycardia  . Arthritis   . Complication of anesthesia    Myotonia congenita  . Dysrhythmia   . Essential hypertension   . Family history of adverse reaction to anesthesia    Sister  . High cholesterol   . History of skin cancer   . Myotonia congenita   . Type 2 diabetes mellitus [facility])     Surgical History: Past  Surgical History:  Procedure Laterality Date  . APPLICATION OF WOUND VAC  11/27/2020   Procedure: APPLICATION OF WOUND VAC;  Surgeon: Kristeen Miss, MD;  Location: [facility];  Service: Neurosurgery;;  . BACK SURGERY     times 2  . CARPAL TUNNEL RELEASE    . CATARACT EXTRACTION W/PHACO Left 01/31/2019   Procedure: CATARACT EXTRACTION PHACO AND INTRAOCULAR LENS PLACEMENT (IOC);  Surgeon: Baruch Goldmann, MD;  Location: [facility];  Service: Ophthalmology;  Laterality: Left;  CDE: 2.66  . LUMBAR WOUND DEBRIDEMENT N/A 11/27/2020   Procedure: LUMBAR WOUND DEBRIDEMENT AND WASHOUT;  Surgeon: Kristeen Miss, MD;  Location: [facility];  Service: Neurosurgery;  Laterality: N/A;  . NOSE SURGERY    . TOTAL KNEE ARTHROPLASTY Right 10/12/2018   Procedure: RIGHT TOTAL KNEE ARTHROPLASTY;  Surgeon: Garald Balding, MD;  Location: [facility];  Service: Orthopedics;  Laterality: Right;    Medications:   Current Facility-Administered Medications:  .  0.9 %  sodium chloride infusion, 250 mL, Intravenous, Continuous, Elsner, Henry, MD, Last Rate: 10 mL/hr at 12/01/20 1500, Infusion Verify at 12/01/20 1500 .  acetaminophen (TYLENOL) tablet 650 mg, 650 mg, Oral, Q4H PRN **OR** acetaminophen (TYLENOL) suppository 650 mg, 650 mg, Rectal, Q4H PRN, Kristeen Miss, MD .  albuterol (PROVENTIL) (2.5 MG/3ML) 0.083% nebulizer solution 2.5 mg, 2.5 mg, Nebulization, Q2H PRN, Minor, Grace Bushy, NP, 2.5 mg at 11/28/20 1949 .  aspirin chewable tablet 81 mg, 81 mg, Oral, Daily, Minor, Grace Bushy, NP, 81 mg at 12/01/20 0849 .  bisacodyl (DULCOLAX) suppository 10 mg, 10 mg, Rectal, Daily, Meuth, Brooke A, PA-C, 10 mg at 12/01/20 0850 .  budesonide (PULMICORT) nebulizer solution 0.25 mg, 0.25 mg, Nebulization, BID, Chand, Sudham, MD, 0.25 mg at 12/01/20 0842 .  Carbidopa-Levodopa ER 48.75-195 MG CPCR 1 tablet, 1 tablet, Oral, QID, Minor, Grace Bushy, NP, 1 tablet at 12/01/20 1521 .  carvedilol (COREG) tablet 25 mg, 25 mg, Oral, BID WC, Jacky Kindle, MD,  25 mg at 12/01/20 0729 .  Chlorhexidine Gluconate Cloth 2 % PADS 6 each, 6 each, Topical, Daily, Minor, Grace Bushy, NP, 6 each at 12/01/20 [id number] .  diltiazem (CARDIZEM) tablet 90 mg, 90 mg, Oral, Q6H, Chand, Sudham, MD, 90 mg at 12/01/20 1210 .  docusate sodium (COLACE) capsule 100 mg, 100 mg, Oral, BID, Kristeen Miss, MD, 100 mg at 12/01/20 0850 .  heparin injection 5,000 Units, 5,000 Units, Subcutaneous, Q8H, Minor, Grace Bushy, NP, 5,000 Units at 12/01/20 1521 .  HYDROmorphone (DILAUDID) injection 0.5-1 mg, 0.5-1 mg, Intravenous, Q3H PRN, Minor, Grace Bushy, NP, 1 mg at 12/01/20 8938 .  hydrOXYzine (ATARAX/VISTARIL) tablet 25 mg, 25 mg, Oral, TID PRN, Minor, Grace Bushy, NP, 25 mg at 12/01/20 0850 .  insulin aspart (novoLOG)  injection 0-9 Units, 0-9 Units, Subcutaneous, Q4H, Jacky Kindle, MD, 1 Units at 11/30/20 2342 .  lisinopril (ZESTRIL) tablet 40 mg, 40 mg, Per Tube, Daily, Jacky Kindle, MD, 40 mg at 12/01/20 0850 .  loratadine (CLARITIN) tablet 10 mg, 10 mg, Oral, Daily, Minor, Grace Bushy, NP, 10 mg at 12/01/20 0850 .  LORazepam (ATIVAN) injection 0.5 mg, 0.5 mg, Intravenous, TID PRN, Minor, Grace Bushy, NP, 0.5 mg at 11/30/20 2130 .  menthol-cetylpyridinium (CEPACOL) lozenge 3 mg, 1 lozenge, Oral, PRN **OR** phenol (CHLORASEPTIC) mouth spray 1 spray, 1 spray, Mouth/Throat, PRN, Kristeen Miss, MD .  metoCLOPramide (REGLAN) injection 10 mg, 10 mg, Intravenous, Q12H, Minor, Grace Bushy, NP, 10 mg at 12/01/20 0849 .  mupirocin ointment (BACTROBAN) 2 % 1 application, 1 application, Nasal, BID, Kristeen Miss, MD, 1 application at 46/80/32 [phone] .  nafcillin 12 g in sodium chloride 0.9 % 500 mL continuous infusion, 12 g, Intravenous, Q24H, Minor, Grace Bushy, NP, Last Rate: 20.4 mL/hr at 12/01/20 1500, Infusion Verify at 12/01/20 1500 .  ondansetron (ZOFRAN) tablet 4 mg, 4 mg, Oral, Q6H PRN **OR** ondansetron (ZOFRAN) injection 4 mg, 4 mg, Intravenous, Q6H PRN, Kristeen Miss, MD .  oxyCODONE (Oxy IR/ROXICODONE)  immediate release tablet 10 mg, 10 mg, Oral, Q6H PRN, Minor, Grace Bushy, NP, 10 mg at 12/01/20 1210 .  pantoprazole (PROTONIX) injection 40 mg, 40 mg, Intravenous, Q24H, Minor, Grace Bushy, NP, 40 mg at 12/01/20 0849 .  PARoxetine (PAXIL) tablet 40 mg, 40 mg, Oral, Daily, 40 mg at 12/01/20 0849 **AND** PARoxetine (PAXIL) tablet 20 mg, 20 mg, Oral, QHS, Minor, Grace Bushy, NP, 20 mg at 11/30/20 2117 .  phenytoin (DILANTIN) 200 mg in sodium chloride 0.9 % 100 mL IVPB, 200 mg, Intravenous, QHS, Ursula Beath, RPH, Stopped at 11/30/20 2221 .  phenytoin (DILANTIN) injection 100 mg, 100 mg, Intravenous, BID, Ursula Beath, RPH, 100 mg at 12/01/20 1521 .  polyethylene glycol (MIRALAX / GLYCOLAX) packet 17 g, 17 g, Per Tube, Daily, Esterwood, Amy S, PA-C, 17 g at 12/01/20 0849 .  senna (SENOKOT) tablet 8.6 mg, 1 tablet, Per Tube, BID, Kristeen Miss, MD, 8.6 mg at 12/01/20 0850 .  sodium chloride flush (NS) 0.9 % injection 10-40 mL, 10-40 mL, Intracatheter, Q12H, Chand, Currie Paris, MD, 20 mL at 12/01/20 0852 .  sodium chloride flush (NS) 0.9 % injection 10-40 mL, 10-40 mL, Intracatheter, PRN, Tacy Learn, Sudham, MD .  sodium chloride flush (NS) 0.9 % injection 3 mL, 3 mL, Intravenous, Q12H, Kristeen Miss, MD, 3 mL at 12/01/20 8250 .  sodium chloride flush (NS) 0.9 % injection 3 mL, 3 mL, Intravenous, PRN, Kristeen Miss, MD .  sodium phosphate (FLEET) 7-19 GM/118ML enema 1 enema, 1 enema, Rectal, Once PRN, Kristeen Miss, MD  Allergies: No Known Allergies  Social History:  Lives with girlfriend of 30 years  The patient's family history includes Cancer in his father; Healthy in his father and mother.    Objective  Vital signs:  Temp:  [97.6 F (36.4 C)-99 F (37.2 C)] 98.6 F (37 C) (09/24 1200) Pulse Rate:  [72-97] 73 (09/24 1500) Resp:  [7-22] 11 (09/24 1500) BP: (83-112)/(53-84) 95/64 (09/24 1500) SpO2:  [89 %-98 %] 98 % (09/24 1500) Weight:  [96.8 kg] 96.8 kg (09/24 0500)  Physical Exam: General: lying  in bed, ill appearing Respiratory: no increased work of breathing  Neuro/MSK: deferred gait due to debility, no obvious tremors, CNII-XII grossly intact Skin: pale  Mental status exam On my mental status  examination presently, the patient is alert, and grossly oriented to person, place, and time.  He is attentive to exam but has some difficulty with formal attention testing. Appearance:  Calm, ill-appearing, appears stated age.  Speech spontaneous and normal in volume, rate, and rhythm.  Mood:  "Better today".  Affect:  Full.  Thought process:  Linear associations, normal rate, logical.  Thought content is devoid of suicidal, homicidal, violent, and delusional material.  There are no evident hallucinations, he endorsed some visual hallucinations over the past week when delirious.  Judgment is fair.  Insight is fair.  Recent and remote memory:  Grossly intact.  Language is intact.  Fund of knowledge:  Grossly intact.   Data Reviewed: Physician and nursing notes  Additional Psychometric Testing: none

## 2020-12-01 NOTE — Progress Notes (Signed)
   Patient Name: Righteous Claiborne Date of Encounter: 12/01/2020, 10:48 AM    Subjective  2 BM's Abdomen less swollen   Objective  BP 96/61   Pulse 72   Temp 97.7 F (36.5 C) (Oral)   Resp 13   Ht 5\' 4"  (1.626 m)   Wt 96.8 kg   SpO2 97%   BMI 36.63 kg/m  Obese wm Abd large, obese, distended, softer than before and no hi pitched BS and not tender  Lab Results  Component Value Date   CREATININE 0.93 12/01/2020   BUN 11 12/01/2020   NA 134 (L) 12/01/2020   K 3.4 (L) 12/01/2020   CL 98 12/01/2020   CO2 26 12/01/2020   Lab Results  Component Value Date   WBC 7.2 12/01/2020   HGB 8.6 (L) 12/01/2020   HCT 26.0 (L) 12/01/2020   MCV 93.5 12/01/2020   PLT 197 12/01/2020    200 cc out NG tube yesterday   Assessment and Plan  Ileus - improved   DC NGT Clears and advance to full if tol Electrolyte and nutrition support  GI is signing off Patient does not need outpatient GI appt at [place], MD, [facility] 12/01/2020 10:48 AM

## 2020-12-01 NOTE — Progress Notes (Addendum)
PROGRESS NOTE    Danny Hampton  VQM:[id number] DOB: 1955/07/27 DOA: 11/23/2020 PCP: [facility] [place]   Chief Complaint  Patient presents with   Constipation   Brief Narrative:  65 yo with hx GERD, dysrhythmia, HTN, HLD, myotonia congenita, T2DM, and recent lumbar decompression and extension of posterior spinal instrumented fusion (discharged 8/27) who presented to [facility] on 9/17 with intractable abdominal pain and was found to have an ileus.  He was transferred to [facility] for neurosurgery evaluation and management in the setting of MSSA bacteremia and post operative wound infection.  He's now s/p debridement of postoperative wound infection on 9/20 and he was transferred to the ICU on this date.  Hospitalization was complicated by atrial fibrillation with RVR.    Transferred to hospitalist 9/24.  Plan is for TEE Monday, 9/26.  Continue nafcillin.  NG in place, currently clamped.    ID, cardiology, GI are following.  Surgery and PCCM have signed off.  Assessment & Plan:   Principal Problem:   Staphylococcus aureus bacteremia Active Problems:   Acute respiratory failure with hypoxia (HCC)   GERD (gastroesophageal reflux disease)   Myotonia congenita   Hyperlipidemia   Intractable abdominal pain   Abdominal distension   Generalized abdominal pain   Postoperative wound infection   Ileus, postoperative (HCC)   Acute exacerbation of chronic low back pain   Ileus (HCC)   Abnormal transaminases  MSSA Bacteremia  Postoperative Lumbar Wound Infection S/p debridement of postop lumbar wound infection 9/20 9/17 cx show MSSA bacteremia 9/18 wound cx and 9/20 soft tissue abscess with MSSA 9//18 blood cx no growth Echo 9/18 with no RWMA, mildly reduced RVSF - no evidence of valvular vegetations Plan for TEE on 9/26 ID c/s, appreciate recs - planning for nafcillin x2 weeks, then cefazolin to conclude 6 weeks, followed by prolonged course of oral abx Wound vac in place, per  NSGY, appreciate recommendations  Ileus NG clamped KUB 9/23 with persistent air distended large and small bowel loops throughout abdomen Appreciate GI recommendations - continue reglan, minimize narcotics  Atrial Fibrillation with RVR Diltiazem 90 q6, coreg 25 mg BID Cardiology c/s, appreciate recs Not currently candidate for anticoagulation  Hypertension Lisinopril, coreg HCTZ on hold  HLD Continue lipitor Continue aspirin  Myotonia Congenita He notes sinemet and dilantin for this ? If ropinirole also for this - will clarify  Elevated bilirubin Follow   Suicidal Statement Psychiatry c/s, appreciate recommendations Continue paxil, ativan prn hydroxyzine  DVT prophylaxis: heparin Code Status: full Family Communication: none at bedside Disposition:   Status is: Inpatient  Remains inpatient appropriate because:Inpatient level of care appropriate due to severity of illness  Dispo: The patient is from:  home              Anticipated d/c is to:  pending              Patient currently is not medically stable to d/c.   Difficult to place patient No       Consultants:  Surgery IG [facility] [place] PCCM ID GI  Procedures:  Debridement of postoperative lumbar wound infection 9/20  Echo IMPRESSIONS     1. Left ventricular ejection fraction, by estimation, is 60 to 65%. The  left ventricle has normal function. The left ventricle has no regional  wall motion abnormalities. There is mild concentric left ventricular  hypertrophy. Left ventricular diastolic  parameters were normal.   2. Right ventricular systolic function is mildly reduced. The right  ventricular size is moderately enlarged. Tricuspid regurgitation signal is  inadequate for assessing PA pressure.   3. The mitral valve is grossly normal. Trivial mitral valve  regurgitation. No evidence of mitral stenosis.   4. The aortic valve is tricuspid. Aortic valve regurgitation is not  visualized. No  aortic stenosis is present.   5. The inferior vena cava is normal in size with <50% respiratory  variability, suggesting right atrial pressure of 8 mmHg.   Comparison(s): Changes from prior study are noted. LV function remains  unchanged. RV appears moderately dilated with mildly reduced function  which is new from prior study.   Conclusion(s)/Recommendation(s): No evidence of valvular vegetations on  this transthoracic echocardiogram. Would recommend Aracelli Woloszyn transesophageal  echocardiogram to exclude infective endocarditis if clinically indicated. Antimicrobials:  Anti-infectives (From admission, onward)    Start     Dose/Rate Route Frequency Ordered Stop   11/27/20 2145  ceFAZolin (ANCEF) IVPB 2g/100 mL premix        2 g 200 mL/hr over 30 Minutes Intravenous Every 8 hours 11/27/20 2052 11/28/20 0619   11/27/20 1200  nafcillin 12 g in sodium chloride 0.9 % 500 mL continuous infusion        12 g 20.8 mL/hr over 24 Hours Intravenous Every 24 hours 11/27/20 1007     11/25/20 1400  ceFAZolin (ANCEF) IVPB 2g/100 mL premix  Status:  Discontinued        2 g 200 mL/hr over 30 Minutes Intravenous Every 8 hours 11/25/20 0915 11/27/20 1007   11/24/20 1400  piperacillin-tazobactam (ZOSYN) IVPB 3.375 g  Status:  Discontinued       See Hyperspace for full Linked Orders Report.   3.375 g 12.5 mL/hr over 240 Minutes Intravenous Every 8 hours 11/24/20 0755 11/25/20 0915   11/24/20 0800  piperacillin-tazobactam (ZOSYN) IVPB 3.375 g       See Hyperspace for full Linked Orders Report.   3.375 g 100 mL/hr over 30 Minutes Intravenous  Once 11/24/20 0755 11/24/20 0925       Subjective:  Feeling gradually better  Objective: Vitals:   12/01/20 0600 12/01/20 0700 12/01/20 0800 12/01/20 0842  BP: 104/70 101/77 95/65   Pulse: 80 79 79 74  Resp: 14 (!) 9 11 14   Temp:   97.7 F (36.5 C)   TempSrc:   Oral   SpO2: 96% 97% 96% 96%  Weight:      Height:        Intake/Output Summary (Last 24 hours) at  12/01/2020 0901 Last data filed at 12/01/2020 0800 Gross per 24 hour  Intake 677.34 ml  Output 1150 ml  Net -472.66 ml   Filed Weights   11/29/20 0447 11/30/20 0500 12/01/20 0500  Weight: 93.9 kg 94.2 kg 96.8 kg    Examination:  General exam: Appears calm and comfortable  Respiratory system: unlabored Cardiovascular system: RRR Gastrointestinal system: Abdomen is distended, nontender - NG in place Central nervous system: Alert and oriented. No focal neurological deficits. Extremities: no LEE Skin: back with wound vac Psychiatry: Judgement and insight appear normal. Mood & affect appropriate.     Data Reviewed: I have personally reviewed following labs and imaging studies  CBC: Recent Labs  Lab 11/26/20 0454 11/27/20 0348 11/28/20 0418 11/29/20 0436 11/30/20 0522  WBC 6.4 5.8 5.8 7.5 6.7  NEUTROABS 5.3 4.6 4.3 5.2 4.5  HGB 9.8* 10.0* 9.6* 9.5* 9.5*  HCT 29.5* 28.7* 28.4* 27.8* 27.7*  MCV 95.5 92.0 92.2 91.1 92.6  PLT 83* 77*  97* 133* 500    Basic Metabolic Panel: Recent Labs  Lab 11/25/20 0306 11/26/20 0454 11/27/20 0348 11/27/20 0851 11/28/20 0418 11/29/20 0436  NA 124* 124* 130*  --  135 131*  K 3.2* 4.5 4.4  --  3.7 3.9  CL 90* 91* 95*  --  97* 96*  CO2 25 22 21*  --  22 24  GLUCOSE 154* 138* 128*  --  181* 133*  BUN 14 25* 23  --  16 20  CREATININE 0.79 1.36* 1.05  --  1.08 0.92  CALCIUM 7.4* 7.6* 8.6*  --  8.4* 8.3*  MG 1.9  --   --  1.9 1.9  --   PHOS  --   --   --  2.4* 2.5  --     GFR: Estimated Creatinine Clearance (by C-G formula based on SCr of 0.92 mg/dL) Male: 68.8 mL/min Male: 84 mL/min  Liver Function Tests: Recent Labs  Lab 11/25/20 0306 11/26/20 0454 11/27/20 0348 11/28/20 0418 11/29/20 0436  AST 41 330* 393* 296* 145*  ALT 9 13 77* 103* 13  ALKPHOS 60 55 50 53 50  BILITOT 0.6 0.8 1.0 1.7* 1.9*  PROT 5.6* 6.4* 5.9* 5.8* 5.3*  ALBUMIN 2.7* 2.8* 2.5* 2.3* 2.1*    CBG: Recent Labs  Lab 11/30/20 1535 11/30/20 1928  11/30/20 2330 12/01/20 0309 12/01/20 0718  GLUCAP 114* 118* 130* 117* 117*     Recent Results (from the past 240 hour(s))  Resp Panel by RT-PCR (Flu Lindberg Zenon&B, Covid) Nasopharyngeal Swab     Status: None   Collection Time: 11/24/20 12:40 AM   Specimen: Nasopharyngeal Swab; Nasopharyngeal(NP) swabs in vial transport medium  Result Value Ref Range Status   SARS Coronavirus 2 by RT PCR NEGATIVE NEGATIVE Final    Comment: (NOTE) SARS-CoV-2 target nucleic acids are NOT DETECTED.  The SARS-CoV-2 RNA is generally detectable in upper respiratory specimens during the acute phase of infection. The lowest concentration of SARS-CoV-2 viral copies this assay can detect is 138 copies/mL. Lujain Kraszewski negative result does not preclude SARS-Cov-2 infection and should not be used as the sole basis for treatment or other patient management decisions. Cleotis Sparr negative result may occur with  improper specimen collection/handling, submission of specimen other than nasopharyngeal swab, presence of viral mutation(s) within the areas targeted by this assay, and inadequate number of viral copies(<138 copies/mL). Herny Scurlock negative result must be combined with clinical observations, patient history, and epidemiological information. The expected result is Negative.  Fact Sheet for Patients:  EntrepreneurPulse.com.au  Fact Sheet for Healthcare Providers:  IncredibleEmployment.be  This test is no t yet approved or cleared by the [place] FDA and  has been authorized for detection and/or diagnosis of SARS-CoV-2 by FDA under an Emergency Use Authorization (EUA). This EUA will remain  in effect (meaning this test can be used) for the duration of the COVID-19 declaration under Section 564(b)(1) of the Act, 21 U.S.C.section 360bbb-3(b)(1), unless the authorization is terminated  or revoked sooner.       Influenza Song Myre by PCR NEGATIVE NEGATIVE Final   Influenza B by PCR NEGATIVE NEGATIVE Final     Comment: (NOTE) The Xpert Xpress SARS-CoV-2/FLU/RSV plus assay is intended as an aid in the diagnosis of influenza from Nasopharyngeal swab specimens and should not be used as Biran Mayberry sole basis for treatment. Nasal washings and aspirates are unacceptable for Xpert Xpress SARS-CoV-2/FLU/RSV testing.  Fact Sheet for Patients: EntrepreneurPulse.com.au  Fact Sheet for Healthcare Providers: IncredibleEmployment.be  This test is  not yet approved or cleared by the [place] and has been authorized for detection and/or diagnosis of SARS-CoV-2 by FDA under an Emergency Use Authorization (EUA). This EUA will remain in effect (meaning this test can be used) for the duration of the COVID-19 declaration under Section 564(b)(1) of the Act, 21 U.S.C. section 360bbb-3(b)(1), unless the authorization is terminated or revoked.  Performed at [facility], [address], [place], [place] [address]   Culture, blood (routine x 2)     Status: Abnormal   Collection Time: 11/24/20  8:15 AM   Specimen: BLOOD LEFT HAND  Result Value Ref Range Status   Specimen Description   Final    BLOOD LEFT HAND BOTTLES DRAWN AEROBIC AND ANAEROBIC Performed at [facility], [address], [place], [place] [address]    Special Requests   Final    Blood Culture adequate volume Performed at [facility], [address] [place], [place] [address]    Culture  Setup Time   Final    GRAM POSITIVE COCCI IN BOTH AEROBIC AND ANAEROBIC BOTTLES CRITICAL RESULT CALLED TO, READ BACK BY AND VERIFIED WITH: PHARM D L.POOLE ON [id number] AT 0818 BY E.PARRISH Performed at [facility], [place] [address] [place], [place] [address]    Culture STAPHYLOCOCCUS AUREUS (Avel Ogawa)  Final   Report Status 11/27/2020 FINAL  Final   Organism ID, Bacteria STAPHYLOCOCCUS AUREUS  Final      Susceptibility   Staphylococcus aureus - MIC*    CIPROFLOXACIN <=0.5 SENSITIVE Sensitive     ERYTHROMYCIN <=0.25 SENSITIVE  Sensitive     GENTAMICIN <=0.5 SENSITIVE Sensitive     OXACILLIN <=0.25 SENSITIVE Sensitive     TETRACYCLINE <=1 SENSITIVE Sensitive     VANCOMYCIN 1 SENSITIVE Sensitive     TRIMETH/SULFA <=10 SENSITIVE Sensitive     CLINDAMYCIN <=0.25 SENSITIVE Sensitive     RIFAMPIN <=0.5 SENSITIVE Sensitive     Inducible Clindamycin NEGATIVE Sensitive     * STAPHYLOCOCCUS AUREUS  Blood Culture ID Panel (Reflexed)     Status: Abnormal   Collection Time: 11/24/20  8:15 AM  Result Value Ref Range Status   Enterococcus faecalis NOT DETECTED NOT DETECTED Final   Enterococcus Faecium NOT DETECTED NOT DETECTED Final   Listeria monocytogenes NOT DETECTED NOT DETECTED Final   Staphylococcus species DETECTED (Isamar Nazir) NOT DETECTED Final    Comment: CRITICAL RESULT CALLED TO, READ BACK BY AND VERIFIED WITH: PHARM D L.POOLE ON [id number] AT 0818 BY E.PARRISH    Staphylococcus aureus (BCID) DETECTED (Tameeka Luo) NOT DETECTED Final    Comment: CRITICAL RESULT CALLED TO, READ BACK BY AND VERIFIED WITH: PHARM D L.POOLE ON [id number] AT 0818 BY E.PARRISH    Staphylococcus epidermidis NOT DETECTED NOT DETECTED Final   Staphylococcus lugdunensis NOT DETECTED NOT DETECTED Final   Streptococcus species NOT DETECTED NOT DETECTED Final   Streptococcus agalactiae NOT DETECTED NOT DETECTED Final   Streptococcus pneumoniae NOT DETECTED NOT DETECTED Final   Streptococcus pyogenes NOT DETECTED NOT DETECTED Final   Lilie Vezina.calcoaceticus-baumannii NOT DETECTED NOT DETECTED Final   Bacteroides fragilis NOT DETECTED NOT DETECTED Final   Enterobacterales NOT DETECTED NOT DETECTED Final   Enterobacter cloacae complex NOT DETECTED NOT DETECTED Final   Escherichia coli NOT DETECTED NOT DETECTED Final   Klebsiella aerogenes NOT DETECTED NOT DETECTED Final   Klebsiella oxytoca NOT DETECTED NOT DETECTED Final   Klebsiella pneumoniae NOT DETECTED NOT DETECTED Final   Proteus species NOT DETECTED NOT DETECTED Final   Salmonella  species NOT DETECTED NOT  DETECTED Final   Serratia marcescens NOT DETECTED NOT DETECTED Final   Haemophilus influenzae NOT DETECTED NOT DETECTED Final   Neisseria meningitidis NOT DETECTED NOT DETECTED Final   Pseudomonas aeruginosa NOT DETECTED NOT DETECTED Final   Stenotrophomonas maltophilia NOT DETECTED NOT DETECTED Final   Candida albicans NOT DETECTED NOT DETECTED Final   Candida auris NOT DETECTED NOT DETECTED Final   Candida glabrata NOT DETECTED NOT DETECTED Final   Candida krusei NOT DETECTED NOT DETECTED Final   Candida parapsilosis NOT DETECTED NOT DETECTED Final   Candida tropicalis NOT DETECTED NOT DETECTED Final   Cryptococcus neoformans/gattii NOT DETECTED NOT DETECTED Final   Meth resistant mecA/C and MREJ NOT DETECTED NOT DETECTED Final    Comment: Performed at [facility], [place] [address], [place], [place] [address]  Culture, blood (routine x 2)     Status: Abnormal   Collection Time: 11/24/20  8:16 AM   Specimen: BLOOD RIGHT HAND  Result Value Ref Range Status   Specimen Description   Final    BLOOD RIGHT HAND BOTTLES DRAWN AEROBIC AND ANAEROBIC Performed at [facility], [address], [place], [place] [address]    Special Requests   Final    Blood Culture adequate volume Performed at [facility], [address] [place], [place] [address]    Culture  Setup Time   Final    GRAM POSITIVE COCCI IN BOTH AEROBIC AND ANAEROBIC BOTTLES CRITICAL VALUE NOTED.  VALUE IS CONSISTENT WITH PREVIOUSLY REPORTED AND CALLED VALUE.    Culture (Loucile Posner)  Final    STAPHYLOCOCCUS AUREUS SUSCEPTIBILITIES PERFORMED ON PREVIOUS CULTURE WITHIN THE LAST 5 DAYS. Performed at [facility], [place] [address] [place], [place] [address]    Report Status 11/27/2020 FINAL  Final  MRSA Next Gen by PCR, Nasal     Status: None   Collection Time: 11/24/20  3:30 PM   Specimen: Nasal Mucosa; Nasal Swab  Result Value Ref Range Status   MRSA by PCR Next Gen NOT DETECTED NOT DETECTED Final    Comment: (NOTE) The  GeneXpert MRSA Assay (FDA approved for NASAL specimens only), is one component of Donye Campanelli comprehensive MRSA colonization surveillance program. It is not intended to diagnose MRSA infection nor to guide or monitor treatment for MRSA infections. Test performance is not FDA approved in patients less than 60 years old. Performed at [facility], [address] [place], [place] [address]   Aerobic Culture w Gram Stain (superficial specimen)     Status: None   Collection Time: 11/25/20 11:00 AM   Specimen: Back; Wound  Result Value Ref Range Status   Specimen Description   Final    BACK Performed at [facility], [address] [place], [place] [address]    Special Requests   Final    Normal Performed at [facility], [address], [place], [place] [address]    Gram Stain   Final    NO WBC SEEN FEW GRAM POSITIVE COCCI Performed at [facility], [place] [address], [place], [place] [address]    Culture ABUNDANT STAPHYLOCOCCUS AUREUS  Final   Report Status 11/29/2020 FINAL  Final   Organism ID, Bacteria STAPHYLOCOCCUS AUREUS  Final      Susceptibility   Staphylococcus aureus - MIC*    CIPROFLOXACIN <=0.5 SENSITIVE Sensitive     ERYTHROMYCIN <=0.25 SENSITIVE Sensitive     GENTAMICIN <=0.5 SENSITIVE Sensitive     OXACILLIN <=0.25 SENSITIVE  Sensitive     TETRACYCLINE <=1 SENSITIVE Sensitive     VANCOMYCIN 1 SENSITIVE Sensitive     TRIMETH/SULFA <=10 SENSITIVE Sensitive     CLINDAMYCIN <=0.25 SENSITIVE Sensitive     RIFAMPIN <=0.5 SENSITIVE Sensitive     Inducible Clindamycin NEGATIVE Sensitive     * ABUNDANT STAPHYLOCOCCUS AUREUS  Culture, blood (routine x 2)     Status: None   Collection Time: 11/25/20  3:42 PM   Specimen: BLOOD LEFT FOREARM  Result Value Ref Range Status   Specimen Description   Final    BLOOD LEFT FOREARM BOTTLES DRAWN AEROBIC AND ANAEROBIC   Special Requests Blood Culture adequate volume  Final   Culture   Final    NO GROWTH 5 DAYS Performed at [facility], [address], [place], [place] [address]    Report Status 11/30/2020 FINAL  Final  Culture, blood (routine x 2)     Status: None   Collection Time: 11/25/20  3:55 PM   Specimen: Right Antecubital; Blood  Result Value Ref Range Status   Specimen Description   Final    RIGHT ANTECUBITAL BOTTLES DRAWN AEROBIC AND ANAEROBIC   Special Requests Blood Culture adequate volume  Final   Culture   Final    NO GROWTH 5 DAYS Performed at [facility], [address], [place], [place] [address]    Report Status 11/30/2020 FINAL  Final  Aerobic Culture w Gram Stain (superficial specimen)     Status: None   Collection Time: 11/27/20  1:41 PM   Specimen: Wound  Result Value Ref Range Status   Specimen Description WOUND  Final   Special Requests SOFT TISSUE ABCESS  Final   Gram Stain   Final    ABUNDANT WBC PRESENT,BOTH PMN AND MONONUCLEAR RARE GRAM POSITIVE COCCI Performed at [facility], [place] [address], [place], [place] [address]    Culture FEW STAPHYLOCOCCUS AUREUS  Final   Report Status 11/29/2020 FINAL  Final   Organism ID, Bacteria STAPHYLOCOCCUS AUREUS  Final      Susceptibility   Staphylococcus aureus - MIC*    CIPROFLOXACIN <=0.5 SENSITIVE Sensitive     ERYTHROMYCIN <=0.25 SENSITIVE Sensitive     GENTAMICIN <=0.5 SENSITIVE Sensitive     OXACILLIN <=0.25 SENSITIVE Sensitive     TETRACYCLINE <=1 SENSITIVE Sensitive     VANCOMYCIN 1 SENSITIVE Sensitive     TRIMETH/SULFA <=10 SENSITIVE Sensitive     CLINDAMYCIN <=0.25 SENSITIVE Sensitive     RIFAMPIN <=0.5 SENSITIVE Sensitive     Inducible Clindamycin NEGATIVE Sensitive     * FEW STAPHYLOCOCCUS AUREUS  Surgical PCR screen     Status: Abnormal   Collection Time: 11/27/20  1:58 PM   Specimen: Nasal Mucosa; Nasal Swab  Result Value Ref Range Status   MRSA, PCR NEGATIVE NEGATIVE Final   Staphylococcus aureus POSITIVE (Sina Lucchesi) NEGATIVE Final    Comment: (NOTE) The Xpert SA Assay (FDA approved for NASAL specimens in patients  106 years of age and older), is one component of Exzavier Ruderman comprehensive surveillance program. It is not intended to diagnose infection nor to guide or monitor treatment. Performed at [facility], [place] [address], [place],  [address]          Radiology Studies: DG Abd 1 View  Result Date: 11/30/2020 CLINICAL DATA:  Ileus EXAM: ABDOMEN - 1 VIEW COMPARISON:  11/28/2020 FINDINGS: Persistent air distended large and small bowel loops throughout the abdomen, similar in appearance to the  previous study. No gross free intraperitoneal air on portable supine view. Enteric tube tip terminates just beyond the level of the GE junction. Extensive spinal fusion hardware. IMPRESSION: 1. Persistent air distended large and small bowel loops throughout the abdomen, similar in appearance to the previous study and suggestive of ileus. 2. Enteric tube tip terminates just beyond the level of the GE junction. Electronically Signed   By: Davina Poke D.O.   On: 11/30/2020 15:28   DG CHEST PORT 1 VIEW  Result Date: 11/29/2020 CLINICAL DATA:  Encounter for removal of tunneled central venous catheter. EXAM: PORTABLE CHEST 1 VIEW COMPARISON:  Chest radiograph 11/27/2020, CT 11/24/2020 FINDINGS: New right upper extremity PICC tip in the lower SVC. Enteric tube in place with tip and side-port below the diaphragm. Hazy opacity throughout the right hemithorax may represent layering pleural effusion, atelectasis, or combination there of. Low lung volumes persist. Stable heart size and mediastinal contours, heart size accentuated by low volume technique. No pneumothorax. Bronchovascular crowding versus vascular congestion. IMPRESSION: 1. Right upper extremity PICC tip in the lower SVC, new from prior exam. 2. Enteric tube in place. 3. Hazy opacity throughout the right hemithorax may represent layering pleural effusion, atelectasis, or combination of. Electronically Signed   By: Keith Rake M.D.   On: 11/29/2020 18:08    [place] EKG SITE RITE  Result Date: 11/29/2020 [facility] image not attached, placement could not be confirmed due to current cardiac rhythm.       Scheduled Meds:  aspirin  81 mg Oral Daily   bisacodyl  10 mg Rectal Daily   budesonide (PULMICORT) nebulizer solution  0.25 mg Nebulization BID   Carbidopa-Levodopa ER  1 tablet Oral QID   carvedilol  25 mg Oral BID WC   Chlorhexidine Gluconate Cloth  6 each Topical Daily   diltiazem  90 mg Oral Q6H   docusate sodium  100 mg Oral BID   heparin  5,000 Units Subcutaneous Q8H   insulin aspart  0-9 Units Subcutaneous Q4H   lisinopril  40 mg Per Tube Daily   loratadine  10 mg Oral Daily   metoCLOPramide (REGLAN) injection  10 mg Intravenous Q12H   mupirocin ointment  1 application Nasal BID   pantoprazole (PROTONIX) IV  40 mg Intravenous Q24H   PARoxetine  40 mg Oral Daily   And   PARoxetine  20 mg Oral QHS   phenytoin (DILANTIN) IV  100 mg Intravenous BID   polyethylene glycol  17 g Per Tube Daily   senna  1 tablet Per Tube BID   sodium chloride flush  10-40 mL Intracatheter Q12H   sodium chloride flush  3 mL Intravenous Q12H   Continuous Infusions:  sodium chloride 10 mL/hr at 12/01/20 0800   nafcillin (NAFCIL) continuous infusion 20.8 mL/hr at 12/01/20 0800   phenytoin (DILANTIN) IV Stopped (11/30/20 2221)     LOS: 7 days    Time spent: over 30 min    Fayrene Helper, MD [facility]   To contact the attending provider between 7A-7P or the covering provider during after hours 7P-7A, please log into the web site [email or web address] and access using universal [place] password for that web site. If you do not have the password, please call the hospital operator.  12/01/2020, 9:01 AM

## 2020-12-02 DIAGNOSIS — R7881 Bacteremia: Secondary | ICD-10-CM | POA: Diagnosis not present

## 2020-12-02 DIAGNOSIS — B9561 Methicillin susceptible Staphylococcus aureus infection as the cause of diseases classified elsewhere: Secondary | ICD-10-CM | POA: Diagnosis not present

## 2020-12-02 LAB — COMPREHENSIVE METABOLIC PANEL
ALT: 8 U/L (ref 0–44)
AST: 21 U/L (ref 15–41)
Albumin: 1.6 g/dL — ABNORMAL LOW (ref 3.5–5.0)
Alkaline Phosphatase: 39 U/L (ref 38–126)
Anion gap: 9 (ref 5–15)
BUN: 5 mg/dL — ABNORMAL LOW (ref 8–23)
CO2: 29 mmol/L (ref 22–32)
Calcium: 7.8 mg/dL — ABNORMAL LOW (ref 8.9–10.3)
Chloride: 97 mmol/L — ABNORMAL LOW (ref 98–111)
Creatinine, Ser: 0.74 mg/dL (ref 0.61–1.24)
GFR, Estimated: >60 mL/min (ref >60)
Glucose, Bld: 125 mg/dL — ABNORMAL HIGH (ref 70–99)
Potassium: 2.9 mmol/L — ABNORMAL LOW (ref 3.5–5.1)
Sodium: 135 mmol/L (ref 135–145)
Total Bilirubin: 1.1 mg/dL (ref 0.3–1.2)
Total Protein: 4.6 g/dL — ABNORMAL LOW (ref 6.5–8.1)

## 2020-12-02 LAB — CBC WITH DIFFERENTIAL/PLATELET
Abs Immature Granulocytes: 0 10*3/uL (ref 0.00–0.07)
Basophils Absolute: 0.1 10*3/uL (ref 0.0–0.1)
Basophils Relative: 1 %
Eosinophils Absolute: 0.3 10*3/uL (ref 0.0–0.5)
Eosinophils Relative: 4 %
HCT: 25.5 % — ABNORMAL LOW (ref 39.0–52.0)
Hemoglobin: 8.6 g/dL — ABNORMAL LOW (ref 13.0–17.0)
Lymphocytes Relative: 24 %
Lymphs Abs: 1.5 10*3/uL (ref 0.7–4.0)
MCH: 31.3 pg (ref 26.0–34.0)
MCHC: 33.7 g/dL (ref 30.0–36.0)
MCV: 92.7 fL (ref 80.0–100.0)
Monocytes Absolute: 0.3 10*3/uL (ref 0.1–1.0)
Monocytes Relative: 4 %
Neutro Abs: 4.3 10*3/uL (ref 1.7–7.7)
Neutrophils Relative %: 67 %
Platelets: 227 10*3/uL (ref 150–400)
RBC: 2.75 MIL/uL — ABNORMAL LOW (ref 4.22–5.81)
RDW: 13.3 % (ref 11.5–15.5)
WBC: 6.4 10*3/uL (ref 4.0–10.5)
nRBC: 0 % (ref 0.0–0.2)
nRBC: 1 /100 WBC — ABNORMAL HIGH

## 2020-12-02 LAB — PHOSPHORUS: Phosphorus: 5.9 mg/dL — ABNORMAL HIGH (ref 2.5–4.6)

## 2020-12-02 LAB — GLUCOSE, CAPILLARY
Glucose-Capillary: 122 mg/dL — ABNORMAL HIGH (ref 70–99)
Glucose-Capillary: 123 mg/dL — ABNORMAL HIGH (ref 70–99)
Glucose-Capillary: 132 mg/dL — ABNORMAL HIGH (ref 70–99)
Glucose-Capillary: 159 mg/dL — ABNORMAL HIGH (ref 70–99)
Glucose-Capillary: 162 mg/dL — ABNORMAL HIGH (ref 70–99)
Glucose-Capillary: 170 mg/dL — ABNORMAL HIGH (ref 70–99)

## 2020-12-02 LAB — MAGNESIUM: Magnesium: 1.5 mg/dL — ABNORMAL LOW (ref 1.7–2.4)

## 2020-12-02 MED ORDER — LISINOPRIL 20 MG PO TABS: 20.0000 mg | ORAL_TABLET | Freq: Every day | ORAL | Status: DC

## 2020-12-02 MED ORDER — ROPINIROLE HCL 1 MG PO TABS
0.5000 mg | ORAL_TABLET | ORAL | Status: DC
  Administered 2020-12-02 – 2020-12-06 (×10): 0.5 mg via ORAL
  Filled 2020-12-02 (×11): qty 1

## 2020-12-02 MED ORDER — METHOCARBAMOL 1000 MG/10ML IJ SOLN
500.0000 mg | Freq: Three times a day (TID) | INTRAVENOUS | Status: DC | PRN
  Administered 2020-12-02 – 2020-12-07 (×4): 500 mg via INTRAVENOUS
  Filled 2020-12-02 (×5): qty 5

## 2020-12-02 MED ORDER — PANTOPRAZOLE SODIUM 40 MG PO TBEC
40.0000 mg | DELAYED_RELEASE_TABLET | Freq: Every day | ORAL | Status: DC
  Administered 2020-12-02 – 2020-12-07 (×6): 40 mg via ORAL
  Filled 2020-12-02 (×6): qty 1

## 2020-12-02 MED ORDER — POTASSIUM CHLORIDE CRYS ER 20 MEQ PO TBCR
40.0000 meq | EXTENDED_RELEASE_TABLET | ORAL | Status: AC
  Administered 2020-12-02 (×2): 40 meq via ORAL
  Filled 2020-12-02 (×2): qty 2

## 2020-12-02 MED ORDER — MAGNESIUM SULFATE 2 GM/50ML IV SOLN
2.0000 g | Freq: Once | INTRAVENOUS | Status: AC
  Administered 2020-12-02: 2 g via INTRAVENOUS
  Filled 2020-12-02: qty 50

## 2020-12-02 MED ORDER — PAROXETINE HCL 20 MG PO TABS
20.0000 mg | ORAL_TABLET | Freq: Two times a day (BID) | ORAL | Status: DC
  Administered 2020-12-02 – 2020-12-07 (×11): 20 mg via ORAL
  Filled 2020-12-02 (×12): qty 1

## 2020-12-02 MED ORDER — SENNA 8.6 MG PO TABS
1.0000 | ORAL_TABLET | Freq: Two times a day (BID) | ORAL | Status: DC
  Administered 2020-12-02 – 2020-12-07 (×10): 8.6 mg via ORAL
  Filled 2020-12-02 (×11): qty 1

## 2020-12-02 MED ORDER — MAGNESIUM OXIDE -MG SUPPLEMENT 400 (240 MG) MG PO TABS
400.0000 mg | ORAL_TABLET | Freq: Two times a day (BID) | ORAL | Status: DC
  Administered 2020-12-02 – 2020-12-03 (×3): 400 mg via ORAL
  Filled 2020-12-02 (×3): qty 1

## 2020-12-02 MED ORDER — LISINOPRIL 20 MG PO TABS
20.0000 mg | ORAL_TABLET | Freq: Every day | ORAL | Status: DC
  Administered 2020-12-02: 20 mg via ORAL
  Filled 2020-12-02: qty 1

## 2020-12-02 NOTE — Progress Notes (Signed)
Patient ID: Danny Hampton, adult   DOB: 1956-03-05, 65 y.o.   MRN: [id number] BP (!) 136/118   Pulse 71   Temp 98.7 F (37.1 C) (Oral)   Resp 18   Ht 5\' 4"  (1.626 m)   Wt 96.4 kg   SpO2 98%   BMI 36.48 kg/m  Wound is clean, wound vac in place Moving lower extremities well May be transferred out of the unit if desired by Medicine

## 2020-12-02 NOTE — Progress Notes (Signed)
Informed by pharmacy that patient's home med supply is low. Asked patient to have family bring in more.

## 2020-12-02 NOTE — H&P (View-Only) (Signed)
Progress Note  Patient Name: Danny Hampton Date of Encounter: 12/02/2020  [facility] Cardiologist: Rozann Lesches, MD   Subjective   Still with NG tube in TEE rescheduled for Monday 10:00 Seen by behavioral health yesterday   Inpatient Medications    Scheduled Meds:  aspirin  81 mg Oral Daily   bisacodyl  10 mg Rectal Daily   budesonide (PULMICORT) nebulizer solution  0.25 mg Nebulization BID   Carbidopa-Levodopa ER  1 tablet Oral QID   carvedilol  25 mg Oral BID WC   Chlorhexidine Gluconate Cloth  6 each Topical Daily   diltiazem  90 mg Oral Q6H   docusate sodium  100 mg Oral BID   heparin  5,000 Units Subcutaneous Q8H   insulin aspart  0-9 Units Subcutaneous Q4H   lisinopril  20 mg Oral Daily   loratadine  10 mg Oral Daily   magnesium oxide  400 mg Oral BID   metoCLOPramide (REGLAN) injection  10 mg Intravenous Q12H   mupirocin ointment  1 application Nasal BID   pantoprazole (PROTONIX) IV  40 mg Intravenous Q24H   PARoxetine  20 mg Oral Daily   And   PARoxetine  20 mg Oral QHS   phenytoin (DILANTIN) IV  100 mg Intravenous BID   polyethylene glycol  17 g Per Tube Daily   potassium chloride  40 mEq Oral Q4H   rOPINIRole  0.5 mg Oral 2 times per day   senna  1 tablet Per Tube BID   sodium chloride flush  10-40 mL Intracatheter Q12H   sodium chloride flush  3 mL Intravenous Q12H   Continuous Infusions:  sodium chloride Stopped (12/02/20 0128)   magnesium sulfate bolus IVPB     methocarbamol (ROBAXIN) IV     nafcillin (NAFCIL) continuous infusion 20.4 mL/hr at 12/02/20 0800   phenytoin (DILANTIN) IV Stopped (12/02/20 0001)   PRN Meds: acetaminophen **OR** acetaminophen, albuterol, HYDROmorphone (DILAUDID) injection, hydrOXYzine, LORazepam, menthol-cetylpyridinium **OR** phenol, methocarbamol (ROBAXIN) IV, ondansetron **OR** ondansetron (ZOFRAN) IV, oxyCODONE, sodium chloride flush, sodium chloride flush, sodium phosphate   Vital Signs    Vitals:    12/02/20 0600 12/02/20 0700 12/02/20 0800 12/02/20 0820  BP: 118/76 132/76 (!) 136/118   Pulse: 86 77 91 74  Resp: 10 17 12 12   Temp:      TempSrc:      SpO2: 96% 94% 94%   Weight:      Height:        Intake/Output Summary (Last 24 hours) at 12/02/2020 0831 Last data filed at 12/02/2020 0800 Gross per 24 hour  Intake 687.37 ml  Output 960 ml  Net -272.63 ml   Last 3 Weights 12/02/2020 12/01/2020 11/30/2020  Weight (lbs) 212 lb 8.4 oz 213 lb 6.5 oz 207 lb 10.8 oz  Weight (kg) 96.4 kg 96.8 kg 94.2 kg      Telemetry    NSR - Personally Reviewed  ECG      Physical Exam   GEN: No acute distress.    Neck: No JVD Cardiac: RRR, no murmurs, rubs, or gallops.  Respiratory: Clear to auscultation bilaterally. GI: Soft, nontender, non-distended  MS: No edema; No deformity. Neuro:  Nonfocal  Psych: Normal affect   Labs    High Sensitivity Troponin:  No results for input(s): TROPONINIHS in the last 720 hours.   Chemistry Recent Labs  Lab 11/27/20 0851 11/28/20 0418 11/29/20 0436 12/01/20 0904 12/02/20 0430  NA  --  135 131* 134* 135  K  --  3.7 3.9 3.4* 2.9*  CL  --  97* 96* 98 97*  CO2  --  22 24 26 29   GLUCOSE  --  181* 133* 120* 125*  BUN  --  16 20 11  5*  CREATININE  --  1.08 0.92 0.93 0.74  CALCIUM  --  8.4* 8.3* 7.9* 7.8*  MG 1.9 1.9  --   --  1.5*  PROT  --  5.8* 5.3* 4.8* 4.6*  ALBUMIN  --  2.3* 2.1* 1.7* 1.6*  AST  --  296* 145* 25 21  ALT  --  103* 13 13 8   ALKPHOS  --  53 50 39 39  BILITOT  --  1.7* 1.9* 1.5* 1.1  GFRNONAA  --  >60 >60 >60 >60  ANIONGAP  --  16* 11 10 9     Lipids No results for input(s): CHOL, TRIG, HDL, LABVLDL, LDLCALC, CHOLHDL in the last 168 hours.  Hematology Recent Labs  Lab 11/30/20 0522 12/01/20 0904 12/02/20 0430  WBC 6.7 7.2 6.4  RBC 2.99* 2.78* 2.75*  HGB 9.5* 8.6* 8.6*  HCT 27.7* 26.0* 25.5*  MCV 92.6 93.5 92.7  MCH 31.8 30.9 31.3  MCHC 34.3 33.1 33.7  RDW 13.1 13.2 13.3  PLT 164 197 227   Thyroid  Recent  Labs  Lab 11/27/20 0851  TSH 3.489    BNP Recent Labs  Lab 11/27/20 0851  BNP 249.2*    DDimer No results for input(s): DDIMER in the last 168 hours.   Radiology    DG Abd 1 View  Result Date: 11/30/2020 CLINICAL DATA:  Ileus EXAM: ABDOMEN - 1 VIEW COMPARISON:  11/28/2020 FINDINGS: Persistent air distended large and small bowel loops throughout the abdomen, similar in appearance to the previous study. No gross free intraperitoneal air on portable supine view. Enteric tube tip terminates just beyond the level of the GE junction. Extensive spinal fusion hardware. IMPRESSION: 1. Persistent air distended large and small bowel loops throughout the abdomen, similar in appearance to the previous study and suggestive of ileus. 2. Enteric tube tip terminates just beyond the level of the GE junction. Electronically Signed   By: Davina Poke D.O.   On: 11/30/2020 15:28    Cardiac Studies     Patient Profile     65 y.o. adult with Staph bacteremia  Assessment & Plan    Atrial flutter converted to NSR.  On IV Amio which hopefully is short term.  CHADs vasc at least 2, but not currently a candidate for anticoagulation.   Planning for TEE due to bacteremia.  Not able to be done Friday Has been scheduled for Monday 10:00 Nurse indicates NG tube is now out      For questions or updates, please contact [facility] Please consult [email or web address] for contact info under        Signed, Jenkins Rouge, MD  12/02/2020, 8:31 AM   Patient ID: Danny Hampton, adult   DOB: 1955-11-04, 65 y.o.   MRN: [id number] Patient ID: Danny Hampton, adult   DOB: 12-02-1955, 65 y.o.   MRN: [id number]

## 2020-12-02 NOTE — Progress Notes (Signed)
Progress Note  Patient Name: Danny Hampton Date of Encounter: 12/02/2020  [facility] Cardiologist: Rozann Lesches, MD   Subjective   Still with NG tube in TEE rescheduled for Monday 10:00 Seen by behavioral health yesterday   Inpatient Medications    Scheduled Meds:  aspirin  81 mg Oral Daily   bisacodyl  10 mg Rectal Daily   budesonide (PULMICORT) nebulizer solution  0.25 mg Nebulization BID   Carbidopa-Levodopa ER  1 tablet Oral QID   carvedilol  25 mg Oral BID WC   Chlorhexidine Gluconate Cloth  6 each Topical Daily   diltiazem  90 mg Oral Q6H   docusate sodium  100 mg Oral BID   heparin  5,000 Units Subcutaneous Q8H   insulin aspart  0-9 Units Subcutaneous Q4H   lisinopril  20 mg Oral Daily   loratadine  10 mg Oral Daily   magnesium oxide  400 mg Oral BID   metoCLOPramide (REGLAN) injection  10 mg Intravenous Q12H   mupirocin ointment  1 application Nasal BID   pantoprazole (PROTONIX) IV  40 mg Intravenous Q24H   PARoxetine  20 mg Oral Daily   And   PARoxetine  20 mg Oral QHS   phenytoin (DILANTIN) IV  100 mg Intravenous BID   polyethylene glycol  17 g Per Tube Daily   potassium chloride  40 mEq Oral Q4H   rOPINIRole  0.5 mg Oral 2 times per day   senna  1 tablet Per Tube BID   sodium chloride flush  10-40 mL Intracatheter Q12H   sodium chloride flush  3 mL Intravenous Q12H   Continuous Infusions:  sodium chloride Stopped (12/02/20 0128)   magnesium sulfate bolus IVPB     methocarbamol (ROBAXIN) IV     nafcillin (NAFCIL) continuous infusion 20.4 mL/hr at 12/02/20 0800   phenytoin (DILANTIN) IV Stopped (12/02/20 0001)   PRN Meds: acetaminophen **OR** acetaminophen, albuterol, HYDROmorphone (DILAUDID) injection, hydrOXYzine, LORazepam, menthol-cetylpyridinium **OR** phenol, methocarbamol (ROBAXIN) IV, ondansetron **OR** ondansetron (ZOFRAN) IV, oxyCODONE, sodium chloride flush, sodium chloride flush, sodium phosphate   Vital Signs    Vitals:    12/02/20 0600 12/02/20 0700 12/02/20 0800 12/02/20 0820  BP: 118/76 132/76 (!) 136/118   Pulse: 86 77 91 74  Resp: 10 17 12 12   Temp:      TempSrc:      SpO2: 96% 94% 94%   Weight:      Height:        Intake/Output Summary (Last 24 hours) at 12/02/2020 0831 Last data filed at 12/02/2020 0800 Gross per 24 hour  Intake 687.37 ml  Output 960 ml  Net -272.63 ml   Last 3 Weights 12/02/2020 12/01/2020 11/30/2020  Weight (lbs) 212 lb 8.4 oz 213 lb 6.5 oz 207 lb 10.8 oz  Weight (kg) 96.4 kg 96.8 kg 94.2 kg      Telemetry    NSR - Personally Reviewed  ECG      Physical Exam   GEN: No acute distress.    Neck: No JVD Cardiac: RRR, no murmurs, rubs, or gallops.  Respiratory: Clear to auscultation bilaterally. GI: Soft, nontender, non-distended  MS: No edema; No deformity. Neuro:  Nonfocal  Psych: Normal affect   Labs    High Sensitivity Troponin:  No results for input(s): TROPONINIHS in the last 720 hours.   Chemistry Recent Labs  Lab 11/27/20 0851 11/28/20 0418 11/29/20 0436 12/01/20 0904 12/02/20 0430  NA  --  135 131* 134* 135  K  --  3.7 3.9 3.4* 2.9*  CL  --  97* 96* 98 97*  CO2  --  22 24 26 29   GLUCOSE  --  181* 133* 120* 125*  BUN  --  16 20 11  5*  CREATININE  --  1.08 0.92 0.93 0.74  CALCIUM  --  8.4* 8.3* 7.9* 7.8*  MG 1.9 1.9  --   --  1.5*  PROT  --  5.8* 5.3* 4.8* 4.6*  ALBUMIN  --  2.3* 2.1* 1.7* 1.6*  AST  --  296* 145* 25 21  ALT  --  103* 13 13 8   ALKPHOS  --  53 50 39 39  BILITOT  --  1.7* 1.9* 1.5* 1.1  GFRNONAA  --  >60 >60 >60 >60  ANIONGAP  --  16* 11 10 9     Lipids No results for input(s): CHOL, TRIG, HDL, LABVLDL, LDLCALC, CHOLHDL in the last 168 hours.  Hematology Recent Labs  Lab 11/30/20 0522 12/01/20 0904 12/02/20 0430  WBC 6.7 7.2 6.4  RBC 2.99* 2.78* 2.75*  HGB 9.5* 8.6* 8.6*  HCT 27.7* 26.0* 25.5*  MCV 92.6 93.5 92.7  MCH 31.8 30.9 31.3  MCHC 34.3 33.1 33.7  RDW 13.1 13.2 13.3  PLT 164 197 227   Thyroid  Recent  Labs  Lab 11/27/20 0851  TSH 3.489    BNP Recent Labs  Lab 11/27/20 0851  BNP 249.2*    DDimer No results for input(s): DDIMER in the last 168 hours.   Radiology    DG Abd 1 View  Result Date: 11/30/2020 CLINICAL DATA:  Ileus EXAM: ABDOMEN - 1 VIEW COMPARISON:  11/28/2020 FINDINGS: Persistent air distended large and small bowel loops throughout the abdomen, similar in appearance to the previous study. No gross free intraperitoneal air on portable supine view. Enteric tube tip terminates just beyond the level of the GE junction. Extensive spinal fusion hardware. IMPRESSION: 1. Persistent air distended large and small bowel loops throughout the abdomen, similar in appearance to the previous study and suggestive of ileus. 2. Enteric tube tip terminates just beyond the level of the GE junction. Electronically Signed   By: Davina Poke D.O.   On: 11/30/2020 15:28    Cardiac Studies     Patient Profile     65 y.o. adult with Staph bacteremia  Assessment & Plan    Atrial flutter converted to NSR.  On IV Amio which hopefully is short term.  CHADs vasc at least 2, but not currently a candidate for anticoagulation.   Planning for TEE due to bacteremia.  Not able to be done Friday Has been scheduled for Monday 10:00 Nurse indicates NG tube is now out      For questions or updates, please contact [facility] Please consult [email or web address] for contact info under        Signed, Jenkins Rouge, MD  12/02/2020, 8:31 AM   Patient ID: Danny Hampton, adult   DOB: 11-Jan-1956, 65 y.o.   MRN: [id number] Patient ID: Danny Hampton, adult   DOB: Jan 12, 1956, 65 y.o.   MRN: [id number]

## 2020-12-02 NOTE — Progress Notes (Addendum)
PROGRESS NOTE    Danny Hampton  VCB:[id number] DOB: 1955/04/18 DOA: 11/23/2020 PCP: [facility] [place]   Chief Complaint  Patient presents with   Constipation   Brief Narrative:  65 yo with hx GERD, dysrhythmia, HTN, HLD, myotonia congenita, T2DM, and recent lumbar decompression and extension of posterior spinal instrumented fusion (discharged 8/27) who presented to [facility] on 9/17 with intractable abdominal pain and was found to have an ileus.  He was transferred to [facility] for neurosurgery evaluation and management in the setting of MSSA bacteremia and post operative wound infection.  He's now s/p debridement of postoperative wound infection on 9/20 and he was transferred to the ICU on this date.  Hospitalization was complicated by atrial fibrillation with RVR.    Transferred to hospitalist 9/24.  Plan is for TEE Monday, 9/26.  Continue nafcillin.  NG in place, currently clamped.    ID, cardiology, GI are following.  Surgery and PCCM have signed off.  Assessment & Plan:   Principal Problem:   Staphylococcus aureus bacteremia Active Problems:   Acute respiratory failure with hypoxia (HCC)   GERD (gastroesophageal reflux disease)   Myotonia congenita   Hyperlipidemia   Intractable abdominal pain   Abdominal distension   Generalized abdominal pain   Postoperative wound infection   Ileus, postoperative (HCC)   Acute exacerbation of chronic low back pain   Ileus (HCC)   Abnormal transaminases   Demoralization  MSSA Bacteremia  Postoperative Lumbar Wound Infection S/p debridement of postop lumbar wound infection 9/20 9/17 cx show MSSA bacteremia 9/18 wound cx and 9/20 soft tissue abscess with MSSA 9//18 blood cx no growth Echo 9/18 with no RWMA, mildly reduced RVSF - no evidence of valvular vegetations Plan for TEE on 9/26 ID c/s, appreciate recs - planning for nafcillin x2 weeks, then cefazolin to conclude 6 weeks, followed by prolonged course of oral abx Wound  vac in place, per NSGY, appreciate recommendations  Ileus NG removed ADAT to fulls from clears KUB 9/23 with persistent air distended large and small bowel loops throughout abdomen Appreciate GI recommendations - continue reglan, minimize narcotics - now signed off, no need for outpatient GI follow up  Atrial Fibrillation with RVR Diltiazem 90 q6, coreg 25 mg BID Cardiology c/s, appreciate recs Not currently candidate for anticoagulation  Hypertension Hypotension yesterday, BP better today Lisinopril cut in half with hypotension yesterday, coreg, dilt HCTZ on hold  HLD Continue lipitor Continue aspirin  Myotonia Congenita He notes sinemet and dilantin for this  RLS ropinirole  Hypokalemia  Hypomagnesemia Replace, follow  Suicidal Statement Psychiatry c/s, appreciate recommendations Continue paxil, ativan prn hydroxyzine  DVT prophylaxis: heparin Code Status: full Family Communication: none at bedside Disposition:   Status is: Inpatient  Remains inpatient appropriate because:Inpatient level of care appropriate due to severity of illness  Dispo: The patient is from:  home              Anticipated d/c is to:  pending              Patient currently is not medically stable to d/c.   Difficult to place patient No       Consultants:  Surgery IG Danny Hampton Cardiology PCCM ID GI  Procedures:  Debridement of postoperative lumbar wound infection 9/20  Echo IMPRESSIONS     1. Left ventricular ejection fraction, by estimation, is 60 to 65%. The  left ventricle has normal function. The left ventricle has no regional  wall motion abnormalities. There  is mild concentric left ventricular  hypertrophy. Left ventricular diastolic  parameters were normal.   2. Right ventricular systolic function is mildly reduced. The right  ventricular size is moderately enlarged. Tricuspid regurgitation signal is  inadequate for assessing PA pressure.   3. The mitral valve is  grossly normal. Trivial mitral valve  regurgitation. No evidence of mitral stenosis.   4. The aortic valve is tricuspid. Aortic valve regurgitation is not  visualized. No aortic stenosis is present.   5. The inferior vena cava is normal in size with <50% respiratory  variability, suggesting right atrial pressure of 8 mmHg.   Comparison(s): Changes from prior study are noted. LV function remains  unchanged. RV appears moderately dilated with mildly reduced function  which is new from prior study.   Conclusion(s)/Recommendation(s): No evidence of valvular vegetations on  this transthoracic echocardiogram. Would recommend Thayer Danny Hampton transesophageal  echocardiogram to exclude infective endocarditis if clinically indicated. Antimicrobials:  Anti-infectives (From admission, onward)    Start     Dose/Rate Route Frequency Ordered Stop   11/27/20 2145  ceFAZolin (ANCEF) IVPB 2g/100 mL premix        2 g 200 mL/hr over 30 Minutes Intravenous Every 8 hours 11/27/20 2052 11/28/20 0619   11/27/20 1200  nafcillin 12 g in sodium chloride 0.9 % 500 mL continuous infusion        12 g 20.8 mL/hr over 24 Hours Intravenous Every 24 hours 11/27/20 1007     11/25/20 1400  ceFAZolin (ANCEF) IVPB 2g/100 mL premix  Status:  Discontinued        2 g 200 mL/hr over 30 Minutes Intravenous Every 8 hours 11/25/20 0915 11/27/20 1007   11/24/20 1400  piperacillin-tazobactam (ZOSYN) IVPB 3.375 g  Status:  Discontinued       See Hyperspace for full Linked Orders Report.   3.375 g 12.5 mL/hr over 240 Minutes Intravenous Every 8 hours 11/24/20 0755 11/25/20 0915   11/24/20 0800  piperacillin-tazobactam (ZOSYN) IVPB 3.375 g       See Hyperspace for full Linked Orders Report.   3.375 g 100 mL/hr over 30 Minutes Intravenous  Once 11/24/20 0755 11/24/20 0925       Subjective:  No new complaints today  Objective: Vitals:   12/02/20 0600 12/02/20 0700 12/02/20 0800 12/02/20 0820  BP: 118/76 132/76 (!) 136/118   Pulse: 86  77 91 74  Resp: 10 17 12 12   Temp:      TempSrc:      SpO2: 96% 94% 94%   Weight:      Height:        Intake/Output Summary (Last 24 hours) at 12/02/2020 0849 Last data filed at 12/02/2020 0800 Gross per 24 hour  Intake 687.37 ml  Output 960 ml  Net -272.63 ml   Filed Weights   11/30/20 0500 12/01/20 0500 12/02/20 0500  Weight: 94.2 kg 96.8 kg 96.4 kg    Examination:  General: No acute distress. Cardiovascular: RRR Lungs: unlabored Abdomen: Soft, but distended - nontender Neurological: Alert and oriented 3. Moves all extremities 4. Cranial nerves II through XII grossly intact. Skin: back not examined today Extremities: No clubbing or cyanosis. No edema.      Data Reviewed: I have personally reviewed following labs and imaging studies  CBC: Recent Labs  Lab 11/28/20 0418 11/29/20 0436 11/30/20 0522 12/01/20 0904 12/02/20 0430  WBC 5.8 7.5 6.7 7.2 6.4  NEUTROABS 4.3 5.2 4.5 4.8 4.3  HGB 9.6* 9.5* 9.5* 8.6* 8.6*  HCT 28.4* 27.8* 27.7* 26.0* 25.5*  MCV 92.2 91.1 92.6 93.5 92.7  PLT 97* 133* 164 197 974    Basic Metabolic Panel: Recent Labs  Lab 11/27/20 0348 11/27/20 0851 11/28/20 0418 11/29/20 0436 12/01/20 0904 12/02/20 0430  NA 130*  --  135 131* 134* 135  K 4.4  --  3.7 3.9 3.4* 2.9*  CL 95*  --  97* 96* 98 97*  CO2 21*  --  22 24 26 29   GLUCOSE 128*  --  181* 133* 120* 125*  BUN 23  --  16 20 11  5*  CREATININE 1.05  --  1.08 0.92 0.93 0.74  CALCIUM 8.6*  --  8.4* 8.3* 7.9* 7.8*  MG  --  1.9 1.9  --   --  1.5*  PHOS  --  2.4* 2.5  --   --  5.9*    GFR: Estimated Creatinine Clearance (by C-G formula based on SCr of 0.74 mg/dL) Male: 79 mL/min Male: 96.5 mL/min  Liver Function Tests: Recent Labs  Lab 11/27/20 0348 11/28/20 0418 11/29/20 0436 12/01/20 0904 12/02/20 0430  AST 393* 296* 145* 25 21  ALT 77* 103* 13 13 8   ALKPHOS 50 53 50 39 39  BILITOT 1.0 1.7* 1.9* 1.5* 1.1  PROT 5.9* 5.8* 5.3* 4.8* 4.6*  ALBUMIN 2.5* 2.3* 2.1*  1.7* 1.6*    CBG: Recent Labs  Lab 12/01/20 1524 12/01/20 1914 12/01/20 2324 12/02/20 0349 12/02/20 0752  GLUCAP 100* 208* 114* 122* 159*     Recent Results (from the past 240 hour(s))  Resp Panel by RT-PCR (Flu Oakes Mccready&B, Covid) Nasopharyngeal Swab     Status: None   Collection Time: 11/24/20 12:40 AM   Specimen: Nasopharyngeal Swab; Nasopharyngeal(NP) swabs in vial transport medium  Result Value Ref Range Status   SARS Coronavirus 2 by RT PCR NEGATIVE NEGATIVE Final    Comment: (NOTE) SARS-CoV-2 target nucleic acids are NOT DETECTED.  The SARS-CoV-2 RNA is generally detectable in upper respiratory specimens during the acute phase of infection. The lowest concentration of SARS-CoV-2 viral copies this assay can detect is 138 copies/mL. Karina Nofsinger negative result does not preclude SARS-Cov-2 infection and should not be used as the sole basis for treatment or other patient management decisions. Skyelar Halliday negative result may occur with  improper specimen collection/handling, submission of specimen other than nasopharyngeal swab, presence of viral mutation(s) within the areas targeted by this assay, and inadequate number of viral copies(<138 copies/mL). Jakavion Bilodeau negative result must be combined with clinical observations, patient history, and epidemiological information. The expected result is Negative.  Fact Sheet for Patients:  EntrepreneurPulse.com.au  Fact Sheet for Healthcare Providers:  IncredibleEmployment.be  This test is no t yet approved or cleared by the [place] FDA and  has been authorized for detection and/or diagnosis of SARS-CoV-2 by FDA under an Emergency Use Authorization (EUA). This EUA will remain  in effect (meaning this test can be used) for the duration of the COVID-19 declaration under Section 564(b)(1) of the Act, 21 U.S.C.section 360bbb-3(b)(1), unless the authorization is terminated  or revoked sooner.       Influenza Lorel Lembo by PCR  NEGATIVE NEGATIVE Final   Influenza B by PCR NEGATIVE NEGATIVE Final    Comment: (NOTE) The Xpert Xpress SARS-CoV-2/FLU/RSV plus assay is intended as an aid in the diagnosis of influenza from Nasopharyngeal swab specimens and should not be used as Donatella Walski sole basis for treatment. Nasal washings and aspirates are unacceptable for Xpert Xpress SARS-CoV-2/FLU/RSV testing.  Fact  Sheet for Patients: EntrepreneurPulse.com.au  Fact Sheet for Healthcare Providers: IncredibleEmployment.be  This test is not yet approved or cleared by the [place] FDA and has been authorized for detection and/or diagnosis of SARS-CoV-2 by FDA under an Emergency Use Authorization (EUA). This EUA will remain in effect (meaning this test can be used) for the duration of the COVID-19 declaration under Section 564(b)(1) of the Act, 21 U.S.C. section 360bbb-3(b)(1), unless the authorization is terminated or revoked.  Performed at [facility], [address] [place], [place] [address]   Culture, blood (routine x 2)     Status: Abnormal   Collection Time: 11/24/20  8:15 AM   Specimen: BLOOD LEFT HAND  Result Value Ref Range Status   Specimen Description   Final    BLOOD LEFT HAND BOTTLES DRAWN AEROBIC AND ANAEROBIC Performed at [facility], [address] [place], [place] [address]    Special Requests   Final    Blood Culture adequate volume Performed at [facility], [address], [place], [place] [address]    Culture  Setup Time   Final    GRAM POSITIVE COCCI IN BOTH AEROBIC AND ANAEROBIC BOTTLES CRITICAL RESULT CALLED TO, READ BACK BY AND VERIFIED WITH: PHARM D L.POOLE ON [id number] AT 0818 BY E.PARRISH Performed at [facility], [place] [address], [place], [place] [address]    Culture STAPHYLOCOCCUS AUREUS (Doak Mah)  Final   Report Status 11/27/2020 FINAL  Final   Organism ID, Bacteria STAPHYLOCOCCUS AUREUS  Final      Susceptibility   Staphylococcus aureus - MIC*     CIPROFLOXACIN <=0.5 SENSITIVE Sensitive     ERYTHROMYCIN <=0.25 SENSITIVE Sensitive     GENTAMICIN <=0.5 SENSITIVE Sensitive     OXACILLIN <=0.25 SENSITIVE Sensitive     TETRACYCLINE <=1 SENSITIVE Sensitive     VANCOMYCIN 1 SENSITIVE Sensitive     TRIMETH/SULFA <=10 SENSITIVE Sensitive     CLINDAMYCIN <=0.25 SENSITIVE Sensitive     RIFAMPIN <=0.5 SENSITIVE Sensitive     Inducible Clindamycin NEGATIVE Sensitive     * STAPHYLOCOCCUS AUREUS  Blood Culture ID Panel (Reflexed)     Status: Abnormal   Collection Time: 11/24/20  8:15 AM  Result Value Ref Range Status   Enterococcus faecalis NOT DETECTED NOT DETECTED Final   Enterococcus Faecium NOT DETECTED NOT DETECTED Final   Listeria monocytogenes NOT DETECTED NOT DETECTED Final   Staphylococcus species DETECTED ([place] Churchill) NOT DETECTED Final    Comment: CRITICAL RESULT CALLED TO, READ BACK BY AND VERIFIED WITH: PHARM D L.POOLE ON [id number] AT 0818 BY E.PARRISH    Staphylococcus aureus (BCID) DETECTED (Reyonna Haack) NOT DETECTED Final    Comment: CRITICAL RESULT CALLED TO, READ BACK BY AND VERIFIED WITH: PHARM D L.POOLE ON [id number] AT 0818 BY E.PARRISH    Staphylococcus epidermidis NOT DETECTED NOT DETECTED Final   Staphylococcus lugdunensis NOT DETECTED NOT DETECTED Final   Streptococcus species NOT DETECTED NOT DETECTED Final   Streptococcus agalactiae NOT DETECTED NOT DETECTED Final   Streptococcus pneumoniae NOT DETECTED NOT DETECTED Final   Streptococcus pyogenes NOT DETECTED NOT DETECTED Final   Loukas Antonson.calcoaceticus-baumannii NOT DETECTED NOT DETECTED Final   Bacteroides fragilis NOT DETECTED NOT DETECTED Final   Enterobacterales NOT DETECTED NOT DETECTED Final   Enterobacter cloacae complex NOT DETECTED NOT DETECTED Final   Escherichia coli NOT DETECTED NOT DETECTED Final   Klebsiella aerogenes NOT DETECTED NOT DETECTED Final   Klebsiella oxytoca NOT DETECTED NOT DETECTED Final   Klebsiella pneumoniae NOT DETECTED  NOT DETECTED Final   Proteus species  NOT DETECTED NOT DETECTED Final   Salmonella species NOT DETECTED NOT DETECTED Final   Serratia marcescens NOT DETECTED NOT DETECTED Final   Haemophilus influenzae NOT DETECTED NOT DETECTED Final   Neisseria meningitidis NOT DETECTED NOT DETECTED Final   Pseudomonas aeruginosa NOT DETECTED NOT DETECTED Final   Stenotrophomonas maltophilia NOT DETECTED NOT DETECTED Final   Candida albicans NOT DETECTED NOT DETECTED Final   Candida auris NOT DETECTED NOT DETECTED Final   Candida glabrata NOT DETECTED NOT DETECTED Final   Candida krusei NOT DETECTED NOT DETECTED Final   Candida parapsilosis NOT DETECTED NOT DETECTED Final   Candida tropicalis NOT DETECTED NOT DETECTED Final   Cryptococcus neoformans/gattii NOT DETECTED NOT DETECTED Final   Meth resistant mecA/C and MREJ NOT DETECTED NOT DETECTED Final    Comment: Performed at [facility], [place] [address], [place], [place] [address]  Culture, blood (routine x 2)     Status: Abnormal   Collection Time: 11/24/20  8:16 AM   Specimen: BLOOD RIGHT HAND  Result Value Ref Range Status   Specimen Description   Final    BLOOD RIGHT HAND BOTTLES DRAWN AEROBIC AND ANAEROBIC Performed at [facility], [address] [place], [place] [address]    Special Requests   Final    Blood Culture adequate volume Performed at [facility], [address], [place], [place] [address]    Culture  Setup Time   Final    GRAM POSITIVE COCCI IN BOTH AEROBIC AND ANAEROBIC BOTTLES CRITICAL VALUE NOTED.  VALUE IS CONSISTENT WITH PREVIOUSLY REPORTED AND CALLED VALUE.    Culture (Trask Vosler)  Final    STAPHYLOCOCCUS AUREUS SUSCEPTIBILITIES PERFORMED ON PREVIOUS CULTURE WITHIN THE LAST 5 DAYS. Performed at [facility], [place] [address], [place], [place] [address]    Report Status 11/27/2020 FINAL  Final  MRSA Next Gen by PCR, Nasal     Status: None   Collection Time: 11/24/20  3:30 PM   Specimen: Nasal Mucosa; Nasal Swab  Result Value Ref Range Status   MRSA by  PCR Next Gen NOT DETECTED NOT DETECTED Final    Comment: (NOTE) The GeneXpert MRSA Assay (FDA approved for NASAL specimens only), is one component of Jaben Benegas comprehensive MRSA colonization surveillance program. It is not intended to diagnose MRSA infection nor to guide or monitor treatment for MRSA infections. Test performance is not FDA approved in patients less than 78 years old. Performed at [facility], [address], [place], [place] [address]   Aerobic Culture w Gram Stain (superficial specimen)     Status: None   Collection Time: 11/25/20 11:00 AM   Specimen: Back; Wound  Result Value Ref Range Status   Specimen Description   Final    BACK Performed at [facility], [address], [place], [place] [address]    Special Requests   Final    Normal Performed at [facility], [address], [place], [place] [address]    Gram Stain   Final    NO WBC SEEN FEW GRAM POSITIVE COCCI Performed at [facility], [place] [address], [place], [place] [address]    Culture ABUNDANT STAPHYLOCOCCUS AUREUS  Final   Report Status 11/29/2020 FINAL  Final   Organism ID, Bacteria STAPHYLOCOCCUS AUREUS  Final      Susceptibility   Staphylococcus aureus - MIC*    CIPROFLOXACIN <=0.5 SENSITIVE Sensitive     ERYTHROMYCIN <=0.25 SENSITIVE Sensitive  GENTAMICIN <=0.5 SENSITIVE Sensitive     OXACILLIN <=0.25 SENSITIVE Sensitive     TETRACYCLINE <=1 SENSITIVE Sensitive     VANCOMYCIN 1 SENSITIVE Sensitive     TRIMETH/SULFA <=10 SENSITIVE Sensitive     CLINDAMYCIN <=0.25 SENSITIVE Sensitive     RIFAMPIN <=0.5 SENSITIVE Sensitive     Inducible Clindamycin NEGATIVE Sensitive     * ABUNDANT STAPHYLOCOCCUS AUREUS  Culture, blood (routine x 2)     Status: None   Collection Time: 11/25/20  3:42 PM   Specimen: BLOOD LEFT FOREARM  Result Value Ref Range Status   Specimen Description   Final    BLOOD LEFT FOREARM BOTTLES DRAWN AEROBIC AND ANAEROBIC   Special Requests Blood Culture adequate volume  Final    Culture   Final    NO GROWTH 5 DAYS Performed at [facility], [address], [place], [place] [address]    Report Status 11/30/2020 FINAL  Final  Culture, blood (routine x 2)     Status: None   Collection Time: 11/25/20  3:55 PM   Specimen: Right Antecubital; Blood  Result Value Ref Range Status   Specimen Description   Final    RIGHT ANTECUBITAL BOTTLES DRAWN AEROBIC AND ANAEROBIC   Special Requests Blood Culture adequate volume  Final   Culture   Final    NO GROWTH 5 DAYS Performed at [facility], [address], [place], [place] [address]    Report Status 11/30/2020 FINAL  Final  Aerobic Culture w Gram Stain (superficial specimen)     Status: None   Collection Time: 11/27/20  1:41 PM   Specimen: Wound  Result Value Ref Range Status   Specimen Description WOUND  Final   Special Requests SOFT TISSUE ABCESS  Final   Gram Stain   Final    ABUNDANT WBC PRESENT,BOTH PMN AND MONONUCLEAR RARE GRAM POSITIVE COCCI Performed at [facility], [place] [address], [place], [place] [address]    Culture FEW STAPHYLOCOCCUS AUREUS  Final   Report Status 11/29/2020 FINAL  Final   Organism ID, Bacteria STAPHYLOCOCCUS AUREUS  Final      Susceptibility   Staphylococcus aureus - MIC*    CIPROFLOXACIN <=0.5 SENSITIVE Sensitive     ERYTHROMYCIN <=0.25 SENSITIVE Sensitive     GENTAMICIN <=0.5 SENSITIVE Sensitive     OXACILLIN <=0.25 SENSITIVE Sensitive     TETRACYCLINE <=1 SENSITIVE Sensitive     VANCOMYCIN 1 SENSITIVE Sensitive     TRIMETH/SULFA <=10 SENSITIVE Sensitive     CLINDAMYCIN <=0.25 SENSITIVE Sensitive     RIFAMPIN <=0.5 SENSITIVE Sensitive     Inducible Clindamycin NEGATIVE Sensitive     * FEW STAPHYLOCOCCUS AUREUS  Surgical PCR screen     Status: Abnormal   Collection Time: 11/27/20  1:58 PM   Specimen: Nasal Mucosa; Nasal Swab  Result Value Ref Range Status   MRSA, PCR NEGATIVE NEGATIVE Final   Staphylococcus aureus POSITIVE (Priyah Schmuck) NEGATIVE Final    Comment: (NOTE) The  Xpert SA Assay (FDA approved for NASAL specimens in patients 1 years of age and older), is one component of Torian Thoennes comprehensive surveillance program. It is not intended to diagnose infection nor to guide or monitor treatment. Performed at [facility], [place] [address], [place],  [address]          Radiology Studies: DG Abd 1 View  Result Date: 11/30/2020 CLINICAL DATA:  Ileus EXAM: ABDOMEN - 1 VIEW COMPARISON:  11/28/2020 FINDINGS: Persistent air distended large and  small bowel loops throughout the abdomen, similar in appearance to the previous study. No gross free intraperitoneal air on portable supine view. Enteric tube tip terminates just beyond the level of the GE junction. Extensive spinal fusion hardware. IMPRESSION: 1. Persistent air distended large and small bowel loops throughout the abdomen, similar in appearance to the previous study and suggestive of ileus. 2. Enteric tube tip terminates just beyond the level of the GE junction. Electronically Signed   By: Davina Poke D.O.   On: 11/30/2020 15:28        Scheduled Meds:  aspirin  81 mg Oral Daily   bisacodyl  10 mg Rectal Daily   budesonide (PULMICORT) nebulizer solution  0.25 mg Nebulization BID   Carbidopa-Levodopa ER  1 tablet Oral QID   carvedilol  25 mg Oral BID WC   Chlorhexidine Gluconate Cloth  6 each Topical Daily   diltiazem  90 mg Oral Q6H   docusate sodium  100 mg Oral BID   heparin  5,000 Units Subcutaneous Q8H   insulin aspart  0-9 Units Subcutaneous Q4H   lisinopril  20 mg Oral Daily   loratadine  10 mg Oral Daily   magnesium oxide  400 mg Oral BID   metoCLOPramide (REGLAN) injection  10 mg Intravenous Q12H   mupirocin ointment  1 application Nasal BID   pantoprazole  40 mg Oral Q1200   PARoxetine  20 mg Oral BID   phenytoin (DILANTIN) IV  100 mg Intravenous BID   polyethylene glycol  17 g Per Tube Daily   potassium chloride  40 mEq Oral Q4H   rOPINIRole  0.5 mg Oral 2 times per day    senna  1 tablet Oral BID   sodium chloride flush  10-40 mL Intracatheter Q12H   sodium chloride flush  3 mL Intravenous Q12H   Continuous Infusions:  sodium chloride Stopped (12/02/20 0128)   magnesium sulfate bolus IVPB     methocarbamol (ROBAXIN) IV     nafcillin (NAFCIL) continuous infusion 20.4 mL/hr at 12/02/20 0800   phenytoin (DILANTIN) IV Stopped (12/02/20 0001)     LOS: 8 days    Time spent: over 30 min    Fayrene Helper, MD [facility]   To contact the attending provider between 7A-7P or the covering provider during after hours 7P-7A, please log into the web site [email or web address] and access using universal [place] password for that web site. If you do not have the password, please call the hospital operator.  12/02/2020, 8:49 AM

## 2020-12-02 NOTE — Progress Notes (Signed)
Patient's family unable to visit until tomorrow. Therefore, his home med will not be brought in until then. Patient states "I'll be okay without it."  Informed pharmacy.

## 2020-12-03 ENCOUNTER — Encounter (HOSPITAL_COMMUNITY)
Admission: EM | Disposition: A | Discharge status: Skilled Nursing Facility | Payer: Self-pay | Source: Home / Self Care | Attending: Family Medicine

## 2020-12-03 ENCOUNTER — Inpatient Hospital Stay (HOSPITAL_COMMUNITY): Payer: No Typology Code available for payment source | Admitting: Certified Registered"

## 2020-12-03 ENCOUNTER — Inpatient Hospital Stay (HOSPITAL_COMMUNITY): Payer: No Typology Code available for payment source

## 2020-12-03 DIAGNOSIS — I10 Essential (primary) hypertension: Secondary | ICD-10-CM

## 2020-12-03 DIAGNOSIS — B9561 Methicillin susceptible Staphylococcus aureus infection as the cause of diseases classified elsewhere: Secondary | ICD-10-CM | POA: Diagnosis not present

## 2020-12-03 DIAGNOSIS — R7881 Bacteremia: Secondary | ICD-10-CM | POA: Diagnosis not present

## 2020-12-03 HISTORY — PX: TEE WITHOUT CARDIOVERSION: SHX5443

## 2020-12-03 LAB — CBC WITH DIFFERENTIAL/PLATELET
Abs Immature Granulocytes: 0.1 10*3/uL — ABNORMAL HIGH (ref 0.00–0.07)
Basophils Absolute: 0.1 10*3/uL (ref 0.0–0.1)
Basophils Relative: 2 %
Eosinophils Absolute: 0.1 10*3/uL (ref 0.0–0.5)
Eosinophils Relative: 1 %
HCT: 26.2 % — ABNORMAL LOW (ref 39.0–52.0)
Hemoglobin: 8.7 g/dL — ABNORMAL LOW (ref 13.0–17.0)
Lymphocytes Relative: 4 %
Lymphs Abs: 0.2 10*3/uL — ABNORMAL LOW (ref 0.7–4.0)
MCH: 31.3 pg (ref 26.0–34.0)
MCHC: 33.2 g/dL (ref 30.0–36.0)
MCV: 94.2 fL (ref 80.0–100.0)
Metamyelocytes Relative: 1 %
Monocytes Absolute: 0.1 10*3/uL (ref 0.1–1.0)
Monocytes Relative: 2 %
Neutro Abs: 5.6 10*3/uL (ref 1.7–7.7)
Neutrophils Relative %: 90 %
Platelets: 248 10*3/uL (ref 150–400)
RBC: 2.78 MIL/uL — ABNORMAL LOW (ref 4.22–5.81)
RDW: 13.5 % (ref 11.5–15.5)
WBC: 6.2 10*3/uL (ref 4.0–10.5)
nRBC: 0 % (ref 0.0–0.2)
nRBC: 1 /100 WBC — ABNORMAL HIGH

## 2020-12-03 LAB — COMPREHENSIVE METABOLIC PANEL
ALT: 24 U/L (ref 0–44)
AST: 21 U/L (ref 15–41)
Albumin: 1.7 g/dL — ABNORMAL LOW (ref 3.5–5.0)
Alkaline Phosphatase: 41 U/L (ref 38–126)
Anion gap: 7 (ref 5–15)
BUN: <5 mg/dL — ABNORMAL LOW (ref 8–23)
CO2: 34 mmol/L — ABNORMAL HIGH (ref 22–32)
Calcium: 7.9 mg/dL — ABNORMAL LOW (ref 8.9–10.3)
Chloride: 97 mmol/L — ABNORMAL LOW (ref 98–111)
Creatinine, Ser: 0.68 mg/dL (ref 0.61–1.24)
GFR, Estimated: >60 mL/min (ref >60)
Glucose, Bld: 136 mg/dL — ABNORMAL HIGH (ref 70–99)
Potassium: 3.4 mmol/L — ABNORMAL LOW (ref 3.5–5.1)
Sodium: 138 mmol/L (ref 135–145)
Total Bilirubin: 1.3 mg/dL — ABNORMAL HIGH (ref 0.3–1.2)
Total Protein: 4.7 g/dL — ABNORMAL LOW (ref 6.5–8.1)

## 2020-12-03 LAB — SURGICAL PCR SCREEN
MRSA, PCR: NEGATIVE
Staphylococcus aureus: NEGATIVE

## 2020-12-03 LAB — GLUCOSE, CAPILLARY
Glucose-Capillary: 128 mg/dL — ABNORMAL HIGH (ref 70–99)
Glucose-Capillary: 115 mg/dL — ABNORMAL HIGH (ref 70–99)
Glucose-Capillary: 121 mg/dL — ABNORMAL HIGH (ref 70–99)
Glucose-Capillary: 138 mg/dL — ABNORMAL HIGH (ref 70–99)
Glucose-Capillary: 165 mg/dL — ABNORMAL HIGH (ref 70–99)
Glucose-Capillary: 111 mg/dL — ABNORMAL HIGH (ref 70–99)

## 2020-12-03 LAB — MAGNESIUM: Magnesium: 1.8 mg/dL (ref 1.7–2.4)

## 2020-12-03 LAB — PHOSPHORUS: Phosphorus: 3.3 mg/dL (ref 2.5–4.6)

## 2020-12-03 SURGERY — ECHOCARDIOGRAM, TRANSESOPHAGEAL
Anesthesia: Monitor Anesthesia Care

## 2020-12-03 MED ORDER — SODIUM CHLORIDE 0.9 % IV SOLN
INTRAVENOUS | Status: DC | PRN
  Administered 2020-12-03: 250 mL via INTRAVENOUS

## 2020-12-03 MED ORDER — PROPOFOL 500 MG/50ML IV EMUL
INTRAVENOUS | Status: DC | PRN
  Administered 2020-12-03: 100 ug/kg/min via INTRAVENOUS

## 2020-12-03 MED ORDER — MAGNESIUM OXIDE -MG SUPPLEMENT 400 (240 MG) MG PO TABS
400.0000 mg | ORAL_TABLET | Freq: Two times a day (BID) | ORAL | Status: DC
  Administered 2020-12-04 – 2020-12-07 (×7): 400 mg via ORAL
  Filled 2020-12-03 (×7): qty 1

## 2020-12-03 MED ORDER — ACETAMINOPHEN 500 MG PO TABS
1000.0000 mg | ORAL_TABLET | Freq: Three times a day (TID) | ORAL | Status: AC
  Administered 2020-12-03 – 2020-12-06 (×9): 1000 mg via ORAL
  Filled 2020-12-03 (×9): qty 2

## 2020-12-03 MED ORDER — DILTIAZEM HCL ER COATED BEADS 240 MG PO CP24
240.0000 mg | ORAL_CAPSULE | Freq: Every day | ORAL | Status: DC
  Administered 2020-12-04 – 2020-12-07 (×4): 240 mg via ORAL
  Filled 2020-12-03 (×3): qty 2
  Filled 2020-12-03 (×4): qty 1

## 2020-12-03 MED ORDER — MAGNESIUM SULFATE 2 GM/50ML IV SOLN
2.0000 g | Freq: Once | INTRAVENOUS | Status: AC
  Administered 2020-12-03: 2 g via INTRAVENOUS
  Filled 2020-12-03: qty 50

## 2020-12-03 MED ORDER — CARBIDOPA-LEVODOPA ER 48.75-195 MG PO CPCR
1.0000 | ORAL_CAPSULE | Freq: Four times a day (QID) | ORAL | Status: DC
  Administered 2020-12-04 – 2020-12-07 (×13): 1 via ORAL
  Filled 2020-12-03 (×14): qty 1

## 2020-12-03 MED ORDER — ACETAMINOPHEN 325 MG PO TABS: 650.0000 mg | ORAL_TABLET | Freq: Four times a day (QID) | ORAL | Status: DC | PRN

## 2020-12-03 MED ORDER — POTASSIUM CHLORIDE CRYS ER 20 MEQ PO TBCR
40.0000 meq | EXTENDED_RELEASE_TABLET | Freq: Once | ORAL | Status: AC
  Administered 2020-12-03: 40 meq via ORAL
  Filled 2020-12-03: qty 2

## 2020-12-03 MED ORDER — SODIUM CHLORIDE 0.9 % IV SOLN: INTRAVENOUS | Status: DC

## 2020-12-03 MED ORDER — CARBIDOPA-LEVODOPA 25-100 MG PO TABS
2.0000 | ORAL_TABLET | Freq: Four times a day (QID) | ORAL | Status: AC
  Administered 2020-12-03 (×3): 2 via ORAL
  Filled 2020-12-03 (×3): qty 2

## 2020-12-03 MED ORDER — LACTATED RINGERS IV SOLN
INTRAVENOUS | Status: AC | PRN
  Administered 2020-12-03: 1000 mL via INTRAVENOUS

## 2020-12-03 NOTE — Progress Notes (Signed)
  Echocardiogram TEE has been performed.  Danny Hampton 12/03/2020, 12:26 PM

## 2020-12-03 NOTE — Progress Notes (Signed)
[facility] for Infectious Disease    Date of Admission:  11/23/2020   Total days of antibiotics 10/ day 7 of nafcillin          ID: Danny Hampton is a 65 y.o. adult with thoracic fusion c/b post-op infection with MSSA bacteremia and abscess Principal Problem:   Staphylococcus aureus bacteremia Active Problems:   Acute respiratory failure with hypoxia (HCC)   GERD (gastroesophageal reflux disease)   Myotonia congenita   Hyperlipidemia   Intractable abdominal pain   Abdominal distension   Generalized abdominal pain   Postoperative wound infection   Ileus, postoperative (HCC)   Acute exacerbation of chronic low back pain   Ileus (HCC)   Abnormal transaminases   Demoralization    Subjective: Encephalopathy improving. Tolerating nafcillin  abtx. Had TEE which ruled out  vegetations this morning  Medications:   acetaminophen  1,000 mg Oral Q8H   aspirin  81 mg Oral Daily   bisacodyl  10 mg Rectal Daily   budesonide (PULMICORT) nebulizer solution  0.25 mg Nebulization BID   carbidopa-levodopa  2 tablet Oral QID   [START ON 12/04/2020] Carbidopa-Levodopa ER  1 tablet Oral QID   carvedilol  25 mg Oral BID WC   Chlorhexidine Gluconate Cloth  6 each Topical Daily   [START ON 12/04/2020] diltiazem  240 mg Oral Daily   docusate sodium  100 mg Oral BID   heparin  5,000 Units Subcutaneous Q8H   insulin aspart  0-9 Units Subcutaneous Q4H   loratadine  10 mg Oral Daily   [START ON 12/04/2020] magnesium oxide  400 mg Oral BID   metoCLOPramide (REGLAN) injection  10 mg Intravenous Q12H   pantoprazole  40 mg Oral Q1200   PARoxetine  20 mg Oral BID   phenytoin (DILANTIN) IV  100 mg Intravenous BID   polyethylene glycol  17 g Per Tube Daily   rOPINIRole  0.5 mg Oral 2 times per day   senna  1 tablet Oral BID   sodium chloride flush  10-40 mL Intracatheter Q12H   sodium chloride flush  3 mL Intravenous Q12H    Objective: Vital signs in last 24 hours: Temp:  [97.3 F (36.3  C)-98.8 F (37.1 C)] 98 F (36.7 C) (09/26 1200) Pulse Rate:  [71-97] 74 (09/26 1200) Resp:  [9-18] 11 (09/26 1200) BP: (87-139)/(52-111) 139/81 (09/26 1200) SpO2:  [92 %-100 %] 96 % (09/26 1200) Weight:  [95.8 kg] 95.8 kg (09/26 0921)  Physical Exam  Constitutional: He is oriented to person, place, and time. He appears well-developed and well-nourished. No distress.  HENT:  Mouth/Throat: Oropharynx is clear and moist. No oropharyngeal exudate.  Cardiovascular: Normal rate, regular rhythm and normal heart sounds. Exam reveals no gallop and no friction rub.  No murmur heard.  Pulmonary/Chest: Effort normal and breath sounds normal. No respiratory distress. He has no wheezes.  Abdominal: Soft. Bowel sounds are normal. He exhibits no distension. There is no tenderness.  Lymphadenopathy:  He has no cervical adenopathy.  Neurological: He is alert and oriented to person, place, and time.  Skin: Skin is warm and dry. No rash noted. No erythema.  Psychiatric: He has a normal mood and affect. His behavior is normal.    Lab Results Recent Labs    12/02/20 0430 12/03/20 0400  WBC 6.4 6.2  HGB 8.6* 8.7*  HCT 25.5* 26.2*  NA 135 138  K 2.9* 3.4*  CL 97* 97*  CO2 29 34*  BUN  5* <5*  CREATININE 0.74 0.68   Liver Panel Recent Labs    12/02/20 0430 12/03/20 0400  PROT 4.6* 4.7*  ALBUMIN 1.6* 1.7*  AST 21 21  ALT 8 24  ALKPHOS 39 41  BILITOT 1.1 1.3*   Lab Results  Component Value Date   ESRSEDRATE 73 (H) 11/26/2020     Microbiology: reviewed Studies/Results: ECHO TEE  Result Date: 12/03/2020    TRANSESOPHOGEAL ECHO REPORT   Patient Name:   Danny Hampton Blust Date of Exam: 12/03/2020 Medical Rec #:  [id number]            Height:       64.0 in Accession #:    [id number]           Weight:       211.2 lb Date of Birth:  1955/10/23            BSA:          2.002 m Patient Age:    65 years             BP:           139/81 mmHg Patient Gender: M                    HR:            74 bpm. Exam Location:  Inpatient Procedure: Transesophageal Echo, Cardiac Doppler and Color Doppler Indications:     Bacteremia  History:         Patient has prior history of Echocardiogram examinations, most                  recent 11/25/2020. Arrythmias:Atrial Flutter,                  Signs/Symptoms:Bacteremia and MSSA, resp. failure; Risk                  Factors:Hypertension and Dyslipidemia.  Sonographer:     Dustin Flock RDCS Referring Phys:  [phone] Ria Comment B ROBERTS Diagnosing Phys: Candee Furbish MD PROCEDURE: The transesophogeal probe was passed without difficulty through the esophogus of the patient. Sedation performed by different physician. The patient was monitored while under deep sedation. Anesthestetic sedation was provided intravenously by Anesthesiology: 105.38mg  of Propofol. The patient's vital signs; including heart rate, blood pressure, and oxygen saturation; remained stable throughout the procedure. The patient developed no complications during the procedure. IMPRESSIONS  1. Left ventricular ejection fraction, by estimation, is 60 to 65%. The left ventricle has normal function. The left ventricle has no regional wall motion abnormalities.  2. Right ventricular systolic function is normal. The right ventricular size is normal.  3. No left atrial/left atrial appendage thrombus was detected.  4. The mitral valve is normal in structure. Trivial mitral valve regurgitation. No evidence of mitral stenosis.  5. The aortic valve is normal in structure. Aortic valve regurgitation is not visualized. No aortic stenosis is present.  6. The inferior vena cava is normal in size with greater than 50% respiratory variability, suggesting right atrial pressure of 3 mmHg. Conclusion(s)/Recommendation(s): Normal biventricular function without evidence of hemodynamically significant valvular heart disease. No evidence of vegetation/infective endocarditis on this transesophageal echocardiogram. FINDINGS  Left  Ventricle: Left ventricular ejection fraction, by estimation, is 60 to 65%. The left ventricle has normal function. The left ventricle has no regional wall motion abnormalities. The left ventricular internal cavity size was normal in size. There is  no left ventricular hypertrophy. Right  Ventricle: The right ventricular size is normal. No increase in right ventricular wall thickness. Right ventricular systolic function is normal. Left Atrium: Left atrial size was normal in size. No left atrial/left atrial appendage thrombus was detected. Right Atrium: Right atrial size was normal in size. Pericardium: There is no evidence of pericardial effusion. Mitral Valve: The mitral valve is normal in structure. Trivial mitral valve regurgitation. No evidence of mitral valve stenosis. Tricuspid Valve: The tricuspid valve is normal in structure. Tricuspid valve regurgitation is not demonstrated. No evidence of tricuspid stenosis. Aortic Valve: The aortic valve is normal in structure. Aortic valve regurgitation is not visualized. No aortic stenosis is present. Pulmonic Valve: The pulmonic valve was normal in structure. Pulmonic valve regurgitation is not visualized. No evidence of pulmonic stenosis. Aorta: The aortic root is normal in size and structure. Venous: The inferior vena cava is normal in size with greater than 50% respiratory variability, suggesting right atrial pressure of 3 mmHg. IAS/Shunts: No atrial level shunt detected by color flow Doppler. Candee Furbish MD Electronically signed by Candee Furbish MD Signature Date/Time: 12/03/2020/2:50:03 PM    Final      Assessment/Plan: MSSA bacteremia and post op wound/deep tissue infection = plan to treat for 6 wk. We will treat with 2 wk of nafcillin or as tolerated then switch the remaining course with cefazolin. Not candidate for rifampin due to taking phenytoin.  Will need evaluation for rehab    [facility] Infect[facility] Pager:  [phone]  12/03/2020, 3:52 PM

## 2020-12-03 NOTE — CV Procedure (Signed)
   Transesophageal Echocardiogram  Indications:bacteremia  Time out performed  During this procedure the patient was administered propofol under anesthesiology supervision to achieve and maintain moderate sedation.  The patient's heart rate, blood pressure, and oxygen saturation are monitored continuously during the procedure.   Findings:  Left Ventricle: Normal 55% EF  Mitral Valve: Normal, trace MR  Aortic Valve: Normal  Tricuspid Valve: Normal, trace TR  Left Atrium: Normal, no left atrial appendage thrombus  Right Atrium: Normal  Intraatrial septum: Normal  Bubble Contrast Study: n/a  IMPRESSION: No endocarditis, no vegetation  Candee Furbish, MD

## 2020-12-03 NOTE — Progress Notes (Signed)
   12/03/20 1105  Clinical Encounter Type  Visited With Patient  Visit Type Initial;Spiritual support  Referral From Nurse  Consult/Referral To Chaplain   Chaplain responded to the consult request. The chaplain engaged in reflective listening as the patient spoke of his experience here. The patient expressed how well the unit nurses treat him. He said his MRI visit was painful because of how the associate handled him. The patient said he was "lifted and dropped" on the MRI table and hollered because he was in so much pain. He said he refused further treatment for fear of not being handled with care. His attending nurse TK (RN), came into the room, and chaplain made him aware of the patient's concern. Chaplain ended the visit with prayer. This note was prepared by Jeanine Luz, M.Div..  For questions please contact by phone [phone].

## 2020-12-03 NOTE — Progress Notes (Signed)
PT Cancellation Note  Patient Details Name: Danny Hampton MRN: [id number] DOB: [birth year]   Cancelled Treatment:    Reason Eval/Treat Not Completed: Patient at procedure or test/unavailable. Pt off floor at TEE. Acute PT to return as able to progress mobility.  Kittie Plater, PT, DPT Acute Rehabilitation Services Pager #: [phone] Office #: [phone]    Berline Lopes 12/03/2020, 10:00 AM

## 2020-12-03 NOTE — Anesthesia Preprocedure Evaluation (Signed)
Anesthesia Evaluation  Patient identified by MRN, date of birth, ID band Patient awake    Reviewed: Allergy & Precautions, NPO status , Patient's Chart, lab work & pertinent test results, reviewed documented beta blocker date and time   History of Anesthesia Complications (+) Family history of anesthesia reaction and history of anesthetic complications  Airway Mallampati: II  TM Distance: >3 FB Neck ROM: Full    Dental  (+) Poor Dentition, Dental Advisory Given, Missing, Edentulous Upper, Loose,    Pulmonary neg pulmonary ROS, former smoker,     + wheezing      Cardiovascular hypertension, Pt. on home beta blockers and Pt. on medications + dysrhythmias  Rhythm:Regular Rate:Tachycardia  Echo 11/25/2020 1. Left ventricular ejection fraction, by estimation, is 60 to 65%. The left ventricle has normal function. The left ventricle has no regional wall motion abnormalities. There is mild concentric left ventricular hypertrophy. Left ventricular diastolic parameters were normal.  2. Right ventricular systolic function is mildly reduced. The right ventricular size is moderately enlarged. Tricuspid regurgitation signal is inadequate for assessing PA pressure.  3. The mitral valve is grossly normal. Trivial mitral valve  regurgitation. No evidence of mitral stenosis.  4. The aortic valve is tricuspid. Aortic valve regurgitation is not visualized. No aortic stenosis is present.  5. The inferior vena cava is normal in size with <50% respiratory variability, suggesting right atrial pressure of 8 mmHg.    Neuro/Psych PSYCHIATRIC DISORDERS Anxiety  Neuromuscular disease    GI/Hepatic Neg liver ROS, GERD  ,  Endo/Other  diabetes  Renal/GU negative Renal ROS     Musculoskeletal  (+) Arthritis ,   Abdominal   Peds  Hematology negative hematology ROS (+)   Anesthesia Other Findings   Reproductive/Obstetrics                              Anesthesia Physical  Anesthesia Plan  ASA: 3  Anesthesia Plan: MAC   Post-op Pain Management:    Induction: Intravenous  PONV Risk Score and Plan: 2 and Ondansetron, Midazolam, Treatment may vary due to age or medical condition and Propofol infusion  Airway Management Planned: Natural Airway  Additional Equipment:   Intra-op Plan:   Post-operative Plan:   Informed Consent: I have reviewed the patients History and Physical, chart, labs and discussed the procedure including the risks, benefits and alternatives for the proposed anesthesia with the patient or authorized representative who has indicated his/her understanding and acceptance.     Dental advisory given  Plan Discussed with: CRNA  Anesthesia Plan Comments:         Anesthesia Quick Evaluation

## 2020-12-03 NOTE — Progress Notes (Signed)
Subjective: Patient reports moderate back pain, no acute events overnight from a back perspective.   Objective: Vital signs in last 24 hours: Temp:  [97.3 F (36.3 C)-98.8 F (37.1 C)] 98 F (36.7 C) (09/26 1200) Pulse Rate:  [71-97] 74 (09/26 1200) Resp:  [9-18] 11 (09/26 1200) BP: (87-139)/(52-111) 139/81 (09/26 1200) SpO2:  [92 %-100 %] 96 % (09/26 1200) Weight:  [95.8 kg] 95.8 kg (09/26 0921)  Intake/Output from previous day: 09/25 0701 - 09/26 0700 In: 679.5 [I.V.:19.1; IV Piggyback:660.4] Out: 1035 [Urine:975; Drains:60] Intake/Output this shift: Total I/O In: 102.7 [IV Piggyback:102.7] Out: 950 [Urine:950]  Neurologic: Grossly normal  Lab Results: Lab Results  Component Value Date   WBC 6.2 12/03/2020   HGB 8.7 (L) 12/03/2020   HCT 26.2 (L) 12/03/2020   MCV 94.2 12/03/2020   PLT 248 12/03/2020   Lab Results  Component Value Date   INR 0.9 10/07/2018   BMET Lab Results  Component Value Date   NA 138 12/03/2020   K 3.4 (L) 12/03/2020   CL 97 (L) 12/03/2020   CO2 34 (H) 12/03/2020   GLUCOSE 136 (H) 12/03/2020   BUN <5 (L) 12/03/2020   CREATININE 0.68 12/03/2020   CALCIUM 7.9 (L) 12/03/2020    Studies/Results: No results found.  Assessment/Plan: Wound vac changed with wound care nurse. Patient tolerated well. MAE well. Continue IV abx per ID.    LOS: 9 days    Danny Hampton [facility] 12/03/2020, 2:48 PM

## 2020-12-03 NOTE — NC FL2 (Signed)
[place] MEDICAID FL2 LEVEL OF CARE SCREENING TOOL     IDENTIFICATION  Patient Name: Danny Hampton Birthdate: [birth year] Sex: adult Admission Date (Current Location): 11/23/2020  [facility] and [place] Number:  [employer] and Address:  [facility] [place] [address], [place], [place] [address]      Provider Number: [id number]  Attending Physician Name and Address:  Elodia Florence., *  Relative Name and Phone Number:       Current Level of Care: Hospital Recommended Level of Care: [place] Prior Approval Number:    Date Approved/Denied:   PASRR Number: [id number]  Discharge Plan: SNF    Current Diagnoses: Patient Active Problem List   Diagnosis Date Noted   Demoralization 12/01/2020   Ileus ([place])    Abnormal transaminases    Staphylococcus aureus bacteremia 11/25/2020   Postoperative wound infection 11/25/2020   Ileus, postoperative ([place]) 11/25/2020   Acute exacerbation of chronic low back pain 11/25/2020   Intractable abdominal pain 11/24/2020   Abdominal distension 11/24/2020   Generalized abdominal pain    Spinal stenosis of lumbar region 10/31/2020   Primary hypertension    Hyperlipidemia    Acute respiratory failure with hypoxia ([place]) 04/21/2020   Hyponatremia 04/21/2020   Hypoalbuminemia 04/21/2020   GERD (gastroesophageal reflux disease) 04/21/2020   Hyperglycemia due to diabetes mellitus ([place]) 04/21/2020   Obesity (BMI 30-39.9) 04/21/2020   Myotonia congenita 04/21/2020   Acute respiratory failure due to COVID-19 ([place]) 04/20/2020   Pain in right shoulder 09/21/2019   Total knee replacement status, right 10/12/2018   Unilateral primary osteoarthritis, right knee 09/30/2018   Cervicalgia 04/21/2018   Bilateral primary osteoarthritis of knee 01/03/2016    Orientation RESPIRATION BLADDER Height & Weight     Self, Time, Situation, Place  O2 (Nasal cannula 3L) Continent Weight: 211 lb 3.2 oz  (95.8 kg) Height:  5\' 4"  (162.6 cm)  BEHAVIORAL SYMPTOMS/MOOD NEUROLOGICAL BOWEL NUTRITION STATUS      Continent Diet (see dc summary)  AMBULATORY STATUS COMMUNICATION OF NEEDS Skin   Extensive Assist Verbally Surgical wounds, Wound Vac (closed incision on back; wound vac on vertebral column (wound need to be ordered at [facility]))                       Personal Care Assistance Level of Assistance  Bathing, Feeding, Dressing Bathing Assistance: Limited assistance Feeding assistance: Independent Dressing Assistance: Limited assistance     Functional Limitations Info             [place]ard  PT (By licensed PT), OT (By licensed OT)     PT Frequency: 5x/week OT Frequency: 5x/week            Contractures Contractures Info: Not present    Additional Factors Info  Code Status, Allergies Code Status Info: Full Allergies Info: NKA           Current Medications (12/03/2020):  This is the current hospital active medication list Current Facility-Administered Medications  Medication Dose Route Frequency Provider Last Rate Last Admin   0.9 %  sodium chloride infusion  250 mL Intravenous Continuous Kristeen Miss, MD   Stopped at 12/02/20 2235   [MAR Hold] acetaminophen (TYLENOL) tablet 650 mg  650 mg Oral Q4H PRN Kristeen Miss, MD   650 mg at 12/01/20 1720   Or   [MAR Hold] acetaminophen (TYLENOL) suppository 650 mg  650 mg Rectal Q4H PRN Elsner,  Mallie Mussel, MD       [facility] Hold] albuterol (PROVENTIL) (2.5 MG/3ML) 0.083% nebulizer solution 2.5 mg  2.5 mg Nebulization Q2H PRN Minor, Grace Bushy, NP   2.5 mg at 11/28/20 1949   [MAR Hold] aspirin chewable tablet 81 mg  81 mg Oral Daily Minor, Grace Bushy, NP   81 mg at 12/02/20 1034   [MAR Hold] bisacodyl (DULCOLAX) suppository 10 mg  10 mg Rectal Daily Meuth, Brooke A, PA-C   10 mg at 12/02/20 1035   [MAR Hold] budesonide (PULMICORT) nebulizer solution 0.25 mg  0.25 mg Nebulization BID Jacky Kindle, MD   0.25 mg at  12/03/20 0735   [MAR Hold] Carbidopa-Levodopa ER 48.75-195 MG CPCR 1 tablet  1 tablet Oral QID Minor, Grace Bushy, NP   1 tablet at 12/02/20 1529   [MAR Hold] carvedilol (COREG) tablet 25 mg  25 mg Oral BID WC Jacky Kindle, MD   25 mg at 12/02/20 0740   [MAR Hold] Chlorhexidine Gluconate Cloth 2 % PADS 6 each  6 each Topical Daily Minor, Grace Bushy, NP   6 each at 12/02/20 2155   [facility] Hold] diltiazem (CARDIZEM) tablet 90 mg  90 mg Oral Q6H Chand, Currie Paris, MD   90 mg at 12/03/20 0507   [MAR Hold] docusate sodium (COLACE) capsule 100 mg  100 mg Oral BID Kristeen Miss, MD   100 mg at 12/02/20 2145   [facility] Hold] heparin injection 5,000 Units  5,000 Units Subcutaneous Q8H Minor, Grace Bushy, NP   5,000 Units at 12/03/20 0511   [facility] Hold] HYDROmorphone (DILAUDID) injection 0.5-1 mg  0.5-1 mg Intravenous Q3H PRN Minor, Grace Bushy, NP   1 mg at 12/03/20 0718   [MAR Hold] hydrOXYzine (ATARAX/VISTARIL) tablet 25 mg  25 mg Oral TID PRN Minor, Grace Bushy, NP   25 mg at 12/02/20 1529   [MAR Hold] insulin aspart (novoLOG) injection 0-9 Units  0-9 Units Subcutaneous Q4H Jacky Kindle, MD   1 Units at 12/03/20 0400   lactated ringers infusion    Continuous PRN Jerline Pain, MD 20 mL/hr at 12/03/20 0927 1,000 mL at 12/03/20 0927   [MAR Hold] loratadine (CLARITIN) tablet 10 mg  10 mg Oral Daily Minor, Grace Bushy, NP   10 mg at 12/02/20 1034   [MAR Hold] LORazepam (ATIVAN) injection 0.5 mg  0.5 mg Intravenous TID PRN Minor, Grace Bushy, NP   0.5 mg at 12/01/20 2251   [MAR Hold] magnesium oxide (MAG-OX) tablet 400 mg  400 mg Oral BID Elodia Florence., MD   400 mg at 12/02/20 2146   [MAR Hold] menthol-cetylpyridinium (CEPACOL) lozenge 3 mg  1 lozenge Oral PRN Kristeen Miss, MD       Or   [facility] Hold] phenol (CHLORASEPTIC) mouth spray 1 spray  1 spray Mouth/Throat PRN Kristeen Miss, MD       [facility] Hold] methocarbamol (ROBAXIN) 500 mg in dextrose 5 % 50 mL IVPB  500 mg Intravenous Q8H PRN Elodia Florence., MD   Stopped at  12/02/20 2152   [MAR Hold] metoCLOPramide (REGLAN) injection 10 mg  10 mg Intravenous Q12H Minor, Grace Bushy, NP   10 mg at 12/02/20 2148   [facility] Hold] nafcillin 12 g in sodium chloride 0.9 % 500 mL continuous infusion  12 g Intravenous Q24H Minor, Grace Bushy, NP 20.8 mL/hr at 12/03/20 0800 Infusion Verify at 12/03/20 0800   [MAR Hold] ondansetron (ZOFRAN) tablet 4 mg  4 mg Oral Q6H PRN Kristeen Miss,  MD       Or   [facility] Hold] ondansetron (ZOFRAN) injection 4 mg  4 mg Intravenous Q6H PRN Kristeen Miss, MD       [MAR Hold] oxyCODONE (Oxy IR/ROXICODONE) immediate release tablet 10 mg  10 mg Oral Q6H PRN Minor, Grace Bushy, NP   10 mg at 12/03/20 0507   [MAR Hold] pantoprazole (PROTONIX) EC tablet 40 mg  40 mg Oral Q1200 Elodia Florence., MD   40 mg at 12/02/20 1244   [MAR Hold] PARoxetine (PAXIL) tablet 20 mg  20 mg Oral BID Elodia Florence., MD   20 mg at 12/02/20 2145   [facility] Hold] phenytoin (DILANTIN) 200 mg in sodium chloride 0.9 % 100 mL IVPB  200 mg Intravenous QHS Ursula Beath, RPH   Stopped at 12/02/20 2225   [MAR Hold] phenytoin (DILANTIN) injection 100 mg  100 mg Intravenous BID Ursula Beath, RPH   100 mg at 12/03/20 0509   [MAR Hold] polyethylene glycol (MIRALAX / GLYCOLAX) packet 17 g  17 g Per Tube Daily Esterwood, Amy S, PA-C   17 g at 12/02/20 1034   [MAR Hold] rOPINIRole (REQUIP) tablet 0.5 mg  0.5 mg Oral 2 times per day Elodia Florence., MD   0.5 mg at 12/02/20 2146   [MAR Hold] senna (SENOKOT) tablet 8.6 mg  1 tablet Oral BID Elodia Florence., MD   8.6 mg at 12/02/20 2145   [facility] Hold] sodium chloride flush (NS) 0.9 % injection 10-40 mL  10-40 mL Intracatheter Q12H Jacky Kindle, MD   10 mL at 12/02/20 2156   [facility] Hold] sodium chloride flush (NS) 0.9 % injection 10-40 mL  10-40 mL Intracatheter PRN Jacky Kindle, MD       [facility] Hold] sodium chloride flush (NS) 0.9 % injection 3 mL  3 mL Intravenous Q12H Kristeen Miss, MD   3 mL at 12/02/20 2156   [facility] Hold] sodium  chloride flush (NS) 0.9 % injection 3 mL  3 mL Intravenous PRN Kristeen Miss, MD       [facility] Hold] sodium phosphate (FLEET) 7-19 GM/118ML enema 1 enema  1 enema Rectal Once PRN Kristeen Miss, MD         Discharge Medications: Please see discharge summary for a list of discharge medications.  Relevant Imaging Results:  Relevant Lab Results:   Additional Information SSN: [id number]. Not vaccinated for COVID.  Benard Halsted, LCSW

## 2020-12-03 NOTE — Transfer of Care (Signed)
Immediate Anesthesia Transfer of Care Note  Patient: Danny Hampton  Procedure(s) Performed: TRANSESOPHAGEAL ECHOCARDIOGRAM (TEE)  Patient Location: Endoscopy Unit  Anesthesia Type:MAC  Level of Consciousness: awake, alert  and oriented  Airway & Oxygen Therapy: Patient Spontanous Breathing  Post-op Assessment: Report given to RN and Post -op Vital signs reviewed and stable  Post vital signs: Reviewed and stable  Last Vitals:  Vitals Value Taken Time  BP 113/64 12/03/20 1023  Temp    Pulse 86 12/03/20 1024  Resp 15 12/03/20 1024  SpO2 98 % 12/03/20 1024  Vitals shown include unvalidated device data.  Last Pain:  Vitals:   12/03/20 0921  TempSrc: Temporal  PainSc: 7       Patients Stated Pain Goal: 3 (24/46/28 6381)  Complications: No notable events documented.

## 2020-12-03 NOTE — Interval H&P Note (Signed)
History and Physical Interval Note:  12/03/2020 9:19 AM  Danny Hampton  has presented today for surgery, with the diagnosis of bacteremia.  The various methods of treatment have been discussed with the patient and family. After consideration of risks, benefits and other options for treatment, the patient has consented to  Procedure(s): TRANSESOPHAGEAL ECHOCARDIOGRAM (TEE) (N/A) as a surgical intervention.  The patient's history has been reviewed, patient examined, no change in status, stable for surgery.  I have reviewed the patient's chart and labs.  Questions were answered to the patient's satisfaction.     [employer]

## 2020-12-03 NOTE — Anesthesia Procedure Notes (Signed)
Procedure Name: MAC Date/Time: 12/03/2020 10:00 AM Performed by: Griffin Dakin, CRNA Pre-anesthesia Checklist: Patient identified, Emergency Drugs available, Suction available, Patient being monitored and Timeout performed Patient Re-evaluated:Patient Re-evaluated prior to induction Oxygen Delivery Method: Simple face mask Induction Type: IV induction Placement Confirmation: positive ETCO2 and breath sounds checked- equal and bilateral Dental Injury: Teeth and Oropharynx as per pre-operative assessment

## 2020-12-03 NOTE — Consult Note (Addendum)
[facility] Nurse Consult Note: Reason for Consult: first post op dressing change of NPWT dressing Spinal wound s/p I&D surgical wound  Wound type: full thickness surgical wound Pressure Injury POA: NA Measurement: 7cm x 3cm x 3cm  Wound bed:100% pink, clean, bone palpated with sterile applicator Drainage (amount, consistency, odor) serous; pooling in the wound Periwound:intact  Dressing procedure/placement/frequency: Removed old NPWT dressing Filled wound with  _1__ piece of black foam Sealed NPWT dressing at 24mm HG Patient received IV pain medication per bedside nurse prior to dressing change Patient tolerated procedure well  OK for bedside nurses to change uncomplicated NPWT dressing 2 small VAC dressings ordered to the bedside for remainder of this week.  Discussed with patient use of NPWT device at home if needed.   [place], [place], [place]

## 2020-12-03 NOTE — Anesthesia Postprocedure Evaluation (Signed)
Anesthesia Post Note  Patient: Hrithik Boschee  Procedure(s) Performed: TRANSESOPHAGEAL ECHOCARDIOGRAM (TEE)     Patient location during evaluation: PACU Anesthesia Type: MAC Level of consciousness: awake and alert and oriented Pain management: pain level controlled Vital Signs Assessment: post-procedure vital signs reviewed and stable Respiratory status: spontaneous breathing, nonlabored ventilation and respiratory function stable Cardiovascular status: stable and blood pressure returned to baseline Postop Assessment: no apparent nausea or vomiting Anesthetic complications: no   No notable events documented.  Last Vitals:  Vitals:   12/03/20 1032 12/03/20 1041  BP: 126/69 (!) 114/52  Pulse: 88 78  Resp: 16 18  Temp:    SpO2: 92% 100%    Last Pain:  Vitals:   12/03/20 1041  TempSrc:   PainSc: 10-Worst pain ever                 Chung Chagoya A.

## 2020-12-03 NOTE — Progress Notes (Signed)
PROGRESS NOTE    Danny Hampton  ATF:[id number] DOB: 1956/03/08 DOA: 11/23/2020 PCP: [facility] [place]   Chief Complaint  Patient presents with   Constipation   Brief Narrative:  65 yo with hx GERD, dysrhythmia, HTN, HLD, myotonia congenita, T2DM, and recent lumbar decompression and extension of posterior spinal instrumented fusion (discharged 8/27) who presented to [facility] on 9/17 with intractable abdominal pain and was found to have an ileus.  He was transferred to [facility] for neurosurgery evaluation and management in the setting of MSSA bacteremia and post operative wound infection.  He's now s/p debridement of postoperative wound infection on 9/20 and he was transferred to the ICU on this date.  Hospitalization was complicated by atrial fibrillation with RVR.    Transferred to hospitalist 9/24.  Plan is for TEE Monday, 9/26.  Continue nafcillin.  NG in place, currently clamped.    ID, cardiology, GI are following.  Surgery and PCCM have signed off.  Assessment & Plan:   Principal Problem:   Staphylococcus aureus bacteremia Active Problems:   Acute respiratory failure with hypoxia (HCC)   GERD (gastroesophageal reflux disease)   Myotonia congenita   Hyperlipidemia   Intractable abdominal pain   Abdominal distension   Generalized abdominal pain   Postoperative wound infection   Ileus, postoperative (HCC)   Acute exacerbation of chronic low back pain   Ileus (HCC)   Abnormal transaminases   Demoralization  MSSA Bacteremia  Postoperative Lumbar Wound Infection S/p debridement of postop lumbar wound infection 9/20 9/17 cx show MSSA bacteremia 9/18 wound cx and 9/20 soft tissue abscess with MSSA 9//18 blood cx no growth Echo 9/18 with no RWMA, mildly reduced RVSF - no evidence of valvular vegetations Plan for TEE on 9/26 - no endocarditis/vegetation (follow final report) ID c/s, appreciate recs - planning for nafcillin x2 weeks, then cefazolin to conclude 6  weeks, followed by prolonged course of oral abx Wound vac in place, per NSGY, appreciate recommendations  Ileus NG removed ADAT to fulls from clears KUB 9/23 with persistent air distended large and small bowel loops throughout abdomen Appreciate GI recommendations - continue reglan, minimize narcotics - now signed off, no need for outpatient GI follow up  Atrial Fibrillation with RVR Diltiazem 90 q6, coreg 25 mg BID Cardiology c/s, appreciate recs Not currently candidate for anticoagulation  Hypertension Hypotension yesterday, BP better today Hold lisinopril.  Continue coreg, dilt HCTZ on hold  HLD Continue lipitor Continue aspirin  Myotonia Congenita He notes sinemet and dilantin for this  RLS ropinirole  Hypokalemia  Hypomagnesemia Replace, follow  Suicidal Statement Psychiatry c/s, appreciate recommendations -> recommended routine psych follow up (thought low risk of suicide).  Recommended decrease paroxetine to 20 mg BID. Continue paxil, ativan prn hydroxyzine  DVT prophylaxis: heparin Code Status: full Family Communication: none at bedside Disposition:   Status is: Inpatient  Remains inpatient appropriate because:Inpatient level of care appropriate due to severity of illness  Dispo: The patient is from:  home              Anticipated d/c is to:  pending              Patient currently is not medically stable to d/c.   Difficult to place patient No       Consultants:  Surgery IG [facility] PCCM ID GI  Procedures:  Debridement of postoperative lumbar wound infection 9/20  Echo IMPRESSIONS     1. Left ventricular ejection fraction, by estimation,  is 60 to 65%. The  left ventricle has normal function. The left ventricle has no regional  wall motion abnormalities. There is mild concentric left ventricular  hypertrophy. Left ventricular diastolic  parameters were normal.   2. Right ventricular systolic function is mildly reduced. The  right  ventricular size is moderately enlarged. Tricuspid regurgitation signal is  inadequate for assessing PA pressure.   3. The mitral valve is grossly normal. Trivial mitral valve  regurgitation. No evidence of mitral stenosis.   4. The aortic valve is tricuspid. Aortic valve regurgitation is not  visualized. No aortic stenosis is present.   5. The inferior vena cava is normal in size with <50% respiratory  variability, suggesting right atrial pressure of 8 mmHg.   Comparison(s): Changes from prior study are noted. LV function remains  unchanged. RV appears moderately dilated with mildly reduced function  which is new from prior study.   Conclusion(s)/Recommendation(s): No evidence of valvular vegetations on  this transthoracic echocardiogram. Would recommend Brigitt Mcclish transesophageal  echocardiogram to exclude infective endocarditis if clinically indicated. Antimicrobials:  Anti-infectives (From admission, onward)    Start     Dose/Rate Route Frequency Ordered Stop   11/27/20 2145  ceFAZolin (ANCEF) IVPB 2g/100 mL premix        2 g 200 mL/hr over 30 Minutes Intravenous Every 8 hours 11/27/20 2052 11/28/20 0619   11/27/20 1200  nafcillin 12 g in sodium chloride 0.9 % 500 mL continuous infusion        12 g 20.8 mL/hr over 24 Hours Intravenous Every 24 hours 11/27/20 1007     11/25/20 1400  ceFAZolin (ANCEF) IVPB 2g/100 mL premix  Status:  Discontinued        2 g 200 mL/hr over 30 Minutes Intravenous Every 8 hours 11/25/20 0915 11/27/20 1007   11/24/20 1400  piperacillin-tazobactam (ZOSYN) IVPB 3.375 g  Status:  Discontinued       See Hyperspace for full Linked Orders Report.   3.375 g 12.5 mL/hr over 240 Minutes Intravenous Every 8 hours 11/24/20 0755 11/25/20 0915   11/24/20 0800  piperacillin-tazobactam (ZOSYN) IVPB 3.375 g       See Hyperspace for full Linked Orders Report.   3.375 g 100 mL/hr over 30 Minutes Intravenous  Once 11/24/20 0755 11/24/20 0925        Subjective:  Complains of back pain  Objective: Vitals:   12/03/20 0921 12/03/20 1024 12/03/20 1032 12/03/20 1041  BP: 139/84 113/64 126/69 (!) 114/52  Pulse: 79 86 88 78  Resp: 13 13 16 18   Temp: (!) 97.3 F (36.3 C) 97.8 F (36.6 C)    TempSrc: Temporal Temporal    SpO2: 100% 98% 92% 100%  Weight: 95.8 kg     Height: 5\' 4"  (1.626 m)       Intake/Output Summary (Last 24 hours) at 12/03/2020 1130 Last data filed at 12/03/2020 0800 Gross per 24 hour  Intake 638.71 ml  Output 1435 ml  Net -796.29 ml   Filed Weights   12/02/20 0500 12/03/20 0500 12/03/20 0921  Weight: 96.4 kg 95.8 kg 95.8 kg    Examination:  General: No acute distress. Cardiovascular: RRR Lungs: unlabored Abdomen: Soft, nontender, nondistended  Neurological: Alert and oriented 3. Moves all extremities 4 . Cranial nerves II through XII grossly intact. Skin: back with wound vac in place Extremities: No clubbing or cyanosis. No edema.       Data Reviewed: I have personally reviewed following labs and imaging studies  CBC:  Recent Labs  Lab 11/29/20 0436 11/30/20 0522 12/01/20 0904 12/02/20 0430 12/03/20 0400  WBC 7.5 6.7 7.2 6.4 6.2  NEUTROABS 5.2 4.5 4.8 4.3 5.6  HGB 9.5* 9.5* 8.6* 8.6* 8.7*  HCT 27.8* 27.7* 26.0* 25.5* 26.2*  MCV 91.1 92.6 93.5 92.7 94.2  PLT 133* 164 197 227 921    Basic Metabolic Panel: Recent Labs  Lab 11/27/20 0851 11/28/20 0418 11/29/20 0436 12/01/20 0904 12/02/20 0430 12/03/20 0400  NA  --  135 131* 134* 135 138  K  --  3.7 3.9 3.4* 2.9* 3.4*  CL  --  97* 96* 98 97* 97*  CO2  --  22 24 26 29  34*  GLUCOSE  --  181* 133* 120* 125* 136*  BUN  --  16 20 11  5* <5*  CREATININE  --  1.08 0.92 0.93 0.74 0.68  CALCIUM  --  8.4* 8.3* 7.9* 7.8* 7.9*  MG 1.9 1.9  --   --  1.5* 1.8  PHOS 2.4* 2.5  --   --  5.9* 3.3    GFR: Estimated Creatinine Clearance (by C-G formula based on SCr of 0.68 mg/dL) Male: 78.7 mL/min Male: 96.1 mL/min  Liver Function  Tests: Recent Labs  Lab 11/28/20 0418 11/29/20 0436 12/01/20 0904 12/02/20 0430 12/03/20 0400  AST 296* 145* 25 21 21   ALT 103* 13 13 8 24   ALKPHOS 53 50 39 39 41  BILITOT 1.7* 1.9* 1.5* 1.1 1.3*  PROT 5.8* 5.3* 4.8* 4.6* 4.7*  ALBUMIN 2.3* 2.1* 1.7* 1.6* 1.7*    CBG: Recent Labs  Lab 12/02/20 1558 12/02/20 1947 12/02/20 2335 12/03/20 0314 12/03/20 0736  GLUCAP 162* 170* 123* 128* 115*     Recent Results (from the past 240 hour(s))  Resp Panel by RT-PCR (Flu Jaheem Hedgepath&B, Covid) Nasopharyngeal Swab     Status: None   Collection Time: 11/24/20 12:40 AM   Specimen: Nasopharyngeal Swab; Nasopharyngeal(NP) swabs in vial transport medium  Result Value Ref Range Status   SARS Coronavirus 2 by RT PCR NEGATIVE NEGATIVE Final    Comment: (NOTE) SARS-CoV-2 target nucleic acids are NOT DETECTED.  The SARS-CoV-2 RNA is generally detectable in upper respiratory specimens during the acute phase of infection. The lowest concentration of SARS-CoV-2 viral copies this assay can detect is 138 copies/mL. Taneah Masri negative result does not preclude SARS-Cov-2 infection and should not be used as the sole basis for treatment or other patient management decisions. Tamasha Laplante negative result may occur with  improper specimen collection/handling, submission of specimen other than nasopharyngeal swab, presence of viral mutation(s) within the areas targeted by this assay, and inadequate number of viral copies(<138 copies/mL). Chloey Ricard negative result must be combined with clinical observations, patient history, and epidemiological information. The expected result is Negative.  Fact Sheet for Patients:  EntrepreneurPulse.com.au  Fact Sheet for Healthcare Providers:  IncredibleEmployment.be  This test is no t yet approved or cleared by the [place] FDA and  has been authorized for detection and/or diagnosis of SARS-CoV-2 by FDA under an Emergency Use Authorization (EUA). This EUA  will remain  in effect (meaning this test can be used) for the duration of the COVID-19 declaration under Section 564(b)(1) of the Act, 21 U.S.C.section 360bbb-3(b)(1), unless the authorization is terminated  or revoked sooner.       Influenza Naly Schwanz by PCR NEGATIVE NEGATIVE Final   Influenza B by PCR NEGATIVE NEGATIVE Final    Comment: (NOTE) The Xpert Xpress SARS-CoV-2/FLU/RSV plus assay is intended as an aid in  the diagnosis of influenza from Nasopharyngeal swab specimens and should not be used as Roselle Norton sole basis for treatment. Nasal washings and aspirates are unacceptable for Xpert Xpress SARS-CoV-2/FLU/RSV testing.  Fact Sheet for Patients: EntrepreneurPulse.com.au  Fact Sheet for Healthcare Providers: IncredibleEmployment.be  This test is not yet approved or cleared by the [place] FDA and has been authorized for detection and/or diagnosis of SARS-CoV-2 by FDA under an Emergency Use Authorization (EUA). This EUA will remain in effect (meaning this test can be used) for the duration of the COVID-19 declaration under Section 564(b)(1) of the Act, 21 U.S.C. section 360bbb-3(b)(1), unless the authorization is terminated or revoked.  Performed at [facility], [address], [place], [place] [address]   Culture, blood (routine x 2)     Status: Abnormal   Collection Time: 11/24/20  8:15 AM   Specimen: BLOOD LEFT HAND  Result Value Ref Range Status   Specimen Description   Final    BLOOD LEFT HAND BOTTLES DRAWN AEROBIC AND ANAEROBIC Performed at [facility], [address] [place], [place] [address]    Special Requests   Final    Blood Culture adequate volume Performed at [facility], [address], [place], [place] [address]    Culture  Setup Time   Final    GRAM POSITIVE COCCI IN BOTH AEROBIC AND ANAEROBIC BOTTLES CRITICAL RESULT CALLED TO, READ BACK BY AND VERIFIED WITH: PHARM D L.POOLE ON [phone] AT 0818 BY E.PARRISH Performed at  [facility], [place] [address] [place], [place] [address]    Culture STAPHYLOCOCCUS AUREUS (Hesston Hitchens)  Final   Report Status 11/27/2020 FINAL  Final   Organism ID, Bacteria STAPHYLOCOCCUS AUREUS  Final      Susceptibility   Staphylococcus aureus - MIC*    CIPROFLOXACIN <=0.5 SENSITIVE Sensitive     ERYTHROMYCIN <=0.25 SENSITIVE Sensitive     GENTAMICIN <=0.5 SENSITIVE Sensitive     OXACILLIN <=0.25 SENSITIVE Sensitive     TETRACYCLINE <=1 SENSITIVE Sensitive     VANCOMYCIN 1 SENSITIVE Sensitive     TRIMETH/SULFA <=10 SENSITIVE Sensitive     CLINDAMYCIN <=0.25 SENSITIVE Sensitive     RIFAMPIN <=0.5 SENSITIVE Sensitive     Inducible Clindamycin NEGATIVE Sensitive     * STAPHYLOCOCCUS AUREUS  Blood Culture ID Panel (Reflexed)     Status: Abnormal   Collection Time: 11/24/20  8:15 AM  Result Value Ref Range Status   Enterococcus faecalis NOT DETECTED NOT DETECTED Final   Enterococcus Faecium NOT DETECTED NOT DETECTED Final   Listeria monocytogenes NOT DETECTED NOT DETECTED Final   Staphylococcus species DETECTED (Breyana Follansbee) NOT DETECTED Final    Comment: CRITICAL RESULT CALLED TO, READ BACK BY AND VERIFIED WITH: PHARM D L.POOLE ON [phone] AT 0818 BY E.PARRISH    Staphylococcus aureus (BCID) DETECTED (Makenzi Bannister) NOT DETECTED Final    Comment: CRITICAL RESULT CALLED TO, READ BACK BY AND VERIFIED WITH: PHARM D L.POOLE ON [id number] AT 0818 BY E.PARRISH    Staphylococcus epidermidis NOT DETECTED NOT DETECTED Final   Staphylococcus lugdunensis NOT DETECTED NOT DETECTED Final   Streptococcus species NOT DETECTED NOT DETECTED Final   Streptococcus agalactiae NOT DETECTED NOT DETECTED Final   Streptococcus pneumoniae NOT DETECTED NOT DETECTED Final   Streptococcus pyogenes NOT DETECTED NOT DETECTED Final   Anayiah Howden.calcoaceticus-baumannii NOT DETECTED NOT DETECTED Final   Bacteroides fragilis NOT DETECTED NOT DETECTED Final   Enterobacterales NOT DETECTED NOT DETECTED Final   Enterobacter cloacae complex NOT  DETECTED NOT DETECTED Final  Escherichia coli NOT DETECTED NOT DETECTED Final   Klebsiella aerogenes NOT DETECTED NOT DETECTED Final   Klebsiella oxytoca NOT DETECTED NOT DETECTED Final   Klebsiella pneumoniae NOT DETECTED NOT DETECTED Final   Proteus species NOT DETECTED NOT DETECTED Final   Salmonella species NOT DETECTED NOT DETECTED Final   Serratia marcescens NOT DETECTED NOT DETECTED Final   Haemophilus influenzae NOT DETECTED NOT DETECTED Final   Neisseria meningitidis NOT DETECTED NOT DETECTED Final   Pseudomonas aeruginosa NOT DETECTED NOT DETECTED Final   Stenotrophomonas maltophilia NOT DETECTED NOT DETECTED Final   Candida albicans NOT DETECTED NOT DETECTED Final   Candida auris NOT DETECTED NOT DETECTED Final   Candida glabrata NOT DETECTED NOT DETECTED Final   Candida krusei NOT DETECTED NOT DETECTED Final   Candida parapsilosis NOT DETECTED NOT DETECTED Final   Candida tropicalis NOT DETECTED NOT DETECTED Final   Cryptococcus neoformans/gattii NOT DETECTED NOT DETECTED Final   Meth resistant mecA/C and MREJ NOT DETECTED NOT DETECTED Final    Comment: Performed at [facility], [place] [address], [place], [place] [address]  Culture, blood (routine x 2)     Status: Abnormal   Collection Time: 11/24/20  8:16 AM   Specimen: BLOOD RIGHT HAND  Result Value Ref Range Status   Specimen Description   Final    BLOOD RIGHT HAND BOTTLES DRAWN AEROBIC AND ANAEROBIC Performed at [facility], [address] [place], [place] [address]    Special Requests   Final    Blood Culture adequate volume Performed at [facility], [address], [place], [place] [address]    Culture  Setup Time   Final    GRAM POSITIVE COCCI IN BOTH AEROBIC AND ANAEROBIC BOTTLES CRITICAL VALUE NOTED.  VALUE IS CONSISTENT WITH PREVIOUSLY REPORTED AND CALLED VALUE.    Culture (Ashish Rossetti)  Final    STAPHYLOCOCCUS AUREUS SUSCEPTIBILITIES PERFORMED ON PREVIOUS CULTURE WITHIN THE LAST 5 DAYS. Performed at [facility], [place] [address], [place], [place] [address]    Report Status 11/27/2020 FINAL  Final  MRSA Next Gen by PCR, Nasal     Status: None   Collection Time: 11/24/20  3:30 PM   Specimen: Nasal Mucosa; Nasal Swab  Result Value Ref Range Status   MRSA by PCR Next Gen NOT DETECTED NOT DETECTED Final    Comment: (NOTE) The GeneXpert MRSA Assay (FDA approved for NASAL specimens only), is one component of Leisl Spurrier comprehensive MRSA colonization surveillance program. It is not intended to diagnose MRSA infection nor to guide or monitor treatment for MRSA infections. Test performance is not FDA approved in patients less than 60 years old. Performed at [facility], [address], [place], [place] [address]   Aerobic Culture w Gram Stain (superficial specimen)     Status: None   Collection Time: 11/25/20 11:00 AM   Specimen: Back; Wound  Result Value Ref Range Status   Specimen Description   Final    BACK Performed at [facility], [address], [place], [place] [address]    Special Requests   Final    Normal Performed at [facility], [address], [place], [place] [address]    Gram Stain   Final    NO WBC SEEN FEW GRAM POSITIVE COCCI Performed at [facility], [place] [address], [place],  [address]    Culture ABUNDANT STAPHYLOCOCCUS AUREUS  Final   Report Status 11/29/2020 FINAL  Final   Organism ID, Bacteria STAPHYLOCOCCUS  AUREUS  Final      Susceptibility   Staphylococcus aureus - MIC*    CIPROFLOXACIN <=0.5 SENSITIVE Sensitive     ERYTHROMYCIN <=0.25 SENSITIVE Sensitive     GENTAMICIN <=0.5 SENSITIVE Sensitive     OXACILLIN <=0.25 SENSITIVE Sensitive     TETRACYCLINE <=1 SENSITIVE Sensitive     VANCOMYCIN 1 SENSITIVE Sensitive     TRIMETH/SULFA <=10 SENSITIVE Sensitive     CLINDAMYCIN <=0.25 SENSITIVE Sensitive     RIFAMPIN <=0.5 SENSITIVE Sensitive     Inducible Clindamycin NEGATIVE Sensitive     * ABUNDANT STAPHYLOCOCCUS AUREUS  Culture, blood (routine x 2)      Status: None   Collection Time: 11/25/20  3:42 PM   Specimen: BLOOD LEFT FOREARM  Result Value Ref Range Status   Specimen Description   Final    BLOOD LEFT FOREARM BOTTLES DRAWN AEROBIC AND ANAEROBIC   Special Requests Blood Culture adequate volume  Final   Culture   Final    NO GROWTH 5 DAYS Performed at [facility], [address] [place], [place] [address]    Report Status 11/30/2020 FINAL  Final  Culture, blood (routine x 2)     Status: None   Collection Time: 11/25/20  3:55 PM   Specimen: Right Antecubital; Blood  Result Value Ref Range Status   Specimen Description   Final    RIGHT ANTECUBITAL BOTTLES DRAWN AEROBIC AND ANAEROBIC   Special Requests Blood Culture adequate volume  Final   Culture   Final    NO GROWTH 5 DAYS Performed at [facility], [address] [place], [place] [address]    Report Status 11/30/2020 FINAL  Final  Aerobic Culture w Gram Stain (superficial specimen)     Status: None   Collection Time: 11/27/20  1:41 PM   Specimen: Wound  Result Value Ref Range Status   Specimen Description WOUND  Final   Special Requests SOFT TISSUE ABCESS  Final   Gram Stain   Final    ABUNDANT WBC PRESENT,BOTH PMN AND MONONUCLEAR RARE GRAM POSITIVE COCCI Performed at [facility], [place] [address], [place], [place] [address]    Culture FEW STAPHYLOCOCCUS AUREUS  Final   Report Status 11/29/2020 FINAL  Final   Organism ID, Bacteria STAPHYLOCOCCUS AUREUS  Final      Susceptibility   Staphylococcus aureus - MIC*    CIPROFLOXACIN <=0.5 SENSITIVE Sensitive     ERYTHROMYCIN <=0.25 SENSITIVE Sensitive     GENTAMICIN <=0.5 SENSITIVE Sensitive     OXACILLIN <=0.25 SENSITIVE Sensitive     TETRACYCLINE <=1 SENSITIVE Sensitive     VANCOMYCIN 1 SENSITIVE Sensitive     TRIMETH/SULFA <=10 SENSITIVE Sensitive     CLINDAMYCIN <=0.25 SENSITIVE Sensitive     RIFAMPIN <=0.5 SENSITIVE Sensitive     Inducible Clindamycin NEGATIVE Sensitive     * FEW STAPHYLOCOCCUS AUREUS   Surgical PCR screen     Status: Abnormal   Collection Time: 11/27/20  1:58 PM   Specimen: Nasal Mucosa; Nasal Swab  Result Value Ref Range Status   MRSA, PCR NEGATIVE NEGATIVE Final   Staphylococcus aureus POSITIVE (Zakiah Gauthreaux) NEGATIVE Final    Comment: (NOTE) The Xpert SA Assay (FDA approved for NASAL specimens in patients 39 years of age and older), is one component of Vivian Neuwirth comprehensive surveillance program. It is not intended to diagnose infection nor to guide or monitor treatment. Performed at [facility], [place] [address] [place], [place] [address]  Radiology Studies: No results found.      Scheduled Meds:  aspirin  81 mg Oral Daily   bisacodyl  10 mg Rectal Daily   budesonide (PULMICORT) nebulizer solution  0.25 mg Nebulization BID   Carbidopa-Levodopa ER  1 tablet Oral QID   carvedilol  25 mg Oral BID WC   Chlorhexidine Gluconate Cloth  6 each Topical Daily   diltiazem  90 mg Oral Q6H   docusate sodium  100 mg Oral BID   heparin  5,000 Units Subcutaneous Q8H   insulin aspart  0-9 Units Subcutaneous Q4H   loratadine  10 mg Oral Daily   magnesium oxide  400 mg Oral BID   metoCLOPramide (REGLAN) injection  10 mg Intravenous Q12H   pantoprazole  40 mg Oral Q1200   PARoxetine  20 mg Oral BID   phenytoin (DILANTIN) IV  100 mg Intravenous BID   polyethylene glycol  17 g Per Tube Daily   rOPINIRole  0.5 mg Oral 2 times per day   senna  1 tablet Oral BID   sodium chloride flush  10-40 mL Intracatheter Q12H   sodium chloride flush  3 mL Intravenous Q12H   Continuous Infusions:  sodium chloride Stopped (12/02/20 2235)   methocarbamol (ROBAXIN) IV Stopped (12/02/20 2152)   nafcillin (NAFCIL) continuous infusion 20.8 mL/hr at 12/03/20 0800   phenytoin (DILANTIN) IV Stopped (12/02/20 2225)     LOS: 9 days    Time spent: over 30 min    Fayrene Helper, MD [facility]   To contact the attending provider between 7A-7P or the covering provider  during after hours 7P-7A, please log into the web site [email or web address] and access using universal [place] password for that web site. If you do not have the password, please call the hospital operator.  12/03/2020, 11:30 AM

## 2020-12-03 NOTE — TOC Progression Note (Signed)
Transition of Care [facility]) - Progression Note    Patient Details  Name: Danny Hampton MRN: [id number] Date of Birth: 16-Jul-1955  Transition of Care [facility]) CM/SW [place], LCSW Phone Number: 12/03/2020, 5:11 PM  Clinical Narrative:    CSW left voicemail for Raquel Sarna with the [facility] [phone]) to determine if they would offer patient a SNF contract. Otherwise, CSW left voicemail for patient's significant other to discuss Medicare SNF bed offers.    Expected Discharge Plan: Skilled Nursing Facility Barriers to Discharge: Ship broker, Continued Medical Work up, SNF Pending bed offer  Expected Discharge Plan and Services Expected Discharge Plan: [place] In-house Referral: Clinical Social Work   Post Acute Care Choice: [place] Living arrangements for the past 2 months: Single Family Home                           HH Arranged: IV Antibiotics HH Agency: [facility] Date HH Agency Contacted: 11/25/20 Time [place]: 9 Representative spoke with at [place]: [place] ([place]) Interventions    Readmission Risk Interventions No flowsheet data found.

## 2020-12-03 NOTE — Progress Notes (Addendum)
Progress Note  Patient Name: Danny Hampton Date of Encounter: 12/03/2020  [facility] Cardiologist: Rozann Lesches, MD   Subjective   Just came from Endo.  Maintaining sinus rhythm.  Inpatient Medications    Scheduled Meds:  aspirin  81 mg Oral Daily   bisacodyl  10 mg Rectal Daily   budesonide (PULMICORT) nebulizer solution  0.25 mg Nebulization BID   Carbidopa-Levodopa ER  1 tablet Oral QID   carvedilol  25 mg Oral BID WC   Chlorhexidine Gluconate Cloth  6 each Topical Daily   diltiazem  90 mg Oral Q6H   docusate sodium  100 mg Oral BID   heparin  5,000 Units Subcutaneous Q8H   insulin aspart  0-9 Units Subcutaneous Q4H   loratadine  10 mg Oral Daily   magnesium oxide  400 mg Oral BID   metoCLOPramide (REGLAN) injection  10 mg Intravenous Q12H   pantoprazole  40 mg Oral Q1200   PARoxetine  20 mg Oral BID   phenytoin (DILANTIN) IV  100 mg Intravenous BID   polyethylene glycol  17 g Per Tube Daily   rOPINIRole  0.5 mg Oral 2 times per day   senna  1 tablet Oral BID   sodium chloride flush  10-40 mL Intracatheter Q12H   sodium chloride flush  3 mL Intravenous Q12H   Continuous Infusions:  sodium chloride Stopped (12/02/20 2235)   methocarbamol (ROBAXIN) IV Stopped (12/02/20 2152)   nafcillin (NAFCIL) continuous infusion 20.8 mL/hr at 12/03/20 0800   phenytoin (DILANTIN) IV Stopped (12/02/20 2225)   PRN Meds: acetaminophen **OR** acetaminophen, albuterol, HYDROmorphone (DILAUDID) injection, hydrOXYzine, LORazepam, menthol-cetylpyridinium **OR** phenol, methocarbamol (ROBAXIN) IV, ondansetron **OR** ondansetron (ZOFRAN) IV, oxyCODONE, sodium chloride flush, sodium chloride flush, sodium phosphate   Vital Signs    Vitals:   12/03/20 0921 12/03/20 1024 12/03/20 1032 12/03/20 1041  BP: 139/84 113/64 126/69 (!) 114/52  Pulse: 79 86 88 78  Resp: 13 13 16 18   Temp: (!) 97.3 F (36.3 C) 97.8 F (36.6 C)    TempSrc: Temporal Temporal    SpO2: 100% 98% 92%  100%  Weight: 95.8 kg     Height: 5\' 4"  (1.626 m)       Intake/Output Summary (Last 24 hours) at 12/03/2020 1107 Last data filed at 12/03/2020 0800 Gross per 24 hour  Intake 638.71 ml  Output 1435 ml  Net -796.29 ml   Last 3 Weights 12/03/2020 12/03/2020 12/02/2020  Weight (lbs) 211 lb 3.2 oz 211 lb 3.2 oz 212 lb 8.4 oz  Weight (kg) 95.8 kg 95.8 kg 96.4 kg      Telemetry    Sinus rhythm at controlled ventricular rate- Personally Reviewed  ECG    N/a  Physical Exam   GEN: No acute distress.   Neck: No JVD Cardiac: RRR, no murmurs, rubs, or gallops.  Respiratory: Clear to auscultation bilaterally. GI: Soft, nontender, non-distended  MS: No edema; No deformity. Neuro:  Nonfocal  Psych: Normal affect   Labs    High Sensitivity Troponin:  No results for input(s): TROPONINIHS in the last 720 hours.   Chemistry Recent Labs  Lab 11/28/20 0418 11/29/20 0436 12/01/20 0904 12/02/20 0430 12/03/20 0400  NA 135   < > 134* 135 138  K 3.7   < > 3.4* 2.9* 3.4*  CL 97*   < > 98 97* 97*  CO2 22   < > 26 29 34*  GLUCOSE 181*   < > 120* 125* 136*  BUN 16   < > 11 5* <5*  CREATININE 1.08   < > 0.93 0.74 0.68  CALCIUM 8.4*   < > 7.9* 7.8* 7.9*  MG 1.9  --   --  1.5* 1.8  PROT 5.8*   < > 4.8* 4.6* 4.7*  ALBUMIN 2.3*   < > 1.7* 1.6* 1.7*  AST 296*   < > 25 21 21   ALT 103*   < > 13 8 24   ALKPHOS 53   < > 39 39 41  BILITOT 1.7*   < > 1.5* 1.1 1.3*  GFRNONAA >60   < > >60 >60 >60  ANIONGAP 16*   < > 10 9 7    < > = values in this interval not displayed.    Lipids No results for input(s): CHOL, TRIG, HDL, LABVLDL, LDLCALC, CHOLHDL in the last 168 hours.  Hematology Recent Labs  Lab 12/01/20 0904 12/02/20 0430 12/03/20 0400  WBC 7.2 6.4 6.2  RBC 2.78* 2.75* 2.78*  HGB 8.6* 8.6* 8.7*  HCT 26.0* 25.5* 26.2*  MCV 93.5 92.7 94.2  MCH 30.9 31.3 31.3  MCHC 33.1 33.7 33.2  RDW 13.2 13.3 13.5  PLT 197 227 248   Thyroid  Recent Labs  Lab 11/27/20 0851  TSH 3.489     BNP Recent Labs  Lab 11/27/20 0851  BNP 249.2*    DDimer No results for input(s): DDIMER in the last 168 hours.   Radiology    No results found.  Cardiac Studies   TEE 12/03/20 Findings:   Left Ventricle: Normal 55% EF   Mitral Valve: Normal, trace MR   Aortic Valve: Normal   Tricuspid Valve: Normal, trace TR   Left Atrium: Normal, no left atrial appendage thrombus   Right Atrium: Normal   Intraatrial septum: Normal   Bubble Contrast Study: n/a   IMPRESSION: No endocarditis, no vegetation  Echo 11/25/20 1. Left ventricular ejection fraction, by estimation, is 60 to 65%. The  left ventricle has normal function. The left ventricle has no regional  wall motion abnormalities. There is mild concentric left ventricular  hypertrophy. Left ventricular diastolic  parameters were normal.   2. Right ventricular systolic function is mildly reduced. The right  ventricular size is moderately enlarged. Tricuspid regurgitation signal is  inadequate for assessing PA pressure.   3. The mitral valve is grossly normal. Trivial mitral valve  regurgitation. No evidence of mitral stenosis.   4. The aortic valve is tricuspid. Aortic valve regurgitation is not  visualized. No aortic stenosis is present.   5. The inferior vena cava is normal in size with <50% respiratory  variability, suggesting right atrial pressure of 8 mmHg.   Patient Profile     65 y.o. 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  Bacteremia -TEE without vegetation   Will sign off.  Call with question.  Final  medication recommendations per Dr. Claiborne Billings.  For questions or updates, please contact [place] Please consult [email or web address] for contact info under        SignedLeanor Kail, PA  12/03/2020, 11:07 AM     Patient seen and examined. Agree with assessment and plan. Back from endo with TEE- no evidence for vegetation arguing against endocarditis. Pt is in sinus rhythm at 67 with BP `109-120 range on coreg 25 mg bid and diltiazem 90 mg q6h. Will dc short acting regimen and change to long acting cardizem at reduced dose initially at 240mg  CD daily and monitor.   Troy Sine, MD, [facility] 12/03/2020 2:46 PM

## 2020-12-04 DIAGNOSIS — B9561 Methicillin susceptible Staphylococcus aureus infection as the cause of diseases classified elsewhere: Secondary | ICD-10-CM | POA: Diagnosis not present

## 2020-12-04 DIAGNOSIS — R7881 Bacteremia: Secondary | ICD-10-CM | POA: Diagnosis not present

## 2020-12-04 LAB — CBC WITH DIFFERENTIAL/PLATELET
Abs Immature Granulocytes: 0.36 10*3/uL — ABNORMAL HIGH (ref 0.00–0.07)
Basophils Absolute: 0 10*3/uL (ref 0.0–0.1)
Basophils Relative: 1 %
Eosinophils Absolute: 0.1 10*3/uL (ref 0.0–0.5)
Eosinophils Relative: 3 %
HCT: 25.5 % — ABNORMAL LOW (ref 39.0–52.0)
Hemoglobin: 8.5 g/dL — ABNORMAL LOW (ref 13.0–17.0)
Immature Granulocytes: 8 %
Lymphocytes Relative: 16 %
Lymphs Abs: 0.8 10*3/uL (ref 0.7–4.0)
MCH: 31.5 pg (ref 26.0–34.0)
MCHC: 33.3 g/dL (ref 30.0–36.0)
MCV: 94.4 fL (ref 80.0–100.0)
Monocytes Absolute: 0.6 10*3/uL (ref 0.1–1.0)
Monocytes Relative: 11 %
Neutro Abs: 3 10*3/uL (ref 1.7–7.7)
Neutrophils Relative %: 61 %
Platelets: 229 10*3/uL (ref 150–400)
RBC: 2.7 MIL/uL — ABNORMAL LOW (ref 4.22–5.81)
RDW: 13.6 % (ref 11.5–15.5)
WBC: 4.8 10*3/uL (ref 4.0–10.5)
nRBC: 0 % (ref 0.0–0.2)

## 2020-12-04 LAB — RESP PANEL BY RT-PCR (FLU A&B, COVID) ARPGX2
Influenza A by PCR: NEGATIVE
Influenza B by PCR: NEGATIVE
SARS Coronavirus 2 by RT PCR: NEGATIVE

## 2020-12-04 LAB — COMPREHENSIVE METABOLIC PANEL
ALT: <5 U/L (ref 0–44)
AST: 21 U/L (ref 15–41)
Albumin: 1.6 g/dL — ABNORMAL LOW (ref 3.5–5.0)
Alkaline Phosphatase: 46 U/L (ref 38–126)
Anion gap: 8 (ref 5–15)
BUN: <5 mg/dL — ABNORMAL LOW (ref 8–23)
CO2: 34 mmol/L — ABNORMAL HIGH (ref 22–32)
Calcium: 7.7 mg/dL — ABNORMAL LOW (ref 8.9–10.3)
Chloride: 94 mmol/L — ABNORMAL LOW (ref 98–111)
Creatinine, Ser: 0.68 mg/dL (ref 0.61–1.24)
GFR, Estimated: >60 mL/min (ref >60)
Glucose, Bld: 104 mg/dL — ABNORMAL HIGH (ref 70–99)
Potassium: 3.3 mmol/L — ABNORMAL LOW (ref 3.5–5.1)
Sodium: 136 mmol/L (ref 135–145)
Total Bilirubin: 1.3 mg/dL — ABNORMAL HIGH (ref 0.3–1.2)
Total Protein: 4.7 g/dL — ABNORMAL LOW (ref 6.5–8.1)

## 2020-12-04 LAB — GLUCOSE, CAPILLARY
Glucose-Capillary: 102 mg/dL — ABNORMAL HIGH (ref 70–99)
Glucose-Capillary: 118 mg/dL — ABNORMAL HIGH (ref 70–99)
Glucose-Capillary: 138 mg/dL — ABNORMAL HIGH (ref 70–99)
Glucose-Capillary: 143 mg/dL — ABNORMAL HIGH (ref 70–99)
Glucose-Capillary: 122 mg/dL — ABNORMAL HIGH (ref 70–99)

## 2020-12-04 LAB — MAGNESIUM: Magnesium: 2 mg/dL (ref 1.7–2.4)

## 2020-12-04 LAB — PHOSPHORUS: Phosphorus: 3.2 mg/dL (ref 2.5–4.6)

## 2020-12-04 MED ORDER — CARBIDOPA-LEVODOPA 25-100 MG PO TABS
2.0000 | ORAL_TABLET | Freq: Once | ORAL | Status: AC
  Administered 2020-12-04: 2 via ORAL
  Filled 2020-12-04: qty 2

## 2020-12-04 MED ORDER — PHENYTOIN SODIUM EXTENDED 100 MG PO CAPS
100.0000 mg | ORAL_CAPSULE | Freq: Three times a day (TID) | ORAL | Status: DC
  Administered 2020-12-04 – 2020-12-07 (×10): 100 mg via ORAL
  Filled 2020-12-04 (×11): qty 1

## 2020-12-04 MED ORDER — POTASSIUM CHLORIDE CRYS ER 20 MEQ PO TBCR
40.0000 meq | EXTENDED_RELEASE_TABLET | ORAL | Status: AC
Start: 2020-12-04 — End: 2020-12-04
  Administered 2020-12-04 (×2): 40 meq via ORAL
  Filled 2020-12-04 (×2): qty 2

## 2020-12-04 MED ORDER — METOCLOPRAMIDE HCL 5 MG PO TABS
5.0000 mg | ORAL_TABLET | Freq: Three times a day (TID) | ORAL | Status: AC
  Administered 2020-12-04 – 2020-12-05 (×5): 5 mg via ORAL
  Filled 2020-12-04 (×7): qty 1

## 2020-12-04 MED ORDER — PHENYTOIN 50 MG PO CHEW: 100.0000 mg | CHEWABLE_TABLET | Freq: Three times a day (TID) | ORAL | Status: DC

## 2020-12-04 MED ORDER — METOCLOPRAMIDE HCL 5 MG PO TABS
5.0000 mg | ORAL_TABLET | Freq: Three times a day (TID) | ORAL | Status: DC | PRN
  Filled 2020-12-04: qty 1

## 2020-12-04 MED ORDER — POLYETHYLENE GLYCOL 3350 17 G PO PACK
17.0000 g | PACK | Freq: Every day | ORAL | Status: DC
  Filled 2020-12-04 (×3): qty 1

## 2020-12-04 NOTE — Progress Notes (Signed)
Occupational Therapy Treatment Patient Details Name: Danny Hampton MRN: [id number] DOB: 07-21-1955 Today's Date: 12/04/2020   History of present illness 65 y/o male presented to [facility] on 9/16 with complaints of abdominal distention and constipation. CT showed mildly dilated small bowel and some colonic distension with air-fluid level concerning for ileus. Transferred to [facility] for further evaluation by neurosurgery due to his initial lumbar decompression on 10/31/20 due to purulent drainage from thoracic incision and blood cultures positive for MSSA. Developed new onset Afib RVR. Patient s/p debridement of postoperative lumbar wound infection on 9/20. PMH: GERD, HTN, myotonia congenita, DM, dysrhythmia, HLD, acid reflux, anxiety.   OT comments  Pt progressing well with therapy this session but noted to desat on RA to 85%. Pt requires oxygen with adls and does report some dizziness. Pt requires (A) for safety and adls at this time and must reach MOD I to d/c home with girlfriend. Pt motivated to return home to his dog "zumba" and pending vacation. Recommendations SNF at this time.    Recommendations for follow up therapy are one component of a multi-disciplinary discharge planning process, led by the attending physician.  Recommendations may be updated based on patient status, additional functional criteria and insurance authorization.    Follow Up Recommendations  SNF    Equipment Recommendations  3 in 1 bedside commode    Recommendations for Other Services      Precautions / Restrictions Precautions Precautions: Fall;Back Precaution Comments: wound vac, no brace needed per order,       Mobility Bed Mobility Overal bed mobility: Needs Assistance Bed Mobility: Rolling;Supine to Sit Rolling: Min guard   Supine to sit: Min assist     General bed mobility comments: pt exiting on the L side and needs cues to sequence and how to maintain back precautions. pt able to initiate pushing  with L UE this session. pt static sitting min guard (A)    Transfers Overall transfer level: Needs assistance Equipment used: Rolling walker (2 wheeled) Transfers: Sit to/from Stand Sit to Stand: Min assist         General transfer comment: pt reliant on RW and needs (A) for line management. pt noted to desat 85% RA    Balance Overall balance assessment: Needs assistance Sitting-balance support: Bilateral upper extremity supported;Feet supported Sitting balance-Leahy Scale: Good Sitting balance - Comments: seated at EOB   Standing balance support: Bilateral upper extremity supported;During functional activity Standing balance-Leahy Scale: Poor Standing balance comment: reliant on UE support and external assist                           ADL either performed or assessed with clinical judgement   ADL Overall ADL's : Needs assistance/impaired Eating/Feeding: Set up   Grooming: Min guard;Standing Grooming Details (indicate cue type and reason): washing hands     Lower Body Bathing: Maximal assistance   Upper Body Dressing : Moderate assistance   Lower Body Dressing: Maximal assistance   Toilet Transfer: Moderate assistance;Ambulation;RW Toilet Transfer Details (indicate cue type and reason): static standing to void bladder Toileting- Clothing Manipulation and Hygiene: Minimal assistance       Functional mobility during ADLs: Minimal assistance;Rolling walker General ADL Comments: pt educated on safety in the bathroom.     Vision       Perception     Praxis      Cognition Arousal/Alertness: Awake/alert Behavior During Therapy: WFL for tasks assessed/performed Overall Cognitive Status:  Impaired/Different from baseline Area of Impairment: Safety/judgement;Awareness;Problem solving                         Safety/Judgement: Decreased awareness of safety;Decreased awareness of deficits Awareness: Emergent Problem Solving: Slow  processing;Difficulty sequencing General Comments: able to recall 2 out 3 precautions. pt able to progres to eob with mod cues. pt unable to recall room number and even after locating uncertain if correct room. pt needs cues for safety. pt unaware of 11 day admission. pt states being here less than 1 week        Exercises     Shoulder Instructions       General Comments Requires 2.5 L O2 due to desat 85% on RA    Pertinent Vitals/ Pain       Pain Assessment: Faces Faces Pain Scale: Hurts a little bit Pain Location: back Pain Descriptors / Indicators: Guarding Pain Intervention(s): Premedicated before session;Monitored during session;Repositioned  Home Living                                          Prior Functioning/Environment              Frequency  Min 2X/week        Progress Toward Goals  OT Goals(current goals can now be found in the care plan section)  Progress towards OT goals: Progressing toward goals  Acute Rehab OT Goals Patient Stated Goal: to get to where i can go home OT Goal Formulation: With patient Time For Goal Achievement: 12/12/20 Potential to Achieve Goals: Good ADL Goals Pt Will Perform Grooming: sitting;with min guard assist Pt Will Perform Upper Body Bathing: with min assist;sitting Pt Will Perform Lower Body Bathing: sit to/from stand;with min assist Pt Will Perform Lower Body Dressing: sit to/from stand;with min assist Pt Will Transfer to Toilet: bedside commode;with min assist;ambulating Additional ADL Goal #1: pt will complete bed mobility Supervision (A) as precursor to adls.  Plan Discharge plan needs to be updated    Co-evaluation    PT/OT/SLP Co-Evaluation/Treatment: Yes Reason for Co-Treatment: For patient/therapist safety;Necessary to address cognition/behavior during functional activity   OT goals addressed during session: ADL's and self-care;Proper use of Adaptive equipment and DME      AM-PAC OT "6  Clicks" Daily Activity     Outcome Measure   Help from another person eating meals?: A Little Help from another person taking care of personal grooming?: A Little Help from another person toileting, which includes using toliet, bedpan, or urinal?: A Lot Help from another person bathing (including washing, rinsing, drying)?: A Lot Help from another person to put on and taking off regular upper body clothing?: A Little Help from another person to put on and taking off regular lower body clothing?: A Lot 6 Click Score: 15    End of Session Equipment Utilized During Treatment: Gait belt  OT Visit Diagnosis: Unsteadiness on feet (R26.81);Other abnormalities of gait and mobility (R26.89);Muscle weakness (generalized) (M62.81);Other symptoms and signs involving cognitive function;Pain   Activity Tolerance Patient tolerated treatment well   Patient Left in chair;with call bell/phone within reach;with chair alarm set;with family/visitor present   Nurse Communication Mobility status;Precautions        Time: 0102-7253 OT Time Calculation (min): 32 min  Charges: OT General Charges $OT Visit: 1 Visit OT Treatments $Self Care/Home Management : 8-22 mins  Fleeta Emmer, OTR/L  Acute Rehabilitation Services Pager: [phone] Office: [phone] .   Jeri Modena 12/04/2020, 1:07 PM

## 2020-12-04 NOTE — Progress Notes (Signed)
Subjective: Patient reports still some mild back pain but doing ok  Objective: Vital signs in last 24 hours: Temp:  [97.3 F (36.3 C)-98.4 F (36.9 C)] 98.2 F (36.8 C) (09/27 0400) Pulse Rate:  [64-88] 71 (09/27 0700) Resp:  [6-20] 11 (09/27 0700) BP: (86-139)/(52-85) 131/68 (09/27 0700) SpO2:  [92 %-100 %] 98 % (09/27 0747) Weight:  [95.8 kg-96.8 kg] 96.8 kg (09/27 0500)  Intake/Output from previous day: 09/26 0701 - 09/27 0700 In: 526.1 [I.V.:46.2; IV Piggyback:479.9] Out: 1750 [Urine:1750] Intake/Output this shift: No intake/output data recorded.  Neurologic: Grossly normal  Lab Results: Lab Results  Component Value Date   WBC 4.8 12/04/2020   HGB 8.5 (L) 12/04/2020   HCT 25.5 (L) 12/04/2020   MCV 94.4 12/04/2020   PLT 229 12/04/2020   Lab Results  Component Value Date   INR 0.9 10/07/2018   BMET Lab Results  Component Value Date   NA 136 12/04/2020   K 3.3 (L) 12/04/2020   CL 94 (L) 12/04/2020   CO2 34 (H) 12/04/2020   GLUCOSE 104 (H) 12/04/2020   BUN <5 (L) 12/04/2020   CREATININE 0.68 12/04/2020   CALCIUM 7.7 (L) 12/04/2020    Studies/Results: ECHO TEE  Result Date: 12/03/2020    TRANSESOPHOGEAL ECHO REPORT   Patient Name:   Danny Hampton Date of Exam: 12/03/2020 Medical Rec #:  [id number]            Height:       64.0 in Accession #:    [id number]           Weight:       211.2 lb Date of Birth:  1955-06-20            BSA:          2.002 m Patient Age:    65 years             BP:           139/81 mmHg Patient Gender: M                    HR:           74 bpm. Exam Location:  Inpatient Procedure: Transesophageal Echo, Cardiac Doppler and Color Doppler Indications:     Bacteremia  History:         Patient has prior history of Echocardiogram examinations, most                  recent 11/25/2020. Arrythmias:Atrial Flutter,                  Signs/Symptoms:Bacteremia and MSSA, resp. failure; Risk                  Factors:Hypertension and Dyslipidemia.   Sonographer:     Dustin Flock RDCS Referring Phys:  [phone] Ria Comment B ROBERTS Diagnosing Phys: Candee Furbish MD PROCEDURE: The transesophogeal probe was passed without difficulty through the esophogus of the patient. Sedation performed by different physician. The patient was monitored while under deep sedation. Anesthestetic sedation was provided intravenously by Anesthesiology: 105.38mg  of Propofol. The patient's vital signs; including heart rate, blood pressure, and oxygen saturation; remained stable throughout the procedure. The patient developed no complications during the procedure. IMPRESSIONS  1. Left ventricular ejection fraction, by estimation, is 60 to 65%. The left ventricle has normal function. The left ventricle has no regional wall motion abnormalities.  2. Right ventricular systolic function is normal. The right ventricular  size is normal.  3. No left atrial/left atrial appendage thrombus was detected.  4. The mitral valve is normal in structure. Trivial mitral valve regurgitation. No evidence of mitral stenosis.  5. The aortic valve is normal in structure. Aortic valve regurgitation is not visualized. No aortic stenosis is present.  6. The inferior vena cava is normal in size with greater than 50% respiratory variability, suggesting right atrial pressure of 3 mmHg. Conclusion(s)/Recommendation(s): Normal biventricular function without evidence of hemodynamically significant valvular heart disease. No evidence of vegetation/infective endocarditis on this transesophageal echocardiogram. FINDINGS  Left Ventricle: Left ventricular ejection fraction, by estimation, is 60 to 65%. The left ventricle has normal function. The left ventricle has no regional wall motion abnormalities. The left ventricular internal cavity size was normal in size. There is  no left ventricular hypertrophy. Right Ventricle: The right ventricular size is normal. No increase in right ventricular wall thickness. Right ventricular  systolic function is normal. Left Atrium: Left atrial size was normal in size. No left atrial/left atrial appendage thrombus was detected. Right Atrium: Right atrial size was normal in size. Pericardium: There is no evidence of pericardial effusion. Mitral Valve: The mitral valve is normal in structure. Trivial mitral valve regurgitation. No evidence of mitral valve stenosis. Tricuspid Valve: The tricuspid valve is normal in structure. Tricuspid valve regurgitation is not demonstrated. No evidence of tricuspid stenosis. Aortic Valve: The aortic valve is normal in structure. Aortic valve regurgitation is not visualized. No aortic stenosis is present. Pulmonic Valve: The pulmonic valve was normal in structure. Pulmonic valve regurgitation is not visualized. No evidence of pulmonic stenosis. Aorta: The aortic root is normal in size and structure. Venous: The inferior vena cava is normal in size with greater than 50% respiratory variability, suggesting right atrial pressure of 3 mmHg. IAS/Shunts: No atrial level shunt detected by color flow Doppler. Candee Furbish MD Electronically signed by Candee Furbish MD Signature Date/Time: 12/03/2020/2:50:03 PM    Final     Assessment/Plan: S/p wound thoracic wound washout. Doing ok, wound vac was changed yesterday. Will defer to wound care nurse for next dressing change. Continue abx per ID. No new nsgy recom.    LOS: 10 days    Ocie Cornfield [facility] 12/04/2020, 7:57 AM

## 2020-12-04 NOTE — Progress Notes (Addendum)
PROGRESS NOTE    Danny Hampton  QPR:[id number] DOB: 08/01/1955 DOA: 11/23/2020 PCP: [facility]   Chief Complaint  Patient presents with   Constipation   Brief Narrative:  65 yo with hx GERD, dysrhythmia, HTN, HLD, myotonia congenita, T2DM, and recent lumbar decompression and extension of posterior spinal instrumented fusion (discharged 8/27) who presented to [facility] on 9/17 with intractable abdominal pain and was found to have an ileus.  He was transferred to [facility] for neurosurgery evaluation and management in the setting of MSSA bacteremia and post operative wound infection.  He's now s/p debridement of postoperative wound infection on 9/20 and he was transferred to the ICU on this date.  Hospitalization was complicated by atrial fibrillation with RVR.    Transferred to hospitalist 9/24.  His NG has been d/c'd.  Ileus improving.  GI now signed off.   TEE 9/26 without evidence of endocarditis.   Surgery, PCCM, cardiology, and gastroenterology have signed off.  ID and neurosurgery are following.   Will need rehab, likely.    Assessment & Plan:   Principal Problem:   Staphylococcus aureus bacteremia Active Problems:   Acute respiratory failure with hypoxia (HCC)   GERD (gastroesophageal reflux disease)   Myotonia congenita   Hyperlipidemia   Intractable abdominal pain   Abdominal distension   Generalized abdominal pain   Postoperative wound infection   Ileus, postoperative (HCC)   Acute exacerbation of chronic low back pain   Ileus (HCC)   Abnormal transaminases   Demoralization  MSSA Bacteremia  Postoperative Lumbar Wound Infection S/p debridement of postop lumbar wound infection 9/20 9/17 cx show MSSA bacteremia 9/18 wound cx and 9/20 soft tissue abscess with MSSA 9//18 blood cx no growth Echo 9/18 with no RWMA, mildly reduced RVSF - no evidence of valvular vegetations Plan for TEE on 9/26 - no endocarditis/vegetation (follow final report) ID  c/s, appreciate recs - planning for nafcillin x2 weeks, then cefazolin to conclude 6 weeks, followed by prolonged course of oral abx - will need outpatient ID follow up Wound vac in place, per NSGY, appreciate recommendations  Ileus NG removed ADAT to soft from fulls  KUB 9/23 with persistent air distended large and small bowel loops throughout abdomen continue reglan (will taper this to prn over next couple of days), minimize narcotics - now signed off, no need for outpatient GI follow up  Appreciate GI recommendations - (9/24 note from Dr. Carlean Purl)  Atrial Fibrillation with RVR Diltiazem 240, coreg 25 mg BID Cardiology c/s, appreciate recs Not currently candidate for anticoagulation -> would follow up with cardiology to discuss long term anticoagulation plan?  Cardiology now signed off 9/26 - transitioned to long acting cardizem  Hypertension BP improved - lisinopril and HCTZ on hold Hold lisinopril.  Continue coreg, dilt HCTZ on hold  HLD Continue lipitor Continue aspirin  Myotonia Congenita He notes sinemet and dilantin for this  RLS ropinirole  Hypokalemia  Hypomagnesemia Replace, follow  Suicidal Statement Psychiatry c/s, appreciate recommendations -> recommended routine psych follow up (thought low risk of suicide).  Recommended decrease paroxetine to 20 mg BID. Continue paxil, ativan prn hydroxyzine  DVT prophylaxis: heparin Code Status: full Family Communication: sig other at bedside Disposition:   Status is: Inpatient  Remains inpatient appropriate because:Inpatient level of care appropriate due to severity of illness  Dispo: The patient is from:  home              Anticipated d/c is to:  pending  Patient currently is not medically stable to d/c.   Difficult to place patient No       Consultants:  Surgery IG [facility] PCCM ID GI  Procedures:  Debridement of postoperative lumbar wound infection 9/20  Echo IMPRESSIONS      1. Left ventricular ejection fraction, by estimation, is 60 to 65%. The  left ventricle has normal function. The left ventricle has no regional  wall motion abnormalities. There is mild concentric left ventricular  hypertrophy. Left ventricular diastolic  parameters were normal.   2. Right ventricular systolic function is mildly reduced. The right  ventricular size is moderately enlarged. Tricuspid regurgitation signal is  inadequate for assessing PA pressure.   3. The mitral valve is grossly normal. Trivial mitral valve  regurgitation. No evidence of mitral stenosis.   4. The aortic valve is tricuspid. Aortic valve regurgitation is not  visualized. No aortic stenosis is present.   5. The inferior vena cava is normal in size with <50% respiratory  variability, suggesting right atrial pressure of 8 mmHg.   Comparison(s): Changes from prior study are noted. LV function remains  unchanged. RV appears moderately dilated with mildly reduced function  which is new from prior study.   Conclusion(s)/Recommendation(s): No evidence of valvular vegetations on  this transthoracic echocardiogram. Would recommend Tinisha Etzkorn transesophageal  echocardiogram to exclude infective endocarditis if clinically indicated.  Echo IMPRESSIONS     1. Left ventricular ejection fraction, by estimation, is 60 to 65%. The  left ventricle has normal function. The left ventricle has no regional  wall motion abnormalities.   2. Right ventricular systolic function is normal. The right ventricular  size is normal.   3. No left atrial/left atrial appendage thrombus was detected.   4. The mitral valve is normal in structure. Trivial mitral valve  regurgitation. No evidence of mitral stenosis.   5. The aortic valve is normal in structure. Aortic valve regurgitation is  not visualized. No aortic stenosis is present.   6. The inferior vena cava is normal in size with greater than 50%  respiratory variability, suggesting  right atrial pressure of 3 mmHg.   Conclusion(s)/Recommendation(s): Normal biventricular function without  evidence of hemodynamically significant valvular heart disease. No  evidence of vegetation/infective endocarditis on this transesophageal  echocardiogram.  Antimicrobials:  Anti-infectives (From admission, onward)    Start     Dose/Rate Route Frequency Ordered Stop   11/27/20 2145  ceFAZolin (ANCEF) IVPB 2g/100 mL premix        2 g 200 mL/hr over 30 Minutes Intravenous Every 8 hours 11/27/20 2052 11/28/20 0619   11/27/20 1200  nafcillin 12 g in sodium chloride 0.9 % 500 mL continuous infusion        12 g 20.8 mL/hr over 24 Hours Intravenous Every 24 hours 11/27/20 1007     11/25/20 1400  ceFAZolin (ANCEF) IVPB 2g/100 mL premix  Status:  Discontinued        2 g 200 mL/hr over 30 Minutes Intravenous Every 8 hours 11/25/20 0915 11/27/20 1007   11/24/20 1400  piperacillin-tazobactam (ZOSYN) IVPB 3.375 g  Status:  Discontinued       See Hyperspace for full Linked Orders Report.   3.375 g 12.5 mL/hr over 240 Minutes Intravenous Every 8 hours 11/24/20 0755 11/25/20 0915   11/24/20 0800  piperacillin-tazobactam (ZOSYN) IVPB 3.375 g       See Hyperspace for full Linked Orders Report.   3.375 g 100 mL/hr over 30 Minutes Intravenous  Once  11/24/20 0755 11/24/20 0925       Subjective:  Asking about eventual d/c plans  Objective: Vitals:   12/04/20 0615 12/04/20 0700 12/04/20 0747 12/04/20 0800  BP: (!) 116/55 131/68  (!) 142/71  Pulse: 71 71  88  Resp: 19 11  20   Temp:    99 F (37.2 C)  TempSrc:      SpO2: 98% 96% 98% 96%  Weight:      Height:        Intake/Output Summary (Last 24 hours) at 12/04/2020 0949 Last data filed at 12/04/2020 0800 Gross per 24 hour  Intake 747.14 ml  Output 1420 ml  Net -672.86 ml   Filed Weights   12/03/20 0500 12/03/20 0921 12/04/20 0500  Weight: 95.8 kg 95.8 kg 96.8 kg    Examination:  General: No acute distress. Cardiovascular:  RRR Lungs: unlabored, CTAB Abdomen: improving mild distension, no pain . Neurological: Alert and oriented 3. Moves all extremities 4 w. Cranial nerves II through XII grossly intact. Skin: back ot examined today. Extremities: No clubbing or cyanosis. No edema.    Data Reviewed: I have personally reviewed following labs and imaging studies  CBC: Recent Labs  Lab 11/30/20 0522 12/01/20 0904 12/02/20 0430 12/03/20 0400 12/04/20 0448  WBC 6.7 7.2 6.4 6.2 4.8  NEUTROABS 4.5 4.8 4.3 5.6 3.0  HGB 9.5* 8.6* 8.6* 8.7* 8.5*  HCT 27.7* 26.0* 25.5* 26.2* 25.5*  MCV 92.6 93.5 92.7 94.2 94.4  PLT 164 197 227 248 341    Basic Metabolic Panel: Recent Labs  Lab 11/28/20 0418 11/29/20 0436 12/01/20 0904 12/02/20 0430 12/03/20 0400 12/04/20 0448  NA 135 131* 134* 135 138 136  K 3.7 3.9 3.4* 2.9* 3.4* 3.3*  CL 97* 96* 98 97* 97* 94*  CO2 22 24 26 29  34* 34*  GLUCOSE 181* 133* 120* 125* 136* 104*  BUN 16 20 11  5* <5* <5*  CREATININE 1.08 0.92 0.93 0.74 0.68 0.68  CALCIUM 8.4* 8.3* 7.9* 7.8* 7.9* 7.7*  MG 1.9  --   --  1.5* 1.8 2.0  PHOS 2.5  --   --  5.9* 3.3 3.2    GFR: Estimated Creatinine Clearance (by C-G formula based on SCr of 0.68 mg/dL) Male: 79.1 mL/min Male: 96.6 mL/min  Liver Function Tests: Recent Labs  Lab 11/29/20 0436 12/01/20 0904 12/02/20 0430 12/03/20 0400 12/04/20 0448  AST 145* 25 21 21 21   ALT 13 13 8 24  <5  ALKPHOS 50 39 39 41 46  BILITOT 1.9* 1.5* 1.1 1.3* 1.3*  PROT 5.3* 4.8* 4.6* 4.7* 4.7*  ALBUMIN 2.1* 1.7* 1.6* 1.7* 1.6*    CBG: Recent Labs  Lab 12/03/20 1543 12/03/20 1935 12/03/20 2318 12/04/20 0314 12/04/20 0747  GLUCAP 138* 165* 111* 102* 118*     Recent Results (from the past 240 hour(s))  MRSA Next Gen by PCR, Nasal     Status: None   Collection Time: 11/24/20  3:30 PM   Specimen: Nasal Mucosa; Nasal Swab  Result Value Ref Range Status   MRSA by PCR Next Gen NOT DETECTED NOT DETECTED Final    Comment: (NOTE) The  GeneXpert MRSA Assay (FDA approved for NASAL specimens only), is one component of Labib Cwynar comprehensive MRSA colonization surveillance program. It is not intended to diagnose MRSA infection nor to guide or monitor treatment for MRSA infections. Test performance is not FDA approved in patients less than 61 years old. Performed at [facility], [address], [place], [place] [address]  Aerobic Culture w Gram Stain (superficial specimen)     Status: None   Collection Time: 11/25/20 11:00 AM   Specimen: Back; Wound  Result Value Ref Range Status   Specimen Description   Final    BACK Performed at [facility], [address], [place], [place] [address]    Special Requests   Final    Normal Performed at [facility], [address], [place], [place] [address]    Gram Stain   Final    NO WBC SEEN FEW GRAM POSITIVE COCCI Performed at [facility], [place] [address] [place], [place] [address]    Culture ABUNDANT STAPHYLOCOCCUS AUREUS  Final   Report Status 11/29/2020 FINAL  Final   Organism ID, Bacteria STAPHYLOCOCCUS AUREUS  Final      Susceptibility   Staphylococcus aureus - MIC*    CIPROFLOXACIN <=0.5 SENSITIVE Sensitive     ERYTHROMYCIN <=0.25 SENSITIVE Sensitive     GENTAMICIN <=0.5 SENSITIVE Sensitive     OXACILLIN <=0.25 SENSITIVE Sensitive     TETRACYCLINE <=1 SENSITIVE Sensitive     VANCOMYCIN 1 SENSITIVE Sensitive     TRIMETH/SULFA <=10 SENSITIVE Sensitive     CLINDAMYCIN <=0.25 SENSITIVE Sensitive     RIFAMPIN <=0.5 SENSITIVE Sensitive     Inducible Clindamycin NEGATIVE Sensitive     * ABUNDANT STAPHYLOCOCCUS AUREUS  Culture, blood (routine x 2)     Status: None   Collection Time: 11/25/20  3:42 PM   Specimen: BLOOD LEFT FOREARM  Result Value Ref Range Status   Specimen Description   Final    BLOOD LEFT FOREARM BOTTLES DRAWN AEROBIC AND ANAEROBIC   Special Requests Blood Culture adequate volume  Final   Culture   Final    NO GROWTH 5 DAYS Performed at [facility], [address], [place], [place] [address]    Report Status 11/30/2020 FINAL  Final  Culture, blood (routine x 2)     Status: None   Collection Time: 11/25/20  3:55 PM   Specimen: Right Antecubital; Blood  Result Value Ref Range Status   Specimen Description   Final    RIGHT ANTECUBITAL BOTTLES DRAWN AEROBIC AND ANAEROBIC   Special Requests Blood Culture adequate volume  Final   Culture   Final    NO GROWTH 5 DAYS Performed at [facility], [address], [place], [place] [address]    Report Status 11/30/2020 FINAL  Final  Aerobic Culture w Gram Stain (superficial specimen)     Status: None   Collection Time: 11/27/20  1:41 PM   Specimen: Wound  Result Value Ref Range Status   Specimen Description WOUND  Final   Special Requests SOFT TISSUE ABCESS  Final   Gram Stain   Final    ABUNDANT WBC PRESENT,BOTH PMN AND MONONUCLEAR RARE GRAM POSITIVE COCCI Performed at [facility], [place] [address], [place], [place] [address]    Culture FEW STAPHYLOCOCCUS AUREUS  Final   Report Status 11/29/2020 FINAL  Final   Organism ID, Bacteria STAPHYLOCOCCUS AUREUS  Final      Susceptibility   Staphylococcus aureus - MIC*    CIPROFLOXACIN <=0.5 SENSITIVE Sensitive     ERYTHROMYCIN <=0.25 SENSITIVE Sensitive     GENTAMICIN <=0.5 SENSITIVE Sensitive     OXACILLIN <=0.25 SENSITIVE Sensitive     TETRACYCLINE <=1 SENSITIVE Sensitive     VANCOMYCIN 1 SENSITIVE Sensitive     TRIMETH/SULFA <=10 SENSITIVE Sensitive     CLINDAMYCIN <=0.25 SENSITIVE Sensitive  RIFAMPIN <=0.5 SENSITIVE Sensitive     Inducible Clindamycin NEGATIVE Sensitive     * FEW STAPHYLOCOCCUS AUREUS  Surgical PCR screen     Status: Abnormal   Collection Time: 11/27/20  1:58 PM   Specimen: Nasal Mucosa; Nasal Swab  Result Value Ref Range Status   MRSA, PCR NEGATIVE NEGATIVE Final   Staphylococcus aureus POSITIVE ([facility]) NEGATIVE Final    Comment: (NOTE) The Xpert SA Assay (FDA approved for NASAL specimens in patients  74 years of age and older), is one component of Cyd Hostler comprehensive surveillance program. It is not intended to diagnose infection nor to guide or monitor treatment. Performed at [facility], [place] [address], [place], [place] [address]   Surgical pcr screen     Status: None   Collection Time: 12/03/20  8:49 AM   Specimen: Nasal Mucosa; Nasal Swab  Result Value Ref Range Status   MRSA, PCR NEGATIVE NEGATIVE Final   Staphylococcus aureus NEGATIVE NEGATIVE Final    Comment: (NOTE) The Xpert SA Assay (FDA approved for NASAL specimens in patients 16 years of age and older), is one component of Keondra Haydu comprehensive surveillance program. It is not intended to diagnose infection nor to guide or monitor treatment. Performed at [facility], [place] [address] [place], [place] [address]          Radiology Studies: ECHO TEE  Result Date: 12/03/2020    TRANSESOPHOGEAL ECHO REPORT   Patient Name:   JEYDAN BARNER Woolworth Date of Exam: 12/03/2020 Medical Rec #:  [id number]            Height:       64.0 in Accession #:    [id number]           Weight:       211.2 lb Date of Birth:  12/04/1955            BSA:          2.002 m Patient Age:    9 years             BP:           139/81 mmHg Patient Gender: M                    HR:           74 bpm. Exam Location:  Inpatient Procedure: Transesophageal Echo, Cardiac Doppler and Color Doppler Indications:     Bacteremia  History:         Patient has prior history of Echocardiogram examinations, most                  recent 11/25/2020. Arrythmias:Atrial Flutter,                  Signs/Symptoms:Bacteremia and MSSA, resp. failure; Risk                  Factors:Hypertension and Dyslipidemia.  Sonographer:     Dustin Flock RDCS Referring Phys:  [phone] Ria Comment B ROBERTS Diagnosing Phys: Candee Furbish MD PROCEDURE: The transesophogeal probe was passed without difficulty through the esophogus of the patient. Sedation performed by different physician. The patient was  monitored while under deep sedation. Anesthestetic sedation was provided intravenously by Anesthesiology: 105.38mg  of Propofol. The patient's vital signs; including heart rate, blood pressure, and oxygen saturation; remained stable throughout the procedure. The patient developed no complications during the procedure. IMPRESSIONS  1. Left ventricular ejection fraction, by estimation, is  60 to 65%. The left ventricle has normal function. The left ventricle has no regional wall motion abnormalities.  2. Right ventricular systolic function is normal. The right ventricular size is normal.  3. No left atrial/left atrial appendage thrombus was detected.  4. The mitral valve is normal in structure. Trivial mitral valve regurgitation. No evidence of mitral stenosis.  5. The aortic valve is normal in structure. Aortic valve regurgitation is not visualized. No aortic stenosis is present.  6. The inferior vena cava is normal in size with greater than 50% respiratory variability, suggesting right atrial pressure of 3 mmHg. Conclusion(s)/Recommendation(s): Normal biventricular function without evidence of hemodynamically significant valvular heart disease. No evidence of vegetation/infective endocarditis on this transesophageal echocardiogram. FINDINGS  Left Ventricle: Left ventricular ejection fraction, by estimation, is 60 to 65%. The left ventricle has normal function. The left ventricle has no regional wall motion abnormalities. The left ventricular internal cavity size was normal in size. There is  no left ventricular hypertrophy. Right Ventricle: The right ventricular size is normal. No increase in right ventricular wall thickness. Right ventricular systolic function is normal. Left Atrium: Left atrial size was normal in size. No left atrial/left atrial appendage thrombus was detected. Right Atrium: Right atrial size was normal in size. Pericardium: There is no evidence of pericardial effusion. Mitral Valve: The mitral  valve is normal in structure. Trivial mitral valve regurgitation. No evidence of mitral valve stenosis. Tricuspid Valve: The tricuspid valve is normal in structure. Tricuspid valve regurgitation is not demonstrated. No evidence of tricuspid stenosis. Aortic Valve: The aortic valve is normal in structure. Aortic valve regurgitation is not visualized. No aortic stenosis is present. Pulmonic Valve: The pulmonic valve was normal in structure. Pulmonic valve regurgitation is not visualized. No evidence of pulmonic stenosis. Aorta: The aortic root is normal in size and structure. Venous: The inferior vena cava is normal in size with greater than 50% respiratory variability, suggesting right atrial pressure of 3 mmHg. IAS/Shunts: No atrial level shunt detected by color flow Doppler. Candee Furbish MD Electronically signed by Candee Furbish MD Signature Date/Time: 12/03/2020/2:50:03 PM    Final         Scheduled Meds:  acetaminophen  1,000 mg Oral Q8H   aspirin  81 mg Oral Daily   bisacodyl  10 mg Rectal Daily   budesonide (PULMICORT) nebulizer solution  0.25 mg Nebulization BID   Carbidopa-Levodopa ER  1 tablet Oral QID   carvedilol  25 mg Oral BID WC   Chlorhexidine Gluconate Cloth  6 each Topical Daily   diltiazem  240 mg Oral Daily   docusate sodium  100 mg Oral BID   heparin  5,000 Units Subcutaneous Q8H   insulin aspart  0-9 Units Subcutaneous Q4H   loratadine  10 mg Oral Daily   magnesium oxide  400 mg Oral BID   metoCLOPramide (REGLAN) injection  10 mg Intravenous Q12H   pantoprazole  40 mg Oral Q1200   PARoxetine  20 mg Oral BID   phenytoin  100 mg Oral TID   [START ON 12/05/2020] polyethylene glycol  17 g Oral Daily   potassium chloride  40 mEq Oral Q4H   rOPINIRole  0.5 mg Oral 2 times per day   senna  1 tablet Oral BID   sodium chloride flush  10-40 mL Intracatheter Q12H   sodium chloride flush  3 mL Intravenous Q12H   Continuous Infusions:  sodium chloride Stopped (12/02/20 2235)    sodium chloride Stopped (12/04/20 0453)  sodium chloride Stopped (12/03/20 1827)   methocarbamol (ROBAXIN) IV Stopped (12/02/20 2152)   nafcillin (NAFCIL) continuous infusion 20.8 mL/hr at 12/04/20 0800     LOS: 10 days    Time spent: over 30 min    Fayrene Helper, MD [facility]   To contact the attending provider between 7A-7P or the covering provider during after hours 7P-7A, please log into the web site [email or web address] and access using universal  password for that web site. If you do not have the password, please call the hospital operator.  12/04/2020, 9:49 AM

## 2020-12-04 NOTE — Progress Notes (Signed)
Physical Therapy Treatment Patient Details Name: Danny Hampton MRN: [id number] DOB: 07-01-1955 Today's Date: 12/04/2020   History of Present Illness 65 y/o male presented to [facility] on 9/16 with complaints of abdominal distention and constipation. CT showed mildly dilated small bowel and some colonic distension with air-fluid level concerning for ileus. Transferred to [facility] for further evaluation by neurosurgery due to his initial lumbar decompression on 10/31/20 due to purulent drainage from thoracic incision and blood cultures positive for MSSA. Developed new onset Afib RVR. Patient s/p debridement of postoperative lumbar wound infection on 9/20. PMH: GERD, HTN, myotonia congenita, DM, dysrhythmia, HLD, acid reflux, anxiety.    PT Comments    Patient with excellent progress with mobility and pain control this session.  He remains unsteady and needing assist his significant other cannot provide.  Feel he continues to need SNF level rehab at d/c prior to home so he can be independent prior to home.     Recommendations for follow up therapy are one component of a multi-disciplinary discharge planning process, led by the attending physician.  Recommendations may be updated based on patient status, additional functional criteria and insurance authorization.  Follow Up Recommendations  SNF     Equipment Recommendations  Rolling walker with 5" wheels    Recommendations for Other Services       Precautions / Restrictions Precautions Precautions: Fall;Back Precaution Comments: wound vac, no brace needed per order,     Mobility  Bed Mobility Overal bed mobility: Needs Assistance Bed Mobility: Rolling;Supine to Sit Rolling: Min guard   Supine to sit: Min assist     General bed mobility comments: pt exiting on the L side and needs cues to sequence and how to maintain back precautions. pt able to initiate pushing with L UE this session. pt static sitting min guard (A)     Transfers Overall transfer level: Needs assistance Equipment used: Rolling walker (2 wheeled) Transfers: Sit to/from Stand Sit to Stand: Min assist         General transfer comment: pt reliant on RW and needs (A) for line management. pt noted to desat 85% RA  Ambulation/Gait Ambulation/Gait assistance: Min assist;+2 safety/equipment Gait Distance (Feet): 100 Feet Assistive device: Rolling walker (2 wheeled) Gait Pattern/deviations: Step-through pattern;Decreased stride length;Wide base of support     General Gait Details: painful with ambulation and on RA desat, but difficulty reading on R hand using walker, followed with chair as pt relates back and legs feel weak, but able to get to window down the hall and back to room to bathroom prior to needing seated rest.   Stairs             Wheelchair Mobility    Modified Rankin (Stroke Patients Only)       Balance Overall balance assessment: Needs assistance Sitting-balance support: Bilateral upper extremity supported;Feet supported Sitting balance-Leahy Scale: Good Sitting balance - Comments: seated at EOB   Standing balance support: Bilateral upper extremity supported;During functional activity Standing balance-Leahy Scale: Poor Standing balance comment: reliant on UE support and external assist                            Cognition Arousal/Alertness: Awake/alert Behavior During Therapy: WFL for tasks assessed/performed Overall Cognitive Status: Impaired/Different from baseline Area of Impairment: Safety/judgement;Awareness;Problem solving                         Safety/Judgement:  Decreased awareness of safety;Decreased awareness of deficits Awareness: Emergent Problem Solving: Slow processing;Difficulty sequencing General Comments: able to recall 2 out 3 precautions. pt able to progres to eob with mod cues. pt unable to recall room number and even after locating uncertain if correct room.  pt needs cues for safety. pt unaware of 11 day admission. pt states being here less than 1 week      Exercises      General Comments General comments (skin integrity, edema, etc.): desat on RA 83%, SpO2 low 90's back on 2L O2      Pertinent Vitals/Pain Pain Assessment: Faces Pain Score: 7  Faces Pain Scale: Hurts a little bit Pain Location: back Pain Descriptors / Indicators: Tightness;Aching Pain Intervention(s): Monitored during session;Repositioned;Premedicated before session    Home Living                      Prior Function            PT Goals (current goals can now be found in the care plan section) Acute Rehab PT Goals Patient Stated Goal: to get to where i can go home Progress towards PT goals: Progressing toward goals    Frequency    Min 3X/week      PT Plan Current plan remains appropriate    Co-evaluation PT/OT/SLP Co-Evaluation/Treatment: Yes Reason for Co-Treatment: Necessary to address cognition/behavior during functional activity;To address functional/ADL transfers;For patient/therapist safety PT goals addressed during session: Mobility/safety with mobility;Balance;Proper use of DME OT goals addressed during session: ADL's and self-care;Proper use of Adaptive equipment and DME      AM-PAC PT "6 Clicks" Mobility   Outcome Measure  Help needed turning from your back to your side while in a flat bed without using bedrails?: A Little Help needed moving from lying on your back to sitting on the side of a flat bed without using bedrails?: A Lot Help needed moving to and from a bed to a chair (including a wheelchair)?: A Little Help needed standing up from a chair using your arms (e.g., wheelchair or bedside chair)?: A Little Help needed to walk in hospital room?: A Lot Help needed climbing 3-5 steps with a railing? : Total 6 Click Score: 14    End of Session Equipment Utilized During Treatment: Gait belt Activity Tolerance: Patient limited  by fatigue Patient left: in chair;with call bell/phone within reach;with family/visitor present   PT Visit Diagnosis: Unsteadiness on feet (R26.81);Other abnormalities of gait and mobility (R26.89);Muscle weakness (generalized) (M62.81)     Time: 5053-9767 PT Time Calculation (min) (ACUTE ONLY): 32 min  Charges:  $Gait Training: 8-22 mins                     Magda Kiel, PT Acute Rehabilitation Services HALPF:[phone] Office:[phone] 12/04/2020    Reginia Naas 12/04/2020, 3:48 PM

## 2020-12-04 NOTE — TOC Progression Note (Addendum)
Transition of Care [facility]) - Progression Note    Patient Details  Name: Danny Hampton MRN: [id number] Date of Birth: 1955-03-14  Transition [facility]) CM/SW [place], LCSW Phone Number: 12/04/2020, 8:50 AM  Clinical Narrative:    8:50am-CSW received call back from [place] with the [facility]. She reported that patient is not service connected and therefore would need to use his Medicare Part A benefits. She has discussed with patient's significant other that home care can be provided through the [place] once the patient returns home. Patient does not currently qualify for Medicaid either.   CSW spoke with patient and Juliann Pulse. Patient stated he would really like to discharge home instead of SNF. Juliann Pulse expressed concerns about doing so however. CSW explained that if patient is ready in the next few days, they may want to consider SNF especially if he requires the wound vac and IV abx. CSW provided SNF options. They selected [facility] but would like to hear from MD on when he will be ready to discharge.   3pm-[facility] has ordered wound vac which should be there by tomorrow. CSW requested COVID test.   Patient's significant other updated and will go to the facility to complete admission paperwork in the morning.    Expected Discharge Plan: Skilled Nursing Facility Barriers to Discharge: Ship broker, Continued Medical Work up, SNF Pending bed offer  Expected Discharge Plan and Services Expected Discharge Plan: [place] In-house Referral: Clinical Social Work   Post Acute Care Choice: [place] Living arrangements for the past 2 months: Single Family Home                           HH Arranged: IV Antibiotics HH Agency: [facility] Date HH Agency Contacted: 11/25/20 Time [place]: 59 Representative spoke with at [place]: [place] ([place]) Interventions     Readmission Risk Interventions No flowsheet data found.

## 2020-12-05 ENCOUNTER — Encounter (HOSPITAL_COMMUNITY): Payer: Self-pay | Admitting: Cardiology

## 2020-12-05 DIAGNOSIS — R0600 Dyspnea, unspecified: Secondary | ICD-10-CM

## 2020-12-05 DIAGNOSIS — R0682 Tachypnea, not elsewhere classified: Secondary | ICD-10-CM

## 2020-12-05 DIAGNOSIS — R453 Demoralization and apathy: Secondary | ICD-10-CM

## 2020-12-05 LAB — GLUCOSE, CAPILLARY
Glucose-Capillary: 110 mg/dL — ABNORMAL HIGH (ref 70–99)
Glucose-Capillary: 122 mg/dL — ABNORMAL HIGH (ref 70–99)
Glucose-Capillary: 129 mg/dL — ABNORMAL HIGH (ref 70–99)
Glucose-Capillary: 103 mg/dL — ABNORMAL HIGH (ref 70–99)
Glucose-Capillary: 120 mg/dL — ABNORMAL HIGH (ref 70–99)
Glucose-Capillary: 158 mg/dL — ABNORMAL HIGH (ref 70–99)

## 2020-12-05 LAB — CBC WITH DIFFERENTIAL/PLATELET
Abs Immature Granulocytes: 0.28 10*3/uL — ABNORMAL HIGH (ref 0.00–0.07)
Basophils Absolute: 0 10*3/uL (ref 0.0–0.1)
Basophils Relative: 1 %
Eosinophils Absolute: 0.1 10*3/uL (ref 0.0–0.5)
Eosinophils Relative: 1 %
HCT: 26.2 % — ABNORMAL LOW (ref 39.0–52.0)
Hemoglobin: 8.6 g/dL — ABNORMAL LOW (ref 13.0–17.0)
Immature Granulocytes: 4 %
Lymphocytes Relative: 11 %
Lymphs Abs: 0.8 10*3/uL (ref 0.7–4.0)
MCH: 31.2 pg (ref 26.0–34.0)
MCHC: 32.8 g/dL (ref 30.0–36.0)
MCV: 94.9 fL (ref 80.0–100.0)
Monocytes Absolute: 0.8 10*3/uL (ref 0.1–1.0)
Monocytes Relative: 11 %
Neutro Abs: 5.1 10*3/uL (ref 1.7–7.7)
Neutrophils Relative %: 72 %
Platelets: 243 10*3/uL (ref 150–400)
RBC: 2.76 MIL/uL — ABNORMAL LOW (ref 4.22–5.81)
RDW: 13.9 % (ref 11.5–15.5)
WBC: 7.1 10*3/uL (ref 4.0–10.5)
nRBC: 0 % (ref 0.0–0.2)

## 2020-12-05 LAB — COMPREHENSIVE METABOLIC PANEL
ALT: 6 U/L (ref 0–44)
AST: 19 U/L (ref 15–41)
Albumin: 1.7 g/dL — ABNORMAL LOW (ref 3.5–5.0)
Alkaline Phosphatase: 50 U/L (ref 38–126)
Anion gap: 7 (ref 5–15)
BUN: <5 mg/dL — ABNORMAL LOW (ref 8–23)
CO2: 33 mmol/L — ABNORMAL HIGH (ref 22–32)
Calcium: 7.7 mg/dL — ABNORMAL LOW (ref 8.9–10.3)
Chloride: 95 mmol/L — ABNORMAL LOW (ref 98–111)
Creatinine, Ser: 0.68 mg/dL (ref 0.61–1.24)
GFR, Estimated: >60 mL/min (ref >60)
Glucose, Bld: 121 mg/dL — ABNORMAL HIGH (ref 70–99)
Potassium: 3.5 mmol/L (ref 3.5–5.1)
Sodium: 135 mmol/L (ref 135–145)
Total Bilirubin: 1.2 mg/dL (ref 0.3–1.2)
Total Protein: 5 g/dL — ABNORMAL LOW (ref 6.5–8.1)

## 2020-12-05 LAB — PHOSPHORUS: Phosphorus: 3 mg/dL (ref 2.5–4.6)

## 2020-12-05 LAB — MAGNESIUM: Magnesium: 1.8 mg/dL (ref 1.7–2.4)

## 2020-12-05 MED ORDER — GABAPENTIN 600 MG PO TABS
300.0000 mg | ORAL_TABLET | Freq: Every day | ORAL | Status: DC
  Administered 2020-12-05 – 2020-12-06 (×2): 300 mg via ORAL
  Filled 2020-12-05 (×2): qty 1

## 2020-12-05 MED ORDER — THIAMINE HCL 100 MG PO TABS
100.0000 mg | ORAL_TABLET | Freq: Every day | ORAL | Status: DC
Start: 2020-12-06 — End: 2020-12-07
  Administered 2020-12-06 – 2020-12-07 (×2): 100 mg via ORAL
  Filled 2020-12-05 (×2): qty 1

## 2020-12-05 NOTE — Plan of Care (Signed)

## 2020-12-05 NOTE — Plan of Care (Signed)
  Problem: Education: Goal: Knowledge of General Education information will improve Description Including pain rating scale, medication(s)/side effects and non-pharmacologic comfort measures Outcome: Progressing   

## 2020-12-05 NOTE — TOC Progression Note (Addendum)
Transition of Care [facility]) - Progression Note    Patient Details  Name: Danny Hampton MRN: [id number] Date of Birth: 1955/12/21  Transition of [facility]) CM/SW Contact  Sharlet [place]ina Mila [place], LCSW Phone Number: 12/05/2020, 3:26 PM  Clinical Narrative:  CSW talked with Percell Locus, [place] who was working with patient for SNF placement on another unit. Per Percell Locus, patient not service connected with the [place], so is using his Medicare A to d/c to [facility].  CSW received a call from [place], [place] facility liaison regarding patient's discharge. She was informed that patient will discharge on Thursday (9/29). She advised CSW that they have a wound vac for patient. Allsion advised that CSW will follow-up with her on Thursday, 9/29.    Expected Discharge Plan: [facility]  Barriers to Discharge: Ship broker, Continued Medical Work up, SNF Pending bed offer  Expected Discharge Plan and Services Expected Discharge Plan: [place] In-house Referral: Clinical Social Work   Post Acute Care Choice: [place] Living arrangements for the past 2 months: Single Family Home                           HH Arranged: IV Antibiotics HH Agency: [facility] Date HH Agency Contacted: 11/25/20 Time [place]: 87 Representative spoke with at [place]: [place] Determinants of Health ([place]) Interventions  No SDOH interventions requested or needed at this time.  Readmission Risk Interventions No flowsheet data found.

## 2020-12-05 NOTE — Progress Notes (Signed)
[facility] for Infectious Disease    Date of Admission:  11/23/2020   Total days of antibiotics           ID: Danny Hampton is a 65 y.o. adult with staph aureus bacteremia, post op wound infection Principal Problem:   Staphylococcus aureus bacteremia Active Problems:   Acute respiratory failure with hypoxia (HCC)   GERD (gastroesophageal reflux disease)   Myotonia congenita   Hyperlipidemia   Intractable abdominal pain   Abdominal distension   Generalized abdominal pain   Postoperative wound infection   Ileus, postoperative (HCC)   Acute exacerbation of chronic low back pain   Ileus (HCC)   Abnormal transaminases   Demoralization    Subjective: Muscle relaxant improved pain management. Had significant pain last night. Has concern about disposition and which nursing home he will go to. Has not walked with PT today. Denies chills/fever/diarrhea  Medications:   acetaminophen  1,000 mg Oral Q8H   aspirin  81 mg Oral Daily   bisacodyl  10 mg Rectal Daily   budesonide (PULMICORT) nebulizer solution  0.25 mg Nebulization BID   Carbidopa-Levodopa ER  1 tablet Oral QID   carvedilol  25 mg Oral BID WC   Chlorhexidine Gluconate Cloth  6 each Topical Daily   diltiazem  240 mg Oral Daily   docusate sodium  100 mg Oral BID   heparin  5,000 Units Subcutaneous Q8H   insulin aspart  0-9 Units Subcutaneous Q4H   loratadine  10 mg Oral Daily   magnesium oxide  400 mg Oral BID   metoCLOPramide  5 mg Oral TID AC   pantoprazole  40 mg Oral Q1200   PARoxetine  20 mg Oral BID   phenytoin  100 mg Oral TID   polyethylene glycol  17 g Oral Daily   rOPINIRole  0.5 mg Oral 2 times per day   senna  1 tablet Oral BID   sodium chloride flush  10-40 mL Intracatheter Q12H    Objective: Vital signs in last 24 hours: Temp:  [97.8 F (36.6 C)-98.4 F (36.9 C)] 97.9 F (36.6 C) (09/28 0927) Pulse Rate:  [68-87] 68 (09/28 0927) Resp:  [14-19] 18 (09/28 0927) BP: (109-152)/(53-89)  135/53 (09/28 0927) SpO2:  [96 %-100 %] 97 % (09/28 0927)  Physical Exam  Constitutional: He is oriented to person, place, and time. He appears well-developed and well-nourished. No distress.  HENT:  Mouth/Throat: Oropharynx is clear and moist. No oropharyngeal exudate.  Cardiovascular: Normal rate, regular rhythm and normal heart sounds. Exam reveals no gallop and no friction rub.  No murmur heard.  Pulmonary/Chest: Effort normal and breath sounds normal. No respiratory distress. He has no wheezes.  Abdominal: Soft. Bowel sounds are normal. He exhibits no distension. There is no tenderness.  Back= wound vac in place Neurological: He is alert and oriented to person, place, and time.  Skin: Skin is warm and dry. No rash noted. No erythema.  Psychiatric: He has a normal mood and affect. His behavior is normal.    Lab Results Recent Labs    12/04/20 0448 12/05/20 0457  WBC 4.8 7.1  HGB 8.5* 8.6*  HCT 25.5* 26.2*  NA 136 135  K 3.3* 3.5  CL 94* 95*  CO2 34* 33*  BUN <5* <5*  CREATININE 0.68 0.68   Liver Panel Recent Labs    12/04/20 0448 12/05/20 0457  PROT 4.7* 5.0*  ALBUMIN 1.6* 1.7*  AST 21 19  ALT <5  6  ALKPHOS 46 50  BILITOT 1.3* 1.2   Lab Results  Component Value Date   ESRSEDRATE 73 (H) 11/26/2020    Microbiology: reviewed Studies/Results: No results found.   Assessment/Plan: MSSA bacteremia secondary to post op wound infection (T10-L3 laminectomy on august 14th), s/p wash out on 9/20. Now on nafcillin. Patient is to transition to SNF tomorrow. We will switch to cefazolin for ease of abtx administration. Will place opat orders for 6 wks and have him follow up in the ID clinic at that time to decide if need transition to oral abtx as well. Using day 1 abtx on 9/21.  [facility] for Infectious Diseases Pager: [phone]  12/05/2020, 4:48 PM

## 2020-12-05 NOTE — Consult Note (Signed)
[facility] New Psychiatric Evaluation   Service Date: December 05, 2020 LOS:  LOS: 11 days    Assessment  Danny Hampton is a 65 y.o. adult admitted medically for 11/23/2020  7:37 PM for abdominal pain; he was later found to have an ileus. He carries the psychiatric diagnoses of generalized anxiety disorder and has a past medical history of  acid reflux, dysrhythmia, essential hypertension, high cholesterol, myotonia congenita, type 2 diabetes mellitus .Psychiatry was consulted for parasuicidal statements.     His current presentation of feeling like his current life is not worth living (while maintaining hope he gets better) is most consistent with demoralization syndrome. He meets many of the criteria for MDD although this is closely intertwined with overall health status. Current outpatient psychotropic medications include paroxetine and lorazepam and historically he has had a fair response to these medications. He was generally compliant with medications prior to admission. On initial examination, patient had seen no improvement when paroxetine was titrated from 20 to 60 mg over the past several months. We will work towards reducing paroxetine for several reasons; chief among them its sedating, anticholinergic, and constipating tendencies in this pt with recent hx ICU delirium and ileus. Will likely not make further decreases this hospitalization due to risk for discontinuation syndrome (paroxetine is quite short acting). Please see plan below for detailed recommendations.   9/28: pt with ups and downs in mood largely 2/2 ups and downs with medical condition. Messaged hosptialist re: thoughts on gabapentin (medically complicated older pt, on several sedating agents, want to discuss before starting). Would wait ~1 week before further downtitration of paroxetine.   Diagnoses:  Active Hospital problems: Principal Problem:   Staphylococcus aureus bacteremia Active  Problems:   Acute respiratory failure with hypoxia (HCC)   GERD (gastroesophageal reflux disease)   Myotonia congenita   Hyperlipidemia   Intractable abdominal pain   Abdominal distension   Generalized abdominal pain   Postoperative wound infection   Ileus, postoperative (HCC)   Acute exacerbation of chronic low back pain   Ileus (HCC)   Abnormal transaminases   Demoralization    Problems edited/added by me: No problems updated.    Plan  ## Safety and Observation Level:  - Based on my clinical evaluation, I estimate the patient to be at low risk of suicide in the current setting - At this time, we recommend routine psychiatric followup. This decision is based on my review of the chart including patient's history and current presentation, interview of the patient, mental status examination, and consideration of suicide risk including evaluating suicidal ideation, plan, intent, suicidal or self-harm behaviors, risk factors, and protective factors. This judgment is based on our ability to directly address suicide risk, implement suicide prevention strategies and develop a safety plan while the patient is in the clinical setting. Please contact our team if there is a concern that risk level has changed.  ## Medications:  Standing  -- continue paroxetine at to 20 mg BID   - will consider decrease to 10/20 next week  PRN  -- no changes to current PRN regimen (hydroxyzine as first line with lorazepam available)   ## Medical Decision Making Capacity:  Not specifically assessed this hospitalization  ## Further Work-up:  -- per primary team  ## Disposition:  -- no psychiatric contraindication to discharge when medically appropriate   Thank you for this consult request. Recommendations have been communicated to the primary team.  We will continue to follow at  this time.  [place] A Naren Benally    NEW vs followup history  Relevant Aspects of Hospital Course:  Admitted on  11/23/2020 for postoperative ileus; was found to be positive for MSSA bacteremia for which he is on nafcillin; further complicated by afib with RVR to 1702.   Patient Report:  Patient seen in early afternoon.   Patient is alert and oriented. Relates ups and downs in mood over last week 2/2 medical and financial status. Glad he has his partner to advocate with [facility] to [facility] for [facility] and SNF. Fixated on only having 20 days approved at [facility] - discussed if he is not ready to go home they can reapply for more days - difficulty dealing with uncertainty. Some occasional thoughts of hopelessness, no thoughts of suicide since prior to last eval "I've come to far to go back now". Thankful for psychiatry's continued involvement. Discussed that I will reach out to medical team about options to help pt sleep - want to discuss prior to starting due to polypharmacy and comorbidities. Open to trial of gabapentin in evening only (priorly had taken throughout day and too sedating).  ROS:  Mostly significant for back pain radiating, unchanged since this AM.  Collateral information:  Spoke to his girlfriend in initial consult  Psychiatric History:  See initial consult.   Medical History: Past Medical History:  Diagnosis Date  . Acid reflux   . Anxiety   . Arrhythmia    paroxysmal supreventricular tachycardia  . Arthritis   . Complication of anesthesia    Myotonia congenita  . Dysrhythmia   . Essential hypertension   . Family history of adverse reaction to anesthesia    Sister  . High cholesterol   . History of skin cancer   . Myotonia congenita   . Type 2 diabetes mellitus [facility])     Surgical History: Past Surgical History:  Procedure Laterality Date  . APPLICATION OF WOUND VAC  11/27/2020   Procedure: APPLICATION OF WOUND VAC;  Surgeon: Kristeen Miss, MD;  Location: [place];  Service: Neurosurgery;;  . BACK SURGERY     times 2  . CARPAL TUNNEL RELEASE    . CATARACT EXTRACTION W/PHACO Left  01/31/2019   Procedure: CATARACT EXTRACTION PHACO AND INTRAOCULAR LENS PLACEMENT (IOC);  Surgeon: Baruch Goldmann, MD;  Location: [facility];  Service: Ophthalmology;  Laterality: Left;  CDE: 2.66  . LUMBAR WOUND DEBRIDEMENT N/A 11/27/2020   Procedure: LUMBAR WOUND DEBRIDEMENT AND WASHOUT;  Surgeon: Kristeen Miss, MD;  Location: [place];  Service: Neurosurgery;  Laterality: N/A;  . NOSE SURGERY    . TEE WITHOUT CARDIOVERSION N/A 12/03/2020   Procedure: TRANSESOPHAGEAL ECHOCARDIOGRAM (TEE);  Surgeon: Jerline Pain, MD;  Location: [facility]COPY;  Service: Cardiovascular;  Laterality: N/A;  . TOTAL KNEE ARTHROPLASTY Right 10/12/2018   Procedure: RIGHT TOTAL KNEE ARTHROPLASTY;  Surgeon: Garald Balding, MD;  Location: [facility];  Service: Orthopedics;  Laterality: Right;    Medications:   Current Facility-Administered Medications:  .  0.9 %  sodium chloride infusion, , Intravenous, Continuous, Cheryln Manly, NP, Stopped at 12/04/20 [phone] .  acetaminophen (TYLENOL) tablet 1,000 mg, 1,000 mg, Oral, Q8H, 1,000 mg at 12/05/20 1411 **FOLLOWED BY** [START ON 12/06/2020] acetaminophen (TYLENOL) tablet 650 mg, 650 mg, Oral, Q6H PRN, Elodia Florence., MD .  albuterol (PROVENTIL) (2.5 MG/3ML) 0.083% nebulizer solution 2.5 mg, 2.5 mg, Nebulization, Q2H PRN, Minor, Grace Bushy, NP, 2.5 mg at 11/28/20 1949 .  aspirin chewable tablet 81  mg, 81 mg, Oral, Daily, Minor, Grace Bushy, NP, 81 mg at 12/05/20 1040 .  bisacodyl (DULCOLAX) suppository 10 mg, 10 mg, Rectal, Daily, Meuth, Brooke A, PA-C, 10 mg at 12/02/20 1035 .  budesonide (PULMICORT) nebulizer solution 0.25 mg, 0.25 mg, Nebulization, BID, Chand, Sudham, MD, 0.25 mg at 12/05/20 0856 .  Carbidopa-Levodopa ER 48.75-195 MG CPCR 1 tablet, 1 tablet, Oral, QID, Elodia Florence., MD, 1 tablet at 12/05/20 1512 .  carvedilol (COREG) tablet 25 mg, 25 mg, Oral, BID WC, Chand, Sudham, MD, 25 mg at 12/05/20 1646 .  Chlorhexidine Gluconate Cloth 2 % PADS 6 each, 6 each,  Topical, Daily, Minor, Grace Bushy, NP, 6 each at 12/05/20 1052 .  diltiazem (CARDIZEM CD) 24 hr capsule 240 mg, 240 mg, Oral, Daily, Troy Sine, MD, 240 mg at 12/05/20 1040 .  docusate sodium (COLACE) capsule 100 mg, 100 mg, Oral, BID, Kristeen Miss, MD, 100 mg at 12/04/20 0919 .  heparin injection 5,000 Units, 5,000 Units, Subcutaneous, Q8H, Minor, Grace Bushy, NP, 5,000 Units at 12/05/20 1411 .  HYDROmorphone (DILAUDID) injection 0.5-1 mg, 0.5-1 mg, Intravenous, Q3H PRN, Minor, Grace Bushy, NP, 1 mg at 12/05/20 1647 .  hydrOXYzine (ATARAX/VISTARIL) tablet 25 mg, 25 mg, Oral, TID PRN, Minor, Grace Bushy, NP, 25 mg at 12/03/20 2100 .  insulin aspart (novoLOG) injection 0-9 Units, 0-9 Units, Subcutaneous, Q4H, Jacky Kindle, MD, 1 Units at 12/04/20 1601 .  loratadine (CLARITIN) tablet 10 mg, 10 mg, Oral, Daily, Minor, Grace Bushy, NP, 10 mg at 12/05/20 1040 .  LORazepam (ATIVAN) injection 0.5 mg, 0.5 mg, Intravenous, TID PRN, Minor, Grace Bushy, NP, 0.5 mg at 12/04/20 2213 .  magnesium oxide (MAG-OX) tablet 400 mg, 400 mg, Oral, BID, Elodia Florence., MD, 400 mg at 12/05/20 1040 .  menthol-cetylpyridinium (CEPACOL) lozenge 3 mg, 1 lozenge, Oral, PRN **OR** phenol (CHLORASEPTIC) mouth spray 1 spray, 1 spray, Mouth/Throat, PRN, Kristeen Miss, MD .  methocarbamol (ROBAXIN) 500 mg in dextrose 5 % 50 mL IVPB, 500 mg, Intravenous, Q8H PRN, Elodia Florence., MD, Stopped at 12/05/20 [phone] .  metoCLOPramide (REGLAN) tablet 5 mg, 5 mg, Oral, TID AC, 5 mg at 12/05/20 1647 **FOLLOWED BY** [DISCONTINUED] metoCLOPramide (REGLAN) tablet 5 mg, 5 mg, Oral, Q8H PRN, Elodia Florence., MD .  nafcillin 12 g in sodium chloride 0.9 % 500 mL continuous infusion, 12 g, Intravenous, Q24H, Minor, Grace Bushy, NP, Last Rate: 20.8 mL/hr at 12/05/20 1651, Infusion Verify at 12/05/20 1651 .  ondansetron (ZOFRAN) tablet 4 mg, 4 mg, Oral, Q6H PRN **OR** ondansetron (ZOFRAN) injection 4 mg, 4 mg, Intravenous, Q6H PRN, Kristeen Miss, MD .  oxyCODONE (Oxy IR/ROXICODONE) immediate release tablet 10 mg, 10 mg, Oral, Q6H PRN, Minor, Grace Bushy, NP, 10 mg at 12/05/20 1040 .  pantoprazole (PROTONIX) EC tablet 40 mg, 40 mg, Oral, Q1200, Elodia Florence., MD, 40 mg at 12/05/20 1155 .  PARoxetine (PAXIL) tablet 20 mg, 20 mg, Oral, BID, Elodia Florence., MD, 20 mg at 12/05/20 1040 .  phenytoin (DILANTIN) ER capsule 100 mg, 100 mg, Oral, TID, Elodia Florence., MD, 100 mg at 12/05/20 1647 .  polyethylene glycol (MIRALAX / GLYCOLAX) packet 17 g, 17 g, Oral, Daily, Florene Glen, A Clint Lipps., MD .  rOPINIRole (REQUIP) tablet 0.5 mg, 0.5 mg, Oral, 2 times per day, Elodia Florence., MD, 0.5 mg at 12/04/20 2218 .  senna (SENOKOT) tablet 8.6 mg, 1 tablet, Oral, BID, Florene Glen,  A Clint Lipps., MD, 8.6 mg at 12/05/20 1040 .  sodium chloride flush (NS) 0.9 % injection 10-40 mL, 10-40 mL, Intracatheter, Q12H, Jacky Kindle, MD, 10 mL at 12/04/20 0922 .  sodium chloride flush (NS) 0.9 % injection 10-40 mL, 10-40 mL, Intracatheter, PRN, Jacky Kindle, MD .  sodium phosphate (FLEET) 7-19 GM/118ML enema 1 enema, 1 enema, Rectal, Once PRN, Kristeen Miss, MD .  Derrill Memo ON 12/06/2020] thiamine tablet 100 mg, 100 mg, Oral, Daily, Tashya Alberty A  Allergies: No Known Allergies  Social History:  Lives with girlfriend of 30 years  The patient's family history includes Cancer in his father; Healthy in his father and mother.    Objective  Vital signs:  Temp:  [97.9 F (36.6 C)-98.4 F (36.9 C)] 97.9 F (36.6 C) (09/28 0927) Pulse Rate:  [68-71] 68 (09/28 0927) Resp:  [14-19] 18 (09/28 0927) BP: (130-152)/(53-89) 135/53 (09/28 0927) SpO2:  [97 %-100 %] 97 % (09/28 0927)  Physical Exam: General: lying in bed, ill appearing, minimal spontaneous movements Respiratory: no increased work of breathing  Neuro/MSK: deferred gait due to debility, no obvious tremors, CNII-XII grossly intact Skin: pale Win[place] when moves.    Mental status exam On my mental status examination presently, the patient is alert, and grossly oriented to person, place, and time.  He is attentive to exam. Appearance:  Calm, ill-appearing, pale, appears stated age.  Speech spontaneous and normal in volume, rate, and rhythm.  Mood:  "It was good until my setback".  Affect:  Full.  Thought process:  Linear associations, normal rate, logical.  Thought content is devoid of suicidal, homicidal, violent, and delusional material.  There are no evident hallucinations, he endorsed some visual hallucinations over the past week when delirious.  Judgment is fair.  Insight is fair.  Recent and remote memory:  Grossly intact.  Language is intact.  Fund of knowledge:  Grossly intact.   Data Reviewed: Physician and nursing notes  Additional Psychometric Testing: none

## 2020-12-05 NOTE — Progress Notes (Addendum)
PROGRESS NOTE    Danny Hampton  DUK:[id number] DOB: May 13, 1955 DOA: 11/23/2020 PCP: [facility]    Brief Narrative:   65 years old male with past medical history of GERD, hypertension, hyperlipidemia, myotonia congenita, diabetes mellitus type 2 and recent lumbar decompression and extension of posterior spinal instrumented fusion (discharged 8/27) presented to [facility] on 9/17 with intractable abdominal pain and was noted to be having ileus.  He was then transferred to [facility] for further evaluation and treatment.  Neurosurgery was also consulted and patient was noted to have MRSA bacteremia with postoperative wound infection.  TEE was done on 926 which did not show any evidence of endocarditis.  He underwent debridement on 11/27/2020 and was initially in the ICU.  During hospitalization, patient was seen by surgery, PCCM, cardiology and GI.  Patient had atrial fibrillation with RVR.  Subsequently patient was considered to be stable and transferred to hospitalist service.  Infectious disease and neurosurgery following.  At this time, patient is awaiting for placement at rehabilitation placement.  Assessment & Plan:   Principal Problem:   Staphylococcus aureus bacteremia Active Problems:   Acute respiratory failure with hypoxia (HCC)   GERD (gastroesophageal reflux disease)   Myotonia congenita   Hyperlipidemia   Intractable abdominal pain   Abdominal distension   Generalized abdominal pain   Postoperative wound infection   Ileus, postoperative (HCC)   Acute exacerbation of chronic low back pain   Ileus (HCC)   Abnormal transaminases   Demoralization  MSSA Bacteremia  Postoperative Lumbar Wound Infection Underwent debridement of postoperative lumbar wound infection on 11/27/2020.  Culture showed MSSA bacteremia on 11/24/20 from the soft tissue sample.  Blood cultures negative so far.  Esophageal echocardiogram showed no evidence of endocarditis.  Patient was seen by infectious disease and recommended nafcillin x2 weeks, then cefazolin to conclude 6 weeks, followed by prolonged course of oral antibiotics.  Patient already has a PICC line in place.  Will need outpatient ID follow up as outpatient.  Patient does have wound VAC in place we will continue wound VAC and drain on discharge  Ileus NG tube has been removed.  Was on full liquids.  Will advance to soft diet today.  Patient feels hungry.  He had 2 bowel movements yesterday.  On Reglan will need to taper over the next few days.  Minimize narcotics.  No need for GI follow-up as outpatient as per GI.   Atrial Fibrillation with RVR Currently on Cardizem Coreg.  Not on anticoagulation and would need to follow-up with cardiology as outpatient for anticoagulation plan.  Cardiology has signed off at this time.    Essential hypertension Lisinopril and HCTZ on hold.  Continue Coreg and diltiazem.  Hyperlipidemia Continue lipitor, aspirin  Myotonia Congenita Continue Sinemet and Dilantin  Restless leg syndrome. Continue Requip  Hypokalemia  Hypomagnesemia Improved after replacement  Suicidal Statement Seen by psychiatry.  Recommended decrease paroxetine 20 mg twice daily.  Continue Ativan and hydroxyzine as needed   DVT prophylaxis:  Heparin subcu  Code Status:  Full code  Family Communication:  I spoke with the patient's significant other on the phone and updated her about the clinical condition of the patient.  Disposition:   Status is: Inpatient  Remains inpatient appropriate because:Inpatient level of care appropriate due to severity of illness  Dispo: The patient is from:  home              Anticipated d/c is to: Skilled  nursing facility when bed is available.  Will need wound VAC management, IV antibiotics on discharge.              Patient currently is medically stable to d/c.   Difficult to place patient No  Consultants:   Surgery Neurosurgery, Cardiology PCCM ID GI  Procedures:  Debridement of postoperative lumbar wound infection 11/27/20 2D echocardiogram  Antimicrobials:   Anti-infectives (From admission, onward)    Start     Dose/Rate Route Frequency Ordered Stop   11/27/20 2145  ceFAZolin (ANCEF) IVPB 2g/100 mL premix        2 g 200 mL/hr over 30 Minutes Intravenous Every 8 hours 11/27/20 2052 11/28/20 0619   11/27/20 1200  nafcillin 12 g in sodium chloride 0.9 % 500 mL continuous infusion        12 g 20.8 mL/hr over 24 Hours Intravenous Every 24 hours 11/27/20 1007     11/25/20 1400  ceFAZolin (ANCEF) IVPB 2g/100 mL premix  Status:  Discontinued        2 g 200 mL/hr over 30 Minutes Intravenous Every 8 hours 11/25/20 0915 11/27/20 1007   11/24/20 1400  piperacillin-tazobactam (ZOSYN) IVPB 3.375 g  Status:  Discontinued       See Hyperspace for full Linked Orders Report.   3.375 g 12.5 mL/hr over 240 Minutes Intravenous Every 8 hours 11/24/20 0755 11/25/20 0915   11/24/20 0800  piperacillin-tazobactam (ZOSYN) IVPB 3.375 g       See Hyperspace for full Linked Orders Report.   3.375 g 100 mL/hr over 30 Minutes Intravenous  Once 11/24/20 0755 11/24/20 0925      Subjective: Today, patient was seen and examined at bedside.  States that he did have a bowel movement yesterday and feels little hungry today.  He complained of some spasms over his back with back and lower leg pain.  Objective: Vitals:   12/04/20 2036 12/05/20 0446 12/05/20 0858 12/05/20 0927  BP:  130/89  (!) 135/53  Pulse:  71  68  Resp:  19  18  Temp:  98.4 F (36.9 C)  97.9 F (36.6 C)  TempSrc:      SpO2: 99% 99% 99% 97%  Weight:      Height:        Intake/Output Summary (Last 24 hours) at 12/05/2020 1126 Last data filed at 12/05/2020 1000 Gross per 24 hour  Intake 702.37 ml  Output 850 ml  Net -147.63 ml    Filed Weights   12/03/20 0500 12/03/20 0921 12/04/20 0500  Weight: 95.8 kg 95.8 kg 96.8 kg   Body  mass index is 36.63 kg/m.   Physical examination: General: Obese built, not in obvious distress, on nasal cannula oxygen, HENT:   No scleral pallor or icterus noted. Oral mucosa is moist.  Chest:  Clear breath sounds.  Diminished breath sounds bilaterally. No crackles or wheezes.  Wound VAC on the back with drain CVS: S1 &S2 heard. No murmur.  Regular rate and rhythm. Abdomen: Soft, nontender, nondistended.  Obese.  Bowel sounds are heard.   Extremities: No cyanosis, clubbing or edema.  Peripheral pulses are palpable.  Right upper extremity PICC line in place Psych: Alert, awake and oriented, normal mood CNS:  No cranial nerve deficits.  Power equal in all extremities.   Skin: Warm and dry.  No rashes noted.  Data Reviewed:  I have personally reviewed the following labs and imaging studies.    CBC: Recent Labs  Lab 12/01/20 [phone]  12/02/20 0430 12/03/20 0400 12/04/20 0448 12/05/20 0457  WBC 7.2 6.4 6.2 4.8 7.1  NEUTROABS 4.8 4.3 5.6 3.0 5.1  HGB 8.6* 8.6* 8.7* 8.5* 8.6*  HCT 26.0* 25.5* 26.2* 25.5* 26.2*  MCV 93.5 92.7 94.2 94.4 94.9  PLT 197 227 248 229 243     Basic Metabolic Panel: Recent Labs  Lab 12/01/20 0904 12/02/20 0430 12/03/20 0400 12/04/20 0448 12/05/20 0457  NA 134* 135 138 136 135  K 3.4* 2.9* 3.4* 3.3* 3.5  CL 98 97* 97* 94* 95*  CO2 26 29 34* 34* 33*  GLUCOSE 120* 125* 136* 104* 121*  BUN 11 5* <5* <5* <5*  CREATININE 0.93 0.74 0.68 0.68 0.68  CALCIUM 7.9* 7.8* 7.9* 7.7* 7.7*  MG  --  1.5* 1.8 2.0 1.8  PHOS  --  5.9* 3.3 3.2 3.0     GFR: Estimated Creatinine Clearance (by C-G formula based on SCr of 0.68 mg/dL) Male: 79.1 mL/min Male: 96.6 mL/min  Liver Function Tests: Recent Labs  Lab 12/01/20 0904 12/02/20 0430 12/03/20 0400 12/04/20 0448 12/05/20 0457  AST 25 21 21 21 19   ALT 13 8 24  <5 6  ALKPHOS 39 39 41 46 50  BILITOT 1.5* 1.1 1.3* 1.3* 1.2  PROT 4.8* 4.6* 4.7* 4.7* 5.0*  ALBUMIN 1.7* 1.6* 1.7* 1.6* 1.7*      CBG: Recent Labs  Lab 12/04/20 1551 12/04/20 2013 12/04/20 2346 12/05/20 0447 12/05/20 0842  GLUCAP 143* 122* 129* 122* 110*     Recent Results (from the past 240 hour(s))  Culture, blood (routine x 2)     Status: None   Collection Time: 11/25/20  3:42 PM   Specimen: BLOOD LEFT FOREARM  Result Value Ref Range Status   Specimen Description   Final    BLOOD LEFT FOREARM BOTTLES DRAWN AEROBIC AND ANAEROBIC   Special Requests Blood Culture adequate volume  Final   Culture   Final    NO GROWTH 5 DAYS Performed at [facility], [address], [place], [place] [address]    Report Status 11/30/2020 FINAL  Final  Culture, blood (routine x 2)     Status: None   Collection Time: 11/25/20  3:55 PM   Specimen: Right Antecubital; Blood  Result Value Ref Range Status   Specimen Description   Final    RIGHT ANTECUBITAL BOTTLES DRAWN AEROBIC AND ANAEROBIC   Special Requests Blood Culture adequate volume  Final   Culture   Final    NO GROWTH 5 DAYS Performed at [facility], [address] [place], [place] [address]    Report Status 11/30/2020 FINAL  Final  Aerobic Culture w Gram Stain (superficial specimen)     Status: None   Collection Time: 11/27/20  1:41 PM   Specimen: Wound  Result Value Ref Range Status   Specimen Description WOUND  Final   Special Requests SOFT TISSUE ABCESS  Final   Gram Stain   Final    ABUNDANT WBC PRESENT,BOTH PMN AND MONONUCLEAR RARE GRAM POSITIVE COCCI Performed at [facility], [address] [place], [place] [address]    Culture FEW STAPHYLOCOCCUS AUREUS  Final   Report Status 11/29/2020 FINAL  Final   Organism ID, Bacteria STAPHYLOCOCCUS AUREUS  Final      Susceptibility   Staphylococcus aureus - MIC*    CIPROFLOXACIN <=0.5 SENSITIVE Sensitive     ERYTHROMYCIN <=0.25 SENSITIVE Sensitive     GENTAMICIN <=0.5 SENSITIVE Sensitive     OXACILLIN <=0.25 SENSITIVE  Sensitive     TETRACYCLINE <=1 SENSITIVE Sensitive     VANCOMYCIN 1  SENSITIVE Sensitive     TRIMETH/SULFA <=10 SENSITIVE Sensitive     CLINDAMYCIN <=0.25 SENSITIVE Sensitive     RIFAMPIN <=0.5 SENSITIVE Sensitive     Inducible Clindamycin NEGATIVE Sensitive     * FEW STAPHYLOCOCCUS AUREUS  Surgical PCR screen     Status: Abnormal   Collection Time: 11/27/20  1:58 PM   Specimen: Nasal Mucosa; Nasal Swab  Result Value Ref Range Status   MRSA, PCR NEGATIVE NEGATIVE Final   Staphylococcus aureus POSITIVE (A) NEGATIVE Final    Comment: (NOTE) The Xpert SA Assay (FDA approved for NASAL specimens in patients 30 years of age and older), is one component of a comprehensive surveillance program. It is not intended to diagnose infection nor to guide or monitor treatment. Performed at [facility], [place] [address], [place], [place] [address]   Surgical pcr screen     Status: None   Collection Time: 12/03/20  8:49 AM   Specimen: Nasal Mucosa; Nasal Swab  Result Value Ref Range Status   MRSA, PCR NEGATIVE NEGATIVE Final   Staphylococcus aureus NEGATIVE NEGATIVE Final    Comment: (NOTE) The Xpert SA Assay (FDA approved for NASAL specimens in patients 54 years of age and older), is one component of a comprehensive surveillance program. It is not intended to diagnose infection nor to guide or monitor treatment. Performed at [facility], [place] [address], [place], [place] [address]   Resp Panel by RT-PCR (Flu A&B, Covid) Nasopharyngeal Swab     Status: None   Collection Time: 12/04/20  5:38 PM   Specimen: Nasopharyngeal Swab; Nasopharyngeal(NP) swabs in vial transport medium  Result Value Ref Range Status   SARS Coronavirus 2 by RT PCR NEGATIVE NEGATIVE Final    Comment: (NOTE) SARS-CoV-2 target nucleic acids are NOT DETECTED.  The SARS-CoV-2 RNA is generally detectable in upper respiratory specimens during the acute phase of infection. The lowest concentration of SARS-CoV-2 viral copies this assay can detect is 138 copies/mL. A negative result  does not preclude SARS-Cov-2 infection and should not be used as the sole basis for treatment or other patient management decisions. A negative result may occur with  improper specimen collection/handling, submission of specimen other than nasopharyngeal swab, presence of viral mutation(s) within the areas targeted by this assay, and inadequate number of viral copies(<138 copies/mL). A negative result must be combined with clinical observations, patient history, and epidemiological information. The expected result is Negative.  Fact Sheet for Patients:  EntrepreneurPulse.com.au  Fact Sheet for Healthcare Providers:  IncredibleEmployment.be  This test is no t yet approved or cleared by the [place] FDA and  has been authorized for detection and/or diagnosis of SARS-CoV-2 by FDA under an Emergency Use Authorization (EUA). This EUA will remain  in effect (meaning this test can be used) for the duration of the COVID-19 declaration under Section 564(b)(1) of the Act, 21 U.S.C.section 360bbb-3(b)(1), unless the authorization is terminated  or revoked sooner.       Influenza A by PCR NEGATIVE NEGATIVE Final   Influenza B by PCR NEGATIVE NEGATIVE Final    Comment: (NOTE) The Xpert Xpress SARS-CoV-2/FLU/RSV plus assay is intended as an aid in the diagnosis of influenza from Nasopharyngeal swab specimens and should not be used as a sole basis for treatment. Nasal washings and aspirates are unacceptable for Xpert Xpress SARS-CoV-2/FLU/RSV testing.  Fact Sheet for Patients: EntrepreneurPulse.com.au  Fact Sheet for Healthcare Providers: IncredibleEmployment.be  This test is not yet approved or cleared by the [place] FDA and has been authorized for detection and/or diagnosis of SARS-CoV-2 by FDA under an Emergency Use Authorization (EUA). This EUA will remain in effect (meaning this test can be used) for  the duration of the COVID-19 declaration under Section 564(b)(1) of the Act, 21 U.S.C. section 360bbb-3(b)(1), unless the authorization is terminated or revoked.  Performed at [facility], [place] [address], [place], [place] [address]      Radiology Studies: No results found.  Scheduled Meds:  acetaminophen  1,000 mg Oral Q8H   aspirin  81 mg Oral Daily   bisacodyl  10 mg Rectal Daily   budesonide (PULMICORT) nebulizer solution  0.25 mg Nebulization BID   Carbidopa-Levodopa ER  1 tablet Oral QID   carvedilol  25 mg Oral BID WC   Chlorhexidine Gluconate Cloth  6 each Topical Daily   diltiazem  240 mg Oral Daily   docusate sodium  100 mg Oral BID   heparin  5,000 Units Subcutaneous Q8H   insulin aspart  0-9 Units Subcutaneous Q4H   loratadine  10 mg Oral Daily   magnesium oxide  400 mg Oral BID   metoCLOPramide  5 mg Oral TID AC   pantoprazole  40 mg Oral Q1200   PARoxetine  20 mg Oral BID   phenytoin  100 mg Oral TID   polyethylene glycol  17 g Oral Daily   rOPINIRole  0.5 mg Oral 2 times per day   senna  1 tablet Oral BID   sodium chloride flush  10-40 mL Intracatheter Q12H   sodium chloride flush  3 mL Intravenous Q12H   Continuous Infusions:  sodium chloride Stopped (12/02/20 2235)   sodium chloride Stopped (12/04/20 0453)   sodium chloride Stopped (12/03/20 1827)   methocarbamol (ROBAXIN) IV 500 mg (12/05/20 0507)   nafcillin (NAFCIL) continuous infusion 20.8 mL/hr at 12/04/20 1600     LOS: 11 days    Flora Lipps, MD [facility] 12/05/2020, 11:26 AM

## 2020-12-05 NOTE — Plan of Care (Signed)

## 2020-12-06 LAB — CBC
HCT: 26.4 % — ABNORMAL LOW (ref 39.0–52.0)
Hemoglobin: 8.5 g/dL — ABNORMAL LOW (ref 13.0–17.0)
MCH: 30.9 pg (ref 26.0–34.0)
MCHC: 32.2 g/dL (ref 30.0–36.0)
MCV: 96 fL (ref 80.0–100.0)
Platelets: 230 10*3/uL (ref 150–400)
RBC: 2.75 MIL/uL — ABNORMAL LOW (ref 4.22–5.81)
RDW: 13.9 % (ref 11.5–15.5)
WBC: 7.5 10*3/uL (ref 4.0–10.5)
nRBC: 0 % (ref 0.0–0.2)

## 2020-12-06 LAB — GLUCOSE, CAPILLARY
Glucose-Capillary: 101 mg/dL — ABNORMAL HIGH (ref 70–99)
Glucose-Capillary: 105 mg/dL — ABNORMAL HIGH (ref 70–99)
Glucose-Capillary: 110 mg/dL — ABNORMAL HIGH (ref 70–99)
Glucose-Capillary: 114 mg/dL — ABNORMAL HIGH (ref 70–99)
Glucose-Capillary: 194 mg/dL — ABNORMAL HIGH (ref 70–99)
Glucose-Capillary: 98 mg/dL (ref 70–99)

## 2020-12-06 LAB — BASIC METABOLIC PANEL
Anion gap: 6 (ref 5–15)
BUN: <5 mg/dL — ABNORMAL LOW (ref 8–23)
CO2: 34 mmol/L — ABNORMAL HIGH (ref 22–32)
Calcium: 8 mg/dL — ABNORMAL LOW (ref 8.9–10.3)
Chloride: 97 mmol/L — ABNORMAL LOW (ref 98–111)
Creatinine, Ser: 0.62 mg/dL (ref 0.61–1.24)
GFR, Estimated: >60 mL/min (ref >60)
Glucose, Bld: 107 mg/dL — ABNORMAL HIGH (ref 70–99)
Potassium: 3.3 mmol/L — ABNORMAL LOW (ref 3.5–5.1)
Sodium: 137 mmol/L (ref 135–145)

## 2020-12-06 LAB — MAGNESIUM: Magnesium: 1.8 mg/dL (ref 1.7–2.4)

## 2020-12-06 MED ORDER — OXYCODONE HCL 5 MG PO TABS: 5.0000 mg | ORAL_TABLET | Freq: Four times a day (QID) | ORAL | 0 refills | Status: DC | PRN

## 2020-12-06 MED ORDER — CEFAZOLIN IV (FOR PTA / DISCHARGE USE ONLY): 2.0000 g | Freq: Three times a day (TID) | INTRAVENOUS | 0 refills | Status: AC

## 2020-12-06 MED ORDER — POLYETHYLENE GLYCOL 3350 17 G PO PACK: 17.0000 g | PACK | Freq: Every day | ORAL | 0 refills | Status: DC

## 2020-12-06 MED ORDER — BISACODYL 10 MG RE SUPP: 10.0000 mg | Freq: Every day | RECTAL | 0 refills | Status: DC | PRN

## 2020-12-06 MED ORDER — DILTIAZEM HCL ER COATED BEADS 240 MG PO CP24: 240.0000 mg | ORAL_CAPSULE | Freq: Every day | ORAL | Status: DC

## 2020-12-06 MED ORDER — HYDROXYZINE HCL 25 MG PO TABS: 25.0000 mg | ORAL_TABLET | Freq: Three times a day (TID) | ORAL | 0 refills | Status: DC | PRN

## 2020-12-06 MED ORDER — CEFAZOLIN SODIUM-DEXTROSE 2-4 GM/100ML-% IV SOLN
2.0000 g | Freq: Three times a day (TID) | INTRAVENOUS | Status: DC
  Administered 2020-12-06 – 2020-12-07 (×3): 2 g via INTRAVENOUS
  Filled 2020-12-06 (×5): qty 100

## 2020-12-06 MED ORDER — POTASSIUM CHLORIDE CRYS ER 20 MEQ PO TBCR
40.0000 meq | EXTENDED_RELEASE_TABLET | Freq: Once | ORAL | Status: AC
  Administered 2020-12-06: 40 meq via ORAL
  Filled 2020-12-06: qty 2

## 2020-12-06 MED ORDER — METHOCARBAMOL 500 MG PO TABS: 500.0000 mg | ORAL_TABLET | Freq: Four times a day (QID) | ORAL | Status: AC | PRN

## 2020-12-06 MED ORDER — GABAPENTIN 50 MG PO TABS: 300.0000 mg | ORAL_TABLET | Freq: Every day | ORAL | Status: DC

## 2020-12-06 MED ORDER — LORAZEPAM 1 MG PO TABS: 1.0000 mg | ORAL_TABLET | Freq: Two times a day (BID) | ORAL | 0 refills | Status: AC | PRN

## 2020-12-06 MED ORDER — SENNA 8.6 MG PO TABS: 1.0000 | ORAL_TABLET | Freq: Two times a day (BID) | ORAL | 0 refills | Status: DC

## 2020-12-06 MED ORDER — PAROXETINE HCL 20 MG PO TABS: 20.0000 mg | ORAL_TABLET | Freq: Two times a day (BID) | ORAL | Status: DC

## 2020-12-06 MED ORDER — THIAMINE HCL 100 MG PO TABS: 100.0000 mg | ORAL_TABLET | Freq: Every day | ORAL | Status: DC

## 2020-12-06 MED ORDER — ACETAMINOPHEN 325 MG PO TABS: 650.0000 mg | ORAL_TABLET | Freq: Four times a day (QID) | ORAL | Status: AC | PRN

## 2020-12-06 MED ORDER — CARVEDILOL 25 MG PO TABS: 25.0000 mg | ORAL_TABLET | Freq: Two times a day (BID) | ORAL | Status: DC

## 2020-12-06 MED ORDER — METOCLOPRAMIDE HCL 5 MG PO TABS: ORAL_TABLET | ORAL | Status: DC

## 2020-12-06 MED ORDER — MAGNESIUM OXIDE -MG SUPPLEMENT 400 (240 MG) MG PO TABS: 400.0000 mg | ORAL_TABLET | Freq: Two times a day (BID) | ORAL | Status: AC

## 2020-12-06 NOTE — Progress Notes (Signed)
Physical Therapy Treatment Patient Details Name: Danny Hampton MRN: [id number] DOB: Jun 24, 1955 Today's Date: 12/06/2020   History of Present Illness 65 y/o male presented to [facility] on 9/16 with complaints of abdominal distention and constipation. CT showed mildly dilated small bowel and some colonic distension with air-fluid level concerning for ileus. Transferred to [facility] for further evaluation by neurosurgery due to his initial lumbar decompression on 10/31/20 due to purulent drainage from thoracic incision and blood cultures positive for MSSA. Developed new onset Afib RVR. Patient s/p debridement of postoperative lumbar wound infection on 9/20. PMH: GERD, HTN, myotonia congenita, DM, dysrhythmia, HLD, acid reflux, anxiety.    PT Comments    Pt tolerates treatment well, demonstrating improved transfer quality. Pt continues to benefit from cues to maintain back precautions and requires assistance for ADLs (performing lower body dressing during this session). Pt will benefit from continued acute PT services to improve activity tolerance and restore independence in mobility. PT continues to recommend SNF placement at this time.   Recommendations for follow up therapy are one component of a multi-disciplinary discharge planning process, led by the attending physician.  Recommendations may be updated based on patient status, additional functional criteria and insurance authorization.  Follow Up Recommendations  SNF     Equipment Recommendations  Rolling walker with 5" wheels    Recommendations for Other Services       Precautions / Restrictions Precautions Precautions: Fall;Back Precaution Booklet Issued: No Precaution Comments: no brace needed per orders Restrictions Weight Bearing Restrictions: No     Mobility  Bed Mobility Overal bed mobility: Needs Assistance Bed Mobility: Rolling;Sidelying to Sit;Sit to Sidelying Rolling: Supervision Sidelying to sit: Supervision     Sit  to sidelying: Supervision General bed mobility comments: use of railings    Transfers Overall transfer level: Needs assistance Equipment used: Rolling walker (2 wheeled) Transfers: Sit to/from Stand Sit to Stand: Min guard;From elevated surface         General transfer comment: cues for hand placement  Ambulation/Gait Ambulation/Gait assistance: Min guard Gait Distance (Feet): 150 Feet Assistive device: Rolling walker (2 wheeled) Gait Pattern/deviations: Step-through pattern Gait velocity: Decreased Gait velocity interpretation: 1.31 - 2.62 ft/sec, indicative of limited community ambulator General Gait Details: pt with slowed but steady step-through gait, reduced stride length   Stairs             Wheelchair Mobility    Modified Rankin (Stroke Patients Only)       Balance Overall balance assessment: Needs assistance Sitting-balance support: No upper extremity supported;Feet supported Sitting balance-Leahy Scale: Good     Standing balance support: Bilateral upper extremity supported Standing balance-Leahy Scale: Poor Standing balance comment: reliant on UE support and external assist                            Cognition Arousal/Alertness: Awake/alert Behavior During Therapy: WFL for tasks assessed/performed Overall Cognitive Status: Within Functional Limits for tasks assessed                                        Exercises      General Comments General comments (skin integrity, edema, etc.): VSS on RA      Pertinent Vitals/Pain Pain Assessment: Faces Faces Pain Scale: Hurts a little bit Pain Location: back Pain Descriptors / Indicators: Grimacing Pain Intervention(s): Monitored during session  Home Living                      Prior Function            PT Goals (current goals can now be found in the care plan section) Acute Rehab PT Goals Patient Stated Goal: to get to where i can go home Progress  towards PT goals: Progressing toward goals    Frequency    Min 3X/week      PT Plan Current plan remains appropriate    Co-evaluation              AM-PAC PT "6 Clicks" Mobility   Outcome Measure  Help needed turning from your back to your side while in a flat bed without using bedrails?: A Little Help needed moving from lying on your back to sitting on the side of a flat bed without using bedrails?: A Little Help needed moving to and from a bed to a chair (including a wheelchair)?: A Little Help needed standing up from a chair using your arms (e.g., wheelchair or bedside chair)?: A Little Help needed to walk in hospital room?: A Little Help needed climbing 3-5 steps with a railing? : Total 6 Click Score: 16    End of Session   Activity Tolerance: Patient tolerated treatment well Patient left: in bed;with call bell/phone within reach;with bed alarm set Nurse Communication: Mobility status PT Visit Diagnosis: Unsteadiness on feet (R26.81);Other abnormalities of gait and mobility (R26.89);Muscle weakness (generalized) (M62.81)     Time: 8916-9450 PT Time Calculation (min) (ACUTE ONLY): 20 min  Charges:  $Gait Training: 8-22 mins                     Zenaida Niece, PT, DPT Acute Rehabilitation Pager: [phone]    Zenaida Niece 12/06/2020, 5:25 PM

## 2020-12-06 NOTE — Plan of Care (Signed)
  Problem: Acute Rehab PT Goals(only PT should resolve) Goal: Pt Will Go Supine/Side To Sit Outcome: Adequate for Discharge Goal: Patient Will Transfer Sit To/From Stand Outcome: Adequate for Discharge Goal: Pt Will Transfer Bed To Chair/Chair To Bed Outcome: Adequate for Discharge Goal: Pt Will Ambulate Outcome: Adequate for Discharge   Problem: Education: Goal: Knowledge of General Education information will improve Description: Including pain rating scale, medication(s)/side effects and non-pharmacologic comfort measures 12/06/2020 1516 by Dolores Hoose, RN Outcome: Adequate for Discharge 12/06/2020 5093 by Dolores Hoose, RN Outcome: Progressing   Problem: Health Behavior/Discharge Planning: Goal: Ability to manage health-related needs will improve Outcome: Adequate for Discharge   Problem: Clinical Measurements: Goal: Ability to maintain clinical measurements within normal limits will improve Outcome: Adequate for Discharge Goal: Will remain free from infection Outcome: Adequate for Discharge Goal: Diagnostic test results will improve Outcome: Adequate for Discharge Goal: Respiratory complications will improve Outcome: Adequate for Discharge Goal: Cardiovascular complication will be avoided Outcome: Adequate for Discharge   Problem: Activity: Goal: Risk for activity intolerance will decrease 12/06/2020 1516 by Dolores Hoose, RN Outcome: Adequate for Discharge 12/06/2020 2671 by Dolores Hoose, RN Outcome: Progressing   Problem: Nutrition: Goal: Adequate nutrition will be maintained Outcome: Adequate for Discharge   Problem: Coping: Goal: Level of anxiety will decrease 12/06/2020 1516 by Dolores Hoose, RN Outcome: Adequate for Discharge 12/06/2020 2458 by Dolores Hoose, RN Outcome: Progressing   Problem: Elimination: Goal: Will not experience complications related to bowel motility Outcome: Adequate for Discharge Goal: Will not experience  complications related to urinary retention Outcome: Adequate for Discharge   Problem: Pain Managment: Goal: General experience of comfort will improve 12/06/2020 1516 by Dolores Hoose, RN Outcome: Adequate for Discharge 12/06/2020 0998 by Dolores Hoose, RN Outcome: Progressing   Problem: Safety: Goal: Ability to remain free from injury will improve Outcome: Adequate for Discharge   Problem: Skin Integrity: Goal: Risk for impaired skin integrity will decrease Outcome: Adequate for Discharge   Problem: Acute Rehab PT Goals(only PT should resolve) Goal: Pt Will Go Up/Down Stairs Outcome: Adequate for Discharge   Problem: Acute Rehab OT Goals (only OT should resolve) Goal: Pt. Will Perform Grooming Outcome: Adequate for Discharge Goal: Pt. Will Perform Upper Body Bathing Outcome: Adequate for Discharge Goal: Pt. Will Transfer To Toilet Outcome: Adequate for Discharge Goal: OT Additional ADL Goal #1 Outcome: Adequate for Discharge

## 2020-12-06 NOTE — Consult Note (Signed)
[facility] New Psychiatric Evaluation   Service Date: December 06, 2020 LOS:  LOS: 12 days    Assessment  Danny Hampton is a 65 y.o. adult admitted medically for 11/23/2020  7:37 PM for abdominal pain; he was later found to have an ileus. He carries the psychiatric diagnoses of generalized anxiety disorder and has a past medical history of  acid reflux, dysrhythmia, essential hypertension, high cholesterol, myotonia congenita, type 2 diabetes mellitus .Psychiatry was consulted for parasuicidal statements.     His current presentation of feeling like his current life is not worth living (while maintaining hope he gets better) is most consistent with demoralization syndrome. He meets many of the criteria for MDD although this is closely intertwined with overall health status. Current outpatient psychotropic medications include paroxetine and lorazepam and historically he has had a fair response to these medications. He was generally compliant with medications prior to admission. On initial examination, patient had seen no improvement when paroxetine was titrated from 20 to 60 mg over the past several months without clear improvement. We will work towards reducing paroxetine for several reasons; chief among them its sedating, anticholinergic, and constipating tendencies in this pt with recent hx ICU delirium and ileus. Will likely not make further decreases this hospitalization due to risk for discontinuation syndrome (paroxetine is quite short acting). Please see plan below for detailed recommendations.   9/28: pt with ups and downs in mood largely 2/2 ups and downs with medical condition. Messaged hosptialist re: thoughts on gabapentin (medically complicated older pt, on several sedating agents, want to discuss before starting). Would wait ~1 week before further downtitration of paroxetine.  9/29: asked to see pt by primary team; discussed his fears about going to SNF at length.  Attempted to reframe some of these (best case/worst case/most likely case) and discuss how this aligns with his stated goals. No med changes.  Diagnoses:  Active Hospital problems: Principal Problem:   Staphylococcus aureus bacteremia Active Problems:   Acute respiratory failure with hypoxia (HCC)   GERD (gastroesophageal reflux disease)   Myotonia congenita   Hyperlipidemia   Intractable abdominal pain   Abdominal distension   Generalized abdominal pain   Postoperative wound infection   Ileus, postoperative (HCC)   Acute exacerbation of chronic low back pain   Ileus (HCC)   Abnormal transaminases   Demoralization    Problems edited/added by me: No problems updated.    Plan  ## Safety and Observation Level:  - Based on my clinical evaluation, I estimate the patient to be at low risk of suicide in the current setting - At this time, we recommend routine psychiatric followup. This decision is based on my review of the chart including patient's history and current presentation, interview of the patient, mental status examination, and consideration of suicide risk including evaluating suicidal ideation, plan, intent, suicidal or self-harm behaviors, risk factors, and protective factors. This judgment is based on our ability to directly address suicide risk, implement suicide prevention strategies and develop a safety plan while the patient is in the clinical setting. Please contact our team if there is a concern that risk level has changed.  ## Medications:  Standing  -- continue paroxetine at to 20 mg BID   - will consider decrease to 10/20 next week if still here  PRN  -- no changes to current PRN regimen (hydroxyzine as first line with lorazepam available)   ## Medical Decision Making Capacity:  Not specifically assessed this  hospitalization  ## Further Work-up:  -- per primary team  ## Disposition:  -- no psychiatric contraindication to discharge when medically  appropriate   Thank you for this consult request. Recommendations have been communicated to the primary team.  We will continue to follow at this time.  [place] A Trayson Stitely    NEW vs followup history  Relevant Aspects of Hospital Course:  Admitted on 11/23/2020 for postoperative ileus; was found to be positive for MSSA bacteremia for which he is on nafcillin; further complicated by afib with RVR to 1702.   Patient Report:  Patient seen in early afternoon.   Patient is alert and oriented. As above, we discussed his fears about going to SNF at length. Attempted to reframe some of these (best case/worst case/most likely case) and discuss how this aligns with his stated goals.  Discussed his hopes for discharge (road trip to [place] to see leaves)  We discussed his paroxetine - discussed that would generally decrease next week to which he is agreeable although this will likely be done on an outpt basis. No SI/HI/AH/VH.   ROS:  Pain a little better today, appetite still down. Remainder of full ROS generally (-)  Collateral information:  Spoke to his girlfriend in initial consult  Psychiatric History:  See initial consult.   Medical History: Past Medical History:  Diagnosis Date  . Acid reflux   . Anxiety   . Arrhythmia    paroxysmal supreventricular tachycardia  . Arthritis   . Complication of anesthesia    Myotonia congenita  . Dysrhythmia   . Essential hypertension   . Family history of adverse reaction to anesthesia    Sister  . High cholesterol   . History of skin cancer   . Myotonia congenita   . Type 2 diabetes mellitus [facility])     Surgical History: Past Surgical History:  Procedure Laterality Date  . APPLICATION OF WOUND VAC  11/27/2020   Procedure: APPLICATION OF WOUND VAC;  Surgeon: Kristeen Miss, MD;  Location: [facility];  Service: Neurosurgery;;  . BACK SURGERY     times 2  . CARPAL TUNNEL RELEASE    . CATARACT EXTRACTION W/PHACO Left 01/31/2019    Procedure: CATARACT EXTRACTION PHACO AND INTRAOCULAR LENS PLACEMENT (IOC);  Surgeon: Baruch Goldmann, MD;  Location: [facility];  Service: Ophthalmology;  Laterality: Left;  CDE: 2.66  . LUMBAR WOUND DEBRIDEMENT N/A 11/27/2020   Procedure: LUMBAR WOUND DEBRIDEMENT AND WASHOUT;  Surgeon: Kristeen Miss, MD;  Location: [place];  Service: Neurosurgery;  Laterality: N/A;  . NOSE SURGERY    . TEE WITHOUT CARDIOVERSION N/A 12/03/2020   Procedure: TRANSESOPHAGEAL ECHOCARDIOGRAM (TEE);  Surgeon: Jerline Pain, MD;  Location: [facility];  Service: Cardiovascular;  Laterality: N/A;  . TOTAL KNEE ARTHROPLASTY Right 10/12/2018   Procedure: RIGHT TOTAL KNEE ARTHROPLASTY;  Surgeon: Garald Balding, MD;  Location: [facility];  Service: Orthopedics;  Laterality: Right;    Medications:   Current Facility-Administered Medications:  .  0.9 %  sodium chloride infusion, , Intravenous, Continuous, Cheryln Manly, NP, Stopped at 12/04/20 [phone] .  [COMPLETED] acetaminophen (TYLENOL) tablet 1,000 mg, 1,000 mg, Oral, Q8H, 1,000 mg at 12/06/20 0509 **FOLLOWED BY** acetaminophen (TYLENOL) tablet 650 mg, 650 mg, Oral, Q6H PRN, Elodia Florence., MD .  albuterol (PROVENTIL) (2.5 MG/3ML) 0.083% nebulizer solution 2.5 mg, 2.5 mg, Nebulization, Q2H PRN, Minor, Grace Bushy, NP, 2.5 mg at 11/28/20 1949 .  aspirin chewable tablet 81 mg, 81 mg, Oral, Daily,  Minor, Grace Bushy, NP, 81 mg at 12/06/20 0906 .  bisacodyl (DULCOLAX) suppository 10 mg, 10 mg, Rectal, Daily, Meuth, Brooke A, PA-C, 10 mg at 12/02/20 1035 .  budesonide (PULMICORT) nebulizer solution 0.25 mg, 0.25 mg, Nebulization, BID, Chand, Sudham, MD, 0.25 mg at 12/06/20 0830 .  Carbidopa-Levodopa ER 48.75-195 MG CPCR 1 tablet, 1 tablet, Oral, QID, Elodia Florence., MD, 1 tablet at 12/06/20 1439 .  carvedilol (COREG) tablet 25 mg, 25 mg, Oral, BID WC, Jacky Kindle, MD, 25 mg at 12/06/20 1602 .  ceFAZolin (ANCEF) IVPB 2g/100 mL premix, 2 g, Intravenous, Q8H, Carlyle Basques, MD, Last Rate: 200 mL/hr at 12/06/20 1600, 2 g at 12/06/20 1600 .  Chlorhexidine Gluconate Cloth 2 % PADS 6 each, 6 each, Topical, Daily, Minor, Grace Bushy, NP, 6 each at 12/06/20 1249 .  diltiazem (CARDIZEM CD) 24 hr capsule 240 mg, 240 mg, Oral, Daily, Troy Sine, MD, 240 mg at 12/06/20 0906 .  docusate sodium (COLACE) capsule 100 mg, 100 mg, Oral, BID, Kristeen Miss, MD, 100 mg at 12/05/20 2126 .  gabapentin (NEURONTIN) tablet 300 mg, 300 mg, Oral, QHS, Timothy Trudell A, 300 mg at 12/05/20 2127 .  heparin injection 5,000 Units, 5,000 Units, Subcutaneous, Q8H, Minor, Grace Bushy, NP, 5,000 Units at 12/06/20 1414 .  HYDROmorphone (DILAUDID) injection 0.5-1 mg, 0.5-1 mg, Intravenous, Q3H PRN, Minor, Grace Bushy, NP, 1 mg at 12/06/20 1601 .  hydrOXYzine (ATARAX/VISTARIL) tablet 25 mg, 25 mg, Oral, TID PRN, Minor, Grace Bushy, NP, 25 mg at 12/05/20 2133 .  insulin aspart (novoLOG) injection 0-9 Units, 0-9 Units, Subcutaneous, Q4H, Jacky Kindle, MD, 2 Units at 12/06/20 1721 .  loratadine (CLARITIN) tablet 10 mg, 10 mg, Oral, Daily, Minor, Grace Bushy, NP, 10 mg at 12/06/20 0905 .  LORazepam (ATIVAN) injection 0.5 mg, 0.5 mg, Intravenous, TID PRN, Minor, Grace Bushy, NP, 0.5 mg at 12/06/20 1728 .  magnesium oxide (MAG-OX) tablet 400 mg, 400 mg, Oral, BID, Elodia Florence., MD, 400 mg at 12/06/20 0905 .  menthol-cetylpyridinium (CEPACOL) lozenge 3 mg, 1 lozenge, Oral, PRN **OR** phenol (CHLORASEPTIC) mouth spray 1 spray, 1 spray, Mouth/Throat, PRN, Kristeen Miss, MD .  methocarbamol (ROBAXIN) 500 mg in dextrose 5 % 50 mL IVPB, 500 mg, Intravenous, Q8H PRN, Elodia Florence., MD, Stopped at 12/05/20 [phone]  ondansetron (ZOFRAN) tablet 4 mg, 4 mg, Oral, Q6H PRN **OR** ondansetron (ZOFRAN) injection 4 mg, 4 mg, Intravenous, Q6H PRN, Kristeen Miss, MD .  oxyCODONE (Oxy IR/ROXICODONE) immediate release tablet 10 mg, 10 mg, Oral, Q6H PRN, Minor, Grace Bushy, NP, 10 mg at 12/05/20 1925 .   pantoprazole (PROTONIX) EC tablet 40 mg, 40 mg, Oral, Q1200, Elodia Florence., MD, 40 mg at 12/06/20 1204 .  PARoxetine (PAXIL) tablet 20 mg, 20 mg, Oral, BID, Elodia Florence., MD, 20 mg at 12/06/20 0906 .  phenytoin (DILANTIN) ER capsule 100 mg, 100 mg, Oral, TID, Elodia Florence., MD, 100 mg at 12/06/20 1413 .  polyethylene glycol (MIRALAX / GLYCOLAX) packet 17 g, 17 g, Oral, Daily, Florene Glen, A Clint Lipps., MD .  rOPINIRole (REQUIP) tablet 0.5 mg, 0.5 mg, Oral, 2 times per day, Elodia Florence., MD, 0.5 mg at 12/06/20 1602 .  senna (SENOKOT) tablet 8.6 mg, 1 tablet, Oral, BID, Elodia Florence., MD, 8.6 mg at 12/06/20 0906 .  sodium chloride flush (NS) 0.9 % injection 10-40 mL, 10-40 mL, Intracatheter, Q12H, Jacky Kindle, MD, 10  mL at 12/06/20 1250 .  sodium chloride flush (NS) 0.9 % injection 10-40 mL, 10-40 mL, Intracatheter, PRN, Jacky Kindle, MD .  sodium phosphate (FLEET) 7-19 GM/118ML enema 1 enema, 1 enema, Rectal, Once PRN, Kristeen Miss, MD .  thiamine tablet 100 mg, 100 mg, Oral, Daily, Karlin Heilman A, 100 mg at 12/06/20 0906  Allergies: No Known Allergies  Social History:  Lives with girlfriend of 87 years  The patient's family history includes Cancer in his father; Healthy in his father and mother.    Objective  Vital signs:  Temp:  [97.7 F (36.5 C)-99 F (37.2 C)] 98 F (36.7 C) (09/29 1632) Pulse Rate:  [71-83] 79 (09/29 1632) Resp:  [18] 18 (09/29 1632) BP: (106-161)/(70-88) 121/85 (09/29 1632) SpO2:  [86 %-100 %] 97 % (09/29 1632)  Physical Exam: General: lying in bed, moved around a little more today, smiled occasionally Respiratory: no increased work of breathing  Neuro/MSK: deferred gait due to debility, no obvious tremors, CNII-XII grossly intact Skin: pale Less wincing.  Mental status exam On my mental status examination presently, the patient is alert, and grossly oriented to person, place, and time.  He is  attentive to exam. Appearance:  Calm, ill-appearing, pale, appears stated age.  Speech spontaneous and normal in volume, rate, and rhythm.  Mood:  "Nervous about the future".  Affect:  Full.  Thought process:  Linear associations, normal rate, logical.  Thought content is devoid of suicidal, homicidal, violent, and delusional material.  There are no evident hallucinations, he endorsed some visual hallucinations over the past week when delirious.  Judgment is fair.  Insight is fair.  Recent and remote memory:  Grossly intact.  Language is intact.  Fund of knowledge:  Grossly intact.   Data Reviewed: Physician and nursing notes  Additional Psychometric Testing: none

## 2020-12-06 NOTE — TOC Progression Note (Signed)
Transition of Care [facility]) - Progression Note    Patient Details  Name: Callin Ashe MRN: [id number] Date of Birth: 1955/04/03  Transition of [facility] CM/SW [place], LCSW Phone Number: 12/06/2020, 3:30 PM  Clinical Narrative:    CSW faxed requested form for DME to the [facility] confirmed receipt; she has ordered 3in1 and bedside commode for patient so that it will be delivered and ready once patient is out of SNF.    Expected Discharge Plan: Skilled Nursing Facility Barriers to Discharge: Ship broker, Continued Medical Work up, SNF Pending bed offer  Expected Discharge Plan and Services Expected Discharge Plan: [place] In-house Referral: Clinical Social Work   Post Acute Care Choice: [place] Living arrangements for the past 2 months: [place] Expected Discharge Date: 12/06/20                         HH Arranged: IV Antibiotics HH Agency: [facility] Date HH Agency Contacted: 11/25/20 Time [place]: 57 Representative spoke with at [place]: [place] Determinants of Health ([place]) Interventions    Readmission Risk Interventions No flowsheet data found.

## 2020-12-06 NOTE — TOC Progression Note (Signed)
Transition of Care [facility]) - Progression Note    Patient Details  Name: Stella Encarnacion MRN: [id number] Date of Birth: Jul 27, 1955  Transition of Care [facility]) CM/SW Contact  Sharlet [place] Mila Homer, LCSW Phone Number: 12/06/2020, 6:03 PM  Clinical Narrative:  Patient will discharge to [facility] tomorrow instead of today, as per Ebony Hail, admissions director, they would not be able to get all of the patient's medications. MD and patient informed. Patient's latest COVID test resulted negative on 9/27.      Expected Discharge Plan: Skilled Nursing Facility Barriers to Discharge: Ship broker, Continued Medical Work up, SNF Pending bed offer  Expected Discharge Plan and Services Expected Discharge Plan: [place] In-house Referral: Clinical Social Work   Post Acute Care Choice: [place] Living arrangements for the past 2 months: [place] Expected Discharge Date: 12/06/20                         HH Arranged: IV Antibiotics HH Agency: [facility] Date HH Agency Contacted: 11/25/20 Time [place]: 21 Representative spoke with at [place]: [place] Determinants of Health ([place]) Interventions  No SDOH interventions requested or needed at this time.  Readmission Risk Interventions No flowsheet data found.

## 2020-12-06 NOTE — Progress Notes (Signed)
PHARMACY CONSULT NOTE FOR:  OUTPATIENT  PARENTERAL ANTIBIOTIC THERAPY (OPAT)  Indication:Bacteremia  Regimen: Cefazolin 2g IV q8h End date: 01/09/21  IV antibiotic discharge orders are pended. To discharging provider:  please sign these orders via discharge navigator,  Select New Orders & click on the button choice - Manage This Unsigned Work.     Thank you for allowing pharmacy to be a part of this patient's care.  Lestine Box, PharmD PGY2 Infectious Diseases Pharmacy Resident   Please check [email or web address] for unit-specific pharmacy phone numbers

## 2020-12-06 NOTE — Plan of Care (Signed)

## 2020-12-06 NOTE — Discharge Summary (Addendum)
Physician Discharge Summary  Willey Due QZR:[id number] DOB: 11-Feb-1956 DOA: 11/23/2020  PCP: [facility]  Admit date: 11/23/2020 Discharge date: 12/07/2020  Admitted From: home Disposition:  SNF  Recommendations for Outpatient Follow-up:  Follow up with PCP in 1-2 weeks. Please obtain CMP/CBC /ESR CRP weekly to Dr. Graylon [facility]  Follow-up with wound care/neurosurgery in a week Dr. Saintclair Halsted Follow-up with ID clinic in 3 to 4 weeks   Home Health:NO  Equipment/Devices: NONE  Discharge Condition: Stable Code Status:   Code Status: Full Code Diet recommendation:  Diet Order             DIET SOFT Room service appropriate? Yes; Fluid consistency: Thin  Diet effective now                    Brief/Interim Summary: 65 years old male with past medical history of GERD, hypertension, hyperlipidemia, myotonia congenita, diabetes mellitus type 2 and recent lumbar decompression and extension of posterior spinal instrumented fusion (discharged 8/27) presented to [facility] on 9/17 with intractable abdominal pain and was noted to be having ileus.  He was then transferred to [facility] for further evaluation and treatment.  Neurosurgery was also consulted and patient was noted to have MRSA bacteremia with postoperative wound infection.  TEE was done on 926 which did not show any evidence of endocarditis.  He underwent debridement on 11/27/2020 and was initially in the ICU.  During hospitalization, patient was seen by surgery, PCCM, cardiology and GI.  Patient had atrial fibrillation with RVR.  Subsequently patient was considered to be stable and transferred to hospitalist service.  Infectious disease and neurosurgery following.  At this time, patient is awaiting for placement at rehabilitation placement.  Patient this morning has no new complaints wanting to talk with therapy psychiatry prior to discharge, is moving all extremities nonfocal.  He is medically stable  for discharge to skilled nursing facility today. Facility not ready to accept patient on 9/29 os stayed overnight in hospital and is stable for discharge today 930/22  Discharge Diagnoses:   MSSA bacteremia secondary to postop wound infection  (T10-L3 laminectomy 10/21/2020): s/p debridement of postoperative lumbar wound infection on 11/27/2020.TEE- no evidence of endocarditis.Seen by neurosurgery infectious disease, wound care.  Plan is to discharge patient to skilled nursing facility with wound VAC at 75 mmHg pressure per neurosurgery, wound care and IV cefazolin x6-week, and have ID clinic follow-up at 6 weeks to decide if he needs to transition to oral antibiotic.  Continue pain control.  Chronic low back pain: Continue pain control PT OT.  No red flags signs on neurological exam.   Ileus: Managed with NG decompression, at this time resolved.  Taper Reglan, surgery signed off tolerating diet.    A. fib with RVR: Rate controlled, continue Cardizem, Coreg not on anticoagulation and will need to follow-up with cardiology as outpatient for intercalation plan.  Cardiology signed off.     Essential hypertension: BP controlled continue Coreg Cardizem lisinopril and HCTZ been discontinued    Hyperlipidemia: on lipitor, aspirin.   Myotonia Congenita:Continue Sinemet and Dilantin.   HLK:TGYBWLSL Requip   Hypokalemia/ Hypomagnesemia Potassium supplementation    Suicidal Statement:Seen by psychiatry.Recommended decrease paroxetine 20 mg twice daily.  Continue Ativan and hydroxyzine as needed   Consults: Surgery Neurosurgery, Cardiology PCCM ID GI  Subjective: Alert and oriented this morning.  Able to move all his extremities well nonfocal with good strength no incontinence.  Has low back pain  especially with moving. He would like to have him PT session and also talk with psychiatry prior to discharge  Discharge Exam: Vitals:   12/07/20 0803 12/07/20 0945  BP:  118/66  Pulse:  79   Resp:  19  Temp:  98.2 F (36.8 C)  SpO2: 92% 97%   General: Pt is alert, awake, not in acute distress Cardiovascular: RRR, S1/S2 +, no rubs, no gallops Respiratory: CTA bilaterally, no wheezing, no rhonchi Abdominal: Soft, NT, ND, bowel sounds + Extremities: no edema, no cyanosis  Discharge Instructions  Discharge Instructions     [facility] pharmacist to adjust dose for Vancomycin, Aminoglycosides and other anti-infective therapies as requested by physician.   Complete by: As directed    [facility] to provide Cath Flo 26m   Complete by: As directed    Administer for PICC line occlusion and as ordered by physician for other access device issues.   Anaphylaxis Kit: Provided to treat any anaphylactic reaction to the medication being provided to the patient if First Dose or when requested by physician   Complete by: As directed    Epinephrine 117mml vial / amp: Administer 0.40m64m0.40ml59mubcutaneously once for moderate to severe anaphylaxis, nurse to call physician and pharmacy when reaction occurs and call 911 if needed for immediate care   Diphenhydramine 50mg55mIV vial: Administer 25-50mg 29mM PRN for first dose reaction, rash, itching, mild reaction, nurse to call physician and pharmacy when reaction occurs   Sodium Chloride 0.9% NS 500ml I57mdminister if needed for hypovolemic blood pressure drop or as ordered by physician after call to physician with anaphylactic reaction   Change dressing on IV access line weekly and PRN   Complete by: As directed    Discharge instructions   Complete by: As directed    Check weekly CMP, CBC CRP and sending to ID clinic  Continue wound VAC per wound care/neurosurgery with pressure setting of 75 mmHg.  Please call call MD or return to ER for similar or worsening recurring problem that brought you to hospital or if any fever,nausea/vomiting,abdominal pain, uncontrolled pain, chest pain,  shortness of breath or any other  alarming symptoms.  Please follow-up your doctor as instructed in a week time and call the office for appointment.  Follow-up with neurosurgery Dr. Cram  iSaintclair Halstedweek  Please avoid alcohol, smoking, or any other illicit substance and maintain healthy habits including taking your regular medications as prescribed.  You were cared for by a hospitalist during your hospital stay. If you have any questions about your discharge medications or the care you received while you were in the hospital after you are discharged, you can call the unit and ask to speak with the hospitalist on call if the hospitalist that took care of you is not available.  Once you are discharged, your primary care physician will handle any further medical issues. Please note that NO REFILLS for any discharge medications will be authorized once you are discharged, as it is imperative that you return to your primary care physician (or establish a relationship with a primary care physician if you do not have one) for your aftercare needs so that they can reassess your need for medications and monitor your lab values   Discharge wound care:   Complete by: As directed    Place saline moist gauze dressing; top with ABD pads; secure with tape. Continue wound VAC with pressure setting of 25 mmHg follow-up with wound care/neurosurgery for  further recommendations   Flush IV access with Sodium Chloride 0.9% and Heparin 10 units/ml or 100 units/ml   Complete by: As directed    Home infusion instructions - [facility]   Complete by: As directed    Instructions: Flush IV access with Sodium Chloride 0.9% and Heparin 10units/ml or 100units/ml   Change dressing on IV access line: Weekly and PRN   Instructions Cath Flo 67m: Administer for PICC Line occlusion and as ordered by physician for other access device   [facility] pharmacist to adjust dose for: Vancomycin, Aminoglycosides and other anti-infective therapies as  requested by physician   Incentive spirometry RT   Complete by: As directed    Increase activity slowly   Complete by: As directed    Method of administration may be changed at the discretion of home infusion pharmacist based upon assessment of the patient and/or caregiver's ability to self-administer the medication ordered   Complete by: As directed       Allergies as of 12/07/2020   No Known Allergies      Medication List     STOP taking these medications    ascorbic acid 500 MG tablet Commonly known as: VITAMIN C   lisinopril-hydrochlorothiazide 20-25 MG tablet Commonly known as: ZESTORETIC   metoprolol succinate 25 MG 24 hr tablet Commonly known as: Toprol XL   polyethylene glycol 236 g solution Commonly known as: GoLYTELY   traMADol 50 MG tablet Commonly known as: ULTRAM   zinc sulfate 220 (50 Zn) MG capsule       TAKE these medications    acetaminophen 325 MG tablet Commonly known as: TYLENOL Take 2 tablets (650 mg total) by mouth every 6 (six) hours as needed for mild pain, headache or fever.   albuterol 108 (90 Base) MCG/ACT inhaler Commonly known as: VENTOLIN HFA Inhale 2 puffs into the lungs every 6 (six) hours as needed for wheezing or shortness of breath.   aspirin 81 MG chewable tablet Chew 1 tablet (81 mg total) by mouth daily.   atorvastatin 80 MG tablet Commonly known as: LIPITOR Take 40 mg by mouth daily.   bisacodyl 10 MG suppository Commonly known as: DULCOLAX Place 1 suppository (10 mg total) rectally daily as needed for moderate constipation.   Carbidopa-Levodopa ER 48.75-195 MG Cpcr Take 1 tablet by mouth 4 (four) times daily.   carvedilol 25 MG tablet Commonly known as: COREG Take 1 tablet (25 mg total) by mouth 2 (two) times daily with a meal.   ceFAZolin  IVPB Commonly known as: ANCEF Inject 2 g into the vein every 8 (eight) hours. Indication:  Bacteremia First Dose: Yes Last Day of Therapy:  01/09/21 Labs - Once weekly:   CBC/D and BMP, Labs - Every other week:  ESR and CRP Method of administration: IV Push Method of administration may be changed at the discretion of home infusion pharmacist based upon assessment of the patient and/or caregiver's ability to self-administer the medication ordered.   cyclobenzaprine 10 MG tablet Commonly known as: FLEXERIL Take 1 tablet (10 mg total) by mouth 3 (three) times daily as needed for muscle spasms.   diltiazem 240 MG 24 hr capsule Commonly known as: CARDIZEM CD Take 1 capsule (240 mg total) by mouth daily.   docusate sodium 100 MG capsule Commonly known as: COLACE Take 100 mg by mouth daily.   guaiFENesin-dextromethorphan 100-10 MG/5ML syrup Commonly known as: ROBITUSSIN DM Take 10 mLs by mouth every 6 (six) hours as needed  for cough.   hydrOXYzine 25 MG tablet Commonly known as: ATARAX/VISTARIL Take 1 tablet (25 mg total) by mouth 3 (three) times daily as needed for anxiety. What changed:  when to take this reasons to take this   loratadine 10 MG tablet Commonly known as: CLARITIN Take 10 mg by mouth daily.   LORazepam 1 MG tablet Commonly known as: ATIVAN Take 1 tablet (1 mg total) by mouth 2 (two) times daily as needed for up to 4 doses for anxiety. What changed:  how much to take when to take this reasons to take this   magnesium oxide 400 (240 Mg) MG tablet Commonly known as: MAG-OX Take 1 tablet (400 mg total) by mouth 2 (two) times daily for 3 days.   meloxicam 15 MG tablet Commonly known as: MOBIC Take 15 mg by mouth daily.   metFORMIN 500 MG tablet Commonly known as: GLUCOPHAGE Take 500 mg by mouth 2 (two) times daily with a meal.   methocarbamol 500 MG tablet Commonly known as: ROBAXIN Take 1 tablet (500 mg total) by mouth every 6 (six) hours as needed for up to 10 days for muscle spasms. What changed:  how much to take when to take this reasons to take this   metoCLOPramide 5 MG tablet Commonly known as: REGLAN 5 mg  twice daily before meals for 2 days 5 mg daily before meals for 2 days then stop   Multi-Vitamin tablet Take 1 tablet by mouth daily.   oxyCODONE 5 MG immediate release tablet Commonly known as: Oxy IR/ROXICODONE Take 1 tablet (5 mg total) by mouth every 6 (six) hours as needed for up to 5 doses for severe pain (pain). What changed:  when to take this reasons to take this   pantoprazole 40 MG tablet Commonly known as: PROTONIX Take 40 mg by mouth daily.   PARoxetine 20 MG tablet Commonly known as: PAXIL Take 1 tablet (20 mg total) by mouth 2 (two) times daily. What changed:  medication strength how much to take when to take this additional instructions   phenytoin 100 MG ER capsule Commonly known as: DILANTIN Take 100 mg by mouth 3 (three) times daily.   polyethylene glycol 17 g packet Commonly known as: MIRALAX / GLYCOLAX Take 17 g by mouth daily.   rOPINIRole 1 MG tablet Commonly known as: REQUIP Take 0.71m at 6pm and 145mat 9pm. What changed:  how much to take how to take this when to take this additional instructions   senna 8.6 MG Tabs tablet Commonly known as: SENOKOT Take 1 tablet (8.6 mg total) by mouth 2 (two) times daily.   sodium chloride 5 % ophthalmic solution Commonly known as: MURO 128 Place 1 drop into the right eye every 3 (three) hours as needed for eye irritation.   thiamine 100 MG tablet Take 1 tablet (100 mg total) by mouth daily.   Vitamin D3 125 MCG (5000 UT) Tabs Take 5,000 Units by mouth daily.               Discharge Care Instructions  (From admission, onward)           Start     Ordered   12/06/20 0000  Change dressing on IV access line weekly and PRN  (Home infusion instructions - [facility] )        12/06/20 1149   12/06/20 0000  Discharge wound care:       Comments: Place saline moist gauze dressing; top  with ABD pads; secure with tape. Continue wound VAC with pressure setting of 25 mmHg follow-up  with wound care/neurosurgery for further recommendations   12/06/20 1148            Contact information for follow-up providers     [facility] [place] Follow up in 1 week(s).   Contact information: [place] [address] [phone]         Satira Sark, MD .   Specialty: Cardiology Contact information: [place] [address] [phone]         Carlyle Basques, MD. Call in 1 week(s).   Specialty: Infectious Diseases Contact information: [place] [address] [phone]         Kary Kos, MD Follow up in 1 week(s).   Specialty: Neurosurgery Why: call for wound care needs too Contact information: [address] [place] [address] [phone]              Contact information for after-discharge care     [facility] .   Service: Skilled Nursing Contact information: [address] [place] [phone]                    No Known Allergies  The results of significant diagnostics from this hospitalization (including imaging, microbiology, ancillary and laboratory) are listed below for reference.    Microbiology: Recent Results (from the past 240 hour(s))  Aerobic Culture w Gram Stain (superficial specimen)     Status: None   Collection Time: 11/27/20  1:41 PM   Specimen: Wound  Result Value Ref Range Status   Specimen Description WOUND  Final   Special Requests SOFT TISSUE ABCESS  Final   Gram Stain   Final    ABUNDANT WBC PRESENT,BOTH PMN AND MONONUCLEAR RARE GRAM POSITIVE COCCI Performed at [facility], [address], [place], [place] [address]    Culture FEW STAPHYLOCOCCUS AUREUS  Final   Report Status 11/29/2020 FINAL  Final   Organism ID, Bacteria STAPHYLOCOCCUS AUREUS  Final      Susceptibility   Staphylococcus aureus - MIC*     CIPROFLOXACIN <=0.5 SENSITIVE Sensitive     ERYTHROMYCIN <=0.25 SENSITIVE Sensitive     GENTAMICIN <=0.5 SENSITIVE Sensitive     OXACILLIN <=0.25 SENSITIVE Sensitive     TETRACYCLINE <=1 SENSITIVE Sensitive     VANCOMYCIN 1 SENSITIVE Sensitive     TRIMETH/SULFA <=10 SENSITIVE Sensitive     CLINDAMYCIN <=0.25 SENSITIVE Sensitive     RIFAMPIN <=0.5 SENSITIVE Sensitive     Inducible Clindamycin NEGATIVE Sensitive     * FEW STAPHYLOCOCCUS AUREUS  Surgical PCR screen     Status: Abnormal   Collection Time: 11/27/20  1:58 PM   Specimen: Nasal Mucosa; Nasal Swab  Result Value Ref Range Status   MRSA, PCR NEGATIVE NEGATIVE Final   Staphylococcus aureus POSITIVE (A) NEGATIVE Final    Comment: (NOTE) The Xpert SA Assay (FDA approved for NASAL specimens in patients 66 years of age and older), is one component of a comprehensive surveillance program. It is not intended to diagnose infection nor to guide or monitor treatment. Performed at [facility], [place] [address] [place],  [address]   Surgical pcr screen     Status: None   Collection Time: 12/03/20  8:49 AM  Specimen: Nasal Mucosa; Nasal Swab  Result Value Ref Range Status   MRSA, PCR NEGATIVE NEGATIVE Final   Staphylococcus aureus NEGATIVE NEGATIVE Final    Comment: (NOTE) The Xpert SA Assay (FDA approved for NASAL specimens in patients 31 years of age and older), is one component of a comprehensive surveillance program. It is not intended to diagnose infection nor to guide or monitor treatment. Performed at [facility], [place] [address], [place], [place] [address]   Resp Panel by RT-PCR (Flu A&B, Covid) Nasopharyngeal Swab     Status: None   Collection Time: 12/04/20  5:38 PM   Specimen: Nasopharyngeal Swab; Nasopharyngeal(NP) swabs in vial transport medium  Result Value Ref Range Status   SARS Coronavirus 2 by RT PCR NEGATIVE NEGATIVE Final    Comment: (NOTE) SARS-CoV-2 target nucleic acids are NOT  DETECTED.  The SARS-CoV-2 RNA is generally detectable in upper respiratory specimens during the acute phase of infection. The lowest concentration of SARS-CoV-2 viral copies this assay can detect is 138 copies/mL. A negative result does not preclude SARS-Cov-2 infection and should not be used as the sole basis for treatment or other patient management decisions. A negative result may occur with  improper specimen collection/handling, submission of specimen other than nasopharyngeal swab, presence of viral mutation(s) within the areas targeted by this assay, and inadequate number of viral copies(<138 copies/mL). A negative result must be combined with clinical observations, patient history, and epidemiological information. The expected result is Negative.  Fact Sheet for Patients:  EntrepreneurPulse.com.au  Fact Sheet for Healthcare Providers:  IncredibleEmployment.be  This test is no t yet approved or cleared by the [place] FDA and  has been authorized for detection and/or diagnosis of SARS-CoV-2 by FDA under an Emergency Use Authorization (EUA). This EUA will remain  in effect (meaning this test can be used) for the duration of the COVID-19 declaration under Section 564(b)(1) of the Act, 21 U.S.C.section 360bbb-3(b)(1), unless the authorization is terminated  or revoked sooner.       Influenza A by PCR NEGATIVE NEGATIVE Final   Influenza B by PCR NEGATIVE NEGATIVE Final    Comment: (NOTE) The Xpert Xpress SARS-CoV-2/FLU/RSV plus assay is intended as an aid in the diagnosis of influenza from Nasopharyngeal swab specimens and should not be used as a sole basis for treatment. Nasal washings and aspirates are unacceptable for Xpert Xpress SARS-CoV-2/FLU/RSV testing.  Fact Sheet for Patients: EntrepreneurPulse.com.au  Fact Sheet for Healthcare Providers: IncredibleEmployment.be  This test is not yet  approved or cleared by the [place] FDA and has been authorized for detection and/or diagnosis of SARS-CoV-2 by FDA under an Emergency Use Authorization (EUA). This EUA will remain in effect (meaning this test can be used) for the duration of the COVID-19 declaration under Section 564(b)(1) of the Act, 21 U.S.C. section 360bbb-3(b)(1), unless the authorization is terminated or revoked.  Performed at [facility], [place] [address] [place], [place] [address]   Resp Panel by RT-PCR (Flu A&B, Covid) Nasopharyngeal Swab     Status: None   Collection Time: 12/07/20 10:11 AM   Specimen: Nasopharyngeal Swab; Nasopharyngeal(NP) swabs in vial transport medium  Result Value Ref Range Status   SARS Coronavirus 2 by RT PCR NEGATIVE NEGATIVE Final    Comment: (NOTE) SARS-CoV-2 target nucleic acids are NOT DETECTED.  The SARS-CoV-2 RNA is generally detectable in upper respiratory specimens during the acute phase of infection. The lowest concentration of SARS-CoV-2 viral copies this assay can detect  is 138 copies/mL. A negative result does not preclude SARS-Cov-2 infection and should not be used as the sole basis for treatment or other patient management decisions. A negative result may occur with  improper specimen collection/handling, submission of specimen other than nasopharyngeal swab, presence of viral mutation(s) within the areas targeted by this assay, and inadequate number of viral copies(<138 copies/mL). A negative result must be combined with clinical observations, patient history, and epidemiological information. The expected result is Negative.  Fact Sheet for Patients:  EntrepreneurPulse.com.au  Fact Sheet for Healthcare Providers:  IncredibleEmployment.be  This test is no t yet approved or cleared by the [place] FDA and  has been authorized for detection and/or diagnosis of SARS-CoV-2 by FDA under an Emergency Use Authorization  (EUA). This EUA will remain  in effect (meaning this test can be used) for the duration of the COVID-19 declaration under Section 564(b)(1) of the Act, 21 U.S.C.section 360bbb-3(b)(1), unless the authorization is terminated  or revoked sooner.       Influenza A by PCR NEGATIVE NEGATIVE Final   Influenza B by PCR NEGATIVE NEGATIVE Final    Comment: (NOTE) The Xpert Xpress SARS-CoV-2/FLU/RSV plus assay is intended as an aid in the diagnosis of influenza from Nasopharyngeal swab specimens and should not be used as a sole basis for treatment. Nasal washings and aspirates are unacceptable for Xpert Xpress SARS-CoV-2/FLU/RSV testing.  Fact Sheet for Patients: EntrepreneurPulse.com.au  Fact Sheet for Healthcare Providers: IncredibleEmployment.be  This test is not yet approved or cleared by the [place] FDA and has been authorized for detection and/or diagnosis of SARS-CoV-2 by FDA under an Emergency Use Authorization (EUA). This EUA will remain in effect (meaning this test can be used) for the duration of the COVID-19 declaration under Section 564(b)(1) of the Act, 21 U.S.C. section 360bbb-3(b)(1), unless the authorization is terminated or revoked.  Performed at [facility], [place] [address], [place], [place] [address]     Procedures/Studies: DG Abd 1 View  Result Date: 11/30/2020 CLINICAL DATA:  Ileus EXAM: ABDOMEN - 1 VIEW COMPARISON:  11/28/2020 FINDINGS: Persistent air distended large and small bowel loops throughout the abdomen, similar in appearance to the previous study. No gross free intraperitoneal air on portable supine view. Enteric tube tip terminates just beyond the level of the GE junction. Extensive spinal fusion hardware. IMPRESSION: 1. Persistent air distended large and small bowel loops throughout the abdomen, similar in appearance to the previous study and suggestive of ileus. 2. Enteric tube tip terminates just beyond the  level of the GE junction. Electronically Signed   By: Davina Poke D.O.   On: 11/30/2020 15:28   DG Abd 1 View  Result Date: 11/26/2020 CLINICAL DATA:  Constipation with blood in stool EXAM: ABDOMEN - 1 VIEW COMPARISON:  11/25/2020 FINDINGS: Rectal catheter is in place. Nasogastric tube is in place. There is gas within small and large bowel which could be due to ileus or pseudo-obstruction of the elderly. Extensive previous spinal fusion with multiple spinal augmentations as seen previously. IMPRESSION: Rectal tube now in place. Intestinal gas consistent with ileus. No other significant change since yesterday. Electronically Signed   By: Nelson Chimes M.D.   On: 11/26/2020 08:51   DG Abd 1 View  Result Date: 11/25/2020 CLINICAL DATA:  NG tube placement. EXAM: ABDOMEN - 1 VIEW COMPARISON:  Abdominal x-ray 11/25/2020. FINDINGS: Nasogastric tube tip is in the proximal stomach, just beyond the gastroesophageal junction. Again seen is diffuse gaseous distension of the  colon. This is slightly decreased when compared to the prior study. Air seen within central nondilated small bowel. The visualized lung bases are clear. Thoracolumbar fusion hardware is again seen. IMPRESSION: 1. Nasogastric tube tip is in the proximal stomach just beyond the gastroesophageal junction. Recommend advancing tube. 2. Gaseous distension of the entire colon is again seen. Degree of distention has slightly improved. Continued follow-up recommended. Electronically Signed   By: Ronney Asters M.D.   On: 11/25/2020 21:09   CT Angio Chest Pulmonary Embolism (PE) W or WO Contrast  Result Date: 11/24/2020 CLINICAL DATA:  Shortness of breath. EXAM: CT ANGIOGRAPHY CHEST WITH CONTRAST TECHNIQUE: Multidetector CT imaging of the chest was performed using the standard protocol during bolus administration of intravenous contrast. Multiplanar CT image reconstructions and MIPs were obtained to evaluate the vascular anatomy. CONTRAST:  18m  OMNIPAQUE IOHEXOL 350 MG/ML SOLN COMPARISON:  November 22, 2020 FINDINGS: Cardiovascular: There is mild calcification of the aortic arch without evidence of aortic aneurysm or dissection. Satisfactory opacification of the pulmonary arteries to the segmental level. No evidence of pulmonary embolism. Normal heart size. No pericardial effusion. Mediastinum/Nodes: No enlarged mediastinal, hilar, or axillary lymph nodes. Thyroid gland, trachea, and esophagus demonstrate no significant findings. Lungs/Pleura: Mild atelectasis is seen within the bilateral lower lobes and anterior aspect of the left upper lobe. There is no evidence of a pleural effusion or pneumothorax. Upper Abdomen: No acute abnormality. Musculoskeletal: Bilateral metallic density pedicle screws are seen within the lower thoracic spine. Review of the MIP images confirms the above findings. IMPRESSION: 1. No evidence of pulmonary embolism. 2. Mild bilateral lower lobe and left upper lobe atelectasis. 3. Aortic atherosclerosis. Aortic Atherosclerosis (ICD10-I70.0). Electronically Signed   By: TVirgina NorfolkM.D.   On: 11/24/2020 03:51   MR CERVICAL SPINE WO CONTRAST  Result Date: 11/26/2020 CLINICAL DATA:  Spinal fusion, thoracic, follow up Epidural abscess; Spinal fusion, lumbosacral, follow up EXAM: MRI CERVICAL, THORACIC AND LUMBAR SPINE WITHOUT AND WITH CONTRAST TECHNIQUE: Multiplanar and multiecho pulse sequences of the cervical spine, to include the craniocervical junction and cervicothoracic junction, and thoracic and lumbar spine, were obtained without and with intravenous contrast. CONTRAST:  111mGADAVIST GADOBUTROL 1 MMOL/ML IV SOLN COMPARISON:  CT myelogram September 03, 2020. FINDINGS: MRI CERVICAL SPINE FINDINGS Significantly motion limited study.  Within this limitation: Alignment: Similar alignment with reversal of the normal cervical lordosis and grade 1 anterolisthesis of C3 on C4, C4 on C5, and C6 on C7. Vertebrae: Motion limited  evaluation. Degenerative/discogenic endplate signal changes at multiple levels. Otherwise, no obvious marrow edema to suggest acute fracture or discitis/osteomyelitis. Cord: Motion limited evaluation without definite cord signal abnormality. Posterior Fossa, vertebral arteries, paraspinal tissues: Visualized vertebral artery flow voids are maintained. No acute findings in the visualized posterior fossa. There is nonspecific ill-defined edema in the posterior paraspinal soft tissues. Disc levels: C2-C3: No significant disc protrusion, foraminal stenosis, or canal stenosis. C3-C4: Posterior disc osteophyte complex with left greater than right facet and uncovertebral hypertrophy. Resulting moderate to severe left foraminal stenosis and mild canal stenosis. C4-C5: Posterior disc osteophyte complex with right greater than left facet and uncovertebral hypertrophy. Resulting severe right foraminal stenosis and moderate canal stenosis. Flattening of the ventral cord. C5-C6: Posterior disc osteophyte complex with mild bilateral facet and uncovertebral hypertrophy. Mild canal stenosis without significant foraminal stenosis. C6-C7: Posterior disc osteophyte complex with central inferiorly dissecting disc protrusion versus calcified PLL (as seen on prior myelogram). Resulting moderate canal stenosis. Bilateral facet and uncovertebral hypertrophy  with mild to moderate bilateral foraminal stenosis. C7-T1: Posterior disc osteophyte complex with left greater than right facet and uncovertebral hypertrophy. Central disc protrusion with inferior extension. Resulting mild-to-moderate canal stenosis and mild bilateral foraminal stenosis. MRI THORACIC SPINE FINDINGS Alignment:  Normal. Vertebrae: Interval posterior fusion spanning from T10 inferiorly into the lumbar spine. Nonspecific mild edema within the disc spaces at T10-T11, T11-T12, and T12-L1. Cord:  Normal cord signal. Paraspinal and other soft tissues: Postoperative fluid  collection in the posterior paraspinal soft tissues in the lower thoracic spine and lumbar spine (detailed further below). Disc levels: Mild Mild disc bulging at multiple upper to mid thoracic levels. At T7-T8 right paracentral disc protrusion flattens the ventral cord with mild canal stenosis, similar to prior. At T8-T9, disc bulging and endplate spurring results in mild canal stenosis, similar. At T9-T10, evaluation is limited by metallic artifact; however, posterior disc bulging appears to result in mild to moderate canal stenosis, possibly progressed from the prior but poorly characterized. Interval extension of the patient's lumbar fusion superiorly to T10. At T10-T11, the canal appears to be mild to moderately moderately narrowed, similar versus slightly progressed. At T11-T12 the canal appears to be patent. MRI LUMBAR SPINE FINDINGS Motion limited evaluation.  Within this limitation: Segmentation:  Standard. Alignment:  Normal. Vertebrae:  Interval posterior fusion spanning from T10 to S1. Conus medullaris and cauda equina: Conus extends to the L1-L2 level. Conus appears normal. Paraspinal and other soft tissues: Postoperative edema in the paraspinal soft tissues with fluid collection in the paraspinal soft tissues measuring up to 8 x 3 x 63 mm, centered at T12-L1. There is also nonspecific fluid surrounding the posterior elements bilaterally at L2-L3. Disc levels: T12-L1: Posterior fusion. Mild canal stenosis. Limited evaluation of foramina due to metallic artifact. L1-L2: Posterior fusion and decompression. Improved canal stenosis with patent canal. Limited evaluation of the foramina due to metallic artifact with visualized portions appearing patent. L2-L3: Prior posterior decompression and fusion. Osteophytic ridging. Facet hypertrophy. Mild canal stenosis, similar. Limited evaluation of foramina with likely mild bilateral foraminal stenosis. L3-L4: Prior posterior fusion and decompression. Patent canal  and foramina. L4-L5: Prior posterior fusion and decompression. Patent canal with probably mild bilateral foraminal stenosis. L5-S1: Prior posterior fusion and decompression. Patent canal with mild left foraminal stenosis. IMPRESSION: MRI cervical spine: 1. Similar moderate canal stenosis at C4-C5 and C6-C7 2. Similar severe foraminal stenosis on the right at C4-C5 and moderate to severe foraminal stenosis on the left at C3-C4. 3. 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 4. Nonspecific ill-defined edema in the posterior paraspinal soft tissues, which could represent strain or cellulitis. MRI thoracic and lumbar spine: 1. Status post extension of lumbar fusion superiorly to T10. Nonspecific postoperative edema and fluid collection in the posterior paraspinal soft tissues centered at T12, which could represent a seroma but abscess is not excluded given reported purulent drainage. There also is nonspecific fluid surrounding the posterior elements at L2-L3. 2. Mild edema within the discs at T10-T11 and T12-L1, which could be degenerative or stress related but early discitis is difficult to exclude given the clinical history. No clearly infectious findings within the canal, although evaluation is limited by metallic artifact and motion. If there is clinical concern for infection, consider correlation with inflammatory markers and close interval follow-up MRI with contrast as clinically indicated. 3. Improved canal stenosis and foraminal stenosis at L1-L2 status post decompression and fusion. 4. Mild to  moderate canal stenosis at T9-T10 and T10-T11 appears similar versus slightly progressed. Similar mild canal stenosis at T7-T8. Electronically Signed   By: Margaretha Sheffield M.D.   On: 11/26/2020 16:39   MR THORACIC SPINE W WO CONTRAST  Result Date: 11/26/2020 CLINICAL DATA:  Spinal fusion, thoracic, follow up Epidural  abscess; Spinal fusion, lumbosacral, follow up EXAM: MRI CERVICAL, THORACIC AND LUMBAR SPINE WITHOUT AND WITH CONTRAST TECHNIQUE: Multiplanar and multiecho pulse sequences of the cervical spine, to include the craniocervical junction and cervicothoracic junction, and thoracic and lumbar spine, were obtained without and with intravenous contrast. CONTRAST:  15m GADAVIST GADOBUTROL 1 MMOL/ML IV SOLN COMPARISON:  CT myelogram September 03, 2020. FINDINGS: MRI CERVICAL SPINE FINDINGS Significantly motion limited study.  Within this limitation: Alignment: Similar alignment with reversal of the normal cervical lordosis and grade 1 anterolisthesis of C3 on C4, C4 on C5, and C6 on C7. Vertebrae: Motion limited evaluation. Degenerative/discogenic endplate signal changes at multiple levels. Otherwise, no obvious marrow edema to suggest acute fracture or discitis/osteomyelitis. Cord: Motion limited evaluation without definite cord signal abnormality. Posterior Fossa, vertebral arteries, paraspinal tissues: Visualized vertebral artery flow voids are maintained. No acute findings in the visualized posterior fossa. There is nonspecific ill-defined edema in the posterior paraspinal soft tissues. Disc levels: C2-C3: No significant disc protrusion, foraminal stenosis, or canal stenosis. C3-C4: Posterior disc osteophyte complex with left greater than right facet and uncovertebral hypertrophy. Resulting moderate to severe left foraminal stenosis and mild canal stenosis. C4-C5: Posterior disc osteophyte complex with right greater than left facet and uncovertebral hypertrophy. Resulting severe right foraminal stenosis and moderate canal stenosis. Flattening of the ventral cord. C5-C6: Posterior disc osteophyte complex with mild bilateral facet and uncovertebral hypertrophy. Mild canal stenosis without significant foraminal stenosis. C6-C7: Posterior disc osteophyte complex with central inferiorly dissecting disc protrusion versus calcified  PLL (as seen on prior myelogram). Resulting moderate canal stenosis. Bilateral facet and uncovertebral hypertrophy with mild to moderate bilateral foraminal stenosis. C7-T1: Posterior disc osteophyte complex with left greater than right facet and uncovertebral hypertrophy. Central disc protrusion with inferior extension. Resulting mild-to-moderate canal stenosis and mild bilateral foraminal stenosis. MRI THORACIC SPINE FINDINGS Alignment:  Normal. Vertebrae: Interval posterior fusion spanning from T10 inferiorly into the lumbar spine. Nonspecific mild edema within the disc spaces at T10-T11, T11-T12, and T12-L1. Cord:  Normal cord signal. Paraspinal and other soft tissues: Postoperative fluid collection in the posterior paraspinal soft tissues in the lower thoracic spine and lumbar spine (detailed further below). Disc levels: Mild Mild disc bulging at multiple upper to mid thoracic levels. At T7-T8 right paracentral disc protrusion flattens the ventral cord with mild canal stenosis, similar to prior. At T8-T9, disc bulging and endplate spurring results in mild canal stenosis, similar. At T9-T10, evaluation is limited by metallic artifact; however, posterior disc bulging appears to result in mild to moderate canal stenosis, possibly progressed from the prior but poorly characterized. Interval extension of the patient's lumbar fusion superiorly to T10. At T10-T11, the canal appears to be mild to moderately moderately narrowed, similar versus slightly progressed. At T11-T12 the canal appears to be patent. MRI LUMBAR SPINE FINDINGS Motion limited evaluation.  Within this limitation: Segmentation:  Standard. Alignment:  Normal. Vertebrae:  Interval posterior fusion spanning from T10 to S1. Conus medullaris and cauda equina: Conus extends to the L1-L2 level. Conus appears normal. Paraspinal and other soft tissues: Postoperative edema in the paraspinal soft tissues with fluid collection in the paraspinal soft tissues  measuring up to 8  x 3 x 63 mm, centered at T12-L1. There is also nonspecific fluid surrounding the posterior elements bilaterally at L2-L3. Disc levels: T12-L1: Posterior fusion. Mild canal stenosis. Limited evaluation of foramina due to metallic artifact. L1-L2: Posterior fusion and decompression. Improved canal stenosis with patent canal. Limited evaluation of the foramina due to metallic artifact with visualized portions appearing patent. L2-L3: Prior posterior decompression and fusion. Osteophytic ridging. Facet hypertrophy. Mild canal stenosis, similar. Limited evaluation of foramina with likely mild bilateral foraminal stenosis. L3-L4: Prior posterior fusion and decompression. Patent canal and foramina. L4-L5: Prior posterior fusion and decompression. Patent canal with probably mild bilateral foraminal stenosis. L5-S1: Prior posterior fusion and decompression. Patent canal with mild left foraminal stenosis. IMPRESSION: MRI cervical spine: 1. Similar moderate canal stenosis at C4-C5 and C6-C7 2. Similar severe foraminal stenosis on the right at C4-C5 and moderate to severe foraminal stenosis on the left at C3-C4. 3. 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 4. Nonspecific ill-defined edema in the posterior paraspinal soft tissues, which could represent strain or cellulitis. MRI thoracic and lumbar spine: 1. Status post extension of lumbar fusion superiorly to T10. Nonspecific postoperative edema and fluid collection in the posterior paraspinal soft tissues centered at T12, which could represent a seroma but abscess is not excluded given reported purulent drainage. There also is nonspecific fluid surrounding the posterior elements at L2-L3. 2. Mild edema within the discs at T10-T11 and T12-L1, which could be degenerative or stress related but early discitis is difficult to exclude given the clinical history. No  clearly infectious findings within the canal, although evaluation is limited by metallic artifact and motion. If there is clinical concern for infection, consider correlation with inflammatory markers and close interval follow-up MRI with contrast as clinically indicated. 3. Improved canal stenosis and foraminal stenosis at L1-L2 status post decompression and fusion. 4. Mild to moderate canal stenosis at T9-T10 and T10-T11 appears similar versus slightly progressed. Similar mild canal stenosis at T7-T8. Electronically Signed   By: Margaretha Sheffield M.D.   On: 11/26/2020 16:39   MR Lumbar Spine W Wo Contrast  Result Date: 11/26/2020 CLINICAL DATA:  Spinal fusion, thoracic, follow up Epidural abscess; Spinal fusion, lumbosacral, follow up EXAM: MRI CERVICAL, THORACIC AND LUMBAR SPINE WITHOUT AND WITH CONTRAST TECHNIQUE: Multiplanar and multiecho pulse sequences of the cervical spine, to include the craniocervical junction and cervicothoracic junction, and thoracic and lumbar spine, were obtained without and with intravenous contrast. CONTRAST:  67m GADAVIST GADOBUTROL 1 MMOL/ML IV SOLN COMPARISON:  CT myelogram September 03, 2020. FINDINGS: MRI CERVICAL SPINE FINDINGS Significantly motion limited study.  Within this limitation: Alignment: Similar alignment with reversal of the normal cervical lordosis and grade 1 anterolisthesis of C3 on C4, C4 on C5, and C6 on C7. Vertebrae: Motion limited evaluation. Degenerative/discogenic endplate signal changes at multiple levels. Otherwise, no obvious marrow edema to suggest acute fracture or discitis/osteomyelitis. Cord: Motion limited evaluation without definite cord signal abnormality. Posterior Fossa, vertebral arteries, paraspinal tissues: Visualized vertebral artery flow voids are maintained. No acute findings in the visualized posterior fossa. There is nonspecific ill-defined edema in the posterior paraspinal soft tissues. Disc levels: C2-C3: No significant disc  protrusion, foraminal stenosis, or canal stenosis. C3-C4: Posterior disc osteophyte complex with left greater than right facet and uncovertebral hypertrophy. Resulting moderate to severe left foraminal stenosis and mild canal stenosis. C4-C5: Posterior disc osteophyte complex with right greater than left facet and uncovertebral hypertrophy.  Resulting severe right foraminal stenosis and moderate canal stenosis. Flattening of the ventral cord. C5-C6: Posterior disc osteophyte complex with mild bilateral facet and uncovertebral hypertrophy. Mild canal stenosis without significant foraminal stenosis. C6-C7: Posterior disc osteophyte complex with central inferiorly dissecting disc protrusion versus calcified PLL (as seen on prior myelogram). Resulting moderate canal stenosis. Bilateral facet and uncovertebral hypertrophy with mild to moderate bilateral foraminal stenosis. C7-T1: Posterior disc osteophyte complex with left greater than right facet and uncovertebral hypertrophy. Central disc protrusion with inferior extension. Resulting mild-to-moderate canal stenosis and mild bilateral foraminal stenosis. MRI THORACIC SPINE FINDINGS Alignment:  Normal. Vertebrae: Interval posterior fusion spanning from T10 inferiorly into the lumbar spine. Nonspecific mild edema within the disc spaces at T10-T11, T11-T12, and T12-L1. Cord:  Normal cord signal. Paraspinal and other soft tissues: Postoperative fluid collection in the posterior paraspinal soft tissues in the lower thoracic spine and lumbar spine (detailed further below). Disc levels: Mild Mild disc bulging at multiple upper to mid thoracic levels. At T7-T8 right paracentral disc protrusion flattens the ventral cord with mild canal stenosis, similar to prior. At T8-T9, disc bulging and endplate spurring results in mild canal stenosis, similar. At T9-T10, evaluation is limited by metallic artifact; however, posterior disc bulging appears to result in mild to moderate canal  stenosis, possibly progressed from the prior but poorly characterized. Interval extension of the patient's lumbar fusion superiorly to T10. At T10-T11, the canal appears to be mild to moderately moderately narrowed, similar versus slightly progressed. At T11-T12 the canal appears to be patent. MRI LUMBAR SPINE FINDINGS Motion limited evaluation.  Within this limitation: Segmentation:  Standard. Alignment:  Normal. Vertebrae:  Interval posterior fusion spanning from T10 to S1. Conus medullaris and cauda equina: Conus extends to the L1-L2 level. Conus appears normal. Paraspinal and other soft tissues: Postoperative edema in the paraspinal soft tissues with fluid collection in the paraspinal soft tissues measuring up to 8 x 3 x 63 mm, centered at T12-L1. There is also nonspecific fluid surrounding the posterior elements bilaterally at L2-L3. Disc levels: T12-L1: Posterior fusion. Mild canal stenosis. Limited evaluation of foramina due to metallic artifact. L1-L2: Posterior fusion and decompression. Improved canal stenosis with patent canal. Limited evaluation of the foramina due to metallic artifact with visualized portions appearing patent. L2-L3: Prior posterior decompression and fusion. Osteophytic ridging. Facet hypertrophy. Mild canal stenosis, similar. Limited evaluation of foramina with likely mild bilateral foraminal stenosis. L3-L4: Prior posterior fusion and decompression. Patent canal and foramina. L4-L5: Prior posterior fusion and decompression. Patent canal with probably mild bilateral foraminal stenosis. L5-S1: Prior posterior fusion and decompression. Patent canal with mild left foraminal stenosis. IMPRESSION: MRI cervical spine: 1. Similar moderate canal stenosis at C4-C5 and C6-C7 2. Similar severe foraminal stenosis on the right at C4-C5 and moderate to severe foraminal stenosis on the left at C3-C4. 3. Similar inferiorly dissecting disc protrusion versus ossified PLL behind the C7 vertebral body with  mild-to-moderate canal stenosis at C7-T1. 4. Nonspecific ill-defined edema in the posterior paraspinal soft tissues, which could represent strain or cellulitis. MRI thoracic and lumbar spine: 1. Status post extension of lumbar fusion superiorly to T10. Nonspecific postoperative edema and fluid collection in the posterior paraspinal soft tissues centered at T12, which could represent a seroma but abscess is not excluded given reported purulent drainage. There also is nonspecific fluid surrounding the posterior elements at L2-L3. 2. Mild edema within the discs at T10-T11 and T12-L1, which could be degenerative or stress related but early discitis is difficult  to exclude given the clinical history. No clearly infectious findings within the canal, although evaluation is limited by metallic artifact and motion. If there is clinical concern for infection, consider correlation with inflammatory markers and close interval follow-up MRI with contrast as clinically indicated. 3. Improved canal stenosis and foraminal stenosis at L1-L2 status post decompression and fusion. 4. Mild to moderate canal stenosis at T9-T10 and T10-T11 appears similar versus slightly progressed. Similar mild canal stenosis at T7-T8. Electronically Signed   By: Margaretha Sheffield M.D.   On: 11/26/2020 16:39   CT Abdomen Pelvis W Contrast  Result Date: 11/23/2020 CLINICAL DATA:  Abdominal pain and abdominal distension. EXAM: CT ABDOMEN AND PELVIS WITH CONTRAST TECHNIQUE: Multidetector CT imaging of the abdomen and pelvis was performed using the standard protocol following bolus administration of intravenous contrast. CONTRAST:  41m OMNIPAQUE IOHEXOL 350 MG/ML SOLN COMPARISON:  None. FINDINGS: Lower chest: There is atelectasis in the lung bases. Coronary artery calcifications are present. Hepatobiliary: There is a rounded hypodensity in the left lobe of the liver which is too small to characterize, most likely a small cyst or hemangioma. There is no  biliary ductal dilatation. There are no gallstones identified. Pancreas: Unremarkable. No pancreatic ductal dilatation or surrounding inflammatory changes. Spleen: Normal in size without focal abnormality. Adrenals/Urinary Tract: The bilateral adrenal glands are within normal limits. There is no hydronephrosis or perinephric fluid. There is a rounded hypodensity in the right renal cortex which is too small to characterize, likely a cyst. 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 The bladder is distended with contrast and within normal limits. Stomach/Bowel: There is no free air. Proximal colon is distended with air-fluid levels. The distal colon is decompressed. There is no definitive transition point visualized. There is sigmoid colon diverticulosis without evidence for acute diverticulitis. Additionally, small bowel loops are distended with air-fluid levels measuring up to 3.2 cm. No definite transition point visualized. The stomach appears within normal limits. Appendix is within normal limits. Vascular/Lymphatic: Aortic atherosclerosis. No enlarged abdominal or pelvic lymph nodes. Reproductive: Prostate is unremarkable. Other: There are small fat containing bilateral inguinal hernias. There is also small fat containing umbilical hernia. There is no ascites. Musculoskeletal: T10-S1 posterior fusion hardware is present. Vertebroplasty changes are seen at T12 and L1. IMPRESSION: 1. Mildly dilated small bowel with air-fluid levels. Findings are concerning for ileus/enteritis. No definite transition point visualized. Partial small bowel obstruction is not excluded. 2. Air-fluid levels distend the proximal colon and the distal colon is relatively decompressed. No definite transition point visualized. Findings can be seen with colonic ileus and/or diarrhea. 3. Sigmoid colon diverticulosis without evidence for diverticulitis. Electronically Signed   By: ARonney AstersM.D.   On:  11/23/2020 22:54   [place]domen Limited  Result Date: 11/27/2020 CLINICAL DATA:  Elevated LFTs and abdominal pain EXAM: ULTRASOUND ABDOMEN LIMITED RIGHT UPPER QUADRANT COMPARISON:  11/23/2020 CT FINDINGS: Gallbladder: No gallstones or wall thickening visualized. No sonographic Murphy sign noted by sonographer. Common bile duct: Diameter: 4.36 mm Liver: Hypodensity seen on recent CT examination is not well appreciated on today's exam. Portal vein is patent on color Doppler imaging with normal direction of blood flow towards the liver. Other: Minimal free fluid is noted in the right upper quadrant. Right-sided pleural effusion is noted. IMPRESSION: Minimal free fluid in the abdomen Small right-sided pleural effusion. No other focal abnormality is noted. Electronically Signed   By: MInez CatalinaM.D.   On: 11/27/2020 22:11  DG CHEST PORT 1 VIEW  Result Date: 11/29/2020 CLINICAL DATA:  Encounter for removal of tunneled central venous catheter. EXAM: PORTABLE CHEST 1 VIEW COMPARISON:  Chest radiograph 11/27/2020, CT 11/24/2020 FINDINGS: New right upper extremity PICC tip in the lower SVC. Enteric tube in place with tip and side-port below the diaphragm. Hazy opacity throughout the right hemithorax may represent layering pleural effusion, atelectasis, or combination there of. Low lung volumes persist. Stable heart size and mediastinal contours, heart size accentuated by low volume technique. No pneumothorax. Bronchovascular crowding versus vascular congestion. IMPRESSION: 1. Right upper extremity PICC tip in the lower SVC, new from prior exam. 2. Enteric tube in place. 3. Hazy opacity throughout the right hemithorax may represent layering pleural effusion, atelectasis, or combination of. Electronically Signed   By: Keith Rake M.D.   On: 11/29/2020 18:08   DG CHEST PORT 1 VIEW  Result Date: 11/25/2020 CLINICAL DATA:  Tachypnea. EXAM: PORTABLE CHEST 1 VIEW COMPARISON:  April 20, 2020 FINDINGS: No  pneumothorax. The low volume portable technique limits evaluation. No focal infiltrates or overt edema. The cardiomediastinal silhouette is stable. No other acute abnormalities. IMPRESSION: No acute abnormalities are identified. The study is limited due to the low volume portable technique. Electronically Signed   By: Dorise Bullion III M.D.   On: 11/25/2020 08:15   DG Chest Port 1V same Day  Result Date: 11/27/2020 CLINICAL DATA:  Dyspnea. EXAM: PORTABLE CHEST 1 VIEW COMPARISON:  11/25/2020 FINDINGS: There is a nasogastric tube with tip below the GE junction. Stable cardiomediastinal contours. Low lung volumes with asymmetric elevation of the right hemidiaphragm. No airspace opacities. No pleural effusion or edema. IMPRESSION: Low lung volumes.  No acute findings. Electronically Signed   By: Kerby Moors M.D.   On: 11/27/2020 09:01   DG Abd Portable 1V  Result Date: 11/28/2020 CLINICAL DATA:  Follow-up ileus EXAM: PORTABLE ABDOMEN - 1 VIEW COMPARISON:  11/27/2020 FINDINGS: Stable bowel gas pattern is noted throughout the large and small bowel consistent with the known history of ileus. No free air is seen. Postsurgical changes in the lumbar and thoracic spine are noted. No other focal abnormality is noted. IMPRESSION: Stable ileus pattern when compare with the prior day. Electronically Signed   By: Inez Catalina M.D.   On: 11/28/2020 15:31   DG Abd Portable 1V  Result Date: 11/27/2020 CLINICAL DATA:  Follow-up ileus EXAM: PORTABLE ABDOMEN - 1 VIEW COMPARISON:  11/26/2020 FINDINGS: Similar ileus pattern with gas in small and large bowel. Nasogastric tube enters the stomach. Previously seen rectal tube has been removed. No finding to suggest free air on the supine images. IMPRESSION: Persistent similar ileus pattern. Electronically Signed   By: Nelson Chimes M.D.   On: 11/27/2020 13:43   DG Abd Portable 1V  Result Date: 11/25/2020 CLINICAL DATA:  Continued abdominal pain. EXAM: PORTABLE ABDOMEN - 1  VIEW COMPARISON:  CT scan of the abdomen November 23, 2020 FINDINGS: Pedicle rods and screws are seen from the lower thoracic spine through the sacrum. Lung bases are poorly visualized but unremarkable. No free air, portal venous gas, or pneumatosis. The colon is air-filled and prominent. Colonic gas extends to the level the rectum. The dilated small bowel loops on the recent CT scan are not as well visualized on this study. IMPRESSION: 1. Prominent air-filled colon with colonic gas extending to the level of the rectum. The findings suggest ileus. Previously identified dilated loops of small bowel are not as well visualized on this study.  No other abnormalities. Electronically Signed   By: Dorise Bullion III M.D.   On: 11/25/2020 08:18   ECHOCARDIOGRAM COMPLETE  Result Date: 11/25/2020    ECHOCARDIOGRAM REPORT   Patient Name:   NATHIAN STENCIL Loscalzo Date of Exam: 11/25/2020 Medical Rec #:  [id number]            Height:       64.0 in Accession #:    [id number]           Weight:       188.5 lb Date of Birth:  02-May-1955            BSA:          1.908 m Patient Age:    65 years             BP:           108/74 mmHg Patient Gender: M                    HR:           109 bpm. Exam Location:  [facility] Procedure: 2D Echo, Cardiac Doppler and Color Doppler Indications:    Bacteremia R78.81  History:        Patient has prior history of Echocardiogram examinations, most                 recent 01/13/2019. Risk Factors:Hypertension, Dyslipidemia,                 Former Smoker and Diabetes. Dysrhythmia (From Hx).  Sonographer:    Alvino Chapel RCS Referring Phys: Pershing  1. Left ventricular ejection fraction, by estimation, is 60 to 65%. The left ventricle has normal function. The left ventricle has no regional wall motion abnormalities. There is mild concentric left ventricular hypertrophy. Left ventricular diastolic parameters were normal.  2. Right ventricular systolic function is mildly  reduced. The right ventricular size is moderately enlarged. Tricuspid regurgitation signal is inadequate for assessing PA pressure.  3. The mitral valve is grossly normal. Trivial mitral valve regurgitation. No evidence of mitral stenosis.  4. The aortic valve is tricuspid. Aortic valve regurgitation is not visualized. No aortic stenosis is present.  5. The inferior vena cava is normal in size with <50% respiratory variability, suggesting right atrial pressure of 8 mmHg. Comparison(s): Changes from prior study are noted. LV function remains unchanged. RV appears moderately dilated with mildly reduced function which is new from prior study. Conclusion(s)/Recommendation(s): No evidence of valvular vegetations on this transthoracic echocardiogram. Would recommend a transesophageal echocardiogram to exclude infective endocarditis if clinically indicated. FINDINGS  Left Ventricle: Left ventricular ejection fraction, by estimation, is 60 to 65%. The left ventricle has normal function. The left ventricle has no regional wall motion abnormalities. The left ventricular internal cavity size was normal in size. There is  mild concentric left ventricular hypertrophy. Left ventricular diastolic parameters were normal. Right Ventricle: The right ventricular size is moderately enlarged. No increase in right ventricular wall thickness. Right ventricular systolic function is mildly reduced. Tricuspid regurgitation signal is inadequate for assessing PA pressure. Left Atrium: Left atrial size was normal in size. Right Atrium: Right atrial size was normal in size. Pericardium: Trivial pericardial effusion is present. Presence of pericardial fat pad. Mitral Valve: The mitral valve is grossly normal. Trivial mitral valve regurgitation. No evidence of mitral valve stenosis. Tricuspid Valve: The tricuspid valve is grossly normal. Tricuspid valve regurgitation is trivial. No evidence  of tricuspid stenosis. Aortic Valve: The aortic valve is  tricuspid. Aortic valve regurgitation is not visualized. No aortic stenosis is present. Pulmonic Valve: The pulmonic valve was grossly normal. Pulmonic valve regurgitation is trivial. No evidence of pulmonic stenosis. Aorta: The aortic root is normal in size and structure. Venous: The inferior vena cava is normal in size with less than 50% respiratory variability, suggesting right atrial pressure of 8 mmHg. IAS/Shunts: The atrial septum is grossly normal.  LEFT VENTRICLE PLAX 2D LVIDd:         4.45 cm  Diastology LVIDs:         2.80 cm  LV e' medial:    7.29 cm/s LV PW:         1.30 cm  LV E/e' medial:  12.1 LV IVS:        1.10 cm  LV e' lateral:   10.69 cm/s LVOT diam:     2.10 cm  LV E/e' lateral: 8.3 LV SV:         72 LV SV Index:   37 LVOT Area:     3.46 cm  RIGHT VENTRICLE RV S prime:     14.60 cm/s TAPSE (M-mode): 2.0 cm LEFT ATRIUM             Index       RIGHT ATRIUM           Index LA diam:        2.80 cm 1.47 cm/m  RA Area:     16.60 cm LA Vol (A2C):   54.8 ml 28.73 ml/m RA Volume:   42.10 ml  22.07 ml/m LA Vol (A4C):   53.7 ml 28.15 ml/m LA Biplane Vol: 57.3 ml 30.04 ml/m  AORTIC VALVE LVOT Vmax:   111.50 cm/s LVOT Vmean:  78.900 cm/s LVOT VTI:    0.207 m  AORTA Ao Root diam: 3.70 cm MITRAL VALVE MV Area (PHT): 4.07 cm     SHUNTS MV Decel Time: 187 msec     Systemic VTI:  0.21 m MV E velocity: 88.50 cm/s   Systemic Diam: 2.10 cm MV A velocity: 112.00 cm/s MV E/A ratio:  0.79 Eleonore Chiquito MD Electronically signed by Eleonore Chiquito MD Signature Date/Time: 11/25/2020/3:42:15 PM    Final    ECHO TEE  Result Date: 12/03/2020    TRANSESOPHOGEAL ECHO REPORT   Patient Name:   HELEN CUFF Eunice Date of Exam: 12/03/2020 Medical Rec #:  [id number]            Height:       64.0 in Accession #:    [id number]           Weight:       211.2 lb Date of Birth:  25-Feb-1956            BSA:          2.002 m Patient Age:    75 years             BP:           139/81 mmHg Patient Gender: M                    HR:            74 bpm. Exam Location:  [facility] Procedure: Transesophageal Echo, Cardiac Doppler and Color Doppler Indications:     Bacteremia  History:         Patient has prior history of Echocardiogram examinations, most  recent 11/25/2020. Arrythmias:Atrial Flutter,                  Signs/Symptoms:Bacteremia and MSSA, resp. failure; Risk                  Factors:Hypertension and Dyslipidemia.  Sonographer:     Dustin Flock RDCS Referring Phys:  [phone] Ria Comment B ROBERTS Diagnosing Phys: Candee Furbish MD PROCEDURE: The transesophogeal probe was passed without difficulty through the esophogus of the patient. Sedation performed by different physician. The patient was monitored while under deep sedation. Anesthestetic sedation was provided intravenously by Anesthesiology: 105.27m of Propofol. The patient's vital signs; including heart rate, blood pressure, and oxygen saturation; remained stable throughout the procedure. The patient developed no complications during the procedure. IMPRESSIONS  1. Left ventricular ejection fraction, by estimation, is 60 to 65%. The left ventricle has normal function. The left ventricle has no regional wall motion abnormalities.  2. Right ventricular systolic function is normal. The right ventricular size is normal.  3. No left atrial/left atrial appendage thrombus was detected.  4. The mitral valve is normal in structure. Trivial mitral valve regurgitation. No evidence of mitral stenosis.  5. The aortic valve is normal in structure. Aortic valve regurgitation is not visualized. No aortic stenosis is present.  6. The inferior vena cava is normal in size with greater than 50% respiratory variability, suggesting right atrial pressure of 3 mmHg. Conclusion(s)/Recommendation(s): Normal biventricular function without evidence of hemodynamically significant valvular heart disease. No evidence of vegetation/infective endocarditis on this transesophageal echocardiogram. FINDINGS  Left  Ventricle: Left ventricular ejection fraction, by estimation, is 60 to 65%. The left ventricle has normal function. The left ventricle has no regional wall motion abnormalities. The left ventricular internal cavity size was normal in size. There is  no left ventricular hypertrophy. Right Ventricle: The right ventricular size is normal. No increase in right ventricular wall thickness. Right ventricular systolic function is normal. Left Atrium: Left atrial size was normal in size. No left atrial/left atrial appendage thrombus was detected. Right Atrium: Right atrial size was normal in size. Pericardium: There is no evidence of pericardial effusion. Mitral Valve: The mitral valve is normal in structure. Trivial mitral valve regurgitation. No evidence of mitral valve stenosis. Tricuspid Valve: The tricuspid valve is normal in structure. Tricuspid valve regurgitation is not demonstrated. No evidence of tricuspid stenosis. Aortic Valve: The aortic valve is normal in structure. Aortic valve regurgitation is not visualized. No aortic stenosis is present. Pulmonic Valve: The pulmonic valve was normal in structure. Pulmonic valve regurgitation is not visualized. No evidence of pulmonic stenosis. Aorta: The aortic root is normal in size and structure. Venous: The inferior vena cava is normal in size with greater than 50% respiratory variability, suggesting right atrial pressure of 3 mmHg. IAS/Shunts: No atrial level shunt detected by color flow Doppler. MCandee FurbishMD Electronically signed by MCandee FurbishMD Signature Date/Time: 12/03/2020/2:50:03 PM    Final    [place] SITE RITE  Result Date: 11/29/2020 If Site Rite image not attached, placement could not be confirmed due to current cardiac rhythm.   Labs: BNP (last 3 results) Recent Labs    11/27/20 0851  BNP 2818.5   Basic Metabolic Panel: Recent Labs  Lab 12/02/20 0430 12/03/20 0400 12/04/20 0448 12/05/20 0457 12/06/20 0354  NA 135 138 136 135 137  K  2.9* 3.4* 3.3* 3.5 3.3*  CL 97* 97* 94* 95* 97*  CO2 29 34* 34* 33* 34*  GLUCOSE 125* 136*  104* 121* 107*  BUN 5* <5* <5* <5* <5*  CREATININE 0.74 0.68 0.68 0.68 0.62  CALCIUM 7.8* 7.9* 7.7* 7.7* 8.0*  MG 1.5* 1.8 2.0 1.8 1.8  PHOS 5.9* 3.3 3.2 3.0  --    Liver Function Tests: Recent Labs  Lab 12/01/20 0904 12/02/20 0430 12/03/20 0400 12/04/20 0448 12/05/20 0457  AST _0 ALT _1 <5 6  ALKPHOS 39 39 41 46 50  BILITOT 1.5* 1.1 1.3* 1.3* 1.2  PROT 4.8* 4.6* 4.7* 4.7* 5.0*  ALBUMIN 1.7* 1.6* 1.7* 1.6* 1.7*   No results for input(s): LIPASE, AMYLASE in the last 168 hours. No results for input(s): AMMONIA in the last 168 hours. CBC: Recent Labs  Lab 12/01/20 0904 12/02/20 0430 12/03/20 0400 12/04/20 0448 12/05/20 0457 12/06/20 0354  WBC 7.2 6.4 6.2 4.8 7.1 7.5  NEUTROABS 4.8 4.3 5.6 3.0 5.1  --   HGB 8.6* 8.6* 8.7* 8.5* 8.6* 8.5*  HCT 26.0* 25.5* 26.2* 25.5* 26.2* 26.4*  MCV 93.5 92.7 94.2 94.4 94.9 96.0  PLT 197 227 248 229 243 230   Cardiac Enzymes: No results for input(s): CKTOTAL, CKMB, CKMBINDEX, TROPONINI in the last 168 hours. BNP: Invalid input(s): POCBNP CBG: Recent Labs  Lab 12/06/20 2014 12/07/20 0119 12/07/20 0320 12/07/20 0751 12/07/20 1132  GLUCAP 105* 124* 135* 121* 128*   D-Dimer No results for input(s): DDIMER in the last 72 hours. Hgb A1c No results for input(s): HGBA1C in the last 72 hours. Lipid Profile No results for input(s): CHOL, HDL, LDLCALC, TRIG, CHOLHDL, LDLDIRECT in the last 72 hours. Thyroid function studies No results for input(s): TSH, T4TOTAL, T3FREE, THYROIDAB in the last 72 hours.  Invalid input(s): FREET3 Anemia work up No results for input(s): VITAMINB12, FOLATE, FERRITIN, TIBC, IRON, RETICCTPCT in the last 72 hours. Urinalysis    Component Value Date/Time   COLORURINE YELLOW 11/26/2020 1531   APPEARANCEUR CLOUDY (A) 11/26/2020 1531   LABSPEC 1.023 11/26/2020 1531   PHURINE 5.0 11/26/2020 1531    GLUCOSEU NEGATIVE 11/26/2020 1531   HGBUR MODERATE (A) 11/26/2020 1531   BILIRUBINUR NEGATIVE 11/26/2020 1531   KETONESUR 20 (A) 11/26/2020 1531   PROTEINUR 100 (A) 11/26/2020 1531   NITRITE NEGATIVE 11/26/2020 1531   LEUKOCYTESUR NEGATIVE 11/26/2020 1531   Sepsis Labs Invalid input(s): PROCALCITONIN,  WBC,  LACTICIDVEN Microbiology Recent Results (from the past 240 hour(s))  Aerobic Culture w Gram Stain (superficial specimen)     Status: None   Collection Time: 11/27/20  1:41 PM   Specimen: Wound  Result Value Ref Range Status   Specimen Description WOUND  Final   Special Requests SOFT TISSUE ABCESS  Final   Gram Stain   Final    ABUNDANT WBC PRESENT,BOTH PMN AND MONONUCLEAR RARE GRAM POSITIVE COCCI Performed at [facility], [place] [address], [place], [place] [address]    Culture FEW STAPHYLOCOCCUS AUREUS  Final   Report Status 11/29/2020 FINAL  Final   Organism ID, Bacteria STAPHYLOCOCCUS AUREUS  Final      Susceptibility   Staphylococcus aureus - MIC*    CIPROFLOXACIN <=0.5 SENSITIVE Sensitive     ERYTHROMYCIN <=0.25 SENSITIVE Sensitive     GENTAMICIN <=0.5 SENSITIVE Sensitive     OXACILLIN <=0.25 SENSITIVE Sensitive     TETRACYCLINE <=1 SENSITIVE Sensitive     VANCOMYCIN 1 SENSITIVE Sensitive     TRIMETH/SULFA <=10 SENSITIVE Sensitive     CLINDAMYCIN <=0.25 SENSITIVE Sensitive     RIFAMPIN <=0.5 SENSITIVE  Sensitive     Inducible Clindamycin NEGATIVE Sensitive     * FEW STAPHYLOCOCCUS AUREUS  Surgical PCR screen     Status: Abnormal   Collection Time: 11/27/20  1:58 PM   Specimen: Nasal Mucosa; Nasal Swab  Result Value Ref Range Status   MRSA, PCR NEGATIVE NEGATIVE Final   Staphylococcus aureus POSITIVE (A) NEGATIVE Final    Comment: (NOTE) The Xpert SA Assay (FDA approved for NASAL specimens in patients 61 years of age and older), is one component of a comprehensive surveillance program. It is not intended to diagnose infection nor to guide or monitor  treatment. Performed at [facility], [place] [address], [place], [place] [address]   Surgical pcr screen     Status: None   Collection Time: 12/03/20  8:49 AM   Specimen: Nasal Mucosa; Nasal Swab  Result Value Ref Range Status   MRSA, PCR NEGATIVE NEGATIVE Final   Staphylococcus aureus NEGATIVE NEGATIVE Final    Comment: (NOTE) The Xpert SA Assay (FDA approved for NASAL specimens in patients 68 years of age and older), is one component of a comprehensive surveillance program. It is not intended to diagnose infection nor to guide or monitor treatment. Performed at [facility], [place] [address], [place], [place] [address]   Resp Panel by RT-PCR (Flu A&B, Covid) Nasopharyngeal Swab     Status: None   Collection Time: 12/04/20  5:38 PM   Specimen: Nasopharyngeal Swab; Nasopharyngeal(NP) swabs in vial transport medium  Result Value Ref Range Status   SARS Coronavirus 2 by RT PCR NEGATIVE NEGATIVE Final    Comment: (NOTE) SARS-CoV-2 target nucleic acids are NOT DETECTED.  The SARS-CoV-2 RNA is generally detectable in upper respiratory specimens during the acute phase of infection. The lowest concentration of SARS-CoV-2 viral copies this assay can detect is 138 copies/mL. A negative result does not preclude SARS-Cov-2 infection and should not be used as the sole basis for treatment or other patient management decisions. A negative result may occur with  improper specimen collection/handling, submission of specimen other than nasopharyngeal swab, presence of viral mutation(s) within the areas targeted by this assay, and inadequate number of viral copies(<138 copies/mL). A negative result must be combined with clinical observations, patient history, and epidemiological information. The expected result is Negative.  Fact Sheet for Patients:  EntrepreneurPulse.com.au  Fact Sheet for Healthcare Providers:  IncredibleEmployment.be  This test  is no t yet approved or cleared by the [place] FDA and  has been authorized for detection and/or diagnosis of SARS-CoV-2 by FDA under an Emergency Use Authorization (EUA). This EUA will remain  in effect (meaning this test can be used) for the duration of the COVID-19 declaration under Section 564(b)(1) of the Act, 21 U.S.C.section 360bbb-3(b)(1), unless the authorization is terminated  or revoked sooner.       Influenza A by PCR NEGATIVE NEGATIVE Final   Influenza B by PCR NEGATIVE NEGATIVE Final    Comment: (NOTE) The Xpert Xpress SARS-CoV-2/FLU/RSV plus assay is intended as an aid in the diagnosis of influenza from Nasopharyngeal swab specimens and should not be used as a sole basis for treatment. Nasal washings and aspirates are unacceptable for Xpert Xpress SARS-CoV-2/FLU/RSV testing.  Fact Sheet for Patients: EntrepreneurPulse.com.au  Fact Sheet for Healthcare Providers: IncredibleEmployment.be  This test is not yet approved or cleared by the [place] FDA and has been authorized for detection and/or diagnosis of SARS-CoV-2 by FDA under an Emergency Use Authorization (EUA). This EUA  will remain in effect (meaning this test can be used) for the duration of the COVID-19 declaration under Section 564(b)(1) of the Act, 21 U.S.C. section 360bbb-3(b)(1), unless the authorization is terminated or revoked.  Performed at [facility], [place] [address], [place], [place] [address]   Resp Panel by RT-PCR (Flu A&B, Covid) Nasopharyngeal Swab     Status: None   Collection Time: 12/07/20 10:11 AM   Specimen: Nasopharyngeal Swab; Nasopharyngeal(NP) swabs in vial transport medium  Result Value Ref Range Status   SARS Coronavirus 2 by RT PCR NEGATIVE NEGATIVE Final    Comment: (NOTE) SARS-CoV-2 target nucleic acids are NOT DETECTED.  The SARS-CoV-2 RNA is generally detectable in upper respiratory specimens during the acute phase of  infection. The lowest concentration of SARS-CoV-2 viral copies this assay can detect is 138 copies/mL. A negative result does not preclude SARS-Cov-2 infection and should not be used as the sole basis for treatment or other patient management decisions. A negative result may occur with  improper specimen collection/handling, submission of specimen other than nasopharyngeal swab, presence of viral mutation(s) within the areas targeted by this assay, and inadequate number of viral copies(<138 copies/mL). A negative result must be combined with clinical observations, patient history, and epidemiological information. The expected result is Negative.  Fact Sheet for Patients:  EntrepreneurPulse.com.au  Fact Sheet for Healthcare Providers:  IncredibleEmployment.be  This test is no t yet approved or cleared by the [place] FDA and  has been authorized for detection and/or diagnosis of SARS-CoV-2 by FDA under an Emergency Use Authorization (EUA). This EUA will remain  in effect (meaning this test can be used) for the duration of the COVID-19 declaration under Section 564(b)(1) of the Act, 21 U.S.C.section 360bbb-3(b)(1), unless the authorization is terminated  or revoked sooner.       Influenza A by PCR NEGATIVE NEGATIVE Final   Influenza B by PCR NEGATIVE NEGATIVE Final    Comment: (NOTE) The Xpert Xpress SARS-CoV-2/FLU/RSV plus assay is intended as an aid in the diagnosis of influenza from Nasopharyngeal swab specimens and should not be used as a sole basis for treatment. Nasal washings and aspirates are unacceptable for Xpert Xpress SARS-CoV-2/FLU/RSV testing.  Fact Sheet for Patients: EntrepreneurPulse.com.au  Fact Sheet for Healthcare Providers: IncredibleEmployment.be  This test is not yet approved or cleared by the [place] FDA and has been authorized for detection and/or diagnosis of SARS-CoV-2  by FDA under an Emergency Use Authorization (EUA). This EUA will remain in effect (meaning this test can be used) for the duration of the COVID-19 declaration under Section 564(b)(1) of the Act, 21 U.S.C. section 360bbb-3(b)(1), unless the authorization is terminated or revoked.  Performed at [facility], [place] [address] [place], [place] [address]      Time coordinating discharge: 35 minutes  SIGNED: Antonieta Pert, MD  [facility] 12/07/2020, 12:53 PM  If 7PM-7AM, please contact night-coverage [email or web address]

## 2020-12-06 NOTE — Consult Note (Signed)
WOC consulted to advise dressing for patient for DC to SNF. Reviewed TOC notes, patient to have NPWT machine available at facility upon his arrival.  Will order  NPWT dressing to be removed here and replaced when he arrives to facility.    [place], [place], [place]

## 2020-12-07 DIAGNOSIS — R7881 Bacteremia: Secondary | ICD-10-CM

## 2020-12-07 DIAGNOSIS — B9561 Methicillin susceptible Staphylococcus aureus infection as the cause of diseases classified elsewhere: Secondary | ICD-10-CM

## 2020-12-07 LAB — GLUCOSE, CAPILLARY
Glucose-Capillary: 124 mg/dL — ABNORMAL HIGH (ref 70–99)
Glucose-Capillary: 135 mg/dL — ABNORMAL HIGH (ref 70–99)
Glucose-Capillary: 121 mg/dL — ABNORMAL HIGH (ref 70–99)
Glucose-Capillary: 128 mg/dL — ABNORMAL HIGH (ref 70–99)

## 2020-12-07 LAB — RESP PANEL BY RT-PCR (FLU A&B, COVID) ARPGX2
Influenza A by PCR: NEGATIVE
Influenza B by PCR: NEGATIVE
SARS Coronavirus 2 by RT PCR: NEGATIVE

## 2020-12-07 MED ORDER — HEPARIN SOD (PORK) LOCK FLUSH 100 UNIT/ML IV SOLN
250.0000 [IU] | INTRAVENOUS | Status: AC | PRN
  Administered 2020-12-07: 250 [IU]
  Filled 2020-12-07: qty 3

## 2020-12-07 MED ORDER — SODIUM CHLORIDE 0.9% FLUSH: 10.0000 mL | INTRAVENOUS | Status: DC | PRN

## 2020-12-07 NOTE — Progress Notes (Signed)
Patient seen and examined. Facility was unable to accept him yesterday, stayed overnight this morning doing well.  Had walked with PT yesterday ambulated to bathroom this morning.  Appears somewhat anxious but overall stable for discharge to skilled nursing facility today.  We will do COVID rapid swab prior to discharge.  Discharge summary updated
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
## 2020-12-07 NOTE — Progress Notes (Signed)
[facility] twice unable to give report no answer.

## 2020-12-07 NOTE — Consult Note (Addendum)
WOC follow-up:  Called bedside nurse to determine if Vac dressing needs to be changed to back.  She states it has already been removed and a wet to dry dressing has been applied to prepare for the patient's discharge today.  Orders have been provided for the facility to re-apply the Vac after the patient has been transferred.  Please re-consult if further assistance is needed.  Thank-you,  Julien Girt MSN, [place], [place], [place], [place]

## 2020-12-07 NOTE — Progress Notes (Signed)
DISCHARGE NOTE SNF Danny Hampton to be discharged [facility] per MD order. Patient verbalized understanding.  Skin clean, dry and intact without evidence of skin break down, no evidence of skin tears noted. IV catheter discontinued intact. Site without signs and symptoms of complications. Dressing and pressure applied. Pt denies pain at the site currently. No complaints noted.  Patient has right arm PICC line for antibiotic.    Discharge packet assembled. An After Visit Summary (AVS) was printed and given to the EMS personnel. Patient escorted via stretcher and discharged to [facility] via ambulance. Report called to accepting facility; all questions and concerns addressed.   Arlyss Repress, RN

## 2020-12-07 NOTE — TOC Transition Note (Signed)
Transition of Care [facility]) - CM/SW Discharge Note [facility] *Room 415-2 *Number for Report: [phone]   Patient Details  Name: Danny Hampton MRN: [id number] Date of Birth: [birth year]  Transition of Care [facility]) CM/SW Contact:  Sable Feil, LCSW Phone Number: 12/07/2020, 10:20 AM   Clinical Narrative:  Patient discharging to [facility] for [place] rehab today. Transport arranged with [facility]. CSW visited with patient to update him regarding his discharge today, and when asked, Danny Hampton informed CSW that he will call his friend. Discharge clinicals transmitted to facility.     Final next level of care: [facility]) Barriers to Discharge: Barriers Resolved   Patient Goals and CMS Choice Patient states their goals for this hospitalization and ongoing recovery are:: Patient agreeable to [place] rehab CMS Medicare.gov Compare Post Acute Care list provided to:: Patient Choice offered to / list presented to :  (Sig other)  Discharge Placement   Existing PASRR number confirmed : 12/03/20          Patient chooses bed at: [facility] Patient to be transferred to facility by: Non-emergency ambulance transport Name of family member notified: Jamse Mead - [phone] Patient and family notified of of transfer: 12/07/20  Discharge Plan and Services In-house Referral: Clinical Social Work   Post Acute Care Choice: Skilled Nursing Facility                    HH Arranged: IV Antibiotics HH Agency: Tour manager Date HH Agency Contacted: 11/25/20 Time [place]: 37 Representative spoke with at [place]: [place] Determinants of Health ([place]) Interventions  No SDOH interventions requested or needed at discharge.    Readmission Risk Interventions No flowsheet data found.

## 2021-01-08 ENCOUNTER — Inpatient Hospital Stay: Payer: No Typology Code available for payment source | Admitting: Internal Medicine

## 2021-01-09 ENCOUNTER — Ambulatory Visit (INDEPENDENT_AMBULATORY_CARE_PROVIDER_SITE_OTHER): Payer: No Typology Code available for payment source | Admitting: Internal Medicine

## 2021-01-09 ENCOUNTER — Other Ambulatory Visit: Payer: Self-pay

## 2021-01-09 VITALS — BP 113/76 | HR 82 | Temp 98.4°F | Resp 16 | Ht 64.0 in | Wt 187.3 lb

## 2021-01-09 DIAGNOSIS — R7881 Bacteremia: Secondary | ICD-10-CM | POA: Diagnosis not present

## 2021-01-09 DIAGNOSIS — T8149XA Infection following a procedure, other surgical site, initial encounter: Secondary | ICD-10-CM

## 2021-01-09 DIAGNOSIS — B9561 Methicillin susceptible Staphylococcus aureus infection as the cause of diseases classified elsewhere: Secondary | ICD-10-CM

## 2021-01-09 DIAGNOSIS — T847XXD Infection and inflammatory reaction due to other internal orthopedic prosthetic devices, implants and grafts, subsequent encounter: Secondary | ICD-10-CM

## 2021-01-09 MED ORDER — CEFADROXIL 1 G PO TABS: 1.0000 g | ORAL_TABLET | Freq: Two times a day (BID) | ORAL | 11 refills | Status: DC

## 2021-01-09 NOTE — Patient Instructions (Addendum)
Please follow up with your neurosurgeon  Your wound for the most part looks ok today. There is slight tissue/fibrin exudates and not pus   Will need to remove picc  Start cefadroxil 1 gram twice a day. This will be indefinite duration, but perhaps decreased dose   No cephalexin (which if you have to use now, it would be 4 times a day)   See [place] in 4 weeks Labs today

## 2021-01-09 NOTE — Progress Notes (Addendum)
[facility]  Patient Active Problem List   Diagnosis Date Noted   Demoralization 12/01/2020   Ileus (HCC)    Abnormal transaminases    Staphylococcus aureus bacteremia 11/25/2020   Postoperative wound infection 11/25/2020   Ileus, postoperative ([place]) 11/25/2020   Acute exacerbation of chronic low back pain 11/25/2020   Intractable abdominal pain 11/24/2020   Abdominal distension 11/24/2020   Generalized abdominal pain    Spinal stenosis of lumbar region 10/31/2020   Primary hypertension    Hyperlipidemia    Acute respiratory failure with hypoxia ([place]) 04/21/2020   Hyponatremia 04/21/2020   Hypoalbuminemia 04/21/2020   GERD (gastroesophageal reflux disease) 04/21/2020   Hyperglycemia due to diabetes mellitus ([place]) 04/21/2020   Obesity (BMI 30-39.9) 04/21/2020   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   MRN: [id number]  No chief complaint on file.   HPI:  Danny Hampton is a 65 y.o. adult back surgery 03/4429 complicated by surgical site infection and mssa bacteremia, here for f/u  I reviewed chart  Patient admitted 9/17; mssa; surgical wound abscess I&D 9/18 same mssa Tee negative for endocarditis Patient discharged on cefazolin planned till 11/02 and transition to oral abx  Patient had a wound vac in hospital but none on discharge  ------- 11/02 id clinic f/u Patinet doing very well, improved appetite No pain at the back wound Patient has home health come to pack wound He is to see nsg within a week Wound still opened/packed No f/c No n/v/diarrhea No  rash Slight fibrinous discharge around wound He hasnot yet taken shower due to wound still open  Reviewed opat labs only 11/1 available and no crp    No Known Allergies    Outpatient Medications Prior to Visit  Medication Sig Dispense Refill   acetaminophen (TYLENOL) 325 MG tablet Take 2 tablets (650 mg total) by mouth every 6 (six) hours as needed for mild pain, headache or fever.     albuterol (VENTOLIN HFA) 108 (90 Base) MCG/ACT inhaler Inhale 2 puffs into the lungs every 6 (six) hours as needed for wheezing or shortness of breath. (Patient not taking: Reported on 11/23/2020) 8 g 1   aspirin 81 MG chewable tablet Chew 1 tablet (81 mg total) by mouth daily.     atorvastatin (LIPITOR) 80 MG tablet Take 40 mg by mouth daily.      bisacodyl (DULCOLAX) 10 MG suppository Place 1 suppository (10 mg total) rectally daily as needed for moderate constipation. 12 suppository 0   Carbidopa-Levodopa ER 48.75-195 MG CPCR Take 1 tablet by mouth 4 (four) times daily.     carvedilol (COREG) 25 MG tablet Take 1 tablet (25 mg total) by mouth 2 (two) times daily with a meal.     ceFAZolin (ANCEF) IVPB Inject 2 g into the vein every 8 (eight) hours. Indication:  Bacteremia First Dose: Yes Last Day of Therapy:  01/09/21 Labs - Once weekly:  CBC/D and BMP, Labs - Every other week:  ESR and CRP Method of administration: IV Push Method of administration may be changed at the discretion of  home infusion pharmacist based upon assessment of the patient and/or caregiver's ability to self-administer the medication ordered. 102 Units 0   Cholecalciferol (VITAMIN D3) 125 MCG (5000 UT) TABS Take 5,000 Units by mouth daily.      cyclobenzaprine (FLEXERIL) 10 MG tablet Take 1 tablet (10 mg total) by mouth 3 (three) times daily as needed for muscle spasms. 30 tablet 0   diltiazem (CARDIZEM CD) 240 MG 24 hr capsule Take 1 capsule (240 mg total) by mouth daily.     docusate sodium (COLACE) 100 MG capsule Take 100 mg by  mouth daily.     guaiFENesin-dextromethorphan (ROBITUSSIN DM) 100-10 MG/5ML syrup Take 10 mLs by mouth every 6 (six) hours as needed for cough. (Patient not taking: Reported on 11/23/2020) 236 mL 0   hydrOXYzine (ATARAX/VISTARIL) 25 MG tablet Take 1 tablet (25 mg total) by mouth 3 (three) times daily as needed for anxiety. 30 tablet 0   loratadine (CLARITIN) 10 MG tablet Take 10 mg by mouth daily.     LORazepam (ATIVAN) 1 MG tablet Take 1 tablet (1 mg total) by mouth 2 (two) times daily as needed for up to 4 doses for anxiety. 4 tablet 0   meloxicam (MOBIC) 15 MG tablet Take 15 mg by mouth daily.     metFORMIN (GLUCOPHAGE) 500 MG tablet Take 500 mg by mouth 2 (two) times daily with a meal.      metoCLOPramide (REGLAN) 5 MG tablet 5 mg twice daily before meals for 2 days 5 mg daily before meals for 2 days then stop     Multiple Vitamin (MULTI-VITAMIN) tablet Take 1 tablet by mouth daily.     oxyCODONE (OXY IR/ROXICODONE) 5 MG immediate release tablet Take 1 tablet (5 mg total) by mouth every 6 (six) hours as needed for up to 5 doses for severe pain (pain). 5 tablet 0   pantoprazole (PROTONIX) 40 MG tablet Take 40 mg by mouth daily.     PARoxetine (PAXIL) 20 MG tablet Take 1 tablet (20 mg total) by mouth 2 (two) times daily.     phenytoin (DILANTIN) 100 MG ER capsule Take 100 mg by mouth 3 (three) times daily.     polyethylene glycol (MIRALAX / GLYCOLAX) 17 g packet Take 17 g by mouth daily. 14 each 0   rOPINIRole (REQUIP) 1 MG tablet Take 0.57m at 6pm and 152mat 9pm. (Patient taking differently: Take 0.5 mg by mouth See admin instructions. Take 0.5 mg by mouth every evening and 6 pm and again at 9 pm) 135 tablet 3   senna (SENOKOT) 8.6 MG TABS tablet Take 1 tablet (8.6 mg total) by mouth 2 (two) times daily. 120 tablet 0   sodium chloride (MURO 128) 5 % ophthalmic solution Place 1 drop into the right eye every 3 (three) hours as needed for eye irritation.     thiamine 100 MG tablet Take 1 tablet (100  mg total) by mouth daily.     No facility-administered medications prior to visit.     Social History   Socioeconomic History   Marital status: Divorced    Spouse name: Not on file   Number of children: 0   Years of education: Not on file   Highest education level: Not on file  Occupational History   Occupation: retired elClinical biochemistTobacco Use   Smoking status: Former    Packs/day: 1.00    Years: 30.00    Pack years: 30.00    Types: Cigarettes  Quit date: 03/2018    Years since quitting: 2.8   Smokeless tobacco: Never  Vaping Use   Vaping Use: Never used  Substance and Sexual Activity   Alcohol use: Not Currently    Comment: occasional drink   Drug use: No   Sexual activity: Not Currently  Other Topics Concern   Not on file  Social History Narrative   Right handed   Social Determinants of Health   Financial Resource Strain: Not on file  Food Insecurity: Not on file  Transportation Needs: Not on file  Physical Activity: Not on file  Stress: Not on file  Social Connections: Not on file  Intimate Partner Violence: Not on file      Review of Systems    All other ros negative   Objective:    Resp 16   Ht _0  (1.626 m)   Wt 187 lb 4.8 oz (85 kg)   BMI 32.15 kg/m  Nursing note and vital signs reviewed.  Physical Exam     General/constitutional: no distress, pleasant HEENT: Normocephalic, PER, Conj Clear, EOMI, Oropharynx clear Neck supple CV: rrr no mrg Lungs: clear to auscultation, normal respiratory effort Abd: Soft, Nontender Ext: no edema Skin: t10 open wound with underlying good granulation tissue and just minimal surrounding erythema; nontender; slight undertracking at edge Neuro: nonfocal MSK: no peripheral joint swelling/tenderness/warmth; back spines nontender  Rue picc site no erythema or purulence  Labs: 11/01 cbc 6/11/322; cr 0.7   No crp tested recently  Micro:  Serology:  Imaging: 9/19 mri entire spine 1. Status  post extension of lumbar fusion superiorly to T10. Nonspecific postoperative edema and fluid collection in the posterior paraspinal soft tissues centered at T12, which could represent a seroma but abscess is not excluded given reported purulent drainage. There also is nonspecific fluid surrounding the posterior elements at L2-L3. 2. Mild edema within the discs at T10-T11 and T12-L1, which could be degenerative or stress related but early discitis is difficult to exclude given the clinical history. No clearly infectious findings within the canal, although evaluation is limited by metallic artifact and motion. If there is clinical concern for infection, consider correlation with inflammatory markers and close interval follow-up MRI with contrast as clinically indicated. 3. Improved canal stenosis and foraminal stenosis at L1-L2 status post decompression and fusion. 4. Mild to moderate canal stenosis at T9-T10 and T10-T11 appears similar versus slightly progressed. Similar mild canal stenosis at T7-T8.  Assessment & Plan:   Problem List Items Addressed This Visit   None Visit Diagnoses     MSSA bacteremia    -  Primary   Relevant Orders   C-reactive protein   Surgical site infection       Relevant Medications   cefadroxil (DURICEF) 1 g tablet   Other Relevant Orders   C-reactive protein   Hardware complicating wound infection, subsequent encounter       Relevant Medications   cefadroxil (DURICEF) 1 g tablet   Other Relevant Orders   C-reactive protein         No orders of the defined types were placed in this encounter.  Abx: Nafcillin --> cefazolin 9/18-11/02 plan  Line:   65 yo male afib, dm2, hx right knee arthroplasty 2020, s/p T10-L3 laminectomy and hardware fixation 10/21/20  admitted mid 11/2020 for mssa bacteremia in setting of back abscess/wound  Tte negative; 9/26 tee negative for endoarditis 9/17 bcx mssa 9/18 repeat bcx negative 9/20 back wound I&D; cx  mssa  Unable to do rifmapin due to ddi Initial nafcillin given encephalopathy but transitioned to cefazolin plan 6 weeks abx with chronic suppression   Patient asked about dental appointment, which is fine to do now, no special abx prophy needed   -remove picc today -start cefadroxil 1 gram twice a day -crp today -see me in 4 weeks -f/u with nsg   Follow-up: Return in about 4 weeks (around 02/06/2021).      Jabier Mutton, [facility] [place] [phone]  pager   [phone] cell 01/09/2021, 10:20 AM

## 2021-01-10 LAB — C-REACTIVE PROTEIN: CRP: 15.6 mg/L — ABNORMAL HIGH (ref <8.0)

## 2021-01-11 ENCOUNTER — Other Ambulatory Visit (HOSPITAL_COMMUNITY): Payer: Self-pay

## 2021-01-28 ENCOUNTER — Telehealth: Payer: Self-pay

## 2021-01-28 NOTE — Telephone Encounter (Addendum)
Danny Hampton with [facility] called stating patient's wound looks great and he does not need to be seen tomorrow. She stated patient is doing well.  [name]

## 2021-01-28 NOTE — Telephone Encounter (Signed)
Patient informed to let surgeon know what is going on as well.

## 2021-01-28 NOTE — Telephone Encounter (Signed)
Patient's girlfriend called stating patient is having some unusual discharge coming from his wound and redness around the area. Patient has also been complaining of feeling warm, but does not have a thermometer at home to check his temp. I advised the patient's girlfriend if patient is not feeling well then he should go to to ER. Patient is refusing to go to the ER or come in to be seen today. Patient would rather be seen by Dr. Gale Journey tomorrow. Patient scheduled for tomorrow and advised to go to the ER if he starts to feel worse. Patient's nurse is scheduled to come out today at 11:30 am today and she will call our office to update [place] on how the patient is doing.  Murline Weigel T Brooks Sailors

## 2021-01-29 ENCOUNTER — Ambulatory Visit: Payer: No Typology Code available for payment source | Admitting: Internal Medicine

## 2021-02-06 ENCOUNTER — Encounter: Payer: Self-pay | Admitting: Internal Medicine

## 2021-02-06 ENCOUNTER — Ambulatory Visit (INDEPENDENT_AMBULATORY_CARE_PROVIDER_SITE_OTHER): Payer: No Typology Code available for payment source | Admitting: Internal Medicine

## 2021-02-06 ENCOUNTER — Other Ambulatory Visit: Payer: Self-pay

## 2021-02-06 VITALS — BP 114/78 | HR 80 | Temp 97.7°F | Wt 200.0 lb

## 2021-02-06 DIAGNOSIS — T8149XA Infection following a procedure, other surgical site, initial encounter: Secondary | ICD-10-CM

## 2021-02-06 DIAGNOSIS — T847XXD Infection and inflammatory reaction due to other internal orthopedic prosthetic devices, implants and grafts, subsequent encounter: Secondary | ICD-10-CM

## 2021-02-06 NOTE — Patient Instructions (Signed)
Continue wound care as per NSG precaution   Continue cefadroxil indefinitely; potentially in a few months if wound looks better, and labs looking better, we might be able to lower dose   See me in around 6-8 weeks  Follow up with neurosurgery

## 2021-02-06 NOTE — Progress Notes (Signed)
[facility]  Patient Active Problem List   Diagnosis Date Noted   Demoralization 12/01/2020   Ileus (HCC)    Abnormal transaminases    Staphylococcus aureus bacteremia 11/25/2020   Postoperative wound infection 11/25/2020   Ileus, postoperative ([place]) 11/25/2020   Acute exacerbation of chronic low back pain 11/25/2020   Intractable abdominal pain 11/24/2020   Abdominal distension 11/24/2020   Generalized abdominal pain    Spinal stenosis of lumbar region 10/31/2020   Primary hypertension    Hyperlipidemia    Acute respiratory failure with hypoxia ([place]) 04/21/2020   Hyponatremia 04/21/2020   Hypoalbuminemia 04/21/2020   GERD (gastroesophageal reflux disease) 04/21/2020   Hyperglycemia due to diabetes mellitus ([place]) 04/21/2020   Obesity (BMI 30-39.9) 04/21/2020   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   MRN: [id number]  Chief Complaint  Patient presents with   Follow-up    MSSA bacteremia      HPI:  Danny Hampton is a 65 y.o. adult back surgery 05/90 complicated by surgical site infection and mssa bacteremia, here for f/u  I reviewed chart  Patient admitted 9/17; mssa; surgical wound abscess I&D 9/18 same mssa Tee negative for endocarditis Patient discharged on cefazolin planned till 11/02 and transition to oral abx  Patient had a wound vac in hospital but none on discharge  ------- 11/02 id clinic f/u Patinet doing very well, improved appetite No pain at the back wound Patient has home health come to pack wound He is to see nsg within a week Wound still  opened/packed No f/c No n/v/diarrhea No rash Slight fibrinous discharge around wound He hasnot yet taken shower due to wound still open  Reviewed opat labs only 11/1 available and no crp  11/30 id f/u Minimal pain in mid back Still open but smaller Hh nurse reports wound looking better Some diarrhea from cefadroxil started 11/02 Feels well otherwise Will see nsg next week   Allergies  Allergen Reactions   Gabapentin Anxiety      Outpatient Medications Prior to Visit  Medication Sig Dispense Refill   acetaminophen (TYLENOL) 325 MG tablet Take 2 tablets (650 mg total) by mouth every 6 (six) hours as needed for mild pain, headache or fever.     aspirin 81 MG chewable tablet Chew 1 tablet (81 mg total) by mouth daily.     atorvastatin (LIPITOR) 80 MG tablet Take 40 mg by mouth daily.      Carbidopa-Levodopa ER 48.75-195 MG CPCR Take 1 tablet by mouth 4 (four) times daily.     cefadroxil (DURICEF) 1 g tablet Take 1 tablet (1 g total) by mouth 2 (two) times daily. 60 tablet 11   Cholecalciferol (VITAMIN D3) 125 MCG (5000 UT) TABS Take 5,000 Units by mouth daily.      hydrOXYzine (ATARAX/VISTARIL) 25 MG tablet Take 1 tablet (25 mg total) by mouth 3 (three) times daily as needed for anxiety. 30 tablet 0   lisinopril (ZESTRIL) 10 MG tablet Take 10 mg by mouth daily.     loratadine (CLARITIN) 10 MG tablet Take 10 mg  by mouth daily.     LORazepam (ATIVAN) 1 MG tablet Take 1 tablet (1 mg total) by mouth 2 (two) times daily as needed for up to 4 doses for anxiety. 4 tablet 0   meloxicam (MOBIC) 15 MG tablet Take 15 mg by mouth daily.     metFORMIN (GLUCOPHAGE) 500 MG tablet Take 500 mg by mouth 2 (two) times daily with a meal.      Multiple Vitamin (MULTI-VITAMIN) tablet Take 1 tablet by mouth daily.     pantoprazole (PROTONIX) 40 MG tablet Take 40 mg by mouth daily.     PARoxetine (PAXIL) 20 MG tablet Take 1 tablet (20 mg total) by mouth 2 (two) times daily. (Patient taking differently:  Take 10 mg by mouth 2 (two) times daily.)     rOPINIRole (REQUIP) 1 MG tablet Take 0.5mg  at 6pm and 1mg  at 9pm. (Patient taking differently: Take 0.5 mg by mouth See admin instructions. Take 0.5 mg by mouth every evening and 6 pm and again at 9 pm) 135 tablet 3   sodium chloride (MURO 128) 5 % ophthalmic solution Place 1 drop into the right eye every 3 (three) hours as needed for eye irritation.     thiamine 100 MG tablet Take 1 tablet (100 mg total) by mouth daily.     albuterol (VENTOLIN HFA) 108 (90 Base) MCG/ACT inhaler Inhale 2 puffs into the lungs every 6 (six) hours as needed for wheezing or shortness of breath. (Patient not taking: Reported on 02/06/2021) 8 g 1   bisacodyl (DULCOLAX) 10 MG suppository Place 1 suppository (10 mg total) rectally daily as needed for moderate constipation. (Patient not taking: Reported on 02/06/2021) 12 suppository 0   carvedilol (COREG) 25 MG tablet Take 1 tablet (25 mg total) by mouth 2 (two) times daily with a meal. (Patient not taking: Reported on 01/09/2021)     diltiazem (CARDIZEM CD) 240 MG 24 hr capsule Take 1 capsule (240 mg total) by mouth daily. (Patient not taking: Reported on 01/09/2021)     docusate sodium (COLACE) 100 MG capsule Take 100 mg by mouth daily. (Patient not taking: Reported on 02/06/2021)     guaiFENesin-dextromethorphan (ROBITUSSIN DM) 100-10 MG/5ML syrup Take 10 mLs by mouth every 6 (six) hours as needed for cough. (Patient not taking: Reported on 11/23/2020) 236 mL 0   oxyCODONE (OXY IR/ROXICODONE) 5 MG immediate release tablet Take 1 tablet (5 mg total) by mouth every 6 (six) hours as needed for up to 5 doses for severe pain (pain). (Patient not taking: Reported on 01/09/2021) 5 tablet 0   phenytoin (DILANTIN) 100 MG ER capsule Take 100 mg by mouth 3 (three) times daily. (Patient not taking: Reported on 02/06/2021)     No facility-administered medications prior to visit.     Social History   Socioeconomic History   Marital status:  Divorced    Spouse name: Not on file   Number of children: 0   Years of education: Not on file   Highest education level: Not on file  Occupational History   Occupation: retired Clinical biochemist  Tobacco Use   Smoking status: Former    Packs/day: 1.00    Years: 30.00    Pack years: 30.00    Types: Cigarettes    Quit date: 03/2018    Years since quitting: 2.9   Smokeless tobacco: Never  Vaping Use   Vaping Use: Never used  Substance and Sexual Activity   Alcohol use: Not Currently    Comment:  occasional drink   Drug use: No   Sexual activity: Not Currently  Other Topics Concern   Not on file  Social History Narrative   Right handed   Social Determinants of Health   Financial Resource Strain: Not on file  Food Insecurity: Not on file  Transportation Needs: Not on file  Physical Activity: Not on file  Stress: Not on file  Social Connections: Not on file  Intimate Partner Violence: Not on file      Review of Systems    All other ros negative   Objective:    BP 114/78   Pulse 80   Temp 97.7 F (36.5 C) (Oral)   Wt 200 lb (90.7 kg)   SpO2 98%   BMI 34.33 kg/m  Nursing note and vital signs reviewed.  Physical Exam     General/constitutional: no distress, pleasant HEENT: Normocephalic, PER, Conj Clear, EOMI, Oropharynx clear Neck supple CV: rrr no mrg Lungs: clear to auscultation, normal respiratory effort Abd: Soft, Nontender Ext: no edema Skin: No Rash -- see wound pic Neuro: nonfocal MSK: no peripheral joint swelling/tenderness/warmth; back spines nontender      Labs:  11/01 cbc 6/11/322; cr 0.7   No crp tested recently  Micro:  Serology:  Imaging: 9/19 mri entire spine 1. Status post extension of lumbar fusion superiorly to T10. Nonspecific postoperative edema and fluid collection in the posterior paraspinal soft tissues centered at T12, which could represent a seroma but abscess is not excluded given reported purulent drainage.  There also is nonspecific fluid surrounding the posterior elements at L2-L3. 2. Mild edema within the discs at T10-T11 and T12-L1, which could be degenerative or stress related but early discitis is difficult to exclude given the clinical history. No clearly infectious findings within the canal, although evaluation is limited by metallic artifact and motion. If there is clinical concern for infection, consider correlation with inflammatory markers and close interval follow-up MRI with contrast as clinically indicated. 3. Improved canal stenosis and foraminal stenosis at L1-L2 status post decompression and fusion. 4. Mild to moderate canal stenosis at T9-T10 and T10-T11 appears similar versus slightly progressed. Similar mild canal stenosis at T7-T8.  Assessment & Plan:   Problem List Items Addressed This Visit   None Visit Diagnoses     Surgical site infection    -  Primary   Relevant Orders   CBC   COMPLETE METABOLIC PANEL WITH GFR   C-reactive protein   Hardware complicating wound infection, subsequent encounter       Relevant Orders   CBC   COMPLETE METABOLIC PANEL WITH GFR   C-reactive protein        No orders of the defined types were placed in this encounter.  Abx: 11/02-c cefadroxil  Nafcillin --> cefazolin 9/18-11/02 plan   65 yo male afib, dm2, hx right knee arthroplasty 2020, s/p T10-L3 laminectomy and hardware fixation 10/21/20  admitted mid 11/2020 for mssa bacteremia in setting of back abscess/wound  Tte negative; 9/26 tee negative for endoarditis 9/17 bcx mssa 9/18 repeat bcx negative 9/20 back wound I&D; cx mssa  Unable to do rifmapin due to ddi Initial nafcillin given encephalopathy but transitioned to cefazolin plan 6 weeks abx with chronic suppression   Patient asked about dental appointment, which is fine to do now, no special abx prophy needed  11/30 id assessment Patient tolerating oral cefadroxil Wound open, smaller, but great granulating  tissue    -labs today -continue cefadroxil 1 gram twice a day -  f/u nsg  -f/u 6-8 weeks   Follow-up: Return in about 6 weeks (around 03/20/2021).      Jabier Mutton, [place] for [facility] [place] [phone]  pager   [phone] cell 02/06/2021, 10:59 AM

## 2021-02-07 LAB — CBC
HCT: 34.6 % — ABNORMAL LOW (ref 38.5–50.0)
Hemoglobin: 11.6 g/dL — ABNORMAL LOW (ref 13.2–17.1)
MCH: 31.5 pg (ref 27.0–33.0)
MCHC: 33.5 g/dL (ref 32.0–36.0)
MCV: 94 fL (ref 80.0–100.0)
MPV: 8.6 fL (ref 7.5–12.5)
Platelets: 237 10*3/uL (ref 140–400)
RBC: 3.68 10*6/uL — ABNORMAL LOW (ref 4.20–5.80)
RDW: 13.4 % (ref 11.0–15.0)
WBC: 5.7 10*3/uL (ref 3.8–10.8)

## 2021-02-07 LAB — COMPLETE METABOLIC PANEL WITH GFR
AG Ratio: 1.5 (calc) (ref 1.0–2.5)
ALT: 13 U/L (ref 9–46)
AST: 19 U/L (ref 10–35)
Albumin: 4.3 g/dL (ref 3.6–5.1)
Alkaline phosphatase (APISO): 87 U/L (ref 35–144)
BUN: 18 mg/dL (ref 7–25)
CO2: 26 mmol/L (ref 20–32)
Calcium: 9.8 mg/dL (ref 8.6–10.3)
Chloride: 99 mmol/L (ref 98–110)
Creat: 0.81 mg/dL (ref 0.70–1.35)
Globulin: 2.8 g/dL (calc) (ref 1.9–3.7)
Glucose, Bld: 124 mg/dL — ABNORMAL HIGH (ref 65–99)
Potassium: 5.4 mmol/L — ABNORMAL HIGH (ref 3.5–5.3)
Sodium: 136 mmol/L (ref 135–146)
Total Bilirubin: 0.2 mg/dL (ref 0.2–1.2)
Total Protein: 7.1 g/dL (ref 6.1–8.1)
eGFR: 98 mL/min/{1.73_m2} (ref >=60)

## 2021-02-07 LAB — C-REACTIVE PROTEIN: CRP: 8.9 mg/L — ABNORMAL HIGH (ref <8.0)

## 2021-02-22 ENCOUNTER — Other Ambulatory Visit: Payer: Self-pay | Admitting: Neurosurgery

## 2021-02-22 ENCOUNTER — Other Ambulatory Visit (HOSPITAL_COMMUNITY): Payer: Self-pay | Admitting: Neurosurgery

## 2021-02-22 DIAGNOSIS — M48061 Spinal stenosis, lumbar region without neurogenic claudication: Secondary | ICD-10-CM

## 2021-04-05 ENCOUNTER — Encounter (HOSPITAL_COMMUNITY): Payer: Self-pay

## 2021-04-05 ENCOUNTER — Ambulatory Visit (HOSPITAL_COMMUNITY)
Admission: RE | Admit: 2021-04-05 | Discharge: 2021-04-05 | Disposition: A | Discharge status: Home / Self Care | Payer: No Typology Code available for payment source | Source: Ambulatory Visit | Attending: Neurosurgery | Admitting: Neurosurgery

## 2021-04-05 ENCOUNTER — Other Ambulatory Visit: Payer: Self-pay

## 2021-04-05 DIAGNOSIS — M48061 Spinal stenosis, lumbar region without neurogenic claudication: Secondary | ICD-10-CM

## 2021-04-05 IMAGING — CT CT L SPINE W/O CM
3 of 4 series · 12 of 33 positions shown, 14 images · non-contrast
Comparison: CT T-spine [DATE]

Lumbar spine MRI [DATE]

CLINICAL DATA: Chronic mid and low back pain

EXAM:
CT THORACIC AND LUMBAR SPINE WITHOUT CONTRAST
TECHNIQUE: Multidetector CT imaging of the thoracic and lumbar spine was
performed without contrast. Multiplanar CT image reconstructions
were also generated.
RADIATION DOSE REDUCTION: This exam was performed according to the
departmental dose-optimization program which includes automated
exposure control, adjustment of the mA and/or kV according to
patient size and/or use of iterative reconstruction technique.

[Series 9: l spine bone · axial · 0.35mm/px · z∈[-438,-282]mm · 4 of 118 slices shown, 5 images]
[im 20/118  soft-tissue]
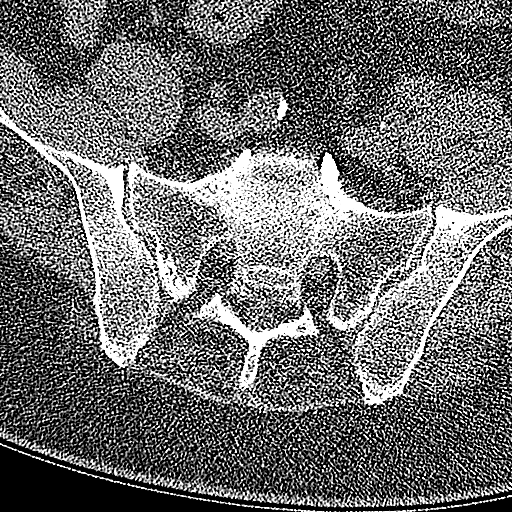
[im 20/118  bone]
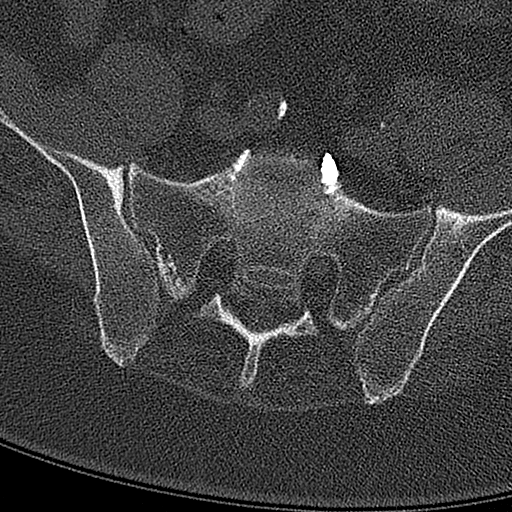
[im 40/118  bone]
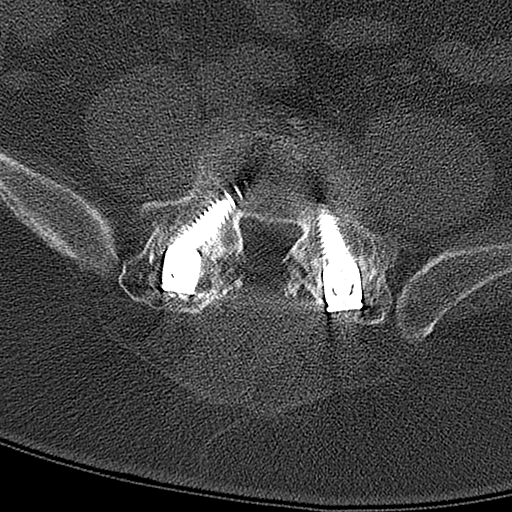
[im 79/118  bone]
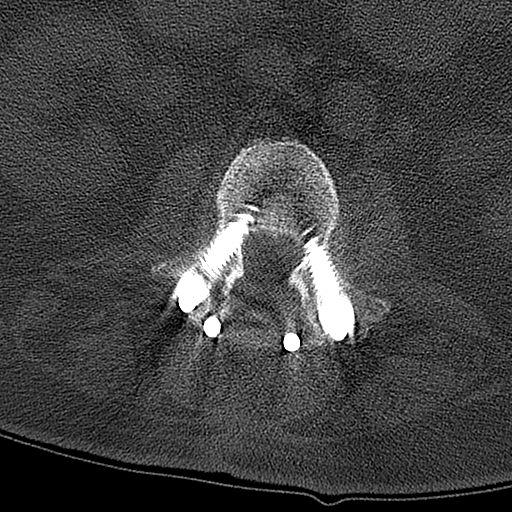
[im 98/118  bone]
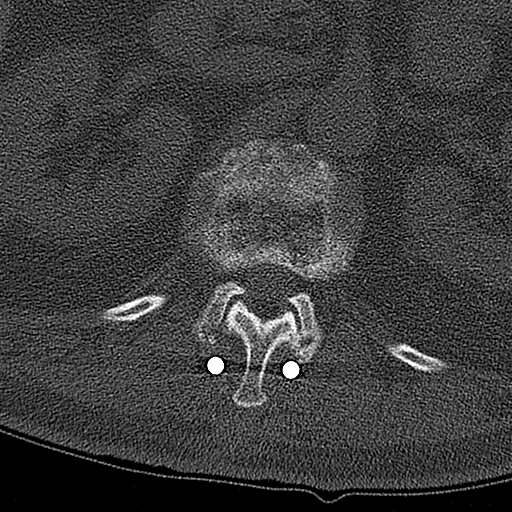

[Series 11: sagittal bone · sagittal · 0.32mm/px · 5 of 71 slices shown, 6 images]
[im 24/71  bone]
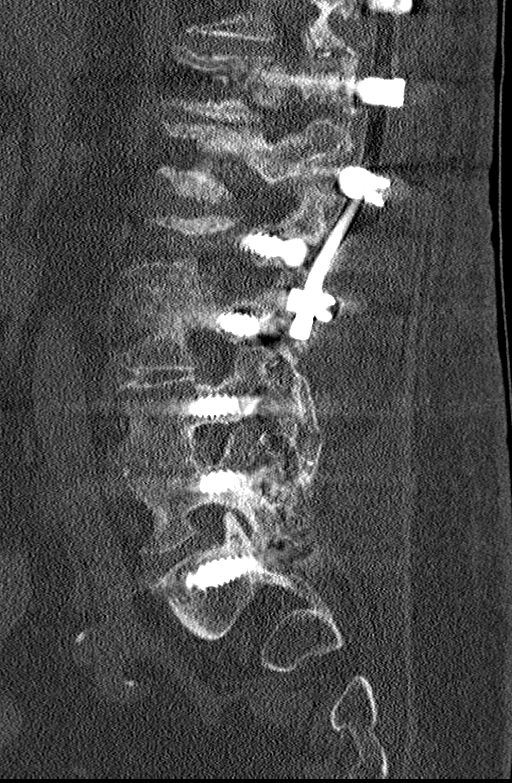
[im 30/71  bone]
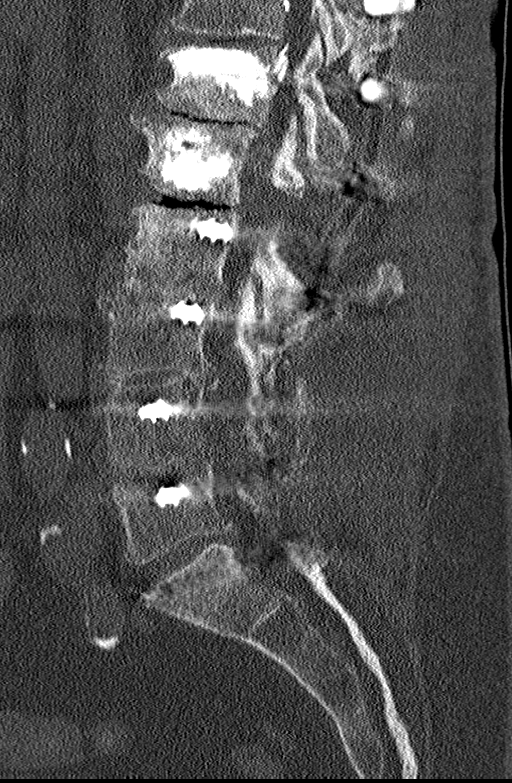
[im 36/71  soft-tissue]
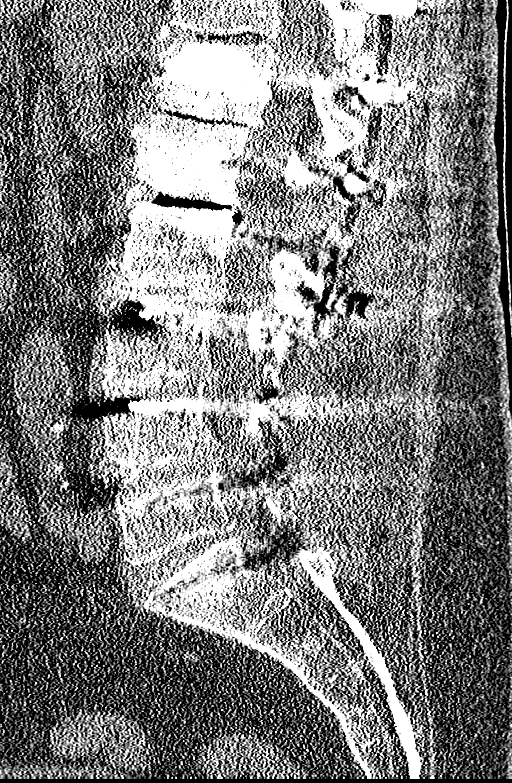
[im 36/71  bone]
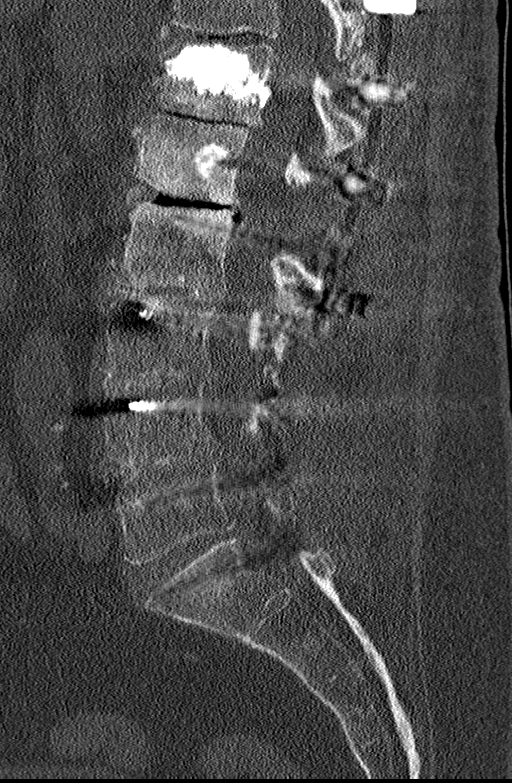
[im 41/71  bone]
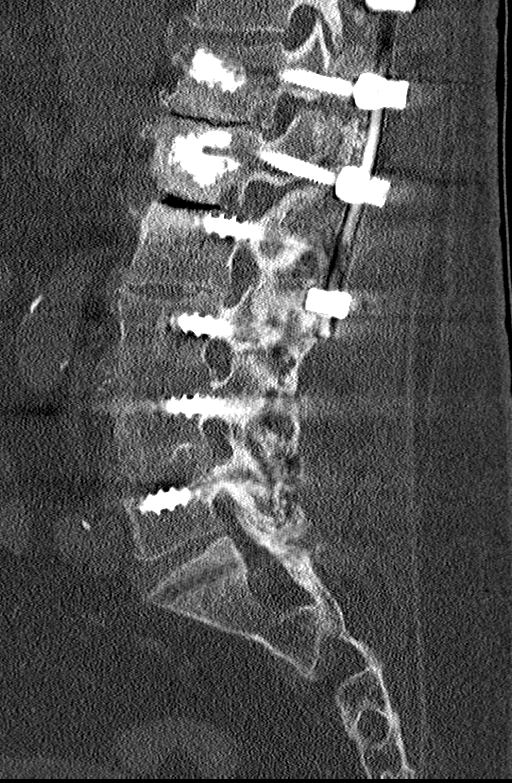
[im 47/71  bone]
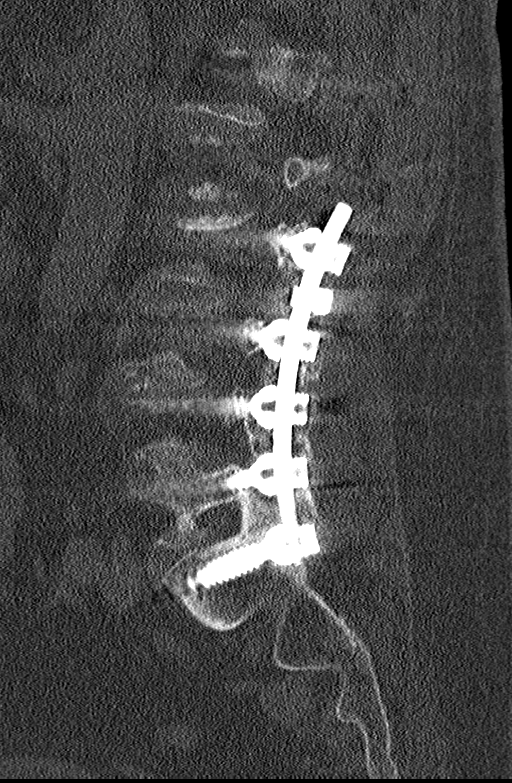

[Series 12: coronal bone · coronal · 0.27mm/px · 3 of 84 slices shown]
[im 17/84  bone]
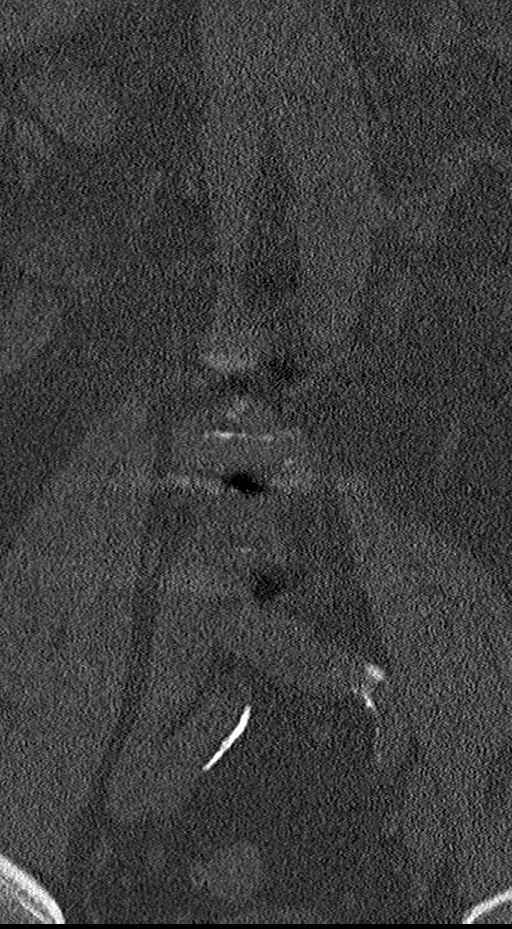
[im 34/84  bone]
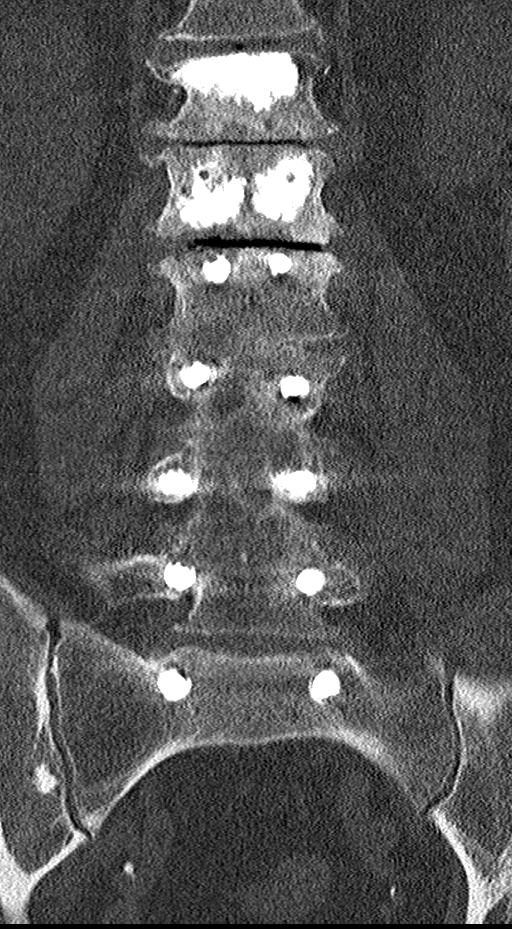
[im 50/84  bone]
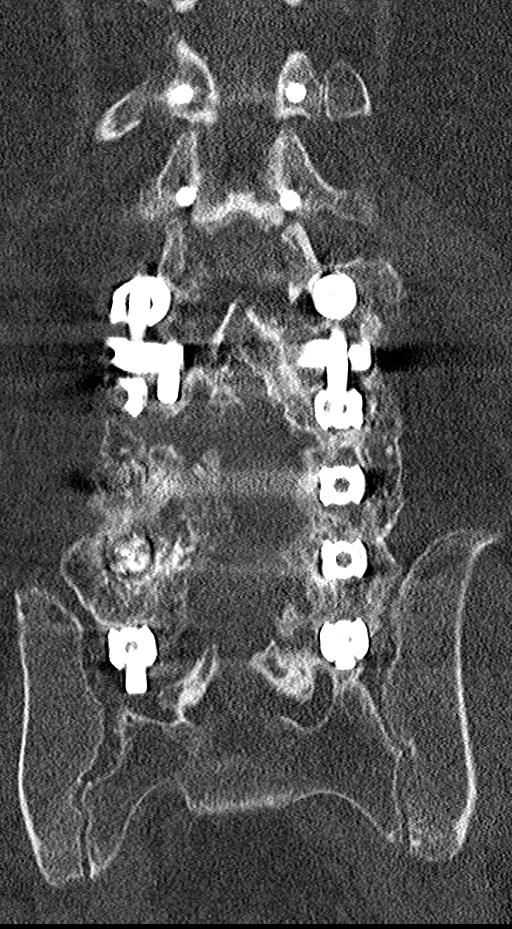

[12 of 33 positions shown; findings below may reference images not displayed]

FINDINGS: CT THORACIC SPINE FINDINGS

Alignment: Normal.

Vertebrae: There is posterior fusion hardware beginning at the T10
level. There is lucency along both T10 transpedicular screws. No
acute fracture.

Paraspinal and other soft tissues: Calcific aortic atherosclerosis.

Disc levels: There is narrowing of the right subarticular recess at
T8-9 due to small disc osteophyte complex. No high-grade spinal
canal stenosis.

CT LUMBAR SPINE FINDINGS

Segmentation: 5 lumbar type vertebrae.

Alignment: Normal.

Vertebrae: Vertebral augmentation at T12 and L1. Posterior spinal
fusion hardware extending to S1. No perihardware lucency.

Paraspinal and other soft tissues: Calcific aortic atherosclerosis.

Disc levels: There is no spinal canal stenosis. The neural foramina
are patent.
IMPRESSION: CT THORACIC SPINE IMPRESSION:

1. Lucency along both T10 transpedicular screws, which may indicate
loosening.
2. Mild narrowing of the right subarticular recess at T8-9 due to
small disc osteophyte complex.

CT LUMBAR SPINE IMPRESSION:

1. Posterior spinal fusion hardware extending to S1 without adverse
features.
2. T12 and L1 vertebral augmentation.
3. No spinal canal stenosis.

Aortic Atherosclerosis ([X0]-[X0]).

## 2021-04-05 IMAGING — CT CT T SPINE W/O CM
3 of 5 series · 10 of 33 positions shown, 12 images · non-contrast
Comparison: CT T-spine [DATE]

Lumbar spine MRI [DATE]

CLINICAL DATA: Chronic mid and low back pain

EXAM:
CT THORACIC AND LUMBAR SPINE WITHOUT CONTRAST
TECHNIQUE: Multidetector CT imaging of the thoracic and lumbar spine was
performed without contrast. Multiplanar CT image reconstructions
were also generated.
RADIATION DOSE REDUCTION: This exam was performed according to the
departmental dose-optimization program which includes automated
exposure control, adjustment of the mA and/or kV according to
patient size and/or use of iterative reconstruction technique.

[Series 504: t spine bone · axial · 0.45mm/px · z∈[-194,-78]mm · 2 of 175 slices shown, 3 images]
[im 59/175  soft-tissue]
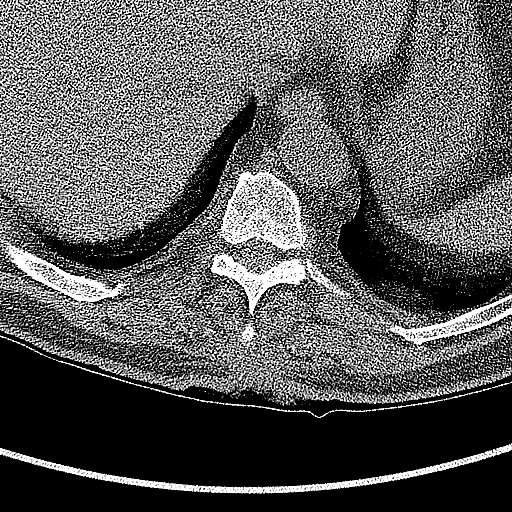
[im 59/175  bone]
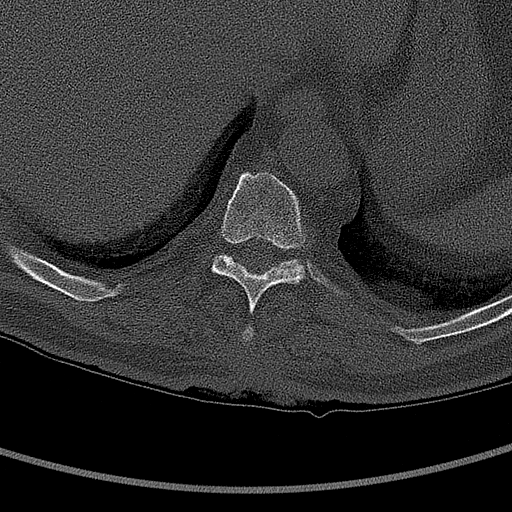
[im 117/175  bone]
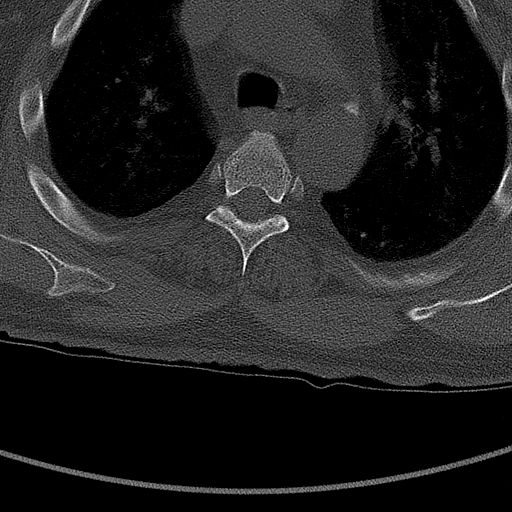

[Series 506: sag bone · sagittal · 0.37mm/px · 5 of 89 slices shown, 6 images]
[im 30/89  bone]
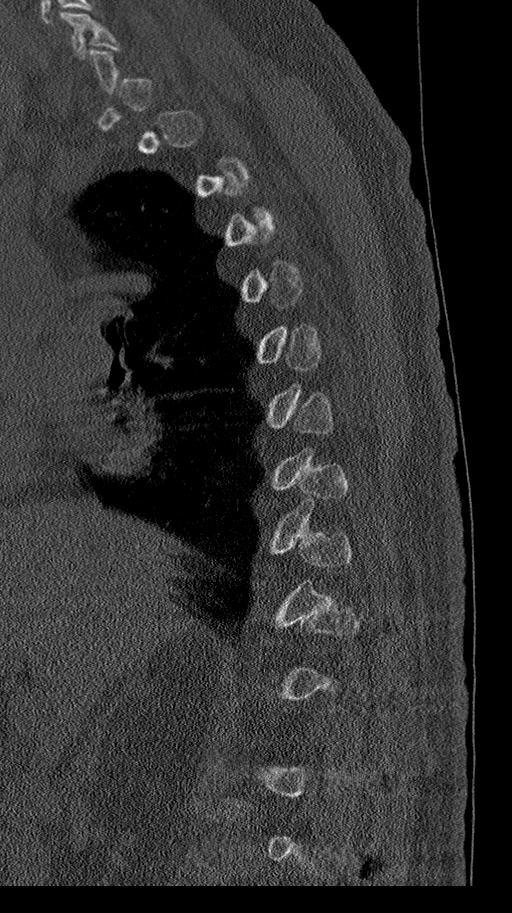
[im 37/89  bone]
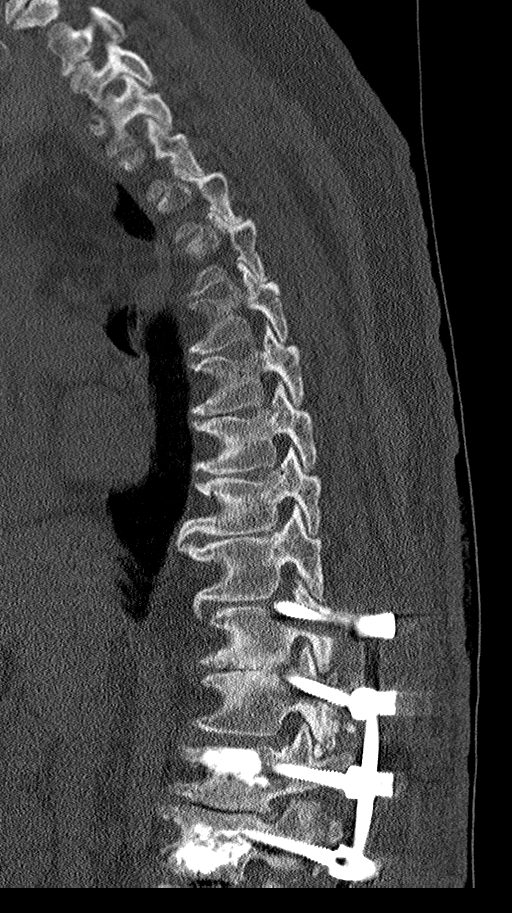
[im 45/89  soft-tissue]
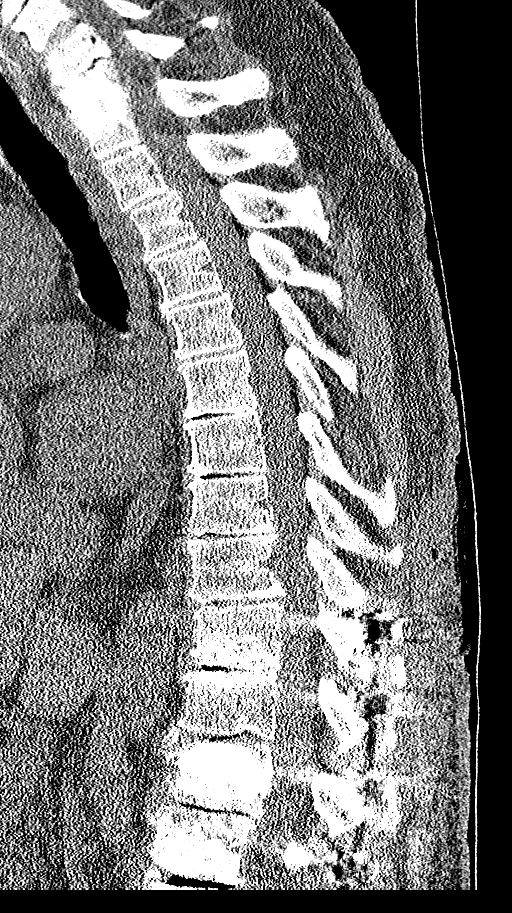
[im 45/89  bone]
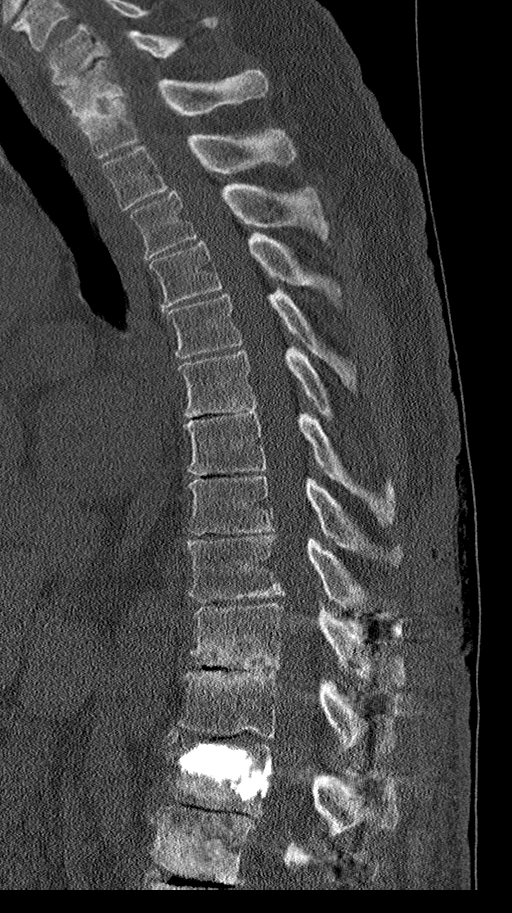
[im 52/89  bone]
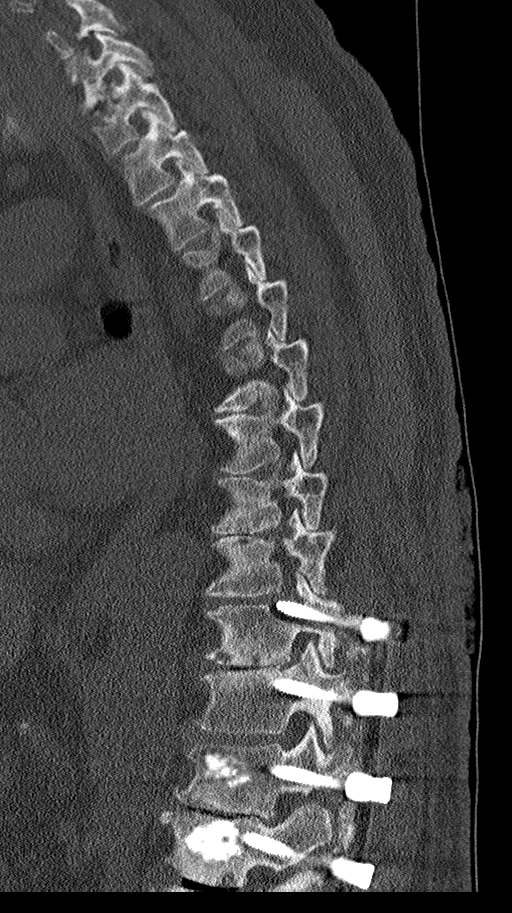
[im 59/89  bone]
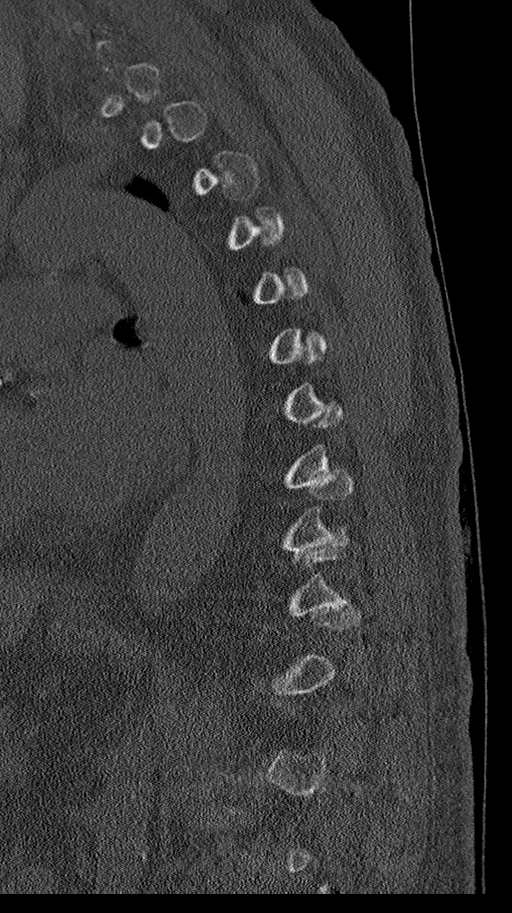

[Series 507: cor bone · coronal · 0.34mm/px · 3 of 94 slices shown]
[im 19/94  bone]
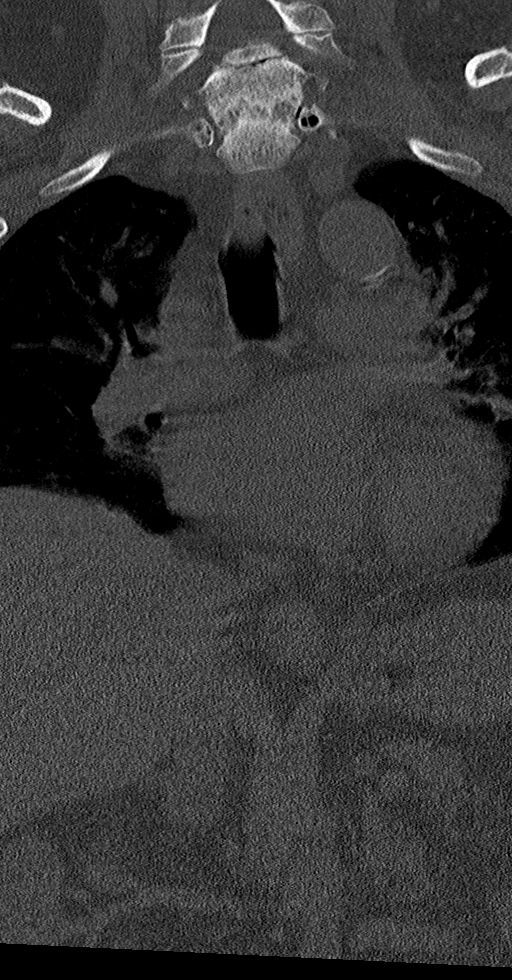
[im 38/94  bone]
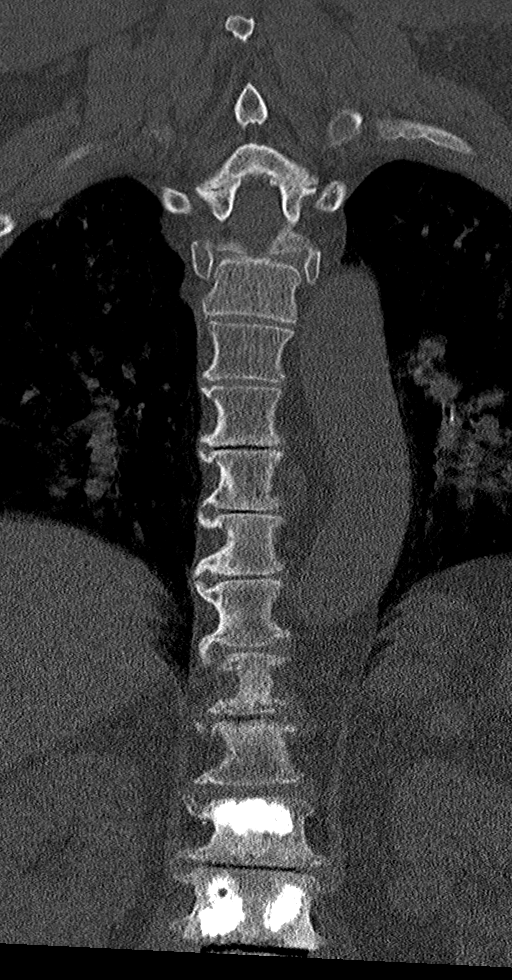
[im 56/94  bone]
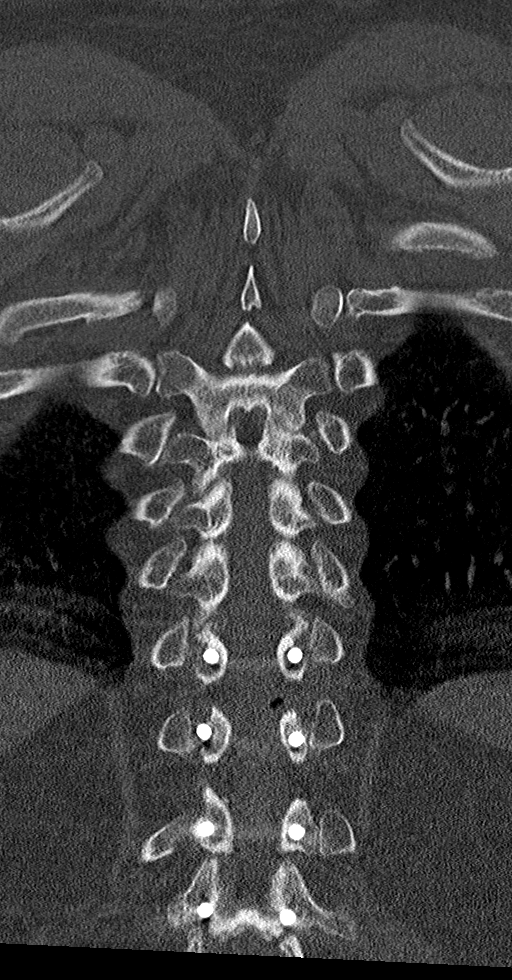

[10 of 33 positions shown; findings below may reference images not displayed]

FINDINGS: CT THORACIC SPINE FINDINGS

Alignment: Normal.

Vertebrae: There is posterior fusion hardware beginning at the T10
level. There is lucency along both T10 transpedicular screws. No
acute fracture.

Paraspinal and other soft tissues: Calcific aortic atherosclerosis.

Disc levels: There is narrowing of the right subarticular recess at
T8-9 due to small disc osteophyte complex. No high-grade spinal
canal stenosis.

CT LUMBAR SPINE FINDINGS

Segmentation: 5 lumbar type vertebrae.

Alignment: Normal.

Vertebrae: Vertebral augmentation at T12 and L1. Posterior spinal
fusion hardware extending to S1. No perihardware lucency.

Paraspinal and other soft tissues: Calcific aortic atherosclerosis.

Disc levels: There is no spinal canal stenosis. The neural foramina
are patent.
IMPRESSION: CT THORACIC SPINE IMPRESSION:

1. Lucency along both T10 transpedicular screws, which may indicate
loosening.
2. Mild narrowing of the right subarticular recess at T8-9 due to
small disc osteophyte complex.

CT LUMBAR SPINE IMPRESSION:

1. Posterior spinal fusion hardware extending to S1 without adverse
features.
2. T12 and L1 vertebral augmentation.
3. No spinal canal stenosis.

Aortic Atherosclerosis ([X0]-[X0]).

## 2021-04-09 ENCOUNTER — Ambulatory Visit: Payer: No Typology Code available for payment source | Admitting: Internal Medicine

## 2021-04-11 ENCOUNTER — Other Ambulatory Visit: Payer: Self-pay

## 2021-04-11 ENCOUNTER — Ambulatory Visit (INDEPENDENT_AMBULATORY_CARE_PROVIDER_SITE_OTHER): Payer: No Typology Code available for payment source | Admitting: Infectious Disease

## 2021-04-11 ENCOUNTER — Encounter: Payer: Self-pay | Admitting: Infectious Disease

## 2021-04-11 VITALS — BP 109/77 | HR 72 | Temp 97.4°F | Wt 199.0 lb

## 2021-04-11 DIAGNOSIS — R7881 Bacteremia: Secondary | ICD-10-CM

## 2021-04-11 DIAGNOSIS — Z96651 Presence of right artificial knee joint: Secondary | ICD-10-CM

## 2021-04-11 DIAGNOSIS — I10 Essential (primary) hypertension: Secondary | ICD-10-CM

## 2021-04-11 DIAGNOSIS — T847XXA Infection and inflammatory reaction due to other internal orthopedic prosthetic devices, implants and grafts, initial encounter: Secondary | ICD-10-CM | POA: Insufficient documentation

## 2021-04-11 DIAGNOSIS — T8149XA Infection following a procedure, other surgical site, initial encounter: Secondary | ICD-10-CM

## 2021-04-11 DIAGNOSIS — B9561 Methicillin susceptible Staphylococcus aureus infection as the cause of diseases classified elsewhere: Secondary | ICD-10-CM

## 2021-04-11 DIAGNOSIS — T847XXD Infection and inflammatory reaction due to other internal orthopedic prosthetic devices, implants and grafts, subsequent encounter: Secondary | ICD-10-CM

## 2021-04-11 HISTORY — DX: Infection and inflammatory reaction due to other internal orthopedic prosthetic devices, implants and grafts, initial encounter: T84.7XXA

## 2021-04-11 MED ORDER — CEFADROXIL 500 MG PO CAPS: 1000.0000 mg | ORAL_CAPSULE | Freq: Two times a day (BID) | ORAL | 11 refills | Status: DC

## 2021-04-11 NOTE — Progress Notes (Signed)
Subjective:  Complaint increasing drainage from his postoperative wound  Patient ID: Danny Hampton, adult    DOB: 1955-08-24, 66 y.o.   MRN: [id number]  HPI  Danny Hampton is a 66 year old Caucasian veteran with history of spinal surgery in August 5852 complicated by surgical site infection and MSSA bacteremia.  There is been concerned that hardware is involved and he has been on protracted cefadroxil after completing his cefazolin.  Fortunately did not have endocarditis on TEE.  Rifampin was not a possibility as an adjunct of therapy due drug drug interactions.  His pain level seems relatively stable compared to previously.  Drainage from the wound itself however has worsened in the last 2 weeks.  He says that at one point he was walking and thought he had "stepped in dog shit."  Does not 1 point when his wound dressing was being taken down his wife had to leave the room because she was going to vomit.  He is having increasing soaking of the wound with purulent material compared to previously.  He had a CT of the lumbar and thoracic spine performed on 27 January did show a lucency along both T10 transpedicular screws which may indicate loosening.  He is scheduled see Dr. Saintclair Halsted tomorrow.    Past Medical History:  Diagnosis Date   Acid reflux    Anxiety    Arrhythmia    paroxysmal supreventricular tachycardia   Arthritis    Complication of anesthesia    Myotonia congenita   Dysrhythmia    Essential hypertension    Family history of adverse reaction to anesthesia    Sister   High cholesterol    History of skin cancer    Myotonia congenita    Type 2 diabetes mellitus ([place])     Past Surgical History:  Procedure Laterality Date   APPLICATION OF WOUND VAC  11/27/2020   Procedure: APPLICATION OF WOUND VAC;  Surgeon: Kristeen Miss, MD;  Location: [facility];  Service: Neurosurgery;;   BACK SURGERY     times 2   CARPAL TUNNEL RELEASE     CATARACT EXTRACTION W/PHACO Left 01/31/2019    Procedure: CATARACT EXTRACTION PHACO AND INTRAOCULAR LENS PLACEMENT (IOC);  Surgeon: Baruch Goldmann, MD;  Location: [facility];  Service: Ophthalmology;  Laterality: Left;  CDE: 2.66   LUMBAR WOUND DEBRIDEMENT N/A 11/27/2020   Procedure: LUMBAR WOUND DEBRIDEMENT AND WASHOUT;  Surgeon: Kristeen Miss, MD;  Location: [facility];  Service: Neurosurgery;  Laterality: N/A;   NOSE SURGERY     TEE WITHOUT CARDIOVERSION N/A 12/03/2020   Procedure: TRANSESOPHAGEAL ECHOCARDIOGRAM (TEE);  Surgeon: Jerline Pain, MD;  Location: [facility]OS[facility];  Service: Cardiovascular;  Laterality: N/A;   TOTAL KNEE ARTHROPLASTY Right 10/12/2018   Procedure: RIGHT TOTAL KNEE ARTHROPLASTY;  Surgeon: Garald Balding, MD;  Location: [facility];  Service: Orthopedics;  Laterality: Right;    Family History  Problem Relation Age of Onset   Healthy Mother    Healthy Father    Cancer Father       Social History   Socioeconomic History   Marital status: Divorced    Spouse name: Not on file   Number of children: 0   Years of education: Not on file   Highest education level: Not on file  Occupational History   Occupation: retired Clinical biochemist  Tobacco Use   Smoking status: Former    Packs/day: 1.00    Years: 30.00    Pack years: 30.00    Types: Cigarettes  Quit date: 03/2018    Years since quitting: 3.0   Smokeless tobacco: Never  Vaping Use   Vaping Use: Never used  Substance and Sexual Activity   Alcohol use: Not Currently    Comment: occasional drink   Drug use: No   Sexual activity: Not Currently  Other Topics Concern   Not on file  Social History Narrative   Right handed   Social Determinants of Health   Financial Resource Strain: Not on file  Food Insecurity: Not on file  Transportation Needs: Not on file  Physical Activity: Not on file  Stress: Not on file  Social Connections: Not on file    Allergies  Allergen Reactions   Gabapentin Anxiety     Current Outpatient Medications:    acetaminophen  (TYLENOL) 325 MG tablet, Take 2 tablets (650 mg total) by mouth every 6 (six) hours as needed for mild pain, headache or fever., Disp: , Rfl:    aspirin 81 MG chewable tablet, Chew 1 tablet (81 mg total) by mouth daily., Disp: , Rfl:    atorvastatin (LIPITOR) 80 MG tablet, Take 40 mg by mouth daily. , Disp: , Rfl:    Carbidopa-Levodopa ER 48.75-195 MG CPCR, Take 1 tablet by mouth 4 (four) times daily., Disp: , Rfl:    cefadroxil (DURICEF) 1 g tablet, Take 1 tablet (1 g total) by mouth 2 (two) times daily., Disp: 60 tablet, Rfl: 11   Cholecalciferol (VITAMIN D3) 125 MCG (5000 UT) TABS, Take 5,000 Units by mouth daily. , Disp: , Rfl:    hydrOXYzine (ATARAX/VISTARIL) 25 MG tablet, Take 1 tablet (25 mg total) by mouth 3 (three) times daily as needed for anxiety., Disp: 30 tablet, Rfl: 0   lisinopril (ZESTRIL) 10 MG tablet, Take 10 mg by mouth daily., Disp: , Rfl:    loratadine (CLARITIN) 10 MG tablet, Take 10 mg by mouth daily., Disp: , Rfl:    LORazepam (ATIVAN) 1 MG tablet, Take 1 tablet (1 mg total) by mouth 2 (two) times daily as needed for up to 4 doses for anxiety., Disp: 4 tablet, Rfl: 0   meloxicam (MOBIC) 15 MG tablet, Take 15 mg by mouth daily., Disp: , Rfl:    metFORMIN (GLUCOPHAGE) 500 MG tablet, Take 500 mg by mouth 2 (two) times daily with a meal. , Disp: , Rfl:    Multiple Vitamin (MULTI-VITAMIN) tablet, Take 1 tablet by mouth daily., Disp: , Rfl:    pantoprazole (PROTONIX) 40 MG tablet, Take 40 mg by mouth daily., Disp: , Rfl:    PARoxetine (PAXIL) 20 MG tablet, Take 1 tablet (20 mg total) by mouth 2 (two) times daily. (Patient taking differently: Take 10 mg by mouth 2 (two) times daily.), Disp: , Rfl:    rOPINIRole (REQUIP) 1 MG tablet, Take 0.5mg  at 6pm and 1mg  at 9pm. (Patient taking differently: Take 0.5 mg by mouth See admin instructions. Take 0.5 mg by mouth every evening and 6 pm and again at 9 pm), Disp: 135 tablet, Rfl: 3   sodium chloride (MURO 128) 5 % ophthalmic solution,  Place 1 drop into the right eye every 3 (three) hours as needed for eye irritation., Disp: , Rfl:    thiamine 100 MG tablet, Take 1 tablet (100 mg total) by mouth daily., Disp: , Rfl:    albuterol (VENTOLIN HFA) 108 (90 Base) MCG/ACT inhaler, Inhale 2 puffs into the lungs every 6 (six) hours as needed for wheezing or shortness of breath. (Patient not taking: Reported on 02/06/2021),  Disp: 8 g, Rfl: 1   bisacodyl (DULCOLAX) 10 MG suppository, Place 1 suppository (10 mg total) rectally daily as needed for moderate constipation. (Patient not taking: Reported on 02/06/2021), Disp: 12 suppository, Rfl: 0   carvedilol (COREG) 25 MG tablet, Take 1 tablet (25 mg total) by mouth 2 (two) times daily with a meal. (Patient not taking: Reported on 01/09/2021), Disp: , Rfl:    diltiazem (CARDIZEM CD) 240 MG 24 hr capsule, Take 1 capsule (240 mg total) by mouth daily. (Patient not taking: Reported on 01/09/2021), Disp: , Rfl:    docusate sodium (COLACE) 100 MG capsule, Take 100 mg by mouth daily. (Patient not taking: Reported on 02/06/2021), Disp: , Rfl:    guaiFENesin-dextromethorphan (ROBITUSSIN DM) 100-10 MG/5ML syrup, Take 10 mLs by mouth every 6 (six) hours as needed for cough. (Patient not taking: Reported on 11/23/2020), Disp: 236 mL, Rfl: 0   oxyCODONE (OXY IR/ROXICODONE) 5 MG immediate release tablet, Take 1 tablet (5 mg total) by mouth every 6 (six) hours as needed for up to 5 doses for severe pain (pain). (Patient not taking: Reported on 01/09/2021), Disp: 5 tablet, Rfl: 0   phenytoin (DILANTIN) 100 MG ER capsule, Take 100 mg by mouth 3 (three) times daily. (Patient not taking: Reported on 02/06/2021), Disp: , Rfl:    Review of Systems  Constitutional:  Negative for chills and fever.  HENT:  Negative for congestion and sore throat.   Eyes:  Negative for photophobia.  Respiratory:  Negative for cough, shortness of breath and wheezing.   Cardiovascular:  Negative for chest pain, palpitations and leg swelling.   Gastrointestinal:  Negative for abdominal pain, blood in stool, constipation, diarrhea, nausea and vomiting.  Genitourinary:  Negative for dysuria, flank pain and hematuria.  Musculoskeletal:  Negative for back pain and myalgias.  Skin:  Negative for rash.  Neurological:  Negative for dizziness, weakness and headaches.  Hematological:  Does not bruise/bleed easily.  Psychiatric/Behavioral:  Negative for suicidal ideas.       Objective:   Physical Exam Constitutional:      Appearance: He is well-developed.  HENT:     Head: Normocephalic and atraumatic.  Eyes:     Conjunctiva/sclera: Conjunctivae normal.  Cardiovascular:     Rate and Rhythm: Normal rate and regular rhythm.  Pulmonary:     Effort: Pulmonary effort is normal. No respiratory distress.     Breath sounds: No wheezing.  Abdominal:     General: There is no distension.     Palpations: Abdomen is soft.  Musculoskeletal:        General: No tenderness. Normal range of motion.     Cervical back: Normal range of motion and neck supple.  Skin:    General: Skin is warm and dry.     Coloration: Skin is not pale.     Findings: No erythema or rash.  Neurological:     General: No focal deficit present.     Mental Status: He is alert and oriented to person, place, and time.  Psychiatric:        Mood and Affect: Mood normal.        Behavior: Behavior normal.        Thought Content: Thought content normal.        Judgment: Judgment normal.    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  Rifampin contraindicated due to drug drug interactions.  I will continue with cefadroxil which she is actually getting in the form of 2 500 mg twice daily via the [facility]  I will recheck a sed rate CRP CMP and CBC with differential.  He is going to see Dr. Saintclair Halsted tomorrow of asked him to  make sure Dr. Saintclair Halsted checks back in with me.  Given the increasing purulence of his wound I would think he might need local debridement at minimum there is also mention of screws potentially loosening and this certainly could also be consistent with deep infection.  Vaccine counseling he has not been vaccinated for COVID-19 which I recommended him to get done.  I also recommended flu shot but he refused that today.  He believes he has had pneumonia shot at the [facility] roughly a year ago though I do not know if this was Prevnar 20 probably not and he probably would benefit from it.  As scheduled with Dr. Gale Journey for follow-up on March 2 we will follow closely

## 2021-04-12 LAB — CBC WITH DIFFERENTIAL/PLATELET
Absolute Monocytes: 608 cells/uL (ref 200–950)
Basophils Absolute: 38 cells/uL (ref 0–200)
Basophils Relative: 0.6 %
Eosinophils Absolute: 499 cells/uL (ref 15–500)
Eosinophils Relative: 7.8 %
HCT: 37.6 % — ABNORMAL LOW (ref 38.5–50.0)
Hemoglobin: 13 g/dL — ABNORMAL LOW (ref 13.2–17.1)
Lymphs Abs: 1626 cells/uL (ref 850–3900)
MCH: 30.6 pg (ref 27.0–33.0)
MCHC: 34.6 g/dL (ref 32.0–36.0)
MCV: 88.5 fL (ref 80.0–100.0)
MPV: 9.3 fL (ref 7.5–12.5)
Monocytes Relative: 9.5 %
Neutro Abs: 3629 cells/uL (ref 1500–7800)
Neutrophils Relative %: 56.7 %
Platelets: 207 10*3/uL (ref 140–400)
RBC: 4.25 10*6/uL (ref 4.20–5.80)
RDW: 13.5 % (ref 11.0–15.0)
Total Lymphocyte: 25.4 %
WBC: 6.4 10*3/uL (ref 3.8–10.8)

## 2021-04-12 LAB — COMPLETE METABOLIC PANEL WITH GFR
AG Ratio: 1.8 (calc) (ref 1.0–2.5)
ALT: 24 U/L (ref 9–46)
AST: 24 U/L (ref 10–35)
Albumin: 4.6 g/dL (ref 3.6–5.1)
Alkaline phosphatase (APISO): 89 U/L (ref 35–144)
BUN: 19 mg/dL (ref 7–25)
CO2: 29 mmol/L (ref 20–32)
Calcium: 9.7 mg/dL (ref 8.6–10.3)
Chloride: 97 mmol/L — ABNORMAL LOW (ref 98–110)
Creat: 0.94 mg/dL (ref 0.70–1.35)
Globulin: 2.5 g/dL (calc) (ref 1.9–3.7)
Glucose, Bld: 107 mg/dL — ABNORMAL HIGH (ref 65–99)
Potassium: 4.7 mmol/L (ref 3.5–5.3)
Sodium: 135 mmol/L (ref 135–146)
Total Bilirubin: 0.3 mg/dL (ref 0.2–1.2)
Total Protein: 7.1 g/dL (ref 6.1–8.1)
eGFR: 90 mL/min/{1.73_m2} (ref >=60)

## 2021-04-12 LAB — C-REACTIVE PROTEIN: CRP: 32.2 mg/L — ABNORMAL HIGH (ref <8.0)

## 2021-04-12 LAB — SEDIMENTATION RATE: Sed Rate: 2 mm/h (ref 0–20)

## 2021-05-09 ENCOUNTER — Ambulatory Visit: Payer: No Typology Code available for payment source | Admitting: Internal Medicine

## 2021-05-21 ENCOUNTER — Other Ambulatory Visit: Payer: Self-pay

## 2021-05-21 ENCOUNTER — Emergency Department (HOSPITAL_COMMUNITY): Payer: No Typology Code available for payment source

## 2021-05-21 ENCOUNTER — Inpatient Hospital Stay (HOSPITAL_COMMUNITY)
Admission: EM | Admit: 2021-05-21 | Discharge: 2021-05-24 | DRG: 496 | Disposition: A | Discharge status: Home / Self Care | Payer: No Typology Code available for payment source | Attending: Internal Medicine | Admitting: Internal Medicine

## 2021-05-21 ENCOUNTER — Encounter (HOSPITAL_COMMUNITY): Payer: Self-pay | Admitting: Emergency Medicine

## 2021-05-21 DIAGNOSIS — A4901 Methicillin susceptible Staphylococcus aureus infection, unspecified site: Secondary | ICD-10-CM | POA: Diagnosis present

## 2021-05-21 DIAGNOSIS — E878 Other disorders of electrolyte and fluid balance, not elsewhere classified: Secondary | ICD-10-CM | POA: Diagnosis present

## 2021-05-21 DIAGNOSIS — E7800 Pure hypercholesterolemia, unspecified: Secondary | ICD-10-CM | POA: Diagnosis present

## 2021-05-21 DIAGNOSIS — Z794 Long term (current) use of insulin: Secondary | ICD-10-CM

## 2021-05-21 DIAGNOSIS — Z79899 Other long term (current) drug therapy: Secondary | ICD-10-CM

## 2021-05-21 DIAGNOSIS — Y828 Other medical devices associated with adverse incidents: Secondary | ICD-10-CM | POA: Diagnosis present

## 2021-05-21 DIAGNOSIS — E1165 Type 2 diabetes mellitus with hyperglycemia: Secondary | ICD-10-CM | POA: Diagnosis present

## 2021-05-21 DIAGNOSIS — Z6832 Body mass index (BMI) 32.0-32.9, adult: Secondary | ICD-10-CM | POA: Diagnosis not present

## 2021-05-21 DIAGNOSIS — M1990 Unspecified osteoarthritis, unspecified site: Secondary | ICD-10-CM | POA: Diagnosis present

## 2021-05-21 DIAGNOSIS — R000 Tachycardia, unspecified: Secondary | ICD-10-CM | POA: Diagnosis present

## 2021-05-21 DIAGNOSIS — E669 Obesity, unspecified: Secondary | ICD-10-CM | POA: Diagnosis present

## 2021-05-21 DIAGNOSIS — Z7984 Long term (current) use of oral hypoglycemic drugs: Secondary | ICD-10-CM | POA: Diagnosis not present

## 2021-05-21 DIAGNOSIS — Z85828 Personal history of other malignant neoplasm of skin: Secondary | ICD-10-CM | POA: Diagnosis not present

## 2021-05-21 DIAGNOSIS — T8463XA Infection and inflammatory reaction due to internal fixation device of spine, initial encounter: Secondary | ICD-10-CM | POA: Diagnosis not present

## 2021-05-21 DIAGNOSIS — Z20822 Contact with and (suspected) exposure to covid-19: Secondary | ICD-10-CM | POA: Diagnosis present

## 2021-05-21 DIAGNOSIS — T8463XS Infection and inflammatory reaction due to internal fixation device of spine, sequela: Secondary | ICD-10-CM | POA: Diagnosis not present

## 2021-05-21 DIAGNOSIS — B9561 Methicillin susceptible Staphylococcus aureus infection as the cause of diseases classified elsewhere: Secondary | ICD-10-CM | POA: Diagnosis not present

## 2021-05-21 DIAGNOSIS — F419 Anxiety disorder, unspecified: Secondary | ICD-10-CM | POA: Diagnosis present

## 2021-05-21 DIAGNOSIS — K219 Gastro-esophageal reflux disease without esophagitis: Secondary | ICD-10-CM | POA: Diagnosis present

## 2021-05-21 DIAGNOSIS — T84038A Mechanical loosening of other internal prosthetic joint, initial encounter: Principal | ICD-10-CM | POA: Diagnosis present

## 2021-05-21 DIAGNOSIS — T8141XA Infection following a procedure, superficial incisional surgical site, initial encounter: Secondary | ICD-10-CM | POA: Diagnosis present

## 2021-05-21 DIAGNOSIS — L089 Local infection of the skin and subcutaneous tissue, unspecified: Principal | ICD-10-CM

## 2021-05-21 DIAGNOSIS — Z888 Allergy status to other drugs, medicaments and biological substances status: Secondary | ICD-10-CM | POA: Diagnosis not present

## 2021-05-21 DIAGNOSIS — Z791 Long term (current) use of non-steroidal anti-inflammatories (NSAID): Secondary | ICD-10-CM

## 2021-05-21 DIAGNOSIS — T8149XA Infection following a procedure, other surgical site, initial encounter: Secondary | ICD-10-CM | POA: Diagnosis present

## 2021-05-21 DIAGNOSIS — G7112 Myotonia congenita: Secondary | ICD-10-CM | POA: Diagnosis present

## 2021-05-21 DIAGNOSIS — I10 Essential (primary) hypertension: Secondary | ICD-10-CM | POA: Diagnosis present

## 2021-05-21 DIAGNOSIS — Z87891 Personal history of nicotine dependence: Secondary | ICD-10-CM | POA: Diagnosis not present

## 2021-05-21 DIAGNOSIS — Z5189 Encounter for other specified aftercare: Secondary | ICD-10-CM

## 2021-05-21 DIAGNOSIS — E785 Hyperlipidemia, unspecified: Secondary | ICD-10-CM | POA: Diagnosis present

## 2021-05-21 DIAGNOSIS — R7881 Bacteremia: Secondary | ICD-10-CM

## 2021-05-21 DIAGNOSIS — T84296A Other mechanical complication of internal fixation device of vertebrae, initial encounter: Secondary | ICD-10-CM | POA: Diagnosis not present

## 2021-05-21 HISTORY — DX: Infection and inflammatory reaction due to internal fixation device of spine, sequela: T84.63XS

## 2021-05-21 LAB — CBC WITH DIFFERENTIAL/PLATELET
Abs Immature Granulocytes: 0.02 10*3/uL (ref 0.00–0.07)
Basophils Absolute: 0.1 10*3/uL (ref 0.0–0.1)
Basophils Relative: 1 %
Eosinophils Absolute: 0.4 10*3/uL (ref 0.0–0.5)
Eosinophils Relative: 7 %
HCT: 38.9 % — ABNORMAL LOW (ref 39.0–52.0)
Hemoglobin: 13.5 g/dL (ref 13.0–17.0)
Immature Granulocytes: 0 %
Lymphocytes Relative: 29 %
Lymphs Abs: 1.7 10*3/uL (ref 0.7–4.0)
MCH: 31.3 pg (ref 26.0–34.0)
MCHC: 34.7 g/dL (ref 30.0–36.0)
MCV: 90.3 fL (ref 80.0–100.0)
Monocytes Absolute: 0.6 10*3/uL (ref 0.1–1.0)
Monocytes Relative: 10 %
Neutro Abs: 3.1 10*3/uL (ref 1.7–7.7)
Neutrophils Relative %: 53 %
Platelets: 185 10*3/uL (ref 150–400)
RBC: 4.31 MIL/uL (ref 4.22–5.81)
RDW: 13.6 % (ref 11.5–15.5)
WBC: 5.9 10*3/uL (ref 4.0–10.5)
nRBC: 0 % (ref 0.0–0.2)

## 2021-05-21 LAB — RESP PANEL BY RT-PCR (FLU A&B, COVID) ARPGX2
Influenza A by PCR: NEGATIVE
Influenza B by PCR: NEGATIVE
SARS Coronavirus 2 by RT PCR: NEGATIVE

## 2021-05-21 LAB — COMPREHENSIVE METABOLIC PANEL
ALT: 11 U/L (ref 0–44)
AST: 27 U/L (ref 15–41)
Albumin: 4.3 g/dL (ref 3.5–5.0)
Alkaline Phosphatase: 80 U/L (ref 38–126)
Anion gap: 13 (ref 5–15)
BUN: 14 mg/dL (ref 8–23)
CO2: 25 mmol/L (ref 22–32)
Calcium: 9.2 mg/dL (ref 8.9–10.3)
Chloride: 94 mmol/L — ABNORMAL LOW (ref 98–111)
Creatinine, Ser: 0.76 mg/dL (ref 0.61–1.24)
GFR, Estimated: >60 mL/min (ref >60)
Glucose, Bld: 119 mg/dL — ABNORMAL HIGH (ref 70–99)
Potassium: 4 mmol/L (ref 3.5–5.1)
Sodium: 132 mmol/L — ABNORMAL LOW (ref 135–145)
Total Bilirubin: 0.8 mg/dL (ref 0.3–1.2)
Total Protein: 7.2 g/dL (ref 6.5–8.1)

## 2021-05-21 LAB — URINALYSIS, ROUTINE W REFLEX MICROSCOPIC
Bilirubin Urine: NEGATIVE
Glucose, UA: NEGATIVE mg/dL
Hgb urine dipstick: NEGATIVE
Ketones, ur: 20 mg/dL — AB
Leukocytes,Ua: NEGATIVE
Nitrite: NEGATIVE
Protein, ur: NEGATIVE mg/dL
Specific Gravity, Urine: 1.013 (ref 1.005–1.030)
pH: 6 (ref 5.0–8.0)

## 2021-05-21 LAB — LACTIC ACID, PLASMA
Lactic Acid, Venous: 1.5 mmol/L (ref 0.5–1.9)
Lactic Acid, Venous: 1.4 mmol/L (ref 0.5–1.9)

## 2021-05-21 LAB — APTT: aPTT: 29 seconds (ref 24–36)

## 2021-05-21 LAB — PROTIME-INR
INR: 1 (ref 0.8–1.2)
Prothrombin Time: 12.9 seconds (ref 11.4–15.2)

## 2021-05-21 IMAGING — DX DG CHEST 1V PORT
1 series · 1 of 1 positions shown · non-contrast
Comparison: [DATE]

CLINICAL DATA: Concern for sepsis

EXAM:
PORTABLE CHEST 1 VIEW

[chest ap]
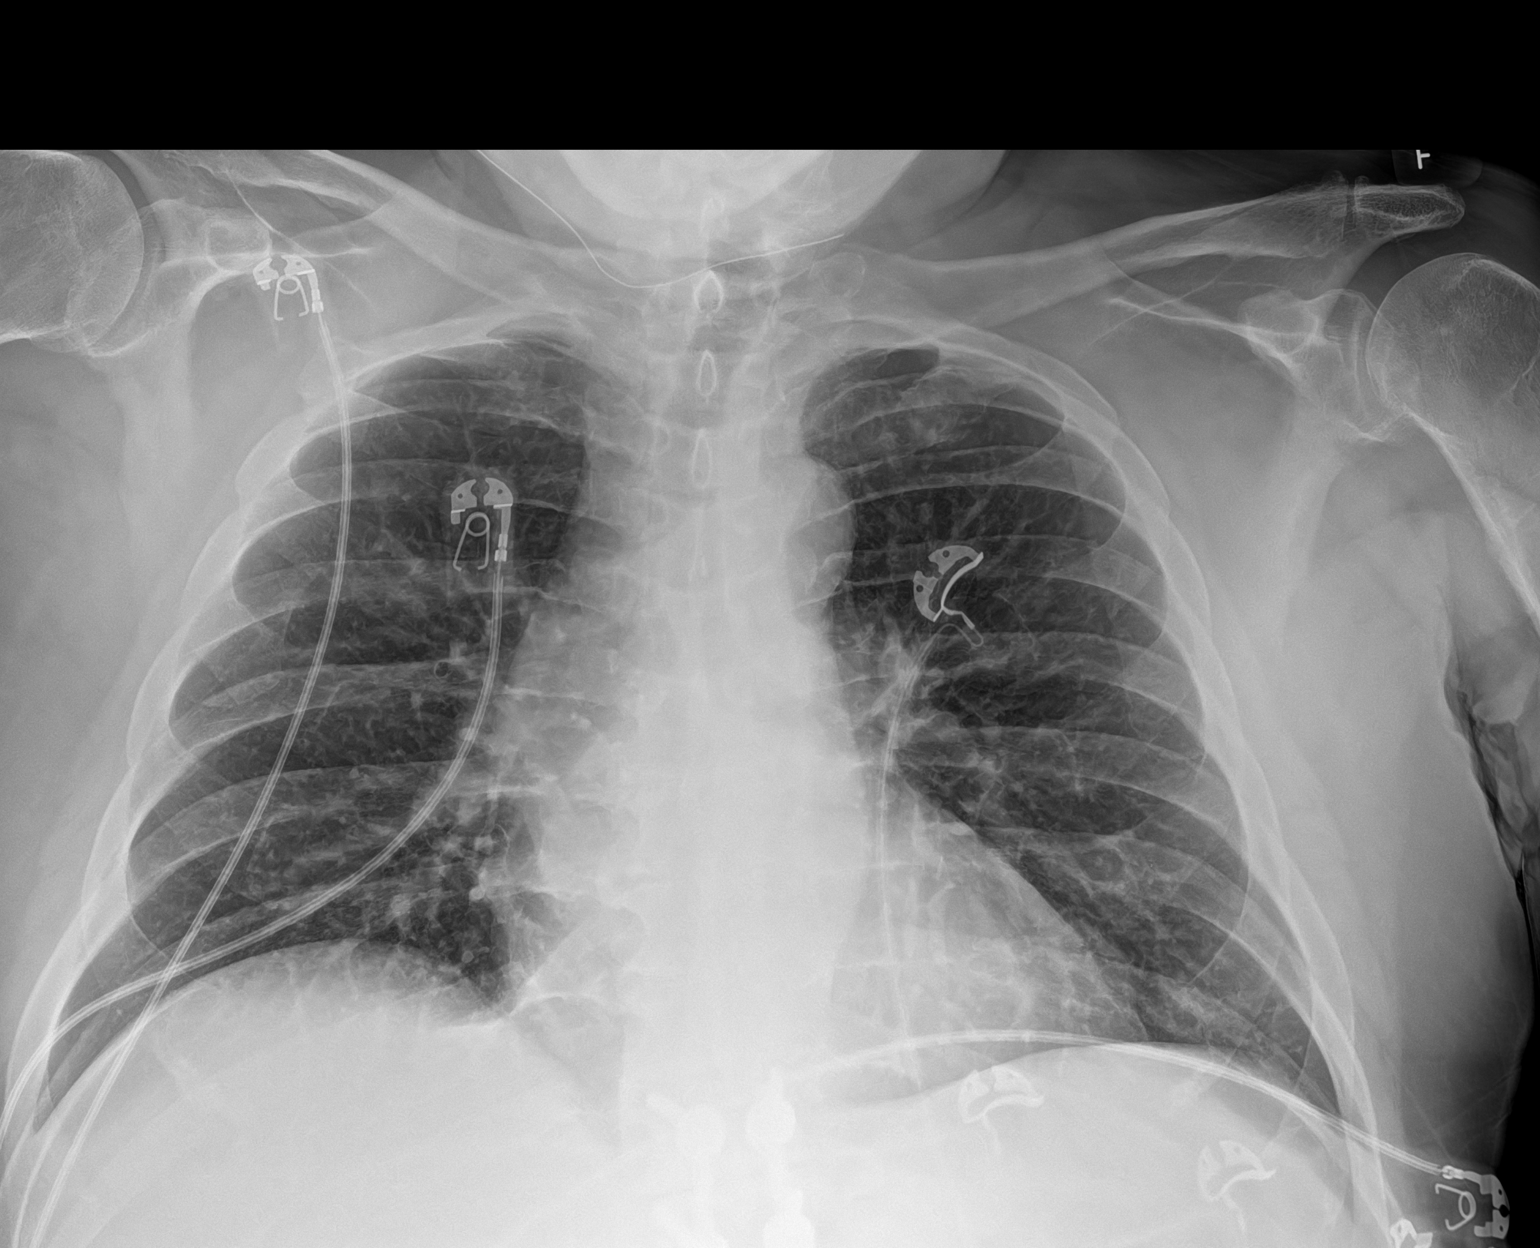

[1 of 1 positions shown; findings below may reference images not displayed]

FINDINGS: The heart size and mediastinal contours are within normal limits.
Both lungs are clear. The visualized skeletal structures are
unremarkable.
IMPRESSION: No acute abnormality of the lungs in [facility] portable projection.

## 2021-05-21 MED ORDER — PANTOPRAZOLE SODIUM 40 MG PO TBEC
40.0000 mg | DELAYED_RELEASE_TABLET | Freq: Every day | ORAL | Status: DC
  Administered 2021-05-21 – 2021-05-24 (×4): 40 mg via ORAL
  Filled 2021-05-21 (×4): qty 1

## 2021-05-21 MED ORDER — CARBIDOPA-LEVODOPA ER 48.75-195 MG PO CPCR
2.0000 | ORAL_CAPSULE | Freq: Two times a day (BID) | ORAL | Status: DC
  Administered 2021-05-23 – 2021-05-24 (×3): 2 via ORAL
  Filled 2021-05-21: qty 1

## 2021-05-21 MED ORDER — OXYCODONE HCL 5 MG PO TABS
5.0000 mg | ORAL_TABLET | ORAL | Status: DC | PRN
  Administered 2021-05-21 – 2021-05-24 (×10): 5 mg via ORAL
  Filled 2021-05-21 (×11): qty 1

## 2021-05-21 MED ORDER — PAROXETINE HCL 10 MG PO TABS
10.0000 mg | ORAL_TABLET | Freq: Two times a day (BID) | ORAL | Status: DC
  Administered 2021-05-21 – 2021-05-24 (×6): 10 mg via ORAL
  Filled 2021-05-21 (×7): qty 1

## 2021-05-21 MED ORDER — VANCOMYCIN HCL 1750 MG/350ML IV SOLN
1750.0000 mg | Freq: Once | INTRAVENOUS | Status: AC
  Administered 2021-05-21: 1750 mg via INTRAVENOUS
  Filled 2021-05-21: qty 350

## 2021-05-21 MED ORDER — VANCOMYCIN HCL IN DEXTROSE 1-5 GM/200ML-% IV SOLN
1000.0000 mg | Freq: Once | INTRAVENOUS | Status: DC
  Filled 2021-05-21: qty 200

## 2021-05-21 MED ORDER — LORAZEPAM 1 MG PO TABS
1.0000 mg | ORAL_TABLET | Freq: Three times a day (TID) | ORAL | Status: DC
  Administered 2021-05-21 – 2021-05-24 (×7): 1 mg via ORAL
  Filled 2021-05-21 (×7): qty 1

## 2021-05-21 MED ORDER — VANCOMYCIN HCL 750 MG/150ML IV SOLN
750.0000 mg | Freq: Two times a day (BID) | INTRAVENOUS | Status: DC
  Administered 2021-05-22 – 2021-05-23 (×3): 750 mg via INTRAVENOUS
  Filled 2021-05-21 (×4): qty 150

## 2021-05-21 MED ORDER — LACTATED RINGERS IV BOLUS (SEPSIS)
1000.0000 mL | Freq: Once | INTRAVENOUS | Status: AC
Start: 2021-05-21 — End: 2021-05-21
  Administered 2021-05-21: 1000 mL via INTRAVENOUS

## 2021-05-21 MED ORDER — ROPINIROLE HCL 0.25 MG PO TABS
0.5000 mg | ORAL_TABLET | ORAL | Status: DC
  Administered 2021-05-21: 1 mg via ORAL
  Filled 2021-05-21: qty 2

## 2021-05-21 MED ORDER — ROPINIROLE HCL 1 MG PO TABS
1.0000 mg | ORAL_TABLET | Freq: Every day | ORAL | Status: DC
  Administered 2021-05-22 – 2021-05-23 (×2): 1 mg via ORAL
  Filled 2021-05-21 (×2): qty 1

## 2021-05-21 MED ORDER — LACTATED RINGERS IV BOLUS (SEPSIS)
1000.0000 mL | Freq: Once | INTRAVENOUS | Status: AC
  Administered 2021-05-21: 1000 mL via INTRAVENOUS

## 2021-05-21 MED ORDER — ROPINIROLE HCL 1 MG PO TABS
0.5000 mg | ORAL_TABLET | ORAL | Status: DC
  Administered 2021-05-23: 0.5 mg via ORAL
  Filled 2021-05-21: qty 1

## 2021-05-21 MED ORDER — SODIUM CHLORIDE 0.9 % IV SOLN
2.0000 g | Freq: Once | INTRAVENOUS | Status: AC
  Administered 2021-05-21: 2 g via INTRAVENOUS
  Filled 2021-05-21: qty 20

## 2021-05-21 MED ORDER — LACTATED RINGERS IV SOLN: INTRAVENOUS | Status: AC

## 2021-05-21 NOTE — ED Notes (Signed)
Pt's wound care nurse called and reports pt has had fever along with nausea and diarrhea since Saturday. She also reports the wound is red, warm and has fluid pockets around it. He also has hx of osteomyelitis. Surgery was done at [facility].  ?

## 2021-05-21 NOTE — ED Provider Notes (Signed)
?[facility] ?Provider Note ? ? ?CSN: [id number] ?Arrival date & time: 05/21/21  1222 ? ?  ? ?History ? ?Chief Complaint  ?Patient presents with  ? Wound Check  ? ? ?Danny Hampton is a 66 y.o. adult with a history hardware complicating wound infection presenting today with possible wound infection.  Spinal surgery preformed 10/31/2020.  Has been treated several times for complicated infections following surgery, including MSSA sepsis.  Three days ago, patient states the rash began to look and feel infected with fever, redness, tenderness, and malodorous purulent discharge.  Endorses nausea, shortness of breath, and lightheadedness.  Is not on O2 at home.  Takes Cephalexin '1000mg'$  BID.  Denies abdominal pain, urinary symptoms, changes in bowel habits.  Denies stiff neck or vision changes.  Denies leg swelling/warmth/pain.  Neurosurgeon is Dr. Saintclair Halsted and Infectious Disease specialist is Dr. [name].  ? ?The history is provided by the patient and medical records.  ?Wound Check ? ? ?  ? ?Home Medications ?Prior to Admission medications   ?Medication Sig Start Date End Date Taking? Authorizing Provider  ?acetaminophen (TYLENOL) 325 MG tablet Take 2 tablets (650 mg total) by mouth every 6 (six) hours as needed for mild pain, headache or fever. 12/06/20  Yes Antonieta Pert, MD  ?aspirin 81 MG chewable tablet Chew 1 tablet (81 mg total) by mouth daily. 04/26/20  Yes Barton Dubois, MD  ?atorvastatin (LIPITOR) 80 MG tablet Take 40 mg by mouth daily.    Yes [provider]  ?Carbidopa-Levodopa ER 48.75-195 MG CPCR Take 2 tablets by mouth 2 (two) times daily.   Yes [provider]  ?cephALEXin (KEFLEX) 500 MG capsule Take 2 capsules by mouth 2 (two) times daily. 04/25/21  Yes [provider]  ?Cholecalciferol (VITAMIN D3) 125 MCG (5000 UT) TABS Take 5,000 Units by mouth daily.    Yes [provider]  ?hydrOXYzine (ATARAX/VISTARIL) 25 MG tablet Take 1 tablet (25 mg total) by mouth 3  (three) times daily as needed for anxiety. ?Patient taking differently: Take 25 mg by mouth 4 (four) times daily as needed for anxiety. 12/06/20  Yes Antonieta Pert, MD  ?lisinopril (ZESTRIL) 10 MG tablet Take 10 mg by mouth daily.   Yes [provider]  ?loratadine (CLARITIN) 10 MG tablet Take 10 mg by mouth daily.   Yes [provider]  ?LORazepam (ATIVAN) 1 MG tablet Take 1 tablet (1 mg total) by mouth 2 (two) times daily as needed for up to 4 doses for anxiety. ?Patient taking differently: Take 1 mg by mouth 3 (three) times daily. 12/06/20  Yes Antonieta Pert, MD  ?meloxicam (MOBIC) 15 MG tablet Take 15 mg by mouth daily.   Yes [provider]  ?metFORMIN (GLUCOPHAGE) 500 MG tablet Take 500 mg by mouth 2 (two) times daily with a meal.    Yes [provider]  ?metoprolol succinate (TOPROL-XL) 25 MG 24 hr tablet Take 25 mg by mouth daily.   Yes [provider]  ?Multiple Vitamin (MULTI-VITAMIN) tablet Take 1 tablet by mouth daily.   Yes [provider]  ?naloxone (NARCAN) nasal spray 4 mg/0.1 mL Place 0.4 mg into the nose once. 03/12/21  Yes [provider]  ?pantoprazole (PROTONIX) 40 MG tablet Take 40 mg by mouth daily.   Yes [provider]  ?PARoxetine (PAXIL) 20 MG tablet Take 1 tablet (20 mg total) by mouth 2 (two) times daily. ?Patient taking differently: Take 10 mg by mouth 2 (two) times daily.  12/06/20  Yes Antonieta Pert, MD  ?phenytoin (DILANTIN) 100 MG ER capsule Take 100 mg by mouth 3 (three) times daily.   Yes [provider]  ?rOPINIRole (REQUIP) 1 MG tablet Take 0.'5mg'$  at 6pm and '1mg'$  at 9pm. ?Patient taking differently: Take 0.5 mg by mouth See admin instructions. Take 0.5 mg by mouth 6pm  and  1 mg at 10 pm 03/21/19  Yes Narda Amber K, DO  ?zinc gluconate 50 MG tablet Take 50 mg by mouth daily.   Yes [provider]  ?albuterol (VENTOLIN HFA) 108 (90 Base) MCG/ACT inhaler Inhale 2 puffs into the lungs every 6 (six) hours as  needed for wheezing or shortness of breath. ?Patient not taking: Reported on 02/06/2021 04/26/20   Barton Dubois, MD  ?bisacodyl (DULCOLAX) 10 MG suppository Place 1 suppository (10 mg total) rectally daily as needed for moderate constipation. ?Patient not taking: Reported on 02/06/2021 12/06/20   Antonieta Pert, MD  ?carvedilol (COREG) 25 MG tablet Take 1 tablet (25 mg total) by mouth 2 (two) times daily with a meal. ?Patient not taking: Reported on 01/09/2021 12/06/20   Antonieta Pert, MD  ?cefadroxil (DURICEF) 500 MG capsule Take 2 capsules (1,000 mg total) by mouth 2 (two) times daily. ?Patient not taking: Reported on 05/21/2021 04/11/21   [name], Lavell Islam, MD  ?diltiazem (CARDIZEM CD) 240 MG 24 hr capsule Take 1 capsule (240 mg total) by mouth daily. ?Patient not taking: Reported on 05/21/2021 12/07/20   Antonieta Pert, MD  ?guaiFENesin-dextromethorphan (ROBITUSSIN DM) 100-10 MG/5ML syrup Take 10 mLs by mouth every 6 (six) hours as needed for cough. ?Patient not taking: Reported on 11/23/2020 04/26/20   Barton Dubois, MD  ?oxyCODONE (OXY IR/ROXICODONE) 5 MG immediate release tablet Take 1 tablet (5 mg total) by mouth every 6 (six) hours as needed for up to 5 doses for severe pain (pain). ?Patient not taking: Reported on 01/09/2021 12/06/20   Antonieta Pert, MD  ?thiamine 100 MG tablet Take 1 tablet (100 mg total) by mouth daily. ?Patient not taking: Reported on 05/21/2021 12/07/20   Antonieta Pert, MD  ?   ? ?Allergies    ?Gabapentin   ? ?Review of Systems   ?Review of Systems  ?Constitutional:  Positive for fever.  ?Gastrointestinal:  Positive for nausea.  ?Skin:  Positive for color change and wound.  ?Neurological:  Positive for light-headedness.  ?Psychiatric/Behavioral:  Negative for confusion.   ? ?Physical Exam ?Updated Vital Signs ?BP 125/79 (BP Location: Right Arm)   Pulse 71   Temp 97.7 ?F (36.5 ?C) (Oral)   Resp 12   Ht '5\' 5"'$  (1.651 m)   Wt 89.7 kg   SpO2 94%   BMI 32.91 kg/m?  ?Physical Exam ?Vitals and nursing note  reviewed.  ?Constitutional:   ?   General: He is not in acute distress. ?   Appearance: Normal appearance. He is well-developed. He is not diaphoretic.  ?HENT:  ?   Head: Normocephalic and atraumatic.  ?   Mouth/Throat:  ?   Mouth: Mucous membranes are dry.  ?   Pharynx: Oropharynx is clear.  ?Eyes:  ?   General: No scleral icterus. ?   Conjunctiva/sclera: Conjunctivae normal.  ?Cardiovascular:  ?   Rate and Rhythm: Regular rhythm. Tachycardia present.  ?   Heart sounds: Normal heart sounds. No murmur heard. ?Pulmonary:  ?   Effort: Pulmonary effort is normal. No respiratory distress.  ?Abdominal:  ?   General: Bowel sounds are normal.  ?  Palpations: Abdomen is soft.  ?   Tenderness: There is no abdominal tenderness.  ?Musculoskeletal:     ?   General: No swelling.  ?   Cervical back: Neck supple. No rigidity.  ?Skin: ?   General: Skin is warm and dry.  ?   Capillary Refill: Capillary refill takes less than 2 seconds.  ?   Findings: Wound present.  ? ?    ?   Comments: Central circle is the open malodorous wound with erythematous borders and active discharge ?Outer circle is surrounding erythema, warmth, with some induration  ?Neurological:  ?   Mental Status: He is alert and oriented to person, place, and time.  ?Psychiatric:     ?   Mood and Affect: Mood normal.  ? ? ?ED Results / Procedures / Treatments   ?Labs ?(all labs ordered are listed, but only abnormal results are displayed) ?Labs Reviewed  ?COMPREHENSIVE METABOLIC PANEL - Abnormal; Notable for the following components:  ?    Result Value  ? Sodium 132 (*)   ? Chloride 94 (*)   ? Glucose, Bld 119 (*)   ? All other components within normal limits  ?CBC WITH DIFFERENTIAL/PLATELET - Abnormal; Notable for the following components:  ? HCT 38.9 (*)   ? All other components within normal limits  ?URINALYSIS, ROUTINE W REFLEX MICROSCOPIC - Abnormal; Notable for the following components:  ? Ketones, ur 20 (*)   ? All other components within normal limits  ?RESP  PANEL BY RT-PCR (FLU A&B, COVID) [id number]  ?CULTURE, BLOOD (ROUTINE X 2)  ?CULTURE, BLOOD (ROUTINE X 2)  ?URINE CULTURE  ?AEROBIC/ANAEROBIC CULTURE W GRAM STAIN (SURGICAL/DEEP WOUND)  ?LACTIC ACID, PLASMA  ?LACTIC AC

## 2021-05-21 NOTE — Progress Notes (Signed)
Pharmacy Antibiotic Note ? ?Danny Hampton is a 66 y.o. adult admitted on 05/21/2021 with cellulitis.  Pharmacy has been consulted for Vancomycin dosing. ? ?Plan: ?Vancomycin '1750mg'$  IV loading dose then 750 mg IV Q 12 hrs. Goal AUC 400-550. ?Expected AUC: 463 ?SCr used: 0.8 (actual is 0.76) ?F/U cxs and clinical progress  ?Monitor V/S, labs and levels as indicated ? ?Height: '5\' 4"'$  (162.6 cm) ?Weight: 90.3 kg (199 lb) ?IBW/kg (Calculated) : 59.2 ? ?Temp (24hrs), Avg:97.9 ?F (36.6 ?C), Min:97.9 ?F (36.6 ?C), Max:97.9 ?F (36.6 ?C) ? ?Recent Labs  ?Lab 05/21/21 ?1440  ?WBC 5.9  ?CREATININE 0.76  ?LATICACIDVEN 1.5  ?  ?Estimated Creatinine Clearance (by C-G formula based on SCr of 0.76 mg/dL) ?Male: 76.3 mL/min ?Male: 93.2 mL/min   ? ?Allergies  ?Allergen Reactions  ? Gabapentin Anxiety  ? ? ?Antimicrobials this admission: ?Vancomycin  3/14 >>  ?Ceftriaxone  3/14  ? ?Microbiology results: ?3/14 BCx: pending ? ?Thank you for allowing pharmacy to be a part of this patient?s care. ? ?Isac Sarna, BS Pharm D, BCPS ?Clinical Pharmacist ?Pager [phone] ?05/21/2021 4:17 PM ? ?

## 2021-05-21 NOTE — ED Triage Notes (Signed)
Pt reports wound nurse came today to check a wound to T12 spine area, reported to family it feels as if there is fluid underneath the wound and advised pt to come to ER, surgery was originally 10/31/20; wife reports she does dressing changes daily and as of yesterday the drainage is yellow and has foul odor  ?

## 2021-05-21 NOTE — H&P (Signed)
?History and Physical  ? ? ?Danny Hampton NKN:[id number] DOB: 12-10-1955 DOA: 05/21/2021 ? ?PCP: System, Provider Not In  ?Patient coming from: Home ? ?I have personally briefly reviewed patient's old medical records in [place] ? ?Chief Complaint: Not feeling well ? ?HPI: Danny Hampton is a 66 y.o. adult with medical history significant of hypertension, hyperlipidemia, hyperglycemia, myotonia [place] ?Who has had multiple surgeries on his spine including most recently in August 2022.  This surgery which was a fixation of F79-K2 was complicated by postoperative wound infection requiring debridement, sepsis, MSSA bacteremia who continues on antibiotics and has been on them since he got out of the hospital in September.  He takes Keflex 2000 mg twice daily.  He has had home health coming out and visiting him and doing wound changes with a chronic nonhealing wound.  He has been followed by infectious disease who saw him last in February of this year right after a CT scan that showed a lucency along both T10 transpedicular screws which may indicate loosening.  Since that time his purulent drainage with odor has improved and he was doing well until 2 days ago.  At that time he developed malaise and just not feeling well.  He has been in the bed for approximately 2 days and this morning his home health nurse came out to see him and checked his wound and felt like there was increasing erythema and induration around the wound which had not been there previously.  He called his neurosurgeon who told to come directly to the hospital.  ? ?ED Course:  In the ED he was initially found to be tachycardic but afebrile.  He was given IV fluids and IV antibiotics, Rocephin and vancomycin.[name]  He had an adequate response to IV fluid hydration his pulse rate was down to 64.  He had a white count of 5.9 and a lactic acid of 1.5.  Hospitalist were asked to admit at [facility]. ? ?Review of Systems: As per HPI otherwise 10 point  review of systems negative.  ? ?Past Medical History:  ?Diagnosis Date  ? Acid reflux   ? Anxiety   ? Arrhythmia   ? paroxysmal supreventricular tachycardia  ? Arthritis   ? Complication of anesthesia   ? Myotonia congenita  ? Dysrhythmia   ? Essential hypertension   ? Family history of adverse reaction to anesthesia   ? Sister  ? Hardware complicating wound infection ([place]) 04/11/2021  ? High cholesterol   ? History of skin cancer   ? Myotonia congenita   ? Type 2 diabetes mellitus ([place])   ? ? ?Past Surgical History:  ?Procedure Laterality Date  ? APPLICATION OF WOUND VAC  11/27/2020  ? Procedure: APPLICATION OF WOUND VAC;  Surgeon: Kristeen Miss, MD;  Location: [place];  Service: Neurosurgery;;  ? BACK SURGERY    ? times 2  ? CARPAL TUNNEL RELEASE    ? CATARACT EXTRACTION W/PHACO Left 01/31/2019  ? Procedure: CATARACT EXTRACTION PHACO AND INTRAOCULAR LENS PLACEMENT (IOC);  Surgeon: Baruch Goldmann, MD;  Location: [facility];  Service: Ophthalmology;  Laterality: Left;  CDE: 2.66  ? LUMBAR WOUND DEBRIDEMENT N/A 11/27/2020  ? Procedure: LUMBAR WOUND DEBRIDEMENT AND WASHOUT;  Surgeon: Kristeen Miss, MD;  Location: [place];  Service: Neurosurgery;  Laterality: N/A;  ? NOSE SURGERY    ? TEE WITHOUT CARDIOVERSION N/A 12/03/2020  ? Procedure: TRANSESOPHAGEAL ECHOCARDIOGRAM (TEE);  Surgeon: Jerline Pain, MD;  Location: [facility];  Service: Cardiovascular;  Laterality: N/A;  ? TOTAL KNEE ARTHROPLASTY Right 10/12/2018  ? Procedure: RIGHT TOTAL KNEE ARTHROPLASTY;  Surgeon: Garald Balding, MD;  Location: [facility];  Service: Orthopedics;  Laterality: Right;  ? ? ? reports that he quit smoking about 3 years ago. His smoking use included cigarettes. He has a 30.00 pack-year smoking history. He has never used smokeless tobacco. He reports that he does not currently use alcohol. He reports that he does not use drugs. ? ?Allergies  ?Allergen Reactions  ? Gabapentin Anxiety  ? ? ?Family History  ?Problem Relation Age of Onset  ? Healthy  Mother   ? Healthy Father   ? Cancer Father   ? ? ? ?Prior to Admission medications   ?Medication Sig Start Date End Date Taking? Authorizing Provider  ?cephALEXin (KEFLEX) 500 MG capsule Take 2 capsules by mouth 2 (two) times daily. 04/25/21  Yes [provider]  ?clotrimazole (LOTRIMIN) 1 % cream APPLY A SMALL AMOUNT TOPICALLY TWICE A DAY INTERTRIGO 04/25/21  Yes [provider]  ?naloxone (NARCAN) nasal spray 4 mg/0.1 mL USE 1 SPRAY ONE NOSTRIL ONE TIME AS NEEDED FOR EMERGENCY USE ONLY. MAY REPEAT DOSE IN OTHER NOSTRIL ONCE IN 2 TO 3 MINUTES OR IF STILL NOT BREATHING. 03/12/21  Yes [provider]  ?PARoxetine (PAXIL) 40 MG tablet TAKE ONE-HALF TABLET BY MOUTH TWICE A DAY FOR ANXIETY, DEPRESSION 01/16/21  Yes [provider]  ?rOPINIRole (REQUIP) 1 MG tablet TAKE ONE-HALF TABLET BY MOUTH EVERY EVENING AT 6PM AND TAKE ONE TABLET EVERY EVENING AT 9PM 01/05/21  Yes [provider]  ?acetaminophen (TYLENOL) 325 MG tablet Take 2 tablets (650 mg total) by mouth every 6 (six) hours as needed for mild pain, headache or fever. 12/06/20   Antonieta Pert, MD  ?albuterol (VENTOLIN HFA) 108 (90 Base) MCG/ACT inhaler Inhale 2 puffs into the lungs every 6 (six) hours as needed for wheezing or shortness of breath. ?Patient not taking: Reported on 02/06/2021 04/26/20   Barton Dubois, MD  ?aspirin 81 MG chewable tablet Chew 1 tablet (81 mg total) by mouth daily. 04/26/20   Barton Dubois, MD  ?atorvastatin (LIPITOR) 80 MG tablet Take 40 mg by mouth daily.     [provider]  ?bisacodyl (DULCOLAX) 10 MG suppository Place 1 suppository (10 mg total) rectally daily as needed for moderate constipation. ?Patient not taking: Reported on 02/06/2021 12/06/20   Antonieta Pert, MD  ?Carbidopa-Levodopa ER 48.75-195 MG CPCR Take 1 tablet by mouth 4 (four) times daily.    [provider]  ?carvedilol (COREG) 25 MG tablet Take 1 tablet (25 mg total) by mouth 2 (two) times daily with a  meal. ?Patient not taking: Reported on 01/09/2021 12/06/20   Antonieta Pert, MD  ?cefadroxil (DURICEF) 500 MG capsule Take 2 capsules (1,000 mg total) by mouth 2 (two) times daily. 04/11/21   Truman Hayward, MD  ?Cholecalciferol (VITAMIN D3) 125 MCG (5000 UT) TABS Take 5,000 Units by mouth daily.     [provider]  ?diltiazem (CARDIZEM CD) 240 MG 24 hr capsule Take 1 capsule (240 mg total) by mouth daily. ?Patient not taking: Reported on 01/09/2021 12/07/20   Antonieta Pert, MD  ?docusate sodium (COLACE) 100 MG capsule Take 100 mg by mouth daily. ?Patient not taking: Reported on 02/06/2021    [provider]  ?guaiFENesin-dextromethorphan (ROBITUSSIN DM) 100-10 MG/5ML syrup Take 10 mLs by mouth every 6 (six) hours as needed for cough. ?Patient not taking: Reported on 11/23/2020 04/26/20  Barton Dubois, MD  ?hydrOXYzine (ATARAX/VISTARIL) 25 MG tablet Take 1 tablet (25 mg total) by mouth 3 (three) times daily as needed for anxiety. 12/06/20   Antonieta Pert, MD  ?lisinopril (ZESTRIL) 10 MG tablet Take 10 mg by mouth daily.    [provider]  ?loratadine (CLARITIN) 10 MG tablet Take 10 mg by mouth daily.    [provider]  ?LORazepam (ATIVAN) 1 MG tablet Take 1 tablet (1 mg total) by mouth 2 (two) times daily as needed for up to 4 doses for anxiety. 12/06/20   Antonieta Pert, MD  ?meloxicam (MOBIC) 15 MG tablet Take 15 mg by mouth daily.    [provider]  ?metFORMIN (GLUCOPHAGE) 500 MG tablet Take 500 mg by mouth 2 (two) times daily with a meal.     [provider]  ?Multiple Vitamin (MULTI-VITAMIN) tablet Take 1 tablet by mouth daily.    [provider]  ?oxyCODONE (OXY IR/ROXICODONE) 5 MG immediate release tablet Take 1 tablet (5 mg total) by mouth every 6 (six) hours as needed for up to 5 doses for severe pain (pain). ?Patient not taking: Reported on 01/09/2021 12/06/20   Antonieta Pert, MD  ?pantoprazole (PROTONIX) 40 MG tablet Take 40 mg by mouth daily.    [provider]  ?PARoxetine (PAXIL) 20 MG tablet Take 1 tablet (20 mg total) by mouth 2 (two) times daily. ?Patient taking differently: Take 10 mg by mouth 2 (two) times daily. 12/06/20   Antonieta Pert, MD  ?phenytoin (DILANTIN)

## 2021-05-21 NOTE — ED Notes (Signed)
Gave pt updated information with room number.  ?

## 2021-05-21 NOTE — ED Provider Notes (Signed)
?  Face-to-face evaluation ? ? ?History: Patient presents for evaluation of concerning wound, appearing to be infected when home health nurse saw him today.  He is taking Keflex chronically after sepsis from wound infection associated with a surgical procedure.  He apparently had a tunneling aspect of the wound, which is above his surgical site.  Initial surgery causing the infection, was in August 2022.  He was admitted for sepsis in September 2022. ? ?Physical exam: Alert, calm, cooperative.  No dysarthria or aphasia.  No respiratory distress.  He is able to move without significant back pain.  On the back, in the lower thoracic area there is a linear vertical open wound, gaping slightly.  Surrounding this is indurated tissue which is slightly erythematous.  Wound appears somewhat moist but there is no active drainage.  A dressing over the wound does not have significant amount of accumulated drainage.  This area is nontender to palpation. ? ?MDM: Evaluation for  ?Chief Complaint  ?Patient presents with  ? Wound Check  ?  ? ?Patient with signs and symptoms of recurrent wound infection, currently externally.  Doubt spinal infection at this time.  Patient treated as sepsis patient empirically started on antibiotics and IV fluids.  Initial work up is reassuring with normal white count and metabolic panel showing only mild hyponatremia and hypochloremia.  Lactate was normal at 1.4.  Patient needs observation and consultation with neurosurgery and infectious disease.  He may need imaging at some point.  This could include CT imaging of the thoracic area of the back or MRI which is more sensitive.  He will need hospitalization and transfer to [facility] for that. ? ?Medical screening examination/treatment/procedure(s) were conducted as a shared visit with non-physician practitioner(s) and myself.  I personally evaluated the patient during the encounter  ?  ?Daleen Bo, MD ?05/23/21 1748 ? ?

## 2021-05-21 NOTE — ED Notes (Signed)
[facility] arrived  

## 2021-05-22 ENCOUNTER — Encounter (HOSPITAL_COMMUNITY)
Admission: EM | Disposition: A | Discharge status: Home / Self Care | Payer: Self-pay | Source: Home / Self Care | Attending: Internal Medicine

## 2021-05-22 ENCOUNTER — Ambulatory Visit: Payer: No Typology Code available for payment source | Admitting: Internal Medicine

## 2021-05-22 ENCOUNTER — Inpatient Hospital Stay (HOSPITAL_COMMUNITY): Payer: No Typology Code available for payment source | Admitting: Anesthesiology

## 2021-05-22 ENCOUNTER — Encounter (HOSPITAL_COMMUNITY): Payer: Self-pay | Admitting: General Practice

## 2021-05-22 ENCOUNTER — Other Ambulatory Visit: Payer: Self-pay

## 2021-05-22 ENCOUNTER — Inpatient Hospital Stay (HOSPITAL_COMMUNITY): Payer: No Typology Code available for payment source

## 2021-05-22 DIAGNOSIS — I10 Essential (primary) hypertension: Secondary | ICD-10-CM

## 2021-05-22 DIAGNOSIS — T8149XA Infection following a procedure, other surgical site, initial encounter: Secondary | ICD-10-CM | POA: Diagnosis present

## 2021-05-22 DIAGNOSIS — T84296A Other mechanical complication of internal fixation device of vertebrae, initial encounter: Secondary | ICD-10-CM

## 2021-05-22 DIAGNOSIS — T8463XA Infection and inflammatory reaction due to internal fixation device of spine, initial encounter: Secondary | ICD-10-CM

## 2021-05-22 DIAGNOSIS — F419 Anxiety disorder, unspecified: Secondary | ICD-10-CM

## 2021-05-22 HISTORY — PX: LUMBAR WOUND DEBRIDEMENT: SHX1988

## 2021-05-22 LAB — GLUCOSE, CAPILLARY
Glucose-Capillary: 143 mg/dL — ABNORMAL HIGH (ref 70–99)
Glucose-Capillary: 87 mg/dL (ref 70–99)
Glucose-Capillary: 95 mg/dL (ref 70–99)
Glucose-Capillary: 113 mg/dL — ABNORMAL HIGH (ref 70–99)
Glucose-Capillary: 127 mg/dL — ABNORMAL HIGH (ref 70–99)

## 2021-05-22 LAB — C-REACTIVE PROTEIN: CRP: 0.8 mg/dL (ref <1.0)

## 2021-05-22 LAB — BASIC METABOLIC PANEL
Anion gap: 10 (ref 5–15)
BUN: 8 mg/dL (ref 8–23)
CO2: 25 mmol/L (ref 22–32)
Calcium: 8.7 mg/dL — ABNORMAL LOW (ref 8.9–10.3)
Chloride: 101 mmol/L (ref 98–111)
Creatinine, Ser: 0.71 mg/dL (ref 0.61–1.24)
GFR, Estimated: >60 mL/min (ref >60)
Glucose, Bld: 98 mg/dL (ref 70–99)
Potassium: 3.7 mmol/L (ref 3.5–5.1)
Sodium: 136 mmol/L (ref 135–145)

## 2021-05-22 LAB — CBC
HCT: 31.1 % — ABNORMAL LOW (ref 39.0–52.0)
Hemoglobin: 11.2 g/dL — ABNORMAL LOW (ref 13.0–17.0)
MCH: 31.7 pg (ref 26.0–34.0)
MCHC: 36 g/dL (ref 30.0–36.0)
MCV: 88.1 fL (ref 80.0–100.0)
Platelets: 144 10*3/uL — ABNORMAL LOW (ref 150–400)
RBC: 3.53 MIL/uL — ABNORMAL LOW (ref 4.22–5.81)
RDW: 13.5 % (ref 11.5–15.5)
WBC: 5.1 10*3/uL (ref 4.0–10.5)
nRBC: 0 % (ref 0.0–0.2)

## 2021-05-22 LAB — HIV ANTIBODY (ROUTINE TESTING W REFLEX): HIV Screen 4th Generation wRfx: NONREACTIVE

## 2021-05-22 LAB — SEDIMENTATION RATE: Sed Rate: 3 mm/hr (ref 0–16)

## 2021-05-22 IMAGING — CT CT T SPINE W/O CM
4 of 5 series · 11 of 33 positions shown, 12 images · non-contrast
Comparison: [DATE].

CLINICAL DATA: 65-year-old male with persistent thoracolumbar back
pain. Prior surgery with evidence of lower thoracic hardware
loosening by CT in [REDACTED].

EXAM:
CT THORACIC SPINE WITHOUT CONTRAST
TECHNIQUE: Multidetector CT images of the thoracic were obtained using the
standard protocol without intravenous contrast.
RADIATION DOSE REDUCTION: This exam was performed according to the
departmental dose-optimization program which includes automated
exposure control, adjustment of the mA and/or kV according to
patient size and/or use of iterative reconstruction technique.

[Series 4: t-spine 2.0 st · axial · 0.30mm/px · z∈[+1030,+1218]mm · 3 of 190 slices shown, 4 images]
[im 48/190  soft-tissue]
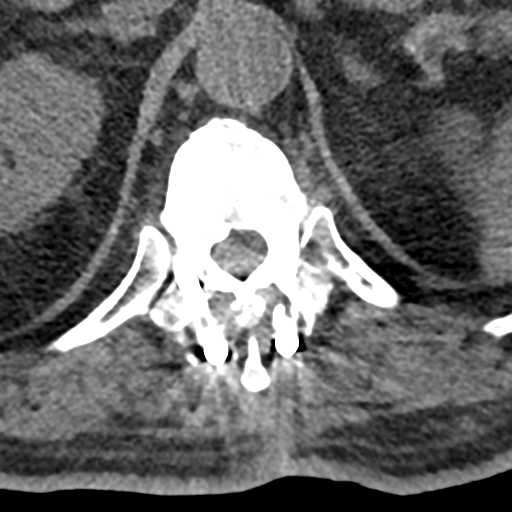
[im 48/190  bone]
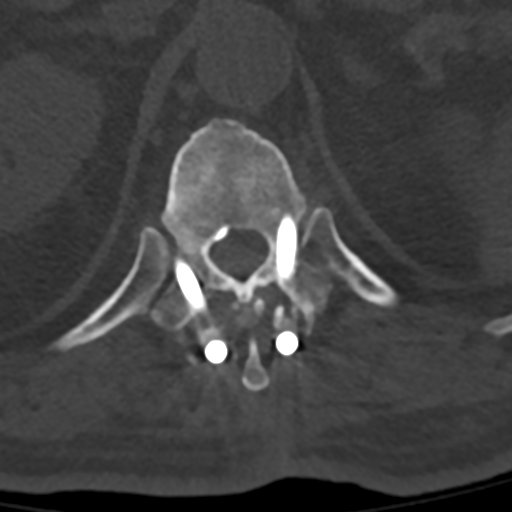
[im 95/190  bone]
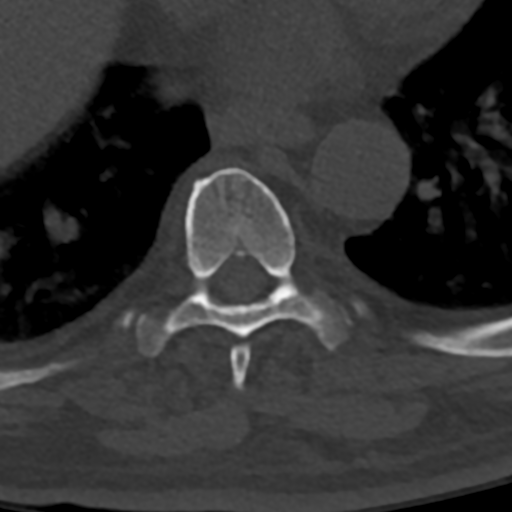
[im 142/190  bone]
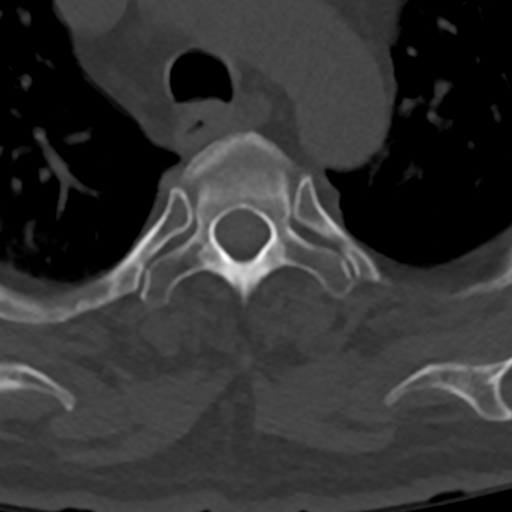

[Series 5: t-spine 2.0 ax bone · axial · 0.30mm/px · z∈[+1052,+1166]mm · 2 of 173 slices shown]
[im 58/173  bone]
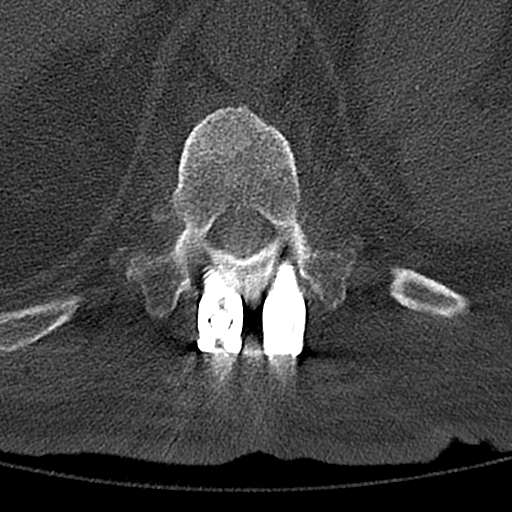
[im 115/173  bone]
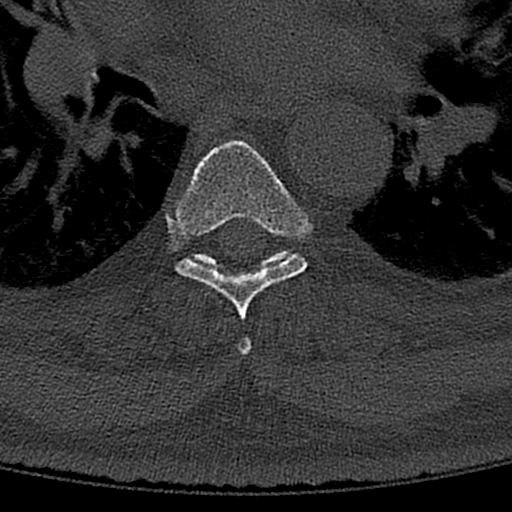

[Series 6: t-spine 2.0 cor bone · coronal · 0.30mm/px · 3 of 74 slices shown]
[im 15/74  bone]
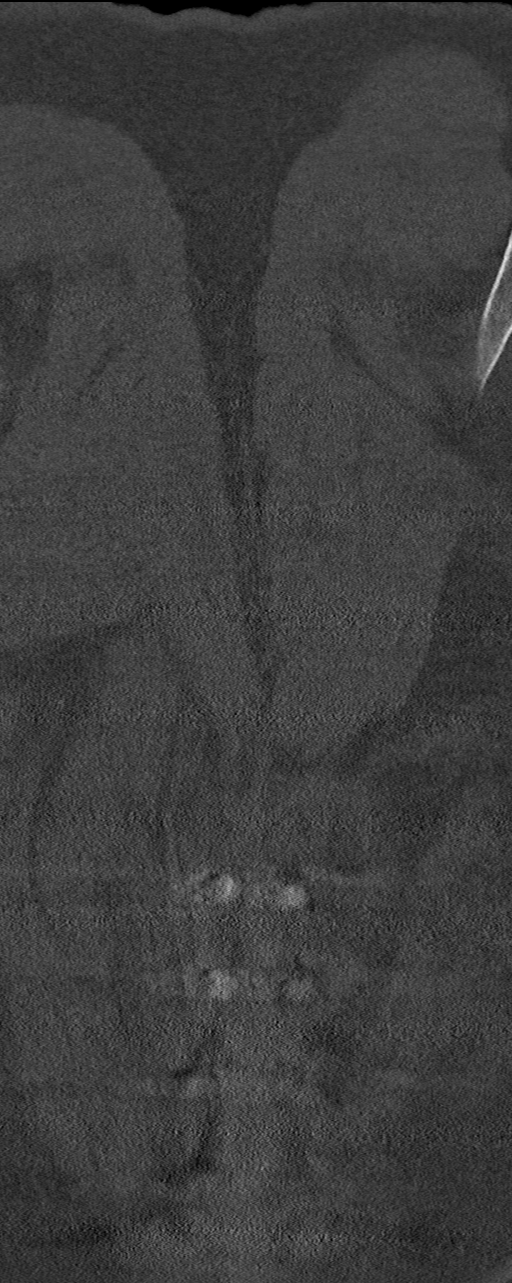
[im 30/74  bone]
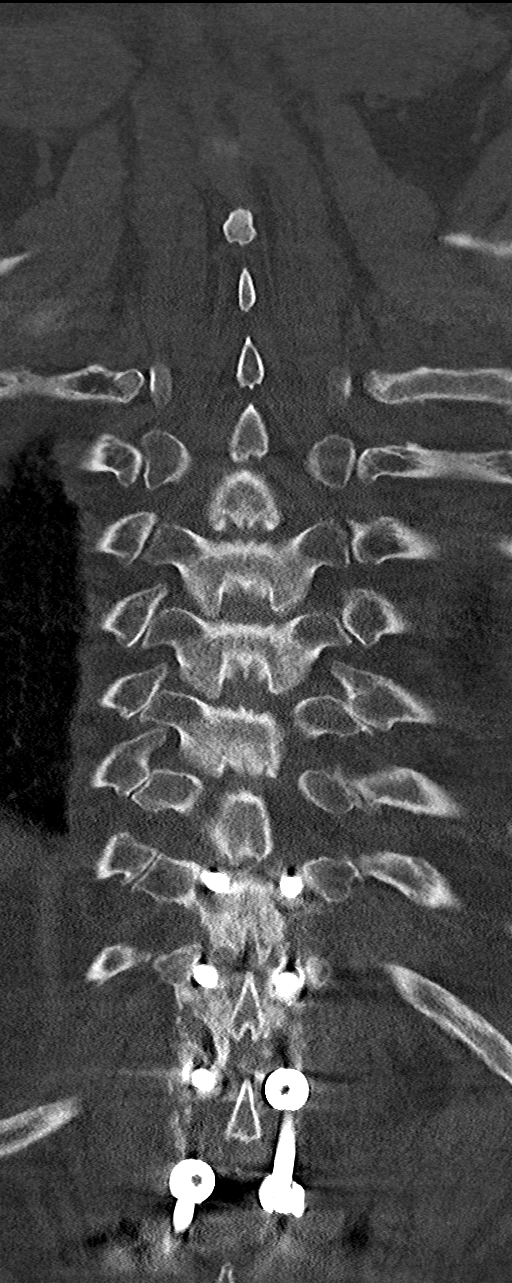
[im 44/74  bone]
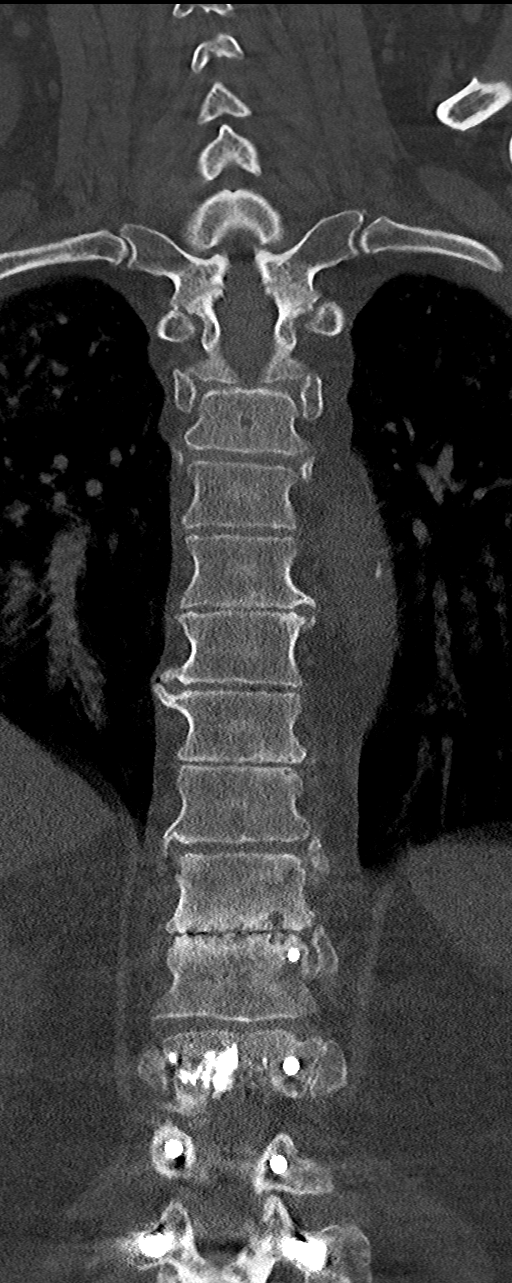

[Series 9: t-spine 2.0 orthogonal · axial · 0.21mm/px · z∈[+1040,+1209]mm · 3 of 194 slices shown]
[im 49/194  bone]
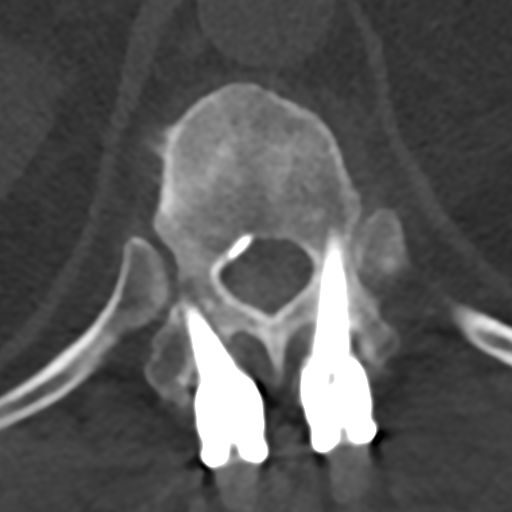
[im 97/194  bone]
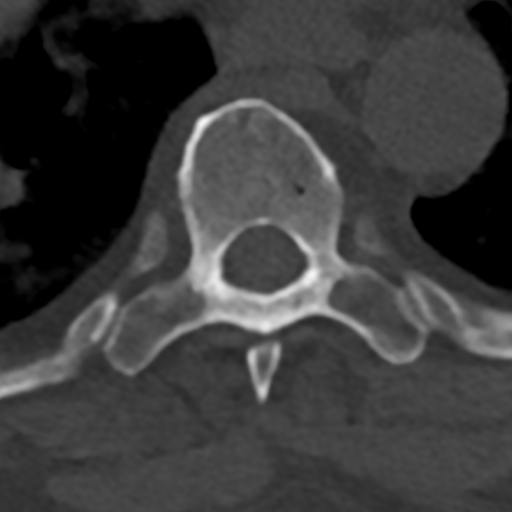
[im 145/194  bone]
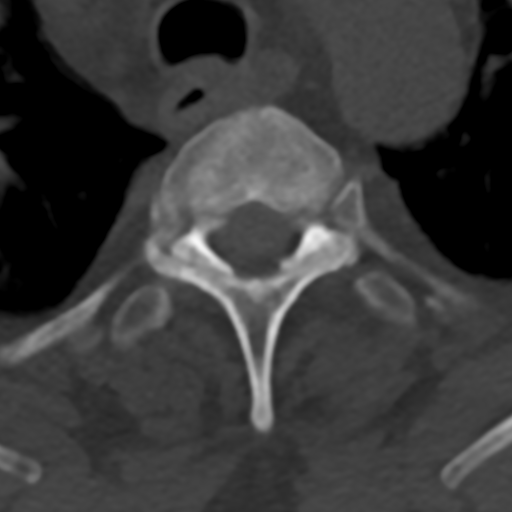

[11 of 33 positions shown; findings below may reference images not displayed]

FINDINGS: Limited cervical spine imaging: Chronic severe disc space loss at
C7-T1 with evidence of developing degenerative interbody ankylosis
there. Mild associated Ossification of the posterior longitudinal
ligament (OPLL).

Thoracic spine segmentation: Normal, same numbering system used in
[REDACTED].

Alignment: Stable thoracic vertebral height and alignment, including
mild degenerative appearing anterolisthesis of T2 on T3.

Vertebrae: Postoperative details are below. No acute osseous
abnormality identified. Previous T12 vertebral body augmentation
appears stable including a small volume of ventral epidural space
bone cement at T12 and T11, and involving the right T11 neural
foramen.

Paraspinal and other soft tissues: There is increased mild
paraspinal soft tissue inflammatory stranding since [REDACTED] at the
T10 level. See series 4, image 134. But this is fairly mild and
localized. Otherwise stable including postoperative changes to the
posterior lower thoracic paraspinal soft tissues.

Disc levels:

T1-T2 through T8-T9: Unchanged since [DATE]-T10: Subtle anterolisthesis. Stable disc space. Posterior disc
osteophyte complex and moderate posterior element hypertrophy.
Suspected mild spinal stenosis here.

T10-T11: Progressed vacuum disc here since [REDACTED]. Chronic
loosening of the bilateral T10 and right T11 pedicle screws. No
convincing arthrodesis. Degenerative bilateral T10 neural foraminal
stenosis, at least moderate and there is a small chronic focus of
gas in the left lateral recess near the proximal left T10 foramen
suggesting disc herniation.

T11-T12: Right T11 pedicle screw loosening is chronic. Mild
interbody vacuum phenomena is stable. Stable ventral and right
inferior foraminal epidural bone cement. Posterior element
hypertrophy with no convincing arthrodesis.

T12-L1: Stable vertebral body augmentation with ventral epidural
bone cement. No pedicle screw loosening. Decreased but not resolved
interbody vacuum phenomena. Chronic disc osteophyte complex
eccentric to the right. No convincing arthrodesis.
IMPRESSION: 1. Chronic T10 and T11 pedicle screw loosening with no convincing
postoperative arthrodesis in the lower thoracic spine through
T12-L1.

2. Subtle new paraspinal soft tissue inflammation at the T10 level,
but progressed vacuum disc at the adjacent T10-T11 space since
[REDACTED] which argues against discitis. No convincing endplate
changes of osteomyelitis. But if suspicion of spinal infection
persists Thoracic MRI here would be most sensitive and could be
compared to that on [DATE].

3. Otherwise stable postoperative appearance of the lower thoracic
spine including T12 vertebral body augmentation with small volume
epidural space bone cement.

4. Chronic adjacent segment disease at T9-T10 with suspected mild
spinal stenosis. And chronic severe disc space loss at C7-T1 with
evidence of developing degenerative interbody ankylosis.

## 2021-05-22 IMAGING — CT CT L SPINE W/O CM
3 series · 12 of 33 positions shown, 14 images · non-contrast
Comparison: Thoracic spine CT today reported separately. Lumbar CT
[DATE].

CLINICAL DATA: 65-year-old male with persistent thoracolumbar back
pain. Prior surgery with evidence of lower thoracic hardware
loosening by CT in [REDACTED].



[Series 3: l-spine 2.0 st · axial · 0.29mm/px · z∈[+820,+964]mm · 4 of 106 slices shown, 5 images]
[im 17/106  soft-tissue]
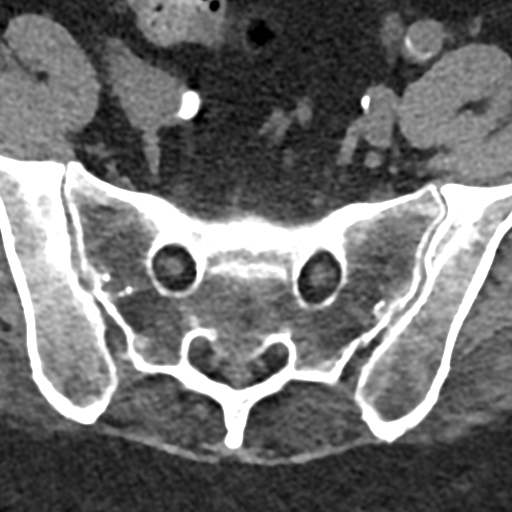
[im 17/106  bone]
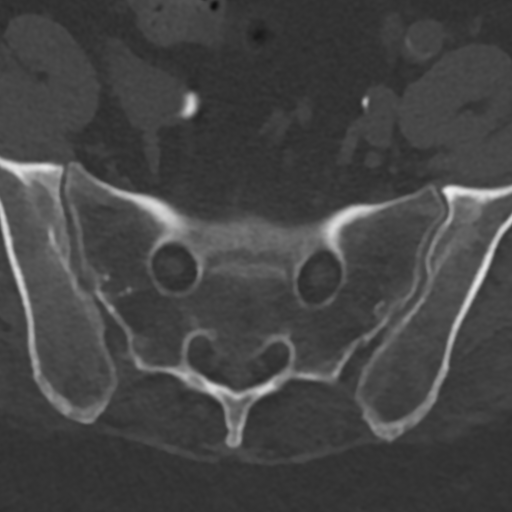
[im 41/106  bone]
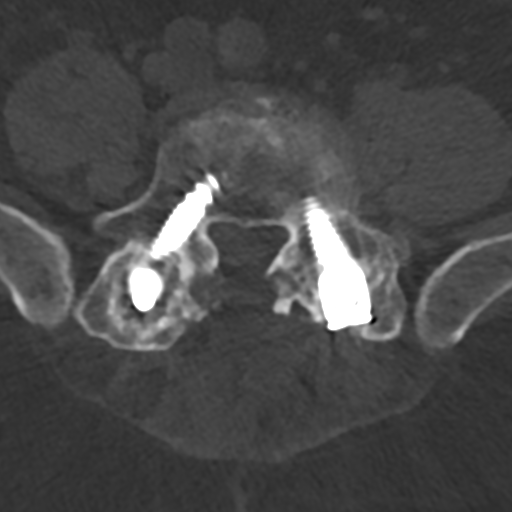
[im 65/106  bone]
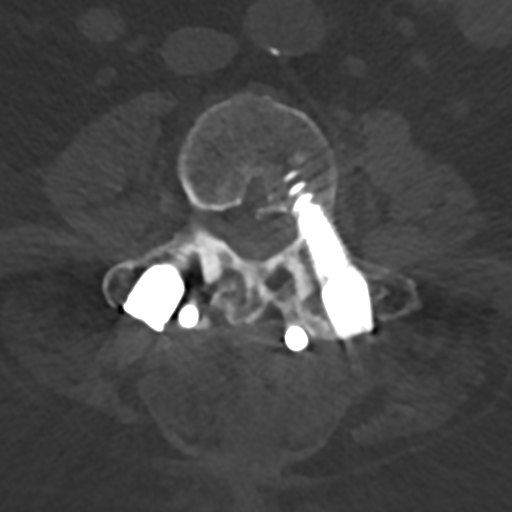
[im 89/106  bone]
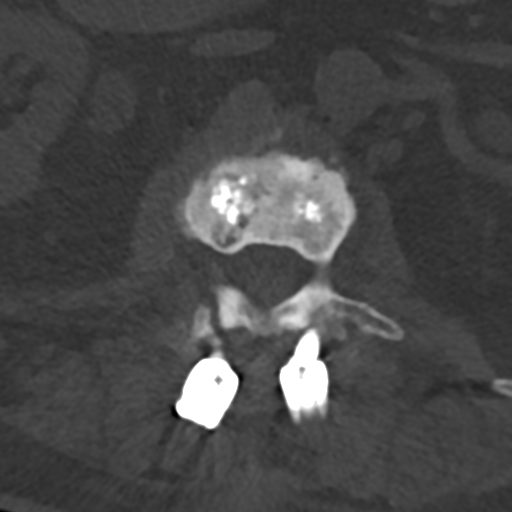

[Series 7: l-spine 2.0 cor · coronal · 0.31mm/px · 3 of 68 slices shown]
[im 14/68  bone]
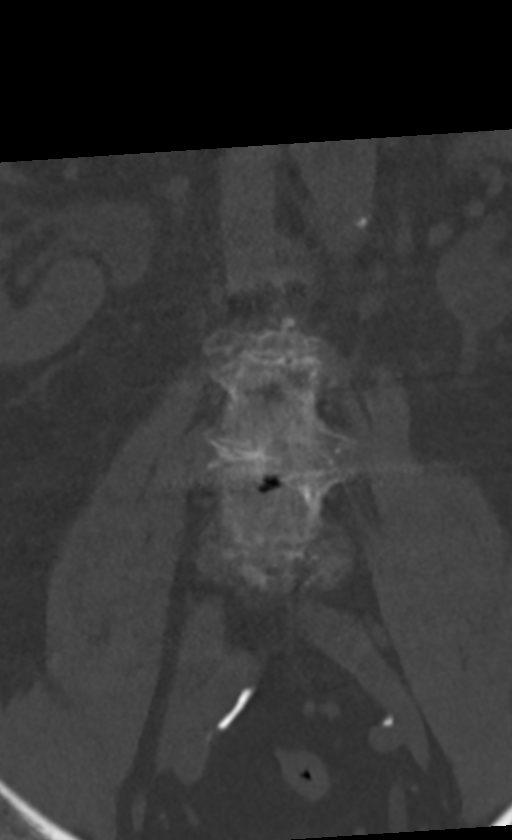
[im 27/68  bone]
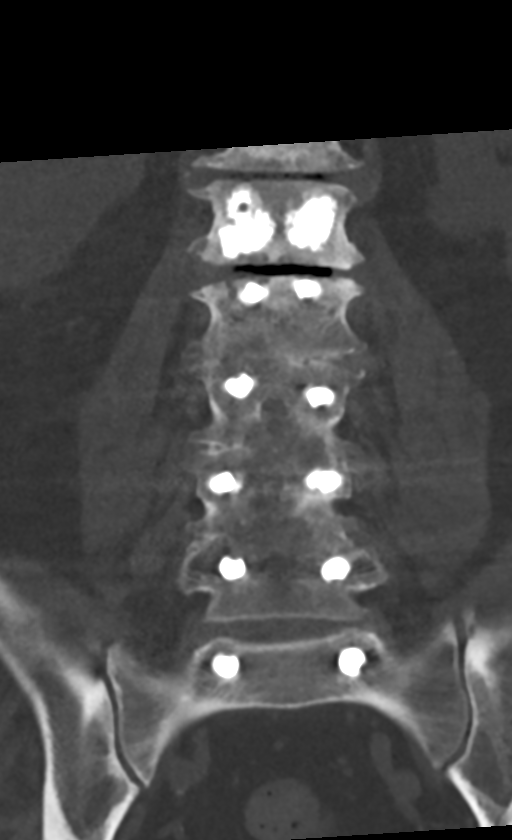
[im 41/68  bone]
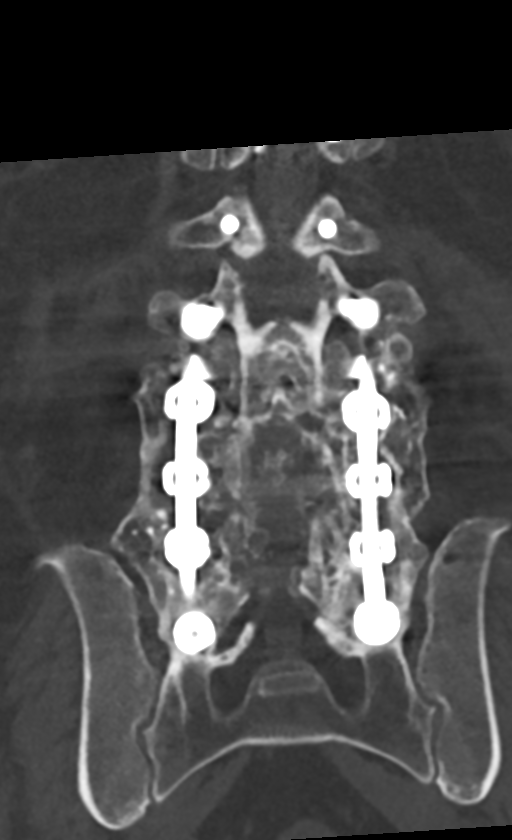

[Series 8: l-spine 2.0 sag · sagittal · 0.31mm/px · 5 of 61 slices shown, 6 images]
[im 21/61  bone]
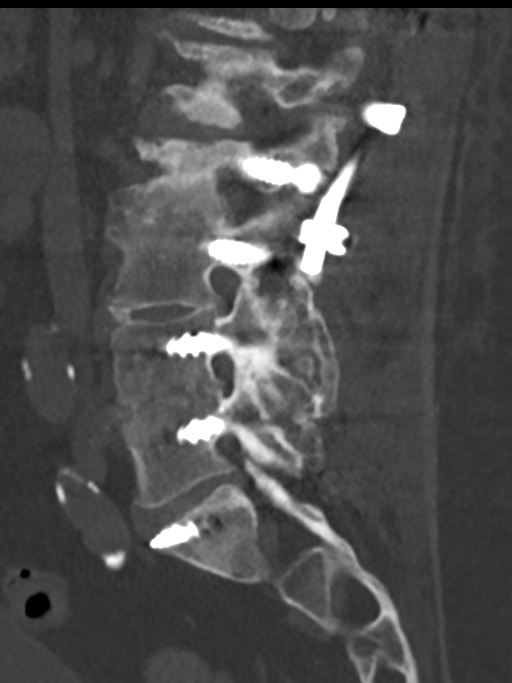
[im 26/61  bone]
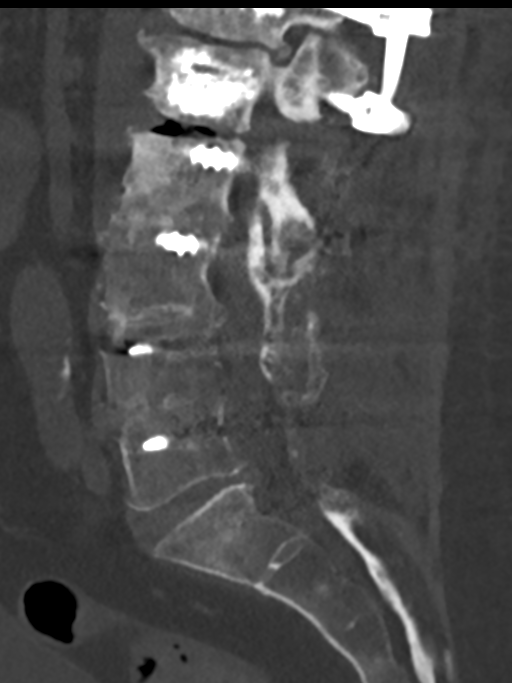
[im 31/61  soft-tissue]
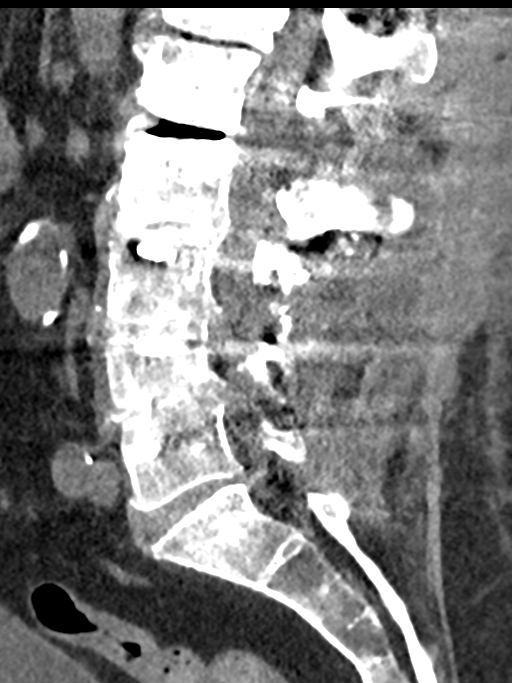
[im 31/61  bone]
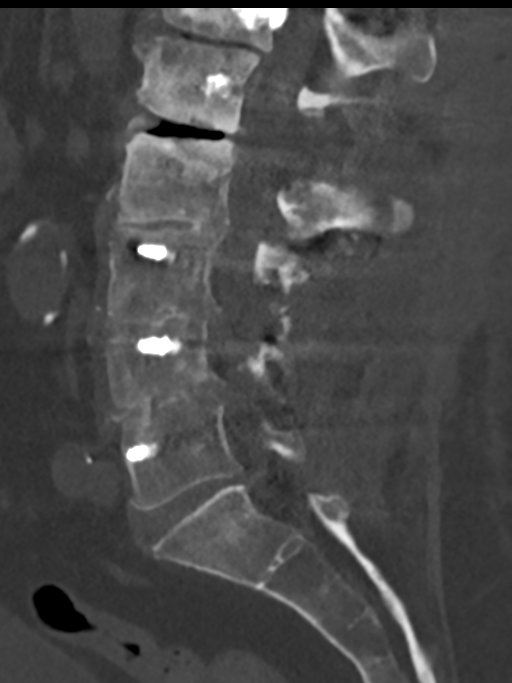
[im 36/61  bone]
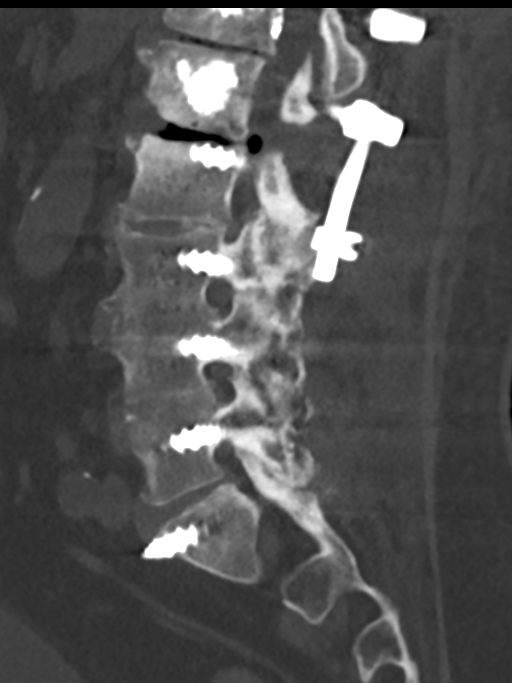
[im 41/61  bone]
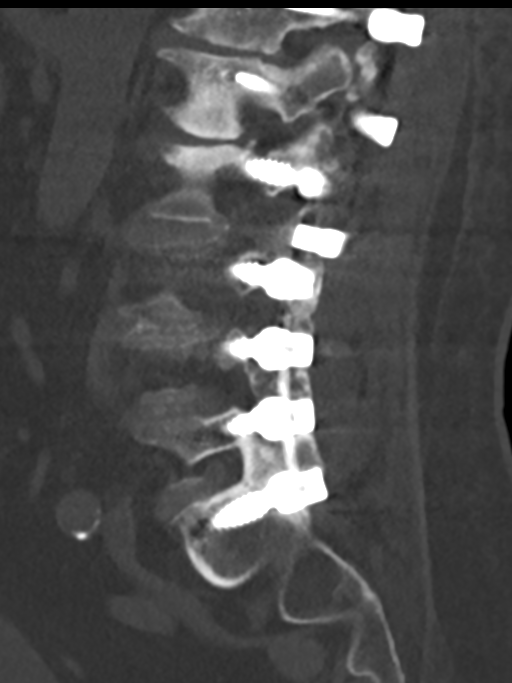

[12 of 33 positions shown; findings below may reference images not displayed]

FINDINGS: Segmentation: Normal, concordant with the thoracic spine numbering
today and the same numbering used in [REDACTED].

Alignment: Stable lumbar vertebral height and alignment since
[REDACTED].

Vertebrae: Postoperative details are below. No acute osseous
abnormality identified. Intact visible sacrum and SI joints.

Paraspinal and other soft tissues: Stable postoperative lumbar
paraspinal soft tissue changes. Aortoiliac calcified
atherosclerosis. Negative visible noncontrast abdominal viscera.
Partially visible bladder distension.

Disc levels:

T12-L1:  Reported with the thoracic spine separately.

L1-L2: Previous L1 vertebral body augmentation, posterior
decompression. Bilateral pedicle screws without evidence of hardware
loosening. But absent arthrodesis and pronounced interbody vacuum
phenomena with new 7 mm focus of gas in the left lateral recess
(series 4, image 21), descending left L2 nerve level.

L2-L3:  Stable chronic fusion with solid appearing arthrodesis.

L3-L4: Stable chronic decompression and fusion with solid appearing
arthrodesis.

L4-L5: Stable chronic decompression and fusion with solid appearing
arthrodesis.

L5-S1: Stable chronic decompression and fusion with solid appearing
posterior element arthrodesis.
IMPRESSION: 1. Solid postoperative spinal arthrodesis L2 through S1.
2. Previous decompression and fusion at L1-L2 but chronic
pseudoarthrosis with pronounced interbody vacuum phenomena, and
evidence of a new left lateral recess 7 mm disc herniation. Query
left L2 radiculitis. No hardware loosening in the lumbar spine.
3. See also CT Thoracic Spine reported separately.
4. Aortic Atherosclerosis ([HU]-[HU]).

## 2021-05-22 SURGERY — LUMBAR WOUND DEBRIDEMENT
Anesthesia: General | Site: Back

## 2021-05-22 MED ORDER — FENTANYL CITRATE (PF) 100 MCG/2ML IJ SOLN
25.0000 ug | INTRAMUSCULAR | Status: DC | PRN
  Administered 2021-05-22: 25 ug via INTRAVENOUS

## 2021-05-22 MED ORDER — ACETAMINOPHEN 650 MG RE SUPP: 650.0000 mg | RECTAL | Status: DC | PRN

## 2021-05-22 MED ORDER — CHLORHEXIDINE GLUCONATE 0.12 % MT SOLN
OROMUCOSAL | Status: AC
  Administered 2021-05-22: 15 mL
  Filled 2021-05-22: qty 15

## 2021-05-22 MED ORDER — LIDOCAINE-EPINEPHRINE 1 %-1:100000 IJ SOLN
INTRAMUSCULAR | Status: AC
  Filled 2021-05-22: qty 1

## 2021-05-22 MED ORDER — LORATADINE 10 MG PO TABS
10.0000 mg | ORAL_TABLET | Freq: Every day | ORAL | Status: DC
  Administered 2021-05-22 – 2021-05-24 (×3): 10 mg via ORAL
  Filled 2021-05-22 (×3): qty 1

## 2021-05-22 MED ORDER — PHENYLEPHRINE 40 MCG/ML (10ML) SYRINGE FOR IV PUSH (FOR BLOOD PRESSURE SUPPORT)
PREFILLED_SYRINGE | INTRAVENOUS | Status: AC
  Filled 2021-05-22: qty 10

## 2021-05-22 MED ORDER — EPHEDRINE SULFATE-NACL 50-0.9 MG/10ML-% IV SOSY
PREFILLED_SYRINGE | INTRAVENOUS | Status: DC | PRN
  Administered 2021-05-22: 5 mg via INTRAVENOUS

## 2021-05-22 MED ORDER — 0.9 % SODIUM CHLORIDE (POUR BTL) OPTIME
TOPICAL | Status: DC | PRN
  Administered 2021-05-22: 1000 mL

## 2021-05-22 MED ORDER — CARVEDILOL 25 MG PO TABS: 25.0000 mg | ORAL_TABLET | Freq: Two times a day (BID) | ORAL | Status: DC

## 2021-05-22 MED ORDER — ALUM & MAG HYDROXIDE-SIMETH 200-200-20 MG/5ML PO SUSP: 30.0000 mL | Freq: Four times a day (QID) | ORAL | Status: DC | PRN

## 2021-05-22 MED ORDER — MELOXICAM 7.5 MG PO TABS
15.0000 mg | ORAL_TABLET | Freq: Every day | ORAL | Status: DC
  Administered 2021-05-22: 15 mg via ORAL
  Filled 2021-05-22: qty 2

## 2021-05-22 MED ORDER — PHENYLEPHRINE 40 MCG/ML (10ML) SYRINGE FOR IV PUSH (FOR BLOOD PRESSURE SUPPORT)
PREFILLED_SYRINGE | INTRAVENOUS | Status: DC | PRN
  Administered 2021-05-22 (×3): 80 ug via INTRAVENOUS

## 2021-05-22 MED ORDER — ONDANSETRON HCL 4 MG/2ML IJ SOLN: 4.0000 mg | Freq: Four times a day (QID) | INTRAMUSCULAR | Status: DC | PRN

## 2021-05-22 MED ORDER — ONDANSETRON HCL 4 MG/2ML IJ SOLN
INTRAMUSCULAR | Status: AC
  Filled 2021-05-22: qty 2

## 2021-05-22 MED ORDER — SODIUM CHLORIDE 0.9 % IV SOLN
250.0000 mL | INTRAVENOUS | Status: DC
  Administered 2021-05-23: 250 mL via INTRAVENOUS

## 2021-05-22 MED ORDER — SUGAMMADEX SODIUM 200 MG/2ML IV SOLN
INTRAVENOUS | Status: DC | PRN
  Administered 2021-05-22: 200 mg via INTRAVENOUS

## 2021-05-22 MED ORDER — ONDANSETRON HCL 4 MG/2ML IJ SOLN: 4.0000 mg | Freq: Once | INTRAMUSCULAR | Status: DC | PRN

## 2021-05-22 MED ORDER — DILTIAZEM HCL ER COATED BEADS 120 MG PO CP24: 240.0000 mg | ORAL_CAPSULE | Freq: Every day | ORAL | Status: DC

## 2021-05-22 MED ORDER — LIDOCAINE 2% (20 MG/ML) 5 ML SYRINGE
INTRAMUSCULAR | Status: AC
  Filled 2021-05-22: qty 5

## 2021-05-22 MED ORDER — METOPROLOL TARTRATE 5 MG/5ML IV SOLN: 5.0000 mg | Freq: Four times a day (QID) | INTRAVENOUS | Status: DC | PRN

## 2021-05-22 MED ORDER — HEMOSTATIC AGENTS (NO CHARGE) OPTIME
TOPICAL | Status: DC | PRN
  Administered 2021-05-22: 1 via TOPICAL

## 2021-05-22 MED ORDER — LIDOCAINE-EPINEPHRINE 1 %-1:100000 IJ SOLN
INTRAMUSCULAR | Status: DC | PRN
  Administered 2021-05-22: 10 mL

## 2021-05-22 MED ORDER — ONDANSETRON HCL 4 MG PO TABS: 4.0000 mg | ORAL_TABLET | Freq: Four times a day (QID) | ORAL | Status: DC | PRN

## 2021-05-22 MED ORDER — BUPIVACAINE LIPOSOME 1.3 % IJ SUSP
INTRAMUSCULAR | Status: AC
  Filled 2021-05-22: qty 20

## 2021-05-22 MED ORDER — FENTANYL CITRATE (PF) 100 MCG/2ML IJ SOLN
INTRAMUSCULAR | Status: AC
  Filled 2021-05-22: qty 2

## 2021-05-22 MED ORDER — PROPOFOL 10 MG/ML IV BOLUS
INTRAVENOUS | Status: AC
  Filled 2021-05-22: qty 20

## 2021-05-22 MED ORDER — PANTOPRAZOLE SODIUM 40 MG IV SOLR: 40.0000 mg | Freq: Every day | INTRAVENOUS | Status: DC

## 2021-05-22 MED ORDER — ASPIRIN 81 MG PO CHEW
81.0000 mg | CHEWABLE_TABLET | Freq: Every day | ORAL | Status: DC
Start: 2021-05-22 — End: 2021-05-24
  Administered 2021-05-22 – 2021-05-24 (×3): 81 mg via ORAL
  Filled 2021-05-22 (×3): qty 1

## 2021-05-22 MED ORDER — METOPROLOL SUCCINATE ER 25 MG PO TB24
25.0000 mg | ORAL_TABLET | Freq: Every day | ORAL | Status: DC
  Administered 2021-05-22 – 2021-05-24 (×3): 25 mg via ORAL
  Filled 2021-05-22 (×3): qty 1

## 2021-05-22 MED ORDER — LISINOPRIL 10 MG PO TABS
10.0000 mg | ORAL_TABLET | Freq: Every day | ORAL | Status: DC
  Administered 2021-05-22 – 2021-05-24 (×3): 10 mg via ORAL
  Filled 2021-05-22 (×3): qty 1

## 2021-05-22 MED ORDER — DEXAMETHASONE SODIUM PHOSPHATE 10 MG/ML IJ SOLN
INTRAMUSCULAR | Status: DC | PRN
Start: 2021-05-22 — End: 2021-05-22

## 2021-05-22 MED ORDER — ONDANSETRON HCL 4 MG/2ML IJ SOLN
INTRAMUSCULAR | Status: DC | PRN
  Administered 2021-05-22: 4 mg via INTRAVENOUS

## 2021-05-22 MED ORDER — CYCLOBENZAPRINE HCL 10 MG PO TABS
10.0000 mg | ORAL_TABLET | Freq: Three times a day (TID) | ORAL | Status: DC | PRN
  Administered 2021-05-22 – 2021-05-24 (×2): 10 mg via ORAL
  Filled 2021-05-22 (×2): qty 1

## 2021-05-22 MED ORDER — PHENYLEPHRINE HCL-NACL 20-0.9 MG/250ML-% IV SOLN
INTRAVENOUS | Status: DC | PRN
  Administered 2021-05-22: 40 ug/min via INTRAVENOUS

## 2021-05-22 MED ORDER — MENTHOL 3 MG MT LOZG: 1.0000 | LOZENGE | OROMUCOSAL | Status: DC | PRN

## 2021-05-22 MED ORDER — LACTATED RINGERS IV SOLN: INTRAVENOUS | Status: DC | PRN

## 2021-05-22 MED ORDER — ACETAMINOPHEN 10 MG/ML IV SOLN: 1000.0000 mg | Freq: Once | INTRAVENOUS | Status: DC | PRN

## 2021-05-22 MED ORDER — METFORMIN HCL 500 MG PO TABS
500.0000 mg | ORAL_TABLET | Freq: Two times a day (BID) | ORAL | Status: DC
Start: 2021-05-22 — End: 2021-05-24
  Administered 2021-05-22 – 2021-05-24 (×4): 500 mg via ORAL
  Filled 2021-05-22 (×4): qty 1

## 2021-05-22 MED ORDER — BUPIVACAINE LIPOSOME 1.3 % IJ SUSP
INTRAMUSCULAR | Status: DC | PRN
  Administered 2021-05-22: 20 mL

## 2021-05-22 MED ORDER — ACETAMINOPHEN 325 MG PO TABS: 650.0000 mg | ORAL_TABLET | ORAL | Status: DC | PRN

## 2021-05-22 MED ORDER — ROCURONIUM BROMIDE 10 MG/ML (PF) SYRINGE
PREFILLED_SYRINGE | INTRAVENOUS | Status: AC
  Filled 2021-05-22: qty 10

## 2021-05-22 MED ORDER — VANCOMYCIN HCL 1000 MG IV SOLR
INTRAVENOUS | Status: AC
  Filled 2021-05-22: qty 20

## 2021-05-22 MED ORDER — MORPHINE SULFATE (PF) 4 MG/ML IV SOLN
4.0000 mg | INTRAVENOUS | Status: DC | PRN
  Administered 2021-05-22 – 2021-05-24 (×10): 4 mg via INTRAVENOUS
  Filled 2021-05-22 (×10): qty 1

## 2021-05-22 MED ORDER — DEXAMETHASONE SODIUM PHOSPHATE 10 MG/ML IJ SOLN
INTRAMUSCULAR | Status: AC
  Filled 2021-05-22: qty 1

## 2021-05-22 MED ORDER — ATORVASTATIN CALCIUM 40 MG PO TABS
40.0000 mg | ORAL_TABLET | Freq: Every day | ORAL | Status: DC
  Administered 2021-05-22 – 2021-05-24 (×3): 40 mg via ORAL
  Filled 2021-05-22 (×3): qty 1

## 2021-05-22 MED ORDER — ROCURONIUM BROMIDE 10 MG/ML (PF) SYRINGE
PREFILLED_SYRINGE | INTRAVENOUS | Status: DC | PRN
  Administered 2021-05-22: 50 mg via INTRAVENOUS
  Administered 2021-05-22: 30 mg via INTRAVENOUS

## 2021-05-22 MED ORDER — MORPHINE SULFATE (PF) 2 MG/ML IV SOLN: 2.0000 mg | INTRAVENOUS | Status: DC | PRN

## 2021-05-22 MED ORDER — INSULIN ASPART 100 UNIT/ML IJ SOLN
0.0000 [IU] | Freq: Three times a day (TID) | INTRAMUSCULAR | Status: DC
  Administered 2021-05-22: 2 [IU] via SUBCUTANEOUS
  Administered 2021-05-23: 3 [IU] via SUBCUTANEOUS

## 2021-05-22 MED ORDER — EPHEDRINE 5 MG/ML INJ
INTRAVENOUS | Status: AC
  Filled 2021-05-22: qty 5

## 2021-05-22 MED ORDER — FENTANYL CITRATE (PF) 250 MCG/5ML IJ SOLN
INTRAMUSCULAR | Status: AC
  Filled 2021-05-22: qty 5

## 2021-05-22 MED ORDER — DEXAMETHASONE SODIUM PHOSPHATE 10 MG/ML IJ SOLN
INTRAMUSCULAR | Status: DC | PRN
  Administered 2021-05-22: 5 mg via INTRAVENOUS

## 2021-05-22 MED ORDER — THROMBIN 5000 UNITS EX SOLR
CUTANEOUS | Status: AC
  Filled 2021-05-22: qty 10000

## 2021-05-22 MED ORDER — MIDAZOLAM HCL 2 MG/2ML IJ SOLN
INTRAMUSCULAR | Status: AC
  Filled 2021-05-22: qty 2

## 2021-05-22 MED ORDER — PHENOL 1.4 % MT LIQD: 1.0000 | OROMUCOSAL | Status: DC | PRN

## 2021-05-22 MED ORDER — AMISULPRIDE (ANTIEMETIC) 5 MG/2ML IV SOLN: 10.0000 mg | Freq: Once | INTRAVENOUS | Status: DC | PRN

## 2021-05-22 MED ORDER — SODIUM CHLORIDE 0.9% FLUSH: 3.0000 mL | INTRAVENOUS | Status: DC | PRN

## 2021-05-22 MED ORDER — FENTANYL CITRATE (PF) 250 MCG/5ML IJ SOLN
INTRAMUSCULAR | Status: DC | PRN
  Administered 2021-05-22: 50 ug via INTRAVENOUS
  Administered 2021-05-22: 150 ug via INTRAVENOUS

## 2021-05-22 MED ORDER — MIDAZOLAM HCL 2 MG/2ML IJ SOLN
INTRAMUSCULAR | Status: DC | PRN
  Administered 2021-05-22: 2 mg via INTRAVENOUS

## 2021-05-22 MED ORDER — INSULIN ASPART 100 UNIT/ML IJ SOLN
4.0000 [IU] | Freq: Three times a day (TID) | INTRAMUSCULAR | Status: DC
  Administered 2021-05-22 – 2021-05-23 (×2): 4 [IU] via SUBCUTANEOUS

## 2021-05-22 MED ORDER — THROMBIN 5000 UNITS EX SOLR
CUTANEOUS | Status: DC | PRN
  Administered 2021-05-22 (×2): 5000 [IU] via TOPICAL

## 2021-05-22 MED ORDER — LIDOCAINE 2% (20 MG/ML) 5 ML SYRINGE
INTRAMUSCULAR | Status: DC | PRN
  Administered 2021-05-22: 60 mg via INTRAVENOUS

## 2021-05-22 MED ORDER — SODIUM CHLORIDE 0.9% FLUSH
3.0000 mL | Freq: Two times a day (BID) | INTRAVENOUS | Status: DC
  Administered 2021-05-22 – 2021-05-24 (×4): 3 mL via INTRAVENOUS

## 2021-05-22 MED ORDER — PROPOFOL 10 MG/ML IV BOLUS
INTRAVENOUS | Status: DC | PRN
  Administered 2021-05-22: 50 mg via INTRAVENOUS
  Administered 2021-05-22: 100 mg via INTRAVENOUS

## 2021-05-22 MED ORDER — PHENYTOIN SODIUM EXTENDED 100 MG PO CAPS
100.0000 mg | ORAL_CAPSULE | Freq: Three times a day (TID) | ORAL | Status: DC
  Administered 2021-05-22 – 2021-05-24 (×6): 100 mg via ORAL
  Filled 2021-05-22 (×6): qty 1

## 2021-05-22 SURGICAL SUPPLY — 50 items
BAG COUNTER SPONGE SURGICOUNT (BAG) ×2 IMPLANT
BENZOIN TINCTURE PRP APPL 2/3 (GAUZE/BANDAGES/DRESSINGS) ×2 IMPLANT
BLADE CLIPPER SURG (BLADE) IMPLANT
CANISTER SUCT 3000ML PPV (MISCELLANEOUS) ×2 IMPLANT
CARTRIDGE OIL MAESTRO DRILL (MISCELLANEOUS) ×1 IMPLANT
DECANTER SPIKE VIAL GLASS SM (MISCELLANEOUS) ×3 IMPLANT
DERMABOND ADVANCED (GAUZE/BANDAGES/DRESSINGS) ×1
DERMABOND ADVANCED .7 DNX12 (GAUZE/BANDAGES/DRESSINGS) ×1 IMPLANT
DIFFUSER DRILL AIR PNEUMATIC (MISCELLANEOUS) ×2 IMPLANT
DRAPE LAPAROTOMY 100X72X124 (DRAPES) ×2 IMPLANT
DRAPE SURG 17X23 STRL (DRAPES) IMPLANT
DRSG OPSITE 4X5.5 SM (GAUZE/BANDAGES/DRESSINGS) ×1 IMPLANT
DRSG OPSITE POSTOP 4X8 (GAUZE/BANDAGES/DRESSINGS) ×1 IMPLANT
ELECT REM PT RETURN 9FT ADLT (ELECTROSURGICAL) ×2
ELECTRODE REM PT RTRN 9FT ADLT (ELECTROSURGICAL) ×1 IMPLANT
EVACUATOR 1/8 PVC DRAIN (DRAIN) ×1 IMPLANT
GAUZE 4X4 16PLY ~~LOC~~+RFID DBL (SPONGE) ×1 IMPLANT
GAUZE SPONGE 4X4 12PLY STRL (GAUZE/BANDAGES/DRESSINGS) ×2 IMPLANT
GLOVE SURG ENC MOIS LTX SZ7 (GLOVE) ×1 IMPLANT
GLOVE SURG ENC MOIS LTX SZ8 (GLOVE) ×2 IMPLANT
GLOVE SURG UNDER POLY LF SZ7 (GLOVE) ×1 IMPLANT
GLOVE SURG UNDER POLY LF SZ8.5 (GLOVE) ×2 IMPLANT
GOWN STRL REUS W/ TWL LRG LVL3 (GOWN DISPOSABLE) ×1 IMPLANT
GOWN STRL REUS W/ TWL XL LVL3 (GOWN DISPOSABLE) ×1 IMPLANT
GOWN STRL REUS W/TWL 2XL LVL3 (GOWN DISPOSABLE) IMPLANT
GOWN STRL REUS W/TWL LRG LVL3 (GOWN DISPOSABLE) ×2
GOWN STRL REUS W/TWL XL LVL3 (GOWN DISPOSABLE) ×2
KIT BASIN OR (CUSTOM PROCEDURE TRAY) ×2 IMPLANT
KIT TURNOVER KIT B (KITS) ×2 IMPLANT
NDL HYPO 21X1.5 ECLIPSE (NEEDLE) IMPLANT
NDL HYPO 25X1 1.5 SAFETY (NEEDLE) IMPLANT
NEEDLE HYPO 21X1.5 ECLIPSE (NEEDLE) ×2 IMPLANT
NEEDLE HYPO 25X1 1.5 SAFETY (NEEDLE) ×2 IMPLANT
NS IRRIG 1000ML POUR BTL (IV SOLUTION) ×2 IMPLANT
OIL CARTRIDGE MAESTRO DRILL (MISCELLANEOUS) ×2
PACK LAMINECTOMY NEURO (CUSTOM PROCEDURE TRAY) ×2 IMPLANT
PATTIES SURGICAL .75X.75 (GAUZE/BANDAGES/DRESSINGS) IMPLANT
SPONGE SURGIFOAM ABS GEL SZ50 (HEMOSTASIS) ×2 IMPLANT
STRIP CLOSURE SKIN 1/2X4 (GAUZE/BANDAGES/DRESSINGS) ×3 IMPLANT
SUT VIC AB 0 CT1 18XCR BRD8 (SUTURE) ×1 IMPLANT
SUT VIC AB 0 CT1 8-18 (SUTURE) ×1
SUT VIC AB 2-0 CT1 18 (SUTURE) ×3 IMPLANT
SUT VIC AB 4-0 PS2 27 (SUTURE) ×2 IMPLANT
SWAB COLLECTION DEVICE MRSA (MISCELLANEOUS) ×4 IMPLANT
SWAB CULTURE ESWAB REG 1ML (MISCELLANEOUS) ×4 IMPLANT
SYR 20CC LL (SYRINGE) ×1 IMPLANT
SYR CONTROL 10ML LL (SYRINGE) IMPLANT
TOWEL GREEN STERILE (TOWEL DISPOSABLE) ×2 IMPLANT
TOWEL GREEN STERILE FF (TOWEL DISPOSABLE) ×2 IMPLANT
WATER STERILE IRR 1000ML POUR (IV SOLUTION) ×2 IMPLANT

## 2021-05-22 NOTE — Progress Notes (Signed)
Subjective: ?Patient reports  increased drainage increased feeling of myalgias throughout the weekend presented to the [facility] went home health noted significant increased erythema and drainage at the superior aspect of his incision. ? ?Objective: ?Vital signs in last 24 hours: ?Temp:  [97.7 ?F (36.5 ?C)-98.4 ?F (36.9 ?C)] 98.2 ?F (36.8 ?C) (03/15 3154) ?Pulse Rate:  [64-124] 87 (03/15 0806) ?Resp:  [10-20] 14 (03/15 0806) ?BP: (101-162)/(50-138) 121/87 (03/15 0841) ?SpO2:  [90 %-100 %] 95 % (03/15 0806) ?Weight:  [89.7 kg-90.3 kg] 89.7 kg (03/15 0005) ? ?Intake/Output from previous day: ?03/14 0701 - 03/15 0700 ?In: 1686.8 [IV Piggyback:1686.8] ?Out: 2030 [Urine:2030] ?Intake/Output this shift: ?Total I/O ?In: 400 [P.O.:400] ?Out: 400 [Urine:400] ? ?Supra hospitalization showing much more erythema redness swelling bogginess consistent with possible of fluid collection underneath.  Could not express any drainage ? ?Lab Results: ?Recent Labs  ?  05/21/21 ?1440 05/22/21 ?0353  ?WBC 5.9 5.1  ?HGB 13.5 11.2*  ?HCT 38.9* 31.1*  ?PLT 185 144*  ? ?BMET ?Recent Labs  ?  05/21/21 ?1440 05/22/21 ?0353  ?NA 132* 136  ?K 4.0 3.7  ?CL 94* 101  ?CO2 25 25  ?GLUCOSE 119* 98  ?BUN 14 8  ?CREATININE 0.76 0.71  ?CALCIUM 9.2 8.7*  ? ? ?Studies/Results: ?DG Chest Port 1 View ? ?Result Date: 05/21/2021 ?CLINICAL DATA:  Concern for sepsis EXAM: PORTABLE CHEST 1 VIEW COMPARISON:  11/29/2020 FINDINGS: The heart size and mediastinal contours are within normal limits. Both lungs are clear. The visualized skeletal structures are unremarkable. IMPRESSION: No acute abnormality of the lungs in AP portable projection. Electronically Signed   By: Delanna Ahmadi M.D.   On: 05/21/2021 14:57   ? ?Assessment/Plan: ?66 year old gentleman whose had a known lumbar wound infection been on IV antibiotics and oral antibiotics however appears to be not responding to treatment as increased swelling around the supra abdomen incision increased erythema and  drainage.  So we will plan on repeat I&D this evening repeat cultures ID consult stat CT this afternoon. ? LOS: 1 day  ? ? ? ?Elaina Hoops ?05/22/2021, 9:59 AM ? ? ? ? ?

## 2021-05-22 NOTE — Op Note (Signed)
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 ?Postoperative diagnosis: Same ? ?Procedure: I&D of thoracolumbar wound with opening up the superior aspect of thoracic incision identification of the hardware removal of bilateral T10, T11 screws with cut the rod below T11 ? ?Surgeon: Dominica Severin Mishika Flippen ? ?Center: [facility] ? ?Anesthesia: General ? ?EBL: Minimal ? ?Cultures: Bilateral T10 pedicles and subcutaneous fluid ? ?HPI: 66 year old gentleman with known lumbar wound infection originally washed out back in September has been on IV and subsequent oral antibiotics treating a pansensitive staph infection.  Patient presented with increased redness cellulitis around the superior aspect incision with increased drainage over the last few days.  Work-up with follow-up CT scan showed persistent loosening of T10 and T11 screws.  Due to patient's persistent infection and draining wound and loose screws I recommended I&D of lumbar wound with removal of loose pedicle screws at T10 and T11.  I extensively went over the risks and benefits of that operation with him as well as perioperative course expectations of outcome and alternatives of surgery and he understands and agrees to proceed forward. ? ?Operative procedure: Patient was brought into the OR was [facility] general anesthesia positioned prone on the Wilson frame his back was prepped and draped in routine sterile fashion.  Superior aspect of his incision was opened up and the scar was partially ellipticized culture the subcutaneous fluid there is no frank pus visualized.  And overall it did not look like it was overwhelmingly infected.  I dissected down to the hardware there was some clear fluid coming up around the T10 screws bilaterally I think this is more related to the loosening and chronic micromotion going on there.  However we cut the rod below T11 remove the knots remove the rods remove the screws I did culture the T10  pedicles bilaterally.  The wound was then copiously irrigated physical hemostasis was maintained Exparel was injected in the fascia and a medium Hemovac drain was placed and the wound was closed in layers with interrupted Vicryl and a running 4 subcuticular.  Dermabond for benzoin Steri-Strips were applied wound was dressed patient recovery in stable condition.  At the end the case all needle count sponge counts were correct. ?

## 2021-05-22 NOTE — Assessment & Plan Note (Signed)
Take Sinemet, Dilantin, continue ?

## 2021-05-22 NOTE — Assessment & Plan Note (Signed)
Takes metformin at home.  Continue sliding scale insulin here.  Monitor blood sugars ?

## 2021-05-22 NOTE — Anesthesia Postprocedure Evaluation (Signed)
Anesthesia Post Note ? ?Patient: Danny Hampton ? ?Procedure(s) Performed: Thoracic wound debridement with removal of hardware at Thoracic ten-eleven (Back) ? ?  ? ?Patient location during evaluation: PACU ?Anesthesia Type: General ?Level of consciousness: awake ?Pain management: pain level controlled ?Vital Signs Assessment: post-procedure vital signs reviewed and stable ?Respiratory status: spontaneous breathing, nonlabored ventilation, respiratory function stable and patient connected to nasal cannula oxygen ?Cardiovascular status: blood pressure returned to baseline and stable ?Postop Assessment: no apparent nausea or vomiting ?Anesthetic complications: no ? ? ?No notable events documented. ? ?Last Vitals:  ?Vitals:  ? 05/22/21 2000 05/22/21 2024  ?BP: 114/70 122/74  ?Pulse: 65 64  ?Resp: 20   ?Temp: 37 ?C 36.6 ?C  ?SpO2: 99% 93%  ?  ?Last Pain:  ?Vitals:  ? 05/22/21 2024  ?TempSrc: Oral  ?PainSc: 7   ? ? ?  ?  ?  ?  ?  ?  ? ?Davine Sweney P Shalena Ezzell ? ? ? ? ?

## 2021-05-22 NOTE — Assessment & Plan Note (Signed)
On lipitor

## 2021-05-22 NOTE — Progress Notes (Signed)
?PROGRESS NOTE ? ?Danny Hampton  INO:[id number] DOB: February 23, 1956 DOA: 05/21/2021 ?PCP: System, Provider Not In  ? ?Brief Narrative: ?Patient is a 66 year old male with history of hypertension, hyperlipidemia, myotonia congentis, multiple surgeries on the spine most recently in August 2022 with fixation of S96-G8 complicated by postoperative wound infection requiring debridement, sepsis, MSSA bacteremia taking Keflex at home presented to the emergency department with complaints of purulent drainage with order from the surgical wound.  CT scan had showed lucency along both T10 transpedicular screws which may indicate loosening.  There was report of purulent drainage with order, lies, generalized weakness,increased erythema and induration around the wound.  Patient was suggested by neurosurgery to come to the hospital.  Patient has been started on  vancomycin .  Given IV fluids.  ID consulted.  Neurosurgery following ? ? ?Assessment & Plan: ? ?Principal Problem: ?  Postoperative wound infection ?Active Problems: ?  MSSA bacteremia ?  GERD (gastroesophageal reflux disease) ?  Hyperglycemia due to diabetes mellitus ([place]) ?  Myotonia congenita ?  Primary hypertension ?  Hyperlipidemia ?  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 ? ?Assessment and Plan: ?* Postoperative wound infection ?Presented with erythema, drainage, foul smell from the surgical wound on the upper back.  Continue broad-spectrum antibiotics.  Follow-up cultures.  Neurosurgery following.  Planning for repeat I&D ? ?MSSA bacteremia ?New blood cultures have been pending.  No evidence of sepsis at this time.  No leukocytosis or lactic acidosis.  Currently on vancomycin.  Infectious disease consulted ? 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 ?Hyperlipidemia ?On lipitor ? ?Primary hypertension ?Monitor blood pressure.   Continue Coreg, Cardizem, lisinopril, Toprol ? ?Myotonia congenita ?Take Sinemet, Dilantin, continue ? ?Hyperglycemia due to diabetes mellitus ([place]) ?Takes metformin at home.  Continue sliding scale insulin here.  Monitor blood sugars ? ?GERD (gastroesophageal reflux disease) ?Continue Protonix ? ?Obesity: BMI 32.9 ? ? ?  ? ? ?  ?  ? ?DVT prophylaxis:SCDs Start: 05/22/21 0029 ? ? ?  Code Status: Full Code ? ?Family Communication: None at bedside ? ?Patient status:Inpatient ? ?Patient is from :Home ? ?Anticipated discharge ZM:OQHU ? ?Estimated DC date:Not sure at this point ? ? ?Consultants: Neurosurgery, [place] ? ?Procedures: Plan for I&D ? ?Antimicrobials:  ?Anti-infectives (From admission, onward)  ? ? Start     Dose/Rate Route Frequency Ordered Stop  ? 05/22/21 0400  vancomycin (VANCOREADY) IVPB 750 mg/150 mL       ? 750 mg ?150 mL/hr over 60 Minutes Intravenous Every 12 hours 05/21/21 1625    ? 05/21/21 1530  vancomycin (VANCOREADY) IVPB 1750 mg/350 mL       ? 1,750 mg ?175 mL/hr over 120 Minutes Intravenous  Once 05/21/21 1451 05/21/21 1839  ? 05/21/21 1445  vancomycin (VANCOCIN) IVPB 1000 mg/200 mL premix  Status:  Discontinued       ? 1,000 mg ?200 mL/hr over 60 Minutes Intravenous  Once 05/21/21 1443 05/21/21 1451  ? 05/21/21 1445  cefTRIAXone (ROCEPHIN) 2 g in sodium chloride 0.9 % 100 mL IVPB       ? 2 g ?200 mL/hr over 30 Minutes Intravenous  Once 05/21/21 1443 05/21/21 1556  ? ?  ? ? ?Subjective: ? ?Patient seen and examined at the bedside this morning.  Hemodynamically stable and comfortable without any apparent distress.  Complains of drainage and discomfort on the surgical wound on the back. ?Objective: ?Vitals:  ?  05/22/21 0005 05/22/21 0300 05/22/21 0806 05/22/21 0841  ?BP: 125/79 120/78 (!) 109/50 121/87  ?Pulse: 71 75 87   ?Resp: '12 19 14   '$ ?Temp: 97.7 ?F (36.5 ?C) 98.4 ?F (36.9 ?C) 98.2 ?F (36.8 ?C)   ?TempSrc: Oral Oral Oral   ?SpO2: 94% 96% 95%   ?Weight: 89.7 kg     ?Height: '5\' 5"'$  (1.651 m)      ? ? ?Intake/Output Summary (Last 24 hours) at 05/22/2021 1137 ?Last data filed at 05/22/2021 1027 ?Gross per 24 hour  ?Intake 2086.81 ml  ?Output 2930 ml  ?Net -843.19 ml  ? ?Filed Weights  ? 05/21/21 1232 05/22/21 0005  ?Weight: 90.3 kg 89.7 kg  ? ? ?Examination: ? ?General exam: Overall comfortable, not in distress,obese ?HEENT: PERRL ?Respiratory system:  no wheezes or crackles  ?Cardiovascular system: S1 & S2 heard, RRR.  ?Gastrointestinal system: Abdomen is nondistended, soft and nontender. ?Central nervous system: Alert and oriented ?Extremities: No edema, no clubbing ,no cyanosis ?MSK: Surgical wound on the back with erythema ,scant drainage ?Skin: No rashes, no ulcers,no icterus   ? ? ?Data Reviewed: I have personally reviewed following labs and imaging studies ? ?CBC: ?Recent Labs  ?Lab 05/21/21 ?1440 05/22/21 ?0353  ?WBC 5.9 5.1  ?NEUTROABS 3.1  --   ?HGB 13.5 11.2*  ?HCT 38.9* 31.1*  ?MCV 90.3 88.1  ?PLT 185 144*  ? ?Basic Metabolic Panel: ?Recent Labs  ?Lab 05/21/21 ?1440 05/22/21 ?0353  ?NA 132* 136  ?K 4.0 3.7  ?CL 94* 101  ?CO2 25 25  ?GLUCOSE 119* 98  ?BUN 14 8  ?CREATININE 0.76 0.71  ?CALCIUM 9.2 8.7*  ? ? ? ?Recent Results (from the past 240 hour(s))  ?Resp Panel by RT-PCR (Flu A&B, Covid) Nasopharyngeal Swab     Status: None  ? Collection Time: 05/21/21  2:40 PM  ? Specimen: Nasopharyngeal Swab; Nasopharyngeal(NP) swabs in vial transport medium  ?Result Value Ref Range Status  ? SARS Coronavirus 2 by RT PCR NEGATIVE NEGATIVE Final  ?  Comment: (NOTE) ?SARS-CoV-2 target nucleic acids are NOT DETECTED. ? ?The SARS-CoV-2 RNA is generally detectable in upper respiratory ?specimens during the acute phase of infection. The lowest ?concentration of SARS-CoV-2 viral copies this assay can detect is ?138 copies/mL. A negative result does not preclude SARS-Cov-2 ?infection and should not be used as the sole basis for treatment or ?other patient management decisions. A negative result may occur with  ?improper  specimen collection/handling, submission of specimen other ?than nasopharyngeal swab, presence of viral mutation(s) within the ?areas targeted by this assay, and inadequate number of viral ?copies(<138 copies/mL). A negative result must be combined with ?clinical observations, patient history, and epidemiological ?information. The expected result is Negative. ? ?Fact Sheet for Patients:  ?EntrepreneurPulse.com.au ? ?Fact Sheet for Healthcare Providers:  ?IncredibleEmployment.be ? ?This test is no t yet approved or cleared by the [place] FDA and  ?has been authorized for detection and/or diagnosis of SARS-CoV-2 by ?FDA under an Emergency Use Authorization (EUA). This EUA will remain  ?in effect (meaning this test can be used) for the duration of the ?COVID-19 declaration under Section 564(b)(1) of the Act, 21 ?U.S.C.section 360bbb-3(b)(1), unless the authorization is terminated  ?or revoked sooner.  ? ? ?  ? Influenza A by PCR NEGATIVE NEGATIVE Final  ? Influenza B by PCR NEGATIVE NEGATIVE Final  ?  Comment: (NOTE) ?The Xpert Xpress SARS-CoV-2/FLU/RSV plus assay is intended as an aid ?in the diagnosis of influenza from Nasopharyngeal  swab specimens and ?should not be used as a sole basis for treatment. Nasal washings and ?aspirates are unacceptable for Xpert Xpress SARS-CoV-2/FLU/RSV ?testing. ? ?Fact Sheet for Patients: ?EntrepreneurPulse.com.au ? ?Fact Sheet for Healthcare Providers: ?IncredibleEmployment.be ? ?This test is not yet approved or cleared by the [place] FDA and ?has been authorized for detection and/or diagnosis of SARS-CoV-2 by ?FDA under an Emergency Use Authorization (EUA). This EUA will remain ?in effect (meaning this test can be used) for the duration of the ?COVID-19 declaration under Section 564(b)(1) of the Act, 21 U.S.C. ?section 360bbb-3(b)(1), unless the authorization is terminated or ?revoked. ? ?Performed at  [facility], [address], [place], [place] [address] ?  ?Blood Culture (routine x 2)     Status: None (Preliminary result)  ? Collection Time: 05/21/21  2:40 PM  ? Specimen: BLOOD  ?Result Value Ref Loyal Buba

## 2021-05-22 NOTE — Assessment & Plan Note (Addendum)
Presented with erythema, drainage, foul smell from the surgical wound on the upper back.  Continue vancomycin..  Follow-up blood cultures, aerobic/anaerobic cultures.  Neurosurgery following.  Status post I&D of lumbar wound, hardware removal by neurosurgery on 3/15 ?Aerobic/anaerobic culture did not show any organism.  ID changed the antibiotic to cefadroxil, given a dose of oritavancin. ?PT/OT consulted ?

## 2021-05-22 NOTE — Progress Notes (Signed)
Pt came to short stay with partial lowers and upper dentures. [id number] RN came to pick them up to take them back to pt room.  ?

## 2021-05-22 NOTE — Assessment & Plan Note (Signed)
Neurosurgery following, continue antibiotics ?

## 2021-05-22 NOTE — Assessment & Plan Note (Signed)
Monitor blood pressure.  Continue Coreg, Cardizem, lisinopril, Toprol ?

## 2021-05-22 NOTE — Anesthesia Preprocedure Evaluation (Addendum)
Anesthesia Evaluation  ?Patient identified by MRN, date of birth, ID band ?Patient awake ? ? ? ?Reviewed: ?Allergy & Precautions, NPO status , Patient's Chart, lab work & pertinent test results ? ?Airway ?Mallampati: II ? ?TM Distance: >3 FB ?Neck ROM: Full ? ? ? Dental ? ?(+) Edentulous Upper, Missing ?  ?Pulmonary ?former smoker,  ?  ?Pulmonary exam normal ?breath sounds clear to auscultation ? ? ? ? ? ? Cardiovascular ?hypertension, Pt. on medications and Pt. on home beta blockers ?Normal cardiovascular exam ?Rhythm:Regular Rate:Normal ? ?ECG: SR, rate 98 ? ?ECHO: ?Left ventricular ejection fraction, by estimation, is 60 to 65%. The left ventricle has normal function. The left ventricle has no regional wall motion abnormalities. ?Right ventricular systolic function is normal. The right ventricular size is normal. ?No left atrial/left atrial appendage thrombus was detected. ?The mitral valve is normal in structure. Trivial mitral valve regurgitation. No evidence of mitral stenosis. ?The aortic valve is normal in structure. Aortic valve regurgitation is not visualized. No aortic stenosis is present. ?The inferior vena cava is normal in size with greater than 50% respiratory variability, suggesting right atrial pressure of 3 mmHg. ?  ?Neuro/Psych ?Anxiety Myotonia congenita ? Neuromuscular disease   ? GI/Hepatic ?Neg liver ROS, GERD  Medicated and Controlled,  ?Endo/Other  ?diabetes, Insulin Dependent, Oral Hypoglycemic Agents ? Renal/GU ?negative Renal ROS  ? ?  ?Musculoskeletal ? ?(+) Arthritis ,  ? Abdominal ?(+) + obese,   ?Peds ? Hematology ? ?(+) Blood dyscrasia, anemia ,   ?Anesthesia Other Findings ?wound infection ? Reproductive/Obstetrics ? ?  ? ? ? ? ? ? ? ? ? ? ? ? ? ?  ?  ? ? ? ? ? ? ? ?Anesthesia Physical ?Anesthesia Plan ? ?ASA: 3 ? ?Anesthesia Plan: General  ? ?Post-op Pain Management:   ? ?Induction: Intravenous ? ?PONV Risk Score and Plan: 2 and Ondansetron,  Dexamethasone, Midazolam and Treatment may vary due to age or medical condition ? ?Airway Management Planned: Oral ETT ? ?Additional Equipment:  ? ?Intra-op Plan:  ? ?Post-operative Plan: Extubation in OR ? ?Informed Consent: I have reviewed the patients History and Physical, chart, labs and discussed the procedure including the risks, benefits and alternatives for the proposed anesthesia with the patient or authorized representative who has indicated his/her understanding and acceptance.  ? ? ? ?Dental advisory given ? ?Plan Discussed with: CRNA ? ?Anesthesia Plan Comments:   ? ? ? ? ? ? ?Anesthesia Quick Evaluation ? ?

## 2021-05-22 NOTE — Assessment & Plan Note (Signed)
Continue Protonix °

## 2021-05-22 NOTE — Assessment & Plan Note (Addendum)
History of MSSA bacteremia.  New blood cultures have been pending, no growth till date.  No evidence of sepsis at this time.  No leukocytosis or lactic acidosis.  Was on vancomycin, now changed to cefadroxil.  Infectious disease consulted and following ?

## 2021-05-22 NOTE — Progress Notes (Signed)
Patient ID: Danny Hampton, adult   DOB: [birth year], 66 y.o.   MRN: [id number] ?CT scans extensively which are stable there is still evidence of loose hardware with the screws at T10 and T11 the T12 and L1 screws look solid also looks like there is partial posterior lateral bone fusion going on and progressing there.  Patient remains neurologically stable and intact.  So we have recommended I&D of lumbar wound with cutting the rod and removal of the loose hardware I think that is contributing to the poor healing and treatment of the infection and will facilitate more quickly resolution of that infection.  I extensive gone over the risks and benefits of that operation with him as well as perioperative course expectations of outcome and alternatives of surgery and he understands and agrees to proceed forward. ?

## 2021-05-22 NOTE — Consult Note (Signed)
? ? ? ?[facility] for Infectious Disease   ? ?Date of Admission:  05/21/2021    ?    ?      ?Reason for Consult: Postoperative Wound Infection   ?Referring Provider: Dr. Tawanna Solo ?Primary Care Provider: System, Provider Not In ? ? ?ASSESSMENT: ? ?Danny Hampton is a 66 y/o male admitted with worsening MSSA postoperative wound infection with plans for I&D and cultures with possible wound VAC placement. Suspect change in antibiotics from 1 gram Cefadroxil bid to 1 gram Keflex bid may have left some susceptibility for infection secondary to drug kinetics.Will continue with vancomycin for now.  Await surgical specimens, blood culture and wound culture results. Monitor renal function and vancomycin levels for therapeutic drug monitoring. Remaining medical and supportive care per primary team.  ? ?PLAN: ? ?Continue vancomycin.  ?Monitor renal function and vancomycin levels for therapeutic drug monitoring.  ?Monitor cultures  ?I&D today per Neurosurgery ?Remaining medical and supportive care per primary team.  ? ? ?Principal Problem: ?  Postoperative wound infection ?Active Problems: ?  GERD (gastroesophageal reflux disease) ?  Hyperglycemia due to diabetes mellitus ([place]) ?  Myotonia congenita ?  Primary hypertension ?  Hyperlipidemia ?  MSSA bacteremia ?  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 ? ? aspirin  81 mg Oral Daily  ? atorvastatin  40 mg Oral Daily  ? Carbidopa-Levodopa ER  2 tablet Oral BID  ? insulin aspart  0-15 Units Subcutaneous TID WC  ? insulin aspart  4 Units Subcutaneous TID WC  ? lisinopril  10 mg Oral Daily  ? loratadine  10 mg Oral Daily  ? LORazepam  1 mg Oral TID  ? metFORMIN  500 mg Oral BID WC  ? metoprolol succinate  25 mg Oral Daily  ? pantoprazole  40 mg Oral Daily  ? PARoxetine  10 mg Oral BID  ? phenytoin  100 mg Oral TID  ? rOPINIRole  0.5 mg Oral Q24H  ? And  ? rOPINIRole  1 mg Oral QHS  ? ? ? ?HPI: Danny Hampton is a 66 y.o. 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 ? ?Danny Hampton was seen by Dr. [name] on 04/11/21 with history of spinal surgery in August 9622 complicated by surgical site infection and MSSA bacteremia. No evidence of endocarditis on TEE. Was unable to tolerate rifampin and was treated with Cefazolin for 6 weeks and transitioned to high dose Cefadroxil 1 gram twice daily. During clinical visit on 2/2 was noted to have worsening drainage. Continued on Cefadroxil at the time. Subsequently seen by the ID Service at the [facility] and antibiotics were changed to Keflex 1 gram twice daily.  ? ?Danny Hampton was doing well until about 2 days prior to presentation when he developed a rash, fever, redness, tenderness, and malodorous purulent drainage from the site. On arrival was tachycardic but afebrile. Chest x-ray with no significant findings. Afebrile with no leukocytosis since admission. Neurosurgery planning for CT imaging and I&D. Has received vancomycin and ceftriaxone. Blood and wound cultures are pending and narrowed to vancomycin.  ? ? ?Review of Systems: ?Review of Systems  ?Constitutional:  Negative for chills, fever and weight loss.  ?Respiratory:  Negative for cough, shortness of breath and wheezing.   ?Cardiovascular:  Negative for chest pain and leg swelling.  ?Gastrointestinal:  Negative for abdominal pain, constipation,  diarrhea, nausea and vomiting.  ?Skin:  Negative for rash.  ? ? ?Past Medical History:  ?Diagnosis Date  ? Acid reflux   ? Anxiety   ? Arrhythmia   ? paroxysmal supreventricular tachycardia  ? Arthritis   ? Complication of anesthesia   ? Myotonia congenita  ? Dysrhythmia   ? Essential hypertension   ? Family history of adverse reaction to anesthesia   ? Sister  ? Hardware complicating wound infection ([place]) 04/11/2021  ? High cholesterol   ? History of skin  cancer   ? Myotonia congenita   ? Type 2 diabetes mellitus ([place])   ? ? ?Social History  ? ?Tobacco Use  ? Smoking status: Former  ?  Packs/day: 1.00  ?  Years: 30.00  ?  Pack years: 30.00  ?  Types: Cigarettes  ?  Quit date: 03/2018  ?  Years since quitting: 3.2  ? Smokeless tobacco: Never  ?Vaping Use  ? Vaping Use: Never used  ?Substance Use Topics  ? Alcohol use: Not Currently  ?  Comment: occasional drink  ? Drug use: No  ? ? ?Family History  ?Problem Relation Age of Onset  ? Healthy Mother   ? Healthy Father   ? Cancer Father   ? ? ?Allergies  ?Allergen Reactions  ? Gabapentin Anxiety  ? ? ?OBJECTIVE: ?Blood pressure 121/87, pulse 87, temperature 98.2 ?F (36.8 ?C), temperature source Oral, resp. rate 14, height '5\' 5"'$  (1.651 m), weight 89.7 kg, SpO2 95 %. ? ?Physical Exam ?Constitutional:   ?   General: He is not in acute distress. ?   Appearance: He is well-developed.  ?Cardiovascular:  ?   Rate and Rhythm: Normal rate and regular rhythm.  ?   Heart sounds: Normal heart sounds.  ?Pulmonary:  ?   Effort: Pulmonary effort is normal.  ?   Breath sounds: Normal breath sounds.  ?Musculoskeletal:  ?   Comments: Previous surgical incision appears with small open wound in middle. No drainage able to be elicited.   ?Skin: ?   General: Skin is warm and dry.  ?Neurological:  ?   Mental Status: He is alert and oriented to person, place, and time.  ?Psychiatric:     ?   Behavior: Behavior normal.     ?   Thought Content: Thought content normal.     ?   Judgment: Judgment normal.  ? ? ?Lab Results ?Lab Results  ?Component Value Date  ? WBC 5.1 05/22/2021  ? HGB 11.2 (L) 05/22/2021  ? HCT 31.1 (L) 05/22/2021  ? MCV 88.1 05/22/2021  ? PLT 144 (L) 05/22/2021  ?  ?Lab Results  ?Component Value Date  ? CREATININE 0.71 05/22/2021  ? BUN 8 05/22/2021  ? NA 136 05/22/2021  ? K 3.7 05/22/2021  ? CL 101 05/22/2021  ? CO2 25 05/22/2021  ?  ?Lab Results  ?Component Value Date  ? ALT 11 05/21/2021  ? AST 27 05/21/2021  ? ALKPHOS 80  05/21/2021  ? BILITOT 0.8 05/21/2021  ?  ? ?Microbiology: ?Recent Results (from the past 240 hour(s))  ?Resp Panel by RT-PCR (Flu A&B, Covid) Nasopharyngeal Swab     Status: None  ? Collection Time: 05/21/21  2:40 PM  ? Specimen: Nasopharyngeal Swab; Nasopharyngeal(NP) swabs in vial transport medium  ?Result Value Ref Range Status  ? SARS Coronavirus 2 by RT PCR NEGATIVE NEGATIVE Final  ?  Comment: (NOTE) ?SARS-CoV-2 target nucleic acids are NOT DETECTED. ? ?The SARS-CoV-2 RNA is  generally detectable in upper respiratory ?specimens during the acute phase of infection. The lowest ?concentration of SARS-CoV-2 viral copies this assay can detect is ?138 copies/mL. A negative result does not preclude SARS-Cov-2 ?infection and should not be used as the sole basis for treatment or ?other patient management decisions. A negative result may occur with  ?improper specimen collection/handling, submission of specimen other ?than nasopharyngeal swab, presence of viral mutation(s) within the ?areas targeted by this assay, and inadequate number of viral ?copies(<138 copies/mL). A negative result must be combined with ?clinical observations, patient history, and epidemiological ?information. The expected result is Negative. ? ?Fact Sheet for Patients:  ?EntrepreneurPulse.com.au ? ?Fact Sheet for Healthcare Providers:  ?IncredibleEmployment.be ? ?This test is no t yet approved or cleared by the [place] FDA and  ?has been authorized for detection and/or diagnosis of SARS-CoV-2 by ?FDA under an Emergency Use Authorization (EUA). This EUA will remain  ?in effect (meaning this test can be used) for the duration of the ?COVID-19 declaration under Section 564(b)(1) of the Act, 21 ?U.S.C.section 360bbb-3(b)(1), unless the authorization is terminated  ?or revoked sooner.  ? ? ?  ? Influenza A by PCR NEGATIVE NEGATIVE Final  ? Influenza B by PCR NEGATIVE NEGATIVE Final  ?  Comment: (NOTE) ?The Xpert  Xpress SARS-CoV-2/FLU/RSV plus assay is intended as an aid ?in the diagnosis of influenza from Nasopharyngeal swab specimens and ?should not be used as a sole basis for treatment. Nasal washings and ?aspirates are

## 2021-05-22 NOTE — Anesthesia Procedure Notes (Signed)
Procedure Name: Intubation ?Date/Time: 05/22/2021 4:56 PM ?Performed by: Dorann Lodge, CRNA ?Pre-anesthesia Checklist: Patient identified, Emergency Drugs available, Suction available and Patient being monitored ?Patient Re-evaluated:Patient Re-evaluated prior to induction ?Oxygen Delivery Method: Circle System Utilized ?Preoxygenation: Pre-oxygenation with 100% oxygen ?Induction Type: IV induction ?Ventilation: Mask ventilation without difficulty and Oral airway inserted - appropriate to patient size ?Laryngoscope Size: Mac and 4 ?Grade View: Grade I ?Tube type: Oral ?Tube size: 7.5 mm ?Number of attempts: 1 ?Airway Equipment and Method: Stylet and Oral airway ?Placement Confirmation: ETT inserted through vocal cords under direct vision, positive ETCO2 and breath sounds checked- equal and bilateral ?Secured at: 23 cm ?Tube secured with: Tape ?Dental Injury: Teeth and Oropharynx as per pre-operative assessment  ? ? ? ? ?

## 2021-05-22 NOTE — Transfer of Care (Signed)
Immediate Anesthesia Transfer of Care Note ? ?Patient: Danny Hampton ? ?Procedure(s) Performed: LUMBAR WOUND DEBRIDEMENT ? ?Patient Location: PACU ? ?Anesthesia Type:General ? ?Level of Consciousness: awake and alert  ? ?Airway & Oxygen Therapy: Patient Spontanous Breathing and Patient connected to face mask oxygen ? ?Post-op Assessment: Report given to RN and Post -op Vital signs reviewed and stable ? ?Post vital signs: Reviewed and stable ? ?Last Vitals:  ?Vitals Value Taken Time  ?BP 103/73 05/22/21 1815  ?Temp 36.9 ?C 05/22/21 1815  ?Pulse 82 05/22/21 1817  ?Resp 11 05/22/21 1817  ?SpO2 100 % 05/22/21 1817  ?Vitals shown include unvalidated device data. ? ?Last Pain:  ?Vitals:  ? 05/22/21 1815  ?TempSrc:   ?PainSc: 0-No pain  ?   ? ?  ? ?Complications: No notable events documented. ?

## 2021-05-23 DIAGNOSIS — B9561 Methicillin susceptible Staphylococcus aureus infection as the cause of diseases classified elsewhere: Secondary | ICD-10-CM

## 2021-05-23 LAB — BASIC METABOLIC PANEL
Anion gap: 12 (ref 5–15)
BUN: 8 mg/dL (ref 8–23)
CO2: 24 mmol/L (ref 22–32)
Calcium: 8.7 mg/dL — ABNORMAL LOW (ref 8.9–10.3)
Chloride: 102 mmol/L (ref 98–111)
Creatinine, Ser: 0.72 mg/dL (ref 0.61–1.24)
GFR, Estimated: >60 mL/min (ref >60)
Glucose, Bld: 94 mg/dL (ref 70–99)
Potassium: 3.9 mmol/L (ref 3.5–5.1)
Sodium: 138 mmol/L (ref 135–145)

## 2021-05-23 LAB — URINE CULTURE: Culture: NO GROWTH

## 2021-05-23 LAB — GLUCOSE, CAPILLARY
Glucose-Capillary: 119 mg/dL — ABNORMAL HIGH (ref 70–99)
Glucose-Capillary: 181 mg/dL — ABNORMAL HIGH (ref 70–99)
Glucose-Capillary: 115 mg/dL — ABNORMAL HIGH (ref 70–99)
Glucose-Capillary: 145 mg/dL — ABNORMAL HIGH (ref 70–99)

## 2021-05-23 LAB — HEMOGLOBIN A1C
Hgb A1c MFr Bld: 5.8 % — ABNORMAL HIGH (ref 4.8–5.6)
Mean Plasma Glucose: 120 mg/dL

## 2021-05-23 MED ORDER — ORITAVANCIN DIPHOSPHATE 400 MG IV SOLR
1200.0000 mg | Freq: Once | INTRAVENOUS | Status: AC
  Administered 2021-05-23: 1200 mg via INTRAVENOUS
  Filled 2021-05-23: qty 120

## 2021-05-23 MED ORDER — LINEZOLID 600 MG PO TABS
600.0000 mg | ORAL_TABLET | Freq: Two times a day (BID) | ORAL | Status: DC
  Filled 2021-05-23: qty 1

## 2021-05-23 MED ORDER — CEFADROXIL 500 MG PO CAPS
1000.0000 mg | ORAL_CAPSULE | Freq: Two times a day (BID) | ORAL | Status: DC
  Administered 2021-05-23 – 2021-05-24 (×3): 1000 mg via ORAL
  Filled 2021-05-23 (×5): qty 2

## 2021-05-23 NOTE — Progress Notes (Addendum)
Physical Therapy Evaluation ?Patient Details ?Name: Danny Hampton ?MRN: [id number] ?DOB: 04-07-1955 ?Today's Date: 05/23/2021 ? ?History of Present Illness ? 66 y.o. adult admitted 3/14 to have surgery on  3/15 s/p I&D of thoracolumbar wound with opening up the superior aspect of thoracic incision identification of the hardware removal of bilateral T10, T11 screws with cut the rod below T11with placement of drain.  PMH: hypertension, hyperlipidemia, hyperglycemia, myotonia Cogentis  Who has had multiple surgeries on his spine including most recently in August 2022.  This surgery which was a fixation of C16-L8 was complicated by postoperative wound infection requiring debridement.  ?Clinical Impression ? Pt admitted with above diagnosis. Pt was able to ambulate with device with Modif I and good safety.  No cues needed.  Pt needed min guard assist when not using RW. Pt educated on back precaution handout and follows precautions well. Will follow acutely and work to progress pt to ambulation without device.  Pt currently with functional limitations due to the deficits listed below (see PT Problem List). Pt will benefit from skilled PT to increase their independence and safety with mobility to allow discharge to the venue listed below.      ?   ? ?Recommendations for follow up therapy are one component of a multi-disciplinary discharge planning process, led by the attending physician.  Recommendations may be updated based on patient status, additional functional criteria and insurance authorization. ? ?Follow Up Recommendations Outpatient PT ? ?  ?Assistance Recommended at Discharge Intermittent Supervision/Assistance  ?Patient can return home with the following ? A little help with walking and/or transfers ? ?  ?Equipment Recommendations None recommended by PT  ?Recommendations for Other Services ?    ?  ?Functional Status Assessment Patient has had a recent decline in their functional status and demonstrates the ability  to make significant improvements in function in a reasonable and predictable amount of time.  ? ?  ?Precautions / Restrictions Precautions ?Precautions: Back ?Precaution Booklet Issued: Yes (comment) ?Restrictions ?Weight Bearing Restrictions: No  ? ?No Brace per MD.    ? ?Mobility ? Bed Mobility ?Overal bed mobility: Modified Independent ?  ?  ?  ?  ?  ?  ?  ?  ? ?Transfers ?Overall transfer level: Modified independent ?Equipment used: Rolling walker (2 wheels) ?  ?  ?  ?  ?  ?  ?  ?  ?  ? ?Ambulation/Gait ?Ambulation/Gait assistance: Modified independent (Device/Increase time), Min guard ?Gait Distance (Feet): 450 Feet ?Assistive device: Rolling walker (2 wheels), None ?Gait Pattern/deviations: Step-through pattern, Decreased stride length ?  ?Gait velocity interpretation: <1.8 ft/sec, indicate of risk for recurrent falls ?  ?General Gait Details: Pt ambulates well with RW with excellent safety awareness.  Did ambulate pt without device as he did not use one PTA at all times and he needs min guard assist for ambulation without device.  Pt needs assist for balance wihout the RW. ? ?Stairs ?  ?  ?  ?  ?  ? ?Wheelchair Mobility ?  ? ?Modified Rankin (Stroke Patients Only) ?  ? ?  ? ?Balance Overall balance assessment: Mild deficits observed, not formally tested ?  ?  ?  ?  ?  ?  ?  ?  ?  ?  ?  ?  ?  ?  ?  ?  ?  ?  ?   ? ? ? ?Pertinent Vitals/Pain Pain Assessment ?Pain Assessment: Faces ?Faces Pain Scale: Hurts little more ?Pain Location: mid  back ?Pain Descriptors / Indicators: Tender, Aching ?Pain Intervention(s): Limited activity within patient's tolerance, Monitored during session, Premedicated before session, Repositioned  ? ? ?Home Living Family/patient expects to be discharged to:: Private residence ?Living Arrangements: Spouse/significant other ?Available Help at Discharge: Family;Available 24 hours/day ?Type of Home: House ?Home Access: Stairs to enter ?Entrance Stairs-Rails: None ?Entrance Stairs-Number of  Steps: 1 ?  ?Home Layout: One level ?Home Equipment: Conservation officer, nature (2 wheels);Cane - single point;Grab bars - toilet;Grab bars - tub/shower;Shower seat - built in;BSC/3in1;Wheelchair - manual ?Additional Comments: Pt has had a few falls outside per signifcant other  ?  ?Prior Function Prior Level of Function : Independent/Modified Independent;Driving ?  ?  ?  ?  ?  ?  ?Mobility Comments: used RW in ams when first gets up. ?ADLs Comments: B/D self but can be difficult ?  ? ? ?Hand Dominance  ? Dominant Hand: Right ? ?  ?Extremity/Trunk Assessment  ? Upper Extremity Assessment ?Upper Extremity Assessment: Defer to OT evaluation ?  ? ?Lower Extremity Assessment ?Lower Extremity Assessment: Generalized weakness ?  ? ?Cervical / Trunk Assessment ?Cervical / Trunk Assessment: Back Surgery  ?Communication  ? Communication: No difficulties  ?Cognition Arousal/Alertness: Awake/alert ?Behavior During Therapy: [facility] for tasks assessed/performed ?Overall Cognitive Status: Within Functional Limits for tasks assessed ?  ?  ?  ?  ?  ?  ?  ?  ?  ?  ?  ?  ?  ?  ?  ?  ?  ?  ?  ? ?  ?General Comments General comments (skin integrity, edema, etc.): Drain lumbar area ? ?  ?Exercises    ? ?Assessment/Plan  ?  ?PT Assessment Patient needs continued PT services  ?PT Problem List Decreased activity tolerance;Decreased balance;Decreased mobility;Decreased knowledge of use of DME;Decreased safety awareness;Decreased knowledge of precautions ? ?   ?  ?PT Treatment Interventions DME instruction;Gait training;Functional mobility training;Therapeutic activities;Therapeutic exercise;Balance training;Patient/family education   ? ?PT Goals (Current goals can be found in the Care Plan section)  ?Acute Rehab PT Goals ?Patient Stated Goal: [place] home ?PT Goal Formulation: With patient ?Time For Goal Achievement: 06/06/21 ?Potential to Achieve Goals: Good ? ?  ?Frequency Min 5X/week ?  ? ? ?Co-evaluation   ?  ?  ?  ?  ? ? ?  ?AM-PAC PT "6 Clicks" Mobility   ?Outcome Measure Help needed turning from your back to your side while in a flat bed without using bedrails?: None ?Help needed moving from lying on your back to sitting on the side of a flat bed without using bedrails?: None ?Help needed moving to and from a bed to a chair (including a wheelchair)?: None ?Help needed standing up from a chair using your arms (e.g., wheelchair or bedside chair)?: None ?Help needed to walk in hospital room?: A Little ?Help needed climbing 3-5 steps with a railing? : A Little ?6 Click Score: 22 ? ?  ?End of Session Equipment Utilized During Treatment: Gait belt ?Activity Tolerance: Patient tolerated treatment well ?Patient left: in chair;with call bell/phone within reach;with chair alarm set;with family/visitor present ?Nurse Communication: Mobility status ?PT Visit Diagnosis: Muscle weakness (generalized) (M62.81) ?  ? ?Time: 9509-3267 ?PT Time Calculation (min) (ACUTE ONLY): 26 min ? ? ?Charges:   PT Evaluation ?$PT Eval Moderate Complexity: 1 Mod ?PT Treatments ?$Gait Training: 8-22 mins ?  ?   ? ? ?Tobie Hellen M,PT ?Acute Rehab Services ?[phone] ?[phone] (pager)  ? ?Lariyah Shetterly F Careena Degraffenreid ?05/23/2021, 1:22 PM ?

## 2021-05-23 NOTE — Progress Notes (Signed)
?PROGRESS NOTE ? ?Danny Hampton  KGM:[id number] DOB: 1955-11-03 DOA: 05/21/2021 ?PCP: System, Provider Not In  ? ?Brief Narrative: ?Patient is a 66 year old male with history of hypertension, hyperlipidemia, myotonia congentis, multiple surgeries on the spine most recently in August 2022 with fixation of U44-I3 complicated by postoperative wound infection requiring debridement, sepsis, MSSA bacteremia taking Keflex at home presented to the emergency department with complaints of purulent drainage with order from the surgical wound.  CT scan had showed lucency along both T10 transpedicular screws which may indicate loosening.  There was report of purulent drainage with order, lies, generalized weakness,increased erythema and induration around the wound.  Patient was suggested by neurosurgery to come to the hospital.  Patient has been started on  vancomycin .   ID consulted.  Status post I&D of upper lumbar wound,hardware removal by neurosurgery on 3/15 ? ? ?Assessment & Plan: ? ?Principal Problem: ?  Postoperative wound infection ?Active Problems: ?  MSSA bacteremia ?  GERD (gastroesophageal reflux disease) ?  Hyperglycemia due to diabetes mellitus ([name]) ?  Myotonia congenita ?  Primary hypertension ?  Hyperlipidemia ?  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  Wound infection after surgery ? ? ?Assessment and Plan: ?* Postoperative wound infection ?Presented with erythema, drainage, foul smell from the surgical wound on the upper back.  Continue vancomycin..  Follow-up blood cultures, aerobic/anaerobic cultures.  Neurosurgery following.  Status post I&D of lumbar wound, hardware removal by neurosurgery on 3/15 ?Aerobic/anaerobic culture did not show any organism.  ID changed the antibiotic to cefadroxil, given a dose of oritavancin. ?PT/OT consulted ? ?MSSA bacteremia ?History of MSSA bacteremia.  New blood cultures have been pending, no growth till date.  No evidence of  sepsis at this time.  No leukocytosis or lactic acidosis.  Was on vancomycin, now changed to cefadroxil.  Infectious disease consulted and following ? 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 ?Hyperlipidemia ?On lipitor ? ?Primary hypertension ?Monitor blood pressure.  Continue Coreg, Cardizem, lisinopril, Toprol ? ?Myotonia congenita ?Take Sinemet, Dilantin, continue ? ?Hyperglycemia due to diabetes mellitus ([name]) ?Takes metformin at home.  Continue sliding scale insulin here.  Monitor blood sugars ? ?GERD (gastroesophageal reflux disease) ?Continue Protonix ? ?Obesity: BMI 32.9 ? ? ?  ? ? ?  ?  ? ?DVT prophylaxis:SCD's Start: 05/22/21 2024 ?SCDs Start: 05/22/21 0029 ? ? ?  Code Status: Full Code ? ?Family Communication: Wife at bedside ? ?Patient status:Inpatient ? ?Patient is from :Home ? ?Anticipated discharge [id number] ? ?Estimated DC date: After neurosurgery clearance ? ?Consultants: Neurosurgery, [place] ? ?Procedures: Plan for I&D ? ?Antimicrobials:  ?Anti-infectives (From admission, onward)  ? ? Start     Dose/Rate Route Frequency Ordered Stop  ? 05/23/21 1200  linezolid (ZYVOX) tablet 600 mg  Status:  Discontinued       ? 600 mg Oral Every 12 hours 05/23/21 0953 05/23/21 1032  ? 05/23/21 1145  Oritavancin Diphosphate (ORBACTIV) 1,200 mg in dextrose 5 % IVPB       ? 1,200 mg ?333.3 mL/hr over 180 Minutes Intravenous Once 05/23/21 1046    ? 05/23/21 1130  cefadroxil (DURICEF) capsule 1,000 mg       ? 1,000 mg Oral 2 times daily 05/23/21 1032 06/06/21 0959  ? 05/22/21 0400  vancomycin (VANCOREADY) IVPB 750 mg/150 mL  Status:  Discontinued       ? 750 mg ?150 mL/hr over  60 Minutes Intravenous Every 12 hours 05/21/21 1625 05/23/21 0953  ? 05/21/21 1530  vancomycin (VANCOREADY) IVPB 1750 mg/350 mL       ? 1,750 mg ?175 mL/hr over 120 Minutes Intravenous  Once 05/21/21 1451 05/21/21 1839  ? 05/21/21 1445  vancomycin (VANCOCIN)  IVPB 1000 mg/200 mL premix  Status:  Discontinued       ? 1,000 mg ?200 mL/hr over 60 Minutes Intravenous  Once 05/21/21 1443 05/21/21 1451  ? 05/21/21 1445  cefTRIAXone (ROCEPHIN) 2 g in sodium chloride 0.9 % 100 mL IVPB       ? 2 g ?200 mL/hr over 30 Minutes Intravenous  Once 05/21/21 1443 05/21/21 1556  ? ?  ? ? ?Subjective: ?Patient seen and examined at the bedside this morning.  Hemodynamically stable.  Sitting on the chair.  Wife at bedside.  Denies new complaints today.  Wound VAC draining sanguinous output from the back wound ? ? ?Objective: ?Vitals:  ? 05/22/21 2024 05/22/21 2323 05/23/21 0306 05/23/21 1102  ?BP: 122/74 117/76 123/77 103/66  ?Pulse: 64 64 62 96  ?Resp:   18 14  ?Temp: 97.8 ?F (36.6 ?C) 98.2 ?F (36.8 ?C) 97.7 ?F (36.5 ?C) 98.2 ?F (36.8 ?C)  ?TempSrc: Oral Oral Oral Oral  ?SpO2: 93% 96% 95% 93%  ?Weight:      ?Height:      ? ? ?Intake/Output Summary (Last 24 hours) at 05/23/2021 1105 ?Last data filed at 05/23/2021 1117 ?Gross per 24 hour  ?Intake 1440 ml  ?Output 1040 ml  ?Net 400 ml  ? ?Filed Weights  ? 05/21/21 1232 05/22/21 0005  ?Weight: 90.3 kg 89.7 kg  ? ? ?Examination: ? ?General exam: Overall comfortable, not in distress ?HEENT: PERRL ?Respiratory system:  no wheezes or crackles  ?Cardiovascular system: S1 & S2 heard, RRR.  ?Gastrointestinal system: Abdomen is nondistended, soft and nontender. ?Central nervous system: Alert and oriented ?Extremities: No edema, no clubbing ,no cyanosis ?MSK: Surgical wound on the back with wound VAC ?Skin: No rashes, no ulcers,no icterus   ? ? ?Data Reviewed: I have personally reviewed following labs and imaging studies ? ?CBC: ?Recent Labs  ?Lab 05/21/21 ?1440 05/22/21 ?0353  ?WBC 5.9 5.1  ?NEUTROABS 3.1  --   ?HGB 13.5 11.2*  ?HCT 38.9* 31.1*  ?MCV 90.3 88.1  ?PLT 185 144*  ? ?Basic Metabolic Panel: ?Recent Labs  ?Lab 05/21/21 ?1440 05/22/21 ?0353 05/23/21 ?0341  ?NA 132* 136 138  ?K 4.0 3.7 3.9  ?CL 94* 101 102  ?CO2 '25 25 24  '$ ?GLUCOSE 119* 98 94   ?BUN '14 8 8  '$ ?CREATININE 0.76 0.71 0.72  ?CALCIUM 9.2 8.7* 8.7*  ? ? ? ?Recent Results (from the past 240 hour(s))  ?Resp Panel by RT-PCR (Flu A&B, Covid) Nasopharyngeal Swab     Status: None  ? Collection Time: 05/21/21  2:40 PM  ? Specimen: Nasopharyngeal Swab; Nasopharyngeal(NP) swabs in vial transport medium  ?Result Value Ref Range Status  ? SARS Coronavirus 2 by RT PCR NEGATIVE NEGATIVE Final  ?  Comment: (NOTE) ?SARS-CoV-2 target nucleic acids are NOT DETECTED. ? ?The SARS-CoV-2 RNA is generally detectable in upper respiratory ?specimens during the acute phase of infection. The lowest ?concentration of SARS-CoV-2 viral copies this assay can detect is ?138 copies/mL. A negative result does not preclude SARS-Cov-2 ?infection and should not be used as the sole basis for treatment or ?other patient management decisions. A negative result may occur with  ?improper specimen collection/handling, submission of specimen  other ?than nasopharyngeal swab, presence of viral mutation(s) within the ?areas targeted by this assay, and inadequate number of viral ?copies(<138 copies/mL). A negative result must be combined with ?clinical observations, patient history, and epidemiological ?information. The expected result is Negative. ? ?Fact Sheet for Patients:  ?EntrepreneurPulse.com.au ? ?Fact Sheet for Healthcare Providers:  ?IncredibleEmployment.be ? ?This test is no t yet approved or cleared by the [place] FDA and  ?has been authorized for detection and/or diagnosis of SARS-CoV-2 by ?FDA under an Emergency Use Authorization (EUA). This EUA will remain  ?in effect (meaning this test can be used) for the duration of the ?COVID-19 declaration under Section 564(b)(1) of the Act, 21 ?U.S.C.section 360bbb-3(b)(1), unless the authorization is terminated  ?or revoked sooner.  ? ? ?  ? Influenza A by PCR NEGATIVE NEGATIVE Final  ? Influenza B by PCR NEGATIVE NEGATIVE Final  ?  Comment:  (NOTE) ?The Xpert Xpress SARS-CoV-2/FLU/RSV plus assay is intended as an aid ?in the diagnosis of influenza from Nasopharyngeal swab specimens and ?should not be used as a sole basis for treatment. Nasal washi

## 2021-05-23 NOTE — Progress Notes (Signed)
Mobility Specialist: Progress Note ? ? 05/23/21 1522  ?Mobility  ?Activity Ambulated with assistance in hallway  ?Level of Assistance Contact guard assist, steadying assist  ?Assistive Device Front wheel walker  ?Distance Ambulated (ft) 600 ft  ?Activity Response Tolerated well  ?$Mobility charge 1 Mobility  ? ?Pt received in bed and agreeable to mobility. C/o back pain they rated 5/10 during mobility. Pt to bed after session with all needs met.  ? ?Harrell Gave Charlotta Lapaglia ?Mobility Specialist ?Mobility Specialist [place]: [phone] ?Mobility Specialist [place]: [phone]  ?

## 2021-05-23 NOTE — Evaluation (Signed)
Occupational Therapy Evaluation ?Patient Details ?Name: Danny Hampton ?MRN: [id number] ?DOB: 06/23/1955 ?Today's Date: 05/23/2021 ? ? ?History of Present Illness 66 y.o. adult with medical history significant of hypertension, hyperlipidemia, hyperglycemia, myotonia Cogentis  Who has had multiple surgeries on his spine including most recently in August 2022.  This surgery which was a fixation of W09-W1 was complicated by postoperative wound infection requiring debridement.  3/15 s/p I&D of thoracolumbar wound with opening up the superior aspect of thoracic incision identification of the hardware removal of bilateral T10, T11 screws with cut the rod below T11with placement of drain.  ? ?Clinical Impression ?  ?Patient admitted for the procedure above.  Patient known to this OT from prior PLIF.  He is close to his baseline with ADL and in room mobility/toileting.  He continues to follow his back precautions, and demonstrates good safety.  No acute OT is identified.  OT recommended continued mobility throughout the day both in the hospital and at home.  Patient has all the DME needed at home.     ?   ? ?Recommendations for follow up therapy are one component of a multi-disciplinary discharge planning process, led by the attending physician.  Recommendations may be updated based on patient status, additional functional criteria and insurance authorization.  ? ?Follow Up Recommendations ? No OT follow up  ?  ?Assistance Recommended at Discharge Intermittent Supervision/Assistance  ?Patient can return home with the following   ? ?  ?Functional Status Assessment ? Patient has had a recent decline in their functional status and demonstrates the ability to make significant improvements in function in a reasonable and predictable amount of time.  ?Equipment Recommendations ? None recommended by OT  ?  ?Recommendations for Other Services   ? ? ?  ?Precautions / Restrictions Precautions ?Precautions: Back ?Precaution Booklet  Issued: No ?Restrictions ?Weight Bearing Restrictions: No  ? ?  ? ?Mobility Bed Mobility ?Overal bed mobility: Modified Independent ?  ?  ?  ?  ?  ?  ?  ?  ? ?Transfers ?Overall transfer level: Modified independent ?Equipment used: Rolling walker (2 wheels) ?  ?  ?  ?  ?  ?  ?  ?  ?  ? ?  ?Balance Overall balance assessment: Mild deficits observed, not formally tested ?  ?  ?  ?  ?  ?  ?  ?  ?  ?  ?  ?  ?  ?  ?  ?  ?  ?  ?   ? ?ADL either performed or assessed with clinical judgement  ? ?ADL Overall ADL's : At baseline ?  ?  ?  ?  ?  ?  ?  ?  ?  ?  ?  ?  ?  ?  ?  ?  ?  ?  ?  ?   ? ? ? ?Vision Patient Visual Report: No change from baseline ?   ?   ?Perception Perception ?Perception: Not tested ?  ?Praxis Praxis ?Praxis: Not tested ?  ? ?Pertinent Vitals/Pain Pain Assessment ?Pain Assessment: Faces ?Faces Pain Scale: Hurts little more ?Pain Location: mid back ?Pain Descriptors / Indicators: Tender, Aching ?Pain Intervention(s): Monitored during session, RN gave pain meds during session  ? ? ? ?Hand Dominance Right ?  ?Extremity/Trunk Assessment Upper Extremity Assessment ?Upper Extremity Assessment: Overall WFL for tasks assessed ?  ?Lower Extremity Assessment ?Lower Extremity Assessment: Defer to PT evaluation ?  ?Cervical / Trunk Assessment ?Cervical / Trunk Assessment:  Back Surgery ?  ?Communication Communication ?Communication: No difficulties ?  ?Cognition Arousal/Alertness: Awake/alert ?Behavior During Therapy: [facility] for tasks assessed/performed ?Overall Cognitive Status: Within Functional Limits for tasks assessed ?  ?  ?  ?  ?  ?  ?  ?  ?  ?  ?  ?  ?  ?  ?  ?  ?  ?  ?  ?General Comments   VSS on RA ? ?  ?Exercises   ?  ?Shoulder Instructions    ? ? ?Home Living Family/patient expects to be discharged to:: Private residence ?Living Arrangements: Spouse/significant other ?Available Help at Discharge: Family;Available 24 hours/day ?Type of Home: House ?Home Access: Stairs to enter ?Entrance Stairs-Number of Steps:  1 ?Entrance Stairs-Rails: None ?Home Layout: One level ?  ?  ?Bathroom Shower/Tub: Walk-in shower ?  ?Bathroom Toilet: Handicapped height ?Bathroom Accessibility: Yes ?  ?Home Equipment: Conservation officer, nature (2 wheels);Cane - single point;Grab bars - toilet;Grab bars - tub/shower;Shower seat - built in;BSC/3in1;Wheelchair - manual ?  ?Additional Comments: Pt has had a few falls outside per signifcant other ?  ? ?  ?Prior Functioning/Environment Prior Level of Function : Independent/Modified Independent;Driving ?  ?  ?  ?  ?  ?  ?Mobility Comments: used RW in ams when first gets up. ?ADLs Comments: B/D self but can be difficult ?  ? ?  ?  ?OT Problem List: Pain ?  ?   ?OT Treatment/Interventions:    ?  ?OT Goals(Current goals can be found in the care plan section) Acute Rehab OT Goals ?Patient Stated Goal: Hopefully going home this weekend ?OT Goal Formulation: With patient ?Time For Goal Achievement: 05/27/21 ?Potential to Achieve Goals: Good  ?OT Frequency:   ?  ? ?Co-evaluation   ?  ?  ?  ?  ? ?  ?AM-PAC OT "6 Clicks" Daily Activity     ?Outcome Measure Help from another person eating meals?: None ?Help from another person taking care of personal grooming?: None ?Help from another person toileting, which includes using toliet, bedpan, or urinal?: None ?Help from another person bathing (including washing, rinsing, drying)?: A Little ?Help from another person to put on and taking off regular upper body clothing?: A Little ?Help from another person to put on and taking off regular lower body clothing?: A Little ?6 Click Score: 21 ?  ?End of Session Equipment Utilized During Treatment: Rolling walker (2 wheels) ?Nurse Communication: Mobility status ? ?Activity Tolerance: Patient tolerated treatment well ?Patient left: in chair;with call bell/phone within reach;with family/visitor present;with nursing/sitter in room ? ?OT Visit Diagnosis: Pain  ?              ?Time: 3149-7026 ?OT Time Calculation (min): 16 min ?Charges:  OT  General Charges ?$OT Visit: 1 Visit ?OT Evaluation ?$OT Eval Moderate Complexity: 1 Mod ? ?05/23/2021 ? ?RP, OTR/L ? ?Acute Rehabilitation Services ? ?Office:  [phone] ? ? ?Wilver Tignor D Monseratt Ledin ?05/23/2021, 12:17 PM ?

## 2021-05-23 NOTE — Progress Notes (Signed)
? ? ?[facility] for Infectious Disease ? ?Date of Admission:  05/21/2021    ? ?Total days of antibiotics 3 ?        ?ASSESSMENT: ? ?Danny Hampton is POD #1 from I&D with hemovac drain placement. No clear evidence of infection noted during surgery and specimens obtained with no organisms seen on gram stain and cultures pending. Discussed plan of care to include Cefadroxil 1 g twice daily and Oritivancin for 1 dose. Will continue to monitor cultures for presence of new organisms with change of antibiotic as appropriate. Wound care and drain management per Neurosurgery. Remaining medical and supportive care per Primary Team.  ? ?PLAN: ? ?Change vancomycin to Cefadroxil 1 gram PO twice daily. ?Ortivancin once. ?Wound and drain management per Neurosurgery. ?Monitor cultures for any organism identification. ?Remaining medical and supportive care per primary team.  ? ? ?Principal Problem: ?  Postoperative wound infection ?Active Problems: ?  GERD (gastroesophageal reflux disease) ?  Hyperglycemia due to diabetes mellitus ([place]) ?  Myotonia congenita ?  Primary hypertension ?  Hyperlipidemia ?  MSSA bacteremia ?  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  Wound infection after surgery ? ? ? aspirin  81 mg Oral Daily  ? atorvastatin  40 mg Oral Daily  ? Carbidopa-Levodopa ER  2 tablet Oral BID  ? cefadroxil  1,000 mg Oral BID  ? insulin aspart  0-15 Units Subcutaneous TID WC  ? insulin aspart  4 Units Subcutaneous TID WC  ? lisinopril  10 mg Oral Daily  ? loratadine  10 mg Oral Daily  ? LORazepam  1 mg Oral TID  ? metFORMIN  500 mg Oral BID WC  ? metoprolol succinate  25 mg Oral Daily  ? pantoprazole  40 mg Oral Daily  ? PARoxetine  10 mg Oral BID  ? phenytoin  100 mg Oral TID  ? rOPINIRole  0.5 mg Oral Q24H  ? And  ? rOPINIRole  1 mg Oral QHS  ? sodium chloride flush  3 mL Intravenous Q12H  ? ? ?SUBJECTIVE: ? ?Afebrile overnight with no acute events. Up in the chair and moving  around.  ? ?Allergies  ?Allergen Reactions  ? Gabapentin Anxiety  ? ? ? ?Review of Systems: ?Review of Systems  ?Constitutional:  Negative for chills, fever and weight loss.  ?Respiratory:  Negative for cough, shortness of breath and wheezing.   ?Cardiovascular:  Negative for chest pain and leg swelling.  ?Gastrointestinal:  Negative for abdominal pain, constipation, diarrhea, nausea and vomiting.  ?Skin:  Negative for rash.  ? ? ? ?OBJECTIVE: ?Vitals:  ? 05/22/21 2024 05/22/21 2323 05/23/21 0306 05/23/21 5456  ?BP: 122/74 117/76 123/77 103/66  ?Pulse: 64 64 62 96  ?Resp:   18 14  ?Temp: 97.8 ?F (36.6 ?C) 98.2 ?F (36.8 ?C) 97.7 ?F (36.5 ?C) 98.2 ?F (36.8 ?C)  ?TempSrc: Oral Oral Oral Oral  ?SpO2: 93% 96% 95% 93%  ?Weight:      ?Height:      ? ?Body mass index is 32.91 kg/m?. ? ?Physical Exam ?Constitutional:   ?   General: He is not in acute distress. ?   Appearance: He is well-developed.  ?Cardiovascular:  ?   Rate and Rhythm: Normal rate and regular rhythm.  ?   Heart sounds: Normal heart sounds.  ?Pulmonary:  ?   Effort: Pulmonary effort is normal.  ?   Breath sounds: Normal breath sounds.  ?Musculoskeletal:  ?  Comments: Hemovac drain with serosanguinous drainage present.   ?Skin: ?   General: Skin is warm and dry.  ?Neurological:  ?   Mental Status: He is alert and oriented to person, place, and time.  ?Psychiatric:     ?   Behavior: Behavior normal.     ?   Thought Content: Thought content normal.     ?   Judgment: Judgment normal.  ? ? ?Lab Results ?Lab Results  ?Component Value Date  ? WBC 5.1 05/22/2021  ? HGB 11.2 (L) 05/22/2021  ? HCT 31.1 (L) 05/22/2021  ? MCV 88.1 05/22/2021  ? PLT 144 (L) 05/22/2021  ?  ?Lab Results  ?Component Value Date  ? CREATININE 0.72 05/23/2021  ? BUN 8 05/23/2021  ? NA 138 05/23/2021  ? K 3.9 05/23/2021  ? CL 102 05/23/2021  ? CO2 24 05/23/2021  ?  ?Lab Results  ?Component Value Date  ? ALT 11 05/21/2021  ? AST 27 05/21/2021  ? ALKPHOS 80 05/21/2021  ? BILITOT 0.8 05/21/2021   ?  ? ?Microbiology: ?Recent Results (from the past 240 hour(s))  ?Resp Panel by RT-PCR (Flu A&B, Covid) Nasopharyngeal Swab     Status: None  ? Collection Time: 05/21/21  2:40 PM  ? Specimen: Nasopharyngeal Swab; Nasopharyngeal(NP) swabs in vial transport medium  ?Result Value Ref Range Status  ? SARS Coronavirus 2 by RT PCR NEGATIVE NEGATIVE Final  ?  Comment: (NOTE) ?SARS-CoV-2 target nucleic acids are NOT DETECTED. ? ?The SARS-CoV-2 RNA is generally detectable in upper respiratory ?specimens during the acute phase of infection. The lowest ?concentration of SARS-CoV-2 viral copies this assay can detect is ?138 copies/mL. A negative result does not preclude SARS-Cov-2 ?infection and should not be used as the sole basis for treatment or ?other patient management decisions. A negative result may occur with  ?improper specimen collection/handling, submission of specimen other ?than nasopharyngeal swab, presence of viral mutation(s) within the ?areas targeted by this assay, and inadequate number of viral ?copies(<138 copies/mL). A negative result must be combined with ?clinical observations, patient history, and epidemiological ?information. The expected result is Negative. ? ?Fact Sheet for Patients:  ?EntrepreneurPulse.com.au ? ?Fact Sheet for Healthcare Providers:  ?IncredibleEmployment.be ? ?This test is no t yet approved or cleared by the [place] FDA and  ?has been authorized for detection and/or diagnosis of SARS-CoV-2 by ?FDA under an Emergency Use Authorization (EUA). This EUA will remain  ?in effect (meaning this test can be used) for the duration of the ?COVID-19 declaration under Section 564(b)(1) of the Act, 21 ?U.S.C.section 360bbb-3(b)(1), unless the authorization is terminated  ?or revoked sooner.  ? ? ?  ? Influenza A by PCR NEGATIVE NEGATIVE Final  ? Influenza B by PCR NEGATIVE NEGATIVE Final  ?  Comment: (NOTE) ?The Xpert Xpress SARS-CoV-2/FLU/RSV plus assay  is intended as an aid ?in the diagnosis of influenza from Nasopharyngeal swab specimens and ?should not be used as a sole basis for treatment. Nasal washings and ?aspirates are unacceptable for Xpert Xpress SARS-CoV-2/FLU/RSV ?testing. ? ?Fact Sheet for Patients: ?EntrepreneurPulse.com.au ? ?Fact Sheet for Healthcare Providers: ?IncredibleEmployment.be ? ?This test is not yet approved or cleared by the [place] FDA and ?has been authorized for detection and/or diagnosis of SARS-CoV-2 by ?FDA under an Emergency Use Authorization (EUA). This EUA will remain ?in effect (meaning this test can be used) for the duration of the ?COVID-19 declaration under Section 564(b)(1) of the Act, 21 U.S.C. ?section 360bbb-3(b)(1), unless the authorization is terminated  or ?revoked. ? ?Performed at [facility], [address] [place], [place] [address] ?  ?Blood Culture (routine x 2)     Status: None (Preliminary result)  ? Collection Time: 05/21/21  2:40 PM  ? Specimen: BLOOD  ?Result Value Ref Range Status  ? Specimen Description BLOOD LEFT ANTECUBITAL  Final  ? Special Requests   Final  ?  BOTTLES DRAWN AEROBIC AND ANAEROBIC Blood Culture adequate volume  ? Culture   Final  ?  NO GROWTH 2 DAYS ?Performed at [facility], [address], [place], [place] [address] ?  ? Report Status PENDING  Incomplete  ?Blood Culture (routine x 2)     Status: None (Preliminary result)  ? Collection Time: 05/21/21  3:23 PM  ? Specimen: BLOOD  ?Result Value Ref Range Status  ? Specimen Description BLOOD RIGHT ANTECUBITAL  Final  ? Special Requests   Final  ?  BOTTLES DRAWN AEROBIC AND ANAEROBIC Blood Culture adequate volume  ? Culture   Final  ?  NO GROWTH 2 DAYS ?Performed at [facility], [address] [place], [place] [address] ?  ? Report Status PENDING  Incomplete  ?Aerobic/Anaerobic Culture w Gram Stain (surgical/deep wound)     Status: None (Preliminary result)  ? Collection Time: 05/21/21  8:39 PM  ?  Specimen: Back; Wound  ?Result Value Ref Range Status  ? Specimen Description   Final  ?  BACK ?Performed at [facility], [address] [place], [place] [address] ?  ? Special Requests   Final  ?  NONE

## 2021-05-23 NOTE — Progress Notes (Signed)
Subjective: ?Patient reports  doing well pain well controlled ? ?Objective: ?Vital signs in last 24 hours: ?Temp:  [97.7 ?F (36.5 ?C)-98.8 ?F (37.1 ?C)] 97.7 ?F (36.5 ?C) (03/16 0306) ?Pulse Rate:  [62-87] 62 (03/16 0306) ?Resp:  [13-22] 18 (03/16 0306) ?BP: (99-151)/(50-90) 123/77 (03/16 0306) ?SpO2:  [92 %-100 %] 95 % (03/16 0306) ? ?Intake/Output from previous day: ?03/15 0701 - 03/16 0700 ?In: 7619 [P.O.:640; I.V.:600; IV Piggyback:150] ?Out: 1940 [Urine:1800; Drains:90; Blood:50] ?Intake/Output this shift: ?No intake/output data recorded. ? ?Incision looks good bandage has 1 small spot of saturation ? ?Lab Results: ?Recent Labs  ?  05/21/21 ?1440 05/22/21 ?0353  ?WBC 5.9 5.1  ?HGB 13.5 11.2*  ?HCT 38.9* 31.1*  ?PLT 185 144*  ? ?BMET ?Recent Labs  ?  05/22/21 ?0353 05/23/21 ?0341  ?NA 136 138  ?K 3.7 3.9  ?CL 101 102  ?CO2 25 24  ?GLUCOSE 98 94  ?BUN 8 8  ?CREATININE 0.71 0.72  ?CALCIUM 8.7* 8.7*  ? ? ?Studies/Results: ?CT THORACIC SPINE WO CONTRAST ? ?Result Date: 05/22/2021 ?CLINICAL DATA:  66 year old male with persistent thoracolumbar back pain. Prior surgery with evidence of lower thoracic hardware loosening by CT in January. EXAM: CT THORACIC SPINE WITHOUT CONTRAST TECHNIQUE: Multidetector CT images of the thoracic were obtained using the standard protocol without intravenous contrast. RADIATION DOSE REDUCTION: This exam was performed according to the departmental dose-optimization program which includes automated exposure control, adjustment of the mA and/or kV according to patient size and/or use of iterative reconstruction technique. COMPARISON:  04/05/2021. FINDINGS: Limited cervical spine imaging: Chronic severe disc space loss at C7-T1 with evidence of developing degenerative interbody ankylosis there. 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 Thoracic spine segmentation: Normal, same numbering system used in January. Alignment: Stable thoracic vertebral height and  alignment, including mild degenerative appearing anterolisthesis of T2 on T3. Vertebrae: Postoperative details are below. No acute osseous abnormality identified. Previous T12 vertebral body augmentation appears stable including a small volume of ventral epidural space bone cement at T12 and T11, and involving the right T11 neural foramen. Paraspinal and other soft tissues: There is increased mild paraspinal soft tissue inflammatory stranding since January at the T10 level. See series 4, image 134. But this is fairly mild and localized. Otherwise stable including postoperative changes to the posterior lower thoracic paraspinal soft tissues. Disc levels: T1-T2 through T8-T9: Unchanged since January. T9-T10: Subtle anterolisthesis. Stable disc space. Posterior disc osteophyte complex and moderate posterior element hypertrophy. Suspected mild spinal stenosis here. T10-T11: Progressed vacuum disc here since January. Chronic loosening of the bilateral T10 and right T11 pedicle screws. No convincing arthrodesis. Degenerative bilateral T10 neural foraminal stenosis, at least moderate and there is a small chronic focus of gas in the left lateral recess near the proximal left T10 foramen suggesting disc herniation. T11-T12: Right T11 pedicle screw loosening is chronic. Mild interbody vacuum phenomena is stable. Stable ventral and right inferior foraminal epidural bone cement. Posterior element hypertrophy with no convincing arthrodesis. T12-L1: Stable vertebral body augmentation with ventral epidural bone cement. No pedicle screw loosening. Decreased but not resolved interbody vacuum phenomena. Chronic disc osteophyte complex eccentric to the right. No convincing arthrodesis. IMPRESSION: 1. Chronic T10 and T11 pedicle screw loosening with no convincing postoperative arthrodesis in the lower thoracic spine through T12-L1. 2. Subtle new paraspinal soft tissue inflammation at the T10 level, but progressed vacuum disc at the  adjacent T10-T11 space since January which argues against discitis. No convincing endplate changes of  osteomyelitis. But if suspicion of spinal infection persists Thoracic MRI here would be most sensitive and could be compared to that on 11/26/2020. 3. Otherwise stable postoperative appearance of the lower thoracic spine including T12 vertebral body augmentation with small volume epidural space bone cement. 4. 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 And chronic severe disc space loss at C7-T1 with evidence of developing degenerative interbody ankylosis. Electronically Signed   By: Genevie Ann M.D.   On: 05/22/2021 11:20  ? ?CT LUMBAR SPINE WO CONTRAST ? ?Result Date: 05/22/2021 ?CLINICAL DATA:  66 year old male with persistent thoracolumbar back pain. Prior surgery with evidence of lower thoracic hardware loosening by CT in January. EXAM: CT LUMBAR SPINE WITHOUT CONTRAST TECHNIQUE: Multidetector CT imaging of the lumbar spine was performed without intravenous contrast administration. Multiplanar CT image reconstructions were also generated. RADIATION DOSE REDUCTION: This exam was performed according to the departmental dose-optimization program which includes automated exposure control, adjustment of the mA and/or kV according to patient size and/or use of iterative reconstruction technique. COMPARISON:  Thoracic spine CT today reported separately. Lumbar CT 04/05/2021. FINDINGS: Segmentation: Normal, concordant with the thoracic spine numbering today and the same numbering used in January. Alignment: Stable lumbar vertebral height and alignment since January. Vertebrae: Postoperative details are below. No acute osseous abnormality identified. Intact visible sacrum and SI joints. Paraspinal and other soft tissues: Stable postoperative lumbar paraspinal soft tissue changes. Aortoiliac calcified atherosclerosis. Negative visible noncontrast abdominal viscera. Partially visible  bladder distension. Disc levels: T12-L1:  Reported with the thoracic spine separately. L1-L2: Previous L1 vertebral body augmentation, posterior decompression. Bilateral pedicle screws without evidence of hardware loosening. But absent arthrodesis and pronounced interbody vacuum phenomena with new 7 mm focus of gas in the left lateral recess (series 4, image 21), descending left L2 nerve level. L2-L3:  Stable chronic fusion with solid appearing arthrodesis. L3-L4: Stable chronic decompression and fusion with solid appearing arthrodesis. L4-L5: Stable chronic decompression and fusion with solid appearing arthrodesis. L5-S1: Stable chronic decompression and fusion with solid appearing posterior element arthrodesis. IMPRESSION: 1. Solid postoperative spinal arthrodesis L2 through S1. 2. Previous decompression and fusion at L1-L2 but chronic pseudoarthrosis with pronounced interbody vacuum phenomena, and evidence of a new left lateral recess 7 mm disc herniation. Query left L2 radiculitis. No hardware loosening in the lumbar spine. 3. See also CT Thoracic Spine reported separately. 4. Aortic Atherosclerosis (ICD10-I70.0). Electronically Signed   By: Genevie Ann M.D.   On: 05/22/2021 11:28  ? ?DG Chest Port 1 View ? ?Result Date: 05/21/2021 ?CLINICAL DATA:  Concern for sepsis EXAM: PORTABLE CHEST 1 VIEW COMPARISON:  11/29/2020 FINDINGS: The heart size and mediastinal contours are within normal limits. Both lungs are clear. The visualized skeletal structures are unremarkable. IMPRESSION: No acute abnormality of the lungs in AP portable projection. Electronically Signed   By: Delanna Ahmadi M.D.   On: 05/21/2021 14:57   ? ?Assessment/Plan: ?Postop day 1 I&D of thoracolumbar wound with very small area of saturation superior aspect of the bandage.  Drain output 90 cc still little bit much to discontinue.  Gram stain and culture still pending patient remains on vancomycin await cultures and appreciate ID recommendations for  tailoring antibiotics mobilize with physical and Occupational Therapy ? LOS: 2 days  ? ? ? ?Elaina Hoops ?05/23/2021, 7:39 AM ? ? ? ? ?

## 2021-05-24 ENCOUNTER — Encounter (HOSPITAL_COMMUNITY): Payer: Self-pay | Admitting: Neurosurgery

## 2021-05-24 LAB — GLUCOSE, CAPILLARY: Glucose-Capillary: 106 mg/dL — ABNORMAL HIGH (ref 70–99)

## 2021-05-24 MED ORDER — CEFADROXIL 500 MG PO CAPS
1000.0000 mg | ORAL_CAPSULE | Freq: Two times a day (BID) | ORAL | 0 refills | Status: DC
Start: 2021-05-24 — End: 2021-06-05

## 2021-05-24 MED ORDER — OXYCODONE HCL 5 MG PO TABS: 5.0000 mg | ORAL_TABLET | Freq: Four times a day (QID) | ORAL | 0 refills | Status: DC | PRN

## 2021-05-24 NOTE — Progress Notes (Addendum)
[facility]  Date of Admission:  05/21/2021     Total days of antibiotics 3         ASSESSMENT:  Danny Hampton is POD #2 from I&D with surgical specimens without growth to date and no organisms seen on gram stain. Neurosurgery removed drain today. Received oritivancin on 05/23/21 and will continue Cefadroxil 1 g PO bid for 2 weeks until 06/05/21 when he has follow up with Dr. Renold Don. Discussed plan of care and answered questions. Follow up appointment made. Continue wound care per Neurosurgery. Ok for discharge from ID standpoint.   PLAN:  Continue Cefadroxil through 06/05/21.  Monitor cultures until finalized Wound care per Neurosurgery. Follow up with Dr. Salem Senate for discharge from ID standpoint.   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, sequela   Wound infection after surgery    aspirin  81 mg Oral Daily   atorvastatin  40 mg Oral Daily   Carbidopa-Levodopa ER  2 tablet Oral BID   cefadroxil  1,000 mg Oral BID   insulin aspart  0-15 Units Subcutaneous TID WC   insulin aspart  4 Units Subcutaneous TID WC   lisinopril  10 mg Oral Daily   loratadine  10 mg Oral Daily   LORazepam  1 mg Oral TID   metFORMIN  500 mg Oral BID WC   metoprolol succinate  25 mg Oral Daily   pantoprazole  40 mg Oral Daily   PARoxetine  10 mg Oral BID   phenytoin  100 mg Oral TID   rOPINIRole  0.5 mg Oral Q24H   And   rOPINIRole  1 mg Oral QHS   sodium chloride flush  3 mL Intravenous Q12H    SUBJECTIVE:  Afebrile overnight with no acute events. Feeling better today. No new concerns/complaints. Questions about antibiotic plan.   Allergies  Allergen Reactions   Gabapentin Anxiety      Review of Systems: Review of Systems  Constitutional:  Negative for chills, fever and weight loss.  Respiratory:  Negative for cough, shortness of breath and wheezing.   Cardiovascular:  Negative for chest pain and leg swelling.  Gastrointestinal:  Negative for abdominal pain, constipation, diarrhea, nausea and vomiting.  Skin:  Negative for rash.     OBJECTIVE: Vitals:   05/23/21 1928 05/23/21 2308 05/24/21 0310 05/24/21 0802  BP: 109/67 101/65 126/84 122/67  Pulse: 69 77 95 92  Resp:    18  Temp: 98.3 F (36.8 C) 98.1 F (36.7 C) 97.9 F (36.6 C) 98 F (36.7 C)  TempSrc: Oral Oral Oral Oral  SpO2: 98% 92% 96% 97%  Weight:      Height:       Body mass index is 32.91 kg/m.  Physical Exam Constitutional:      General: He is not in acute distress.    Appearance: He is well-developed.  Cardiovascular:     Rate and Rhythm: Normal rate and regular rhythm.     Heart sounds: Normal heart sounds.  Pulmonary:     Effort: Pulmonary effort is normal.     Breath sounds: Normal breath sounds.  Skin:    General: Skin is warm and dry.  Neurological:  Mental Status: He is alert.  Psychiatric:        Mood and Affect: Mood normal.    Lab Results Lab Results  Component Value Date   WBC 5.1 05/22/2021   HGB 11.2 (L) 05/22/2021   HCT 31.1 (L) 05/22/2021   MCV 88.1 05/22/2021   PLT 144 (L) 05/22/2021    Lab Results  Component Value Date   CREATININE 0.72 05/23/2021   BUN 8 05/23/2021   NA 138 05/23/2021   K 3.9 05/23/2021   CL 102 05/23/2021   CO2 24 05/23/2021    Lab Results  Component Value Date   ALT 11 05/21/2021   AST 27 05/21/2021   ALKPHOS 80 05/21/2021   BILITOT 0.8 05/21/2021     Microbiology: Recent Results (from the past 240 hour(s))  Resp Panel by RT-PCR (Flu A&B, Covid) Nasopharyngeal Swab     Status: None   Collection Time: 05/21/21  2:40 PM   Specimen: Nasopharyngeal Swab; Nasopharyngeal(NP) swabs in vial transport medium  Result Value  Ref Range Status   SARS Coronavirus 2 by RT PCR NEGATIVE NEGATIVE Final    Comment: (NOTE) SARS-CoV-2 target nucleic acids are NOT DETECTED.  The SARS-CoV-2 RNA is generally detectable in upper respiratory specimens during the acute phase of infection. The lowest concentration of SARS-CoV-2 viral copies this assay can detect is 138 copies/mL. A negative result does not preclude SARS-Cov-2 infection and should not be used as the sole basis for treatment or other patient management decisions. A negative result may occur with  improper specimen collection/handling, submission of specimen other than nasopharyngeal swab, presence of viral mutation(s) within the areas targeted by this assay, and inadequate number of viral copies(<138 copies/mL). A negative result must be combined with clinical observations, patient history, and epidemiological information. The expected result is Negative.  Fact Sheet for Patients:  BloggerCourse.com  Fact Sheet for Healthcare Providers:  SeriousBroker.it  This test is no t yet approved or cleared by the [place] FDA and  has been authorized for detection and/or diagnosis of SARS-CoV-2 by FDA under an Emergency Use Authorization (EUA). This EUA will remain  in effect (meaning this test can be used) for the duration of the COVID-19 declaration under Section 564(b)(1) of the Act, 21 U.S.C.section 360bbb-3(b)(1), unless the authorization is terminated  or revoked sooner.       Influenza A by PCR NEGATIVE NEGATIVE Final   Influenza B by PCR NEGATIVE NEGATIVE Final    Comment: (NOTE) The Xpert Xpress SARS-CoV-2/FLU/RSV plus assay is intended as an aid in the diagnosis of influenza from Nasopharyngeal swab specimens and should not be used as a sole basis for treatment. Nasal washings and aspirates are unacceptable for Xpert Xpress SARS-CoV-2/FLU/RSV testing.  Fact Sheet for  Patients: BloggerCourse.com  Fact Sheet for Healthcare Providers: SeriousBroker.it  This test is not yet approved or cleared by the [place] FDA and has been authorized for detection and/or diagnosis of SARS-CoV-2 by FDA under an Emergency Use Authorization (EUA). This EUA will remain in effect (meaning this test can be used) for the duration of the COVID-19 declaration under Section 564(b)(1) of the Act, 21 U.S.C. section 360bbb-3(b)(1), unless the authorization is terminated or revoked.  Performed at [facility], [address], [place], [place] [address]   Blood Culture (routine x 2)     Status: None (Preliminary result)   Collection Time: 05/21/21  2:40 PM   Specimen: BLOOD  Result Value Ref Range Status   Specimen Description BLOOD  LEFT ANTECUBITAL  Final   Special Requests   Final    BOTTLES DRAWN AEROBIC AND ANAEROBIC Blood Culture adequate volume   Culture   Final    NO GROWTH 3 DAYS Performed at [facility], [address] [place], [place] [address]    Report Status PENDING  Incomplete  Blood Culture (routine x 2)     Status: None (Preliminary result)   Collection Time: 05/21/21  3:23 PM   Specimen: BLOOD  Result Value Ref Range Status   Specimen Description BLOOD RIGHT ANTECUBITAL  Final   Special Requests   Final    BOTTLES DRAWN AEROBIC AND ANAEROBIC Blood Culture adequate volume   Culture   Final    NO GROWTH 3 DAYS Performed at [facility], [address], [place], [place] [address]    Report Status PENDING  Incomplete  Aerobic/Anaerobic Culture w Gram Stain (surgical/deep wound)     Status: None (Preliminary result)   Collection Time: 05/21/21  8:39 PM   Specimen: Back; Wound  Result Value Ref Range Status   Specimen Description   Final    BACK Performed at [facility], [address] [place], [place] [address]    Special Requests   Final    NONE Performed at [facility], [address],  [place], [place] [address]    Gram Stain NO WBC SEEN NO ORGANISMS SEEN   Final   Culture   Final    RARE STAPHYLOCOCCUS HAEMOLYTICUS SUSCEPTIBILITIES TO FOLLOW Performed at [facility], [address] [place], [place] [address]    Report Status PENDING  Incomplete  Urine Culture     Status: None   Collection Time: 05/21/21  9:49 PM   Specimen: Urine, Clean Catch  Result Value Ref Range Status   Specimen Description   Final    URINE, CLEAN CATCH Performed at [facility], [address], [place], [place] [address]    Special Requests   Final    NONE Performed at [facility], [address] [place], [place] [address]    Culture   Final    NO GROWTH Performed at [facility], [address] [place], [place] [address]    Report Status 05/23/2021 FINAL  Final  Aerobic/Anaerobic Culture w Gram Stain (surgical/deep wound)     Status: None (Preliminary result)   Collection Time: 05/22/21  5:20 PM   Specimen: PATH Other; Body Fluid  Result Value Ref Range Status   Specimen Description FLUID  Final   Special Requests SPEC C RIGHT THORACIC TEN PEDICLE  Final   Gram Stain   Final    RARE WBC PRESENT, PREDOMINANTLY PMN NO ORGANISMS SEEN    Culture   Final    NO GROWTH 2 DAYS Performed at [facility], [address] [place], [place] [address]    Report Status PENDING  Incomplete  Aerobic/Anaerobic Culture w Gram Stain (surgical/deep wound)     Status: None (Preliminary result)   Collection Time: 05/22/21  5:39 PM   Specimen: PATH Other; Body Fluid  Result Value Ref Range Status   Specimen Description FLUID  Final   Special Requests SPEC D LEFT THORACIC TEN PEDICLE  Final   Gram Stain   Final    FEW WBC PRESENT,BOTH PMN AND MONONUCLEAR NO ORGANISMS SEEN    Culture   Final    NO GROWTH 2 DAYS NO ANAEROBES ISOLATED; CULTURE IN PROGRESS FOR 5 DAYS Performed at [facility], [address] [place], [place] [address]    Report Status PENDING  Incomplete   Aerobic/Anaerobic Culture w Gram Stain (surgical/deep wound)     Status: None (Preliminary result)   Collection Time: 05/22/21  5:42 PM   Specimen: PATH Other; Body Fluid  Result Value Ref Range Status   Specimen Description WOUND  Final   Special Requests THORACIC WOUND  Final   Gram Stain   Final    RARE WBC PRESENT, PREDOMINANTLY PMN NO ORGANISMS SEEN    Culture   Final    NO GROWTH 2 DAYS NO ANAEROBES ISOLATED; CULTURE IN PROGRESS FOR 5 DAYS Performed at [facility], [address], [place], [place] [address]    Report Status PENDING  Incomplete     Marcos Eke, NP [facility]  05/24/2021  11:33 AM   Patient was seen, examined,treatment plan was discussed with the  Advance Practice Provider.  I have personally reviewed the clinical findings, labs, imaging studies and management of this patient in detail.  I agree with the documentation, as recorded by the Advance Practice Provider.

## 2021-05-24 NOTE — Progress Notes (Addendum)
Physical Therapy Treatment/ discharge ?Patient Details ?Name: Danny Hampton ?MRN: [id number] ?DOB: 05/09/1955 ?Today's Date: 05/24/2021 ? ? ?History of Present Illness 66 y.o. male admitted 3/14 for I&D of thoracolumbar wound with hardware removal t10-11.PMHx: Aug 2022 T10-L3 fixation, HTN, HLD, hyperglycemia, myotonia Cogentis ? ?  ?PT Comments  ? ? Pt very pleasant and able to progress to performing all transfers and gait without assist or DME. Pt able to state precautions and educated for all. Pt will have assist of girlfriend at home and no further therapy needs at this time with pt aware and agreeable. Will sign off with continued gait encouraged.  ?   ?Recommendations for follow up therapy are one component of a multi-disciplinary discharge planning process, led by the attending physician.  Recommendations may be updated based on patient status, additional functional criteria and insurance authorization. ? ?Follow Up Recommendations ? No PT follow up ?  ?  ?Assistance Recommended at Discharge PRN  ?Patient can return home with the following Assistance with cooking/housework ?  ?Equipment Recommendations ? None recommended by PT  ?  ?Recommendations for Other Services   ? ? ?  ?Precautions / Restrictions Precautions ?Precautions: Back ?Precaution Booklet Issued: Yes (comment)  ?  ? ?Mobility ? Bed Mobility ?Overal bed mobility: Modified Independent ?  ?  ?  ?  ?  ?  ?  ?  ? ?Transfers ?Overall transfer level: Modified independent ?  ?  ?  ?  ?  ?  ?  ?  ?  ?  ? ?Ambulation/Gait ?Ambulation/Gait assistance: Modified independent (Device/Increase time) ?Gait Distance (Feet): 600 Feet ?Assistive device: None ?Gait Pattern/deviations: Step-through pattern, Decreased stride length ?  ?Gait velocity interpretation: >2.62 ft/sec, indicative of community ambulatory ?  ?General Gait Details: pt with slow steady gait with good posture and no LOB ? ? ?Stairs ?  ?  ?  ?  ?  ? ? ?Wheelchair Mobility ?  ? ?Modified Rankin  (Stroke Patients Only) ?  ? ? ?  ?Balance Overall balance assessment: No apparent balance deficits (not formally assessed) ?  ?  ?  ?  ?  ?  ?  ?  ?  ?  ?  ?  ?  ?  ?  ?  ?  ?  ?  ? ?  ?Cognition Arousal/Alertness: Awake/alert ?Behavior During Therapy: WFL for tasks assessed/performed ?Overall Cognitive Status: Within Functional Limits for tasks assessed ?  ?  ?  ?  ?  ?  ?  ?  ?  ?  ?  ?  ?  ?  ?  ?  ?  ?  ?  ? ?  ?Exercises   ? ?  ?General Comments   ?  ?  ? ?Pertinent Vitals/Pain Pain Assessment ?Pain Score: 4  ?Pain Location: mid back ?Pain Descriptors / Indicators: Tender, Aching ?Pain Intervention(s): Limited activity within patient's tolerance, Monitored during session, RN gave pain meds during session, Repositioned  ? ? ?Home Living   ?  ?  ?  ?  ?  ?  ?  ?  ?  ?   ?  ?Prior Function    ?  ?  ?   ? ?PT Goals (current goals can now be found in the care plan section) Progress towards PT goals: Goals met/education completed, patient discharged from PT ? ?  ?Frequency ? ? ?   ? ? ? ?  ?PT Plan Current plan remains appropriate  ? ? ?Co-evaluation   ?  ?  ?  ?  ? ?  ?  AM-PAC PT "6 Clicks" Mobility   ?Outcome Measure ? Help needed turning from your back to your side while in a flat bed without using bedrails?: None ?Help needed moving from lying on your back to sitting on the side of a flat bed without using bedrails?: None ?Help needed moving to and from a bed to a chair (including a wheelchair)?: None ?Help needed standing up from a chair using your arms (e.g., wheelchair or bedside chair)?: None ?Help needed to walk in hospital room?: None ?Help needed climbing 3-5 steps with a railing? : None ?6 Click Score: 24 ? ?  ?End of Session   ?Activity Tolerance: Patient tolerated treatment well ?Patient left: in chair;with call bell/phone within reach ?Nurse Communication: Mobility status ?PT Visit Diagnosis: Other abnormalities of gait and mobility (R26.89) ?  ? ? ?Time: 5361-4431 ?PT Time Calculation (min) (ACUTE  ONLY): 20 min ? ?Charges:  $Gait Training: 8-22 mins          ?          ? ?Tyrhonda Georgiades P, PT ?Acute Rehabilitation Services ?Pager: [phone] ?Office: [phone] ? ? ? ?Lamarkus Nebel B Finnegan Gatta ?05/24/2021, 8:39 AM ? ?

## 2021-05-24 NOTE — Progress Notes (Signed)
Mobility Specialist: Progress Note ? ? 05/24/21 1045  ?Mobility  ?Activity Ambulated independently in hallway  ?Level of Assistance Independent  ?Assistive Device None  ?Distance Ambulated (ft) 600 ft  ?Activity Response Tolerated well  ?$Mobility charge 1 Mobility  ? ?Pt received in bed and agreeable to mobility. C/o back pain during mobility, no rating given. Pt to bed after session with all needs met.  ? ?Danny Hampton ?Mobility Specialist ?Mobility Specialist [place]: [phone] ?Mobility Specialist [place]: [phone] ? ?

## 2021-05-24 NOTE — Discharge Summary (Signed)
Physician Discharge Summary  ?Danny Hampton TWS:[id number] DOB: 11-May-1955 DOA: 05/21/2021 ? ?PCP: System, Provider Not In ? ?Admit date: 05/21/2021 ?Discharge date: 05/24/2021 ? ?Admitted From: Home ?Disposition:  Home ? ?Discharge Condition:Stable ?CODE STATUS:FULL ?Diet recommendation: Heart Healthy ? ?Brief/Interim Summary: ? ?Patient is a 66 year old male with history of hypertension, hyperlipidemia, myotonia congentis, multiple surgeries on the spine most recently in August 2022 with fixation of G01-V4 complicated by postoperative wound infection requiring debridement, sepsis, MSSA bacteremia taking Keflex at home presented to the emergency department with complaints of purulent drainage with order from the surgical wound.  CT scan had showed lucency along both T10 transpedicular screws which may indicate loosening.  There was report of purulent drainage with order, lies, generalized weakness,increased erythema and induration around the wound.  Patient was suggested by neurosurgery to come to the hospital.  Patient has been started on  vancomycin .   ID consulted.  Status post I&D of upper lumbar wound,hardware removal by neurosurgery on 3/15.  ID recommending to continue cefadroxil for total of 2 weeks.  Neurosurgery cleared him for discharge.  Medically stable for discharge today.  We will follow-up with ID, neurosurgery as an outpatient. ? ?Following problems were addressed during his hospitalization: ? ?Postoperative wound infection ?Presented with erythema, drainage, foul smell from the surgical wound on the upper back.  Started on vancomycin..  Status post I&D of lumbar wound, hardware removal by neurosurgery on 3/15 ?Aerobic/anaerobic culture showed some staph hemolyticus..  ID changed the antibiotic to cefadroxil, given a dose of oritavancin.  Recommended to continue oral antibiotics for total of 2 weeks. ?PT/OT consulted, no follow-up recommended ?  ?MSSA bacteremia ?History of MSSA bacteremia.  New blood  cultures with no growth till date.  No evidence of sepsis at this time.  No leukocytosis or lactic acidosis.  Was on vancomycin, now changed to cefadroxil.he will follow-up with ID as an outpatient ?   ?Hyperlipidemia ?On lipitor ?  ?Primary hypertension ?Monitor blood pressure.  Continue Toprol, lisinopril ?  ?Myotonia congenita ?Take Sinemet, Dilantin, continue ?  ?Hyperglycemia due to diabetes mellitus ([place]) ?Takes metformin at home.   ?  ?GERD (gastroesophageal reflux disease) ?Continue Protonix ?  ?Obesity: BMI 32.9 ? ? ? ?Discharge Diagnoses:  ?Principal Problem: ?  Postoperative wound infection ?Active Problems: ?  MSSA bacteremia ?  GERD (gastroesophageal reflux disease) ?  Hyperglycemia due to diabetes mellitus ([place]) ?  Myotonia congenita ?  Primary hypertension ?  Hyperlipidemia ?  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  Wound infection after surgery ? ? ? ?Discharge Instructions ? ?Discharge Instructions   ? ? Diet - low sodium heart healthy   Complete by: As directed ?  ? Discharge instructions   Complete by: As directed ?  ? 1)Please take prescribed medications as instructed ?2)Follow up with infectious disease and neurosurgery as an outpatient  ? Increase activity slowly   Complete by: As directed ?  ? No wound care   Complete by: As directed ?  ? ?  ? ?Allergies as of 05/24/2021   ? ?   Reactions  ? Gabapentin Anxiety  ? ?  ? ?  ?Medication List  ?  ? ?STOP taking these medications   ? ?bisacodyl 10 MG suppository ?Commonly known as: DULCOLAX ?  ?carvedilol 25 MG tablet ?Commonly known as: COREG ?  ?cephALEXin 500 MG capsule ?Commonly known as: KEFLEX ?  ?diltiazem 240 MG 24 hr capsule ?Commonly known as:  CARDIZEM CD ?  ?guaiFENesin-dextromethorphan 100-10 MG/5ML syrup ?Commonly known as: ROBITUSSIN DM ?  ?hydrOXYzine 25 MG tablet ?Commonly known as: ATARAX ?  ? ?  ? ?TAKE these medications   ? ?acetaminophen 325 MG tablet ?Commonly known as:  TYLENOL ?Take 2 tablets (650 mg total) by mouth every 6 (six) hours as needed for mild pain, headache or fever. ?  ?albuterol 108 (90 Base) MCG/ACT inhaler ?Commonly known as: VENTOLIN HFA ?Inhale 2 puffs into the lungs every 6 (six) hours as needed for wheezing or shortness of breath. ?  ?aspirin 81 MG chewable tablet ?Chew 1 tablet (81 mg total) by mouth daily. ?  ?atorvastatin 80 MG tablet ?Commonly known as: LIPITOR ?Take 40 mg by mouth daily. ?  ?Carbidopa-Levodopa ER 48.75-195 MG Cpcr ?Take 2 tablets by mouth 2 (two) times daily. ?  ?cefadroxil 500 MG capsule ?Commonly known as: DURICEF ?Take 2 capsules (1,000 mg total) by mouth 2 (two) times daily for 13 days. ?  ?lisinopril 10 MG tablet ?Commonly known as: ZESTRIL ?Take 10 mg by mouth daily. ?  ?loratadine 10 MG tablet ?Commonly known as: CLARITIN ?Take 10 mg by mouth daily. ?  ?LORazepam 1 MG tablet ?Commonly known as: ATIVAN ?Take 1 tablet (1 mg total) by mouth 2 (two) times daily as needed for up to 4 doses for anxiety. ?What changed: when to take this ?  ?meloxicam 15 MG tablet ?Commonly known as: MOBIC ?Take 15 mg by mouth daily. ?  ?metFORMIN 500 MG tablet ?Commonly known as: GLUCOPHAGE ?Take 500 mg by mouth 2 (two) times daily with a meal. ?  ?metoprolol succinate 25 MG 24 hr tablet ?Commonly known as: TOPROL-XL ?Take 25 mg by mouth daily. ?  ?Multi-Vitamin tablet ?Take 1 tablet by mouth daily. ?  ?naloxone 4 MG/0.1ML Liqd nasal spray kit ?Commonly known as: NARCAN ?Place 0.4 mg into the nose once. ?  ?oxyCODONE 5 MG immediate release tablet ?Commonly known as: Oxy IR/ROXICODONE ?Take 1 tablet (5 mg total) by mouth every 6 (six) hours as needed for up to 5 doses for severe pain (pain). ?  ?pantoprazole 40 MG tablet ?Commonly known as: PROTONIX ?Take 40 mg by mouth daily. ?  ?PARoxetine 20 MG tablet ?Commonly known as: PAXIL ?Take 1 tablet (20 mg total) by mouth 2 (two) times daily. ?What changed: how much to take ?  ?phenytoin 100 MG ER  capsule ?Commonly known as: DILANTIN ?Take 100 mg by mouth 3 (three) times daily. ?  ?rOPINIRole 1 MG tablet ?Commonly known as: REQUIP ?Take 0.31m at 6pm and 121mat 9pm. ?What changed:  ?how much to take ?how to take this ?when to take this ?additional instructions ?  ?thiamine 100 MG tablet ?Take 1 tablet (100 mg total) by mouth daily. ?  ?Vitamin D3 125 MCG (5000 UT) Tabs ?Take 5,000 Units by mouth daily. ?  ?zinc gluconate 50 MG tablet ?Take 50 mg by mouth daily. ?  ? ?  ? ? ?Allergies  ?Allergen Reactions  ? Gabapentin Anxiety  ? ? ?Consultations: ?ID, neurosurgery ? ? ?Procedures/Studies: ?CT THORACIC SPINE WO CONTRAST ? ?Result Date: 05/22/2021 ?CLINICAL DATA:  89ear old male with persistent thoracolumbar back pain. Prior surgery with evidence of lower thoracic hardware loosening by CT in January. EXAM: CT THORACIC SPINE WITHOUT CONTRAST TECHNIQUE: Multidetector CT images of the thoracic were obtained using the standard protocol without intravenous contrast. RADIATION DOSE REDUCTION: This exam was performed according to the departmental dose-optimization program which includes automated exposure control, adjustment  of the mA and/or kV according to patient size and/or use of iterative reconstruction technique. COMPARISON:  04/05/2021. FINDINGS: Limited cervical spine imaging: Chronic severe disc space loss at C7-T1 with evidence of developing degenerative interbody ankylosis there. 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 Thoracic spine segmentation: Normal, same numbering system used in January. Alignment: Stable thoracic vertebral height and alignment, including mild degenerative appearing anterolisthesis of T2 on T3. Vertebrae: Postoperative details are below. No acute osseous abnormality identified. Previous T12 vertebral body augmentation appears stable including a small volume of ventral epidural space bone cement at T12 and T11, and involving the right T11 neural  foramen. Paraspinal and other soft tissues: There is increased mild paraspinal soft tissue inflammatory stranding since January at the T10 level. See series 4, image 134. But this is fairly mild and localized. Otherwise stable in

## 2021-05-24 NOTE — Progress Notes (Signed)
Subjective: ?Patient reports patient doing much better pain significantly improved ? ?Objective: ?Vital signs in last 24 hours: ?Temp:  [97.7 ?F (36.5 ?C)-98.3 ?F (36.8 ?C)] 98 ?F (36.7 ?C) (03/17 0802) ?Pulse Rate:  [69-95] 92 (03/17 0802) ?Resp:  [15-18] 18 (03/17 0802) ?BP: (101-126)/(63-84) 122/67 (03/17 0802) ?SpO2:  [92 %-98 %] 97 % (03/17 0802) ? ?Intake/Output from previous day: ?03/16 0701 - 03/17 0700 ?In: 1054.4 [P.O.:1050; I.V.:4.4] ?Out: 21 [Drains:45] ?Intake/Output this shift: ?No intake/output data recorded. ? ?Strength 5 out of 5 wound clean dry and intact ? ?Lab Results: ?Recent Labs  ?  05/21/21 ?1440 05/22/21 ?0353  ?WBC 5.9 5.1  ?HGB 13.5 11.2*  ?HCT 38.9* 31.1*  ?PLT 185 144*  ? ?BMET ?Recent Labs  ?  05/22/21 ?0353 05/23/21 ?0341  ?NA 136 138  ?K 3.7 3.9  ?CL 101 102  ?CO2 25 24  ?GLUCOSE 98 94  ?BUN 8 8  ?CREATININE 0.71 0.72  ?CALCIUM 8.7* 8.7*  ? ? ?Studies/Results: ?CT THORACIC SPINE WO CONTRAST ? ?Result Date: 05/22/2021 ?CLINICAL DATA:  66 year old male with persistent thoracolumbar back pain. Prior surgery with evidence of lower thoracic hardware loosening by CT in January. EXAM: CT THORACIC SPINE WITHOUT CONTRAST TECHNIQUE: Multidetector CT images of the thoracic were obtained using the standard protocol without intravenous contrast. RADIATION DOSE REDUCTION: This exam was performed according to the departmental dose-optimization program which includes automated exposure control, adjustment of the mA and/or kV according to patient size and/or use of iterative reconstruction technique. COMPARISON:  04/05/2021. FINDINGS: Limited cervical spine imaging: Chronic severe disc space loss at C7-T1 with evidence of developing degenerative interbody ankylosis there. 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 Thoracic spine segmentation: Normal, same numbering system used in January. Alignment: Stable thoracic vertebral height and alignment, including mild  degenerative appearing anterolisthesis of T2 on T3. Vertebrae: Postoperative details are below. No acute osseous abnormality identified. Previous T12 vertebral body augmentation appears stable including a small volume of ventral epidural space bone cement at T12 and T11, and involving the right T11 neural foramen. Paraspinal and other soft tissues: There is increased mild paraspinal soft tissue inflammatory stranding since January at the T10 level. See series 4, image 134. But this is fairly mild and localized. Otherwise stable including postoperative changes to the posterior lower thoracic paraspinal soft tissues. Disc levels: T1-T2 through T8-T9: Unchanged since January. T9-T10: Subtle anterolisthesis. Stable disc space. Posterior disc osteophyte complex and moderate posterior element hypertrophy. Suspected mild spinal stenosis here. T10-T11: Progressed vacuum disc here since January. Chronic loosening of the bilateral T10 and right T11 pedicle screws. No convincing arthrodesis. Degenerative bilateral T10 neural foraminal stenosis, at least moderate and there is a small chronic focus of gas in the left lateral recess near the proximal left T10 foramen suggesting disc herniation. T11-T12: Right T11 pedicle screw loosening is chronic. Mild interbody vacuum phenomena is stable. Stable ventral and right inferior foraminal epidural bone cement. Posterior element hypertrophy with no convincing arthrodesis. T12-L1: Stable vertebral body augmentation with ventral epidural bone cement. No pedicle screw loosening. Decreased but not resolved interbody vacuum phenomena. Chronic disc osteophyte complex eccentric to the right. No convincing arthrodesis. IMPRESSION: 1. Chronic T10 and T11 pedicle screw loosening with no convincing postoperative arthrodesis in the lower thoracic spine through T12-L1. 2. Subtle new paraspinal soft tissue inflammation at the T10 level, but progressed vacuum disc at the adjacent T10-T11 space since  January which argues against discitis. No convincing endplate changes of osteomyelitis. But if  suspicion of spinal infection persists Thoracic MRI here would be most sensitive and could be compared to that on 11/26/2020. 3. Otherwise stable postoperative appearance of the lower thoracic spine including T12 vertebral body augmentation with small volume epidural space bone cement. 4. 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 And chronic severe disc space loss at C7-T1 with evidence of developing degenerative interbody ankylosis. Electronically Signed   By: Genevie Ann M.D.   On: 05/22/2021 11:20  ? ?CT LUMBAR SPINE WO CONTRAST ? ?Result Date: 05/22/2021 ?CLINICAL DATA:  66 year old male with persistent thoracolumbar back pain. Prior surgery with evidence of lower thoracic hardware loosening by CT in January. EXAM: CT LUMBAR SPINE WITHOUT CONTRAST TECHNIQUE: Multidetector CT imaging of the lumbar spine was performed without intravenous contrast administration. Multiplanar CT image reconstructions were also generated. RADIATION DOSE REDUCTION: This exam was performed according to the departmental dose-optimization program which includes automated exposure control, adjustment of the mA and/or kV according to patient size and/or use of iterative reconstruction technique. COMPARISON:  Thoracic spine CT today reported separately. Lumbar CT 04/05/2021. FINDINGS: Segmentation: Normal, concordant with the thoracic spine numbering today and the same numbering used in January. Alignment: Stable lumbar vertebral height and alignment since January. Vertebrae: Postoperative details are below. No acute osseous abnormality identified. Intact visible sacrum and SI joints. Paraspinal and other soft tissues: Stable postoperative lumbar paraspinal soft tissue changes. Aortoiliac calcified atherosclerosis. Negative visible noncontrast abdominal viscera. Partially visible bladder distension. Disc levels:  T12-L1:  Reported with the thoracic spine separately. L1-L2: Previous L1 vertebral body augmentation, posterior decompression. Bilateral pedicle screws without evidence of hardware loosening. But absent arthrodesis and pronounced interbody vacuum phenomena with new 7 mm focus of gas in the left lateral recess (series 4, image 21), descending left L2 nerve level. L2-L3:  Stable chronic fusion with solid appearing arthrodesis. L3-L4: Stable chronic decompression and fusion with solid appearing arthrodesis. L4-L5: Stable chronic decompression and fusion with solid appearing arthrodesis. L5-S1: Stable chronic decompression and fusion with solid appearing posterior element arthrodesis. IMPRESSION: 1. Solid postoperative spinal arthrodesis L2 through S1. 2. Previous decompression and fusion at L1-L2 but chronic pseudoarthrosis with pronounced interbody vacuum phenomena, and evidence of a new left lateral recess 7 mm disc herniation. Query left L2 radiculitis. No hardware loosening in the lumbar spine. 3. See also CT Thoracic Spine reported separately. 4. Aortic Atherosclerosis (ICD10-I70.0). Electronically Signed   By: Genevie Ann M.D.   On: 05/22/2021 11:28   ? ?Assessment/Plan: ?Postop day 2 I&D of thoracolumbar wound.  Doing very well we took out the drain he has been mobilizing well from a neurosurgical perspective he can be discharged with scheduled follow-up in 2 weeks once ID finalizes antibiotic recommendations. ? LOS: 3 days  ? ? ? ?Elaina Hoops ?05/24/2021, 10:25 AM ? ? ? ? ?

## 2021-05-26 LAB — CULTURE, BLOOD (ROUTINE X 2)
Culture: NO GROWTH
Culture: NO GROWTH
Special Requests: ADEQUATE
Special Requests: ADEQUATE

## 2021-05-27 LAB — AEROBIC/ANAEROBIC CULTURE W GRAM STAIN (SURGICAL/DEEP WOUND)
Culture: NO GROWTH
Culture: NO GROWTH
Culture: NO GROWTH
Gram Stain: NONE SEEN

## 2021-06-05 ENCOUNTER — Ambulatory Visit (INDEPENDENT_AMBULATORY_CARE_PROVIDER_SITE_OTHER): Payer: No Typology Code available for payment source | Admitting: Infectious Disease

## 2021-06-05 ENCOUNTER — Inpatient Hospital Stay: Payer: No Typology Code available for payment source | Admitting: Internal Medicine

## 2021-06-05 ENCOUNTER — Encounter: Payer: Self-pay | Admitting: Infectious Disease

## 2021-06-05 ENCOUNTER — Other Ambulatory Visit: Payer: Self-pay

## 2021-06-05 ENCOUNTER — Telehealth: Payer: Self-pay

## 2021-06-05 ENCOUNTER — Inpatient Hospital Stay (HOSPITAL_COMMUNITY)
Admission: AD | Admit: 2021-06-05 | Discharge: 2021-06-11 | DRG: 560 | Disposition: A | Discharge status: Home Health | Payer: No Typology Code available for payment source | Source: Ambulatory Visit | Attending: Internal Medicine | Admitting: Internal Medicine

## 2021-06-05 VITALS — BP 129/85 | HR 72 | Temp 97.7°F | Ht 65.0 in | Wt 198.0 lb

## 2021-06-05 DIAGNOSIS — B9561 Methicillin susceptible Staphylococcus aureus infection as the cause of diseases classified elsewhere: Secondary | ICD-10-CM | POA: Diagnosis present

## 2021-06-05 DIAGNOSIS — K458 Other specified abdominal hernia without obstruction or gangrene: Secondary | ICD-10-CM | POA: Diagnosis not present

## 2021-06-05 DIAGNOSIS — E119 Type 2 diabetes mellitus without complications: Secondary | ICD-10-CM

## 2021-06-05 DIAGNOSIS — Z85828 Personal history of other malignant neoplasm of skin: Secondary | ICD-10-CM | POA: Diagnosis not present

## 2021-06-05 DIAGNOSIS — T8149XA Infection following a procedure, other surgical site, initial encounter: Secondary | ICD-10-CM | POA: Diagnosis not present

## 2021-06-05 DIAGNOSIS — T847XXD Infection and inflammatory reaction due to other internal orthopedic prosthetic devices, implants and grafts, subsequent encounter: Secondary | ICD-10-CM

## 2021-06-05 DIAGNOSIS — T847XXA Infection and inflammatory reaction due to other internal orthopedic prosthetic devices, implants and grafts, initial encounter: Secondary | ICD-10-CM | POA: Diagnosis present

## 2021-06-05 DIAGNOSIS — M1990 Unspecified osteoarthritis, unspecified site: Secondary | ICD-10-CM | POA: Diagnosis present

## 2021-06-05 DIAGNOSIS — Z87891 Personal history of nicotine dependence: Secondary | ICD-10-CM

## 2021-06-05 DIAGNOSIS — F411 Generalized anxiety disorder: Secondary | ICD-10-CM

## 2021-06-05 DIAGNOSIS — Z7984 Long term (current) use of oral hypoglycemic drugs: Secondary | ICD-10-CM | POA: Diagnosis not present

## 2021-06-05 DIAGNOSIS — Z809 Family history of malignant neoplasm, unspecified: Secondary | ICD-10-CM

## 2021-06-05 DIAGNOSIS — R7881 Bacteremia: Secondary | ICD-10-CM

## 2021-06-05 DIAGNOSIS — L309 Dermatitis, unspecified: Secondary | ICD-10-CM | POA: Diagnosis not present

## 2021-06-05 DIAGNOSIS — G7112 Myotonia congenita: Secondary | ICD-10-CM | POA: Diagnosis present

## 2021-06-05 DIAGNOSIS — K469 Unspecified abdominal hernia without obstruction or gangrene: Secondary | ICD-10-CM | POA: Diagnosis present

## 2021-06-05 DIAGNOSIS — M4624 Osteomyelitis of vertebra, thoracic region: Secondary | ICD-10-CM | POA: Diagnosis present

## 2021-06-05 DIAGNOSIS — Z7982 Long term (current) use of aspirin: Secondary | ICD-10-CM | POA: Diagnosis not present

## 2021-06-05 DIAGNOSIS — Y793 Surgical instruments, materials and orthopedic devices (including sutures) associated with adverse incidents: Secondary | ICD-10-CM | POA: Diagnosis present

## 2021-06-05 DIAGNOSIS — Z6832 Body mass index (BMI) 32.0-32.9, adult: Secondary | ICD-10-CM | POA: Diagnosis not present

## 2021-06-05 DIAGNOSIS — E7800 Pure hypercholesterolemia, unspecified: Secondary | ICD-10-CM | POA: Diagnosis present

## 2021-06-05 DIAGNOSIS — E1169 Type 2 diabetes mellitus with other specified complication: Secondary | ICD-10-CM | POA: Diagnosis present

## 2021-06-05 DIAGNOSIS — Z79899 Other long term (current) drug therapy: Secondary | ICD-10-CM

## 2021-06-05 DIAGNOSIS — F419 Anxiety disorder, unspecified: Secondary | ICD-10-CM | POA: Diagnosis present

## 2021-06-05 DIAGNOSIS — I10 Essential (primary) hypertension: Secondary | ICD-10-CM | POA: Diagnosis present

## 2021-06-05 DIAGNOSIS — G2581 Restless legs syndrome: Secondary | ICD-10-CM

## 2021-06-05 DIAGNOSIS — E669 Obesity, unspecified: Secondary | ICD-10-CM | POA: Diagnosis present

## 2021-06-05 DIAGNOSIS — Z888 Allergy status to other drugs, medicaments and biological substances status: Secondary | ICD-10-CM

## 2021-06-05 DIAGNOSIS — R52 Pain, unspecified: Secondary | ICD-10-CM | POA: Diagnosis present

## 2021-06-05 DIAGNOSIS — Z791 Long term (current) use of non-steroidal anti-inflammatories (NSAID): Secondary | ICD-10-CM | POA: Diagnosis not present

## 2021-06-05 DIAGNOSIS — E785 Hyperlipidemia, unspecified: Secondary | ICD-10-CM | POA: Diagnosis present

## 2021-06-05 DIAGNOSIS — T8463XA Infection and inflammatory reaction due to internal fixation device of spine, initial encounter: Secondary | ICD-10-CM | POA: Diagnosis present

## 2021-06-05 DIAGNOSIS — K219 Gastro-esophageal reflux disease without esophagitis: Secondary | ICD-10-CM | POA: Diagnosis present

## 2021-06-05 DIAGNOSIS — F32A Depression, unspecified: Secondary | ICD-10-CM | POA: Diagnosis present

## 2021-06-05 DIAGNOSIS — R1903 Right lower quadrant abdominal swelling, mass and lump: Secondary | ICD-10-CM

## 2021-06-05 HISTORY — DX: Unspecified abdominal hernia without obstruction or gangrene: K46.9

## 2021-06-05 HISTORY — DX: Type 2 diabetes mellitus without complications: E11.9

## 2021-06-05 HISTORY — DX: Right lower quadrant abdominal swelling, mass and lump: R19.03

## 2021-06-05 HISTORY — DX: Restless legs syndrome: G25.81

## 2021-06-05 HISTORY — DX: Generalized anxiety disorder: F41.1

## 2021-06-05 LAB — CBC
HCT: 31.5 % — ABNORMAL LOW (ref 39.0–52.0)
Hemoglobin: 11.4 g/dL — ABNORMAL LOW (ref 13.0–17.0)
MCH: 32.1 pg (ref 26.0–34.0)
MCHC: 36.2 g/dL — ABNORMAL HIGH (ref 30.0–36.0)
MCV: 88.7 fL (ref 80.0–100.0)
Platelets: 213 10*3/uL (ref 150–400)
RBC: 3.55 MIL/uL — ABNORMAL LOW (ref 4.22–5.81)
RDW: 13.6 % (ref 11.5–15.5)
WBC: 7.8 10*3/uL (ref 4.0–10.5)
nRBC: 0 % (ref 0.0–0.2)

## 2021-06-05 LAB — GLUCOSE, CAPILLARY
Glucose-Capillary: 119 mg/dL — ABNORMAL HIGH (ref 70–99)
Glucose-Capillary: 192 mg/dL — ABNORMAL HIGH (ref 70–99)

## 2021-06-05 LAB — COMPREHENSIVE METABOLIC PANEL
ALT: 9 U/L (ref 0–44)
AST: 22 U/L (ref 15–41)
Albumin: 3.6 g/dL (ref 3.5–5.0)
Alkaline Phosphatase: 71 U/L (ref 38–126)
Anion gap: 10 (ref 5–15)
BUN: 10 mg/dL (ref 8–23)
CO2: 27 mmol/L (ref 22–32)
Calcium: 9 mg/dL (ref 8.9–10.3)
Chloride: 96 mmol/L — ABNORMAL LOW (ref 98–111)
Creatinine, Ser: 0.69 mg/dL (ref 0.61–1.24)
GFR, Estimated: >60 mL/min (ref >60)
Glucose, Bld: 117 mg/dL — ABNORMAL HIGH (ref 70–99)
Potassium: 3.6 mmol/L (ref 3.5–5.1)
Sodium: 133 mmol/L — ABNORMAL LOW (ref 135–145)
Total Bilirubin: 0.7 mg/dL (ref 0.3–1.2)
Total Protein: 6.4 g/dL — ABNORMAL LOW (ref 6.5–8.1)

## 2021-06-05 LAB — C-REACTIVE PROTEIN: CRP: 20 mg/dL — ABNORMAL HIGH (ref <1.0)

## 2021-06-05 LAB — SEDIMENTATION RATE: Sed Rate: 30 mm/hr — ABNORMAL HIGH (ref 0–16)

## 2021-06-05 MED ORDER — OXYCODONE HCL 5 MG PO TABS
5.0000 mg | ORAL_TABLET | Freq: Four times a day (QID) | ORAL | Status: DC | PRN
  Administered 2021-06-05 – 2021-06-06 (×3): 5 mg via ORAL
  Filled 2021-06-05 (×3): qty 1

## 2021-06-05 MED ORDER — HYDROXYZINE HCL 10 MG PO TABS
10.0000 mg | ORAL_TABLET | Freq: Four times a day (QID) | ORAL | Status: DC | PRN
  Administered 2021-06-06 (×2): 10 mg via ORAL
  Filled 2021-06-05 (×3): qty 1

## 2021-06-05 MED ORDER — CEFADROXIL 500 MG PO CAPS: 1000.0000 mg | ORAL_CAPSULE | Freq: Two times a day (BID) | ORAL | 11 refills | Status: DC

## 2021-06-05 MED ORDER — LORATADINE 10 MG PO TABS
10.0000 mg | ORAL_TABLET | Freq: Every day | ORAL | Status: DC
  Administered 2021-06-06 – 2021-06-11 (×6): 10 mg via ORAL
  Filled 2021-06-05 (×6): qty 1

## 2021-06-05 MED ORDER — PHENYTOIN SODIUM EXTENDED 100 MG PO CAPS
100.0000 mg | ORAL_CAPSULE | Freq: Three times a day (TID) | ORAL | Status: DC
Start: 2021-06-05 — End: 2021-06-11
  Administered 2021-06-05 – 2021-06-11 (×18): 100 mg via ORAL
  Filled 2021-06-05 (×19): qty 1

## 2021-06-05 MED ORDER — ATORVASTATIN CALCIUM 40 MG PO TABS
40.0000 mg | ORAL_TABLET | Freq: Every day | ORAL | Status: DC
  Administered 2021-06-06 – 2021-06-11 (×6): 40 mg via ORAL
  Filled 2021-06-05 (×6): qty 1

## 2021-06-05 MED ORDER — ROPINIROLE HCL 0.5 MG PO TABS
0.5000 mg | ORAL_TABLET | Freq: Every day | ORAL | Status: DC
  Administered 2021-06-06 – 2021-06-10 (×5): 0.5 mg via ORAL
  Filled 2021-06-05 (×5): qty 1

## 2021-06-05 MED ORDER — LORAZEPAM 1 MG PO TABS
1.0000 mg | ORAL_TABLET | Freq: Three times a day (TID) | ORAL | Status: DC
  Administered 2021-06-05 – 2021-06-11 (×18): 1 mg via ORAL
  Filled 2021-06-05 (×18): qty 1

## 2021-06-05 MED ORDER — LISINOPRIL 10 MG PO TABS
10.0000 mg | ORAL_TABLET | Freq: Every day | ORAL | Status: DC
  Administered 2021-06-06 – 2021-06-11 (×6): 10 mg via ORAL
  Filled 2021-06-05 (×6): qty 1

## 2021-06-05 MED ORDER — ROPINIROLE HCL 0.5 MG PO TABS
1.0000 mg | ORAL_TABLET | Freq: Every day | ORAL | Status: DC
  Administered 2021-06-05 – 2021-06-10 (×6): 1 mg via ORAL
  Filled 2021-06-05 (×6): qty 2

## 2021-06-05 MED ORDER — SODIUM CHLORIDE 0.9 % IV SOLN
2.0000 g | INTRAVENOUS | Status: DC
  Administered 2021-06-05: 2 g via INTRAVENOUS
  Filled 2021-06-05: qty 20

## 2021-06-05 MED ORDER — THIAMINE HCL 100 MG PO TABS
100.0000 mg | ORAL_TABLET | Freq: Every day | ORAL | Status: DC
  Administered 2021-06-06 – 2021-06-11 (×6): 100 mg via ORAL
  Filled 2021-06-05 (×6): qty 1

## 2021-06-05 MED ORDER — SODIUM CHLORIDE 0.9% FLUSH
3.0000 mL | Freq: Two times a day (BID) | INTRAVENOUS | Status: DC
  Administered 2021-06-05 – 2021-06-11 (×10): 3 mL via INTRAVENOUS

## 2021-06-05 MED ORDER — PAROXETINE HCL 10 MG PO TABS
10.0000 mg | ORAL_TABLET | Freq: Two times a day (BID) | ORAL | Status: DC
  Administered 2021-06-05 – 2021-06-11 (×12): 10 mg via ORAL
  Filled 2021-06-05 (×13): qty 1

## 2021-06-05 MED ORDER — ACETAMINOPHEN 325 MG PO TABS
650.0000 mg | ORAL_TABLET | Freq: Four times a day (QID) | ORAL | Status: DC | PRN
  Administered 2021-06-06 – 2021-06-09 (×2): 650 mg via ORAL
  Filled 2021-06-05 (×2): qty 2

## 2021-06-05 MED ORDER — PANTOPRAZOLE SODIUM 40 MG PO TBEC
40.0000 mg | DELAYED_RELEASE_TABLET | Freq: Every day | ORAL | Status: DC
  Administered 2021-06-06 – 2021-06-11 (×6): 40 mg via ORAL
  Filled 2021-06-05 (×6): qty 1

## 2021-06-05 MED ORDER — CARBIDOPA-LEVODOPA ER 48.75-195 MG PO CPCR
2.0000 | ORAL_CAPSULE | Freq: Two times a day (BID) | ORAL | Status: DC
  Administered 2021-06-05 – 2021-06-11 (×12): 2 via ORAL
  Filled 2021-06-05 (×14): qty 2

## 2021-06-05 MED ORDER — ACETAMINOPHEN 650 MG RE SUPP: 650.0000 mg | Freq: Four times a day (QID) | RECTAL | Status: DC | PRN

## 2021-06-05 MED ORDER — METOPROLOL SUCCINATE ER 25 MG PO TB24
25.0000 mg | ORAL_TABLET | Freq: Every day | ORAL | Status: DC
  Administered 2021-06-06 – 2021-06-11 (×6): 25 mg via ORAL
  Filled 2021-06-05 (×6): qty 1

## 2021-06-05 MED ORDER — ADULT MULTIVITAMIN W/MINERALS CH
1.0000 | ORAL_TABLET | Freq: Every day | ORAL | Status: DC
  Administered 2021-06-06 – 2021-06-11 (×6): 1 via ORAL
  Filled 2021-06-05 (×6): qty 1

## 2021-06-05 MED ORDER — VANCOMYCIN HCL 1750 MG/350ML IV SOLN
1750.0000 mg | Freq: Once | INTRAVENOUS | Status: DC
  Administered 2021-06-05: 1750 mg via INTRAVENOUS
  Filled 2021-06-05: qty 350

## 2021-06-05 MED ORDER — VANCOMYCIN HCL IN DEXTROSE 1-5 GM/200ML-% IV SOLN
1000.0000 mg | Freq: Two times a day (BID) | INTRAVENOUS | Status: DC
  Filled 2021-06-05: qty 200

## 2021-06-05 MED ORDER — SODIUM CHLORIDE 0.9% FLUSH: 3.0000 mL | INTRAVENOUS | Status: DC | PRN

## 2021-06-05 MED ORDER — MORPHINE SULFATE (PF) 2 MG/ML IV SOLN
1.0000 mg | INTRAVENOUS | Status: DC | PRN
  Administered 2021-06-05 – 2021-06-11 (×28): 1 mg via INTRAVENOUS
  Filled 2021-06-05 (×28): qty 1

## 2021-06-05 MED ORDER — CEFAZOLIN SODIUM-DEXTROSE 2-4 GM/100ML-% IV SOLN
2.0000 g | Freq: Three times a day (TID) | INTRAVENOUS | Status: DC
  Administered 2021-06-05 – 2021-06-11 (×18): 2 g via INTRAVENOUS
  Filled 2021-06-05 (×17): qty 100

## 2021-06-05 MED ORDER — SODIUM CHLORIDE 0.9 % IV SOLN
250.0000 mL | INTRAVENOUS | Status: DC | PRN
Start: 2021-06-05 — End: 2021-06-11

## 2021-06-05 MED ORDER — INSULIN ASPART 100 UNIT/ML IJ SOLN
0.0000 [IU] | Freq: Three times a day (TID) | INTRAMUSCULAR | Status: DC
  Administered 2021-06-06: 1 [IU] via SUBCUTANEOUS
  Administered 2021-06-06: 3 [IU] via SUBCUTANEOUS
  Administered 2021-06-07 – 2021-06-10 (×9): 1 [IU] via SUBCUTANEOUS
  Administered 2021-06-10 – 2021-06-11 (×2): 2 [IU] via SUBCUTANEOUS
  Administered 2021-06-11: 1 [IU] via SUBCUTANEOUS

## 2021-06-05 NOTE — Progress Notes (Signed)
? ?Subjective:  ?Chief complaint: Worsening drainage from his wound with increasing pain and redness in enlarging soft tissue in that area as well as an area in his right abdomen that he is concerned about ? Patient ID: Danny Hampton, adult    DOB: 1955-06-30, 66 y.o.   MRN: [id number] ? ?HPI ? ?Danny Hampton is a 66 year old Caucasian veteran with history of spinal surgery in August 1638 complicated by surgical site infection and MSSA bacteremia. ? ?There is been concerned that hardware is involved and he has been on protracted cefadroxil after completing his cefazolin.  Fortunately did not have endocarditis on TEE. ? ?Rifampin was not a possibility as an adjunct of therapy due drug drug interactions. ? ?His pain level seems relatively stable compared to previously. ? ?Drainage from the wound itself however has worsened in the last 2 weeks.  He says that at one point he was walking and thought he had "stepped in dog shit."  Does not 1 point when his wound dressing was being taken down his wife had to leave the room because she was going to vomit. ? ?He is having increasing soaking of the wound with purulent material compared to previously when I saw him last ? ?He had a CT of the lumbar and thoracic spine performed on 27 January did show a lucency along both T10 transpedicular screws which may indicate loosening. ? ? ?He was taken the operating room in March and had I&D with cultures taken which did not yield any organisms he was given a dose of oritavancin and placed back on cefadroxil. ? ?He has been taking the cefadroxil religiously. ? ?He takes 2 tablets twice daily. ? ?In the interval however he has had worsening drainage from his back yet again and now increasing pain and increasing bulging of the wound. ? ?He has another area on his right flank which has bulged out in the last 24 hours and he was concerned this was infected as well though I do not think this is an area of infection based on its appearance and on  palpating the area. ? ? ? ? ?Past Medical History:  ?Diagnosis Date  ? Acid reflux   ? Anxiety   ? Arrhythmia   ? paroxysmal supreventricular tachycardia  ? Arthritis   ? Complication of anesthesia   ? Myotonia congenita  ? Dysrhythmia   ? Essential hypertension   ? Family history of adverse reaction to anesthesia   ? Sister  ? Hardware complicating wound infection ([place]) 04/11/2021  ? High cholesterol   ? History of skin cancer   ? Myotonia congenita   ? Type 2 diabetes mellitus ([place])   ? ? ?Past Surgical History:  ?Procedure Laterality Date  ? APPLICATION OF WOUND VAC  11/27/2020  ? Procedure: APPLICATION OF WOUND VAC;  Surgeon: Kristeen Miss, MD;  Location: [facility];  Service: Neurosurgery;;  ? BACK SURGERY    ? times 2  ? CARPAL TUNNEL RELEASE    ? CATARACT EXTRACTION W/PHACO Left 01/31/2019  ? Procedure: CATARACT EXTRACTION PHACO AND INTRAOCULAR LENS PLACEMENT (IOC);  Surgeon: Baruch Goldmann, MD;  Location: [facility];  Service: Ophthalmology;  Laterality: Left;  CDE: 2.66  ? LUMBAR WOUND DEBRIDEMENT N/A 11/27/2020  ? Procedure: LUMBAR WOUND DEBRIDEMENT AND WASHOUT;  Surgeon: Kristeen Miss, MD;  Location: [facility];  Service: Neurosurgery;  Laterality: N/A;  ? LUMBAR WOUND DEBRIDEMENT N/A 05/22/2021  ? Procedure: Thoracic wound debridement with removal of hardware at Thoracic ten-eleven;  Surgeon: Kary Kos,  MD;  Location: [facility];  Service: Neurosurgery;  Laterality: N/A;  ? NOSE SURGERY    ? TEE WITHOUT CARDIOVERSION N/A 12/03/2020  ? Procedure: TRANSESOPHAGEAL ECHOCARDIOGRAM (TEE);  Surgeon: Jerline Pain, MD;  Location[facility];  Service: Cardiovascular;  Laterality: N/A;  ? TOTAL KNEE ARTHROPLASTY Right 10/12/2018  ? Procedure: RIGHT TOTAL KNEE ARTHROPLASTY;  Surgeon: Garald Balding, MD;  Location: [facility];  Service: Orthopedics;  Laterality: Right;  ? ? ?Family History  ?Problem Relation Age of Onset  ? Healthy Mother   ? Healthy Father   ? Cancer Father   ? ? ?  ?Social History  ? ?Socioeconomic History  ? Marital  status: Divorced  ?  Spouse name: Not on file  ? Number of children: 0  ? Years of education: Not on file  ? Highest education level: Not on file  ?Occupational History  ? Occupation: retired Clinical biochemist  ?Tobacco Use  ? Smoking status: Former  ?  Packs/day: 1.00  ?  Years: 30.00  ?  Pack years: 30.00  ?  Types: Cigarettes  ?  Quit date: 03/2018  ?  Years since quitting: 3.2  ? Smokeless tobacco: Never  ?Vaping Use  ? Vaping Use: Never used  ?Substance and Sexual Activity  ? Alcohol use: Not Currently  ?  Comment: occasional drink  ? Drug use: No  ? Sexual activity: Not Currently  ?Other Topics Concern  ? Not on file  ?Social History Narrative  ? Right handed  ? ?Social Determinants of Health  ? ?Financial Resource Strain: Not on file  ?Food Insecurity: Not on file  ?Transportation Needs: Not on file  ?Physical Activity: Not on file  ?Stress: Not on file  ?Social Connections: Not on file  ? ? ?Allergies  ?Allergen Reactions  ? Gabapentin Anxiety  ? ? ? ?Current Outpatient Medications:  ?  acetaminophen (TYLENOL) 325 MG tablet, Take 2 tablets (650 mg total) by mouth every 6 (six) hours as needed for mild pain, headache or fever., Disp: , Rfl:  ?  albuterol (VENTOLIN HFA) 108 (90 Base) MCG/ACT inhaler, Inhale 2 puffs into the lungs every 6 (six) hours as needed for wheezing or shortness of breath. (Patient not taking: Reported on 02/06/2021), Disp: 8 g, Rfl: 1 ?  aspirin 81 MG chewable tablet, Chew 1 tablet (81 mg total) by mouth daily., Disp: , Rfl:  ?  atorvastatin (LIPITOR) 80 MG tablet, Take 40 mg by mouth daily. , Disp: , Rfl:  ?  Carbidopa-Levodopa ER 48.75-195 MG CPCR, Take 2 tablets by mouth 2 (two) times daily., Disp: , Rfl:  ?  cefadroxil (DURICEF) 500 MG capsule, Take 2 capsules (1,000 mg total) by mouth 2 (two) times daily for 13 days., Disp: 52 capsule, Rfl: 0 ?  Cholecalciferol (VITAMIN D3) 125 MCG (5000 UT) TABS, Take 5,000 Units by mouth daily. , Disp: , Rfl:  ?  lisinopril (ZESTRIL) 10 MG tablet, Take  10 mg by mouth daily., Disp: , Rfl:  ?  loratadine (CLARITIN) 10 MG tablet, Take 10 mg by mouth daily., Disp: , Rfl:  ?  LORazepam (ATIVAN) 1 MG tablet, Take 1 tablet (1 mg total) by mouth 2 (two) times daily as needed for up to 4 doses for anxiety. (Patient taking differently: Take 1 mg by mouth 3 (three) times daily.), Disp: 4 tablet, Rfl: 0 ?  meloxicam (MOBIC) 15 MG tablet, Take 15 mg by mouth daily., Disp: , Rfl:  ?  metFORMIN (GLUCOPHAGE) 500 MG tablet,  Take 500 mg by mouth 2 (two) times daily with a meal. , Disp: , Rfl:  ?  metoprolol succinate (TOPROL-XL) 25 MG 24 hr tablet, Take 25 mg by mouth daily., Disp: , Rfl:  ?  Multiple Vitamin (MULTI-VITAMIN) tablet, Take 1 tablet by mouth daily., Disp: , Rfl:  ?  naloxone (NARCAN) nasal spray 4 mg/0.1 mL, Place 0.4 mg into the nose once., Disp: , Rfl:  ?  oxyCODONE (OXY IR/ROXICODONE) 5 MG immediate release tablet, Take 1 tablet (5 mg total) by mouth every 6 (six) hours as needed for up to 5 doses for severe pain (pain)., Disp: 10 tablet, Rfl: 0 ?  pantoprazole (PROTONIX) 40 MG tablet, Take 40 mg by mouth daily., Disp: , Rfl:  ?  PARoxetine (PAXIL) 20 MG tablet, Take 1 tablet (20 mg total) by mouth 2 (two) times daily. (Patient taking differently: Take 10 mg by mouth 2 (two) times daily.), Disp: , Rfl:  ?  phenytoin (DILANTIN) 100 MG ER capsule, Take 100 mg by mouth 3 (three) times daily., Disp: , Rfl:  ?  rOPINIRole (REQUIP) 1 MG tablet, Take 0.'5mg'$  at 6pm and '1mg'$  at 9pm. (Patient taking differently: Take 0.5 mg by mouth See admin instructions. Take 0.5 mg by mouth 6pm  and  1 mg at 10 pm), Disp: 135 tablet, Rfl: 3 ?  thiamine 100 MG tablet, Take 1 tablet (100 mg total) by mouth daily. (Patient not taking: Reported on 05/21/2021), Disp: , Rfl:  ?  zinc gluconate 50 MG tablet, Take 50 mg by mouth daily., Disp: , Rfl:  ? ? ?Review of Systems  ?Constitutional:  Negative for activity change, appetite change, chills, diaphoresis, fatigue, fever and unexpected weight  change.  ?HENT:  Negative for congestion, rhinorrhea, sinus pressure, sneezing, sore throat and trouble swallowing.   ?Eyes:  Negative for photophobia and visual disturbance.  ?Respiratory:  Negative for coug

## 2021-06-05 NOTE — Assessment & Plan Note (Signed)
Not appreciable on exam.  ?? Hernia ?Will continue to follow and imaging per day team  ?

## 2021-06-05 NOTE — Assessment & Plan Note (Signed)
A1c of 5.8 in 05/2021 ?Well controlled, hold metformin while inpatient ?SSI and accuchecks per protocol  ?

## 2021-06-05 NOTE — Progress Notes (Addendum)
I dc vancomycin and ctx ? ?Patient has known cause which was MSSA that even got into his blood steam. Going broader now without basis in cultures only exposed him to risk and obfuscates his possible culture data. MSSA is plenty bad by itself and no reason to cover MRSA and hazard nephroticixty or ceftriaxone w greater risk of c diff and no reason to use it here either  ? ? have therefore switched to cefazolin. Unless he has no worsened osteo ot a bacteremia would not think he even needs IV abx he needs SOURCE control which hopefully Dr Saintclair Halsted can achieve w external wound vacuum. ID MD at the [facility] in [place] had additional data re a scan but not sure when and where this was done. I will touch base w her tomorrow as she was home by time I returned her page   ?

## 2021-06-05 NOTE — H&P (Addendum)
?History and Physical  ? ? ?Patient: Danny Hampton QJJ:[id number] DOB: 01/17/1956 ?DOA: 06/05/2021 ?DOS: the patient was seen and examined on 06/05/2021 ?PCP: System, Provider Not In  ?Patient coming from:  ID clinic  - lives with his girlfriend  ? ? ?Chief Complaint: "worsening drainage from spine/pain/swelling" ? ?HPI: Danny Hampton is a 66 y.o. adult with medical history significant of   ?HTN, HLD, T2DM, myotonia congentis, multiple surgeries on the spine most recently in August 2022 with fixation of G81-E5 complicated by postoperative wound infection requiring debridement, sepsis, MSSA bacteremia, recently discharged on 2 weeks of antibiotics on 05/25/2022 but continues to have worsening drainage from his back. He is s/p I&D two weeks ago by neurosurgery as well. He was seen in ID clinic by Dr. [name] today who sent him to hospital due to pus. He denies any fever/chills. He has had drainage since January/February. His back has become more swollen, tender, red over the past 2 weeks. Drainage remains purulent.  ? ?No fever/chills,  no chest pain or palpitations, no shortness of breath or cough, stomach pain, N/V, has had diarrhea daily, no dysuria or leg swelling.  ? ?Vitals on admission:  afebrile, bp: 114/78, HR: 84, RR: 17, oxygen: 96%RA ? ?Review of Systems: As mentioned in the history of present illness. All other systems reviewed and are negative. ?Past Medical History:  ?Diagnosis Date  ? Acid reflux   ? Anxiety   ? Arrhythmia   ? paroxysmal supreventricular tachycardia  ? Arthritis   ? Complication of anesthesia   ? Myotonia congenita  ? Dysrhythmia   ? Essential hypertension   ? Family history of adverse reaction to anesthesia   ? Sister  ? Hardware complicating wound infection ([place]) 04/11/2021  ? Hernia, abdominal 06/05/2021  ? High cholesterol   ? History of skin cancer   ? Myotonia congenita   ? Type 2 diabetes mellitus ([place])   ? ?Past Surgical History:  ?Procedure Laterality Date  ? APPLICATION OF WOUND VAC   11/27/2020  ? Procedure: APPLICATION OF WOUND VAC;  Surgeon: Kristeen Miss, MD;  Location: [place];  Service: Neurosurgery;;  ? BACK SURGERY    ? times 2  ? CARPAL TUNNEL RELEASE    ? CATARACT EXTRACTION W/PHACO Left 01/31/2019  ? Procedure: CATARACT EXTRACTION PHACO AND INTRAOCULAR LENS PLACEMENT (IOC);  Surgeon: Baruch Goldmann, MD;  [facility];  Service: Ophthalmology;  Laterality: Left;  CDE: 2.66  ? LUMBAR WOUND DEBRIDEMENT N/A 11/27/2020  ? Procedure: LUMBAR WOUND DEBRIDEMENT AND WASHOUT;  Surgeon: Kristeen Miss, MD;  Location: [facility];  Service: Neurosurgery;  Laterality: N/A;  ? LUMBAR WOUND DEBRIDEMENT N/A 05/22/2021  ? Procedure: Thoracic wound debridement with removal of hardware at Thoracic ten-eleven;  Surgeon: Kary Kos, MD;  Location: [facility];  Service: Neurosurgery;  Laterality: N/A;  ? NOSE SURGERY    ? TEE WITHOUT CARDIOVERSION N/A 12/03/2020  ? Procedure: TRANSESOPHAGEAL ECHOCARDIOGRAM (TEE);  Surgeon: Jerline Pain, MD;  [facility] ENDOSCOPY;  Service: Cardiovascular;  Laterality: N/A;  ? TOTAL KNEE ARTHROPLASTY Right 10/12/2018  ? Procedure: RIGHT TOTAL KNEE ARTHROPLASTY;  Surgeon: Garald Balding, MD;  Location: [facility];  Service: Orthopedics;  Laterality: Right;  ? ?Social History:  reports that he quit smoking about 3 years ago. His smoking use included cigarettes. He has a 30.00 pack-year smoking history. He has never used smokeless tobacco. He reports that he does not currently use alcohol. He reports current drug use. Drug: Marijuana. ? ?Allergies  ?  Allergen Reactions  ? Gabapentin Anxiety  ? ? ?Family History  ?Problem Relation Age of Onset  ? Healthy Mother   ? Healthy Father   ? Cancer Father   ? ? ?Prior to Admission medications   ?Medication Sig Start Date End Date Taking? Authorizing Provider  ?acetaminophen (TYLENOL) 325 MG tablet Take 2 tablets (650 mg total) by mouth every 6 (six) hours as needed for mild pain, headache or fever. 12/06/20   Antonieta Pert, MD  ?albuterol (VENTOLIN HFA)  108 (90 Base) MCG/ACT inhaler Inhale 2 puffs into the lungs every 6 (six) hours as needed for wheezing or shortness of breath. ?Patient not taking: Reported on 02/06/2021 04/26/20   Barton Dubois, MD  ?aspirin 81 MG chewable tablet Chew 1 tablet (81 mg total) by mouth daily. 04/26/20   Barton Dubois, MD  ?atorvastatin (LIPITOR) 80 MG tablet Take 40 mg by mouth daily.     [provider]  ?Carbidopa-Levodopa ER 48.75-195 MG CPCR Take 2 tablets by mouth 2 (two) times daily.    [provider]  ?cefadroxil (DURICEF) 500 MG capsule Take 2 capsules (1,000 mg total) by mouth 2 (two) times daily. 06/05/21 07/05/21  Truman Hayward, MD  ?Cholecalciferol (VITAMIN D3) 125 MCG (5000 UT) TABS Take 5,000 Units by mouth daily.     [provider]  ?HYDROXYZINE HCL PO Take by mouth.    [provider]  ?lisinopril (ZESTRIL) 10 MG tablet Take 10 mg by mouth daily.    [provider]  ?loratadine (CLARITIN) 10 MG tablet Take 10 mg by mouth daily.    [provider]  ?LORazepam (ATIVAN) 1 MG tablet Take 1 tablet (1 mg total) by mouth 2 (two) times daily as needed for up to 4 doses for anxiety. ?Patient taking differently: Take 1 mg by mouth 3 (three) times daily. 12/06/20   Antonieta Pert, MD  ?meloxicam (MOBIC) 15 MG tablet Take 15 mg by mouth daily.    [provider]  ?metFORMIN (GLUCOPHAGE) 500 MG tablet Take 500 mg by mouth 2 (two) times daily with a meal.     [provider]  ?metoprolol succinate (TOPROL-XL) 25 MG 24 hr tablet Take 25 mg by mouth daily.    [provider]  ?Multiple Vitamin (MULTI-VITAMIN) tablet Take 1 tablet by mouth daily.    [provider]  ?naloxone [facility]) nasal spray 4 mg/0.1 mL Place 0.4 mg into the nose once. ?Patient not taking: Reported on 06/05/2021 03/12/21   [provider]  ?oxyCODONE (OXY IR/ROXICODONE) 5 MG immediate release tablet Take 1 tablet (5 mg total) by mouth every 6 (six) hours as needed  for up to 5 doses for severe pain (pain). ?Patient not taking: Reported on 06/05/2021 05/24/21   Shelly Coss, MD  ?pantoprazole (PROTONIX) 40 MG tablet Take 40 mg by mouth daily.    [provider]  ?PARoxetine (PAXIL) 20 MG tablet Take 1 tablet (20 mg total) by mouth 2 (two) times daily. ?Patient taking differently: Take 10 mg by mouth 2 (two) times daily. 12/06/20   Antonieta Pert, MD  ?phenytoin (DILANTIN) 100 MG ER capsule Take 100 mg by mouth 3 (three) times daily.    [provider]  ?rOPINIRole (REQUIP) 1 MG tablet Take 0.'5mg'$  at 6pm and '1mg'$  at 9pm. ?Patient taking differently: Take 0.5 mg by mouth See admin instructions. Take 0.5 mg by mouth 6pm  and  1 mg at 10 pm 03/21/19   Alda Berthold, DO  ?  thiamine 100 MG tablet Take 1 tablet (100 mg total) by mouth daily. 12/07/20   Antonieta Pert, MD  ?zinc gluconate 50 MG tablet Take 50 mg by mouth daily.    [provider]  ? ? ?Physical Exam: ?Vitals:  ? 06/05/21 1609 06/05/21 1835 06/05/21 1836  ?BP: 114/78    ?Pulse: 84    ?Resp: 17    ?Temp: 98.2 ?F (36.8 ?C)    ?TempSrc: Oral    ?SpO2: 96%    ?Weight:   89.8 kg  ?Height:  '5\' 5"'$  (1.651 m)   ? ?General:  Appears calm and comfortable and is in NAD ?Eyes:  PERRL, EOMI, normal lids, iris ?ENT:  grossly normal hearing, lips & tongue, mmm; appropriate dentition ?Neck:  no LAD, masses or thyromegaly; no carotid bruits ?Cardiovascular:  RRR, no m/r/g. No LE edema.  ?Respiratory:   CTA bilaterally with no wheezes/rales/rhonchi.  Normal respiratory effort. ?Abdomen:  soft, NT, ND, NABS. No appreciable mass palpated laying down  ?Back:   normal alignment, no CVAT ?Skin:  no rash or induration seen on limited exam. Back he has fluctuant area around incision with surrounding erythema. Yellow drainage on bandage.  ? ? ?Musculoskeletal:  grossly normal tone BUE/BLE, good ROM, no bony abnormality ?Lower extremity:  No LE edema.  Limited foot exam with no ulcerations.  2+ distal pulses. ?Psychiatric:  grossly  normal mood and affect, speech fluent and appropriate, AOx3 ?Neurologic:  CN 2-12 grossly intact, moves all extremities in coordinated fashion, sensation intact ? ? ?Radiological Exams on Admission:

## 2021-06-05 NOTE — Assessment & Plan Note (Signed)
Continue requip

## 2021-06-05 NOTE — Progress Notes (Deleted)
I have therefore switched to cefazolin. Unless he has no worsened osteo ot a bacteremia would not think he even needs IV abx he needs SOURCE control which hopefully Dr Saintclair Halsted can achieve w external wound vacuum. ID MD had additional data re a scan but not sure when and where this was done. I will touch base w her tomorrow as she was home by time I returned her page

## 2021-06-05 NOTE — Assessment & Plan Note (Signed)
Continue lipitor  ?

## 2021-06-05 NOTE — Assessment & Plan Note (Signed)
66 year old male with history of multiple surgeries on the spine most recently in August 2022 with fixation of R24-X9 complicated by postoperative wound infection requiring debridement, sepsis, MSSA bacteremia s/p I&D 2 weeks ago presenting with worsening edema, redness and drainage from operative site despite being on antibiotics.  ?-admit to med surg ?-ID consulted-vanc/rocephin for now. Has been on cefadroxil after completing cefazolin  ?-Neurosurgery, Dr Saintclair Halsted following. Recommend wound vac tomorrow ?-blood cultures, inflammatory markers, cbc pending  ?-hold ASA for procedures  ?-pain control with oral oxycodone and IV morphine for severe pain  ?

## 2021-06-05 NOTE — Assessment & Plan Note (Signed)
Continue protonix  

## 2021-06-05 NOTE — Telephone Encounter (Signed)
Per Dr. [name], called [facility] Bed Control at 10:13 am to assist with patient's direct admission. Dr. [name] requested inpatient bed; reason for admission was surgical site infection. Admitting MD is Dr. Lala Lund.  ? ?No beds available this am. Communicated to patient that admission was anticipated later this afternoon. Per patient, best phone number to reach him is [phone]. ? ?Updated patient and spouse on bed request information and educated them to expect call later today, and to check in to hospital as a direct admission. Patient and spouse had no further questions. ? ?Binnie Kand, RN  ?

## 2021-06-05 NOTE — Progress Notes (Signed)
Pharmacy Antibiotic Note ? ?Danny Hampton is a 66 y.o. adult admitted on 06/05/2021 presenting with worsening wound drainage s/p I&D, hardware involvement, hx of MSSA bacteremia.  Pharmacy has been consulted for vancomycin dosing.  Ceftriaxone per MD ? ?Plan: ?Vancomycin 1750 mg IV x 1, then 1000 mg IV q 12h (target vancomycin trough 15-20) ?Monitor renal function, Cx, clinical progression and ID/NSGY recs ?Vancomycin levels as needed ? ?  ? ?Temp (24hrs), Avg:98 ?F (36.7 ?C), Min:97.7 ?F (36.5 ?C), Max:98.2 ?F (36.8 ?C) ? ?No results for input(s): WBC, CREATININE, LATICACIDVEN, VANCOTROUGH, VANCOPEAK, VANCORANDOM, GENTTROUGH, GENTPEAK, GENTRANDOM, TOBRATROUGH, TOBRAPEAK, TOBRARND, AMIKACINPEAK, AMIKACINTROU, AMIKACIN in the last 168 hours.  ?Estimated Creatinine Clearance (by C-G formula based on SCr of 0.72 mg/dL) ?Male: 77.6 mL/min ?Male: 94.8 mL/min   ? ?Allergies  ?Allergen Reactions  ? Gabapentin Anxiety  ? ? ?Bertis Ruddy, PharmD ?Clinical Pharmacist ?ED Pharmacist Phone # [phone] ?06/05/2021 5:11 PM ? ? ?

## 2021-06-05 NOTE — Telephone Encounter (Signed)
Dr. Binnie Rail called regarding some concerns she had with the patient and the imaging she received. She stated she feels patient should be placed back on IV antibiotics and she stated that her office would be happy to assist with this. I did advise her that patient is being admitted today and was prescribed cefadroxil, but she did stated she still feel he will need IV antibiotics.  ?Dr. Binnie Rail can be reached at [phone] and paged through the operator. Her pager number is [phone]. ?I have also resent the the cefadroxil RX for you Dr. [name] with the patient taking 2 capsules BID ?

## 2021-06-05 NOTE — Assessment & Plan Note (Addendum)
Continue [paxil, ativan and hydroxyzine ?pmp website verified and fills/takes ativan '1mg'$  TID appropriately  ?

## 2021-06-05 NOTE — Assessment & Plan Note (Signed)
Continue lisinopril and metoprolol ?

## 2021-06-05 NOTE — Plan of Care (Signed)
  Problem: Pain Managment: Goal: General experience of comfort will improve Outcome: Progressing   Problem: Safety: Goal: Ability to remain free from injury will improve Outcome: Progressing   

## 2021-06-05 NOTE — Consult Note (Signed)
Reason for Consult:wound infection ?Referring Physician: TRH ? ?Danny Hampton is an 66 y.o. adult.  ?HPI: patient well known to me status post wound I&D 2 weeks ago presenting with recurrent drainage despite IV antibiotics.  No fever and overall pain stable, no new numbness/tingling/or weakness ? ?Past Medical History:  ?Diagnosis Date  ? Acid reflux   ? Anxiety   ? Arrhythmia   ? paroxysmal supreventricular tachycardia  ? Arthritis   ? Complication of anesthesia   ? Myotonia congenita  ? Dysrhythmia   ? Essential hypertension   ? Family history of adverse reaction to anesthesia   ? Sister  ? Hardware complicating wound infection ([place]) 04/11/2021  ? Hernia, abdominal 06/05/2021  ? High cholesterol   ? History of skin cancer   ? Myotonia congenita   ? Type 2 diabetes mellitus ([place])   ? ? ?Past Surgical History:  ?Procedure Laterality Date  ? APPLICATION OF WOUND VAC  11/27/2020  ? Procedure: APPLICATION OF WOUND VAC;  Surgeon: Kristeen Miss, MD;  Location: [place];  Service: Neurosurgery;;  ? BACK SURGERY    ? times 2  ? CARPAL TUNNEL RELEASE    ? CATARACT EXTRACTION W/PHACO Left 01/31/2019  ? Procedure: CATARACT EXTRACTION PHACO AND INTRAOCULAR LENS PLACEMENT (IOC);  Surgeon: Baruch Goldmann, MD;  [facility];  Service: Ophthalmology;  Laterality: Left;  CDE: 2.66  ? LUMBAR WOUND DEBRIDEMENT N/A 11/27/2020  ? Procedure: LUMBAR WOUND DEBRIDEMENT AND WASHOUT;  Surgeon: Kristeen Miss, MD;  Location: [place];  Service: Neurosurgery;  Laterality: N/A;  ? LUMBAR WOUND DEBRIDEMENT N/A 05/22/2021  ? Procedure: Thoracic wound debridement with removal of hardware at Thoracic ten-eleven;  Surgeon: Kary Kos, MD;  Location: [place];  Service: Neurosurgery;  Laterality: N/A;  ? NOSE SURGERY    ? TEE WITHOUT CARDIOVERSION N/A 12/03/2020  ? Procedure: TRANSESOPHAGEAL ECHOCARDIOGRAM (TEE);  Surgeon: Jerline Pain, MD;  [facility] END[facility]COPY;  Service: Cardiovascular;  Laterality: N/A;  ? TOTAL KNEE ARTHROPLASTY Right 10/12/2018  ?  Procedure: RIGHT TOTAL KNEE ARTHROPLASTY;  Surgeon: Garald Balding, MD;  [facility];  Service: Orthopedics;  Laterality: Right;  ? ? ?Family History  ?Problem Relation Age of Onset  ? Healthy Mother   ? Healthy Father   ? Cancer Father   ? ? ?Social History:  reports that he quit smoking about 3 years ago. His smoking use included cigarettes. He has a 30.00 pack-year smoking history. He has never used smokeless tobacco. He reports that he does not currently use alcohol. He reports current drug use. Drug: Marijuana. ? ?Allergies:  ?Allergies  ?Allergen Reactions  ? Gabapentin Anxiety  ? ? ?Medications: I have reviewed the patient's current medications. ? ?No results found for this or any previous visit (from the past 48 hour(s)). ? ?No results found. ? ?Review of Systems  ?Musculoskeletal:  Positive for back pain.  ?Blood pressure 114/78, pulse 84, temperature 98.2 ?F (36.8 ?C), temperature source Oral, resp. rate 17, SpO2 96 %. ?Physical Exam ?Neurological:  ?   Comments: Strength 5/5, incision with selling and erythema with small area of pinhole drainage, dressing relatively dry changed at 1.  ? ? ?Assessment/Plan: ?Patient with persistent wound infection and recurrent drainage.  I think the next best course of action is an external wound vac.  Will discuss with team and wound care and place in am. ? ?Elaina Hoops ?06/05/2021, 5:14 PM  ? ? ? ? ?

## 2021-06-06 ENCOUNTER — Inpatient Hospital Stay (HOSPITAL_COMMUNITY): Payer: No Typology Code available for payment source

## 2021-06-06 ENCOUNTER — Encounter (HOSPITAL_COMMUNITY): Payer: Self-pay | Admitting: Family Medicine

## 2021-06-06 DIAGNOSIS — T847XXD Infection and inflammatory reaction due to other internal orthopedic prosthetic devices, implants and grafts, subsequent encounter: Secondary | ICD-10-CM | POA: Diagnosis not present

## 2021-06-06 LAB — BASIC METABOLIC PANEL
Anion gap: 13 (ref 5–15)
BUN: 12 mg/dL (ref 8–23)
CO2: 24 mmol/L (ref 22–32)
Calcium: 8.7 mg/dL — ABNORMAL LOW (ref 8.9–10.3)
Chloride: 96 mmol/L — ABNORMAL LOW (ref 98–111)
Creatinine, Ser: 0.68 mg/dL (ref 0.61–1.24)
GFR, Estimated: >60 mL/min (ref >60)
Glucose, Bld: 112 mg/dL — ABNORMAL HIGH (ref 70–99)
Potassium: 3.8 mmol/L (ref 3.5–5.1)
Sodium: 133 mmol/L — ABNORMAL LOW (ref 135–145)

## 2021-06-06 LAB — CBC
HCT: 31.2 % — ABNORMAL LOW (ref 39.0–52.0)
Hemoglobin: 10.8 g/dL — ABNORMAL LOW (ref 13.0–17.0)
MCH: 30.9 pg (ref 26.0–34.0)
MCHC: 34.6 g/dL (ref 30.0–36.0)
MCV: 89.4 fL (ref 80.0–100.0)
Platelets: 200 10*3/uL (ref 150–400)
RBC: 3.49 MIL/uL — ABNORMAL LOW (ref 4.22–5.81)
RDW: 13.5 % (ref 11.5–15.5)
WBC: 5.6 10*3/uL (ref 4.0–10.5)
nRBC: 0 % (ref 0.0–0.2)

## 2021-06-06 LAB — GLUCOSE, CAPILLARY
Glucose-Capillary: 140 mg/dL — ABNORMAL HIGH (ref 70–99)
Glucose-Capillary: 240 mg/dL — ABNORMAL HIGH (ref 70–99)
Glucose-Capillary: 85 mg/dL (ref 70–99)
Glucose-Capillary: 207 mg/dL — ABNORMAL HIGH (ref 70–99)

## 2021-06-06 IMAGING — US US ABDOMEN LIMITED
1 series · 14 of 25 positions shown · non-contrast
Comparison: Abdominal ultrasound [DATE]

CLINICAL DATA: Right upper quadrant fullness

EXAM:
ULTRASOUND ABDOMEN LIMITED RIGHT UPPER QUADRANT

[Series 1: us abdomen complete · 14 of 36 slices shown]
[im 1/36]
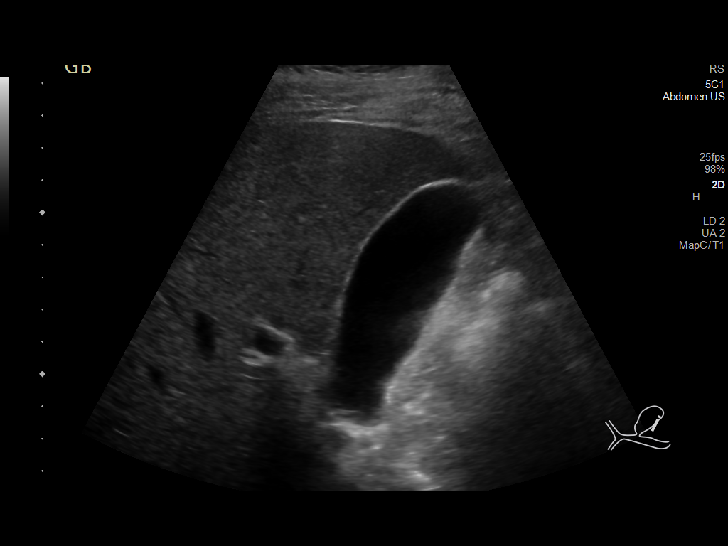
[im 3/36]
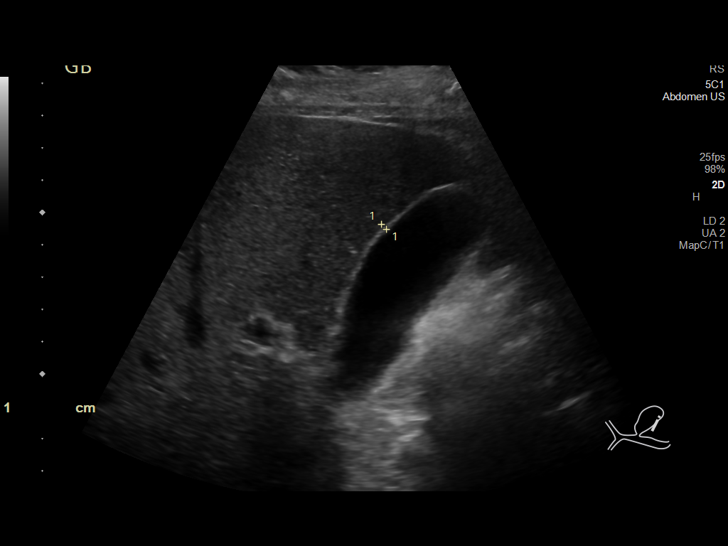
[im 6/36]
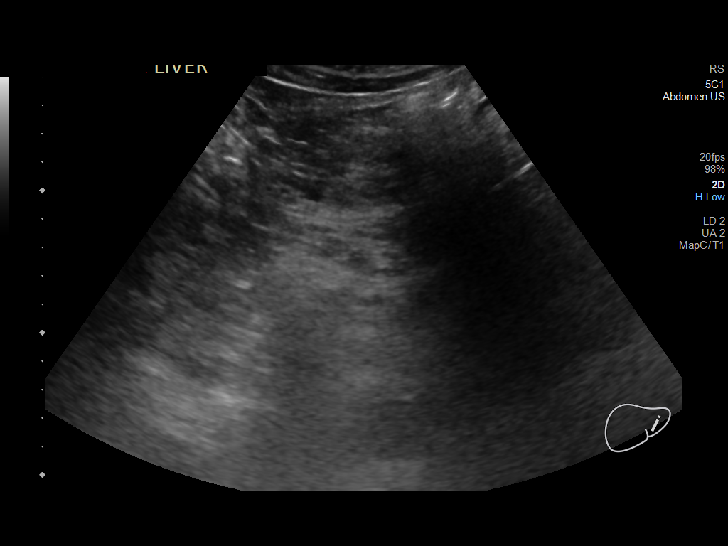
[im 9/36]
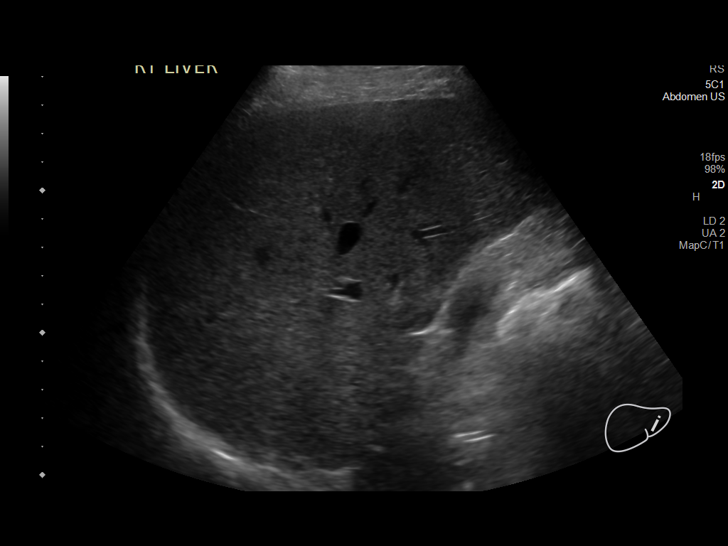
[im 12/36]
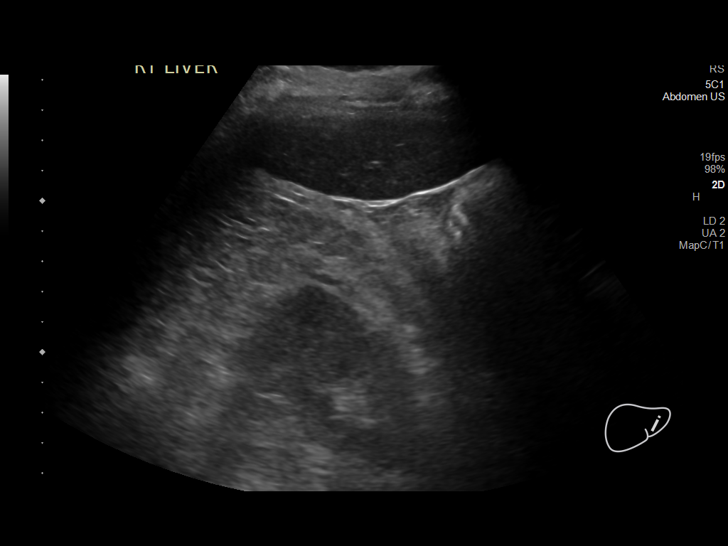
[im 14/36]
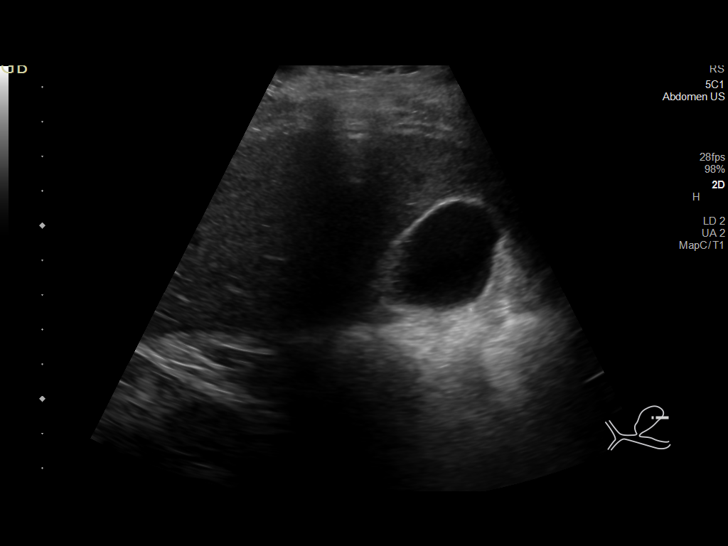
[im 17/36]
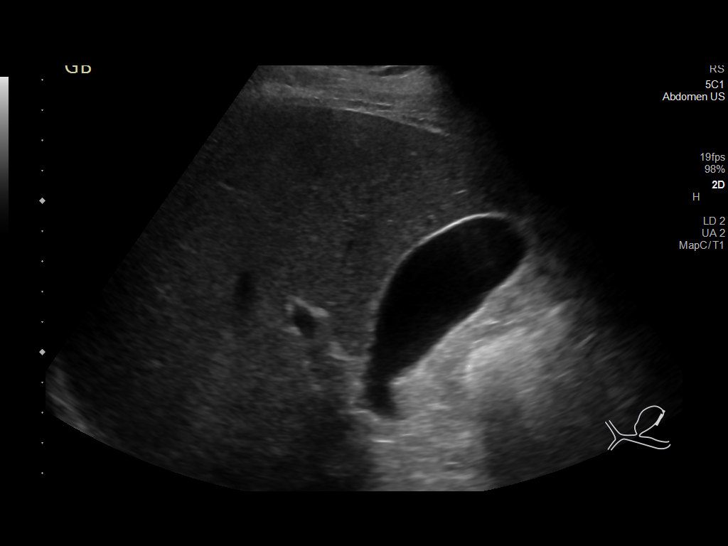
[im 19/36]
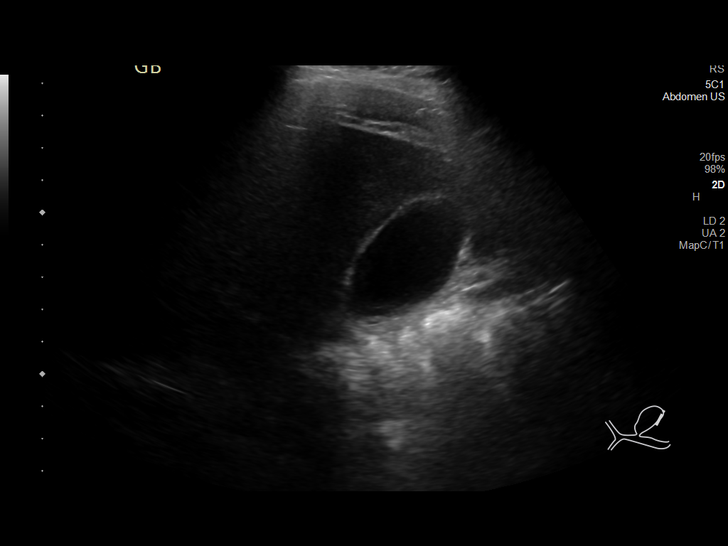
[im 22/36]
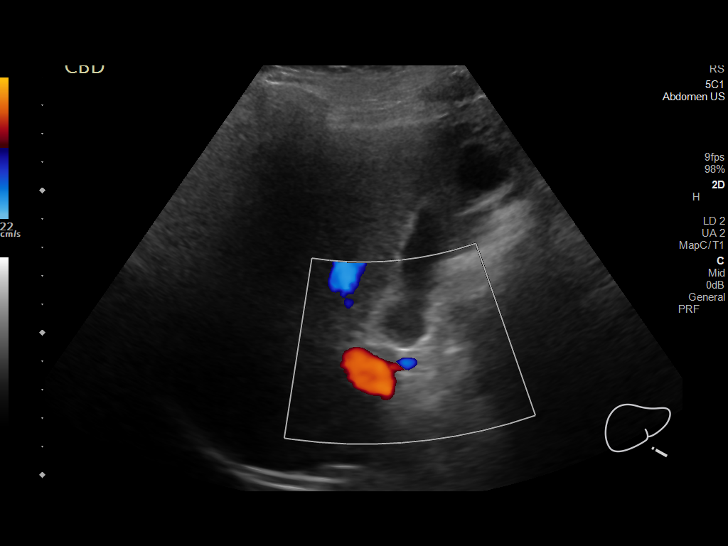
[im 24/36]
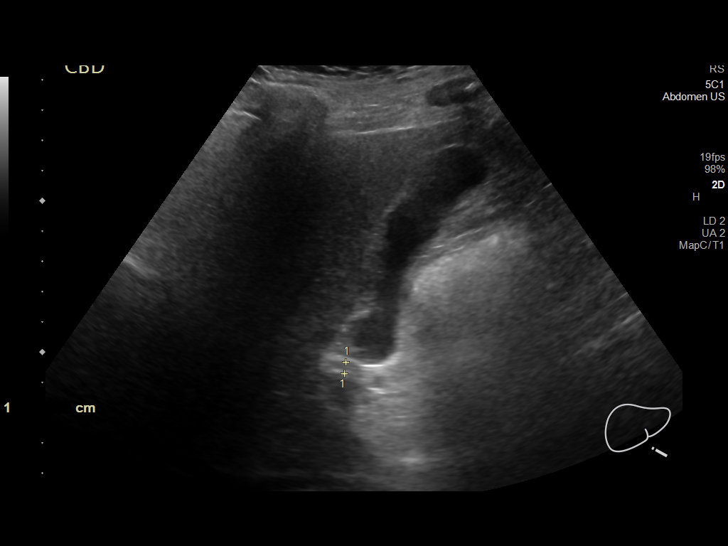
[im 27/36]
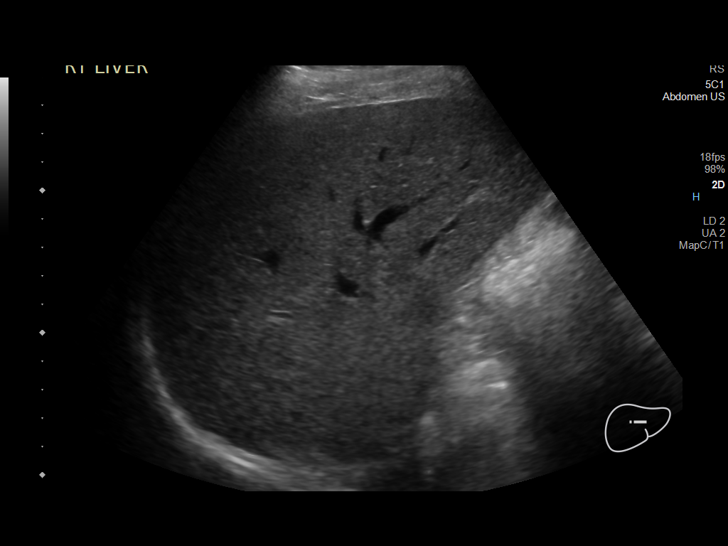
[im 30/36]
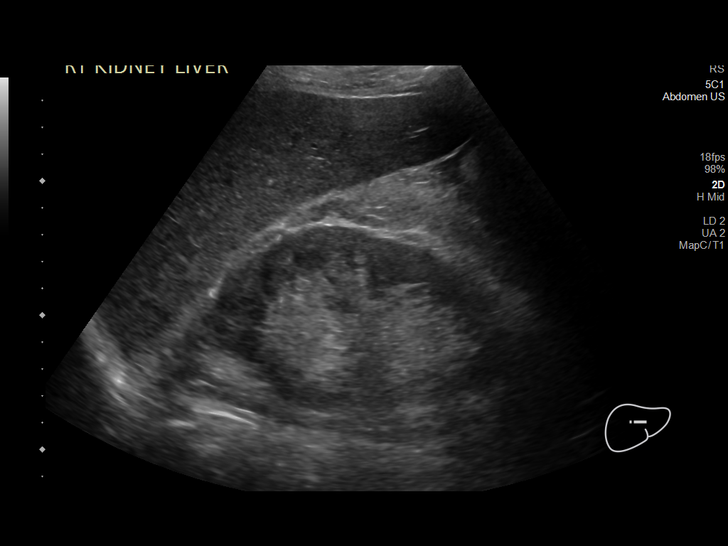
[im 33/36]
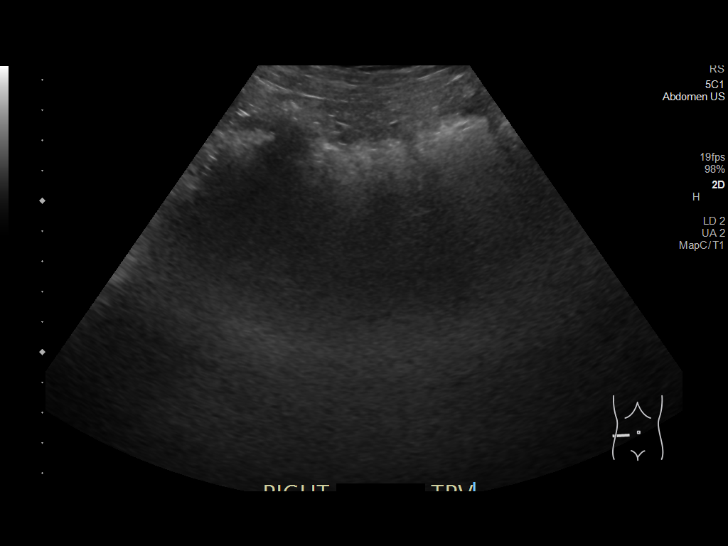
[im 36/36]
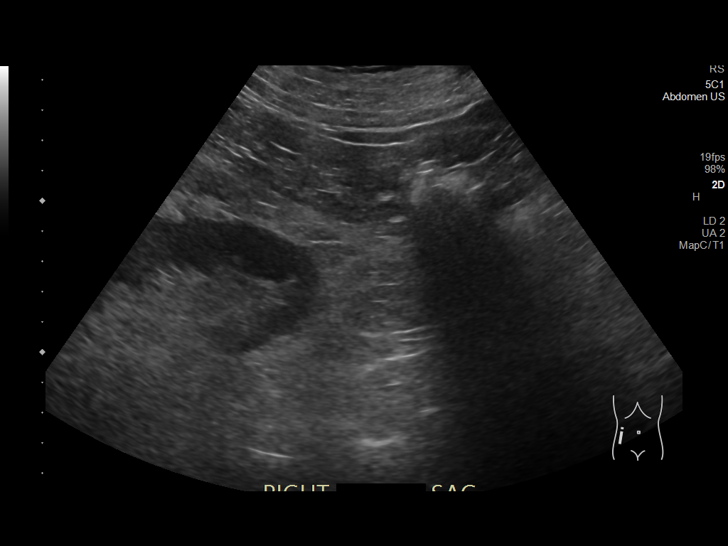

[14 of 25 positions shown; findings below may reference images not displayed]

FINDINGS: Gallbladder:

No gallstones or wall thickening visualized. No sonographic Murphy
sign noted by sonographer.

Common bile duct:

Diameter: 4 mm

Liver:

Left lobe not visualized due to bowel gas. Otherwise
normal-appearing parenchymal echogenicity with no focal mass
identified. Portal vein is patent on color Doppler imaging with
normal direction of blood flow towards the liver.

Other: None.
IMPRESSION: No acute process identified.

## 2021-06-06 MED ORDER — HYDROCORTISONE 1 % EX CREA
1.0000 "application " | TOPICAL_CREAM | Freq: Two times a day (BID) | CUTANEOUS | Status: DC
  Administered 2021-06-06 – 2021-06-11 (×7): 1 via TOPICAL
  Filled 2021-06-06: qty 28

## 2021-06-06 MED ORDER — OXYCODONE HCL 5 MG PO TABS
5.0000 mg | ORAL_TABLET | ORAL | Status: DC | PRN
  Administered 2021-06-06 – 2021-06-11 (×18): 5 mg via ORAL
  Filled 2021-06-06 (×19): qty 1

## 2021-06-06 NOTE — Consult Note (Signed)
[facility] Nurse Consult Note: ?Reason for Consult:NPWT (Prevena) was placed by neurosurgery this AM.  Due to redness, weeping periwound skin, itching and irritation, the NPWT dressing came off.  I am going to treat the medical adhesive related skin injury (MARSI) to this area and attempt to replace NPWT.  Incision line has been weeping as well.   ?Wound type:surgical incision ?Pressure Injury POA: NA ?Measurement: incision line to lumbar spine, closed but oozing serous fluid.  ?Wound bed:NA ?Drainage (amount, consistency, odor) Moderate periwound weeping due to full thickness tissue loss.  Surgical incision to lumbar spine with oozing effluent noted.  ?Periwound:Erythema, with full thickness tissue loss ?Dressing procedure/placement/frequency: Periwound skin treated with spray steroid, triamcinolone.  Allowed to dry.  Skin then protected with skin prep and barrier strips applied to periwound skin.  NPWT and drape applied.  Suction immediately achieved with Prevena device. No itching at this time.  ?Will not follow at this time.  Please re-consult if needed.  ?Domenic Moras MSN, RN, FNP-BC CWON ?Wound, Ostomy, Continence Nurse ?Pager [phone]  ?  ?

## 2021-06-06 NOTE — Consult Note (Signed)
[place] Nurse wound follow up ?Approximately an hour after replacing Prevena and treating periwound skin, patient complains of itching and barrier starting to peel.  I remove the barrier strips and leaking Prevena and cleanse gently.  He has used silver alginate in the past.   ?I apply stoma powder to weeping skin to absorb moisture.  I seal this in with skin prep and the periwound skin is dry.  I apply a strip of Aquacel to the incision line (4 cm long) and cover with honeycomb dressing. Should be changed every other day or when leaking.  ?Will not follow at this time.  Please re-consult if needed.  ?Domenic Moras MSN, RN, FNP-BC CWON ?Wound, Ostomy, Continence Nurse ?Pager [phone]  ?  ?

## 2021-06-06 NOTE — TOC Progression Note (Addendum)
Transition of Care (TOC) - Progression Note  ? ? ?Patient Details  ?Name: Braelin Costlow ?MRN: [id number] ?Date of Birth: 11-26-1955 ? ?Transition of Care (TOC) CM/SW Contact  ?Marilu Favre, RN ?Phone Number: ?06/06/2021, 10:15 AM ? ?Clinical Narrative:    ? ?WOC placed a Prevena incisional VAC . Nurse can send [place] incisional [place] home with patient.  ? ?Await antibiotic determination.  ? ?NCM went to speak with patient at bedside. Patient currently receiving care from staff.  ? ?[facility] patient does not have a PCP with the [place].   ? ?1635 Spoke to patient at bedside. Confirmed face sheet information address and phone number. Patient from home with girlfriend.  ? ?PCP is at [place] in [place], patient unsure of name, but consented for NCM to call [facility] to get information. [facility] currently closed will call in morning  ?  ? ?Expected Discharge Plan and Services ?  ?  ?  ?  ?  ?                ?  ?  ?  ?  ?  ?  ?  ?  ?  ?  ? ? ?Social Determinants of Health (SDOH) Interventions ?  ? ?Readmission Risk Interventions ?   ? View : No data to display.  ?  ?  ?  ? ? ?

## 2021-06-06 NOTE — Consult Note (Signed)
WOC Nurse Consult Note: ?Reason for Consult:Lumbar incisional wound with Prevena incisional VAC placed. Patient is having intense itching around the periwound. Topical steroid cream has been ordered.  ?Wound type:surgical, infectious ?Pressure Injury POA: /NA ?Measurement:Incisional VAC in place ?Wound bed:NA ?Drainage (amount, consistency, odor) None noted ?Periwound:Erythema, itching and warmth ?Dressing procedure/placement/frequency: Neurosurgery has ordered topical steroid cream.  No further WOC needs at this time. Patient anticipates discharging tomorrow, he reports  ?Will not follow at this time.  Please re-consult if needed.  ?Domenic Moras MSN, RN, FNP-BC CWON ?Wound, Ostomy, Continence Nurse ?Pager [phone]  ?

## 2021-06-06 NOTE — Progress Notes (Signed)
Pharmacy Antibiotic Note ? ?Danny Hampton is a 66 y.o. adult admitted on 06/05/2021 presenting with osteomyelitis, worsening wound drainage s/p I&D, hardware involvement, hx of MSSA bacteremia. ID narrowed to Cefazolin. Pharmacy consulted for dosing.  ? ?Renal function stable. WBC within normal limits. Afebrile.  ? ?Plan: ?Continue Cefazolin 2g IV every 8 hours.  ?Pharmacy will sign off consult - please re-consult if needed.  ? ?Height: '5\' 5"'$  (165.1 cm) ?Weight: 89.8 kg (197 lb 15.9 oz) ?IBW/kg (Calculated) : 61.5 ? ?Temp (24hrs), Avg:98.1 ?F (36.7 ?C), Min:98 ?F (36.7 ?C), Max:98.3 ?F (36.8 ?C) ? ?Recent Labs  ?Lab 06/05/21 ?1635 06/06/21 ?5170  ?WBC 7.8 5.6  ?CREATININE 0.69 0.68  ?  ?Estimated Creatinine Clearance (by C-G formula based on SCr of 0.68 mg/dL) ?Male: 77.6 mL/min ?Male: 94.8 mL/min   ? ?Allergies  ?Allergen Reactions  ? Gabapentin Anxiety  ? ? ?Sloan Leiter, PharmD ?Clinical Pharmacist ?Please refer to [facility] for [place] numbers ?06/06/2021 9:07 AM ? ? ?

## 2021-06-06 NOTE — Progress Notes (Signed)
Patient doing ok this morning. Having a lot of itching on his back. Does report some drainage overnight. Reports mild to moderate back pain but no leg pain. I did place a wound vac on him this morning. Ordered topical steroid cream around incision as needed for itching. Will also adjust his pain medications  ?

## 2021-06-06 NOTE — Consult Note (Signed)
?   ? ? ? ? ?[facility]   ? ?Date of Admission:  06/05/2021    ? ?Total days of antibiotics  ? Cefazolin 9/18 - 11/02 ? PO Cefadroxil >> cephalexin BID 11/02 through admission  ?        ?      ?Reason for Consult: Vertebral infection, MSSA     ?Referring Provider: Continuity patient Danny Hampton) ?Primary Care Provider: System, Provider Not In  ? ? ? ?Assessment: ?Danny Hampton is a 66 y.o. adult admitted to the hospital with concern over worsening drainage and back pain in the setting of known  S/P IV Cefazolin 9/18 - 11/02 with ongoing PO cefadroxil 1gm bid for suppression given hardware in place. This was transitioned to cephalexin 2000 mg BID at one point from the [place] team / pharmacy.  ? ?Following original fusion in September 2022 he was admitted for MSSA bacteremia requiring a I&D of spine. He has had a long standing open wound that was draining back in November 2022. At follow up in early February 2023 he reported increased drainage x 2 weeks from back wound; CT scan Jan 27 showed lucency along T10 transpedicular screws concerning for loosening.  Went to the hospital 3/14 because he was not feeling well. He was taken to OR for a second debridement of thoracic wound and removal of screws at T10 & T11; clear fluid noted coming up around the T10 screws b/l. The hardware was cultured and did not grow anything.  There was a superficial back culture that grew out rare staph haemolyticus (R-oxacillin) --> suspect this was superficial skin contamination from drainage sampling; if this were pathogenic would have expected it to grow from at least one of the 3 surgical cultures taken on 3/15. ? ?Presently, he was admitted for consideration of further IV antibiotics and possible I&D / wound vac application. On exam today he has a notable soft tissue defect at the superior aspect of T spine incision that seems to be larger than previously captured in photo images. His incision site is approximated  with exception of pinhole draining clear fluid. Surrounding skin with pretty vibrant erythema and flaking skin - he states the skin has been so itchy he has had his girlfriend put rubbing alcohol on it, likely contributing to the current appearance of the peri wound erythema. On thoracic CT scan 3/15 there was mention of concern of new paraspinal soft tissue inflammation at T10 but also findings that argue against discitis --> will get MR of T-spine with and without to further characterize any possible deep infection or possible abscess pocket that has formed.  ? ?Appreciate wound care assistance with surrounding moisture/medical device associated skin damage / dermatitis. Will continue IV cefazolin. Blood cultures pending, overall stable hemodynamics.  ? ? ? ?Plan: ?T spine MRI w/ and w/o to assess for deeper vertebral infection  ?Continue IV cefazolin   ?Follow pending blood cultures  ? ? ?Principal Problem: ?  Hardware complicating wound infection ([place]) ?Active Problems: ?  GERD (gastroesophageal reflux disease) ?  Primary hypertension ?  Hyperlipidemia ?  Controlled type 2 diabetes mellitus without complication, without long-term current use of insulin ([place]) ?  Restless legs syndrome ?  Anxiety and depression ?  Abdominal mass, RLQ (right lower quadrant) ? ? ? atorvastatin  40 mg Oral Daily  ? Carbidopa-Levodopa ER  2 tablet Oral BID  ? hydrocortisone cream  1 application. Topical BID  ? insulin aspart  0-9  Units Subcutaneous TID WC  ? lisinopril  10 mg Oral Daily  ? loratadine  10 mg Oral Daily  ? LORazepam  1 mg Oral TID  ? metoprolol succinate  25 mg Oral Daily  ? multivitamin with minerals  1 tablet Oral Daily  ? pantoprazole  40 mg Oral Daily  ? PARoxetine  10 mg Oral BID  ? phenytoin  100 mg Oral TID  ? rOPINIRole  0.5 mg Oral q1800  ? rOPINIRole  1 mg Oral QHS  ? sodium chloride flush  3 mL Intravenous Q12H  ? thiamine  100 mg Oral Daily  ? ? ?HPI: Danny Hampton is a 66 y.o. adult admitted to the hospital  after FU with ID clinic with ongoing drainage from thoracic spine wound.  ? ?Initially presented with MSSA bacteremia in September 16th, 2022 following posterior lumbar spine surgery October 31, 2020. Underwent wound exploration with I&D on 11/27/2020. TEE was negative. Completed IV cefazolin through 11/02 with transition to oral antibiotics at that time. He describes some difficult with "the wrong antibiotic" from what [facility] vs [facility] prescribed (Cefadroxil vs cephalexin). He states his back pain is "intolerable." The itching from around his back wound has been very bothersome for him. He has had his wife putting vagsil and rubbing alcohol on the wound and around it topically. No fevers or chills to his knowledge.  ? ?He expresses concern that there is discordance between treating teams and plan for treatment of this infection.  ? ? ?Review of Systems: ?Review of Systems  ?Constitutional:  Negative for chills, fever, malaise/fatigue and weight loss.  ?HENT:  Negative for sore throat.   ?Respiratory:  Negative for cough, sputum production and shortness of breath.   ?Cardiovascular: Negative.   ?Gastrointestinal:  Negative for abdominal pain, diarrhea and vomiting.  ?Musculoskeletal:  Positive for back pain. Negative for joint pain, myalgias and neck pain.  ?Skin:  Positive for itching and rash.  ?     Clear fluid draining from wound  ?Neurological:  Negative for headaches.  ?Psychiatric/Behavioral:  Negative for depression and substance abuse. The patient is not nervous/anxious.   ? ?Past Medical History:  ?Diagnosis Date  ? Acid reflux   ? Anxiety   ? Arrhythmia   ? paroxysmal supreventricular tachycardia  ? Arthritis   ? Complication of anesthesia   ? Myotonia congenita  ? Dysrhythmia   ? Essential hypertension   ? Family history of adverse reaction to anesthesia   ? Sister  ? Hardware complicating wound infection ([place]) 04/11/2021  ? Hernia, abdominal 06/05/2021  ? High cholesterol   ? History of skin cancer   ? Myotonia  congenita   ? Type 2 diabetes mellitus ([place])   ? ? ?Social History  ? ?Tobacco Use  ? Smoking status: Former  ?  Packs/day: 1.00  ?  Years: 30.00  ?  Pack years: 30.00  ?  Types: Cigarettes  ?  Quit date: 03/2018  ?  Years since quitting: 3.2  ? Smokeless tobacco: Never  ?Vaping Use  ? Vaping Use: Never used  ?Substance Use Topics  ? Alcohol use: Not Currently  ? Drug use: Yes  ?  Types: Marijuana  ?  Comment: weekly  ? ? ?Family History  ?Problem Relation Age of Onset  ? Healthy Mother   ? Healthy Father   ? Cancer Father   ? ?Allergies  ?Allergen Reactions  ? Gabapentin Anxiety  ? ? ?OBJECTIVE: ?Blood pressure 111/81, pulse (!) 108, temperature  98 ?F (36.7 ?C), temperature source Oral, resp. rate 19, height '5\' 5"'$  (1.651 m), weight 89.8 kg, SpO2 95 %. ? ?Physical Exam ?Constitutional:   ?   Appearance: Normal appearance.  ?   Comments: Sitting on the side of the bed in no distress. Frustrated   ?Cardiovascular:  ?   Rate and Rhythm: Normal rate.  ?Pulmonary:  ?   Effort: Pulmonary effort is normal.  ?   Breath sounds: Normal breath sounds.  ?Abdominal:  ?   Palpations: Abdomen is soft.  ?Skin: ?   General: Skin is warm and dry.  ?   Findings: Rash present.  ?   Comments: See photo -  ?Flaking erythematous edema encircling Tspine wound with partial thickness skin loss. Firm defect to superior incision.   ?Neurological:  ?   Mental Status: He is alert and oriented to person, place, and time.  ? ? ? ? ? ?Lab Results ?Lab Results  ?Component Value Date  ? WBC 5.6 06/06/2021  ? HGB 10.8 (L) 06/06/2021  ? HCT 31.2 (L) 06/06/2021  ? MCV 89.4 06/06/2021  ? PLT 200 06/06/2021  ?  ?Lab Results  ?Component Value Date  ? CREATININE 0.68 06/06/2021  ? BUN 12 06/06/2021  ? NA 133 (L) 06/06/2021  ? K 3.8 06/06/2021  ? CL 96 (L) 06/06/2021  ? CO2 24 06/06/2021  ?  ?Lab Results  ?Component Value Date  ? ALT 9 06/05/2021  ? AST 22 06/05/2021  ? ALKPHOS 71 06/05/2021  ? BILITOT 0.7 06/05/2021  ?  ? ?Microbiology: ?Recent Results  (from the past 240 hour(s))  ?Culture, blood (routine x 2)     Status: None (Preliminary result)  ? Collection Time: 06/05/21  4:35 PM  ? Specimen: BLOOD  ?Result Value Ref Range Status  ? Specimen Descriptio

## 2021-06-06 NOTE — Progress Notes (Signed)
?Progress note ? ?Patient: Danny Hampton [id number] DOB: May 02, 1955 ?DOA: 06/05/2021 ?DOS: the patient was seen and examined on 06/06/2021 ?PCP: System, Provider Not In  ?Patient coming from:  ID clinic  - lives with his girlfriend  ? ? ?Chief Complaint: "worsening drainage from spine/pain/swelling" ? ?HPI: Danny Hampton is a 66 y.o. adult with medical history significant of   ?HTN, HLD, T2DM, myotonia congentis, multiple surgeries on the spine most recently in August 2022 with fixation of U72-Z3 complicated by postoperative wound infection requiring debridement, sepsis, MSSA bacteremia, recently discharged on 2 weeks of antibiotics on 05/25/2022 but continues to have worsening drainage from his back. He is s/p I&D two weeks ago by neurosurgery as well. He was seen in ID clinic by Dr. [name] today who sent him to hospital due to pus. He denies any fever/chills. He has had drainage since January/February. His back has become more swollen, tender, red over the past 2 weeks. Drainage remains purulent.  ? ?No fever/chills,  no chest pain or palpitations, no shortness of breath or cough, stomach pain, N/V, has had diarrhea daily, no dysuria or leg swelling.  ? ?Vitals on admission:  afebrile, bp: 114/78, HR: 84, RR: 17, oxygen: 96%RA ? ?Subjective ?Patient complaining of fullness to his right upper quadrant but no pain no nausea no vomiting no diarrhea ? ?Physical Exam: ?Vitals:  ? 06/05/21 1957 06/06/21 0001 06/06/21 0511 06/06/21 0840  ?BP: 99/65 113/69 127/89 111/81  ?Pulse: 80 75 (!) 110 (!) 108  ?Resp: '17 17 17 19  '$ ?Temp: 98.3 ?F (36.8 ?C) 98 ?F (36.7 ?C) 98.2 ?F (36.8 ?C) 98 ?F (36.7 ?C)  ?TempSrc: Oral Oral Oral Oral  ?SpO2: 93% 97% 97% 95%  ?Weight:      ?Height:      ? ?General:  Appears calm and comfortable and is in NAD ?Eyes:  PERRL, EOMI, normal lids, iris ?ENT:  grossly normal hearing, lips & tongue, mmm; appropriate dentition ?Neck:  no LAD, masses or thyromegaly; no carotid bruits ?Cardiovascular:  RRR,  no m/r/g. No LE edema.  ?Respiratory:   CTA bilaterally with no wheezes/rales/rhonchi.  Normal respiratory effort. ?Abdomen:  soft, NT, ND, NABS. No appreciable mass palpated laying down  ?Back:   normal alignment, no CVAT ?Skin: Wound VAC to back ? ?Musculoskeletal:  grossly normal tone BUE/BLE, good ROM, no bony abnormality ?Lower extremity:  No LE edema.  Limited foot exam with no ulcerations.  2+ distal pulses. ?Psychiatric:  grossly normal mood and affect, speech fluent and appropriate, AOx3 ?Neurologic:  CN 2-12 grossly intact, moves all extremities in coordinated fashion, sensation intact ? ? ?Radiological Exams on Admission: ?Independently reviewed - see discussion in A/P where applicable ? ?No results found. ? ? ?Labs on Admission: I have personally reviewed the available labs and imaging studies at the time of the admission. ? ? ? ? ?Assessment and Plan: ?* Hardware complicating wound infection ([place]) ?66 year old male with history of multiple surgeries on the spine most recently in August 2022 with fixation of G64-Q0 complicated by postoperative wound infection requiring debridement, sepsis, MSSA bacteremia s/p I&D 2 weeks ago presenting with worsening edema, redness and drainage from operative site despite being on antibiotics.  ?-admit to med surg ?-ID consulted-vanc/rocephin for now. Has been on cefadroxil after completing cefazolin  ?-Neurosurgery, Dr Saintclair Halsted following. Recommend wound vac placed ?-blood cultures, inflammatory markers, cbc pending  ?-hold ASA for procedures  ?-pain control with oral oxycodone and IV morphine for severe pain  ? ?  Controlled type 2 diabetes mellitus without complication, without long-term current use of insulin ([place]) ?A1c of 5.8 in 05/2021 ?Well controlled, hold metformin while inpatient ?SSI and accuchecks per protocol  ? ?Abdominal mass, RLQ (right lower quadrant) ?Not appreciable on exam.  ?? Hernia ?Will continue to follow and imaging per day team  ? ?Primary  hypertension ?Continue lisinopril and metoprolol  ? ?Anxiety and depression ?Continue [paxil, ativan and hydroxyzine ?pmp website verified and fills/takes ativan '1mg'$  TID appropriately  ? ?GERD (gastroesophageal reflux disease) ?Continue protonix  ? ?Hyperlipidemia ?Continue lipitor  ? ?Restless legs syndrome ?Continue requip  ? ?Right upper quadrant fullness ?Obtain ultrasound ?LFTs normal ?Asymptomatic ? ? ? ?Advance Care Planning:   Code Status: Full Code  ? ?Consults: neurosurgery: Dr. Cram/ID Dr. Candiss Norse  ? ?DVT Prophylaxis: SCDs  ? ?Family Communication: none  ? ?Author: ?Lorah Kalina A, MD ?06/06/2021 1:48 PM ? ? ?

## 2021-06-06 NOTE — Hospital Course (Signed)
[place], [place]  ?Patient's [facility] doctor  ?Rare staph haemolyticus (resistant)  ?

## 2021-06-07 ENCOUNTER — Encounter (HOSPITAL_COMMUNITY): Payer: Self-pay | Admitting: Family Medicine

## 2021-06-07 ENCOUNTER — Inpatient Hospital Stay (HOSPITAL_COMMUNITY): Payer: No Typology Code available for payment source

## 2021-06-07 DIAGNOSIS — T847XXD Infection and inflammatory reaction due to other internal orthopedic prosthetic devices, implants and grafts, subsequent encounter: Secondary | ICD-10-CM | POA: Diagnosis not present

## 2021-06-07 LAB — GLUCOSE, CAPILLARY
Glucose-Capillary: 150 mg/dL — ABNORMAL HIGH (ref 70–99)
Glucose-Capillary: 147 mg/dL — ABNORMAL HIGH (ref 70–99)
Glucose-Capillary: 122 mg/dL — ABNORMAL HIGH (ref 70–99)
Glucose-Capillary: 155 mg/dL — ABNORMAL HIGH (ref 70–99)

## 2021-06-07 IMAGING — MR MR THORACIC SPINE WO/W CM
4 of 8 series · 17 of 48 positions shown · IV contrast (gadavist)
Comparison: CT thoracolumbar spine [DATE] and [DATE].
Thoracic MRI [DATE].

CLINICAL DATA: Osteomyelitis, thoracic spine. Thoracolumbar fusion
[DATE] with lumbar wound debridement [DATE] and thoracic
wound debridement and hardware removal [DATE].

EXAM:
MRI THORACIC WITHOUT AND WITH CONTRAST
TECHNIQUE: Multiplanar and multiecho pulse sequences of the thoracic spine were
obtained without and with intravenous contrast.
CONTRAST:  8.9mL GADAVIST GADOBUTROL 1 MMOL/ML IV SOLN

[Series 5: T2 · sagittal · 3.0mm · 0.62mm/px · 3 of 15 slices shown (1 of 2)]
[im 1/15]
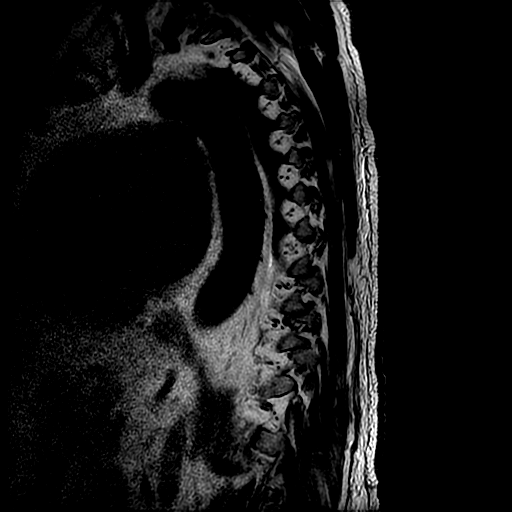
[im 8/15]
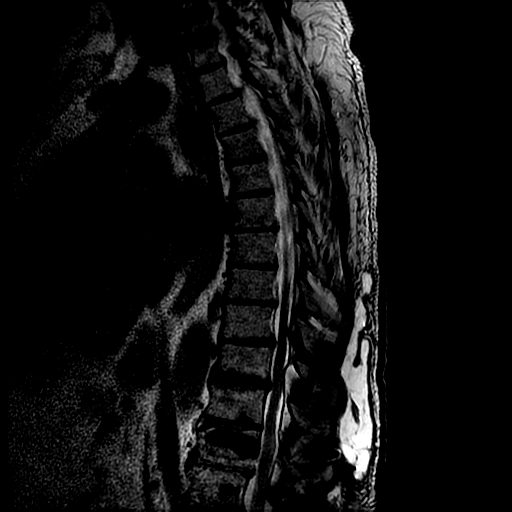
[im 15/15]
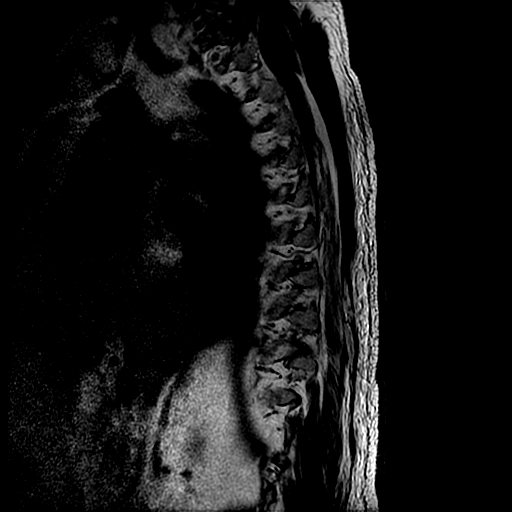

[Series 6: T1 · sagittal · 3.0mm · 0.62mm/px · 3 of 15 slices shown (1 of 2)]
[im 1/15]
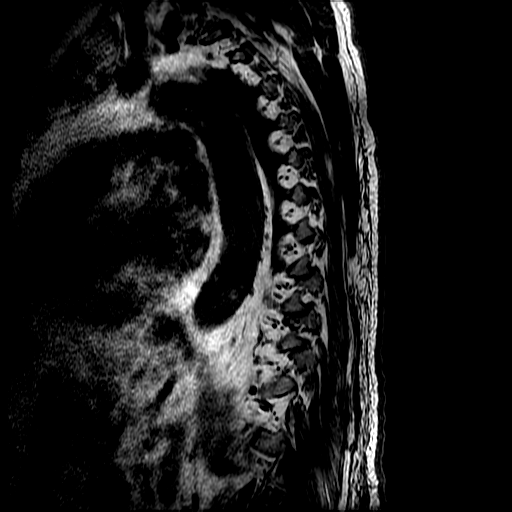
[im 8/15]
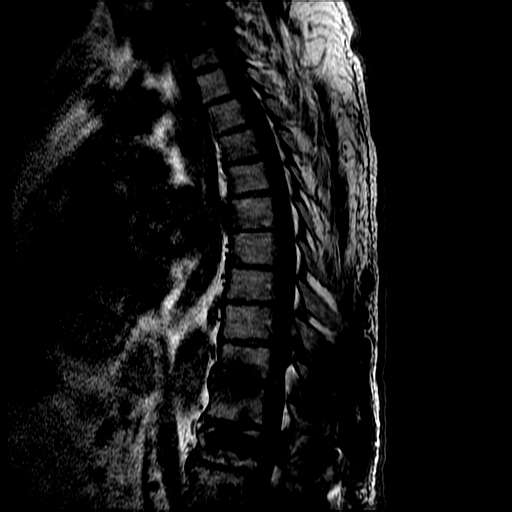
[im 15/15]
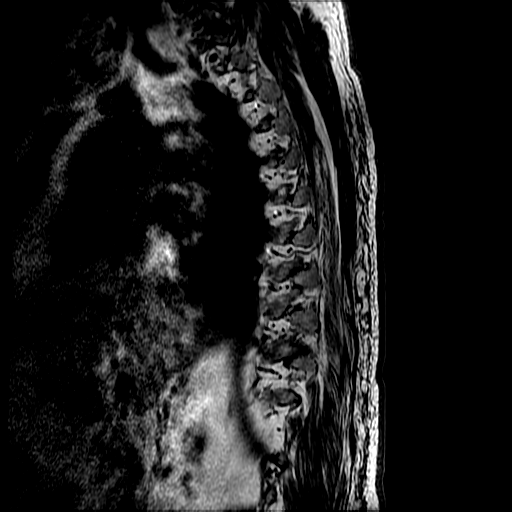

[Series 8: T2 · axial · 4.0mm · 0.43mm/px · z∈[-290,-87]mm · 8 of 42 slices shown (2 of 2)]
[im 1/42]
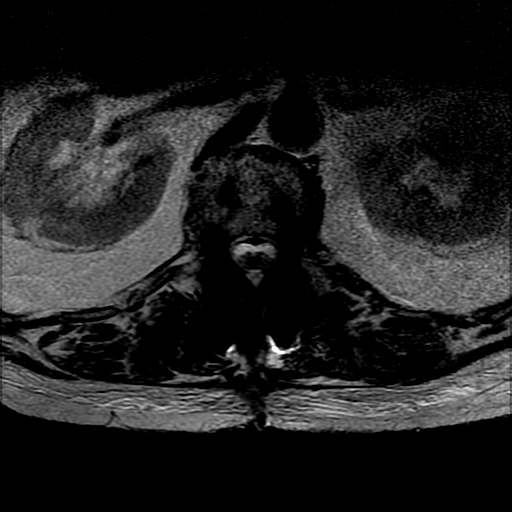
[im 6/42]
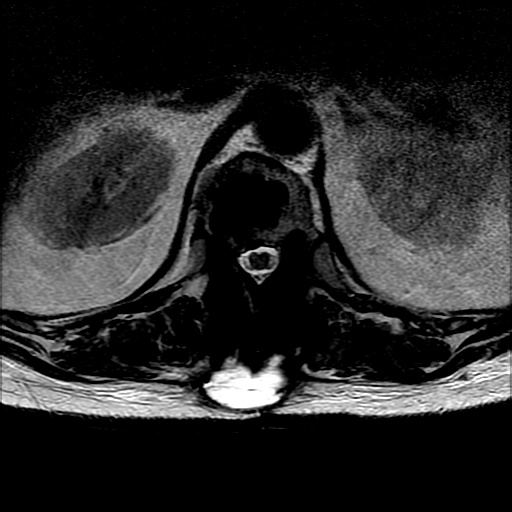
[im 11/42]
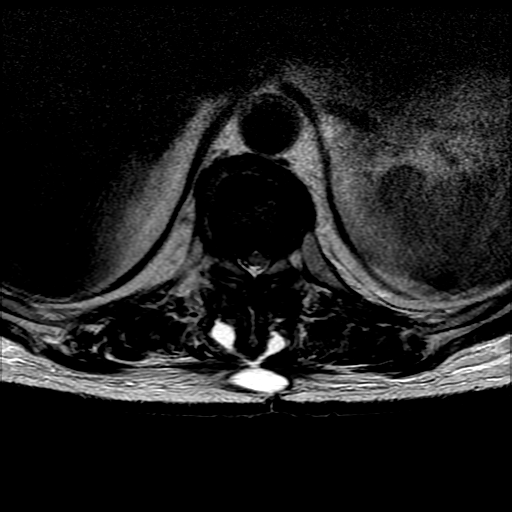
[im 16/42]
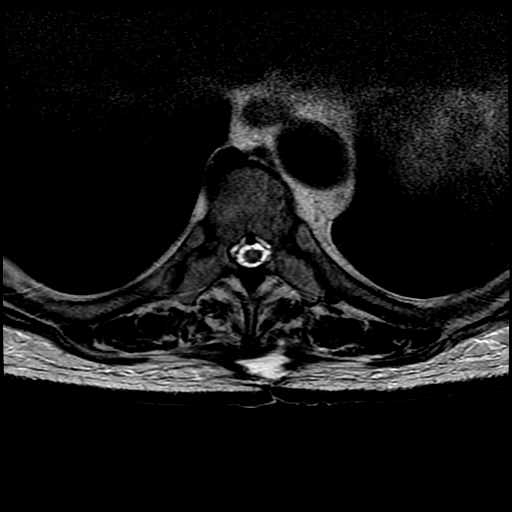
[im 21/42]
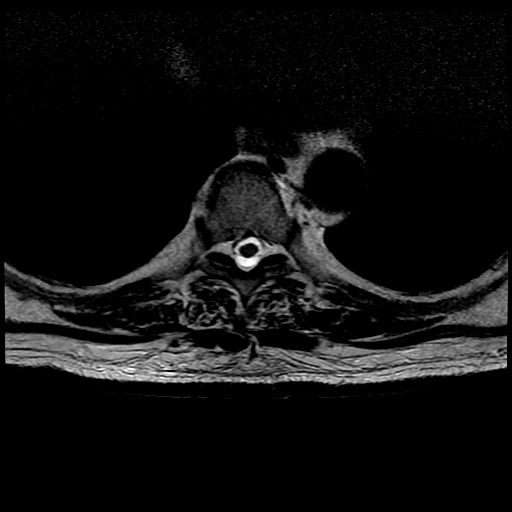
[im 26/42]
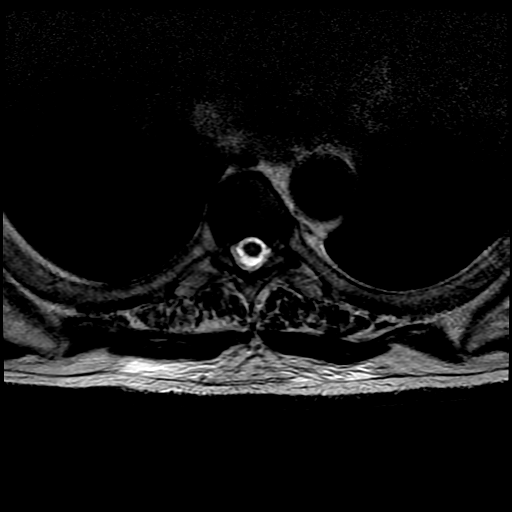
[im 31/42]
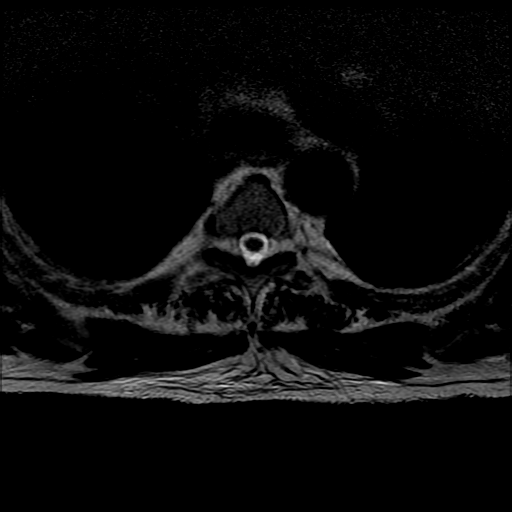
[im 36/42]
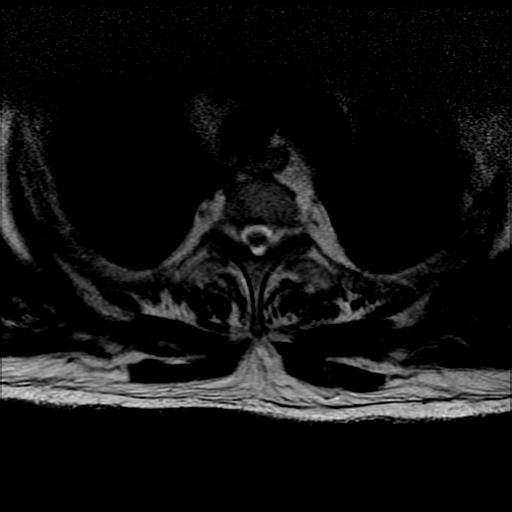

[Series 10: T1 · axial · non-contrast · 4.0mm · 0.39mm/px · z∈[-267,-85]mm · 3 of 42 slices shown (2 of 2)]
[im 6/42]
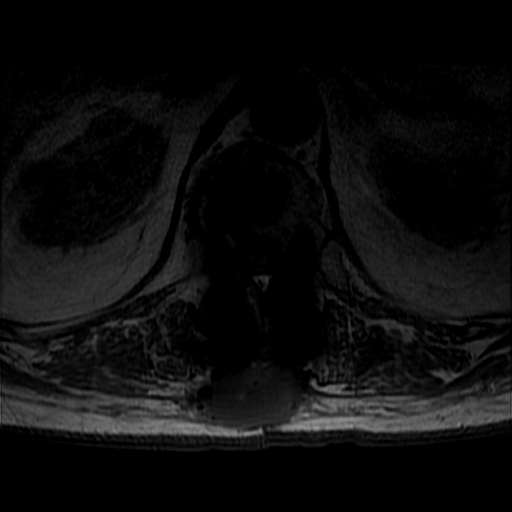
[im 21/42]
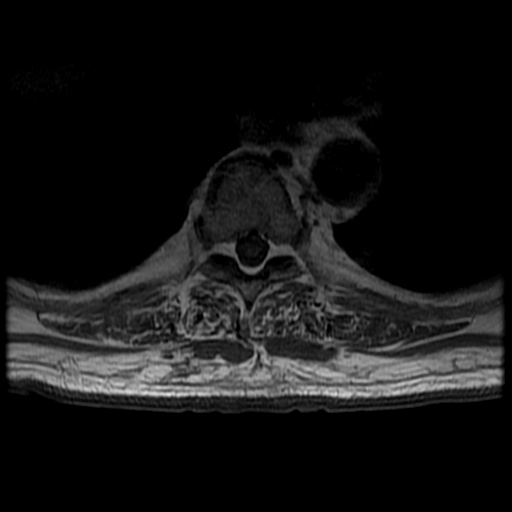
[im 36/42]
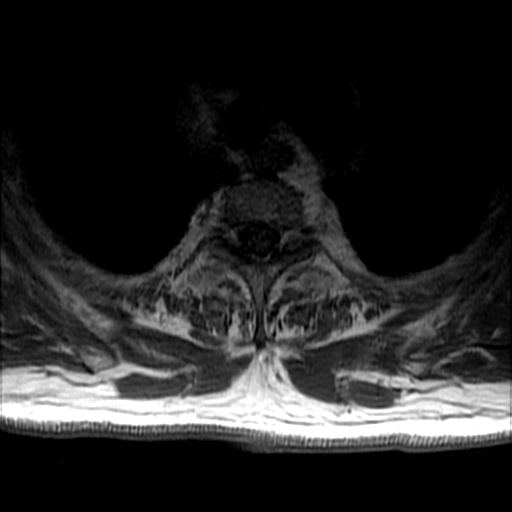

[17 of 48 positions shown; findings below may reference images not displayed]

FINDINGS: Despite efforts by the technologist and patient, mild motion
artifact is present on today's exam and could not be eliminated.
This reduces exam sensitivity and specificity.

Alignment:  Physiologic.

Vertebrae: Images extend from C6-7 through L1. Patient is status
post thoracolumbar fusion from T10 through the upper sacrum. Since
the previous CTs, the T10 and T11 pedicle screws and rods have been
removed. The T12 and L1 hardware remains in place. On the sagittal
T1 weighted images, the T9 and T10 spinous processes appear
irregular with decreased marrow signal. However, no definite
suspicious T2 signal abnormality or abnormal enhancement is seen in
these areas. There are chronic endplate degenerative changes at
T10-11 with endplate sclerosis, cyst formation and mild T2
hyperintensity, similar to previous CT. No endplate destruction or
discal hyperintensity is seen to suggest discitis/osteomyelitis.
Endplate degenerative changes are also present in the lower cervical
spine.

Cord: The thoracic cord appears normal in signal and caliber.No
abnormal intradural enhancement.

Paraspinal and other soft tissues: There is a complex peripherally
enhancing fluid collection posteriorly at the level of the removed
hardware. This measures up to 3.8 x 4.0 cm transverse on image 34/3.
This fluid collection measures approximately 12.9 cm in length,
extending superiorly to the T7-8 level. No anterior paraspinal or
epidural fluid collections are seen.

Disc levels:

Lower cervical spondylosis is noted with a right paracentral disc
extrusion demonstrating cranial migration at C7-T1. No resulting
cord deformity.

No significant upper thoracic disc space findings.

T7-8: Small right paracentral disc protrusion without cord deformity
or foraminal compromise.

T8-9: Disc bulging with endplate osteophytes contributing to mild
foraminal narrowing bilaterally. No cord deformity.

T9-10: Endplate osteophytes with bilateral facet hypertrophy
contribute to mild spinal stenosis and mild foraminal narrowing
bilaterally.

T10-11: Interval hardware removal as above. Chronic degenerative
disc disease with loss of disc height, endplate osteophytes and
vacuum phenomenon on previous CT. No signs of discitis or vertebral
body osteomyelitis. There is mild spinal stenosis and mild foraminal
narrowing bilaterally.

T11-12: Interval hardware removal.  No significant spinal stenosis.

T12-L1: Posterior hardware remains in place. Mild disc bulging and
endplate osteophytes without significant resulting spinal stenosis.
IMPRESSION: 1. Compared with previous CT of [DATE], the posterior hardware
has been removed at the T10 and T11 levels. Hardware extending
inferiorly from T12 remains in place, incompletely visualized.
2. No evidence of discitis or vertebral body osteomyelitis within
the thoracic spine. The spinous processes at T9 and T10 appear
somewhat irregular, and given the surrounding fluid collections
could reflect early osteomyelitis.
3. Peripherally enhancing fluid collection posteriorly in the
surgical bed extending from approximately T7-8 to T12-L1,
potentially infected. No evidence of anterior paraspinal or epidural
fluid collection.
4. Underlying multilevel spondylosis without high-grade spinal
stenosis.

## 2021-06-07 MED ORDER — GADOBUTROL 1 MMOL/ML IV SOLN
8.9000 mL | Freq: Once | INTRAVENOUS | Status: AC | PRN
  Administered 2021-06-07: 8.9 mL via INTRAVENOUS

## 2021-06-07 NOTE — Progress Notes (Signed)
?Progress note ? ?Patient: Danny Hampton OVF:[id number] DOB: January 22, 1956 ?DOA: 06/05/2021 ?DOS: the patient was seen and examined on 06/07/2021 ?PCP: System, Provider Not In  ?Patient coming from:  ID clinic  - lives with his girlfriend  ? ? ?Chief Complaint: "worsening drainage from spine/pain/swelling" ? ?HPI: Danny Hampton is a 66 y.o. adult with medical history significant of   ?HTN, HLD, T2DM, myotonia congentis, multiple surgeries on the spine most recently in August 2022 with fixation of A41-Y6 complicated by postoperative wound infection requiring debridement, sepsis, MSSA bacteremia, recently discharged on 2 weeks of antibiotics on 05/25/2022 but continues to have worsening drainage from his back. He is s/p I&D two weeks ago by neurosurgery as well. He was seen in ID clinic by Dr. [name] today who sent him to hospital due to pus. He denies any fever/chills. He has had drainage since January/February. His back has become more swollen, tender, red over the past 2 weeks. Drainage remains purulent.  ? ?No fever/chills,  no chest pain or palpitations, no shortness of breath or cough, stomach pain, N/V, has had diarrhea daily, no dysuria or leg swelling.  ? ?Vitals on admission:  afebrile, bp: 114/78, HR: 84, RR: 17, oxygen: 96%RA ? ?Subjective ?Has many questions all answered also with Dr. Saintclair Halsted neurosurgeon at the bedside ? ?Physical Exam: ?Vitals:  ? 06/06/21 1642 06/06/21 2001 06/07/21 0410 06/07/21 0737  ?BP: 107/73 127/77 115/77 123/77  ?Pulse: 89 90 87 82  ?Resp: '20 19 18 18  '$ ?Temp: 98.2 ?F (36.8 ?C) 98.4 ?F (36.9 ?C) 98.6 ?F (37 ?C) 98.7 ?F (37.1 ?C)  ?TempSrc:  Oral Oral Oral  ?SpO2: 96% 95% 96% 94%  ?Weight:      ?Height:      ? ?General:  Appears calm and comfortable and is in NAD ?Eyes:  PERRL, EOMI, normal lids, iris ?ENT:  grossly normal hearing, lips & tongue, mmm; appropriate dentition ?Neck:  no LAD, masses or thyromegaly; no carotid bruits ?Cardiovascular:  RRR, no m/r/g. No LE edema.  ?Respiratory:    CTA bilaterally with no wheezes/rales/rhonchi.  Normal respiratory effort. ?Abdomen:  soft, NT, ND, NABS. No appreciable mass palpated laying down  ?Back:   normal alignment, no CVAT ?Skin: Wound VAC to back ? ?Musculoskeletal:  grossly normal tone BUE/BLE, good ROM, no bony abnormality ?Lower extremity:  No LE edema.  Limited foot exam with no ulcerations.  2+ distal pulses. ?Psychiatric:  grossly normal mood and affect, speech fluent and appropriate, AOx3 ?Neurologic:  CN 2-12 grossly intact, moves all extremities in coordinated fashion, sensation intact ? ? ?Radiological Exams on Admission: ?Independently reviewed - see discussion in A/P where applicable ? ?US Abdomen Limited RUQ (LIVER/GB) ? ?Result Date: 06/06/2021 ?CLINICAL DATA:  Right upper quadrant fullness EXAM: ULTRASOUND ABDOMEN LIMITED RIGHT UPPER QUADRANT COMPARISON:  Abdominal ultrasound 11/27/2020 FINDINGS: Gallbladder: No gallstones or wall thickening visualized. No sonographic Murphy sign noted by sonographer. Common bile duct: Diameter: 4 mm Liver: Left lobe not visualized due to bowel gas. Otherwise normal-appearing parenchymal echogenicity with no focal mass identified. Portal vein is patent on color Doppler imaging with normal direction of blood flow towards the liver. Other: None. IMPRESSION: No acute process identified. Electronically Signed   By: Ofilia Neas M.D.   On: 06/06/2021 16:34   ? ? ?Labs on Admission: I have personally reviewed the available labs and imaging studies at the time of the admission. ? ? ? ? ?Assessment and Plan: ?* Hardware complicating wound infection ([place]) ?66 year old  male with history of multiple surgeries on the spine most recently in August 2022 with fixation of Y77-A1 complicated by postoperative wound infection requiring debridement, sepsis, MSSA bacteremia s/p I&D 2 weeks ago presenting with worsening edema, redness and drainage from operative site despite being on antibiotics.  ?-admit to med surg ?-ID  consulted-vanc/rocephin for now. Has been on cefadroxil after completing cefazolin  ?-Neurosurgery, Dr Saintclair Halsted following. Recommend wound vac placed ?-blood cultures, inflammatory markers, cbc pending  ?-hold ASA for procedures  ?-pain control with oral oxycodone and IV morphine for severe pain  ?-Proceed with MRI today ? ?Controlled type 2 diabetes mellitus without complication, without long-term current use of insulin ([place]) ?A1c of 5.8 in 05/2021 ?Well controlled, hold metformin while inpatient ?SSI and accuchecks per protocol  ? ?Abdominal mass, RLQ (right lower quadrant) ?Ultrasound normal shows gas ?LFTs normal ?Asymptomatic ? ?primary hypertension ?Continue lisinopril and metoprolol  ? ?Anxiety and depression ?Continue [paxil, ativan and hydroxyzine ?pmp website verified and fills/takes ativan '1mg'$  TID appropriately  ? ?GERD (gastroesophageal reflux disease) ?Continue protonix  ? ?Hyperlipidemia ?Continue lipitor  ? ?Restless legs syndrome ?Continue requip  ? ? ? ? ?Advance Care Planning:   Code Status: Full Code  ? ?Consults: neurosurgery: Dr. Cram/ID Dr. Candiss Norse  ? ?DVT Prophylaxis: SCDs  ? ?Family Communication: none  ? ?Author: ?Phillips Grout, MD ?06/07/2021 12:07 PM ? ? ?

## 2021-06-07 NOTE — Progress Notes (Signed)
Mobility Specialist Progress Note: ? ? 06/07/21 1123  ?Mobility  ?Activity Off unit  ?$Mobility charge 1 Mobility  ? ?Will follow-up as time allows. ? ?Stavros Cail ?Mobility Specialist ?Primary Phone [phone] ? ?

## 2021-06-07 NOTE — TOC Progression Note (Addendum)
Transition of Care (TOC) - Progression Note  ? ? ?Patient Details  ?Name: Danny Hampton ?MRN: [id number] ?Date of Birth: 1956/02/05 ? ?Transition of Care (TOC) CM/SW Contact  ?Marilu Favre, RN ?Phone Number: ?06/07/2021, 8:24 AM ? ?Clinical Narrative:    ? ?[facility]  [phone] .  ? ?Patient's PCP is Dr Osvaldo Angst. SW is Delia Chimes ( who is off on Fridays). ? ?[facility] told [facility] is not allowed to give out phone numbers or pagers numbers for PCP nurse or social worker. Was instructed to fax any home health orders along with clinicals to Dr Osvaldo Angst to [phone], MD will review and [facility] nurse will call NCM back.  ? ?NCM asked to be transferred to Dr Meltontate's nurse, NCM was transferred , however, no answer and no voicemail.  ? ?NCM faxed clinicals to Dr Osvaldo Angst to [phone], ? ?Once discharge plan determined NCM will fax discharge plan/orders to Dr Osvaldo Angst to [phone],at the [facility]   ? ?  ?  ? ?Expected Discharge Plan and Services ?  ?  ?  ?  ?  ?                ?  ?  ?  ?  ?  ?  ?  ?  ?  ?  ? ? ?Social Determinants of Health (SDOH) Interventions ?  ? ?Readmission Risk Interventions ?   ? View : No data to display.  ?  ?  ?  ? ? ?

## 2021-06-07 NOTE — Progress Notes (Signed)
?   ? ? ? ? ?[facility] ? ?Date of Admission:  06/05/2021    ? ? ?Total days of antibiotics 2  ? ?       ? ?ASSESSMENT: ?Danny Hampton is a 66 y.o. 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  ?Awaiting repeat MRI to determine if any worsening of deeper infection or abscess formation.  ?Danny Hampton is a little better on exam today with regards to pain and itching. Surrounding skin is quite scaled and c/w dermatitis. Topical steroids are helpful for him. Thin brown drainage on honeycomb dressing noted where defect is at superior incision.  ?Will continue with IV cefazolin while we wait further characterization of his problem with MRI.  ?NSGY team is following as well. Wound vac is not able to stay in place.  ? ? ?PLAN: ?FU MRI ?Continue IV Cefazolin  ? ? ?Dr. Juleen China will follow with the patient over the weekend.  ? ? ?Principal Problem: ?  Hardware complicating wound infection ([place]) ?Active Problems: ?  GERD (gastroesophageal reflux disease) ?  Primary hypertension ?  Hyperlipidemia ?  Controlled type 2 diabetes mellitus without complication, without long-term current use of insulin ([place]) ?  Restless legs syndrome ?  Anxiety and depression ?  Abdominal mass, RLQ (right lower quadrant) ? ? ? atorvastatin  40 mg Oral Daily  ? Carbidopa-Levodopa ER  2 tablet Oral BID  ? hydrocortisone cream  1 application. Topical BID  ? insulin aspart  0-9 Units Subcutaneous TID WC  ? lisinopril  10 mg Oral Daily  ? loratadine  10 mg Oral Daily  ? LORazepam  1 mg Oral TID  ? metoprolol succinate  25 mg Oral Daily  ? multivitamin with minerals  1 tablet Oral Daily  ? pantoprazole  40 mg Oral Daily  ? PARoxetine  10 mg Oral BID  ? phenytoin  100 mg Oral TID  ? rOPINIRole  0.5 mg Oral q1800  ? rOPINIRole  1 mg Oral QHS  ? sodium chloride flush  3 mL Intravenous Q12H  ? thiamine  100 mg Oral Daily  ? ? ?SUBJECTIVE: ?Feels like his back feels better today compared to yesterday. Topicals are  helping the itching modestly.  ?Wound vac has fallen off x 2.  ?Danny Hampton feels that the drainage is better while on IV antibiotics in the hospital - uncertain if it is mode of abx delivery or the fact Danny Hampton is moving around less while here vs at home.  ?No fevers/chills.  ? ? ?Review of Systems: ?Review of Systems  ?Constitutional:  Negative for chills and fever.  ?Respiratory:  Negative for cough and shortness of breath.   ?Cardiovascular:  Negative for chest pain and leg swelling.  ?Gastrointestinal:  Negative for abdominal pain, diarrhea, nausea and vomiting.  ?Genitourinary:  Negative for dysuria.  ?Musculoskeletal:  Positive for back pain (improved).  ?Skin:  Positive for itching and rash.  ?Neurological:  Negative for dizziness.  ?Psychiatric/Behavioral:  Negative for depression.   ? ? ?Allergies  ?Allergen Reactions  ? Gabapentin Anxiety  ? ? ?OBJECTIVE: ?Vitals:  ? 06/06/21 1642 06/06/21 2001 06/07/21 0410 06/07/21 0737  ?BP: 107/73 127/77 115/77 123/77  ?Pulse: 89 90 87 82  ?Resp: '20 19 18 18  '$ ?Temp: 98.2 ?F (36.8 ?C) 98.4 ?F (36.9 ?C) 98.6 ?F (37 ?C) 98.7 ?F (37.1 ?C)  ?TempSrc:  Oral Oral Oral  ?SpO2: 96% 95% 96% 94%  ?Weight:      ?Height:      ? ?  Body mass index is 32.95 kg/m?. ? ? ?Physical Exam ?HENT:  ?   Mouth/Throat:  ?   Mouth: Mucous membranes are moist.  ?   Pharynx: Oropharynx is clear.  ?Cardiovascular:  ?   Rate and Rhythm: Normal rate and regular rhythm.  ?Pulmonary:  ?   Effort: Pulmonary effort is normal.  ?Skin: ?   General: Skin is dry.  ?   Capillary Refill: Capillary refill takes less than 2 seconds.  ?   Comments: Scaled flaking rash to the surrounding incision. Honeycomb dressing in place with thin brown drainage. Less erythematous today with surrounding skin.   ?Neurological:  ?   Mental Status: Danny Hampton is alert and oriented to person, place, and time.  ? ? ?Lab Results ?Lab Results  ?Component Value Date  ? WBC 5.6 06/06/2021  ? HGB 10.8 (L) 06/06/2021  ? HCT 31.2 (L) 06/06/2021  ? MCV 89.4  06/06/2021  ? PLT 200 06/06/2021  ?  ?Lab Results  ?Component Value Date  ? CREATININE 0.68 06/06/2021  ? BUN 12 06/06/2021  ? NA 133 (L) 06/06/2021  ? K 3.8 06/06/2021  ? CL 96 (L) 06/06/2021  ? CO2 24 06/06/2021  ?  ?Lab Results  ?Component Value Date  ? ALT 9 06/05/2021  ? AST 22 06/05/2021  ? ALKPHOS 71 06/05/2021  ? BILITOT 0.7 06/05/2021  ?  ? ?Microbiology: ?Recent Results (from the past 240 hour(s))  ?Culture, blood (routine x 2)     Status: None (Preliminary result)  ? Collection Time: 06/05/21  4:35 PM  ? Specimen: BLOOD  ?Result Value Ref Range Status  ? Specimen Description BLOOD LEFT ANTECUBITAL  Final  ? Special Requests   Final  ?  BOTTLES DRAWN AEROBIC AND ANAEROBIC Blood Culture adequate volume  ? Culture   Final  ?  NO GROWTH 2 DAYS ?Performed at [facility], [place] [address], [place], [place] [address] ?  ? Report Status PENDING  Incomplete  ?Culture, blood (routine x 2)     Status: None (Preliminary result)  ? Collection Time: 06/05/21  4:47 PM  ? Specimen: BLOOD  ?Result Value Ref Range Status  ? Specimen Description BLOOD RIGHT ANTECUBITAL  Final  ? Special Requests   Final  ?  BOTTLES DRAWN AEROBIC AND ANAEROBIC Blood Culture adequate volume  ? Culture   Final  ?  NO GROWTH 2 DAYS ?Performed at [facility], [place] [address] [place], [place] [address] ?  ? Report Status PENDING  Incomplete  ? ? ? ?Janene Madeira, MSN, NP-C ?[facility] ?[facility]  ?[email or web address] ?Pager: [phone] ?Office: [phone] ?[facility] Main Line: [phone] ?*Secure Chat Communication Welcome  ? ?

## 2021-06-07 NOTE — Progress Notes (Signed)
Subjective: ?Patient reports  Overall doing okay pain is better but difficulty with wound VAC staying in place.  Drainage significantly less though over the last 24 hours while patient is on IV antibiotics ? ?Objective: ?Vital signs in last 24 hours: ?Temp:  [98.2 ?F (36.8 ?C)-98.7 ?F (37.1 ?C)] 98.5 ?F (36.9 ?C) (03/31 1445) ?Pulse Rate:  [76-90] 76 (03/31 1445) ?Resp:  [17-20] 17 (03/31 1445) ?BP: (107-127)/(73-77) 111/75 (03/31 1445) ?SpO2:  [94 %-100 %] 100 % (03/31 1445) ? ?Intake/Output from previous day: ?03/30 0701 - 03/31 0700 ?In: 700 [P.O.:600; IV Piggyback:100] ?Out: -  ?Intake/Output this shift: ?No intake/output data recorded. ? ?1 small spot of drainage superior aspect of the incision otherwise less erythema ? ?Lab Results: ?Recent Labs  ?  06/05/21 ?1635 06/06/21 ?7026  ?WBC 7.8 5.6  ?HGB 11.4* 10.8*  ?HCT 31.5* 31.2*  ?PLT 213 200  ? ?BMET ?Recent Labs  ?  06/05/21 ?1635 06/06/21 ?3785  ?NA 133* 133*  ?K 3.6 3.8  ?CL 96* 96*  ?CO2 27 24  ?GLUCOSE 117* 112*  ?BUN 10 12  ?CREATININE 0.69 0.68  ?CALCIUM 9.0 8.7*  ? ? ?Studies/Results: ?MR THORACIC SPINE W WO CONTRAST ? ?Result Date: 06/07/2021 ?CLINICAL DATA:  Osteomyelitis, thoracic spine. Thoracolumbar fusion 10/31/2020 with lumbar wound debridement 11/27/2020 and thoracic wound debridement and hardware removal 05/22/2021. EXAM: MRI THORACIC WITHOUT AND WITH CONTRAST TECHNIQUE: Multiplanar and multiecho pulse sequences of the thoracic spine were obtained without and with intravenous contrast. CONTRAST:  8.28m GADAVIST GADOBUTROL 1 MMOL/ML IV SOLN COMPARISON:  CT thoracolumbar spine 05/22/2021 and 04/05/2021. Thoracic MRI 11/26/2020. FINDINGS: Despite efforts by the technologist and patient, mild motion artifact is present on today's exam and could not be eliminated. This reduces exam sensitivity and specificity. Alignment:  Physiologic. Vertebrae: Images extend from C6-7 through L1. Patient is status post thoracolumbar fusion from T10 through the  upper sacrum. Since the previous CTs, the T10 and T11 pedicle screws and rods have been removed. The T12 and L1 hardware remains in place. On the sagittal T1 weighted images, the T9 and T10 spinous processes appear irregular with decreased marrow signal. However, no definite suspicious T2 signal abnormality or abnormal enhancement is seen in these areas. There are chronic endplate degenerative changes at T10-11 with endplate sclerosis, cyst formation and mild T2 hyperintensity, similar to previous CT. No endplate destruction or discal hyperintensity is seen to suggest discitis/osteomyelitis. Endplate degenerative changes are also present in the lower cervical spine. Cord: The thoracic cord appears normal in signal and caliber.No abnormal intradural enhancement. Paraspinal and other soft tissues: There is a complex peripherally enhancing fluid collection posteriorly at the level of the removed hardware. This measures up to 3.8 x 4.0 cm transverse on image 34/3. This fluid collection measures approximately 12.9 cm in length, extending superiorly to the T7-8 level. No anterior paraspinal or epidural fluid collections are seen. Disc levels: Lower cervical spondylosis is noted with a right paracentral disc extrusion demonstrating cranial migration at C7-T1. No resulting cord deformity. No significant upper thoracic disc space findings. T7-8: Small right paracentral disc protrusion without cord deformity or foraminal compromise. T8-9: Disc bulging with endplate osteophytes contributing to mild foraminal narrowing bilaterally. No cord deformity. T9-10: Endplate osteophytes with bilateral facet hypertrophy contribute to mild spinal stenosis and mild foraminal narrowing bilaterally. T10-11: Interval hardware removal as above. Chronic degenerative disc disease with loss of disc height, endplate osteophytes and vacuum phenomenon on previous CT. No signs of discitis or vertebral body osteomyelitis. There is  mild spinal stenosis  and mild foraminal narrowing bilaterally. T11-12: Interval hardware removal.  No significant spinal stenosis. T12-L1: Posterior hardware remains in place. Mild disc bulging and endplate osteophytes without significant resulting spinal stenosis. IMPRESSION: 1. Compared with previous CT of 05/22/2021, the posterior hardware has been removed at the T10 and T11 levels. Hardware extending inferiorly from T12 remains in place, incompletely visualized. 2. No evidence of discitis or vertebral body osteomyelitis within the thoracic spine. The spinous processes at T9 and T10 appear somewhat irregular, and given the surrounding fluid collections could reflect early osteomyelitis. 3. Peripherally enhancing fluid collection posteriorly in the surgical bed extending from approximately T7-8 to T12-L1, potentially infected. No evidence of anterior paraspinal or epidural fluid collection. 4. Underlying multilevel spondylosis without high-grade spinal stenosis. Electronically Signed   By: Richardean Sale M.D.   On: 06/07/2021 12:31  ? ?US Abdomen Limited RUQ (LIVER/GB) ? ?Result Date: 06/06/2021 ?CLINICAL DATA:  Right upper quadrant fullness EXAM: ULTRASOUND ABDOMEN LIMITED RIGHT UPPER QUADRANT COMPARISON:  Abdominal ultrasound 11/27/2020 FINDINGS: Gallbladder: No gallstones or wall thickening visualized. No sonographic Murphy sign noted by sonographer. Common bile duct: Diameter: 4 mm Liver: Left lobe not visualized due to bowel gas. Otherwise normal-appearing parenchymal echogenicity with no focal mass identified. Portal vein is patent on color Doppler imaging with normal direction of blood flow towards the liver. Other: None. IMPRESSION: No acute process identified. Electronically Signed   By: Ofilia Neas M.D.   On: 06/06/2021 16:34   ? ?Assessment/Plan: ? Hospital 2 this is a late entry no saw the patient morning on rounds wound VAC was not able same place however drainage this could significantly proven IV antibiotics.   Patient has been recommended get MRI scan help stage the infection rule out worsening or spread of osteo.  I do not think that additional irrigation debridement and open surgical fashion is the ideal choice for him being that we did that 2 weeks ago and might end up in the same position we are now.  Drainage is much less on IV antibiotics and pending results of the MRI scan it may be worthwhile keeping the patient on IV antibiotics from anywhere from 2-6 weeks until the wound heals up enough to stop the drainage if we can't keep a wound VAC in place. ? LOS: 2 days  ? ? ? ?Elaina Hoops ?06/07/2021, 3:26 PM ? ? ? ? ?

## 2021-06-07 NOTE — Progress Notes (Signed)
Mobility Specialist Progress Note: ? ? 06/07/21 1259  ?Mobility  ?Activity Ambulated with assistance in hallway  ?Level of Assistance Independent after set-up  ?Assistive Device Front wheel walker  ?Distance Ambulated (ft) 570 ft  ?Activity Response Tolerated well  ?$Mobility charge 1 Mobility  ? ?Pt received in bed willing to participate in mobility. Complaints of 7/10 back pain. Left EOB with call bell in reach and all needs met.   ? ?Danny Hampton ?Mobility Specialist ?Primary Phone [phone] ? ?

## 2021-06-08 DIAGNOSIS — T847XXD Infection and inflammatory reaction due to other internal orthopedic prosthetic devices, implants and grafts, subsequent encounter: Secondary | ICD-10-CM | POA: Diagnosis not present

## 2021-06-08 LAB — GLUCOSE, CAPILLARY
Glucose-Capillary: 144 mg/dL — ABNORMAL HIGH (ref 70–99)
Glucose-Capillary: 132 mg/dL — ABNORMAL HIGH (ref 70–99)
Glucose-Capillary: 126 mg/dL — ABNORMAL HIGH (ref 70–99)
Glucose-Capillary: 141 mg/dL — ABNORMAL HIGH (ref 70–99)

## 2021-06-08 MED ORDER — HYDROXYZINE HCL 10 MG PO TABS
10.0000 mg | ORAL_TABLET | Freq: Four times a day (QID) | ORAL | Status: DC | PRN
  Administered 2021-06-08 – 2021-06-11 (×8): 10 mg via ORAL
  Filled 2021-06-08 (×8): qty 1

## 2021-06-08 MED ORDER — SENNOSIDES-DOCUSATE SODIUM 8.6-50 MG PO TABS
2.0000 | ORAL_TABLET | Freq: Every day | ORAL | Status: DC
  Administered 2021-06-08: 1 via ORAL
  Administered 2021-06-09: 2 via ORAL
  Administered 2021-06-10: 1 via ORAL
  Filled 2021-06-08 (×4): qty 2

## 2021-06-08 NOTE — Progress Notes (Signed)
Pt c/o itching on his back, hands, and feet started this past winter. Has Hydrocortisone cream but not working per pt. Would like Benadryl. Provider paged, waiting for new orders.  ?

## 2021-06-08 NOTE — Progress Notes (Addendum)
? ?PROGRESS NOTE ? ? ? ?Danny Hampton  ?VXB:[id number]  ?DOB: 1955/03/15  ?DOA: 06/05/2021 ?PCP: System, Provider Not In ?Outpatient Specialists: ? ? ?Hospital course: ? ?66 year old with HTN, DM 2, myotonia congenita and postop wound/spine infection with MSSA.  Patient had been treated and discharged on 05/24/2021 on cefazolin however was readmitted 06/05/2021 after being seen in ID clinic and found to have with worsening drainage from his back and pain ? ?Subjective: ? ?Patient states that he feels better again now that he is on IV antibiotics.  ? ? He is very frustrated on a couple of issues.  Patient has been told by Dr. Saintclair Halsted that he does not have an infected fluid collection in his back but is being told by other physicians that he does have an infected fluid condition in his back and does not know what to believe. ? ?Patient is also very upset that he is not getting his Sinemet at 6 AM and 6 PM which is when he takes his Sinemet at home. ? ? ?Objective: ?Vitals:  ? 06/07/21 2119 06/08/21 0432 06/08/21 0749 06/08/21 1630  ?BP: 128/81 106/69 110/84 106/74  ?Pulse: 79 78 80 80  ?Resp: '19 19 18 18  '$ ?Temp: 98 ?F (36.7 ?C) 98.4 ?F (36.9 ?C) 98 ?F (36.7 ?C) 97.8 ?F (36.6 ?C)  ?TempSrc: Oral Oral Axillary Axillary  ?SpO2: 96% 99% 97% 98%  ?Weight:      ?Height:      ? ? ?Intake/Output Summary (Last 24 hours) at 06/08/2021 1659 ?Last data filed at 06/08/2021 1330 ?Gross per 24 hour  ?Intake 1040 ml  ?Output --  ?Net 1040 ml  ? ?Filed Weights  ? 06/05/21 1836  ?Weight: 89.8 kg  ? ? ? ?Exam: ? ?General: Patient lying in bed, speaking animatedly with me. ?Eyes: sclera anicteric, conjuctiva mild injection bilaterally ?CVS: S1-S2, regular  ?Respiratory:  decreased air entry bilaterally secondary to decreased inspiratory effort, rales at bases  ?GI/back: NABS, soft, NT, wound VAC in place ?LE: No edema.  ?Neuro: A/O x 3, Moving all extremities equally with normal strength, CN 3-12 intact, grossly nonfocal.  ?Psych: patient is  logical and coherent, judgement and insight appear normal, mood and affect appropriate to situation. ? ? ?Assessment & Plan: ?  ?Postop wound infection complicated by hardware ?Patient with multiple surgeries most recently August 2022 with fixation of T10-L3 ?Postop wound infection with MSSA was treated with cefazolin and cefadroxil and followed by ID who noted worsening of infection as an outpatient so patient admitted 06/05/2021 ?Patient was initially placed on Vanco and ceftriaxone however is now on cefazolin only ?Patient feels improvedMRI done shows fluid collection, patient states he is confused because ID believes it to be infected however neurosurgery does not believe it needs to be drained. ?I discussed with patient that this was a not uncommon between ID and surgery but that since ID was a consultation service, neurosurgery made the final decision. ?Wound VAC is placed. ?Aspirin is being held ? ?Movement disorder ?Discussed with pharmacy, they will dose his Sinemet ER to be given 6 AM and 6 PM per his request.  Continue Requip for RLS. ? ?DM 2 ?Under reasonable control on present meds ? ?HTN ?Continue lisinopril and metoprolol ? ?Anxiety and depression ?Continue Paxil, Ativan and hydroxyzine ? ? ? ?DVT prophylaxis: SCD ?Code Status: Full code ?Family Communication: None today ?Disposition Plan:  ? Patient is from: Home ? Anticipated Discharge Location: Home ? Barriers to Discharge: Ongoing infection ? Is patient  medically stable for Discharge: No ? ? ?Scheduled Meds: ? atorvastatin  40 mg Oral Daily  ? Carbidopa-Levodopa ER  2 tablet Oral BID  ? hydrocortisone cream  1 application. Topical BID  ? insulin aspart  0-9 Units Subcutaneous TID WC  ? lisinopril  10 mg Oral Daily  ? loratadine  10 mg Oral Daily  ? LORazepam  1 mg Oral TID  ? metoprolol succinate  25 mg Oral Daily  ? multivitamin with minerals  1 tablet Oral Daily  ? pantoprazole  40 mg Oral Daily  ? PARoxetine  10 mg Oral BID  ? phenytoin  100 mg  Oral TID  ? rOPINIRole  0.5 mg Oral q1800  ? rOPINIRole  1 mg Oral QHS  ? senna-docusate  2 tablet Oral QHS  ? sodium chloride flush  3 mL Intravenous Q12H  ? thiamine  100 mg Oral Daily  ? ?Continuous Infusions: ? sodium chloride    ?  ceFAZolin (ANCEF) IV 2 g (06/08/21 1343)  ? ? ?Data Reviewed: ? ?Basic Metabolic Panel: ?Recent Labs  ?Lab 06/05/21 ?1635 06/06/21 ?3295  ?NA 133* 133*  ?K 3.6 3.8  ?CL 96* 96*  ?CO2 27 24  ?GLUCOSE 117* 112*  ?BUN 10 12  ?CREATININE 0.69 0.68  ?CALCIUM 9.0 8.7*  ? ? ?CBC: ?Recent Labs  ?Lab 06/05/21 ?1635 06/06/21 ?1884  ?WBC 7.8 5.6  ?HGB 11.4* 10.8*  ?HCT 31.5* 31.2*  ?MCV 88.7 89.4  ?PLT 213 200  ? ? ?Studies: ?MR THORACIC SPINE W WO CONTRAST ? ?Result Date: 06/07/2021 ?CLINICAL DATA:  Osteomyelitis, thoracic spine. Thoracolumbar fusion 10/31/2020 with lumbar wound debridement 11/27/2020 and thoracic wound debridement and hardware removal 05/22/2021. EXAM: MRI THORACIC WITHOUT AND WITH CONTRAST TECHNIQUE: Multiplanar and multiecho pulse sequences of the thoracic spine were obtained without and with intravenous contrast. CONTRAST:  8.71m GADAVIST GADOBUTROL 1 MMOL/ML IV SOLN COMPARISON:  CT thoracolumbar spine 05/22/2021 and 04/05/2021. Thoracic MRI 11/26/2020. FINDINGS: Despite efforts by the technologist and patient, mild motion artifact is present on today's exam and could not be eliminated. This reduces exam sensitivity and specificity. Alignment:  Physiologic. Vertebrae: Images extend from C6-7 through L1. Patient is status post thoracolumbar fusion from T10 through the upper sacrum. Since the previous CTs, the T10 and T11 pedicle screws and rods have been removed. The T12 and L1 hardware remains in place. On the sagittal T1 weighted images, the T9 and T10 spinous processes appear irregular with decreased marrow signal. However, no definite suspicious T2 signal abnormality or abnormal enhancement is seen in these areas. There are chronic endplate degenerative changes at T10-11  with endplate sclerosis, cyst formation and mild T2 hyperintensity, similar to previous CT. No endplate destruction or discal hyperintensity is seen to suggest discitis/osteomyelitis. Endplate degenerative changes are also present in the lower cervical spine. Cord: The thoracic cord appears normal in signal and caliber.No abnormal intradural enhancement. Paraspinal and other soft tissues: There is a complex peripherally enhancing fluid collection posteriorly at the level of the removed hardware. This measures up to 3.8 x 4.0 cm transverse on image 34/3. This fluid collection measures approximately 12.9 cm in length, extending superiorly to the T7-8 level. No anterior paraspinal or epidural fluid collections are seen. Disc levels: Lower cervical spondylosis is noted with a right paracentral disc extrusion demonstrating cranial migration at C7-T1. No resulting cord deformity. No significant upper thoracic disc space findings. T7-8: Small right paracentral disc protrusion without cord deformity or foraminal compromise. T8-9: Disc bulging with endplate osteophytes  contributing to mild foraminal narrowing bilaterally. No cord deformity. T9-10: Endplate osteophytes with bilateral facet hypertrophy contribute to mild spinal stenosis and mild foraminal narrowing bilaterally. T10-11: Interval hardware removal as above. Chronic degenerative disc disease with loss of disc height, endplate osteophytes and vacuum phenomenon on previous CT. No signs of discitis or vertebral body osteomyelitis. There is mild spinal stenosis and mild foraminal narrowing bilaterally. T11-12: Interval hardware removal.  No significant spinal stenosis. T12-L1: Posterior hardware remains in place. Mild disc bulging and endplate osteophytes without significant resulting spinal stenosis. IMPRESSION: 1. Compared with previous CT of 05/22/2021, the posterior hardware has been removed at the T10 and T11 levels. Hardware extending inferiorly from T12 remains  in place, incompletely visualized. 2. No evidence of discitis or vertebral body osteomyelitis within the thoracic spine. The spinous processes at T9 and T10 appear somewhat irregular, and given the surrou

## 2021-06-08 NOTE — Progress Notes (Signed)
Subjective: ?Patient reports  patient feels much better significant movement and upper back pain ? ?Objective: ?Vital signs in last 24 hours: ?Temp:  [98 ?F (36.7 ?C)-98.5 ?F (36.9 ?C)] 98 ?F (36.7 ?C) (04/01 0749) ?Pulse Rate:  [76-80] 80 (04/01 0749) ?Resp:  [17-19] 18 (04/01 0749) ?BP: (106-128)/(69-84) 110/84 (04/01 0749) ?SpO2:  [96 %-100 %] 97 % (04/01 0749) ? ?Intake/Output from previous day: ?03/31 0701 - 04/01 0700 ?In: 740 [P.O.:240; IV [id number]] ?Out: -  ?Intake/Output this shift: ?No intake/output data recorded. ? ?Bandage with 1 small spot that was there yesterday morning no evidence of drainage in the last 24 hours and significant improvement over the last 48 hours. ? ?Lab Results: ?Recent Labs  ?  06/05/21 ?1635 06/06/21 ?6767  ?WBC 7.8 5.6  ?HGB 11.4* 10.8*  ?HCT 31.5* 31.2*  ?PLT 213 200  ? ?BMET ?Recent Labs  ?  06/05/21 ?1635 06/06/21 ?2094  ?NA 133* 133*  ?K 3.6 3.8  ?CL 96* 96*  ?CO2 27 24  ?GLUCOSE 117* 112*  ?BUN 10 12  ?CREATININE 0.69 0.68  ?CALCIUM 9.0 8.7*  ? ? ?Studies/Results: ?MR THORACIC SPINE W WO CONTRAST ? ?Result Date: 06/07/2021 ?CLINICAL DATA:  Osteomyelitis, thoracic spine. Thoracolumbar fusion 10/31/2020 with lumbar wound debridement 11/27/2020 and thoracic wound debridement and hardware removal 05/22/2021. EXAM: MRI THORACIC WITHOUT AND WITH CONTRAST TECHNIQUE: Multiplanar and multiecho pulse sequences of the thoracic spine were obtained without and with intravenous contrast. CONTRAST:  8.39m GADAVIST GADOBUTROL 1 MMOL/ML IV SOLN COMPARISON:  CT thoracolumbar spine 05/22/2021 and 04/05/2021. Thoracic MRI 11/26/2020. FINDINGS: Despite efforts by the technologist and patient, mild motion artifact is present on today's exam and could not be eliminated. This reduces exam sensitivity and specificity. Alignment:  Physiologic. Vertebrae: Images extend from C6-7 through L1. Patient is status post thoracolumbar fusion from T10 through the upper sacrum. Since the previous CTs, the  T10 and T11 pedicle screws and rods have been removed. The T12 and L1 hardware remains in place. On the sagittal T1 weighted images, the T9 and T10 spinous processes appear irregular with decreased marrow signal. However, no definite suspicious T2 signal abnormality or abnormal enhancement is seen in these areas. There are chronic endplate degenerative changes at T10-11 with endplate sclerosis, cyst formation and mild T2 hyperintensity, similar to previous CT. No endplate destruction or discal hyperintensity is seen to suggest discitis/osteomyelitis. Endplate degenerative changes are also present in the lower cervical spine. Cord: The thoracic cord appears normal in signal and caliber.No abnormal intradural enhancement. Paraspinal and other soft tissues: There is a complex peripherally enhancing fluid collection posteriorly at the level of the removed hardware. This measures up to 3.8 x 4.0 cm transverse on image 34/3. This fluid collection measures approximately 12.9 cm in length, extending superiorly to the T7-8 level. No anterior paraspinal or epidural fluid collections are seen. Disc levels: Lower cervical spondylosis is noted with a right paracentral disc extrusion demonstrating cranial migration at C7-T1. No resulting cord deformity. No significant upper thoracic disc space findings. T7-8: Small right paracentral disc protrusion without cord deformity or foraminal compromise. T8-9: Disc bulging with endplate osteophytes contributing to mild foraminal narrowing bilaterally. No cord deformity. T9-10: Endplate osteophytes with bilateral facet hypertrophy contribute to mild spinal stenosis and mild foraminal narrowing bilaterally. T10-11: Interval hardware removal as above. Chronic degenerative disc disease with loss of disc height, endplate osteophytes and vacuum phenomenon on previous CT. No signs of discitis or vertebral body osteomyelitis. There is mild spinal stenosis and mild foraminal  narrowing bilaterally.  T11-12: Interval hardware removal.  No significant spinal stenosis. T12-L1: Posterior hardware remains in place. Mild disc bulging and endplate osteophytes without significant resulting spinal stenosis. IMPRESSION: 1. Compared with previous CT of 05/22/2021, the posterior hardware has been removed at the T10 and T11 levels. Hardware extending inferiorly from T12 remains in place, incompletely visualized. 2. No evidence of discitis or vertebral body osteomyelitis within the thoracic spine. The spinous processes at T9 and T10 appear somewhat irregular, and given the surrounding fluid collections could reflect early osteomyelitis. 3. Peripherally enhancing fluid collection posteriorly in the surgical bed extending from approximately T7-8 to T12-L1, potentially infected. No evidence of anterior paraspinal or epidural fluid collection. 4. Underlying multilevel spondylosis without high-grade spinal stenosis. Electronically Signed   By: Richardean Sale M.D.   On: 06/07/2021 12:31  ? ?US Abdomen Limited RUQ (LIVER/GB) ? ?Result Date: 06/06/2021 ?CLINICAL DATA:  Right upper quadrant fullness EXAM: ULTRASOUND ABDOMEN LIMITED RIGHT UPPER QUADRANT COMPARISON:  Abdominal ultrasound 11/27/2020 FINDINGS: Gallbladder: No gallstones or wall thickening visualized. No sonographic Murphy sign noted by sonographer. Common bile duct: Diameter: 4 mm Liver: Left lobe not visualized due to bowel gas. Otherwise normal-appearing parenchymal echogenicity with no focal mass identified. Portal vein is patent on color Doppler imaging with normal direction of blood flow towards the liver. Other: None. IMPRESSION: No acute process identified. Electronically Signed   By: Ofilia Neas M.D.   On: 06/06/2021 16:34   ? ?Assessment/Plan: ?66 year old gentleman wound infection persistent drainage however much improved on IV antibiotics.  Strongly recommend continuing IV antibiotics for up to 6 weeks with PICC line placed patient feels much better  wound is not draining and if we get the wound healed and drainage should cease and patient's skin will not tolerate external wound VAC so that is not really an option I do not think that additional surgery and I&D will help this heal any better than leaving it alone prolonged IV antibiotics at home and follow-up. ? LOS: 3 days  ? ? ? ?Elaina Hoops ?06/08/2021, 9:14 AM ? ? ? ? ?

## 2021-06-08 NOTE — Progress Notes (Signed)
Mobility Specialist Progress Note: ? ? 06/08/21 1100  ?Mobility  ?Activity Ambulated independently in hallway  ?Level of Assistance Independent  ?Assistive Device Front wheel walker  ?Distance Ambulated (ft) 570 ft  ?Activity Response Tolerated well  ?$Mobility charge 1 Mobility  ? ? ?Danny Hampton ?Mobility Specialist ?Primary Phone [phone] ? ?

## 2021-06-09 DIAGNOSIS — T847XXD Infection and inflammatory reaction due to other internal orthopedic prosthetic devices, implants and grafts, subsequent encounter: Secondary | ICD-10-CM | POA: Diagnosis not present

## 2021-06-09 LAB — BASIC METABOLIC PANEL
Anion gap: 5 (ref 5–15)
BUN: 9 mg/dL (ref 8–23)
CO2: 32 mmol/L (ref 22–32)
Calcium: 9.2 mg/dL (ref 8.9–10.3)
Chloride: 101 mmol/L (ref 98–111)
Creatinine, Ser: 0.61 mg/dL (ref 0.61–1.24)
GFR, Estimated: >60 mL/min (ref >60)
Glucose, Bld: 120 mg/dL — ABNORMAL HIGH (ref 70–99)
Potassium: 4.7 mmol/L (ref 3.5–5.1)
Sodium: 138 mmol/L (ref 135–145)

## 2021-06-09 LAB — CBC WITH DIFFERENTIAL/PLATELET
Abs Immature Granulocytes: 0.01 10*3/uL (ref 0.00–0.07)
Basophils Absolute: 0 10*3/uL (ref 0.0–0.1)
Basophils Relative: 0 %
Eosinophils Absolute: 0.3 10*3/uL (ref 0.0–0.5)
Eosinophils Relative: 7 %
HCT: 31.2 % — ABNORMAL LOW (ref 39.0–52.0)
Hemoglobin: 10.9 g/dL — ABNORMAL LOW (ref 13.0–17.0)
Immature Granulocytes: 0 %
Lymphocytes Relative: 27 %
Lymphs Abs: 1.2 10*3/uL (ref 0.7–4.0)
MCH: 31.5 pg (ref 26.0–34.0)
MCHC: 34.9 g/dL (ref 30.0–36.0)
MCV: 90.2 fL (ref 80.0–100.0)
Monocytes Absolute: 0.6 10*3/uL (ref 0.1–1.0)
Monocytes Relative: 12 %
Neutro Abs: 2.5 10*3/uL (ref 1.7–7.7)
Neutrophils Relative %: 54 %
Platelets: 227 10*3/uL (ref 150–400)
RBC: 3.46 MIL/uL — ABNORMAL LOW (ref 4.22–5.81)
RDW: 13.4 % (ref 11.5–15.5)
WBC: 4.6 10*3/uL (ref 4.0–10.5)
nRBC: 0 % (ref 0.0–0.2)

## 2021-06-09 LAB — GLUCOSE, CAPILLARY
Glucose-Capillary: 128 mg/dL — ABNORMAL HIGH (ref 70–99)
Glucose-Capillary: 112 mg/dL — ABNORMAL HIGH (ref 70–99)
Glucose-Capillary: 121 mg/dL — ABNORMAL HIGH (ref 70–99)
Glucose-Capillary: 137 mg/dL — ABNORMAL HIGH (ref 70–99)

## 2021-06-09 NOTE — Progress Notes (Addendum)
? ?PROGRESS NOTE ? ? ? ?Danny Hampton  ?[id number]  ?DOB: Jul 17, 1955  ?DOA: 06/05/2021 ?PCP: System, Provider Not In ?Outpatient Specialists: ? ? ?Hospital course: ? ?66 year old with HTN, DM 2, myotonia congenita and postop wound/spine infection with MSSA.  Patient had been treated and discharged on 05/24/2021 on cefazolin however was readmitted 06/05/2021 after being seen in ID clinic and found to have with worsening drainage from his back and pain ? ?Subjective: ? ?Patient states he is sleepy, has no complaints, wants to go back to sleep ? ? ?Objective: ?Vitals:  ? 06/08/21 0749 06/08/21 1630 06/09/21 0500 06/09/21 0728  ?BP: 110/84 106/74 108/66 136/81  ?Pulse: 80 80 72 67  ?Resp: '18 18 17 18  '$ ?Temp: 98 ?F (36.7 ?C) 97.8 ?F (36.6 ?C) 97.6 ?F (36.4 ?C) 98.1 ?F (36.7 ?C)  ?TempSrc: Axillary Axillary Axillary Oral  ?SpO2: 97% 98% 98% 99%  ?Weight:      ?Height:      ? ?No intake or output data in the 24 hours ending 06/09/21 1630 ? ?Filed Weights  ? 06/05/21 1836  ?Weight: 89.8 kg  ? ? ? ?Exam: ? ?General: Patient lying on his side in bed in NAD, sleepy. ?Eyes: sclera anicteric, conjuctiva mild injection bilaterally ?CVS: S1-S2, regular  ?Respiratory:  decreased air entry bilaterally secondary to decreased inspiratory effort, rales at bases  ?GI/back: NABS, soft, NT, wound VAC in place ?LE: No edema.  ?Neuro: grossly nonfocal.  ?Psych: patient is logical and coherent, judgement and insight appear normal, mood and affect appropriate to situation. ? ? ?Assessment & Plan: ?  ?Postop wound infection complicated by hardware ?Patient with multiple surgeries most recently August 2022 with fixation of T10-L3 ?Postop wound infection with MSSA was treated with cefazolin and cefadroxil and followed by ID who noted worsening of infection as an outpatient so patient admitted 06/05/2021 ?Patient was initially placed on Vanco and ceftriaxone however is now on cefazolin only ?Patient feels improved ?Wound VAC is placed. ?Aspirin is  being held ? ?Movement disorder ?Patient is pleased that his Sinemet ER is being given 6 AM and 6 PM per his request.  Continue Requip for RLS. ? ?DM 2 ?Under reasonable control on present regimen ? ?HTN ?Continue lisinopril and metoprolol ? ?Anxiety and depression ?Continue Paxil, Ativan and hydroxyzine ? ? ? ?DVT prophylaxis: SCD ?Code Status: Full code ?Family Communication: None today ?Disposition Plan:  ? Patient is from: Home ? Anticipated Discharge Location: Home ? Barriers to Discharge: Ongoing infection ? Is patient medically stable for Discharge: No ? ? ?Scheduled Meds: ? atorvastatin  40 mg Oral Daily  ? Carbidopa-Levodopa ER  2 tablet Oral BID  ? hydrocortisone cream  1 application. Topical BID  ? insulin aspart  0-9 Units Subcutaneous TID WC  ? lisinopril  10 mg Oral Daily  ? loratadine  10 mg Oral Daily  ? LORazepam  1 mg Oral TID  ? metoprolol succinate  25 mg Oral Daily  ? multivitamin with minerals  1 tablet Oral Daily  ? pantoprazole  40 mg Oral Daily  ? PARoxetine  10 mg Oral BID  ? phenytoin  100 mg Oral TID  ? rOPINIRole  0.5 mg Oral q1800  ? rOPINIRole  1 mg Oral QHS  ? senna-docusate  2 tablet Oral QHS  ? sodium chloride flush  3 mL Intravenous Q12H  ? thiamine  100 mg Oral Daily  ? ?Continuous Infusions: ? sodium chloride    ?  ceFAZolin (ANCEF) IV 2 g (  06/09/21 1311)  ? ? ?Data Reviewed: ? ?Basic Metabolic Panel: ?Recent Labs  ?Lab 06/05/21 ?1635 06/06/21 ?0254 06/09/21 ?0144  ?NA 133* 133* 138  ?K 3.6 3.8 4.7  ?CL 96* 96* 101  ?CO2 27 24 32  ?GLUCOSE 117* 112* 120*  ?BUN '10 12 9  '$ ?CREATININE 0.69 0.68 0.61  ?CALCIUM 9.0 8.7* 9.2  ? ? ? ?CBC: ?Recent Labs  ?Lab 06/05/21 ?1635 06/06/21 ?2706 06/09/21 ?0144  ?WBC 7.8 5.6 4.6  ?NEUTROABS  --   --  2.5  ?HGB 11.4* 10.8* 10.9*  ?HCT 31.5* 31.2* 31.2*  ?MCV 88.7 89.4 90.2  ?PLT 213 200 227  ? ? ? ?Studies: ?No results found. ? ?Principal Problem: ?  Hardware complicating wound infection ([place]) ?Active Problems: ?  Controlled type 2 diabetes  mellitus without complication, without long-term current use of insulin ([place]) ?  Primary hypertension ?  Abdominal mass, RLQ (right lower quadrant) ?  Anxiety and depression ?  GERD (gastroesophageal reflux disease) ?  Hyperlipidemia ?  Restless legs syndrome ? ? ? ? ?Dewaine Oats Derek Jack, ?[facility] ? ?If 7PM-7AM, please contact night-coverage [email or web address] ? ? LOS: 4 days  ? ?

## 2021-06-10 ENCOUNTER — Inpatient Hospital Stay: Payer: Self-pay

## 2021-06-10 DIAGNOSIS — T847XXD Infection and inflammatory reaction due to other internal orthopedic prosthetic devices, implants and grafts, subsequent encounter: Secondary | ICD-10-CM | POA: Diagnosis not present

## 2021-06-10 LAB — CULTURE, BLOOD (ROUTINE X 2)
Culture: NO GROWTH
Culture: NO GROWTH
Special Requests: ADEQUATE
Special Requests: ADEQUATE

## 2021-06-10 LAB — GLUCOSE, CAPILLARY
Glucose-Capillary: 153 mg/dL — ABNORMAL HIGH (ref 70–99)
Glucose-Capillary: 104 mg/dL — ABNORMAL HIGH (ref 70–99)
Glucose-Capillary: 121 mg/dL — ABNORMAL HIGH (ref 70–99)
Glucose-Capillary: 199 mg/dL — ABNORMAL HIGH (ref 70–99)

## 2021-06-10 MED ORDER — CHLORHEXIDINE GLUCONATE CLOTH 2 % EX PADS
6.0000 | MEDICATED_PAD | Freq: Every day | CUTANEOUS | Status: DC
  Administered 2021-06-11: 6 via TOPICAL

## 2021-06-10 MED ORDER — SODIUM CHLORIDE 0.9% FLUSH: 10.0000 mL | INTRAVENOUS | Status: DC | PRN

## 2021-06-10 MED ORDER — CEFAZOLIN IV (FOR PTA / DISCHARGE USE ONLY): 2.0000 g | Freq: Three times a day (TID) | INTRAVENOUS | 0 refills | Status: AC

## 2021-06-10 NOTE — Progress Notes (Signed)
Mobility Specialist Progress Note: ? ? 06/10/21 1105  ?Mobility  ?Activity Ambulated with assistance in hallway  ?Level of Assistance Independent after set-up  ?Assistive Device Front wheel walker  ?Distance Ambulated (ft) 570 ft  ?Activity Response Tolerated well  ?$Mobility charge 1 Mobility  ? ?Pt received in bed willing to participate in mobility. Complaints of back pain when getting to EOB. Left EOB with call bell in reach and all needs met.  ? ?Danny Hampton ?Mobility Specialist ?Primary Phone [phone] ? ?

## 2021-06-10 NOTE — Progress Notes (Signed)
PHARMACY CONSULT NOTE FOR: ? ?OUTPATIENT  PARENTERAL ANTIBIOTIC THERAPY (OPAT) ? ?Indication: Wound Infection ?Regimen: Cefazolin 2g IV q8h ?End date:  06/27/21 ? ?IV antibiotic discharge orders are pended. ?To discharging provider:  please sign these orders via discharge navigator,  ?Select New Orders & click on the button choice - Manage This Unsigned Work.  ?  ? ?Thank you for allowing pharmacy to be a part of this patient's care. ? ?Willette Cluster ?06/10/2021, 12:02 PM ?

## 2021-06-10 NOTE — Progress Notes (Signed)
Subjective: ?Patient reports  patient doing well pain well managed ? ?Objective: ?Vital signs in last 24 hours: ?Temp:  [97.6 ?F (36.4 ?C)-98.4 ?F (36.9 ?C)] 98.4 ?F (36.9 ?C) (04/03 0756) ?Pulse Rate:  [70-106] 106 (04/03 0756) ?Resp:  [16-18] 18 (04/03 0756) ?BP: (107-135)/(75-87) 107/75 (04/03 0756) ?SpO2:  [94 %-96 %] 95 % (04/03 0756) ? ?Intake/Output from previous day: ?04/02 0701 - 04/03 0700 ?In: 400 [P.O.:400] ?Out: -  ?Intake/Output this shift: ?No intake/output data recorded. ? ?Strength stable 5 out of 5 incision still with erythema extensive flaking or dryness of the skin a little bit more superficial dehiscence in the very superior aspect of his incision.  However dressing is on there is over 24 hours old with no drainage. ? ?Lab Results: ?Recent Labs  ?  06/09/21 ?0144  ?WBC 4.6  ?HGB 10.9*  ?HCT 31.2*  ?PLT 227  ? ?BMET ?Recent Labs  ?  06/09/21 ?0144  ?NA 138  ?K 4.7  ?CL 101  ?CO2 32  ?GLUCOSE 120*  ?BUN 9  ?CREATININE 0.61  ?CALCIUM 9.2  ? ? ?Studies/Results: ?[place] EKG SITE RITE ? ?Result Date: 06/10/2021 ?If [employer] not attached, placement could not be confirmed due to current cardiac rhythm.  ? ?Assessment/Plan: ?66 year old with chronic wound infection.  Point of clarification? We do agree with ID that it is infected.  We do not feel that he requires additional incision and debridement secondary to we performed that 2 weeks ago.  I think it needs long-term wound care long-term IV antibiotics as his infection does not appear to be responding to oral treatment ? LOS: 5 days  ? ? ? ?Elaina Hoops ?06/10/2021, 10:24 AM ? ? ? ? ?

## 2021-06-10 NOTE — Progress Notes (Signed)
? ? ?[place] for Infectious Disease ? ?Date of Admission:  06/05/2021    ? ?Total days of antibiotics 20 ?        ?ASSESSMENT: ? ?Mr. Basnett continues to have drainage from his wound with neurosurgery recommending no further surgical interventions and skin will not tolerate a wound VAC.  Will place PICC line for 2 weeks of Cefazolin with follow up with Dr. [name] in 2 weeks for continuation of IV versus switch to oral. Continue wound care per Neurosurgery recommendations. OPAT/Home Health orders placed. [place] for discharge from ID standpoint. Remaining medical and supportive care per primary team.  ? ?PLAN: ? ?Continue Cefazolin through 06/27/21 ?Insert PICC ?Wound care per Neurosurgery ?OPAT/Home Health Orders ?Appt with Dr. [name] on 06/27/21 ?Remaining medical and supportive care per primary team.  ? ? ?Diagnosis: ?Hardware complicating wound infection  ? ?Culture Result: MSSA ? ?Allergies  ?Allergen Reactions  ? Gabapentin Anxiety  ? ? ?OPAT Orders ?Discharge antibiotics to be given via PICC line ?Discharge antibiotics: Cefazolin  ?Per pharmacy protocol  ?Aim for Vancomycin trough 15-20 or AUC 400-550 (unless otherwise indicated) ?Duration: ?2 weeks  ?End Date: ?06/27/21  ? ?[facility] Care Per Protocol: ? ?Home health RN for IV administration and teaching; PICC line care and labs.   ? ?Labs weekly while on IV antibiotics: ?_X_ CBC with differential ?_X_ BMP ?__ CMP ?_X_ CRP ?_X_ ESR ?__ Vancomycin trough ?__ CK ? ?__ Please pull PIC at completion of IV antibiotics ?_X_ Please leave PIC in place until doctor has seen patient or been notified ? ?Fax weekly labs to [phone] ? ?Clinic Follow Up Appt: ? ?11:15 am on 06/27/21 with Dr. [name]  ? ? ?Principal Problem: ?  Hardware complicating wound infection ([place]) ?Active Problems: ?  GERD (gastroesophageal reflux disease) ?  Primary hypertension ?  Hyperlipidemia ?  Controlled type 2 diabetes mellitus without complication, without long-term current use of insulin  ([place]) ?  Restless legs syndrome ?  Anxiety and depression ?  Abdominal mass, RLQ (right lower quadrant) ? ? ? atorvastatin  40 mg Oral Daily  ? Carbidopa-Levodopa ER  2 tablet Oral BID  ? hydrocortisone cream  1 application. Topical BID  ? insulin aspart  0-9 Units Subcutaneous TID WC  ? lisinopril  10 mg Oral Daily  ? loratadine  10 mg Oral Daily  ? LORazepam  1 mg Oral TID  ? metoprolol succinate  25 mg Oral Daily  ? multivitamin with minerals  1 tablet Oral Daily  ? pantoprazole  40 mg Oral Daily  ? PARoxetine  10 mg Oral BID  ? phenytoin  100 mg Oral TID  ? rOPINIRole  0.5 mg Oral q1800  ? rOPINIRole  1 mg Oral QHS  ? senna-docusate  2 tablet Oral QHS  ? sodium chloride flush  3 mL Intravenous Q12H  ? thiamine  100 mg Oral Daily  ? ? ?SUBJECTIVE: ? ?Afebrile overnight with no acute events. Awaiting pain medication. Ready to go home.  ? ?Allergies  ?Allergen Reactions  ? Gabapentin Anxiety  ? ? ? ?Review of Systems: ?Review of Systems  ?Constitutional:  Negative for chills, fever and weight loss.  ?Respiratory:  Negative for cough, shortness of breath and wheezing.   ?Cardiovascular:  Negative for chest pain and leg swelling.  ?Gastrointestinal:  Negative for abdominal pain, constipation, diarrhea, nausea and vomiting.  ?Musculoskeletal:  Positive for back pain.  ?Skin:  Negative for rash.  ? ? ? ?OBJECTIVE: ?  Vitals:  ? 06/09/21 1636 06/09/21 2022 06/10/21 0325 06/10/21 0756  ?BP: 135/87 108/76 122/75 107/75  ?Pulse: 71 71 70 (!) 106  ?Resp: _0 ?Temp: 97.6 ?F (36.4 ?C) 98.3 ?F (36.8 ?C) 98 ?F (36.7 ?C) 98.4 ?F (36.9 ?C)  ?TempSrc: Oral Oral Oral Oral  ?SpO2: 96% 96% 94% 95%  ?Weight:      ?Height:      ? ?Body mass index is 32.95 kg/m?. ? ?Physical Exam ?Constitutional:   ?   General: He is not in acute distress. ?   Appearance: He is well-developed.  ?Cardiovascular:  ?   Rate and Rhythm: Normal rate and regular rhythm.  ?   Heart sounds: Normal heart sounds.  ?Pulmonary:  ?   Effort: Pulmonary effort  is normal.  ?   Breath sounds: Normal breath sounds.  ?Musculoskeletal:  ?   Comments: There is scaly, crusty appearing skin around the site.   ?Skin: ?   General: Skin is warm and dry.  ?Neurological:  ?   Mental Status: He is alert and oriented to person, place, and time.  ?Psychiatric:     ?   Mood and Affect: Mood normal.  ? ? ?Lab Results ?Lab Results  ?Component Value Date  ? WBC 4.6 06/09/2021  ? HGB 10.9 (L) 06/09/2021  ? HCT 31.2 (L) 06/09/2021  ? MCV 90.2 06/09/2021  ? PLT 227 06/09/2021  ?  ?Lab Results  ?Component Value Date  ? CREATININE 0.61 06/09/2021  ? BUN 9 06/09/2021  ? NA 138 06/09/2021  ? K 4.7 06/09/2021  ? CL 101 06/09/2021  ? CO2 32 06/09/2021  ?  ?Lab Results  ?Component Value Date  ? ALT 9 06/05/2021  ? AST 22 06/05/2021  ? ALKPHOS 71 06/05/2021  ? BILITOT 0.7 06/05/2021  ?  ? ?Microbiology: ?Recent Results (from the past 240 hour(s))  ?Culture, blood (routine x 2)     Status: None  ? Collection Time: 06/05/21  4:35 PM  ? Specimen: BLOOD  ?Result Value Ref Range Status  ? Specimen Description BLOOD LEFT ANTECUBITAL  Final  ? Special Requests   Final  ?  BOTTLES DRAWN AEROBIC AND ANAEROBIC Blood Culture adequate volume  ? Culture   Final  ?  NO GROWTH 5 DAYS ?Performed at [facility], [place] [address] [place], [place] [address] ?  ? Report Status 06/10/2021 FINAL  Final  ?Culture, blood (routine x 2)     Status: None  ? Collection Time: 06/05/21  4:47 PM  ? Specimen: BLOOD  ?Result Value Ref Range Status  ? Specimen Description BLOOD RIGHT ANTECUBITAL  Final  ? Special Requests   Final  ?  BOTTLES DRAWN AEROBIC AND ANAEROBIC Blood Culture adequate volume  ? Culture   Final  ?  NO GROWTH 5 DAYS ?Performed at [facility], [place] [address] [place], [place] [address] ?  ? Report Status 06/10/2021 FINAL  Final  ? ? ? ?Terri Piedra, NP ?[place] for Infectious Disease ?[facility] ? ?06/10/2021 ? ?10:00 AM ? ?

## 2021-06-10 NOTE — Progress Notes (Signed)
? ?PROGRESS NOTE ? ? ? ?Danny Hampton  ?[id number]  ?DOB: Aug 07, 1955  ?DOA: 06/05/2021 ?PCP: System, Provider Not In ?Outpatient Specialists: ? ? ?Hospital course: ? ?66 year old with HTN, DM 2, myotonia congenita and postop wound/spine infection with MSSA.  Patient had been treated and discharged on 05/24/2021 on cefazolin however was readmitted 06/05/2021 after being seen in ID clinic and found to have with worsening drainage from his back and pain ? ?Subjective: ? ?Patient has no complaints, is looking forward to going home tomorrow ? ? ?Objective: ?Vitals:  ? 06/09/21 2022 06/10/21 0325 06/10/21 0756 06/10/21 1613  ?BP: 108/76 122/75 107/75 (!) 138/95  ?Pulse: 71 70 (!) 106 79  ?Resp: '17 16 18 17  '$ ?Temp: 98.3 ?F (36.8 ?C) 98 ?F (36.7 ?C) 98.4 ?F (36.9 ?C) 98.2 ?F (36.8 ?C)  ?TempSrc: Oral Oral Oral Oral  ?SpO2: 96% 94% 95% 97%  ?Weight:      ?Height:      ? ? ?Intake/Output Summary (Last 24 hours) at 06/10/2021 1758 ?Last data filed at 06/09/2021 2021 ?Gross per 24 hour  ?Intake 100 ml  ?Output --  ?Net 100 ml  ? ? ?Filed Weights  ? 06/05/21 1836  ?Weight: 89.8 kg  ? ? ? ?Exam: ? ?General: Patient lying on his side in bed in NAD ?Eyes: sclera anicteric, conjuctiva mild injection bilaterally ?CVS: S1-S2, regular  ?Respiratory:  decreased air entry bilaterally secondary to decreased inspiratory effort, rales at bases  ?GI/back: NABS, soft, NT, wound VAC in place ?LE: No edema.  ?Neuro: grossly nonfocal.  ?Psych: patient is logical and coherent, judgement and insight appear normal, mood and affect appropriate to situation. ? ? ?Assessment & Plan: ?  ?Postop wound infection complicated by hardware ?Plan is for patient to be discharged with PICC line and IV antibiotics tomorrow ? ?Movement disorder ?Patient is pleased that his Sinemet ER is being given 6 AM and 6 PM per his request.  Continue Requip for RLS. ? ?DM 2 ?Under reasonable control on present regimen ? ?HTN ?Continue lisinopril and metoprolol ? ?Anxiety and  depression ?Continue Paxil, Ativan and hydroxyzine ? ? ? ?DVT prophylaxis: SCD ?Code Status: Full code ?Family Communication: None today ?Disposition Plan:  ? Patient is from: Home ? Anticipated Discharge Location: Home ? Barriers to Discharge: Ongoing infection ? Is patient medically stable for Discharge: No ? ? ?Scheduled Meds: ? atorvastatin  40 mg Oral Daily  ? Carbidopa-Levodopa ER  2 tablet Oral BID  ? hydrocortisone cream  1 application. Topical BID  ? insulin aspart  0-9 Units Subcutaneous TID WC  ? lisinopril  10 mg Oral Daily  ? loratadine  10 mg Oral Daily  ? LORazepam  1 mg Oral TID  ? metoprolol succinate  25 mg Oral Daily  ? multivitamin with minerals  1 tablet Oral Daily  ? pantoprazole  40 mg Oral Daily  ? PARoxetine  10 mg Oral BID  ? phenytoin  100 mg Oral TID  ? rOPINIRole  0.5 mg Oral q1800  ? rOPINIRole  1 mg Oral QHS  ? senna-docusate  2 tablet Oral QHS  ? sodium chloride flush  3 mL Intravenous Q12H  ? thiamine  100 mg Oral Daily  ? ?Continuous Infusions: ? sodium chloride    ?  ceFAZolin (ANCEF) IV 2 g (06/10/21 1350)  ? ? ?Data Reviewed: ? ?Basic Metabolic Panel: ?Recent Labs  ?Lab 06/05/21 ?1635 06/06/21 ?3790 06/09/21 ?0144  ?NA 133* 133* 138  ?K 3.6 3.8 4.7  ?  CL 96* 96* 101  ?CO2 27 24 32  ?GLUCOSE 117* 112* 120*  ?BUN '10 12 9  '$ ?CREATININE 0.69 0.68 0.61  ?CALCIUM 9.0 8.7* 9.2  ? ? ? ?CBC: ?Recent Labs  ?Lab 06/05/21 ?1635 06/06/21 ?1245 06/09/21 ?0144  ?WBC 7.8 5.6 4.6  ?NEUTROABS  --   --  2.5  ?HGB 11.4* 10.8* 10.9*  ?HCT 31.5* 31.2* 31.2*  ?MCV 88.7 89.4 90.2  ?PLT 213 200 227  ? ? ? ?Studies: ?[place] EKG SITE RITE ? ?Result Date: 06/10/2021 ?If [employer] not attached, placement could not be confirmed due to current cardiac rhythm.  ? ?Principal Problem: ?  Hardware complicating wound infection ([place]) ?Active Problems: ?  Controlled type 2 diabetes mellitus without complication, without long-term current use of insulin ([place]) ?  Primary hypertension ?  Abdominal mass, RLQ (right lower  quadrant) ?  Anxiety and depression ?  GERD (gastroesophageal reflux disease) ?  Hyperlipidemia ?  Restless legs syndrome ? ? ? ? ?Dewaine Oats Derek Jack, ?[facility] ? ?If 7PM-7AM, please contact night-coverage [email or web address] ? ? LOS: 5 days  ? ?

## 2021-06-10 NOTE — Progress Notes (Signed)
Mobility Specialist Progress Note: ? ? 06/10/21 1615  ?Mobility  ?Activity Ambulated independently in hallway;Ambulated with assistance in hallway  ?Level of Assistance Independent  ?Assistive Device Front wheel walker  ?Distance Ambulated (ft) 400 ft  ?Activity Response Tolerated well  ?$Mobility charge 1 Mobility  ? ?Pt received walking in hall. Left in bed with call bell in reach and all needs met.  ? ?[name] ?Mobility Specialist ?Primary Phone [phone] ? ?

## 2021-06-10 NOTE — TOC Progression Note (Addendum)
Transition of Care (TOC) - Progression Note  ? ? ?Patient Details  ?Name: Danny Hampton ?MRN: [id number] ?Date of Birth: [birth year] ? ?Transition of Care (TOC) CM/SW Contact  ?Marilu Favre, RN ?Phone Number: ?06/10/2021, 10:00 AM ? ?Clinical Narrative:    Bayard Hugger [place], PCP 's nurse is off today, told to fax clinicals and orders to   Dr Osvaldo Angst to [phone] at [facility]  ? ?Spoke to [place] at [facility] , clinicals and orders also need to be faxed to [employer] at [facility] phone [phone] ext [phone] fax [phone] , clinicals faxed. Secure chatted attending for home health orders and OPAT script once determined  ? ?Called Gerilyn Nestle Mether- Venia Minks at [facility] phone [phone] ext [phone] fax [phone] , was told that is the correct fax number but to call [facility] . Was transferred to Sudie Bailey was told to fax clinicals and orders and OPAT to the [facility] transfer office fax [phone] and MD will review to approve for home health and IV ABX . Transfer office phone number is [phone] ext [phone] called and left voicemail, sent fax  ? ? ?Patient has done IV ABX at home before and currently active with [facility]. Amy with [facility] aware of discharge plan and will need orders.  ? ?Pam with [facility] aware of discharge plan and will need OPAT once determined.  ?  ? 1220 Faxed ID note to PCP at [facility], [place] , and transfer office  ? ?[place] [facility], and stated it will be at least 24 hours for approval of PICC and IV ABX. MD aware Patient will need PICC  ? ?Expected Discharge Plan and Services ?  ?  ?  ?  ?  ?                ?  ?  ?  ?  ?  ?  ?  ?  ?  ?  ? ? ?Social Determinants of Health (SDOH) Interventions ?  ? ?Readmission Risk Interventions ?   ? View : No data to display.  ?  ?  ?  ? ? ?

## 2021-06-10 NOTE — Consult Note (Signed)
[place] Nurse wound follow up ?Wound type:nonhealing surgical, infectious ?Measurement:lumbar spine surgical incision, 7 cm.  SLight dehiscence at proximal end.  No defect to pack.  Serous oozing has improved with IV antibiotics.  As I peel back dressing, I notice that skin beneath adhesive is weeping and raised.  I am going to implement topical treatment with no adhesive required.  ?Wound DHD:IXBOERQS  ?Drainage (amount, consistency, odor) scant weeping today  no odor ?Periwound:adhesive irritating periwound skin, causing weeping and raised irritation ?Dressing procedure/placement/frequency: Cleanse lumbar wound with NS and pat dry. Apply Xeroform, double layer over incision line.  Secure with 4x4 gauze and mesh underpants with crotch cut to hold dressing in place  change daily and pRN soilage or stretching ?Will not follow at this time.  Please re-consult if needed.  ?Domenic Moras MSN, RN, FNP-BC CWON ?Wound, Ostomy, Continence Nurse ?Pager [phone]  ?  ?

## 2021-06-10 NOTE — Plan of Care (Signed)

## 2021-06-10 NOTE — Progress Notes (Signed)
Peripherally Inserted Central Catheter Placement ? ?The IV Nurse has discussed with the patient and/or persons authorized to consent for the patient, the purpose of this procedure and the potential benefits and risks involved with this procedure.  The benefits include less needle sticks, lab draws from the catheter, and the patient may be discharged home with the catheter. Risks include, but not limited to, infection, bleeding, blood clot (thrombus formation), and puncture of an artery; nerve damage and irregular heartbeat and possibility to perform a PICC exchange if needed/ordered by physician.  Alternatives to this procedure were also discussed.  Bard Power PICC patient education guide, fact sheet on infection prevention and patient information card has been provided to patient /or left at bedside.   ? ?PICC Placement Documentation  ?PICC Single Lumen 58/59/29 Right Basilic 35 cm 0 cm (Active)  ?Indication for Insertion or Continuance of Line Home intravenous therapies (PICC only);Prolonged intravenous therapies 06/10/21 2049  ?Exposed Catheter (cm) 0 cm 06/10/21 2049  ?Site Assessment Clean;Dry;Intact 06/10/21 2049  ?Line Status Flushed;Saline locked;Blood return noted 06/10/21 2049  ?Dressing Type Securing device;Transparent 06/10/21 2049  ?Dressing Status Antimicrobial disc in place 06/10/21 2049  ?Safety Lock Not Applicable 24/46/28 6381  ?Line Care Connections checked and tightened 06/10/21 2049  ?Dressing Intervention New dressing 06/10/21 2049  ?Dressing Change Due 06/17/21 06/10/21 2049  ? ? ? ? ? ?Izzy Courville, Nicolette Bang ?06/10/2021, 8:51 PM ? ?

## 2021-06-11 DIAGNOSIS — T847XXD Infection and inflammatory reaction due to other internal orthopedic prosthetic devices, implants and grafts, subsequent encounter: Secondary | ICD-10-CM | POA: Diagnosis not present

## 2021-06-11 LAB — GLUCOSE, CAPILLARY
Glucose-Capillary: 138 mg/dL — ABNORMAL HIGH (ref 70–99)
Glucose-Capillary: 167 mg/dL — ABNORMAL HIGH (ref 70–99)

## 2021-06-11 MED ORDER — OXYCODONE HCL 5 MG PO TABS: 5.0000 mg | ORAL_TABLET | Freq: Three times a day (TID) | ORAL | 0 refills | Status: DC | PRN

## 2021-06-11 MED ORDER — HEPARIN SOD (PORK) LOCK FLUSH 100 UNIT/ML IV SOLN
250.0000 [IU] | INTRAVENOUS | Status: AC | PRN
  Administered 2021-06-11: 250 [IU]
  Filled 2021-06-11: qty 2.5

## 2021-06-11 NOTE — Progress Notes (Signed)
NEUROSURGERY PROGRESS NOTE ?Doing ok today. Complaining of back pain this morning. Eager to get home. He did get his PICC line placed yesterday. Incision is looking better and no drainage ? ?Temp:  [98 ?F (36.7 ?C)-98.2 ?F (36.8 ?C)] 98 ?F (36.7 ?C) (04/04 8768) ?Pulse Rate:  [76-94] 94 (04/04 0811) ?Resp:  [17-18] 18 (04/04 1157) ?BP: (109-138)/(73-96) 117/80 (04/04 2620) ?SpO2:  [94 %-98 %] 98 % (04/04 0811) ? ? ?Eleonore Chiquito, NP ?06/11/2021 ?8:17 AM ?  ?

## 2021-06-11 NOTE — TOC Progression Note (Addendum)
Transition of Care (TOC) - Progression Note  ? ? ?Patient Details  ?Name: Danny Hampton ?MRN: [id number] ?Date of Birth: 12/23/1955 ? ?Transition of Care (TOC) CM/SW Contact  ?Marilu Favre, RN ?Phone Number: ?06/11/2021, 8:45 AM ? ?Clinical Narrative:    ? ?Waiting on authorization from [place] for home IV ABX and home health services  ? ?[phone] Dr DR Binnie Rail number [phone] ( ID MD at [facility]) called asking why patient is not going to have an I and D and/ot drain placement. NCM provided Dr Windy Carina office number and attending pager number. NCM has faxed Dr Saintclair Halsted progress note regarding same. NCM secure chatted attending and [place]'s NP with DR Binnie Rail number [phone] contact number. ? ? ?DR Jamse Arn spoke to Dr Binnie Rail and Dr Binnie Rail would like patient to receive 6 weeks of IV ABX, Dr Jamse Arn will communicate with ID at [facility].  ? ?NCM updated Pam with [facility] and Amy with [facility]  ? ? ?1335 Still waiting on [place]. Patient aware  ? ?65 Spoke to patient's girl friend Juliann Pulse and updated her.  ? ?1530 Received auth from [place]. Pam with [facility] doing teaching with patient. Patient wants to be sure he has script for pain medication and wants discharge papers ASAP because his girl friend does not drive at night. Messaged Attending and bedside nurse  ?Expected Discharge Plan: [place] ?  ? ?Expected Discharge Plan and Services ?Expected Discharge Plan: [place] ?  ?Discharge Planning Services: CM Consult ?  ?Living arrangements for the past 2 months: [employer] ?Expected Discharge Date: 06/10/21               ?DME Arranged: N/A ?  ?  ?  ?  ?HH Arranged: RN ?  ?  ?  ?  ? ? ?Social Determinants of Health (SDOH) Interventions ?  ? ?Readmission Risk Interventions ?   ? View : No data to display.  ?  ?  ?  ? ? ?

## 2021-06-11 NOTE — Progress Notes (Signed)
Mobility Specialist Progress Note: ? ? 06/11/21 1243  ?Mobility  ?Activity Ambulated with assistance in hallway  ?Level of Assistance Independent  ?Assistive Device  ?(Iv pole)  ?Distance Ambulated (ft) 400 ft  ?Activity Response Tolerated well  ?$Mobility charge 1 Mobility  ? ?Pt received in hallway. Complaints of back pain. Left in bed with call bell in reach and all needs met.  ? ?Danny Hampton ?Mobility Specialist ?Primary Phone [phone] ? ?

## 2021-06-11 NOTE — Discharge Summary (Signed)
?                                                                                ? ?Danny Hampton MOQ:[id number] DOB: 12-24-1955 DOA: 06/05/2021 ? ?PCP: System, Provider Not In ? ?Admit date: 06/05/2021  Discharge date: 06/11/2021 ? ?Admitted From: home  Disposition:  home with iv antibiotics per opat/id ? ? ?Recommendations for Outpatient Follow-up:  ? ?Follow up with Infectious Disease clinic as scheduled  ?Follow-up with neurosurgery, Dr. Saintclair Halsted as scheduled ? ?Home Health: RN for IV antibiotics at home, approved by [place] ?Equipment/Devices: Already has ?Consultations: Neurosurgery, infectious disease ?Discharge Condition: Improved ?CODE STATUS: Full ?Diet Recommendation: Heart Healthy carb modified ? ?Diet Order   ? ?       ?  Diet general       ?  ?  Diet Carb Modified       ?  ?  Diet - low sodium heart healthy       ?  ?  Diet Carb Modified Fluid consistency: Thin; Room service appropriate? Yes  Diet effective now       ?  ? ?  ?  ? ?  ?  ? ?No chief complaint on file. ?  ? ?Brief history of present illness from the day of admission and additional interim summary   ? ?Danny Hampton is a 66 y.o. adult admitted to the hospital with concern over worsening drainage and back pain in the setting of known  S/P IV Cefazolin 9/18 - 11/02 with ongoing PO cefadroxil 1gm bid for suppression given hardware in place. This was transitioned to cephalexin 2000 mg BID at one point from the [place] team / pharmacy.  ?  ?Following original fusion in September 2022 he was admitted for MSSA bacteremia requiring a I&D of spine. He has had a long standing open wound that was draining back in November 2022. At follow up in early February 2023 he reported increased drainage x 2 weeks from back wound; CT scan Jan 27 showed lucency along T10 transpedicular screws concerning for loosening.  Went to the hospital 3/14 because he was not feeling well. He was taken  to OR for a second debridement of thoracic wound and removal of screws at T10 & T11; clear fluid noted coming up around the T10 screws b/l. The hardware was cultured and did not grow anything.  There was a superficial back culture that grew out rare staph haemolyticus (R-oxacillin) --> suspect this was superficial skin contamination from drainage sampling; if this were pathogenic would have expected it to grow from at least one of the 3 surgical cultures taken on 3/15. ?  ?Presently, he was admitted for consideration of further IV antibiotics and possible I&D / wound vac application. On exam today he has a notable soft tissue defect at the superior aspect of T spine incision that seems to be larger than previously captured in photo images. His incision site is approximated with exception of pinhole draining clear fluid. Surrounding skin with pretty vibrant erythema and flaking skin - he states the skin has been so itchy he has had his girlfriend put rubbing alcohol on it, likely contributing to the current  appearance of the peri wound erythema. On thoracic CT scan 3/15 there was mention of concern of new paraspinal soft tissue inflammation at T10 but also findings that argue against discitis --> will get MR of T-spine with and without to further characterize any possible deep infection or possible abscess pocket that has formed.  ? ?                                                               Hospital Course  ? ? ?Patient had prolonged course of treatment in house for MSSA vertebral infection with hardware complicated by draining surgical site wound s/p I&D in OR and removal of T10 and T11 screws on 05/22/2021.  Imaging shows potentially infected fluid collection at T7-L1 surgical bed and T9-T10 possible osteomyelitis.  Neurosurgery would like to treat conservatively with IV antibiotics while infectious disease believes that IV antibiotics are unlikely to be curative and are recommending drainage and culture of  fluid.  Discussed with infectious diseases at the [place] who are approving his antibiotics.  They also are recommending drainage of likely infected fluid with culture as there may be another source other than MSSA.  However in the absence of that they are recommending 6 weeks of antibiotics rather than 2 weeks given osteomyelitis on imaging.  Patient will be seen as an outpatient and follow-up with infectious disease clinic and with neurosurgery clinic. ? ?  ?Postop wound infection complicated by hardware ?Patient with multiple surgeries most recently August 2022 with fixation of T10-L3 ?Postop wound infection with MSSA was treated with cefazolin and cefadroxil and followed by ID who noted worsening of infection as an outpatient so patient admitted 06/05/2021 ?Patient was initially placed on Vanco and ceftriaxone however is now on cefazolin only ?Patient feels improved ?Wound VAC is placed. ?Patient is discharged home with 2 to 6 weeks of IV antibiotics as per ID clinic.   ?Drainage and culture of infected fluid seen on imaging per neurosurgery. ?# 16 tablets of oxycodone 5 mg provided for as needed pain management as an outpatient ? ?Movement disorder ?Continue Sinemet per home doses  ?Continue Requip for RLS. ?  ?DM 2 ?Restart metformin ?Continue carb modified diet ?Follow-up with PCP ?  ?HTN ?Continue lisinopril and metoprolol ?  ?Anxiety and depression ?Continue Paxil, Ativan and hydroxyzine ? ? ?Discharge diagnosis   ? ? ?Principal Problem: ?  Hardware complicating wound infection ([place]) ?Active Problems: ?  Controlled type 2 diabetes mellitus without complication, without long-term current use of insulin ([place]) ?  Primary hypertension ?  Abdominal mass, RLQ (right lower quadrant) ?  Anxiety and depression ?  GERD (gastroesophageal reflux disease) ?  Hyperlipidemia ?  Restless legs syndrome ? ? ? ?Discharge instructions   ? ?Discharge Instructions   ? ? [facility] pharmacist to adjust dose for Vancomycin,  Aminoglycosides and other anti-infective therapies as requested by physician.   Complete by: As directed ?  ? [facility] to provide Cath Flo 73m   Complete by: As directed ?  ? Administer for PICC line occlusion and as ordered by physician for other access device issues.  ? Anaphylaxis Kit: Provided to treat any anaphylactic reaction to the medication being provided to the patient if First Dose or when requested by physician  Complete by: As directed ?  ? Epinephrine 60m/ml vial / amp: Administer 0.3110m(0.10m72msubcutaneously once for moderate to severe anaphylaxis, nurse to call physician and pharmacy when reaction occurs and call 911 if needed for immediate care  ? Diphenhydramine 28m68m IV vial: Administer 25-28mg72mIM PRN for first dose reaction, rash, itching, mild reaction, nurse to call physician and pharmacy when reaction occurs  ? Sodium Chloride 0.9% NS 500ml 1mAdminister if needed for hypovolemic blood pressure drop or as ordered by physician after call to physician with anaphylactic reaction  ? Call MD for:  persistant nausea and vomiting   Complete by: As directed ?  ? Call MD for:  persistant nausea and vomiting   Complete by: As directed ?  ? Call MD for:  redness, tenderness, or signs of infection (pain, swelling, redness, odor or green/yellow discharge around incision site)   Complete by: As directed ?  ? Call MD for:  redness, tenderness, or signs of infection (pain, swelling, redness, odor or green/yellow discharge around incision site)   Complete by: As directed ?  ? Change dressing on IV access line weekly and PRN   Complete by: As directed ?  ? Diet - low sodium heart healthy   Complete by: As directed ?  ? Diet Carb Modified   Complete by: As directed ?  ? Diet general   Complete by: As directed ?  ? Discharge wound care:   Complete by: As directed ?  ? Patient with wound vac, instructions per wound care and home health  ? Discharge wound care:   Complete by: As directed ?  ?  Cleanse denuded skin around incision to spine with NS and pat gently dry. Apply stoma powder (in the room) to denuded skin.  Light dusting, spread excess.  Seal in powder with skin prep (in the room).   ?

## 2021-06-12 ENCOUNTER — Ambulatory Visit: Payer: No Typology Code available for payment source | Admitting: Infectious Disease

## 2021-06-27 ENCOUNTER — Encounter: Payer: Self-pay | Admitting: Infectious Disease

## 2021-06-27 ENCOUNTER — Ambulatory Visit (INDEPENDENT_AMBULATORY_CARE_PROVIDER_SITE_OTHER): Payer: No Typology Code available for payment source | Admitting: Infectious Disease

## 2021-06-27 ENCOUNTER — Telehealth: Payer: Self-pay

## 2021-06-27 ENCOUNTER — Other Ambulatory Visit: Payer: Self-pay

## 2021-06-27 VITALS — BP 108/75 | HR 85 | Temp 98.3°F | Ht 65.0 in | Wt 195.0 lb

## 2021-06-27 DIAGNOSIS — T847XXD Infection and inflammatory reaction due to other internal orthopedic prosthetic devices, implants and grafts, subsequent encounter: Secondary | ICD-10-CM | POA: Diagnosis not present

## 2021-06-27 DIAGNOSIS — B9561 Methicillin susceptible Staphylococcus aureus infection as the cause of diseases classified elsewhere: Secondary | ICD-10-CM

## 2021-06-27 DIAGNOSIS — R7881 Bacteremia: Secondary | ICD-10-CM | POA: Diagnosis not present

## 2021-06-27 DIAGNOSIS — M48061 Spinal stenosis, lumbar region without neurogenic claudication: Secondary | ICD-10-CM

## 2021-06-27 DIAGNOSIS — I10 Essential (primary) hypertension: Secondary | ICD-10-CM

## 2021-06-27 DIAGNOSIS — T8149XA Infection following a procedure, other surgical site, initial encounter: Secondary | ICD-10-CM

## 2021-06-27 DIAGNOSIS — L299 Pruritus, unspecified: Secondary | ICD-10-CM

## 2021-06-27 NOTE — Telephone Encounter (Signed)
Patient was originally prescribed 2 weeks of cefazolin. Per Dr. [name], patient should have IV antibiotics extended for a total of 6 weeks of therapy. Patient aware. Orders sent to Carolynn Sayers with [facility] pharmacy team notified.  ? ?Beryle Flock, RN ? ?

## 2021-06-27 NOTE — Progress Notes (Signed)
? ?Subjective:  ?Chief complaint: continued though improved back Hampton, drainage from wound and itchiness around the bandage ? ? Patient ID: Danny Hampton, adult    DOB: 11/24/1955, 66 y.o.   MRN: [id number] ? ?HPI ? ?Danny Hampton is a 66 year old Caucasian veteran with history of spinal surgery in August 6433 complicated by surgical site infection and MSSA bacteremia. ? ?There is been concerned that hardware is involved and he has been on protracted cefadroxil after completing his cefazolin.  Fortunately did not have endocarditis on TEE. ? ?Rifampin was not a possibility as an adjunct of therapy due drug drug interactions. ? ?Drainage from the wound itself however had previously worsened  ? ?He had a CT of the lumbar and thoracic spine performed on 27 January did show a lucency along both T10 transpedicular screws which may indicate loosening. ? ? ?He was taken the operating room in March and had I&D with cultures taken which did not yield any organisms he was given a dose of oritavancin and placed back on cefadroxil. ? ?He has been taking the cefadroxil religiously. ? ?He takes 2 tablets twice daily. ? ?In the interval  before my last visit however he has had worsening drainage from his back yet again and now increasing Hampton and increasing bulging of the wound. ? ?I had him electively admitted to the hospital where he was seen by my partners and Dr. Saintclair Hampton. ? ?Repeat MRI of the T spine showed: ? ?IMPRESSION: ?1. Compared with previous CT of 05/22/2021, the posterior hardware ?has been removed at the T10 and T11 levels. Hardware extending ?inferiorly from T12 remains in place, incompletely visualized. ?2. No evidence of discitis or vertebral body osteomyelitis within ?the thoracic spine. The spinous processes at T9 and T10 appear ?somewhat irregular, and given the surrounding fluid collections ?could reflect early osteomyelitis. ?3. Peripherally enhancing fluid collection posteriorly in the ?surgical bed extending from  approximately T7-8 to T12-L1, ?potentially infected. No evidence of anterior paraspinal or epidural ?fluid collection. ?4. Underlying multilevel spondylosis without high-grade spinal ?stenosis. ? ?There was lots of discussion re IV abx being started again vs po and whether or not he needed I and D which I felt prior to admission that he did need. ? ?Last plan documented in our notes was 2 weeks of IV ancef followed by po abx but Dr. Binnie Hampton at [facility] in [facility] had wanted 6 weeks of IV therapy, I believe due to concerns re the area on imaging for osteomyelitis. ? ?Since returning home his wound opened up much more on its own and began draining. It no longer smells terribly in past "like I had stepped in dog shit." ? ? ?Hampton is improved. ? ? ?Past Medical History:  ?Diagnosis Date  ? Acid reflux   ? Anxiety   ? Arrhythmia   ? paroxysmal supreventricular tachycardia  ? Arthritis   ? Complication of anesthesia   ? Myotonia congenita  ? Dysrhythmia   ? Essential hypertension   ? Family history of adverse reaction to anesthesia   ? Sister  ? Hardware complicating wound infection ([place]) 04/11/2021  ? Hernia, abdominal 06/05/2021  ? High cholesterol   ? History of skin cancer   ? Myotonia congenita   ? Type 2 diabetes mellitus ([place])   ? ? ?Past Surgical History:  ?Procedure Laterality Date  ? APPLICATION OF WOUND VAC  11/27/2020  ? Procedure: APPLICATION OF WOUND VAC;  Surgeon: Danny Miss, MD;  Location: [place];  Service: Neurosurgery;;  ?  BACK SURGERY    ? times 2  ? CARPAL TUNNEL RELEASE    ? CATARACT EXTRACTION W/PHACO Left 01/31/2019  ? Procedure: CATARACT EXTRACTION PHACO AND INTRAOCULAR LENS PLACEMENT (IOC);  Surgeon: Danny Goldmann, MD;  Location: [facility];  Service: Ophthalmology;  Laterality: Left;  CDE: 2.66  ? LUMBAR WOUND DEBRIDEMENT N/A 11/27/2020  ? Procedure: LUMBAR WOUND DEBRIDEMENT AND WASHOUT;  Surgeon: Danny Miss, MD;  Location: [place];  Service: Neurosurgery;  Laterality: N/A;  ? LUMBAR WOUND DEBRIDEMENT  N/A 05/22/2021  ? Procedure: Thoracic wound debridement with removal of hardware at Thoracic ten-eleven;  Surgeon: Danny Kos, MD;  Location: [place];  Service: Neurosurgery;  Laterality: N/A;  ? NOSE SURGERY    ? TEE WITHOUT CARDIOVERSION N/A 12/03/2020  ? Procedure: TRANSESOPHAGEAL ECHOCARDIOGRAM (TEE);  Surgeon: Danny Pain, MD;  Location[facility];  Service: Cardiovascular;  Laterality: N/A;  ? TOTAL KNEE ARTHROPLASTY Right 10/12/2018  ? Procedure: RIGHT TOTAL KNEE ARTHROPLASTY;  Surgeon: Danny Balding, MD;  Location: [facility];  Service: Orthopedics;  Laterality: Right;  ? ? ?Family History  ?Problem Relation Age of Onset  ? Healthy Mother   ? Healthy Father   ? Cancer Father   ? ? ?  ?Social History  ? ?Socioeconomic History  ? Marital status: Divorced  ?  Spouse name: Not on file  ? Number of children: 0  ? Years of education: Not on file  ? Highest education level: Not on file  ?Occupational History  ? Occupation: retired Clinical biochemist  ?Tobacco Use  ? Smoking status: Former  ?  Packs/day: 1.00  ?  Years: 30.00  ?  Pack years: 30.00  ?  Types: Cigarettes  ?  Quit date: 03/2018  ?  Years since quitting: 3.3  ? Smokeless tobacco: Never  ?Vaping Use  ? Vaping Use: Never used  ?Substance and Sexual Activity  ? Alcohol use: Yes  ?  Comment: sometimes  ? Drug use: Yes  ?  Types: Marijuana  ?  Comment: weekly  ? Sexual activity: Not Currently  ?Other Topics Concern  ? Not on file  ?Social History Narrative  ? Right handed  ? ?Social Determinants of Health  ? ?Financial Resource Strain: Not on file  ?Food Insecurity: Not on file  ?Transportation Needs: Not on file  ?Physical Activity: Not on file  ?Stress: Not on file  ?Social Connections: Not on file  ? ? ?Allergies  ?Allergen Reactions  ? Gabapentin Anxiety  ? ? ? ?Current Outpatient Medications:  ?  acetaminophen (TYLENOL) 325 MG tablet, Take 2 tablets (650 mg total) by mouth every 6 (six) hours as needed for mild Hampton, headache or fever., Disp: , Rfl:  ?   Carbidopa-Levodopa ER 48.75-195 MG CPCR, Take 2 tablets by mouth 2 (two) times daily., Disp: , Rfl:  ?  ceFAZolin (ANCEF) IVPB, Inject 2 g into the vein every 8 (eight) hours for 17 days. Indication:  Wound Infection First Dose: Yes Last Day of Therapy:  06/27/21 Labs - Once weekly:  CBC/D and BMP, Labs - Every other week:  ESR and CRP Method of administration: IV Push Method of administration may be changed at the discretion of home infusion pharmacist based upon assessment of the patient and/or caregiver's ability to self-administer the medication ordered., Disp: 51 Units, Rfl: 0 ?  Cholecalciferol (VITAMIN D3) 125 MCG (5000 UT) TABS, Take 5,000 Units by mouth daily. , Disp: , Rfl:  ?  HYDROXYZINE HCL PO, Take 1 tablet by  mouth 4 (four) times daily as needed (anxiety)., Disp: , Rfl:  ?  lisinopril (ZESTRIL) 10 MG tablet, Take 10 mg by mouth daily., Disp: , Rfl:  ?  loratadine (CLARITIN) 10 MG tablet, Take 10 mg by mouth daily., Disp: , Rfl:  ?  LORazepam (ATIVAN) 1 MG tablet, Take 1 tablet (1 mg total) by mouth 2 (two) times daily as needed for up to 4 doses for anxiety. (Patient taking differently: Take 1 mg by mouth 3 (three) times daily.), Disp: 4 tablet, Rfl: 0 ?  metFORMIN (GLUCOPHAGE) 500 MG tablet, Take 500 mg by mouth 2 (two) times daily with a meal. , Disp: , Rfl:  ?  metoprolol succinate (TOPROL-XL) 25 MG 24 hr tablet, Take 25 mg by mouth daily., Disp: , Rfl:  ?  Multiple Vitamin (MULTI-VITAMIN) tablet, Take 1 tablet by mouth daily., Disp: , Rfl:  ?  naloxone (NARCAN) nasal spray 4 mg/0.1 mL, Place 0.4 mg into the nose once., Disp: , Rfl:  ?  pantoprazole (PROTONIX) 40 MG tablet, Take 40 mg by mouth daily., Disp: , Rfl:  ?  PARoxetine (PAXIL) 20 MG tablet, Take 1 tablet (20 mg total) by mouth 2 (two) times daily. (Patient taking differently: Take 10 mg by mouth 2 (two) times daily.), Disp: , Rfl:  ?  phenytoin (DILANTIN) 100 MG ER capsule, Take 100 mg by mouth 3 (three) times daily., Disp: , Rfl:  ?   rOPINIRole (REQUIP) 1 MG tablet, Take 0.49m at 6pm and 135mat 9pm. (Patient taking differently: Take 0.5 mg by mouth See admin instructions. Take 0.5 mg by mouth 6pm  and  1 mg at 10 pm), Disp: 135 tablet, Rfl: 3 ?  r

## 2021-07-01 ENCOUNTER — Observation Stay (HOSPITAL_COMMUNITY)
Admission: EM | Admit: 2021-07-01 | Discharge: 2021-07-02 | Disposition: A | Discharge status: Home Health | Payer: No Typology Code available for payment source | Attending: Internal Medicine | Admitting: Internal Medicine

## 2021-07-01 ENCOUNTER — Emergency Department (HOSPITAL_COMMUNITY): Payer: No Typology Code available for payment source

## 2021-07-01 ENCOUNTER — Other Ambulatory Visit: Payer: Self-pay

## 2021-07-01 ENCOUNTER — Encounter (HOSPITAL_COMMUNITY): Payer: Self-pay | Admitting: *Deleted

## 2021-07-01 DIAGNOSIS — T82868A Thrombosis of vascular prosthetic devices, implants and grafts, initial encounter: Principal | ICD-10-CM | POA: Insufficient documentation

## 2021-07-01 DIAGNOSIS — Z87891 Personal history of nicotine dependence: Secondary | ICD-10-CM | POA: Insufficient documentation

## 2021-07-01 DIAGNOSIS — F419 Anxiety disorder, unspecified: Secondary | ICD-10-CM | POA: Insufficient documentation

## 2021-07-01 DIAGNOSIS — Z7984 Long term (current) use of oral hypoglycemic drugs: Secondary | ICD-10-CM | POA: Diagnosis not present

## 2021-07-01 DIAGNOSIS — T82898A Other specified complication of vascular prosthetic devices, implants and grafts, initial encounter: Secondary | ICD-10-CM | POA: Diagnosis not present

## 2021-07-01 DIAGNOSIS — Z79899 Other long term (current) drug therapy: Secondary | ICD-10-CM | POA: Diagnosis not present

## 2021-07-01 DIAGNOSIS — T8149XA Infection following a procedure, other surgical site, initial encounter: Secondary | ICD-10-CM | POA: Diagnosis not present

## 2021-07-01 DIAGNOSIS — E119 Type 2 diabetes mellitus without complications: Secondary | ICD-10-CM | POA: Diagnosis not present

## 2021-07-01 DIAGNOSIS — I10 Essential (primary) hypertension: Secondary | ICD-10-CM | POA: Diagnosis present

## 2021-07-01 DIAGNOSIS — F32A Depression, unspecified: Secondary | ICD-10-CM | POA: Diagnosis not present

## 2021-07-01 DIAGNOSIS — F411 Generalized anxiety disorder: Secondary | ICD-10-CM | POA: Diagnosis present

## 2021-07-01 DIAGNOSIS — G7112 Myotonia congenita: Secondary | ICD-10-CM | POA: Diagnosis present

## 2021-07-01 DIAGNOSIS — Y828 Other medical devices associated with adverse incidents: Secondary | ICD-10-CM | POA: Insufficient documentation

## 2021-07-01 HISTORY — DX: Other specified complication of vascular prosthetic devices, implants and grafts, initial encounter: T82.898A

## 2021-07-01 LAB — CBC WITH DIFFERENTIAL/PLATELET
Abs Immature Granulocytes: 0.01 10*3/uL (ref 0.00–0.07)
Basophils Absolute: 0.1 10*3/uL (ref 0.0–0.1)
Basophils Relative: 1 %
Eosinophils Absolute: 0.5 10*3/uL (ref 0.0–0.5)
Eosinophils Relative: 9 %
HCT: 37.2 % — ABNORMAL LOW (ref 39.0–52.0)
Hemoglobin: 12.8 g/dL — ABNORMAL LOW (ref 13.0–17.0)
Immature Granulocytes: 0 %
Lymphocytes Relative: 27 %
Lymphs Abs: 1.5 10*3/uL (ref 0.7–4.0)
MCH: 31.2 pg (ref 26.0–34.0)
MCHC: 34.4 g/dL (ref 30.0–36.0)
MCV: 90.7 fL (ref 80.0–100.0)
Monocytes Absolute: 0.6 10*3/uL (ref 0.1–1.0)
Monocytes Relative: 10 %
Neutro Abs: 3 10*3/uL (ref 1.7–7.7)
Neutrophils Relative %: 53 %
Platelets: 178 10*3/uL (ref 150–400)
RBC: 4.1 MIL/uL — ABNORMAL LOW (ref 4.22–5.81)
RDW: 13.4 % (ref 11.5–15.5)
WBC: 5.7 10*3/uL (ref 4.0–10.5)
nRBC: 0 % (ref 0.0–0.2)

## 2021-07-01 LAB — BASIC METABOLIC PANEL
Anion gap: 10 (ref 5–15)
BUN: 12 mg/dL (ref 8–23)
CO2: 28 mmol/L (ref 22–32)
Calcium: 9.6 mg/dL (ref 8.9–10.3)
Chloride: 99 mmol/L (ref 98–111)
Creatinine, Ser: 0.7 mg/dL (ref 0.61–1.24)
GFR, Estimated: >60 mL/min (ref >60)
Glucose, Bld: 129 mg/dL — ABNORMAL HIGH (ref 70–99)
Potassium: 4.5 mmol/L (ref 3.5–5.1)
Sodium: 137 mmol/L (ref 135–145)

## 2021-07-01 IMAGING — DX DG CHEST 1V PORT
1 series · 1 of 1 positions shown · non-contrast
Comparison: Chest x-ray [DATE].

CLINICAL DATA: PICC line placement

EXAM:
PORTABLE CHEST 1 VIEW

[chest ap]
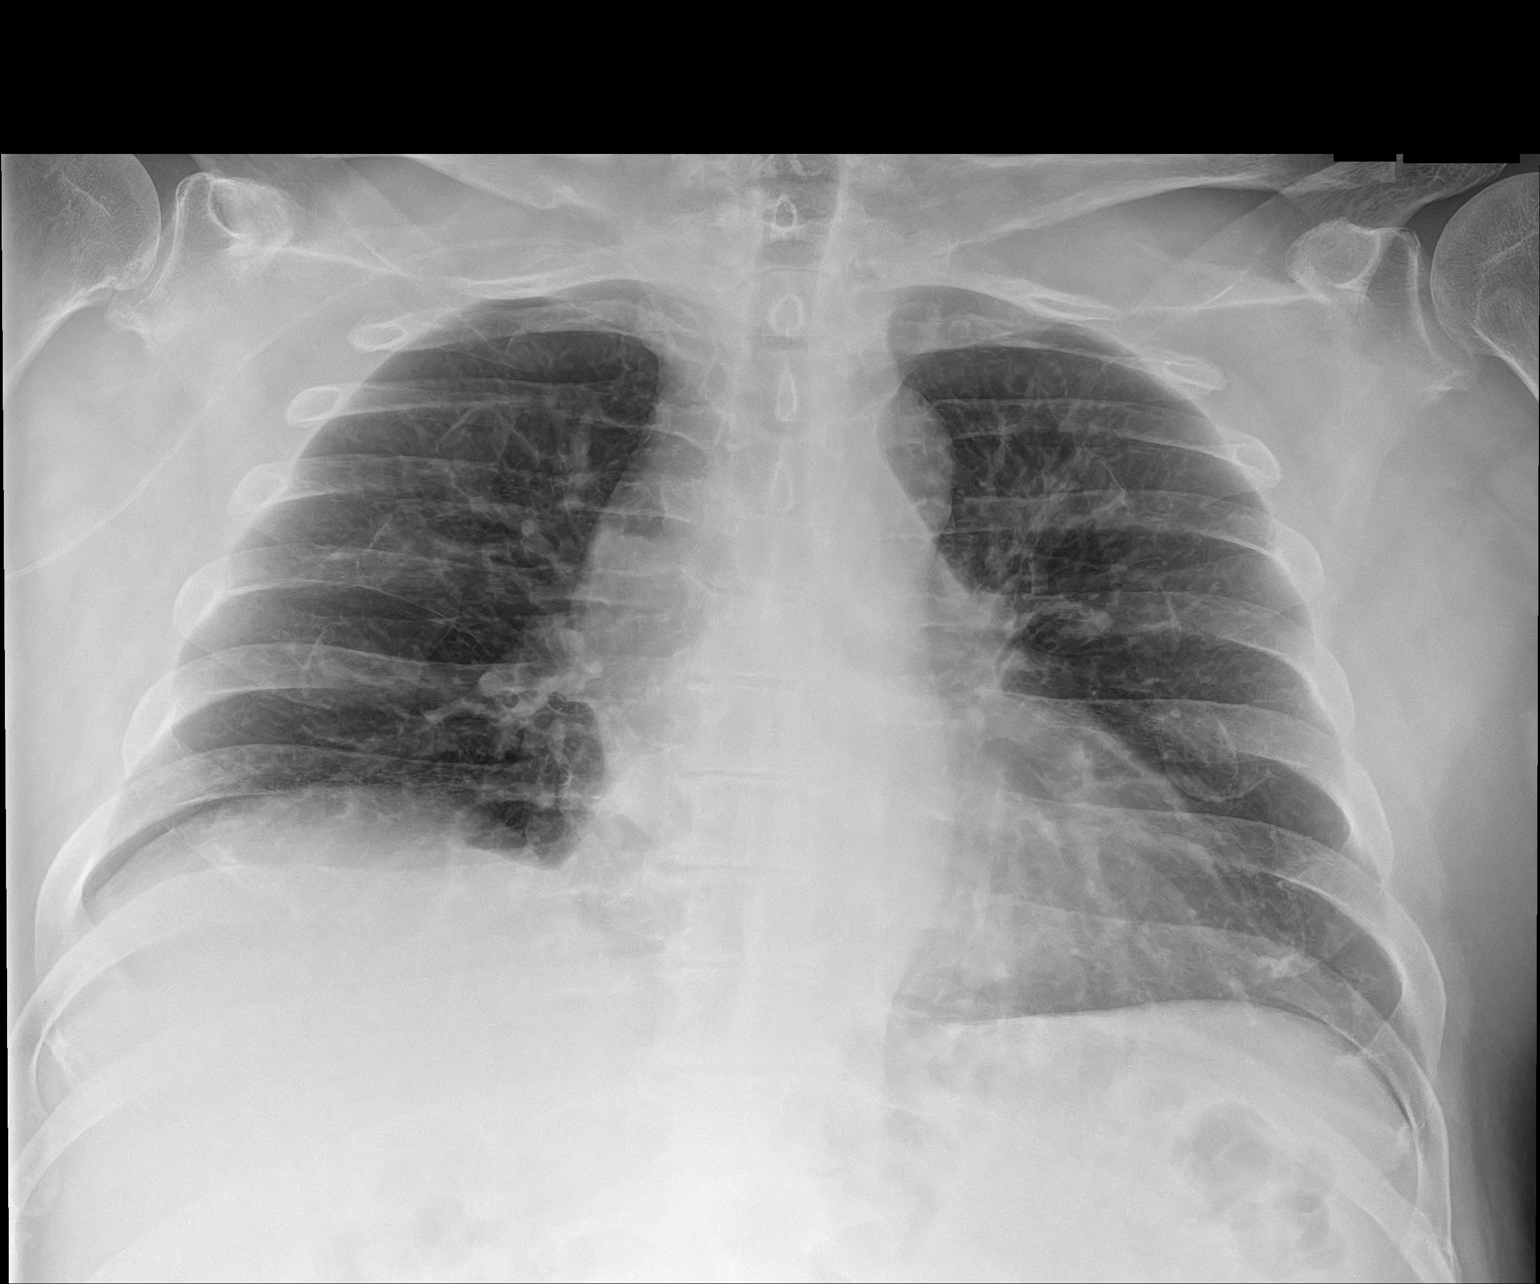

[1 of 1 positions shown; findings below may reference images not displayed]

FINDINGS: Right PICC with the tip projecting near the confluence of the right
brachiocephalic and superior vena cava. No visible pneumothorax or
pleural effusions. Low lung volumes. No consolidation. A displaced
fracture.
IMPRESSION: Right PICC with the tip projecting near the confluence of the right
brachiocephalic and superior vena cava.

## 2021-07-01 MED ORDER — STERILE WATER FOR INJECTION IJ SOLN
INTRAMUSCULAR | Status: AC
  Administered 2021-07-01: 10 mL
  Filled 2021-07-01: qty 10

## 2021-07-01 MED ORDER — PAROXETINE HCL 20 MG PO TABS
40.0000 mg | ORAL_TABLET | Freq: Two times a day (BID) | ORAL | Status: DC
  Administered 2021-07-01 – 2021-07-02 (×2): 40 mg via ORAL
  Filled 2021-07-01 (×2): qty 2

## 2021-07-01 MED ORDER — PANTOPRAZOLE SODIUM 40 MG PO TBEC
40.0000 mg | DELAYED_RELEASE_TABLET | Freq: Every day | ORAL | Status: DC
  Administered 2021-07-02: 40 mg via ORAL
  Filled 2021-07-01: qty 1

## 2021-07-01 MED ORDER — ROSUVASTATIN CALCIUM 20 MG PO TABS
40.0000 mg | ORAL_TABLET | Freq: Every day | ORAL | Status: DC
  Administered 2021-07-01 – 2021-07-02 (×2): 40 mg via ORAL
  Filled 2021-07-01 (×2): qty 2

## 2021-07-01 MED ORDER — HYDROXYZINE HCL 25 MG PO TABS: 25.0000 mg | ORAL_TABLET | Freq: Four times a day (QID) | ORAL | Status: DC | PRN

## 2021-07-01 MED ORDER — LISINOPRIL-HYDROCHLOROTHIAZIDE 20-25 MG PO TABS: 1.0000 | ORAL_TABLET | Freq: Every day | ORAL | Status: DC

## 2021-07-01 MED ORDER — CEFAZOLIN SODIUM-DEXTROSE 1-4 GM/50ML-% IV SOLN: 1.0000 g | Freq: Once | INTRAVENOUS | Status: DC

## 2021-07-01 MED ORDER — ALTEPLASE 2 MG IJ SOLR
2.0000 mg | Freq: Once | INTRAMUSCULAR | Status: AC
  Administered 2021-07-01: 2 mg
  Filled 2021-07-01 (×2): qty 2

## 2021-07-01 MED ORDER — OXYCODONE HCL 5 MG PO TABS
5.0000 mg | ORAL_TABLET | Freq: Three times a day (TID) | ORAL | Status: DC | PRN
  Administered 2021-07-01: 5 mg via ORAL
  Filled 2021-07-01: qty 1

## 2021-07-01 MED ORDER — ACETAMINOPHEN 650 MG RE SUPP: 650.0000 mg | Freq: Four times a day (QID) | RECTAL | Status: DC | PRN

## 2021-07-01 MED ORDER — PAROXETINE HCL 10 MG PO TABS: 10.0000 mg | ORAL_TABLET | Freq: Two times a day (BID) | ORAL | Status: DC

## 2021-07-01 MED ORDER — CEFAZOLIN SODIUM-DEXTROSE 1-4 GM/50ML-% IV SOLN: 2.0000 g | Freq: Once | INTRAVENOUS | Status: DC

## 2021-07-01 MED ORDER — INSULIN ASPART 100 UNIT/ML IJ SOLN
0.0000 [IU] | Freq: Three times a day (TID) | INTRAMUSCULAR | Status: DC
  Administered 2021-07-02: 2 [IU] via SUBCUTANEOUS

## 2021-07-01 MED ORDER — HYDROCHLOROTHIAZIDE 25 MG PO TABS
25.0000 mg | ORAL_TABLET | Freq: Every day | ORAL | Status: DC
  Administered 2021-07-02: 25 mg via ORAL
  Filled 2021-07-01: qty 1

## 2021-07-01 MED ORDER — LORAZEPAM 1 MG PO TABS
1.0000 mg | ORAL_TABLET | Freq: Two times a day (BID) | ORAL | Status: DC | PRN
  Administered 2021-07-02: 1 mg via ORAL
  Filled 2021-07-01: qty 1

## 2021-07-01 MED ORDER — PHENYTOIN SODIUM EXTENDED 100 MG PO CAPS
100.0000 mg | ORAL_CAPSULE | Freq: Three times a day (TID) | ORAL | Status: DC
  Administered 2021-07-02 (×2): 100 mg via ORAL
  Filled 2021-07-01 (×6): qty 1

## 2021-07-01 MED ORDER — CARBIDOPA-LEVODOPA ER 48.75-195 MG PO CPCR: 2.0000 | ORAL_CAPSULE | Freq: Four times a day (QID) | ORAL | Status: DC

## 2021-07-01 MED ORDER — ROPINIROLE HCL 0.25 MG PO TABS: 0.5000 mg | ORAL_TABLET | ORAL | Status: DC

## 2021-07-01 MED ORDER — METOPROLOL SUCCINATE ER 25 MG PO TB24
25.0000 mg | ORAL_TABLET | Freq: Every day | ORAL | Status: DC
  Administered 2021-07-02: 25 mg via ORAL
  Filled 2021-07-01: qty 1

## 2021-07-01 MED ORDER — LISINOPRIL 10 MG PO TABS
20.0000 mg | ORAL_TABLET | Freq: Every day | ORAL | Status: DC
  Administered 2021-07-02: 20 mg via ORAL
  Filled 2021-07-01: qty 2

## 2021-07-01 MED ORDER — ROPINIROLE HCL 0.25 MG PO TABS
0.5000 mg | ORAL_TABLET | Freq: Every day | ORAL | Status: DC
  Filled 2021-07-01: qty 2

## 2021-07-01 MED ORDER — HYDROXYZINE HCL 25 MG PO TABS: 25.0000 mg | ORAL_TABLET | Freq: Three times a day (TID) | ORAL | Status: DC | PRN

## 2021-07-01 MED ORDER — CEFAZOLIN SODIUM-DEXTROSE 2-4 GM/100ML-% IV SOLN
2.0000 g | Freq: Three times a day (TID) | INTRAVENOUS | Status: DC
  Administered 2021-07-01 – 2021-07-02 (×3): 2 g via INTRAVENOUS
  Filled 2021-07-01 (×3): qty 100

## 2021-07-01 MED ORDER — CARBIDOPA-LEVODOPA ER 50-200 MG PO TBCR
2.0000 | EXTENDED_RELEASE_TABLET | Freq: Three times a day (TID) | ORAL | Status: DC
  Administered 2021-07-02 (×2): 2 via ORAL
  Filled 2021-07-01 (×2): qty 2

## 2021-07-01 MED ORDER — METFORMIN HCL 500 MG PO TABS: 500.0000 mg | ORAL_TABLET | Freq: Two times a day (BID) | ORAL | Status: DC

## 2021-07-01 MED ORDER — ROPINIROLE HCL 1 MG PO TABS
1.0000 mg | ORAL_TABLET | Freq: Every day | ORAL | Status: DC
  Administered 2021-07-01: 1 mg via ORAL
  Filled 2021-07-01: qty 1

## 2021-07-01 MED ORDER — ACETAMINOPHEN 325 MG PO TABS: 650.0000 mg | ORAL_TABLET | Freq: Four times a day (QID) | ORAL | Status: DC | PRN

## 2021-07-01 NOTE — ED Triage Notes (Signed)
Pt states his home health nurse came out to flush his picc line and today it was clogged; pt had a dose of antibiotics today at 7am and it worked with no difficulty ?

## 2021-07-01 NOTE — Assessment & Plan Note (Signed)
-  Continue lisinopril/HCTZ and metoprolol ?-Monitor BP per floor protocol ? ? ?

## 2021-07-01 NOTE — Assessment & Plan Note (Signed)
-  two unsuccessful attempts with cathflo  ?-PICC team consulted and plans to replace PICC in the AM ?-Continue ancef via peripheral IV in the interim  ?

## 2021-07-01 NOTE — Assessment & Plan Note (Signed)
Continue carb modified diet ?Hold metformin ?Sliding scale coverage ?

## 2021-07-01 NOTE — Assessment & Plan Note (Signed)
Continue requip ?Continue sinemet ?

## 2021-07-01 NOTE — Assessment & Plan Note (Signed)
-  Most recent surgery Aug 2022 - fixation D32 - L3 complicated by MSSA bacteremia.  ?-previous antibiotics: Cefazolin 9/18 - 11/02 with ongoing PO cefadroxil 1gm bid for suppression given hardware in place. This was transitioned to cephalexin 2000 mg BID at one point from the [place] team / pharmacy.  ?-Discharged on 4/4 with recs for 6 weeks of antibiotics (PICC was placed 4/3) ?-Continue Ancef  ?-Monitor for signs of worsening infection; currently afebrile, WBC pending ?

## 2021-07-01 NOTE — ED Provider Notes (Signed)
?[name] ?Provider Note ? ? ?CSN: [id number] ?Arrival date & time: 07/01/21  1508 ? ?  ? ?History ? ?Chief Complaint  ?Patient presents with  ? clogged picc line  ? ? ?Danny Hampton is a 66 y.o. adult. ? ?HPI ? ?  ? ? ?Danny Hampton is a 66 y.o. adult who presents to the Emergency Department complaining of clogged PICC line.  He has been receiving antibiotic cefazolin TID through PICC line for several weeks secondary to osteomyelitis of the spine and hardware complication with bacteremia he states he has had difficulty with administering the antibiotics (meeting resistance) but was able to get his antibiotics administered today.  Line was flushed and heparinized home health nurse came out this afternoon attempted to flush the line but was unsuccessful.  Patient was sent here for further evaluation of his PICC line ? ? ?Home Medications ?Prior to Admission medications   ?Medication Sig Start Date End Date Taking? Authorizing Provider  ?acetaminophen (TYLENOL) 325 MG tablet Take 2 tablets (650 mg total) by mouth every 6 (six) hours as needed for mild pain, headache or fever. 12/06/20   Antonieta Pert, MD  ?albuterol (VENTOLIN HFA) 108 (90 Base) MCG/ACT inhaler Inhale 2 puffs into the lungs every 6 (six) hours as needed for wheezing or shortness of breath. ?Patient not taking: Reported on 02/06/2021 04/26/20   Barton Dubois, MD  ?atorvastatin (LIPITOR) 80 MG tablet Take 40 mg by mouth daily.  ?Patient not taking: Reported on 06/27/2021    [provider]  ?Carbidopa-Levodopa ER 48.75-195 MG CPCR Take 2 tablets by mouth 2 (two) times daily.    [provider]  ?Cholecalciferol (VITAMIN D3) 125 MCG (5000 UT) TABS Take 5,000 Units by mouth daily.     [provider]  ?HYDROXYZINE HCL PO Take 1 tablet by mouth 4 (four) times daily as needed (anxiety).    [provider]  ?lisinopril (ZESTRIL) 10 MG tablet Take 10 mg by mouth daily.    [provider]   ?loratadine (CLARITIN) 10 MG tablet Take 10 mg by mouth daily.    [provider]  ?LORazepam (ATIVAN) 1 MG tablet Take 1 tablet (1 mg total) by mouth 2 (two) times daily as needed for up to 4 doses for anxiety. ?Patient taking differently: Take 1 mg by mouth 3 (three) times daily. 12/06/20   Antonieta Pert, MD  ?metFORMIN (GLUCOPHAGE) 500 MG tablet Take 500 mg by mouth 2 (two) times daily with a meal.     [provider]  ?metoprolol succinate (TOPROL-XL) 25 MG 24 hr tablet Take 25 mg by mouth daily.    [provider]  ?Multiple Vitamin (MULTI-VITAMIN) tablet Take 1 tablet by mouth daily.    [provider]  ?naloxone [facility]) nasal spray 4 mg/0.1 mL Place 0.4 mg into the nose once. 03/12/21   [provider]  ?oxyCODONE (ROXICODONE) 5 MG immediate release tablet Take 1 tablet (5 mg total) by mouth every 8 (eight) hours as needed. ?Patient not taking: Reported on 06/27/2021 06/11/21 06/11/22  Danny Hey, MD  ?pantoprazole (PROTONIX) 40 MG tablet Take 40 mg by mouth daily.    [provider]  ?PARoxetine (PAXIL) 20 MG tablet Take 1 tablet (20 mg total) by mouth 2 (two) times daily. ?Patient taking differently: Take 10 mg by mouth 2 (two) times daily. 12/06/20   Antonieta Pert, MD  ?phenytoin (DILANTIN) 100 MG ER capsule Take 100 mg by mouth 3 (three) times daily.  [provider]  ?rOPINIRole (REQUIP) 1 MG tablet Take 0.66m at 6pm and 171mat 9pm. ?Patient taking differently: Take 0.5 mg by mouth See admin instructions. Take 0.5 mg by mouth 6pm  and  1 mg at 10 pm 03/21/19   [name], DO  ?rosuvastatin (CRESTOR) 40 MG tablet TAKE ONE TABLET BY MOUTH EVERY NIGHT AT BEDTIME FOR HIGH CHOLESTEROL 06/20/21   [provider]  ?thiamine 100 MG tablet Take 1 tablet (100 mg total) by mouth daily. ?Patient not taking: Reported on 06/27/2021 12/07/20   KcAntonieta PertMD  ?zinc gluconate 50 MG tablet Take 50 mg by mouth daily.    [provider]   ?   ? ?Allergies    ?Gabapentin   ? ?Review of Systems   ?Review of Systems  ?Constitutional:  Negative for chills and fever.  ?Respiratory:  Negative for shortness of breath.   ?Cardiovascular:  Negative for chest pain.  ?Gastrointestinal:  Negative for abdominal pain, nausea and vomiting.  ?Neurological:  Negative for headaches.  ?All other systems reviewed and are negative. ? ?Physical Exam ?Updated Vital Signs ?BP (!) 133/97 (BP Location: Left Arm)   Pulse 87   Temp 97.9 ?F (36.6 ?C) (Oral)   Resp 14   Ht 5' 5" (1.651 m)   Wt 88.5 kg   SpO2 97%   BMI 32.45 kg/m?  ?Physical Exam ?Vitals and nursing note reviewed.  ?Constitutional:   ?   General: He is not in acute distress. ?   Appearance: Normal appearance. He is not toxic-appearing.  ?Cardiovascular:  ?   Rate and Rhythm: Normal rate and regular rhythm.  ?   Pulses: Normal pulses.  ?Pulmonary:  ?   Effort: Pulmonary effort is normal.  ?Abdominal:  ?   Palpations: Abdomen is soft.  ?   Tenderness: There is no abdominal tenderness.  ?Musculoskeletal:     ?   General: Normal range of motion.  ?Skin: ?   General: Skin is warm.  ?   Capillary Refill: Capillary refill takes less than 2 seconds.  ?   Findings: No rash.  ?Neurological:  ?   Mental Status: He is alert.  ?   Sensory: No sensory deficit.  ?   Motor: No weakness.  ? ? ?ED Results / Procedures / Treatments   ?Labs ?(all labs ordered are listed, but only abnormal results are displayed) ?Labs Reviewed  ?CBC WITH DIFFERENTIAL/PLATELET - Abnormal; Notable for the following components:  ?    Result Value  ? RBC 4.10 (*)   ? Hemoglobin 12.8 (*)   ? HCT 37.2 (*)   ? All other components within normal limits  ?BASIC METABOLIC PANEL - Abnormal; Notable for the following components:  ? Glucose, Bld 129 (*)   ? All other components within normal limits  ? ? ?EKG ?None ? ?Radiology ?No results found. ? ?Procedures ?Procedures  ? ? ?Medications Ordered in ED ?Medications - No data to display ? ?ED Course/  Medical Decision Making/ A&P ?  ?                        ?Medical Decision Making ?Amount and/or Complexity of Data Reviewed ?Labs: ordered. ?Radiology: ordered. ? ?Risk ?Prescription drug management. ?Decision regarding hospitalization. ? ? ?Patient here for evaluation of clogged PICC line.  He is receiving IV antibiotics to 8 hours.  Last dose was this morning.  Line was flushed after treatment completed met significant  resistance.  PICC line nurse evaluated this afternoon and tried Cathflo but was unable to flush the line.  Advised to come to the emergency department for further evaluation. ? ?Patient denies complaints, vital signs reassuring although he is mildly hypertensive.  Spoke with on-call PICC line nurse who recommended chest x-ray for confirmation of placement and to try Cathflo again.  Will replace PICC line tomorrow morning ? ?On recheck, Cathflo was administered and patient observed for 2 hours.  Attempted to flush PICC line was unsuccessful. ? ?Discussed findings with [facility] hospitalist, Dr. Myna Hidalgo who is agreeable to admit ? ? ? ? ? ? ? ?Final Clinical Impression(s) / ED Diagnoses ?Final diagnoses:  ?Occluded PICC line, initial encounter ([place])  ? ? ?Rx / DC Orders ?ED Discharge Orders   ? ? None  ? ?  ? ? ?  ?Kem Parkinson, PA-C ?07/01/21 2355 ? ?  ?Godfrey Pick, MD ?07/03/21 0201 ? ?

## 2021-07-01 NOTE — H&P (Signed)
?History and Physical  ? ? ?Danny Hampton UEA:[id number] DOB: 1955-08-17 DOA: 07/01/2021 ? ?PCP: System, Provider Not In  ? ?Patient coming from: Home  ? ?Chief Complaint: PICC not flushing  ? ?HPI: Danny Hampton is a pleasant 66 y.o. adult with medical history significant for myotonia congenita, type 2 diabetes mellitus, hypertension, and spinal surgery in August 4782 complicated by surgical site infection and MSSA bacteremia, currently under treatment with IV Ancef at home, now presenting to the emergency department due to his PICC not flushing.  Patient is currently on week 3 of 6 of Ancef, was able to administer a dose this morning at 7 AM, but the subsequent dose could not be administered, line could not be flushed, and home health RN attempted resolving the issue with alteplase but this was unsuccessful and the patient was directed to the ED.  He reports continued drainage from his wound, continues to have some pain, but no fevers or chills. ? ?ED Course: Upon arrival to the ED, patient is found to be afebrile and saturating well on room air with stable blood pressure.  Chest x-ray demonstrates right PICC with tip near the confluence of right brachiocephalic and superior vena cava.  Attempts were made to flush the catheter after indwelling alteplase, but this was unsuccessful in the ED.  ED PA consulted with the PICC RN who indicated that they would come in to replace the line in the morning.  The patient was given Ancef through a peripheral IV. ? ?Review of Systems:  ?All other systems reviewed and apart from HPI, are negative. ? ?Past Medical History:  ?Diagnosis Date  ? Acid reflux   ? Anxiety   ? Arrhythmia   ? paroxysmal supreventricular tachycardia  ? Arthritis   ? Complication of anesthesia   ? Myotonia congenita  ? Dysrhythmia   ? Essential hypertension   ? Family history of adverse reaction to anesthesia   ? Sister  ? Hardware complicating wound infection ([place]) 04/11/2021  ? Hernia, abdominal 06/05/2021  ?  High cholesterol   ? History of skin cancer   ? Myotonia congenita   ? Type 2 diabetes mellitus ([place])   ? ? ?Past Surgical History:  ?Procedure Laterality Date  ? APPLICATION OF WOUND VAC  11/27/2020  ? Procedure: APPLICATION OF WOUND VAC;  Surgeon: Kristeen Miss, MD;  Location: [facility];  Service: Neurosurgery;;  ? BACK SURGERY    ? times 2  ? CARPAL TUNNEL RELEASE    ? CATARACT EXTRACTION W/PHACO Left 01/31/2019  ? Procedure: CATARACT EXTRACTION PHACO AND INTRAOCULAR LENS PLACEMENT (IOC);  Surgeon: Baruch Goldmann, MD;  Location: [facility];  Service: Ophthalmology;  Laterality: Left;  CDE: 2.66  ? LUMBAR WOUND DEBRIDEMENT N/A 11/27/2020  ? Procedure: LUMBAR WOUND DEBRIDEMENT AND WASHOUT;  Surgeon: Kristeen Miss, MD;  Location: [facility];  Service: Neurosurgery;  Laterality: N/A;  ? LUMBAR WOUND DEBRIDEMENT N/A 05/22/2021  ? Procedure: Thoracic wound debridement with removal of hardware at Thoracic [place]-eleven;  Surgeon: Kary Kos, MD;  Location: [place];  Service: Neurosurgery;  Laterality: N/A;  ? NOSE SURGERY    ? TEE WITHOUT CARDIOVERSION N/A 12/03/2020  ? Procedure: TRANSESOPHAGEAL ECHOCARDIOGRAM (TEE);  Surgeon: Jerline Pain, MD;  Location: [facility];  Service: Cardiovascular;  Laterality: N/A;  ? TOTAL KNEE ARTHROPLASTY Right 10/12/2018  ? Procedure: RIGHT TOTAL KNEE ARTHROPLASTY;  Surgeon: Garald Balding, MD;  Location: [facility];  Service: Orthopedics;  Laterality: Right;  ? ? ?Social History:  ? reports that  he quit smoking about 3 years ago. His smoking use included cigarettes. He has a 30.00 pack-year smoking history. He has never used smokeless tobacco. He reports current alcohol use. He reports current drug use. Drug: Marijuana. ? ?Allergies  ?Allergen Reactions  ? Gabapentin Anxiety  ? ? ?Family History  ?Problem Relation Age of Onset  ? Healthy Mother   ? Healthy Father   ? Cancer Father   ? ? ? ?Prior to Admission medications   ?Medication Sig Start Date End Date Taking? Authorizing Provider  ?acetaminophen  (TYLENOL) 325 MG tablet Take 2 tablets (650 mg total) by mouth every 6 (six) hours as needed for mild pain, headache or fever. 12/06/20  Yes Antonieta Pert, MD  ?Carbidopa-Levodopa ER 48.75-195 MG CPCR Take 2 tablets by mouth 4 (four) times daily.   Yes [provider]  ?ceFAZolin (ANCEF) IVPB Inject 2 g into the vein every 8 (eight) hours.   Yes [provider]  ?Cholecalciferol (VITAMIN D3) 125 MCG (5000 UT) TABS Take 5,000 Units by mouth daily.    Yes [provider]  ?hydrOXYzine (ATARAX) 25 MG tablet Take 25 mg by mouth in the morning, at noon, in the evening, and at bedtime.   Yes [provider]  ?lisinopril-hydrochlorothiazide (ZESTORETIC) 20-25 MG tablet Take 1 tablet by mouth daily.   Yes [provider]  ?loratadine (CLARITIN) 10 MG tablet Take 10 mg by mouth daily.   Yes [provider]  ?LORazepam (ATIVAN) 1 MG tablet Take 1 tablet (1 mg total) by mouth 2 (two) times daily as needed for up to 4 doses for anxiety. ?Patient taking differently: Take 1 mg by mouth 3 (three) times daily. 12/06/20  Yes Antonieta Pert, MD  ?meloxicam (MOBIC) 15 MG tablet Take 15 mg by mouth daily.   Yes [provider]  ?metFORMIN (GLUCOPHAGE) 500 MG tablet Take 500 mg by mouth 2 (two) times daily with a meal.    Yes [provider]  ?metoprolol succinate (TOPROL-XL) 25 MG 24 hr tablet Take 25 mg by mouth daily.   Yes [provider]  ?Multiple Vitamin (MULTI-VITAMIN) tablet Take 1 tablet by mouth daily.   Yes [provider]  ?pantoprazole (PROTONIX) 40 MG tablet Take 40 mg by mouth daily.   Yes [provider]  ?PARoxetine (PAXIL) 40 MG tablet Take 40 mg by mouth 2 (two) times daily.   Yes [provider]  ?phenytoin (DILANTIN) 100 MG ER capsule Take 100 mg by mouth 3 (three) times daily.   Yes [provider]  ?Potassium 99 MG TABS Take 1 tablet by mouth daily.   Yes [provider]  ?rOPINIRole (REQUIP) 1 MG  tablet Take 0.'5mg'$  at 6pm and '1mg'$  at 9pm. ?Patient taking differently: Take 0.5 mg by mouth See admin instructions. Take 0.5 mg by mouth 6pm  and  1 mg at 10 pm 03/21/19  Yes Patel, Arvin Collard K, DO  ?rosuvastatin (CRESTOR) 40 MG tablet Take 40 mg by mouth at bedtime. 06/20/21  Yes [provider]  ?zinc gluconate 50 MG tablet Take 50 mg by mouth daily.   Yes [provider]  ? ? ?Physical Exam: ?Vitals:  ? 07/01/21 1626 07/01/21 2135 07/02/21 0001 07/02/21 0110  ?BP:  (!) 148/100 (!) 146/85 (!) 141/97  ?Pulse:  71 83 68  ?Resp:  '17 17 18  '$ ?Temp:  97.7 ?F (36.5 ?C) 97.6 ?F (36.4 ?C) 98.2 ?F (36.8 ?C)  ?TempSrc:  Oral Oral   ?SpO2:  96% 98% 99%  ?Weight: 88.5 kg     ?Height: '5\' 5"'$  (1.651 m)     ? ? ?Constitutional: NAD, calm  ?Eyes: PERTLA, lids and conjunctivae normal ?ENMT: Mucous membranes are moist. Posterior pharynx clear of any exudate or lesions.   ?Neck: supple, no masses  ?Respiratory:  no wheezing, no crackles.    ?Cardiovascular: S1 & S2 heard, regular rate and rhythm. No extremity edema.   ?Abdomen: No distension, no tenderness, soft. Bowel sounds active.  ?Musculoskeletal: no clubbing / cyanosis. No joint deformity upper and lower extremities.   ?Skin: Erythema surrounding surgical incision along spine with serous drainage. Warm, dry, well-perfused. ?Neurologic: CN 2-12 grossly intact. Moving all extremities. Alert and oriented.  ?Psychiatric: Pleasant. Cooperative.  ? ? ?Labs and Imaging on Admission: I have personally reviewed following labs and imaging studies ? ?CBC: ?Recent Labs  ?Lab 07/01/21 ?2134  ?WBC 5.7  ?NEUTROABS 3.0  ?HGB 12.8*  ?HCT 37.2*  ?MCV 90.7  ?PLT 178  ? ?Basic Metabolic Panel: ?Recent Labs  ?Lab 07/01/21 ?2134  ?NA 137  ?K 4.5  ?CL 99  ?CO2 28  ?GLUCOSE 129*  ?BUN 12  ?CREATININE 0.70  ?CALCIUM 9.6  ? ?GFR: ?Estimated Creatinine Clearance (by C-G formula based on SCr of 0.7 mg/dL) ?Male: 77 mL/min ?Male: 94.1 mL/min ?Liver Function Tests: ?No results for input(s):  AST, ALT, ALKPHOS, BILITOT, PROT, ALBUMIN in the last 168 hours. ?No results for input(s): LIPASE, AMYLASE in the last 168 hours. ?No results for input(s): AMMONIA in the last 168 hours. ?Coagulation Profi

## 2021-07-02 ENCOUNTER — Observation Stay: Payer: Self-pay

## 2021-07-02 DIAGNOSIS — T82898A Other specified complication of vascular prosthetic devices, implants and grafts, initial encounter: Secondary | ICD-10-CM | POA: Diagnosis not present

## 2021-07-02 LAB — GLUCOSE, CAPILLARY
Glucose-Capillary: 221 mg/dL — ABNORMAL HIGH (ref 70–99)
Glucose-Capillary: 115 mg/dL — ABNORMAL HIGH (ref 70–99)

## 2021-07-02 MED ORDER — OXYCODONE HCL 5 MG PO TABS
5.0000 mg | ORAL_TABLET | ORAL | Status: DC | PRN
  Administered 2021-07-02 (×3): 5 mg via ORAL
  Filled 2021-07-02 (×3): qty 1

## 2021-07-02 MED ORDER — OXYCODONE HCL 5 MG PO TABS: 5.0000 mg | ORAL_TABLET | Freq: Three times a day (TID) | ORAL | 0 refills | Status: AC | PRN

## 2021-07-02 MED ORDER — CHLORHEXIDINE GLUCONATE CLOTH 2 % EX PADS
6.0000 | MEDICATED_PAD | Freq: Every day | CUTANEOUS | Status: DC
  Administered 2021-07-02: 6 via TOPICAL

## 2021-07-02 NOTE — Discharge Summary (Signed)
Physician Discharge Summary  ?Danny Hampton IRS:[id number] DOB: [birth year] DOA: 07/01/2021 ? ?PCP: System, Provider Not In ? ?Admit date: 07/01/2021 ?Discharge date: 07/02/2021 ? ?Admitted From: Home ?Disposition:  Home ? ?Recommendations for Outpatient Follow-up:  ?Follow up with infectious disease and neurosurgery  ? ?Discharge Condition: stable ?CODE STATUS: full  ?Diet recommendation:  ?Diet Orders (From admission, onward)  ? ?  Start     Ordered  ? 07/02/21 0000  Diet - low sodium heart healthy       ? 07/02/21 0900  ? 07/01/21 2349  Diet Heart Room service appropriate? Yes; Fluid consistency: Thin  Diet effective now       ?Question Answer Comment  ?Room service appropriate? Yes   ?Fluid consistency: Thin   ?  ? 07/01/21 2349  ? ?  ?  ? ?  ? ?Brief/Interim Summary: ?From H&P by Dr. Myna Hidalgo: "Danny Hampton is a pleasant 66 y.o. adult with medical history significant for myotonia congenita, type 2 diabetes mellitus, hypertension, and spinal surgery in August 0093 complicated by surgical site infection and MSSA bacteremia, currently under treatment with IV Ancef at home, now presenting to the emergency department due to his PICC not flushing.  Patient is currently on week 3 of 6 of Ancef, was able to administer a dose this morning at 7 AM, but the subsequent dose could not be administered, line could not be flushed, and home health RN attempted resolving the issue with alteplase but this was unsuccessful and the patient was directed to the ED.  He reports continued drainage from his wound, continues to have some pain, but no fevers or chills. ?  ?ED Course: Upon arrival to the ED, patient is found to be afebrile and saturating well on room air with stable blood pressure.  Chest x-ray demonstrates right PICC with tip near the confluence of right brachiocephalic and superior vena cava.  Attempts were made to flush the catheter after indwelling alteplase, but this was unsuccessful in the ED.  ED PA consulted with the PICC  RN who indicated that they would come in to replace the line in the morning.  The patient was given Ancef through a peripheral IV." ? ?Subjective on day of discharge: Patient with chronic back pain, chronic wound/surgical site infection. PICC line to be replaced prior to discharge home and to continue home IV infusion as per previous discharge.  ? ?Discharge Diagnoses:  ? ?Principal Problem: ?  Occluded PICC line, initial encounter ([place]) ?Active Problems: ?  Postoperative wound infection ?  Controlled type 2 diabetes mellitus without complication, without long-term current use of insulin ([place]) ?  Primary hypertension ?  Anxiety and depression ?  Myotonia congenita, dominant form ([place]'s disease) ?  Occluded PICC line ([place]) ? ? ? ?Discharge Instructions ? ?Discharge Instructions   ? ? Call MD for:  difficulty breathing, headache or visual disturbances   Complete by: As directed ?  ? Call MD for:  extreme fatigue   Complete by: As directed ?  ? Call MD for:  hives   Complete by: As directed ?  ? Call MD for:  persistant dizziness or light-headedness   Complete by: As directed ?  ? Call MD for:  persistant nausea and vomiting   Complete by: As directed ?  ? Call MD for:  severe uncontrolled pain   Complete by: As directed ?  ? Call MD for:  temperature >100.4   Complete by: As directed ?  ? Diet - low sodium  heart healthy   Complete by: As directed ?  ? Discharge instructions   Complete by: As directed ?  ? You were cared for by a hospitalist during your hospital stay. If you have any questions about your discharge medications or the care you received while you were in the hospital after you are discharged, you can call the unit and ask to speak with the hospitalist on call if the hospitalist that took care of you is not available. Once you are discharged, your primary care physician will handle any further medical issues. Please note that NO REFILLS for any discharge medications will be authorized once you are  discharged, as it is imperative that you return to your primary care physician (or establish a relationship with a primary care physician if you do not have one) for your aftercare needs so that they can reassess your need for medications and monitor your lab values.  ? Face-to-face encounter (required for Medicare/Medicaid patients)   Complete by: As directed ?  ? I Dessa Phi certify that this patient is under my care and that I, or a nurse practitioner or physician's assistant working with me, had a face-to-face encounter that meets the physician face-to-face encounter requirements with this patient on 07/02/2021. The encounter with the patient was in whole, or in part for the following medical condition(s) which is the primary reason for home health care (List medical condition): surgical site infection  ? The encounter with the patient was in whole, or in part, for the following medical condition, which is the primary reason for home health care: surgical site infection  ? I certify that, based on my findings, the following services are medically necessary home health services: Nursing  ? Reason for Medically Necessary Home Health Services:  Skilled Nursing- Assessment and Observation of Wound Status ?Skilled Nursing- Assessment and Training for Infusion Therapy, Line Care, and Infection Control  ?  ? My clinical findings support the need for the above services: Unable to leave home safely without assistance and/or assistive device  ? Further, I certify that my clinical findings support that this patient is homebound due to: Unable to leave home safely without assistance  ? Home Health   Complete by: As directed ?  ? To provide the following care/treatments: RN  ? Increase activity slowly   Complete by: As directed ?  ? ?  ? ?Allergies as of 07/02/2021   ? ?   Reactions  ? Gabapentin Anxiety  ? ?  ? ?  ?Medication List  ?  ? ?TAKE these medications   ? ?acetaminophen 325 MG tablet ?Commonly known as:  TYLENOL ?Take 2 tablets (650 mg total) by mouth every 6 (six) hours as needed for mild pain, headache or fever. ?  ?Carbidopa-Levodopa ER 48.75-195 MG Cpcr ?Take 2 tablets by mouth 4 (four) times daily. ?  ?ceFAZolin  IVPB ?Commonly known as: ANCEF ?Inject 2 g into the vein every 8 (eight) hours. ?  ?hydrOXYzine 25 MG tablet ?Commonly known as: ATARAX ?Take 25 mg by mouth in the morning, at noon, in the evening, and at bedtime. ?  ?lisinopril-hydrochlorothiazide 20-25 MG tablet ?Commonly known as: ZESTORETIC ?Take 1 tablet by mouth daily. ?  ?loratadine 10 MG tablet ?Commonly known as: CLARITIN ?Take 10 mg by mouth daily. ?  ?LORazepam 1 MG tablet ?Commonly known as: ATIVAN ?Take 1 tablet (1 mg total) by mouth 2 (two) times daily as needed for up to 4 doses for anxiety. ?What changed: when  to take this ?  ?meloxicam 15 MG tablet ?Commonly known as: MOBIC ?Take 15 mg by mouth daily. ?  ?metFORMIN 500 MG tablet ?Commonly known as: GLUCOPHAGE ?Take 500 mg by mouth 2 (two) times daily with a meal. ?  ?metoprolol succinate 25 MG 24 hr tablet ?Commonly known as: TOPROL-XL ?Take 25 mg by mouth daily. ?  ?Multi-Vitamin tablet ?Take 1 tablet by mouth daily. ?  ?oxyCODONE 5 MG immediate release tablet ?Commonly known as: Roxicodone ?Take 1 tablet (5 mg total) by mouth every 8 (eight) hours as needed for up to 7 days. ?  ?pantoprazole 40 MG tablet ?Commonly known as: PROTONIX ?Take 40 mg by mouth daily. ?  ?PARoxetine 40 MG tablet ?Commonly known as: PAXIL ?Take 40 mg by mouth 2 (two) times daily. ?  ?phenytoin 100 MG ER capsule ?Commonly known as: DILANTIN ?Take 100 mg by mouth 3 (three) times daily. ?  ?Potassium 99 MG Tabs ?Take 1 tablet by mouth daily. ?  ?rOPINIRole 1 MG tablet ?Commonly known as: REQUIP ?Take 0.'5mg'$  at 6pm and '1mg'$  at 9pm. ?What changed:  ?how much to take ?how to take this ?when to take this ?additional instructions ?  ?rosuvastatin 40 MG tablet ?Commonly known as: CRESTOR ?Take 40 mg by mouth at  bedtime. ?  ?Vitamin D3 125 MCG (5000 UT) Tabs ?Take 5,000 Units by mouth daily. ?  ?zinc gluconate 50 MG tablet ?Take 50 mg by mouth daily. ?  ? ?  ? ? Follow-up Information   ? ? [name], Lavell Islam, MD Follow up.   ?Sp

## 2021-07-02 NOTE — Progress Notes (Signed)
Peripherally Inserted Central Catheter Placement ? ?The IV Nurse has discussed with the patient and/or persons authorized to consent for the patient, the purpose of this procedure and the potential benefits and risks involved with this procedure.  The benefits include less needle sticks, lab draws from the catheter, and the patient may be discharged home with the catheter. Risks include, but not limited to, infection, bleeding, blood clot (thrombus formation), and puncture of an artery; nerve damage and irregular heartbeat and possibility to perform a PICC exchange if needed/ordered by physician.  Alternatives to this procedure were also discussed.  Bard Power PICC patient education guide, fact sheet on infection prevention and patient information card has been provided to patient /or left at bedside.   ? ?PICC Placement Documentation  ?PICC Single Lumen 94/17/40 Right Basilic 40 cm 2 cm (Active)  ?Indication for Insertion or Continuance of Line Home intravenous therapies (PICC only);Prolonged intravenous therapies 07/02/21 1555  ?Exposed Catheter (cm) 2 cm 07/02/21 1555  ?Site Assessment Clean, Dry, Intact 07/02/21 1555  ?Line Status Flushed;Saline locked;Blood return noted 07/02/21 1555  ?Dressing Type Securing device;Transparent 07/02/21 1555  ?Dressing Status Antimicrobial disc in place;Clean, Dry, Intact 07/02/21 1555  ?Dressing Intervention New dressing 07/02/21 1555  ?Dressing Change Due 07/09/21 07/02/21 1555  ? ? ?PICC exchanged successfully with no complications noted. Patient given new ID card/guide for new PICC.  ? ? ?Shon Hale ?07/02/2021, 4:14 PM ? ?

## 2021-07-02 NOTE — TOC Progression Note (Signed)
Transition of Care (TOC) - Progression Note  ? ? ?Patient Details  ?Name: Danny Hampton ?MRN: [id number] ?Date of Birth: 02-Jul-1955 ? ?Transition of Care (TOC) CM/SW Contact  ?Salome Arnt, LCSW ?Phone Number: ?07/02/2021, 9:02 AM ? ?Clinical Narrative:  [place] notification completed. Notification ID: [id number].   ? ? ? ?  ?  ? ?Expected Discharge Plan and Services ?  ?  ?  ?  ?  ?Expected Discharge Date: 07/02/21               ?  ?  ?  ?  ?  ?  ?  ?  ?  ?  ? ? ?Social Determinants of Health (SDOH) Interventions ?  ? ?Readmission Risk Interventions ?   ? View : No data to display.  ?  ?  ?  ? ? ?

## 2021-07-02 NOTE — TOC Transition Note (Signed)
Transition of Care (TOC) - CM/SW Discharge Note ? ? ?Patient Details  ?Name: Danny Hampton ?MRN: [id number] ?Date of Birth: 03/08/1956 ? ?Transition of Care (TOC) CM/SW Contact:  ?Salome Arnt, LCSW ?Phone Number: ?07/02/2021, 9:27 AM ? ? ?Clinical Narrative:  Pt d/c today. He reports he is active with [facility] for [facility] due to IV antibiotics. Lattie Haw with [facility] notified of d/c. No other needs reported at this time.   ? ? ? ?Final next level of care: [place] ?Barriers to Discharge: Barriers Resolved ? ? ?Patient Goals and CMS Choice ?  ?  ?Choice offered to / list presented to : Patient ? ?Discharge Placement ?  ?           ?  ?  ?  ?Patient and family notified of of transfer: 07/02/21 ? ?Discharge Plan and Services ?  ?  ?           ?  ?  ?  ?  ?  ?HH Arranged: RN ?[place] Agency: [place] ?Date HH Agency Contacted: 07/02/21 ?[facility] [place]: [phone] ?Representative spoke with at [place]: Lattie Haw ? ?Social Determinants of Health (SDOH) Interventions ?  ? ? ?Readmission Risk Interventions ?   ? View : No data to display.  ?  ?  ?  ? ? ? ? ? ?

## 2021-08-01 ENCOUNTER — Ambulatory Visit: Payer: No Typology Code available for payment source | Admitting: Infectious Disease

## 2021-08-06 ENCOUNTER — Telehealth: Payer: Self-pay

## 2021-08-06 NOTE — Telephone Encounter (Signed)
Benjamine Mola with [facility] [address] [phone]) called needing verbal orders to remove picc line for the patient. Per Benjamine Mola they were advised to leave the picc line 2 weeks after completion of IV antibiotics on 07/25/21. Patient canceled follow up with Dr. [name] on 08/01/21.  Patient stated he will no longer be coming to [facility] for care and will he will be going to the [place] [facility]. Verbal orders given to [facility] to remove the picc for the patient.  Tyce Delcid T Brooks Sailors

## 2022-05-03 IMAGING — DX DG CHEST 1V PORT
1 series · 1 of 1 positions shown · non-contrast
Comparison: [DATE]

CLINICAL DATA: Cough and shortness of breath.  Weakness.

EXAM:
PORTABLE CHEST 1 VIEW

[chest ap]
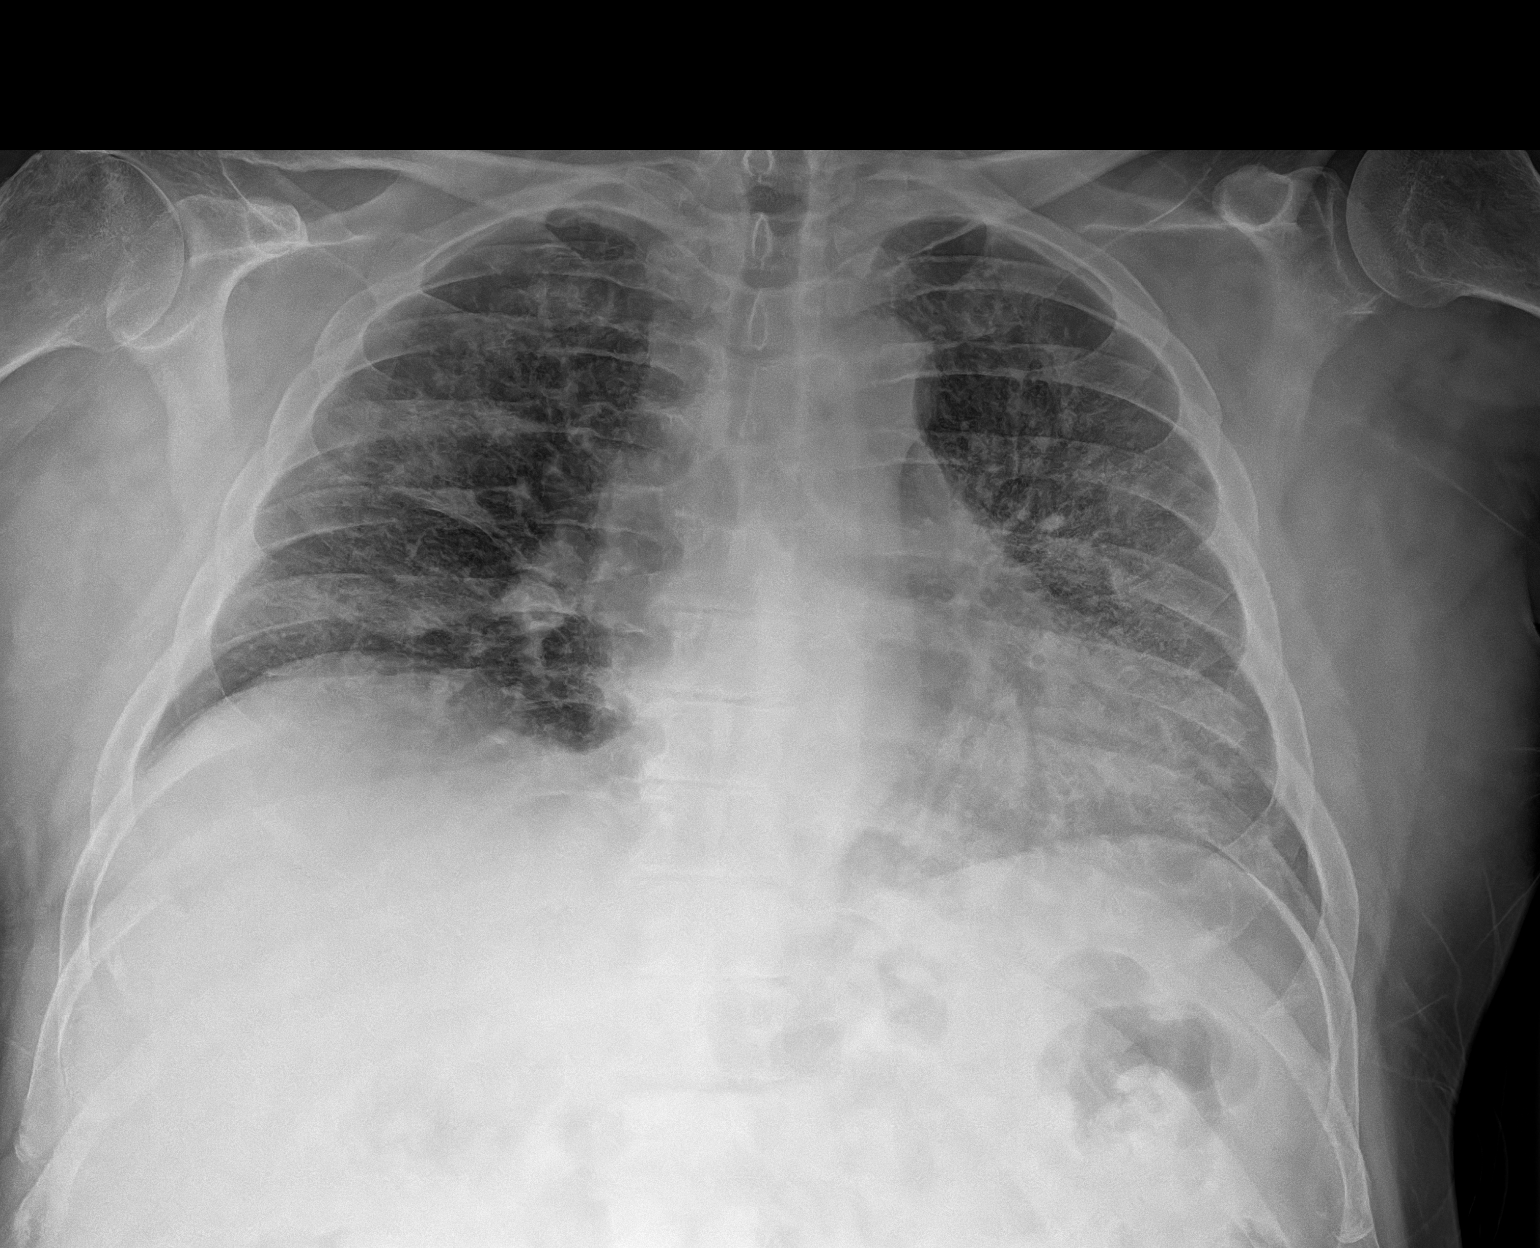

[1 of 1 positions shown; findings below may reference images not displayed]

FINDINGS: Lung volumes are low. Patchy heterogeneous bilateral airspace
opacities in a peripheral predominant distribution. Normal heart
size for technique. Unchanged mediastinal contours. No pneumothorax
or large pleural effusion. No evidence of pneumomediastinum. No
acute osseous abnormalities are seen.
IMPRESSION: Patchy heterogeneous bilateral airspace opacities in a peripheral
predominant distribution, suspicious for multifocal pneumonia,
pattern often seen with COVID 19.

## 2022-11-06 NOTE — Progress Notes (Unsigned)
NEUROLOGY CONSULTATION NOTE  Danny Hampton MRN: [id number] DOB: 1955-09-16  Referring provider: *** Primary care provider: ***  Reason for consult:  Concern for dementia  Assessment/Plan:   ***  MRI of brain without contrast to evaluate for *** Check B1, B12, TSH Neuropsychological evaluation Further recommendations pending results.     Subjective:  Danny Hampton is a 67 year old right-handed male with HTN, HLD, DM 2, myotonia congenita, RLS, depression, alcohol and tobacco abuse who presents for concern of dementia.  He is accompanied by his significant other, ***     ***  He is on multiple medications including Adderall for cognition, clonazepam 0.5mg  three times daily for anxiety, hydroxyzine 25mg  four times daily for anxiety, paroxetine carbidopa-levodopa 48.75/195mg  for RLS  PAST MEDICAL HISTORY: Past Medical History:  Diagnosis Date   Acid reflux    Anxiety    Arrhythmia    paroxysmal supreventricular tachycardia   Arthritis    Complication of anesthesia    Myotonia congenita   Dysrhythmia    Essential hypertension    Family history of adverse reaction to anesthesia    Sister   Hardware complicating wound infection (HCC) 04/11/2021   Hernia, abdominal 06/05/2021   High cholesterol    History of skin cancer    Myotonia congenita    Type 2 diabetes mellitus (HCC)     PAST SURGICAL HISTORY: Past Surgical History:  Procedure Laterality Date   APPLICATION OF WOUND VAC  11/27/2020   Procedure: APPLICATION OF WOUND VAC;  Surgeon: Barnett Abu, MD;  Location: [facility] OR;  Service: Neurosurgery;;   BACK SURGERY     times 2   CARPAL TUNNEL RELEASE     CATARACT EXTRACTION W/PHACO Left 01/31/2019   Procedure: CATARACT EXTRACTION PHACO AND INTRAOCULAR LENS PLACEMENT (IOC);  Surgeon: Fabio Pierce, MD;  Location: [facility] ORS;  Service: Ophthalmology;  Laterality: Left;  CDE: 2.66   LUMBAR WOUND DEBRIDEMENT N/A 11/27/2020   Procedure: LUMBAR WOUND DEBRIDEMENT AND WASHOUT;   Surgeon: Barnett Abu, MD;  Location: [facility] OR;  Service: Neurosurgery;  Laterality: N/A;   LUMBAR WOUND DEBRIDEMENT N/A 05/22/2021   Procedure: Thoracic wound debridement with removal of hardware at [place]acic [place];  Surgeon: Donalee Citrin, MD;  Location: [facility] OR;  Service: Neurosurgery;  Laterality: N/A;   NOSE SURGERY     TEE WITHOUT CARDIOVERSION N/A 12/03/2020   Procedure: TRANSESOPHAGEAL ECHOCARDIOGRAM (TEE);  Surgeon: Jake Bathe, MD;  Location: [facility] ENDOSCOPY;  Service: Cardiovascular;  Laterality: N/A;   TOTAL KNEE ARTHROPLASTY Right 10/12/2018   Procedure: RIGHT TOTAL KNEE ARTHROPLASTY;  Surgeon: Valeria Batman, MD;  Location: [facility] ORS;  Service: Orthopedics;  Laterality: Right;    MEDICATIONS: Current Outpatient Medications on File Prior to Visit  Medication Sig Dispense Refill   acetaminophen (TYLENOL) 325 MG tablet Take 2 tablets (650 mg total) by mouth every 6 (six) hours as needed for mild pain, headache or fever.     Carbidopa-Levodopa ER 48.75-195 MG CPCR Take 2 tablets by mouth 4 (four) times daily.     ceFAZolin (ANCEF) IVPB Inject 2 g into the vein every 8 (eight) hours.     Cholecalciferol (VITAMIN D3) 125 MCG (5000 UT) TABS Take 5,000 Units by mouth daily.      hydrOXYzine (ATARAX) 25 MG tablet Take 25 mg by mouth in the morning, at noon, in the evening, and at bedtime.     lisinopril-hydrochlorothiazide (ZESTORETIC) 20-25 MG tablet Take 1 tablet by mouth daily.     loratadine (CLARITIN) 10  MG tablet Take 10 mg by mouth daily.     LORazepam (ATIVAN) 1 MG tablet Take 1 tablet (1 mg total) by mouth 2 (two) times daily as needed for up to 4 doses for anxiety. (Patient taking differently: Take 1 mg by mouth 3 (three) times daily.) 4 tablet 0   meloxicam (MOBIC) 15 MG tablet Take 15 mg by mouth daily.     metFORMIN (GLUCOPHAGE) 500 MG tablet Take 500 mg by mouth 2 (two) times daily with a meal.      metoprolol succinate (TOPROL-XL) 25 MG 24 hr tablet Take 25 mg by mouth daily.      Multiple Vitamin (MULTI-VITAMIN) tablet Take 1 tablet by mouth daily.     pantoprazole (PROTONIX) 40 MG tablet Take 40 mg by mouth daily.     PARoxetine (PAXIL) 40 MG tablet Take 40 mg by mouth 2 (two) times daily.     phenytoin (DILANTIN) 100 MG ER capsule Take 100 mg by mouth 3 (three) times daily.     Potassium 99 MG TABS Take 1 tablet by mouth daily.     rOPINIRole (REQUIP) 1 MG tablet Take 0.5mg  at 6pm and 1mg  at 9pm. (Patient taking differently: Take 0.5 mg by mouth See admin instructions. Take 0.5 mg by mouth 6pm  and  1 mg at 10 pm) 135 tablet 3   rosuvastatin (CRESTOR) 40 MG tablet Take 40 mg by mouth at bedtime.     zinc gluconate 50 MG tablet Take 50 mg by mouth daily.     No current facility-administered medications on file prior to visit.    ALLERGIES: Allergies  Allergen Reactions   Gabapentin Anxiety    FAMILY HISTORY: Family History  Problem Relation Age of Onset   Healthy Mother    Healthy Father    Cancer Father     Objective:  *** General: No acute distress.  Patient appears well-groomed.   Head:  Normocephalic/atraumatic Eyes:  fundi examined but not visualized Neck: supple, no paraspinal tenderness, full range of motion Back: No paraspinal tenderness Heart: regular rate and rhythm Lungs: Clear to auscultation bilaterally. Vascular: No carotid bruits. Neurological Exam: Mental status: alert and oriented to person, place, and time, speech fluent and not dysarthric, language intact. Cranial nerves: CN I: not tested CN II: pupils equal, round and reactive to light, visual fields intact CN III, IV, VI:  full range of motion, no nystagmus, no ptosis CN V: facial sensation intact. CN VII: upper and lower face symmetric CN VIII: hearing intact CN IX, X: gag intact, uvula midline CN XI: sternocleidomastoid and trapezius muscles intact CN XII: tongue midline Bulk & Tone: normal, no fasciculations. Motor:  muscle strength 5/5 throughout Sensation:   Pinprick, temperature and vibratory sensation intact. Deep Tendon Reflexes:  2+ throughout,  toes downgoing.   Finger to nose testing:  Without dysmetria.   Heel to shin:  Without dysmetria.   Gait:  Normal station and stride.  Romberg negative.    Thank you for allowing me to take part in the care of this patient.  Shon Millet, DO  CC: ***

## 2022-11-11 ENCOUNTER — Ambulatory Visit (INDEPENDENT_AMBULATORY_CARE_PROVIDER_SITE_OTHER): Payer: No Typology Code available for payment source | Admitting: Neurology

## 2022-11-11 ENCOUNTER — Ambulatory Visit: Payer: No Typology Code available for payment source

## 2022-11-11 ENCOUNTER — Encounter: Payer: Self-pay | Admitting: Neurology

## 2022-11-11 ENCOUNTER — Other Ambulatory Visit (INDEPENDENT_AMBULATORY_CARE_PROVIDER_SITE_OTHER): Payer: No Typology Code available for payment source

## 2022-11-11 VITALS — BP 137/86 | HR 110 | Ht 64.0 in | Wt 177.0 lb

## 2022-11-11 DIAGNOSIS — R4189 Other symptoms and signs involving cognitive functions and awareness: Secondary | ICD-10-CM | POA: Diagnosis not present

## 2022-11-11 NOTE — Patient Instructions (Signed)
Check MRI of brain without contrast Check B1, B12, TSH, RPR Neuropsychological evaluation Follow up after testing.

## 2022-11-12 LAB — VITAMIN B12: Vitamin B-12: 413 pg/mL (ref 211–911)

## 2022-11-12 LAB — TSH: TSH: 4.17 u[IU]/mL (ref 0.35–5.50)

## 2022-11-14 LAB — RPR: RPR Ser Ql: NONREACTIVE

## 2022-11-14 LAB — VITAMIN B1: Vitamin B1 (Thiamine): 61 nmol/L — ABNORMAL HIGH (ref 8–30)

## 2022-11-24 ENCOUNTER — Ambulatory Visit (HOSPITAL_COMMUNITY)
Admission: RE | Admit: 2022-11-24 | Discharge: 2022-11-24 | Disposition: A | Discharge status: Home / Self Care | Payer: No Typology Code available for payment source | Source: Ambulatory Visit | Attending: Neurology | Admitting: Neurology

## 2022-11-24 DIAGNOSIS — R4189 Other symptoms and signs involving cognitive functions and awareness: Secondary | ICD-10-CM | POA: Diagnosis present

## 2022-11-27 ENCOUNTER — Telehealth: Payer: Self-pay

## 2022-11-27 NOTE — Telephone Encounter (Signed)
Advised patient and Girlfriend MRI results given on 11/25/22.

## 2023-02-18 ENCOUNTER — Encounter: Payer: Self-pay | Admitting: Psychology

## 2023-02-18 ENCOUNTER — Ambulatory Visit (INDEPENDENT_AMBULATORY_CARE_PROVIDER_SITE_OTHER): Payer: No Typology Code available for payment source | Admitting: Psychology

## 2023-02-18 ENCOUNTER — Ambulatory Visit: Payer: No Typology Code available for payment source

## 2023-02-18 DIAGNOSIS — D649 Anemia, unspecified: Secondary | ICD-10-CM | POA: Insufficient documentation

## 2023-02-18 DIAGNOSIS — I471 Supraventricular tachycardia, unspecified: Secondary | ICD-10-CM | POA: Insufficient documentation

## 2023-02-18 DIAGNOSIS — F329 Major depressive disorder, single episode, unspecified: Secondary | ICD-10-CM | POA: Insufficient documentation

## 2023-02-18 DIAGNOSIS — M51369 Other intervertebral disc degeneration, lumbar region without mention of lumbar back pain or lower extremity pain: Secondary | ICD-10-CM | POA: Insufficient documentation

## 2023-02-18 DIAGNOSIS — J309 Allergic rhinitis, unspecified: Secondary | ICD-10-CM | POA: Insufficient documentation

## 2023-02-18 DIAGNOSIS — M5416 Radiculopathy, lumbar region: Secondary | ICD-10-CM | POA: Insufficient documentation

## 2023-02-18 DIAGNOSIS — I679 Cerebrovascular disease, unspecified: Secondary | ICD-10-CM | POA: Insufficient documentation

## 2023-02-18 DIAGNOSIS — F0670 Mild neurocognitive disorder due to known physiological condition without behavioral disturbance: Secondary | ICD-10-CM | POA: Diagnosis not present

## 2023-02-18 DIAGNOSIS — R4189 Other symptoms and signs involving cognitive functions and awareness: Secondary | ICD-10-CM

## 2023-02-18 DIAGNOSIS — M47814 Spondylosis without myelopathy or radiculopathy, thoracic region: Secondary | ICD-10-CM | POA: Insufficient documentation

## 2023-02-18 DIAGNOSIS — M47817 Spondylosis without myelopathy or radiculopathy, lumbosacral region: Secondary | ICD-10-CM | POA: Insufficient documentation

## 2023-02-18 DIAGNOSIS — F1011 Alcohol abuse, in remission: Secondary | ICD-10-CM | POA: Insufficient documentation

## 2023-02-18 DIAGNOSIS — M47812 Spondylosis without myelopathy or radiculopathy, cervical region: Secondary | ICD-10-CM | POA: Insufficient documentation

## 2023-02-18 DIAGNOSIS — Z860101 Personal history of adenomatous and serrated colon polyps: Secondary | ICD-10-CM | POA: Insufficient documentation

## 2023-02-18 DIAGNOSIS — M25579 Pain in unspecified ankle and joints of unspecified foot: Secondary | ICD-10-CM | POA: Insufficient documentation

## 2023-02-18 DIAGNOSIS — L570 Actinic keratosis: Secondary | ICD-10-CM | POA: Insufficient documentation

## 2023-02-18 DIAGNOSIS — K2100 Gastro-esophageal reflux disease with esophagitis, without bleeding: Secondary | ICD-10-CM | POA: Insufficient documentation

## 2023-02-18 HISTORY — DX: Mild neurocognitive disorder due to known physiological condition without behavioral disturbance: F06.70

## 2023-02-18 NOTE — Progress Notes (Signed)
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 enough to where he will get up to check to make sure that nobody was actually there.  Delusional thoughts: Denied.  Tobacco: Denied. Alcohol: He reported ongoing sobriety for the past two months. Prior to this, there were alcohol abuse concerns per his medical records, with estimates suggesting 3-4 oz of bourbon five times weekly.  Recreational drugs: He reported daily marijuana consumption.   Family History: Problem Relation Age of Onset   Cancer Father    Parkinson's disease Father    Dementia Father    This information was confirmed by Mr. Porto.  Academic/Vocational History: Highest level of educational attainment: 12 years. He graduated from high school and described himself as an average (B/C) student in academic settings. No relative weaknesses were described.  History of developmental delay: Denied. History of grade repetition: Denied. Enrollment in special education courses: Denied. History of LD/ADHD:  Denied.  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 He has been on disability due to physical limitations since 2015.   Evaluation Results:   Behavioral Observations: Mr. Poarch was accompanied by his wife, arrived to his appointment on time, and was appropriately dressed and groomed. He appeared alert. Observed gait and station were within normal limits. Gross motor functioning appeared intact upon informal observation and no abnormal movements (e.g., tremors) were noted. His affect was generally relaxed and positive. Spontaneous speech was fluent and word finding difficulties were not observed during the clinical interview. Thought processes were coherent, organized, and normal in content. Insight into his cognitive difficulties appeared adequate.   During testing, sustained attention was appropriate. Task engagement was adequate and he persisted when challenged. He did appear to have a set loss during a semantic fluency task which impacted his overall performance. Overall, Mr. Rode was cooperative with the clinical interview and subsequent testing procedures.   Adequacy of Effort: The validity of neuropsychological testing is limited by the extent to which the individual being tested may be assumed to have exerted adequate effort during testing. Mr. Reel expressed his intention to perform to the best of his abilities and exhibited adequate task engagement and persistence. Scores across stand-alone and embedded performance validity measures were within expectation. As such, the results of the current evaluation are believed to  be a valid representation of Mr. Plucinski current cognitive functioning.  Test Results: Mr. Bangerter was oriented at the time of the current evaluation.  Intellectual abilities based upon educational and vocational attainment were estimated to be in the average range. Premorbid abilities were  estimated to be within the average range based upon a single-word reading test.   Processing speed was average to above average outside of a lower performance across a rapid decoding task (WAIS-IV Coding). Basic attention was average. More complex attention (e.g., working memory) was also average. Executive functioning was average to above average.  While not directly assessed, receptive language abilities were believed to be intact. Mr. Redder did not exhibit any difficulties comprehending task instructions and answered all questions asked of him appropriately. Assessed expressive language mildly variable. Phonemic fluency was average, semantic fluency was well below average to below average, and confrontation naming was above average.    Assessed visuospatial/visuoconstructional abilities were average.    Learning (i.e., encoding) of novel verbal and visual information was variable, ranging from the well below average to average normative ranges. Spontaneous delayed recall (i.e., retrieval) of previously learned information was also variable, ranging from the well below average to average normative ranges. Retention rates were 110% across a story learning task, 100% across a list learning task, and 83% across a shape learning task. Performance across recognition tasks was variable, ranging from the exceptionally low to average normative ranges, suggesting some evidence for information consolidation.   Results of emotional screening instruments suggested that recent symptoms of generalized anxiety were in the moderate range, while symptoms of depression were within the severe range. A screening instrument assessing recent sleep quality suggested the presence of moderate sleep dysfunction.  Tables of Scores:   Note: This summary of test scores accompanies the interpretive report and should not be considered in isolation without reference to the appropriate sections in the text. Descriptors are based  on appropriate normative data and may be adjusted based on clinical judgment. Terms such as "Within Normal Limits" and "Outside Normal Limits" are used when a more specific description of the test score cannot be determined.       Percentile - Normative Descriptor > 98 - Exceptionally High 91-97 - Well Above Average 75-90 - Above Average 25-74 - Average 9-24 - Below Average 2-8 - Well Below Average < 2 - Exceptionally Low       Validity:   DESCRIPTOR       ACS WC: --- --- Within Normal Limits  DCT: --- --- Within Normal Limits  NAB EVI: --- --- Within Normal Limits       Orientation:      Raw Score Percentile   NAB Orientation, Form 1 28/29 --- ---       Cognitive Screening:      Raw Score Percentile   SLUMS: 18/30 --- ---       Intellectual Functioning:      Standard Score Percentile   Test of Premorbid Functioning: 99 47 Average       Memory:     NAB Memory Module, Form 1: Standard Score/ T Score Percentile   Total Memory Index 79 8 Well Below Average  List Learning       Total Trials 1-3 11/36 (33) 5 Well Below Average    List B 2/12 (39) 14 Below Average    Short Delay Free Recall 3/12 (37) 9 Below Average    Long Delay Free Recall 3/12 (38) 12 Below Average  Retention Percentage 100 (51) 54 Average    Recognition Discriminability 4 (40) 16 Below Average  Shape Learning       Total Trials 1-3 15/27 (53) 62 Average    Delayed Recall 5/9 (50) 50 Average    Retention Percentage 83 (45) 31 Average    Recognition Discriminability 6 (47) 38 Average  Story Learning       Immediate Recall 41/80 (38) 12 Below Average    Delayed Recall 23/40 (41) 18 Below Average    Retention Percentage 110 (60) 84 Above Average  Daily Living Memory       Immediate Recall 36/51 (44) 27 Average    Delayed Recall 6/17 (29) 2 Well Below Average    Retention Percentage 40 (17) <1 Exceptionally Low    Recognition Hits 5/10 (20) <1 Exceptionally Low       Attention/Executive Function:      Trail Making Test (TMT): Raw Score (T Score) Percentile     Part A 32 secs.,  0 errors (53) 62 Average    Part B 88 secs.,  2 errors (53) 62 Average         Scaled Score Percentile   WAIS-IV Coding: 5 5 Well Below Average       NAB Attention Module, Form 1: T Score Percentile     Digits Forward 49 46 Average    Digits Backwards 51 54 Average        Scaled Score Percentile   WAIS-IV Similarities: 10 50 Average       D-KEFS Color-Word Interference Test: Raw Score (Scaled Score) Percentile     Color Naming 29 secs. (12) 75 Above Average    Word Reading 24 secs. (10) 50 Average    Inhibition 63 secs. (11) 63 Average      Total Errors 1 error (11) 63 Average    Inhibition/Switching 64 secs. (12) 75 Above Average      Total Errors 4 errors (9) 37 Average       D-KEFS Verbal Fluency Test: Raw Score (Scaled Score) Percentile     Letter Total Correct 31 (9) 37 Average    Category Total Correct 22 (5) 5 Well Below Average    Category Switching Total Correct 14 (12) 75 Above Average    Category Switching Accuracy 13 (12) 75 Above Average      Total Set Loss Errors 5 (6) 9 Below Average      Total Repetition Errors 0 (13) 84 Above Average       Language:     Verbal Fluency Test: Raw Score (T Score) Percentile     Phonemic Fluency (FAS) 31 (44) 27 Average    Animal Fluency 13 (39) 14 Below Average        NAB Language Module, Form 1: T Score Percentile     Naming 31/31 (57) 75 Above Average       Visuospatial/Visuoconstruction:      Raw Score Percentile   Clock Drawing: 10/10 --- Within Normal Limits       NAB Spatial Module, Form 1: T Score Percentile     Visual Discrimination 54 66 Average    Figure Drawing Copy 51 54 Average        Scaled Score Percentile   WAIS-IV Block Design: 11 63 Average       Mood and Personality:      Raw Score Percentile   Beck Depression Inventory - II: 34 --- Severe  PROMIS Anxiety Questionnaire: 22 --- Moderate  Additional  Questionnaires:      Raw Score Percentile   PROMIS Sleep Disturbance Questionnaire: 30 --- Moderate   Informed Consent and Coding/Compliance:   The current evaluation represents a clinical evaluation for the purposes previously outlined by the referral source and is in no way reflective of a forensic evaluation.   Mr. Encarnacion was provided with a verbal description of the nature and purpose of the present neuropsychological evaluation. Also reviewed were the foreseeable risks and/or discomforts and benefits of the procedure, limits of confidentiality, and mandatory reporting requirements of this provider. The patient was given the opportunity to ask questions and receive answers about the evaluation. Oral consent to participate was provided by the patient.   This evaluation was conducted by Newman Nickels, Ph.D., ABPP-CN, board certified clinical neuropsychologist. Mr. Einstein completed a clinical interview with Dr. Milbert Coulter, billed as one unit [phone], and 135 minutes of cognitive testing and scoring, billed as one unit [phone] and four additional units [id number]. Psychometrist Shan Levans, B.S. assisted Dr. Milbert Coulter with test administration and scoring procedures. As a separate and discrete service, one unit [id number] and two units [phone] were billed for Dr. Tammy Sours time spent in interpretation and report writing.

## 2023-02-18 NOTE — Progress Notes (Signed)
   Psychometrician Note   Cognitive testing was administered to Jerilee Hoh by Shan Levans, B.S. (psychometrist) under the supervision of Dr. Newman Nickels, Ph.D., licensed psychologist on 02/18/2023. Mr. Exline did not appear overtly distressed by the testing session per behavioral observation or responses across self-report questionnaires. Rest breaks were offered.    The battery of tests administered was selected by Dr. Newman Nickels, Ph.D. with consideration to Mr. Myer's current level of functioning, the nature of his symptoms, emotional and behavioral responses during interview, level of literacy, observed level of motivation/effort, and the nature of the referral question. This battery was communicated to the psychometrist. Communication between Dr. Newman Nickels, Ph.D. and the psychometrist was ongoing throughout the evaluation and Dr. Newman Nickels, Ph.D. was immediately accessible at all times. Dr. Newman Nickels, Ph.D. provided supervision to the psychometrist on the date of this service to the extent necessary to assure the quality of all services provided.    Gloyd Dardar will return within approximately 1-2 weeks for an interactive feedback session with Dr. Milbert Coulter at which time his test performances, clinical impressions, and treatment recommendations will be reviewed in detail. Mr. Brock understands he can contact our office should he require our assistance before this time.  A total of 135 minutes of billable time were spent face-to-face with Mr. Jamal by the psychometrist. This includes both test administration and scoring time. Billing for these services is reflected in the clinical report generated by Dr. Newman Nickels, Ph.D.  This note reflects time spent with the psychometrician and does not include test scores or any clinical interpretations made by Dr. Milbert Coulter. The full report will follow in a separate note.

## 2023-02-25 ENCOUNTER — Encounter: Payer: No Typology Code available for payment source | Admitting: Psychology

## 2023-03-18 ENCOUNTER — Ambulatory Visit: Payer: No Typology Code available for payment source | Admitting: Psychology

## 2023-03-18 DIAGNOSIS — I679 Cerebrovascular disease, unspecified: Secondary | ICD-10-CM

## 2023-03-18 DIAGNOSIS — F0670 Mild neurocognitive disorder due to known physiological condition without behavioral disturbance: Secondary | ICD-10-CM

## 2023-03-18 NOTE — Progress Notes (Signed)
   Neuropsychology Feedback Session Danny Hampton. [facility] Department of Neurology  Reason for Referral:   Danny Hampton is a 68 y.o. right-handed Caucasian male referred by Danny Hampton, D.O., to characterize his current cognitive functioning and assist with diagnostic clarity and treatment planning in the context of subjective cognitive decline and numerous medical and psychiatric comorbidities.   Feedback:   Danny Hampton completed a comprehensive neuropsychological evaluation on 02/18/2023. Please refer to that encounter for the full report and recommendations. Briefly, results suggested a relative weakness across semantic fluency and performance variability across all aspects of learning and memory. No cognitive domain exhibited consistent impairment. While he exhibited an isolated weakness across a rapid decoding task, all other tests assessing processing speed were strong. Specific to memory, current testing would suggest greater difficulty learning new information efficiently. While he did exhibit only 40% retention after a brief delay across a daily living memory task, retention rates ranged from 83% to 110% across all other memory tasks. This suggests while he may have trouble learning new information, he is able to hang onto what he learns reasonably well. Current testing does not offer any objective evidence to support claims of rapid forgetting and I do not have strong concerns for underlying Alzheimer's disease at the present time. The cause for day-to-day subjective dysfunction and sporadic but relatively benign weaknesses across testing is multifactorial in nature. Recent neuroimaging suggested severe microvascular ischemic disease, certainly to the extent that this could create and maintain some memory inefficiencies. His pattern of memory dysfunction certainly aligns with a primary vascular etiology. In addition to this, he has a myriad of medical/cardiovascular medical  comorbidities, severe chronic pain, frequent if not daily headache experiences, frequent if not daily bouts of dizziness, prominent sleep dysfunction (with concerns for obstructive sleep apnea), a history of alcohol  abuse (with sobriety over the prior two months), daily marijuana use, and polypharmacy. Across mood-related questionnaires, he additionally reported moderate to severe symptoms of anxiety and depression respectively. All of these conditions, whether in isolation or especially when combined, can certainly explain testing results and all of Danny Hampton day-to-day reporting. This combination of variables represents the most likely culprit for subjective decline.   Danny Hampton was accompanied by his wife during the current telephone call. He was within his residence while I was within my office. I discussed the limitations of evaluation and management by telemedicine and the availability of in person appointments. Danny Hampton expressed his understanding and agreed to proceed. Content of the current session focused on the results of his neuropsychological evaluation. Danny Hampton was given the opportunity to ask questions and his questions were answered. He was encouraged to reach out should additional questions arise. A copy of his report had been mailed at the conclusion of his previous visit. He confirmed receipt today.      One unit [phone] was billed for Danny Hampton time (32 minutes) spent preparing for, conducting, and documenting the current feedback session with [name].

## 2023-05-12 ENCOUNTER — Ambulatory Visit: Payer: No Typology Code available for payment source | Admitting: Neurology
